# Patient Record
Sex: Male | Born: 1935 | Race: White | Hispanic: No | Marital: Married | Smoking: Former smoker
Health system: Southern US, Community
[De-identification: ages and names within clinical notes are randomized; demographics above are authoritative.]

## PROBLEM LIST (undated history)

## (undated) ENCOUNTER — Emergency Department (HOSPITAL_COMMUNITY): Admission: EM | Payer: Medicare Other | Source: Home / Self Care

## (undated) DIAGNOSIS — Z860100 Personal history of colon polyps, unspecified: Secondary | ICD-10-CM

## (undated) DIAGNOSIS — K219 Gastro-esophageal reflux disease without esophagitis: Secondary | ICD-10-CM

## (undated) DIAGNOSIS — F419 Anxiety disorder, unspecified: Secondary | ICD-10-CM

## (undated) DIAGNOSIS — I10 Essential (primary) hypertension: Secondary | ICD-10-CM

## (undated) DIAGNOSIS — J189 Pneumonia, unspecified organism: Secondary | ICD-10-CM

## (undated) DIAGNOSIS — F329 Major depressive disorder, single episode, unspecified: Secondary | ICD-10-CM

## (undated) DIAGNOSIS — Z973 Presence of spectacles and contact lenses: Secondary | ICD-10-CM

## (undated) DIAGNOSIS — N135 Crossing vessel and stricture of ureter without hydronephrosis: Secondary | ICD-10-CM

## (undated) DIAGNOSIS — J42 Unspecified chronic bronchitis: Secondary | ICD-10-CM

## (undated) DIAGNOSIS — M4800 Spinal stenosis, site unspecified: Secondary | ICD-10-CM

## (undated) DIAGNOSIS — H9190 Unspecified hearing loss, unspecified ear: Secondary | ICD-10-CM

## (undated) DIAGNOSIS — G629 Polyneuropathy, unspecified: Secondary | ICD-10-CM

## (undated) DIAGNOSIS — R0600 Dyspnea, unspecified: Secondary | ICD-10-CM

## (undated) DIAGNOSIS — Z9289 Personal history of other medical treatment: Secondary | ICD-10-CM

## (undated) DIAGNOSIS — R9431 Abnormal electrocardiogram [ECG] [EKG]: Secondary | ICD-10-CM

## (undated) DIAGNOSIS — K251 Acute gastric ulcer with perforation: Secondary | ICD-10-CM

## (undated) DIAGNOSIS — I5189 Other ill-defined heart diseases: Secondary | ICD-10-CM

## (undated) DIAGNOSIS — C61 Malignant neoplasm of prostate: Secondary | ICD-10-CM

## (undated) DIAGNOSIS — I351 Nonrheumatic aortic (valve) insufficiency: Secondary | ICD-10-CM

## (undated) DIAGNOSIS — I639 Cerebral infarction, unspecified: Secondary | ICD-10-CM

## (undated) DIAGNOSIS — N390 Urinary tract infection, site not specified: Secondary | ICD-10-CM

## (undated) DIAGNOSIS — F32A Depression, unspecified: Secondary | ICD-10-CM

## (undated) DIAGNOSIS — K922 Gastrointestinal hemorrhage, unspecified: Secondary | ICD-10-CM

## (undated) DIAGNOSIS — N400 Enlarged prostate without lower urinary tract symptoms: Secondary | ICD-10-CM

## (undated) DIAGNOSIS — M1990 Unspecified osteoarthritis, unspecified site: Secondary | ICD-10-CM

## (undated) DIAGNOSIS — I519 Heart disease, unspecified: Secondary | ICD-10-CM

## (undated) DIAGNOSIS — I071 Rheumatic tricuspid insufficiency: Secondary | ICD-10-CM

## (undated) DIAGNOSIS — C801 Malignant (primary) neoplasm, unspecified: Secondary | ICD-10-CM

## (undated) DIAGNOSIS — C7951 Secondary malignant neoplasm of bone: Secondary | ICD-10-CM

## (undated) DIAGNOSIS — N1330 Unspecified hydronephrosis: Secondary | ICD-10-CM

## (undated) DIAGNOSIS — I517 Cardiomegaly: Secondary | ICD-10-CM

## (undated) DIAGNOSIS — R600 Localized edema: Secondary | ICD-10-CM

## (undated) DIAGNOSIS — R32 Unspecified urinary incontinence: Secondary | ICD-10-CM

## (undated) HISTORY — PX: GASTRIC ROUX-EN-Y: SHX5262

## (undated) HISTORY — PX: TONSILLECTOMY: SUR1361

## (undated) HISTORY — PX: HERNIA REPAIR: SHX51

## (undated) HISTORY — PX: APPENDECTOMY: SHX54

## (undated) HISTORY — PX: ABDOMINAL SURGERY: SHX537

## (undated) HISTORY — PX: VAGOTOMY: SUR1431

## (undated) HISTORY — PX: TRANSURETHRAL RESECTION OF PROSTATE: SHX73

---

## 1972-08-14 DIAGNOSIS — K251 Acute gastric ulcer with perforation: Secondary | ICD-10-CM

## 1972-08-14 HISTORY — DX: Acute gastric ulcer with perforation: K25.1

## 1998-05-13 ENCOUNTER — Other Ambulatory Visit: Admission: RE | Admit: 1998-05-13 | Discharge: 1998-05-13 | Payer: Self-pay | Admitting: Urology

## 1998-05-20 ENCOUNTER — Ambulatory Visit (HOSPITAL_COMMUNITY): Admission: RE | Admit: 1998-05-20 | Discharge: 1998-05-20 | Payer: Self-pay | Admitting: Gastroenterology

## 1999-02-22 ENCOUNTER — Encounter (HOSPITAL_BASED_OUTPATIENT_CLINIC_OR_DEPARTMENT_OTHER): Payer: Self-pay | Admitting: General Surgery

## 1999-02-24 ENCOUNTER — Ambulatory Visit (HOSPITAL_COMMUNITY): Admission: RE | Admit: 1999-02-24 | Discharge: 1999-02-24 | Payer: Self-pay | Admitting: General Surgery

## 1999-11-28 ENCOUNTER — Ambulatory Visit (HOSPITAL_COMMUNITY): Admission: RE | Admit: 1999-11-28 | Discharge: 1999-11-28 | Payer: Self-pay | Admitting: Internal Medicine

## 2000-09-03 ENCOUNTER — Encounter: Admission: RE | Admit: 2000-09-03 | Discharge: 2000-09-03 | Payer: Self-pay | Admitting: Gastroenterology

## 2000-09-03 ENCOUNTER — Encounter: Payer: Self-pay | Admitting: Gastroenterology

## 2002-06-07 ENCOUNTER — Ambulatory Visit (HOSPITAL_COMMUNITY): Admission: RE | Admit: 2002-06-07 | Discharge: 2002-06-07 | Payer: Self-pay | Admitting: Gastroenterology

## 2002-06-25 ENCOUNTER — Inpatient Hospital Stay (HOSPITAL_COMMUNITY): Admission: EM | Admit: 2002-06-25 | Discharge: 2002-06-26 | Payer: Self-pay | Admitting: Emergency Medicine

## 2002-06-26 ENCOUNTER — Encounter: Payer: Self-pay | Admitting: Gastroenterology

## 2010-01-14 ENCOUNTER — Encounter (INDEPENDENT_AMBULATORY_CARE_PROVIDER_SITE_OTHER): Payer: Self-pay | Admitting: Urology

## 2010-01-14 ENCOUNTER — Observation Stay (HOSPITAL_COMMUNITY): Admission: RE | Admit: 2010-01-14 | Discharge: 2010-01-16 | Payer: Self-pay | Admitting: Urology

## 2010-10-31 LAB — CBC
HCT: 43.6 % (ref 39.0–52.0)
Hemoglobin: 14.2 g/dL (ref 13.0–17.0)
Hemoglobin: 14.4 g/dL (ref 13.0–17.0)
Hemoglobin: 16 g/dL (ref 13.0–17.0)
MCHC: 33 g/dL (ref 30.0–36.0)
MCV: 87.4 fL (ref 78.0–100.0)
MCV: 86.4 fL (ref 78.0–100.0)
Platelets: 166 10*3/uL (ref 150–400)
RBC: 4.8 MIL/uL (ref 4.22–5.81)
RBC: 4.98 MIL/uL (ref 4.22–5.81)
RBC: 5.55 MIL/uL (ref 4.22–5.81)
RDW: 16 % — ABNORMAL HIGH (ref 11.5–15.5)
RDW: 16.1 % — ABNORMAL HIGH (ref 11.5–15.5)
WBC: 8.1 10*3/uL (ref 4.0–10.5)
WBC: 8.7 10*3/uL (ref 4.0–10.5)
WBC: 8.1 10*3/uL (ref 4.0–10.5)

## 2010-10-31 LAB — COMPREHENSIVE METABOLIC PANEL
ALT: 14 U/L (ref 0–53)
AST: 27 U/L (ref 0–37)
CO2: 31 mEq/L (ref 19–32)
Chloride: 101 mEq/L (ref 96–112)
Creatinine, Ser: 1.35 mg/dL (ref 0.4–1.5)
GFR calc Af Amer: >60 mL/min (ref >60)
GFR calc non Af Amer: 51 mL/min — ABNORMAL LOW (ref >60)
Sodium: 142 mEq/L (ref 135–145)
Total Bilirubin: 0.5 mg/dL (ref 0.3–1.2)

## 2010-10-31 LAB — URINALYSIS, ROUTINE W REFLEX MICROSCOPIC
Hgb urine dipstick: NEGATIVE
Specific Gravity, Urine: 1.007 (ref 1.005–1.030)
Urobilinogen, UA: 0.2 mg/dL (ref 0.0–1.0)
pH: 7 (ref 5.0–8.0)

## 2010-10-31 LAB — BASIC METABOLIC PANEL
BUN: 12 mg/dL (ref 6–23)
CO2: 29 mEq/L (ref 19–32)
Calcium: 8.4 mg/dL (ref 8.4–10.5)
Chloride: 106 mEq/L (ref 96–112)
Creatinine, Ser: 1.17 mg/dL (ref 0.4–1.5)
GFR calc Af Amer: >60 mL/min (ref >60)
GFR calc non Af Amer: >60 mL/min (ref >60)
Glucose, Bld: 110 mg/dL — ABNORMAL HIGH (ref 70–99)
Potassium: 4.2 mEq/L (ref 3.5–5.1)
Sodium: 139 mEq/L (ref 135–145)

## 2010-12-30 NOTE — Op Note (Signed)
   NAME:  Steven Cain, SKOW                         ACCOUNT NO.:  [id number]   MEDICAL RECORD NO.:  [id number]                   PATIENT TYPE:  AMB   LOCATION:  ENDO                                 FACILITY:  [facility]   PHYSICIAN:  Anselmo Rod, M.D.               DATE OF BIRTH:  08/21/1930   DATE OF PROCEDURE:  06/07/2002  DATE OF DISCHARGE:                                 OPERATIVE REPORT   PROCEDURE:  Esophagogastroduodenoscopy.   ENDOSCOPIST:  Anselmo Rod, M.D.   INSTRUMENT USED:  Olympus video panendoscope.   INDICATION FOR PROCEDURE:  A 77 year old white male with a history of  hematemesis.  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  Rule out Mallory-Weiss tear, ulcer disease, etc.   PREPROCEDURE PREPARATION:  Informed consent was procured from the patient.  The patient was fasted for eight hours prior to the procedure.  A CBC and  BMET, hepatic functional panel, and coags were checked prior to the  procedure as well.  Hemoglobin was stable at 11.5 g/dl.  PT, PTT, and INR  were normal.   DESCRIPTION OF PROCEDURE:  The patient was placed in the left lateral  decubitus position and sedated with 75 mcg of fentanyl and 4 mg of Versed  intravenously.  Once the patient was adequately sedate and maintained on low-  flow oxygen and continuous cardiac monitoring, the Olympus video  panendoscope was advanced through the mouthpiece, over the tongue, into the  esophagus under direct vision.  The entire esophagus appeared normal with no  evidence of ring, stricture, masses, esophagitis, or Barrett's mucosa.  The  scope was then advanced into the stomach.  There was a Mallory-Weiss tear  seen at the GE junction.  It was not bleeding at the time of examination.  The patient had hyperemic-appearing gastric mucosa consistent with Billroth  II surgery.  Both the limbs of small bowel appeared normal.  No ulcers,  erosions, masses, or polyps were seen.   IMPRESSION:  1. Normal-appearing esophagus.  2. Mallory-Weiss tear seen at gastroesophageal junction with no evidence of     fresh bleeding.  3. Hyperemic gastric mucosa consistent with Billroth II surgery.  4. Normal-appearing limbs of small bowel.   RECOMMENDATIONS:  1. Prevacid 30 mg one p.o. q.d.  2. Avoid all nonsteroidals, including aspirin, for the next four to five     weeks.  3. Outpatient follow-up in the next two weeks or earlier if need be.                                               Anselmo Rod, M.D.    JNM/MEDQ  D:  06/07/2002  T:  06/09/2002  Job:  [id number]

## 2010-12-30 NOTE — Op Note (Signed)
NAME:  Steven Cain, Steven Cain                         ACCOUNT NO.:  [id number]   MEDICAL RECORD NO.:  [id number]                   PATIENT TYPE:  INP   LOCATION:  5730                                 FACILITY:  [facility]   PHYSICIAN:  Anselmo Rod, M.D.               DATE OF BIRTH:  14-Mar-1931   DATE OF PROCEDURE:  06/26/2002  DATE OF DISCHARGE:                                 OPERATIVE REPORT   PROCEDURE:  Screening colonoscopy.   ENDOSCOPIST:  Anselmo Rod, M.D.   INSTRUMENT USED:  Olympus video colonoscope (pediatric adjustable  colonoscope changed to an adult colonoscope).   INDICATION FOR PROCEDURE:  Severe anemia with a drop in hemoglobin from 11.5  to 7 g/dl and history of rectal bleeding that has been ongoing for four  days.  Rule out colonic polyps, masses, etc.   PREPROCEDURE PREPARATION:  Informed consent was procured from the patient.  The patient had fasted for eight hours prior to the procedure and prepped  with a gallon of NuLytely the night prior to the procedure.   PREPROCEDURE PHYSICAL:  VITAL SIGNS:  The patient had stable vital signs.  NECK:  Supple.  CHEST:  Clear to auscultation.  S1, S2 regular.  ABDOMEN:  Soft with normal bowel sounds.   DESCRIPTION OF PROCEDURE:  The patient was placed in the left lateral  decubitus position and sedated with 20 mg of Demerol and 6 mg of Versed  intravenously.  Once the patient was adequately sedate and maintained on low-  flow oxygen and continuous cardiac monitoring, the Olympus video colonoscope  was advanced from the rectum to the hepatic flexure with difficulty.  The  pediatric adjustable colonoscope could not be advanced up to the cecum, and  therefore this was withdrawn and switched to an adult colonoscope, which was  used to complete the colonoscopy.  The procedure was complete up to the  cecum.  The appendiceal orifice and the ileocecal valve were clearly  visualized and photographed.  No masses, polyps, erosions,  or ulcerations  were seen, no AVMs were identified.  There was some residual stool in the  colon.  Multiple washes were done.  The prep was otherwise adequate.  There  were a very few early areas in the left colon indicating formation of  diverticula.  This was in the form of dimpling of the colonic mucosa.  Small, nonbleeding internal hemorrhoids were seen on retroflexion.  The  patient tolerated the procedure well without complication.   IMPRESSION:  Normal colonoscopy up to the cecum except for a few early left-  sided diverticula.  No masses or polyps seen.   RECOMMENDATIONS:  1. An SBFT is planned for the patient.  2. Serial CBCs will be continued and iron supplementation is advised.   Further recommendations to be made in follow-up.  The patient is advised to  stay off of all nonsteroidals including aspirin for  fear of re-bleed in the  near future.                                                 Anselmo Rod, M.D.    JNM/MEDQ  D:  06/26/2002  T:  06/26/2002  Job:  [id number]

## 2010-12-30 NOTE — Op Note (Signed)
   NAME:  Steven Cain, Steven Cain                         ACCOUNT NO.:  [id number]   MEDICAL RECORD NO.:  [id number]                   PATIENT TYPE:  INP   LOCATION:  5730                                 FACILITY:  [facility]   PHYSICIAN:  Anselmo Rod, M.D.               DATE OF BIRTH:  03-28-1931   DATE OF PROCEDURE:  06/25/2002  DATE OF DISCHARGE:  06/26/2002                                 OPERATIVE REPORT   PROCEDURE:  Flexible sigmoidoscopy to 70 cm.   ENDOSCOPIST:  Charna Elizabeth, M.D.   INSTRUMENT USED:  Pediatric adjustable Olympus colonoscope.   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  Follow up flex sigmoidoscopy is being done to rule out polyps, masses, etc.   PREPROCEDURE PREPARATION:  Informed consent was obtained from the patient.  The patient was fasted for eight hours prior to the procedure and prepped  with two Fleets enemas the morning of the procedure.   PREPROCEDURE PHYSICAL:  Patient with stable vital signs.  Neck supple.  Chest clear to auscultation.  S1 and S2 regular.  Abdomen soft with normal  bowel sounds.  Well healed surgical scars from previous surgery.   DESCRIPTION OF PROCEDURE:  The patient was placed in the left lateral  decubitus position, sedated with 5 mg Versed intravenously.  Once the  patient was adequately sedated, maintained on low flow oxygen, continuous  cardiac monitoring, the Olympus video colonoscope was advanced from the  rectum to 70 cm without difficulty.  The patient had an excellent prep.  A  healthy anastomosis is noted at 10 cm.  No masses, polyps, erosions, or  diverticula were noted.   IMPRESSION:  Healthy anastomosis at 10 cm, no evidence of polyps, masses,  erosions, or diverticula seen.   RECOMMENDATIONS:  1. Repeat colonoscopy recommended in two years unless the patient develops     any abnormal symptoms in the interim.  2.  High fiber diet with liberal fluid intake has been recommended.  3. Outpatient follow up on a p.r.n. basis.                                               Anselmo Rod, M.D.    JNM/MEDQ  D:  06/27/2002  T:  06/27/2002  Job:  [id number]   cc:   Adolph Pollack, M.D.  Fax: [phone]

## 2010-12-30 NOTE — Op Note (Signed)
   NAME:  Steven Cain, Steven Cain                         ACCOUNT NO.:  [id number]   MEDICAL RECORD NO.:  [id number]                   PATIENT TYPE:  INP   LOCATION:  5730                                 FACILITY:  [facility]   PHYSICIAN:  Anselmo Rod, M.D.               DATE OF BIRTH:  08-03-1931   DATE OF PROCEDURE:  06/25/2002  DATE OF DISCHARGE:                                 OPERATIVE REPORT   PROCEDURE PERFORMED:  Esophagogastroduodenoscopy.   ENDOSCOPIST:  Charna Elizabeth, M.D.   INSTRUMENT USED:  Olympus video panendoscope.   INDICATIONS FOR PROCEDURE:  Rectal bleeding with anemia and a drop in  hemoglobin from 11.5 to 70 gm/dl over two weeks.  Rule out arteriovenous  malformations, masses, ulcers, etc.   PREPROCEDURE PREPARATION:  Informed consent was procured from the patient.  The patient was fasted for eight hours prior to the procedure and received  two units packed red blood cells prior to the procedure.   PREPROCEDURE PHYSICAL:  The patient had stable vital signs.  Neck supple,  chest clear to auscultation.  S1, S2 regular.  Abdomen soft with normal  bowel sounds.   DESCRIPTION OF PROCEDURE:  The patient was placed in the left lateral  decubitus position and sedated with 80 mg of Demerol and 9 mg of Versed  intravenously.  Once the patient was adequately sedated and maintained on  low-flow oxygen and continuous cardiac monitoring, the Olympus video  panendoscope was advanced through the mouth piece over the tongue into the  esophagus under direct vision.  The entire esophagus appeared normal with no  evidence of ring, stricture, masses, esophagitis or Barrett's mucosa.  The  scope was then advanced to the stomach.  There was evidence of Billroth II  surgery with hyperemic gastric mucosa.  Small  hiatal hernia was seen on  retroflexion.  There was no evidence of a Mallory-Weiss tear that was seen  about two weeks ago.  No ulcers, masses or polyps were noted.  Small bowel  limbs appeared normal.   IMPRESSION:  Status post  Billroth II surgery.  Otherwise normal  esophagogastroduodenoscopy.   RECOMMENDATIONS:  1. Proceed with a colonoscopy tomorrow.  2. Continue serial CBCs.  3. Transfuse as needed to keep the hematocrit above 25%.                                                 Anselmo Rod, M.D.    JNM/MEDQ  D:  06/26/2002  T:  06/26/2002  Job:  [id number]

## 2012-01-03 ENCOUNTER — Ambulatory Visit (INDEPENDENT_AMBULATORY_CARE_PROVIDER_SITE_OTHER): Payer: 59 | Admitting: Family Medicine

## 2012-01-03 VITALS — BP 159/79 | HR 93 | Temp 97.4°F | Resp 16 | Ht 65.75 in | Wt 179.2 lb

## 2012-01-03 DIAGNOSIS — I10 Essential (primary) hypertension: Secondary | ICD-10-CM

## 2012-01-03 DIAGNOSIS — G4733 Obstructive sleep apnea (adult) (pediatric): Secondary | ICD-10-CM

## 2012-01-03 DIAGNOSIS — G4730 Sleep apnea, unspecified: Secondary | ICD-10-CM

## 2012-01-03 LAB — BASIC METABOLIC PANEL
CO2: 31 mEq/L (ref 19–32)
Calcium: 9.7 mg/dL (ref 8.4–10.5)
Creat: 1.28 mg/dL (ref 0.50–1.35)
Glucose, Bld: 104 mg/dL — ABNORMAL HIGH (ref 70–99)

## 2012-01-03 MED ORDER — HYDROCHLOROTHIAZIDE 12.5 MG PO CAPS: 12.5000 mg | ORAL_CAPSULE | Freq: Every day | ORAL | Status: DC

## 2012-01-03 NOTE — Patient Instructions (Signed)
Start hctz - 1 per day. Watch for dizziness - especially when first standing.  Follow up in a few weeks to discuss your meds and sleep study. Keep a record of your blood pressures outside of the office and bring them to the next office visit. Your should receive a call or letter about your lab results within the next week to 10 days.   Return to the clinic or go to the nearest emergency room if any of your symptoms worsen or new symptoms occur.

## 2012-01-03 NOTE — Progress Notes (Signed)
  Subjective:    Patient ID: Steven Cain, male    DOB: 1930/12/28, 78 y.o.   MRN: [id number]  HPI Steven Cain is a 78 y.o. male Elevated blood pressure - went to oral surgeon yesterday - BP 170/110  No hx htn meds.   Does choke with sleeping at times.  - dtr and wife thinks he has OSA. Also has   No primary care provider.    Retired MD, internal medicine.  hewing tobacco.  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  Review of Systems  Constitutional: Negative for fatigue and unexpected weight change.  Eyes: Negative for visual disturbance.  Respiratory: Negative for cough, chest tightness and shortness of breath.   Cardiovascular: Negative for chest pain, palpitations and leg swelling.  Gastrointestinal: Negative for abdominal pain and blood in stool.  Neurological: Negative for dizziness, light-headedness and headaches.       Hx longstanding balance difficulties, muscle weakness over time.  Less activity in past few years.        Objective:   Physical Exam  Constitutional: He is oriented to person, place, and time. He appears well-developed and well-nourished.  HENT:  Head: Normocephalic and atraumatic.  Eyes: EOM are normal. Pupils are equal, round, and reactive to light.  Neck: No JVD present. Carotid bruit is not present.  Cardiovascular: Normal rate, regular rhythm and normal heart sounds.   No murmur heard. Pulmonary/Chest: Effort normal and breath sounds normal. He has no rales.  Musculoskeletal: He exhibits no edema.  Neurological: He is alert and oriented to person, place, and time.  Skin: Skin is warm and dry.  Psychiatric: He has a normal mood and affect.          Assessment & Plan:  Steven Cain is a 78 y.o. male  HTN - new dx, with likely OSA by sx's. Check BMP, start hctz 12.5mg  qd.  SED, RTC precautions. schedule sleep study, and recheck next few weeks to establish care, and discuss other concerns, including generalized fatigue, proximal weakness and balance  issues longstanding.  Hx of spinal stenosis that may be a contributor.

## 2012-01-08 ENCOUNTER — Telehealth: Payer: Self-pay

## 2012-01-08 NOTE — Telephone Encounter (Signed)
Steven Cain STATES SHE TOOK HER DAD'S PRESSURE THIS MORNING BEFORE HIS MEDICINE AND IT WAS 160/110 AFTER HIS MEDICINE IT WAS 150/100. DR Neva Seat HAD PUT HIM ON A LOWER DOSAGE AND SHE WAS WONDERING IF HE NEEDED TO HAVE A HIGHER DOSAGE OF HIS BP MEDS PLEASE CALL [phone]    Steven Cain STUDIED UNDER CHELLE FOR HER INTERN

## 2012-01-09 NOTE — Telephone Encounter (Signed)
Spoke with patients daughter, she states that patients blood pressure has been running high, and they felt that the dose of HCTZ 12.5mg  was too low.  They have since doubled the dose (25mg  daily) and blood pressures doing much better-- running around 132/82.  Abigail wanted to make sure that this was ok with MD--patient feeling much better and not symptomatic--will recheck here in 2 weeks as planned, and advised to keep a log of bp readings to bring into OV.  Ok with plan?

## 2012-01-09 NOTE — Telephone Encounter (Signed)
Yes, good plan.  Well done!

## 2012-01-10 NOTE — Telephone Encounter (Signed)
To MD, FYI

## 2012-01-15 ENCOUNTER — Ambulatory Visit (INDEPENDENT_AMBULATORY_CARE_PROVIDER_SITE_OTHER): Payer: 59 | Admitting: Family Medicine

## 2012-01-15 VITALS — BP 145/78 | HR 89 | Temp 97.9°F | Resp 18 | Ht 66.5 in | Wt 177.4 lb

## 2012-01-15 DIAGNOSIS — I10 Essential (primary) hypertension: Secondary | ICD-10-CM

## 2012-01-15 DIAGNOSIS — F419 Anxiety disorder, unspecified: Secondary | ICD-10-CM

## 2012-01-15 MED ORDER — HYDROCHLOROTHIAZIDE 25 MG PO TABS: 25.0000 mg | ORAL_TABLET | Freq: Every day | ORAL | Status: AC

## 2012-01-15 MED ORDER — PAROXETINE HCL 40 MG PO TABS: 40.0000 mg | ORAL_TABLET | ORAL | Status: DC

## 2012-01-15 NOTE — Progress Notes (Signed)
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  No chest pain, shortness of breath. Also has anxiety, treated adequately with paxil  O: NAD HEENT: unremarkable Chest: clear Heart:  Reg, no murmur Abd:  Soft, no HSM Ext:  No edema. Results for orders placed in visit on 01/03/12  BASIC METABOLIC PANEL      Component Value Range   Sodium 139  135 - 145 (mEq/L)   Potassium 5.3  3.5 - 5.3 (mEq/L)   Chloride 101  96 - 112 (mEq/L)   CO2 31  19 - 32 (mEq/L)   Glucose, Bld 104 (*) 70 - 99 (mg/dL)   BUN 10  6 - 23 (mg/dL)   Creat 9.60  4.54 - 0.98 (mg/dL)   Calcium 9.7  8.4 - 11.9 (mg/dL)   Assessment:  Reasonable bp control on 25 mg  P:   1. Hypertension  hydrochlorothiazide (HYDRODIURIL) 25 MG tablet  2. Anxiety  PARoxetine (PAXIL) 40 MG tablet

## 2012-01-15 NOTE — Patient Instructions (Signed)

## 2012-01-25 ENCOUNTER — Encounter (HOSPITAL_BASED_OUTPATIENT_CLINIC_OR_DEPARTMENT_OTHER): Payer: Self-pay

## 2013-04-24 ENCOUNTER — Emergency Department (HOSPITAL_COMMUNITY)
Admission: EM | Admit: 2013-04-24 | Discharge: 2013-04-24 | Disposition: A | Discharge status: Home / Self Care | Payer: No Typology Code available for payment source | Attending: Emergency Medicine | Admitting: Emergency Medicine

## 2013-04-24 ENCOUNTER — Encounter (HOSPITAL_COMMUNITY): Payer: Self-pay | Admitting: Emergency Medicine

## 2013-04-24 DIAGNOSIS — I10 Essential (primary) hypertension: Secondary | ICD-10-CM | POA: Insufficient documentation

## 2013-04-24 DIAGNOSIS — N390 Urinary tract infection, site not specified: Secondary | ICD-10-CM | POA: Diagnosis present

## 2013-04-24 DIAGNOSIS — Z7982 Long term (current) use of aspirin: Secondary | ICD-10-CM | POA: Insufficient documentation

## 2013-04-24 DIAGNOSIS — Z87891 Personal history of nicotine dependence: Secondary | ICD-10-CM | POA: Insufficient documentation

## 2013-04-24 DIAGNOSIS — Z79899 Other long term (current) drug therapy: Secondary | ICD-10-CM | POA: Insufficient documentation

## 2013-04-24 DIAGNOSIS — R6883 Chills (without fever): Secondary | ICD-10-CM | POA: Diagnosis present

## 2013-04-24 HISTORY — DX: Essential (primary) hypertension: I10

## 2013-04-24 LAB — URINALYSIS, ROUTINE W REFLEX MICROSCOPIC
Bilirubin Urine: NEGATIVE
Ketones, ur: NEGATIVE mg/dL
Nitrite: NEGATIVE
Urobilinogen, UA: 1 mg/dL (ref 0.0–1.0)

## 2013-04-24 LAB — URINE MICROSCOPIC-ADD ON

## 2013-04-24 MED ORDER — CEFPODOXIME PROXETIL 200 MG PO TABS: 200.0000 mg | ORAL_TABLET | Freq: Two times a day (BID) | ORAL | Status: DC

## 2013-04-24 MED ORDER — SODIUM CHLORIDE 0.9 % IV BOLUS (SEPSIS): 1000.0000 mL | INTRAVENOUS | Status: DC

## 2013-04-24 MED ORDER — DEXTROSE 5 % IV SOLN
1.0000 g | Freq: Once | INTRAVENOUS | Status: AC
  Administered 2013-04-24: 1 g via INTRAVENOUS
  Filled 2013-04-24: qty 10

## 2013-04-24 NOTE — ED Notes (Signed)
Pt c/o chills x 3 days and dysuria

## 2013-04-24 NOTE — ED Provider Notes (Signed)
CSN: [id number]     Arrival date & time 04/24/13  0947 History   First MD Initiated Contact with Patient 04/24/13 [phone]     Chief Complaint  Patient presents with  . Dysuria  . Chills   (Consider location/radiation/quality/duration/timing/severity/associated sxs/prior Treatment) Patient is a 79 y.o. male presenting with dysuria. The history is provided by the patient.  Dysuria This is a new problem. The current episode started 2 days ago. Episode frequency: intermittently. The problem has not changed since onset.Pertinent negatives include no chest pain, no abdominal pain, no headaches and no shortness of breath. Nothing aggravates the symptoms. Nothing relieves the symptoms. He has tried nothing for the symptoms. The treatment provided no relief.    Past Medical History  Diagnosis Date  . Hypertension    History reviewed. No pertinent past surgical history. History reviewed. No pertinent family history. History  Substance Use Topics  . Smoking status: Former Games developer  . Smokeless tobacco: Not on file  . Alcohol Use: Not on file    Review of Systems  Constitutional: Positive for chills. Negative for fever.  HENT: Negative for rhinorrhea, drooling and neck pain.   Eyes: Negative for pain.  Respiratory: Negative for cough and shortness of breath.   Cardiovascular: Negative for chest pain and leg swelling.  Gastrointestinal: Negative for nausea, vomiting, abdominal pain and diarrhea.  Genitourinary: Positive for dysuria. Negative for hematuria.  Musculoskeletal: Negative for gait problem.  Skin: Negative for color change.  Neurological: Positive for weakness (generalized). Negative for numbness and headaches.  Hematological: Negative for adenopathy.  Psychiatric/Behavioral: Negative for behavioral problems.  All other systems reviewed and are negative.    Allergies  Review of patient's allergies indicates no known allergies.  Home Medications   Current Outpatient Rx  Name   Route  Sig  Dispense  Refill  . Ascorbic Acid (VITAMIN C) 100 MG tablet   Oral   Take 100 mg by mouth daily.         Marland Kitchen aspirin 325 MG tablet   Oral   Take 325 mg by mouth daily.         Marland Kitchen co-enzyme Q-10 30 MG capsule   Oral   Take 30 mg by mouth 3 (three) times daily.         . fish oil-omega-3 fatty acids 1000 MG capsule   Oral   Take 2 g by mouth daily.         Marland Kitchen PARoxetine (PAXIL) 40 MG tablet   Oral   Take 1 tablet (40 mg total) by mouth every morning.   30 tablet   11   . saw palmetto 160 MG capsule   Oral   Take 160 mg by mouth 2 (two) times daily.         . vitamin E 100 UNIT capsule   Oral   Take 100 Units by mouth daily.          BP 128/68  Pulse 87  Temp(Src) 98.5 F (36.9 C) (Oral)  Resp 18  SpO2 97% Physical Exam  Nursing note and vitals reviewed. Constitutional: He is oriented to person, place, and time. He appears well-developed and well-nourished.  HENT:  Head: Normocephalic and atraumatic.  Right Ear: External ear normal.  Left Ear: External ear normal.  Nose: Nose normal.  Mouth/Throat: Oropharynx is clear and moist. No oropharyngeal exudate.  Eyes: Conjunctivae and EOM are normal. Pupils are equal, round, and reactive to light.  Neck: Normal range of motion. Neck  supple.  Cardiovascular: Normal rate, regular rhythm, normal heart sounds and intact distal pulses.  Exam reveals no gallop and no friction rub.   No murmur heard. Pulmonary/Chest: Effort normal and breath sounds normal. No respiratory distress. He has no wheezes.  Abdominal: Soft. Bowel sounds are normal. He exhibits no distension. There is no tenderness. There is no rebound and no guarding.  Musculoskeletal: Normal range of motion. He exhibits no edema and no tenderness.  Neurological: He is alert and oriented to person, place, and time.  Skin: Skin is warm and dry.  Psychiatric: He has a normal mood and affect. His behavior is normal.    ED Course  Procedures  (including critical care time) Labs Review Labs Reviewed  URINALYSIS, ROUTINE W REFLEX MICROSCOPIC - Abnormal; Notable for the following:    APPearance CLOUDY (*)    Hgb urine dipstick MODERATE (*)    Leukocytes, UA LARGE (*)    All other components within normal limits  URINE CULTURE  URINE MICROSCOPIC-ADD ON   Imaging Review No results found.  MDM   1. UTI (lower urinary tract infection)   2. Chills    10:17 AM 79 y.o. male who presents with intermittent dysuria, chills, and generalized weakness for approximately 2-3 days. The patient denies any fevers, vomiting, diarrhea. He is afebrile and vital signs are unremarkable here. He appears well on exam. Will get urinalysis.  Found to have UTI. Will give rocephin and IVF bolus.   1:03 PM: Pt continues to appear well, up walking around and ready to leave.  I have discussed the diagnosis/risks/treatment options with the patient and believe the pt to be eligible for discharge home to follow-up with pcp as needed. We also discussed returning to the ED immediately if new or worsening sx occur. We discussed the sx which are most concerning (e.g., worsening chills, fever, body aches, vomiting, pain) that necessitate immediate return. Any new prescriptions provided to the patient are listed below.  Discharge Medication List as of 04/24/2013  1:04 PM    START taking these medications   Details  cefpodoxime (VANTIN) 200 MG tablet Take 1 tablet (200 mg total) by mouth 2 (two) times daily., Starting 04/24/2013, Until Discontinued, Print         Junius Argyle, MD 04/24/13 2043

## 2013-04-26 LAB — URINE CULTURE

## 2013-04-27 ENCOUNTER — Telehealth (HOSPITAL_COMMUNITY): Payer: Self-pay | Admitting: Emergency Medicine

## 2013-04-27 NOTE — ED Notes (Signed)
Post ED Visit - Positive Culture Follow-up  Culture report reviewed by antimicrobial stewardship pharmacist: []  Wes Dulaney, Pharm.D., BCPS [x]  Celedonio Miyamoto, Pharm.D., BCPS []  Georgina Pillion, [address].D., BCPS []  [place], [place].D., BCPS, AAHIVP []  Estella Husk, Pharm.D., BCPS, AAHIVP  Positive urine culture Treated with Cefpofoxime, organism sensitive to the same and no further patient follow-up is required at this time.  Kylie A Holland 04/27/2013, 9:48 AM

## 2013-06-18 ENCOUNTER — Ambulatory Visit: Payer: 59 | Admitting: Podiatrist

## 2013-09-18 ENCOUNTER — Ambulatory Visit (INDEPENDENT_AMBULATORY_CARE_PROVIDER_SITE_OTHER): Payer: PRIVATE HEALTH INSURANCE | Admitting: Podiatrist

## 2013-09-18 ENCOUNTER — Encounter: Payer: Self-pay | Admitting: Podiatrist

## 2013-09-18 VITALS — BP 138/72 | HR 92 | Resp 12

## 2013-09-18 DIAGNOSIS — M79609 Pain in unspecified limb: Secondary | ICD-10-CM

## 2013-09-18 DIAGNOSIS — B351 Tinea unguium: Secondary | ICD-10-CM

## 2013-09-18 NOTE — Progress Notes (Signed)
HPI:  Patient presents today for follow up of foot and nail care. Denies any new complaints today.  Objective:  Patients chart is reviewed.  Vascular status reveals pedal pulses noted at 2 out of 4 .  Neurological sensation is intact bilateral feet.   Patients nails are thickened, discolored, distrophic, friable and brittle with yellow-brown discoloration. Patient subjectively relates they are painful with shoes and with ambulation of bilateral feet.  Assessment:  Symptomatic onychomycosis  Plan:  Discussed treatment options and alternatives.  The symptomatic toenails were debrided through manual an mechanical means without complication.  Return appointment recommended at routine intervals of 3 months    Trudie Buckler, DPM

## 2014-05-06 ENCOUNTER — Inpatient Hospital Stay (HOSPITAL_COMMUNITY): Payer: BC Managed Care – PPO | Admitting: Anesthesiology

## 2014-05-06 ENCOUNTER — Encounter (HOSPITAL_COMMUNITY): Payer: BC Managed Care – PPO | Admitting: Anesthesiology

## 2014-05-06 ENCOUNTER — Inpatient Hospital Stay (HOSPITAL_COMMUNITY)
Admission: EM | Admit: 2014-05-06 | Discharge: 2014-05-08 | DRG: 690 | Disposition: A | Discharge status: Home / Self Care | Payer: BC Managed Care – PPO | Attending: Internal Medicine | Admitting: Internal Medicine

## 2014-05-06 ENCOUNTER — Encounter (HOSPITAL_COMMUNITY): Payer: Self-pay | Admitting: Emergency Medicine

## 2014-05-06 ENCOUNTER — Emergency Department (HOSPITAL_COMMUNITY): Payer: BC Managed Care – PPO

## 2014-05-06 ENCOUNTER — Encounter (HOSPITAL_COMMUNITY)
Admission: EM | Disposition: A | Discharge status: Home / Self Care | Payer: Self-pay | Source: Home / Self Care | Attending: Internal Medicine

## 2014-05-06 DIAGNOSIS — R509 Fever, unspecified: Secondary | ICD-10-CM

## 2014-05-06 DIAGNOSIS — N2889 Other specified disorders of kidney and ureter: Secondary | ICD-10-CM | POA: Diagnosis present

## 2014-05-06 DIAGNOSIS — K5900 Constipation, unspecified: Secondary | ICD-10-CM | POA: Diagnosis present

## 2014-05-06 DIAGNOSIS — N183 Chronic kidney disease, stage 3 unspecified: Secondary | ICD-10-CM

## 2014-05-06 DIAGNOSIS — F17200 Nicotine dependence, unspecified, uncomplicated: Secondary | ICD-10-CM | POA: Diagnosis present

## 2014-05-06 DIAGNOSIS — R6883 Chills (without fever): Secondary | ICD-10-CM | POA: Diagnosis present

## 2014-05-06 DIAGNOSIS — Z79899 Other long term (current) drug therapy: Secondary | ICD-10-CM

## 2014-05-06 DIAGNOSIS — F3289 Other specified depressive episodes: Secondary | ICD-10-CM | POA: Diagnosis present

## 2014-05-06 DIAGNOSIS — D72829 Elevated white blood cell count, unspecified: Secondary | ICD-10-CM | POA: Diagnosis present

## 2014-05-06 DIAGNOSIS — N401 Enlarged prostate with lower urinary tract symptoms: Secondary | ICD-10-CM | POA: Diagnosis present

## 2014-05-06 DIAGNOSIS — N1330 Unspecified hydronephrosis: Secondary | ICD-10-CM | POA: Diagnosis not present

## 2014-05-06 DIAGNOSIS — N189 Chronic kidney disease, unspecified: Secondary | ICD-10-CM | POA: Diagnosis present

## 2014-05-06 DIAGNOSIS — F329 Major depressive disorder, single episode, unspecified: Secondary | ICD-10-CM | POA: Diagnosis present

## 2014-05-06 DIAGNOSIS — E291 Testicular hypofunction: Secondary | ICD-10-CM | POA: Diagnosis present

## 2014-05-06 DIAGNOSIS — N3000 Acute cystitis without hematuria: Secondary | ICD-10-CM

## 2014-05-06 DIAGNOSIS — N390 Urinary tract infection, site not specified: Secondary | ICD-10-CM

## 2014-05-06 DIAGNOSIS — Z7982 Long term (current) use of aspirin: Secondary | ICD-10-CM

## 2014-05-06 DIAGNOSIS — R109 Unspecified abdominal pain: Secondary | ICD-10-CM | POA: Diagnosis present

## 2014-05-06 DIAGNOSIS — N289 Disorder of kidney and ureter, unspecified: Secondary | ICD-10-CM | POA: Diagnosis present

## 2014-05-06 DIAGNOSIS — J42 Unspecified chronic bronchitis: Secondary | ICD-10-CM | POA: Diagnosis present

## 2014-05-06 DIAGNOSIS — N10 Acute tubulo-interstitial nephritis: Secondary | ICD-10-CM | POA: Diagnosis present

## 2014-05-06 DIAGNOSIS — N138 Other obstructive and reflux uropathy: Secondary | ICD-10-CM | POA: Diagnosis present

## 2014-05-06 DIAGNOSIS — I10 Essential (primary) hypertension: Secondary | ICD-10-CM | POA: Diagnosis not present

## 2014-05-06 HISTORY — PX: CYSTOSCOPY W/ URETERAL STENT PLACEMENT: SHX1429

## 2014-05-06 HISTORY — DX: Depression, unspecified: F32.A

## 2014-05-06 HISTORY — DX: Major depressive disorder, single episode, unspecified: F32.9

## 2014-05-06 LAB — CBC WITH DIFFERENTIAL/PLATELET
BASOS ABS: 0 10*3/uL (ref 0.0–0.1)
Basophils Relative: 0 % (ref 0–1)
Eosinophils Absolute: 0.1 10*3/uL (ref 0.0–0.7)
Eosinophils Relative: 1 % (ref 0–5)
HEMATOCRIT: 40.6 % (ref 39.0–52.0)
Hemoglobin: 13.7 g/dL (ref 13.0–17.0)
LYMPHS ABS: 1.1 10*3/uL (ref 0.7–4.0)
LYMPHS PCT: 6 % — AB (ref 12–46)
MCH: 29.4 pg (ref 26.0–34.0)
MCHC: 33.7 g/dL (ref 30.0–36.0)
MCV: 87.1 fL (ref 78.0–100.0)
MONO ABS: 1.9 10*3/uL — AB (ref 0.1–1.0)
Monocytes Relative: 11 % (ref 3–12)
NEUTROS ABS: 14.9 10*3/uL — AB (ref 1.7–7.7)
Neutrophils Relative %: 82 % — ABNORMAL HIGH (ref 43–77)
PLATELETS: 159 10*3/uL (ref 150–400)
RBC: 4.66 MIL/uL (ref 4.22–5.81)
RDW: 15 % (ref 11.5–15.5)
WBC: 18.1 10*3/uL — AB (ref 4.0–10.5)

## 2014-05-06 LAB — URINALYSIS, ROUTINE W REFLEX MICROSCOPIC
Bilirubin Urine: NEGATIVE
Glucose, UA: NEGATIVE mg/dL
KETONES UR: NEGATIVE mg/dL
NITRITE: NEGATIVE
PH: 5.5 (ref 5.0–8.0)
Protein, ur: 30 mg/dL — AB
SPECIFIC GRAVITY, URINE: 1.01 (ref 1.005–1.030)
Urobilinogen, UA: 0.2 mg/dL (ref 0.0–1.0)

## 2014-05-06 LAB — COMPREHENSIVE METABOLIC PANEL
ALT: 13 U/L (ref 0–53)
AST: 28 U/L (ref 0–37)
Albumin: 2.9 g/dL — ABNORMAL LOW (ref 3.5–5.2)
Alkaline Phosphatase: 77 U/L (ref 39–117)
Anion gap: 13 (ref 5–15)
BILIRUBIN TOTAL: 0.7 mg/dL (ref 0.3–1.2)
BUN: 25 mg/dL — ABNORMAL HIGH (ref 6–23)
CHLORIDE: 92 meq/L — AB (ref 96–112)
CO2: 23 meq/L (ref 19–32)
Calcium: 8.6 mg/dL (ref 8.4–10.5)
Creatinine, Ser: 1.96 mg/dL — ABNORMAL HIGH (ref 0.50–1.35)
GFR, EST AFRICAN AMERICAN: 35 mL/min — AB (ref >90)
GFR, EST NON AFRICAN AMERICAN: 30 mL/min — AB (ref >90)
GLUCOSE: 127 mg/dL — AB (ref 70–99)
Potassium: 4.8 mEq/L (ref 3.7–5.3)
SODIUM: 128 meq/L — AB (ref 137–147)
Total Protein: 6.9 g/dL (ref 6.0–8.3)

## 2014-05-06 LAB — I-STAT CG4 LACTIC ACID, ED: LACTIC ACID, VENOUS: 1.78 mmol/L (ref 0.5–2.2)

## 2014-05-06 LAB — URINE MICROSCOPIC-ADD ON

## 2014-05-06 SURGERY — CYSTOSCOPY, WITH RETROGRADE PYELOGRAM AND URETERAL STENT INSERTION
Anesthesia: General | Site: Ureter | Laterality: Left

## 2014-05-06 MED ORDER — LACTATED RINGERS IV SOLN: INTRAVENOUS | Status: DC

## 2014-05-06 MED ORDER — IOHEXOL 300 MG/ML  SOLN
INTRAMUSCULAR | Status: DC | PRN
  Administered 2014-05-06: 10 mL via ORAL

## 2014-05-06 MED ORDER — ACETAMINOPHEN 500 MG PO TABS: 1000.0000 mg | ORAL_TABLET | Freq: Once | ORAL | Status: DC

## 2014-05-06 MED ORDER — METOCLOPRAMIDE HCL 5 MG/ML IJ SOLN
INTRAMUSCULAR | Status: DC | PRN
  Administered 2014-05-06: 10 mg via INTRAVENOUS

## 2014-05-06 MED ORDER — FENTANYL CITRATE 0.05 MG/ML IJ SOLN
INTRAMUSCULAR | Status: DC | PRN
  Administered 2014-05-06: 50 ug via INTRAVENOUS

## 2014-05-06 MED ORDER — 0.9 % SODIUM CHLORIDE (POUR BTL) OPTIME
TOPICAL | Status: DC | PRN
  Administered 2014-05-06: 1000 mL

## 2014-05-06 MED ORDER — FENTANYL CITRATE 0.05 MG/ML IJ SOLN
25.0000 ug | Freq: Once | INTRAMUSCULAR | Status: AC
  Administered 2014-05-06: 25 ug via INTRAVENOUS
  Filled 2014-05-06: qty 2

## 2014-05-06 MED ORDER — ACETAMINOPHEN 500 MG PO TABS
500.0000 mg | ORAL_TABLET | Freq: Once | ORAL | Status: AC
  Administered 2014-05-06: 500 mg via ORAL
  Filled 2014-05-06: qty 1

## 2014-05-06 MED ORDER — SODIUM CHLORIDE 0.9 % IR SOLN
Status: DC | PRN
  Administered 2014-05-06: 3000 mL

## 2014-05-06 MED ORDER — SODIUM CHLORIDE 0.9 % IV SOLN
INTRAVENOUS | Status: DC
  Administered 2014-05-06 – 2014-05-07 (×2): via INTRAVENOUS

## 2014-05-06 MED ORDER — DEXTROSE 5 % IV SOLN
1.0000 g | Freq: Once | INTRAVENOUS | Status: AC
  Administered 2014-05-06: 1 g via INTRAVENOUS
  Filled 2014-05-06: qty 10

## 2014-05-06 MED ORDER — METOPROLOL TARTRATE 1 MG/ML IV SOLN
INTRAVENOUS | Status: AC
  Filled 2014-05-06: qty 5

## 2014-05-06 MED ORDER — FENTANYL CITRATE 0.05 MG/ML IJ SOLN
INTRAMUSCULAR | Status: AC
  Filled 2014-05-06: qty 2

## 2014-05-06 MED ORDER — PROPOFOL 10 MG/ML IV BOLUS
INTRAVENOUS | Status: DC | PRN
  Administered 2014-05-06: 150 mg via INTRAVENOUS

## 2014-05-06 MED ORDER — SODIUM CHLORIDE 0.9 % IV BOLUS (SEPSIS)
700.0000 mL | Freq: Once | INTRAVENOUS | Status: AC
  Administered 2014-05-06: 700 mL via INTRAVENOUS

## 2014-05-06 MED ORDER — LACTATED RINGERS IV SOLN: INTRAVENOUS | Status: DC | PRN

## 2014-05-06 MED ORDER — FENTANYL CITRATE 0.05 MG/ML IJ SOLN: 25.0000 ug | INTRAMUSCULAR | Status: DC | PRN

## 2014-05-06 MED ORDER — IBUPROFEN 200 MG PO TABS
600.0000 mg | ORAL_TABLET | Freq: Once | ORAL | Status: AC
  Administered 2014-05-06: 600 mg via ORAL
  Filled 2014-05-06: qty 3

## 2014-05-06 MED ORDER — METOCLOPRAMIDE HCL 5 MG/ML IJ SOLN
INTRAMUSCULAR | Status: AC
  Filled 2014-05-06: qty 2

## 2014-05-06 MED ORDER — PROMETHAZINE HCL 25 MG/ML IJ SOLN: 6.2500 mg | INTRAMUSCULAR | Status: DC | PRN

## 2014-05-06 MED ORDER — ONDANSETRON HCL 4 MG/2ML IJ SOLN
INTRAMUSCULAR | Status: AC
  Filled 2014-05-06: qty 2

## 2014-05-06 MED ORDER — ONDANSETRON HCL 4 MG/2ML IJ SOLN
INTRAMUSCULAR | Status: DC | PRN
  Administered 2014-05-06: 4 mg via INTRAVENOUS

## 2014-05-06 MED ORDER — SODIUM CHLORIDE 0.9 % IV SOLN
INTRAVENOUS | Status: DC | PRN
  Administered 2014-05-06: 22:00:00 via INTRAVENOUS

## 2014-05-06 MED ORDER — PROPOFOL 10 MG/ML IV BOLUS
INTRAVENOUS | Status: AC
  Filled 2014-05-06: qty 20

## 2014-05-06 MED ORDER — FENTANYL CITRATE 0.05 MG/ML IJ SOLN: 25.0000 ug | Freq: Once | INTRAMUSCULAR | Status: DC

## 2014-05-06 SURGICAL SUPPLY — 9 items
CATH TIEMANN FOLEY 18FR 5CC (CATHETERS) ×2 IMPLANT
CLOTH BEACON ORANGE TIMEOUT ST (SAFETY) ×3 IMPLANT
DRAPE CAMERA CLOSED 9X96 (DRAPES) ×3 IMPLANT
GLOVE BIOGEL M STRL SZ7.5 (GLOVE) ×3 IMPLANT
GOWN STRL REUS W/ TWL LRG LVL3 (GOWN DISPOSABLE) IMPLANT
GOWN STRL REUS W/TWL LRG LVL3 (GOWN DISPOSABLE) ×6
PACK CYSTO (CUSTOM PROCEDURE TRAY) ×3 IMPLANT
STENT CONTOUR 6FRX26X.038 (STENTS) ×2 IMPLANT
SYR CONTROL 10ML LL (SYRINGE) ×3 IMPLANT

## 2014-05-06 NOTE — ED Notes (Signed)
Pt reports having teeth pulled on Monday.  Pt states that by Monday night, he remembers having some discomfort when his stream of urine ends.  Pt states that he was put on cipro for a UTI.  Pt states that he has been having fevers/chills at home.  No NVD.  Last bowel movement yesterday.

## 2014-05-06 NOTE — Anesthesia Preprocedure Evaluation (Addendum)
Anesthesia Evaluation  Patient identified by MRN, date of birth, ID band Patient awake    Reviewed: Allergy & Precautions, H&P , NPO status , Patient's Chart, lab work & pertinent test results  Airway Mallampati: II TM Distance: >3 FB Neck ROM: Full    Dental  (+) Poor Dentition Missing many teeth. Broken teeth at the gum line.:   Pulmonary neg pulmonary ROS,  breath sounds clear to auscultation  Pulmonary exam normal       Cardiovascular hypertension, Rhythm:Regular Rate:Normal     Neuro/Psych PSYCHIATRIC DISORDERS Depression negative neurological ROS     GI/Hepatic negative GI ROS, Neg liver ROS,   Endo/Other  negative endocrine ROS  Renal/GU negative Renal ROS  negative genitourinary   Musculoskeletal negative musculoskeletal ROS (+)   Abdominal   Peds negative pediatric ROS (+)  Hematology negative hematology ROS (+)   Anesthesia Other Findings   Reproductive/Obstetrics negative OB ROS                          Anesthesia Physical Anesthesia Plan  ASA: II and emergent  Anesthesia Plan: General   Post-op Pain Management:    Induction: Intravenous  Airway Management Planned: LMA  Additional Equipment:   Intra-op Plan:   Post-operative Plan: Extubation in OR  Informed Consent: I have reviewed the patients History and Physical, chart, labs and discussed the procedure including the risks, benefits and alternatives for the proposed anesthesia with the patient or authorized representative who has indicated his/her understanding and acceptance.   Dental advisory given  Plan Discussed with: CRNA  Anesthesia Plan Comments:         Anesthesia Quick Evaluation

## 2014-05-06 NOTE — Transfer of Care (Signed)
Immediate Anesthesia Transfer of Care Note  Patient: Steven Cain  Procedure(s) Performed: Procedure(s) (LRB): CYSTOSCOPY WITH RETROGRADE PYELOGRAM/URETERAL STENT PLACEMENT (Left)  Patient Location: PACU  Anesthesia Type: General  Level of Consciousness: sedated, patient cooperative and responds to stimulation  Airway & Oxygen Therapy: Patient Spontanous Breathing and Patient connected to face mask oxgen  Post-op Assessment: Report given to PACU RN and Post -op Vital signs reviewed and stable  Post vital signs: Reviewed and stable  Complications: No apparent anesthesia complications

## 2014-05-06 NOTE — ED Provider Notes (Signed)
CSN: [id number]     Arrival date & time 05/06/14  1711 History   First MD Initiated Contact with Patient 05/06/14 1721     Chief Complaint  Patient presents with  . Dysuria  . Post-op Problem  . Fever     (Consider location/radiation/quality/duration/timing/severity/associated sxs/prior Treatment) HPI Patient reports history of frequent UTIs, at least 2 prior episodes earlier this year. Last week he had 3 teeth removed. He reports he was doing well until Monday, 2 days ago when he started not feeling well. Yesterday he started having fevers in the afternoon with chills. His temp got up to 100.8 orally. He states he only feels short of breath while he is having the chills. He has taken 2 doses of Cipro, one last night and one this morning. He continues to have chills and not feel well. He denies cough, chest pain, vomiting but he does have nausea. He denies diarrhea. He also describes some left-sided pain mainly in his left upper quadrant and it radiates into his left flank. He states he's never had a history of kidney stones or this pain before. Patient had a TURP done several years ago and since then has had urinary incontinence.  PCP Dr Virgina Jock [facility] Dr Rosana Hoes  Past Medical History  Diagnosis Date  . Hypertension   . Depression    Past Surgical History  Procedure Laterality Date  . Abdominal surgery    . Transurethral resection of prostate     History reviewed. No pertinent family history. History  Substance Use Topics  . Smoking status: Never Smoker   . Smokeless tobacco: Current User    Types: Chew  . Alcohol Use: No  lives at home Lives with spouse Retired physician  Review of Systems  All other systems reviewed and are negative.     Allergies  Review of patient's allergies indicates no known allergies.  Home Medications   Prior to Admission medications   Medication Sig Start Date End Date Taking? Authorizing Provider  Ascorbic Acid (VITAMIN C) 100 MG tablet  Take 100 mg by mouth daily.   Yes Historical Provider, MD  aspirin 325 MG tablet Take 325 mg by mouth daily.   Yes Historical Provider, MD  B Complex Vitamins (VITAMIN B COMPLEX IJ) Inject 1 mL as directed every 30 (thirty) days.   Yes Historical Provider, MD  ciprofloxacin (CIPRO) 500 MG tablet Take 500 mg by mouth 2 (two) times daily. Start date: 05/05/14- 05/11/14   Yes Historical Provider, MD  co-enzyme Q-10 30 MG capsule Take 30 mg by mouth 3 (three) times daily.   Yes Historical Provider, MD  Cyanocobalamin (VITAMIN B-12 IJ) Inject 1 mL as directed every 30 (thirty) days.   Yes Historical Provider, MD  fish oil-omega-3 fatty acids 1000 MG capsule Take 2 g by mouth daily.   Yes Historical Provider, MD  ibuprofen (ADVIL,MOTRIN) 200 MG tablet Take 400 mg by mouth every 6 (six) hours as needed for pain.   Yes Historical Provider, MD  PARoxetine (PAXIL) 20 MG tablet Take 40 mg by mouth 3 (three) times daily.   Yes Historical Provider, MD  saw palmetto 160 MG capsule Take 160 mg by mouth 2 (two) times daily.   Yes Historical Provider, MD  testosterone cypionate (DEPOTESTOTERONE CYPIONATE) 200 MG/ML injection Inject 100 mg into the muscle every 14 (fourteen) days.   Yes Historical Provider, MD  vitamin E 100 UNIT capsule Take 100 Units by mouth daily.   Yes Historical Provider, MD  BP 152/59  Pulse 74  Temp(Src) 101.8 F (38.8 C) (Rectal)  Resp 19  SpO2 95%  Vital signs normal except for fever  Physical Exam  Nursing note and vitals reviewed. Constitutional: He is oriented to person, place, and time. He appears well-developed and well-nourished.  Non-toxic appearance. He does not appear ill. No distress.  HENT:  Head: Normocephalic and atraumatic.  Right Ear: External ear normal.  Left Ear: External ear normal.  Nose: Nose normal. No mucosal edema or rhinorrhea.  Mouth/Throat: Oropharynx is clear and moist and mucous membranes are normal. No dental abscesses or uvula swelling.  Eyes:  Conjunctivae and EOM are normal. Pupils are equal, round, and reactive to light.  Neck: Normal range of motion and full passive range of motion without pain. Neck supple.  Cardiovascular: Normal rate, regular rhythm and normal heart sounds.  Exam reveals no gallop and no friction rub.   No murmur heard. Pulmonary/Chest: Effort normal and breath sounds normal. No respiratory distress. He has no wheezes. He has no rhonchi. He has no rales. He exhibits no tenderness and no crepitus.  Abdominal: Soft. Normal appearance and bowel sounds are normal. He exhibits no distension. There is tenderness. There is no rebound and no guarding.    Mild LUQ pain  Musculoskeletal: Normal range of motion. He exhibits no edema and no tenderness.  Moves all extremities well.   Neurological: He is alert and oriented to person, place, and time. He has normal strength. No cranial nerve deficit.  Skin: Skin is warm, dry and intact. No rash noted. No erythema. No pallor.  Psychiatric: He has a normal mood and affect. His speech is normal and behavior is normal. His mood appears not anxious.    ED Course  Procedures (including critical care time)  Medications  0.9 %  sodium chloride infusion ( Intravenous Stopped 05/06/14 1935)  fentaNYL (SUBLIMAZE) injection 25 mcg ( Intravenous MAR Hold 05/06/14 2229)  acetaminophen (TYLENOL) tablet 500 mg (500 mg Oral Given 05/06/14 1815)  ibuprofen (ADVIL,MOTRIN) tablet 600 mg (600 mg Oral Given 05/06/14 1815)  cefTRIAXone (ROCEPHIN) 1 g in dextrose 5 % 50 mL IVPB (0 g Intravenous Stopped 05/06/14 2022)  fentaNYL (SUBLIMAZE) injection 25 mcg (25 mcg Intravenous Given 05/06/14 1903)  sodium chloride 0.9 % bolus 700 mL (0 mLs Intravenous Stopped 05/06/14 2051)   Patient was given IV fluids. He was given fentanyl for his pain. After his cultures were obtained he was given Rocephin for his urinary tract infection that was not responsive to Cipro.  19:36 Dr Joylene Draft, will come see patient  and admit  Labs Review Results for orders placed during the hospital encounter of 05/06/14  CBC WITH DIFFERENTIAL      Result Value Ref Range   WBC 18.1 (*) 4.0 - 10.5 K/uL   RBC 4.66  4.22 - 5.81 MIL/uL   Hemoglobin 13.7  13.0 - 17.0 g/dL   HCT 40.6  39.0 - 52.0 %   MCV 87.1  78.0 - 100.0 fL   MCH 29.4  26.0 - 34.0 pg   MCHC 33.7  30.0 - 36.0 g/dL   RDW 15.0  11.5 - 15.5 %   Platelets 159  150 - 400 K/uL   Neutrophils Relative % 82 (*) 43 - 77 %   Neutro Abs 14.9 (*) 1.7 - 7.7 K/uL   Lymphocytes Relative 6 (*) 12 - 46 %   Lymphs Abs 1.1  0.7 - 4.0 K/uL   Monocytes Relative 11  3 -  12 %   Monocytes Absolute 1.9 (*) 0.1 - 1.0 K/uL   Eosinophils Relative 1  0 - 5 %   Eosinophils Absolute 0.1  0.0 - 0.7 K/uL   Basophils Relative 0  0 - 1 %   Basophils Absolute 0.0  0.0 - 0.1 K/uL  COMPREHENSIVE METABOLIC PANEL      Result Value Ref Range   Sodium 128 (*) 137 - 147 mEq/L   Potassium 4.8  3.7 - 5.3 mEq/L   Chloride 92 (*) 96 - 112 mEq/L   CO2 23  19 - 32 mEq/L   Glucose, Bld 127 (*) 70 - 99 mg/dL   BUN 25 (*) 6 - 23 mg/dL   Creatinine, Ser 1.96 (*) 0.50 - 1.35 mg/dL   Calcium 8.6  8.4 - 10.5 mg/dL   Total Protein 6.9  6.0 - 8.3 g/dL   Albumin 2.9 (*) 3.5 - 5.2 g/dL   AST 28  0 - 37 U/L   ALT 13  0 - 53 U/L   Alkaline Phosphatase 77  39 - 117 U/L   Total Bilirubin 0.7  0.3 - 1.2 mg/dL   GFR calc non Af Amer 30 (*) >90 mL/min   GFR calc Af Amer 35 (*) >90 mL/min   Anion gap 13  5 - 15  URINALYSIS, ROUTINE W REFLEX MICROSCOPIC      Result Value Ref Range   Color, Urine AMBER (*) YELLOW   APPearance TURBID (*) CLEAR   Specific Gravity, Urine 1.010  1.005 - 1.030   pH 5.5  5.0 - 8.0   Glucose, UA NEGATIVE  NEGATIVE mg/dL   Hgb urine dipstick MODERATE (*) NEGATIVE   Bilirubin Urine NEGATIVE  NEGATIVE   Ketones, ur NEGATIVE  NEGATIVE mg/dL   Protein, ur 30 (*) NEGATIVE mg/dL   Urobilinogen, UA 0.2  0.0 - 1.0 mg/dL   Nitrite NEGATIVE  NEGATIVE   Leukocytes, UA LARGE (*)  NEGATIVE  URINE MICROSCOPIC-ADD ON      Result Value Ref Range   Squamous Epithelial / LPF RARE  RARE   WBC, UA 21-50  <3 WBC/hpf   RBC / HPF 11-20  <3 RBC/hpf   Bacteria, UA RARE  RARE   Casts HYALINE CASTS (*) NEGATIVE  I-STAT CG4 LACTIC ACID, ED      Result Value Ref Range   Lactic Acid, Venous 1.78  0.5 - 2.2 mmol/L   Laboratory interpretation all normal except for leukocytosis, UTI, renal insufficiency, low sodium and chloride consistent with dehydration     Imaging Review Ct Abdomen Pelvis Wo Contrast  05/06/2014   CLINICAL DATA:  Left flank pain, hematuria, frequent urinary tract infections  EXAM: CT ABDOMEN AND PELVIS WITHOUT CONTRAST  TECHNIQUE: Multidetector CT imaging of the abdomen and pelvis was performed following the standard protocol without IV contrast.  COMPARISON:  None.  FINDINGS: There is mild subpleural interstitial change bilaterally throughout the lower lung zones. This is nonspecific but can be seen with usual interstitial pneumonitis.  Liver and gallbladder are normal. Spleen is normal. There is a small hiatal hernia. Pancreas is normal.  Adrenal glands are normal. Both kidneys are somewhat atrophic. There is mild right perinephric inflammatory change which is likely chronic. There is mild to moderate perinephric inflammatory change on the left. The left collecting system and renal pelvis are dilated. Renal pelvis is dilated to 3 cm. Proximal ureter is dilated to 15 mm. Left ureter is distended throughout its course down 2 transition point about  5 cm proximal to the bladder. There is no stones seen at this level, but the does appear to be soft tissue attenuation with the and the ureter with decompression of the ureter beyond this.  Bladder is normal. No bladder calculi. 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  No ascites or adenopathy. Calcification of the aorta. Nonobstructive bowel gas pattern.  Bone windows demonstrate significant degenerative disc disease  throughout most of the lumbar spine. There are numerous subcortical circumscribed lytic lesions throughout the lumbar spine. There are also similar small corticated lytic lesions on both sides of the hip joints. There is degenerative change of both hip joints. Lytic lesions associated with metastasis are considered, but given the association with degenerative disc disease and degenerative joint disease exclusively, these are likely degenerative related.  IMPRESSION: There is left hydronephrosis with transition in caliber of the left ureter about 5 cm proximal to the ureterovesical junction. There appears to be soft tissue attenuation within the ureter at this point. The findings are concerning for the possibility of transitional neoplasm of the ureter. Other possibilities would include blood clot or other debris.  Mild interstitial lung disease   Electronically Signed   By: Skipper Cliche M.D.   On: 05/06/2014 18:53     EKG Interpretation None      MDM   patient presents with fever, chills, and UTI symptoms that have not responded to oral Cipro for 24 hours. He also has new onset left upper quadrant/left flank pain with a CT scan suspicious for a possible ureteral tumor or non-opaque stone. Patient is being admitted for further treatment.    Final diagnoses:  Acute cystitis without hematuria  Fever, unspecified fever cause  Hydronephrosis, left    Plan admission    Janice Norrie, MD 05/06/14 2309

## 2014-05-06 NOTE — ED Notes (Signed)
Bed: XV40 Expected date:  Expected time:  Means of arrival:  Comments: Steven Cain

## 2014-05-06 NOTE — H&P (Signed)
Steven Cain is an 80 y.o. male.   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  He started cipro yesterday since he has a few uti's this year.   He then developed significant fever and worse left flank pain.   In the ER he is found to have a uti and ct shows some obstruction of urine flow on the left.   He will require admission.   He is weak. He has recurrent chills. He is not eating. Rigors.     Past Medical History  Diagnosis Date  . Hypertension   . Depression   Ulcer dz 1964 Perforated peptic ulcer 1974 Former alcoholism  Quit 1978,  Relapse and then finally quit 1994. b12 def Hypogonadism on testosterone bph Chronic bronchitis  Past Surgical History  Procedure Laterality Date  . Abdominal surgery    . 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 No pertinent family history. Social History:  reports that he has never smoked. His smokeless tobacco use includes Chew. 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   Married. 3 daughters,   3 sons 73 GCquit smoking 1980  Allergies: bee stings   Home meds:  Depot-test  200 IM once a  Month Vit c and E Magnesium 250 mg daily Asa 325 mg daily Vit d 1000 u daily Co enzyme q100 mg daily mvi daily b12 1000 mcg Im once a month Fish oil Flax seed paxil 40 mg  Tid   Results for orders placed during the hospital encounter of 05/06/14 (from the past 48 hour(s))  URINALYSIS, ROUTINE W REFLEX MICROSCOPIC     Status: Abnormal   Collection Time    05/06/14  6:15 PM      Result Value Ref Range   Color, Urine AMBER (*) YELLOW   Comment: BIOCHEMICALS MAY BE AFFECTED BY COLOR   APPearance TURBID (*) CLEAR   Specific Gravity, Urine 1.010  1.005 - 1.030   pH 5.5  5.0 - 8.0   Glucose, UA NEGATIVE  NEGATIVE mg/dL   Hgb  urine dipstick MODERATE (*) NEGATIVE   Bilirubin Urine NEGATIVE  NEGATIVE   Ketones, ur NEGATIVE  NEGATIVE mg/dL   Protein, ur 30 (*) NEGATIVE mg/dL   Urobilinogen, UA 0.2  0.0 - 1.0 mg/dL   Nitrite NEGATIVE  NEGATIVE   Leukocytes, UA LARGE (*) NEGATIVE  URINE MICROSCOPIC-ADD ON     Status: Abnormal   Collection Time    05/06/14  6:15 PM      Result Value Ref Range   Squamous Epithelial / LPF RARE  RARE   WBC, UA 21-50  <3 WBC/hpf   RBC / HPF 11-20  <3 RBC/hpf   Bacteria, UA RARE  RARE   Casts HYALINE CASTS (*) NEGATIVE  CBC WITH DIFFERENTIAL     Status: Abnormal   Collection Time    05/06/14  6:19 PM      Result Value Ref Range   WBC 18.1 (*) 4.0 - 10.5 K/uL   RBC 4.66  4.22 - 5.81 MIL/uL   Hemoglobin 13.7  13.0 - 17.0 g/dL   HCT 40.6  39.0 - 52.0 %   MCV 87.1  78.0 - 100.0 fL   MCH 29.4  26.0 - 34.0 pg   MCHC 33.7  30.0 -  36.0 g/dL   RDW 15.0  11.5 - 15.5 %   Platelets 159  150 - 400 K/uL   Neutrophils Relative % 82 (*) 43 - 77 %   Neutro Abs 14.9 (*) 1.7 - 7.7 K/uL   Lymphocytes Relative 6 (*) 12 - 46 %   Lymphs Abs 1.1  0.7 - 4.0 K/uL   Monocytes Relative 11  3 - 12 %   Monocytes Absolute 1.9 (*) 0.1 - 1.0 K/uL   Eosinophils Relative 1  0 - 5 %   Eosinophils Absolute 0.1  0.0 - 0.7 K/uL   Basophils Relative 0  0 - 1 %   Basophils Absolute 0.0  0.0 - 0.1 K/uL  COMPREHENSIVE METABOLIC PANEL     Status: Abnormal   Collection Time    05/06/14  6:19 PM      Result Value Ref Range   Sodium 128 (*) 137 - 147 mEq/L   Potassium 4.8  3.7 - 5.3 mEq/L   Chloride 92 (*) 96 - 112 mEq/L   CO2 23  19 - 32 mEq/L   Glucose, Bld 127 (*) 70 - 99 mg/dL   BUN 25 (*) 6 - 23 mg/dL   Creatinine, Ser 1.96 (*) 0.50 - 1.35 mg/dL   Calcium 8.6  8.4 - 10.5 mg/dL   Total Protein 6.9  6.0 - 8.3 g/dL   Albumin 2.9 (*) 3.5 - 5.2 g/dL   AST 28  0 - 37 U/L   ALT 13  0 - 53 U/L   Alkaline Phosphatase 77  39 - 117 U/L   Total Bilirubin 0.7  0.3 - 1.2 mg/dL   GFR calc non Af Amer 30 (*) >90  mL/min   GFR calc Af Amer 35 (*) >90 mL/min   Comment: (NOTE)     The eGFR has been calculated using the CKD EPI equation.     This calculation has not been validated in all clinical situations.     eGFR's persistently <90 mL/min signify possible Chronic Kidney     Disease.   Anion gap 13  5 - 15  I-STAT CG4 LACTIC ACID, ED     Status: None   Collection Time    05/06/14  6:34 PM      Result Value Ref Range   Lactic Acid, Venous 1.78  0.5 - 2.2 mmol/L   Ct Abdomen Pelvis Wo Contrast  05/06/2014   CLINICAL DATA:  Left flank pain, hematuria, frequent urinary tract infections  EXAM: CT ABDOMEN AND PELVIS WITHOUT CONTRAST  TECHNIQUE: Multidetector CT imaging of the abdomen and pelvis was performed following the standard protocol without IV contrast.  COMPARISON:  None.  FINDINGS: There is mild subpleural interstitial change bilaterally throughout the lower lung zones. This is nonspecific but can be seen with usual interstitial pneumonitis.  Liver and gallbladder are normal. Spleen is normal. There is a small hiatal hernia. Pancreas is normal.  Adrenal glands are normal. Both kidneys are somewhat atrophic. There is mild right perinephric inflammatory change which is likely chronic. There is mild to moderate perinephric inflammatory change on the left. The left collecting system and renal pelvis are dilated. Renal pelvis is dilated to 3 cm. Proximal ureter is dilated to 15 mm. Left ureter is distended throughout its course down 2 transition point about 5 cm proximal to the bladder. There is no stones seen at this level, but the does appear to be soft tissue attenuation with the and the ureter with decompression of the  ureter beyond this.  Bladder is normal. No bladder calculi. 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  No ascites or adenopathy. Calcification of the aorta. Nonobstructive bowel gas pattern.  Bone windows demonstrate significant degenerative disc disease throughout most of the lumbar spine.  There are numerous subcortical circumscribed lytic lesions throughout the lumbar spine. There are also similar small corticated lytic lesions on both sides of the hip joints. There is degenerative change of both hip joints. Lytic lesions associated with metastasis are considered, but given the association with degenerative disc disease and degenerative joint disease exclusively, these are likely degenerative related.  IMPRESSION: There is left hydronephrosis with transition in caliber of the left ureter about 5 cm proximal to the ureterovesical junction. There appears to be soft tissue attenuation within the ureter at this point. The findings are concerning for the possibility of transitional neoplasm of the ureter. Other possibilities would include blood clot or other debris.  Mild interstitial lung disease   Electronically Signed   By: Skipper Cliche M.D.   On: 05/06/2014 18:53    PPJ:KDTOI independently,     No cp, no sob.  He has an irregular rhythm to his breathing, long term.  Blood pressure 137/52, pulse 66, temperature 98.1 F (36.7 C), temperature source Oral, resp. rate 14, SpO2 94.00%.  semi supine in bed, nad,  skin is warm.  alert and oriented .  nad.  lungs are cta bilat. no w.r.r   heart is rrr no m/r/g.  abd soft. some tenderness in left side of abdomen.  no rebound or gaurding.  normoactive bs.  no edema.  moe times 4, no c/c/e.  Assessment/Plan 80 yo male with complicate uti with apparent upper ureteral obstruction concerning for tumor.  This could explain his recurrent uti issues in the last year.  Admit.  IVF.  Give IV Rocephin.  Give probiotic. Dvt prophylaxis .  Ask for urology consult as well. He is a full code status.  Jerlyn Ly, MD 05/06/2014, 8:17 PM

## 2014-05-06 NOTE — ED Notes (Signed)
Admitting physician at bedside

## 2014-05-06 NOTE — ED Notes (Signed)
Attempted to call report, unable to locate nurse at this time. She will return call for report

## 2014-05-06 NOTE — Consult Note (Signed)
S: Left moderate hydro with likely distal ureteral mass by ER CT as well as malaise, pyuria, leukocytosis and perinephric stranding worrisome for possible obstructed pyelonephritis.  O: NAD, wife at bedside SNTND Incontinent in diapers as per baseline  WBC 18.1k CT with left moderate hydro, slight bilateral perinephric stranding UA with sig pyuria  A/P: 1 - Pyelonephritis - UCX from ER and from OR pending, agree with empiric rocephin and will proceed with emergent left ureteral stent for renal decompression. Risks of non-cure, need for nephrostomy, bleeding, infection, need for stent changes discussed.   2 - Obstructing Left Ureteral Mass - plan for urgent ureteral stent today to allow decompression and clearance of suspected pyelo. Will need left ureteroscopy with biopsy in elective setting in several weeks which we will arrange for.  3 - Incontinence - likely incompetent sphinter by history with near total incontincne s/p prior TURP, foley for now while in house for accurate UOP monitoring.

## 2014-05-06 NOTE — Consult Note (Signed)
Urology Consult   Physician requesting consult: Dr. Crist Infante, Internal Medicine  Reason for consult: Hydronephrosis  History of Present Illness: Steven Cain is a 80 y.o. male with urologic history of BPH with bladder outlet obstruction, small capacity bladder, and detrusor overactivity with urge incontinence who underwent a TURP in 2011 by Dr. Rosana Hoes. He was incontinent prior to the TURP and has remained incontinent and reliant on pads. He gets about 2 UTIs per year. No other significant urologic history.  He reports 24 hours of renal colic pain in his left flank and lower quadrant with fevers, chills, and rigors.    Past Medical History  Diagnosis Date  . Hypertension   . Depression     Past Surgical History  Procedure Laterality Date  . Abdominal surgery    . Transurethral resection of prostate      Medications:  Home meds:    Medication List    ASK your doctor about these medications       aspirin 325 MG tablet  Take 325 mg by mouth daily.     ciprofloxacin 500 MG tablet  Commonly known as:  CIPRO  Take 500 mg by mouth 2 (two) times daily. Start date: 05/05/14- 05/11/14     co-enzyme Q-10 30 MG capsule  Take 30 mg by mouth 3 (three) times daily.     fish oil-omega-3 fatty acids 1000 MG capsule  Take 2 g by mouth daily.     ibuprofen 200 MG tablet  Commonly known as:  ADVIL,MOTRIN  Take 400 mg by mouth every 6 (six) hours as needed for pain.     PARoxetine 20 MG tablet  Commonly known as:  PAXIL  Take 40 mg by mouth 3 (three) times daily.     saw palmetto 160 MG capsule  Take 160 mg by mouth 2 (two) times daily.     testosterone cypionate 200 MG/ML injection  Commonly known as:  DEPOTESTOTERONE CYPIONATE  Inject 100 mg into the muscle every 14 (fourteen) days.     VITAMIN B COMPLEX IJ  Inject 1 mL as directed every 30 (thirty) days.     VITAMIN B-12 IJ  Inject 1 mL as directed every 30 (thirty) days.     vitamin C 100 MG tablet  Take 100 mg by  mouth daily.     vitamin E 100 UNIT capsule  Take 100 Units by mouth daily.        Scheduled Meds: . fentaNYL  25 mcg Intravenous Once   Continuous Infusions: . sodium chloride Stopped (05/06/14 1935)   PRN Meds:.  Allergies: No Known Allergies  History reviewed. No pertinent family history.  Social History:  reports that he has never smoked. His smokeless tobacco use includes Chew. He reports that he does not drink alcohol or use illicit drugs.  ROS: A complete review of systems was performed.  All systems are negative except for pertinent findings as noted.  Physical Exam:  Vital signs in last 24 hours: Temp:  [98.1 F (36.7 C)-101.8 F (38.8 C)] 98.1 F (36.7 C) (09/23 1916) Pulse Rate:  [65-74] 65 (09/23 2100) Resp:  [10-23] 23 (09/23 2100) BP: (137-152)/(52-61) 145/61 mmHg (09/23 2100) SpO2:  [94 %-96 %] 96 % (09/23 2100) Constitutional:  Alert and oriented, No acute distress Cardiovascular: Regular rate and rhythm, No JVD Respiratory: Normal respiratory effort, Lungs clear bilaterally GI: Abdomen is soft, nontender, nondistended, no abdominal masses Genitourinary: Left CVAT. 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 Lymphatic: No lymphadenopathy Neurologic: Grossly intact, no focal deficits Psychiatric: Normal mood and affect  Laboratory Data:   Recent Labs  05/06/14 1819  WBC 18.1*  HGB 13.7  HCT 40.6  PLT 159     Recent Labs  05/06/14 1819  NA 128*  K 4.8  CL 92*  GLUCOSE 127*  BUN 25*  CALCIUM 8.6  CREATININE 1.96*  Baseline 1.2 in 2013.  Urinalysis    Component Value Date/Time   COLORURINE AMBER* 05/06/2014 1815   APPEARANCEUR TURBID* 05/06/2014 1815   LABSPEC 1.010 05/06/2014 1815   PHURINE 5.5 05/06/2014 1815   GLUCOSEU NEGATIVE 05/06/2014 1815   HGBUR MODERATE* 05/06/2014 1815   BILIRUBINUR  NEGATIVE 05/06/2014 1815   KETONESUR NEGATIVE 05/06/2014 1815   PROTEINUR 30* 05/06/2014 1815   UROBILINOGEN 0.2 05/06/2014 1815   NITRITE NEGATIVE 05/06/2014 1815   LEUKOCYTESUR LARGE* 05/06/2014 1815   Microanalysis: 21-50 WBC, 11-20 RBC, Rare bacteria   Radiologic Imaging: Ct Abdomen Pelvis Wo Contrast  05/06/2014   CLINICAL DATA:  Left flank pain, hematuria, frequent urinary tract infections  EXAM: CT ABDOMEN AND PELVIS WITHOUT CONTRAST  TECHNIQUE: Multidetector CT imaging of the abdomen and pelvis was performed following the standard protocol without IV contrast.  COMPARISON:  None.  FINDINGS: There is mild subpleural interstitial change bilaterally throughout the lower lung zones. This is nonspecific but can be seen with usual interstitial pneumonitis.  Liver and gallbladder are normal. Spleen is normal. There is a small hiatal hernia. Pancreas is normal.  Adrenal glands are normal. Both kidneys are somewhat atrophic. There is mild right perinephric inflammatory change which is likely chronic. There is mild to moderate perinephric inflammatory change on the left. The left collecting system and renal pelvis are dilated. Renal pelvis is dilated to 3 cm. Proximal ureter is dilated to 15 mm. Left ureter is distended throughout its course down 2 transition point about 5 cm proximal to the bladder. There is no stones seen at this level, but the does appear to be soft tissue attenuation with the and the ureter with decompression of the ureter beyond this.  Bladder is normal. No bladder calculi. 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  No ascites or adenopathy. Calcification of the aorta. Nonobstructive bowel gas pattern.  Bone windows demonstrate significant degenerative disc disease throughout most of the lumbar spine. There are numerous subcortical circumscribed lytic lesions throughout the lumbar spine. There are also similar small corticated lytic lesions on both sides of the hip joints. There is  degenerative change of both hip joints. Lytic lesions associated with metastasis are considered, but given the association with degenerative disc disease and degenerative joint disease exclusively, these are likely degenerative related.  IMPRESSION: There is left hydronephrosis with transition in caliber of the left ureter about 5 cm proximal to the ureterovesical junction. There appears to be soft tissue attenuation within the ureter at this point. The findings are concerning for the possibility of transitional neoplasm of the ureter. Other possibilities would include blood clot or other debris.  Mild interstitial lung disease   Electronically Signed   By: Skipper Cliche M.D.   On: 05/06/2014 18:53    I independently reviewed the above imaging studies: Non-con CT with mild hydroureteronephrosis down to the distal ureter where there is a soft tissue density at a transition point distal to which the ureter caliber is normal. No stones.   Impression/Recommendation  71M with evidence of ureteral obstruction due to a distal ureteral stricture vs soft tissue mass with fevers and significant leukocytosis.   Discussed rationale for emergent urinary drainage with a ureteral stent. Risks and benefits of ureteral stent placement were discussed including bleeding, infection, damage to the ureter, risk of stricture, damage to the kidney and remote change of loss of kidney. Also discussed the small risk of being unsuccessful with stent placement and subsequent need for nephrotomy tube placement. The patient understands that she will need to spend the night in the hospital and has the potential for worsening of fevers and hemodynamic instability requiring ICU or stepdown status. Will be on IV antibiotics until urine culture returns. Explained that we will perform diagnostic ureteroscopy with possible biopsy at a later date once he has recovered from this acute episode.  Discussed with Dr. Tresa Moore who agrees.

## 2014-05-06 NOTE — Brief Op Note (Signed)
05/06/2014  10:58 PM  PATIENT:  Steven Cain  80 y.o. male  PRE-OPERATIVE DIAGNOSIS:  Left ureteral stone  POST-OPERATIVE DIAGNOSIS:  same as pre-op  PROCEDURE:  Procedure(s): CYSTOSCOPY WITH RETROGRADE PYELOGRAM/URETERAL STENT PLACEMENT (Left)  SURGEON:  Surgeon(s) and Role:    * [name], MD - Primary  PHYSICIAN ASSISTANT:   ASSISTANTS: Amaryllis Dyke, MD   ANESTHESIA:   general  EBL:     BLOOD ADMINISTERED:none  DRAINS: foley to straight drain   LOCAL MEDICATIONS USED:  NONE  SPECIMEN:  No Specimen  DISPOSITION OF SPECIMEN:  PATHOLOGY  COUNTS:  YES  TOURNIQUET:  * No tourniquets in log *  DICTATION: .Other Dictation: Dictation Number [id number]  PLAN OF CARE: Admit to inpatient   PATIENT DISPOSITION:  PACU - hemodynamically stable.   Delay start of Pharmacological VTE agent (>24hrs) due to surgical blood loss or risk of bleeding: not applicable

## 2014-05-07 LAB — COMPREHENSIVE METABOLIC PANEL
ALBUMIN: 2.9 g/dL — AB (ref 3.5–5.2)
ALK PHOS: 69 U/L (ref 39–117)
ALT: 13 U/L (ref 0–53)
AST: 27 U/L (ref 0–37)
Anion gap: 12 (ref 5–15)
BUN: 26 mg/dL — ABNORMAL HIGH (ref 6–23)
CHLORIDE: 95 meq/L — AB (ref 96–112)
CO2: 24 meq/L (ref 19–32)
Calcium: 8.5 mg/dL (ref 8.4–10.5)
Creatinine, Ser: 1.95 mg/dL — ABNORMAL HIGH (ref 0.50–1.35)
GFR calc Af Amer: 35 mL/min — ABNORMAL LOW (ref >90)
GFR, EST NON AFRICAN AMERICAN: 31 mL/min — AB (ref >90)
Glucose, Bld: 168 mg/dL — ABNORMAL HIGH (ref 70–99)
POTASSIUM: 4.9 meq/L (ref 3.7–5.3)
Sodium: 131 mEq/L — ABNORMAL LOW (ref 137–147)
Total Bilirubin: 0.6 mg/dL (ref 0.3–1.2)
Total Protein: 6.8 g/dL (ref 6.0–8.3)

## 2014-05-07 LAB — URINE CULTURE
Colony Count: NO GROWTH
Culture: NO GROWTH
Special Requests: NORMAL

## 2014-05-07 LAB — CBC WITH DIFFERENTIAL/PLATELET
Basophils Absolute: 0 10*3/uL (ref 0.0–0.1)
Basophils Relative: 0 % (ref 0–1)
EOS ABS: 0.3 10*3/uL (ref 0.0–0.7)
Eosinophils Relative: 2 % (ref 0–5)
HEMATOCRIT: 40.1 % (ref 39.0–52.0)
HEMOGLOBIN: 13.5 g/dL (ref 13.0–17.0)
LYMPHS ABS: 1.5 10*3/uL (ref 0.7–4.0)
Lymphocytes Relative: 9 % — ABNORMAL LOW (ref 12–46)
MCH: 29.1 pg (ref 26.0–34.0)
MCHC: 33.7 g/dL (ref 30.0–36.0)
MCV: 86.4 fL (ref 78.0–100.0)
MONO ABS: 1 10*3/uL (ref 0.1–1.0)
MONOS PCT: 6 % (ref 3–12)
NEUTROS PCT: 83 % — AB (ref 43–77)
Neutro Abs: 13.5 10*3/uL — ABNORMAL HIGH (ref 1.7–7.7)
Platelets: 179 10*3/uL (ref 150–400)
RBC: 4.64 MIL/uL (ref 4.22–5.81)
RDW: 15.2 % (ref 11.5–15.5)
WBC: 16.3 10*3/uL — ABNORMAL HIGH (ref 4.0–10.5)

## 2014-05-07 LAB — PHOSPHORUS: Phosphorus: 2.7 mg/dL (ref 2.3–4.6)

## 2014-05-07 LAB — MAGNESIUM: Magnesium: 2.2 mg/dL (ref 1.5–2.5)

## 2014-05-07 MED ORDER — ENOXAPARIN SODIUM 40 MG/0.4ML ~~LOC~~ SOLN
40.0000 mg | SUBCUTANEOUS | Status: DC
  Administered 2014-05-07 – 2014-05-08 (×2): 40 mg via SUBCUTANEOUS
  Filled 2014-05-07 (×2): qty 0.4

## 2014-05-07 MED ORDER — ONDANSETRON HCL 4 MG PO TABS: 4.0000 mg | ORAL_TABLET | Freq: Four times a day (QID) | ORAL | Status: DC | PRN

## 2014-05-07 MED ORDER — DEXTROSE 5 % IV SOLN
1.0000 g | INTRAVENOUS | Status: DC
  Administered 2014-05-07: 1 g via INTRAVENOUS
  Filled 2014-05-07 (×2): qty 10

## 2014-05-07 MED ORDER — ACETAMINOPHEN 325 MG PO TABS
650.0000 mg | ORAL_TABLET | Freq: Four times a day (QID) | ORAL | Status: DC | PRN
  Administered 2014-05-07 (×2): 650 mg via ORAL
  Filled 2014-05-07 (×2): qty 2

## 2014-05-07 MED ORDER — PAROXETINE HCL 20 MG PO TABS
40.0000 mg | ORAL_TABLET | Freq: Three times a day (TID) | ORAL | Status: DC
  Administered 2014-05-07 – 2014-05-08 (×4): 40 mg via ORAL
  Filled 2014-05-07 (×5): qty 2

## 2014-05-07 MED ORDER — POLYETHYLENE GLYCOL 3350 17 GM/SCOOP PO POWD
1.0000 | Freq: Two times a day (BID) | ORAL | Status: DC
  Filled 2014-05-07: qty 255

## 2014-05-07 MED ORDER — SACCHAROMYCES BOULARDII 250 MG PO CAPS
250.0000 mg | ORAL_CAPSULE | Freq: Two times a day (BID) | ORAL | Status: DC
  Administered 2014-05-07 – 2014-05-08 (×4): 250 mg via ORAL
  Filled 2014-05-07 (×4): qty 1

## 2014-05-07 MED ORDER — DIPHENHYDRAMINE HCL 25 MG PO CAPS
25.0000 mg | ORAL_CAPSULE | Freq: Four times a day (QID) | ORAL | Status: DC | PRN
  Administered 2014-05-07: 25 mg via ORAL
  Filled 2014-05-07: qty 1

## 2014-05-07 MED ORDER — MORPHINE SULFATE 2 MG/ML IJ SOLN: 2.0000 mg | INTRAMUSCULAR | Status: DC | PRN

## 2014-05-07 MED ORDER — SENNA 8.6 MG PO TABS
1.0000 | ORAL_TABLET | Freq: Every day | ORAL | Status: DC
  Filled 2014-05-07: qty 1

## 2014-05-07 MED ORDER — ONDANSETRON HCL 4 MG/2ML IJ SOLN: 4.0000 mg | Freq: Four times a day (QID) | INTRAMUSCULAR | Status: DC | PRN

## 2014-05-07 MED ORDER — ASPIRIN 325 MG PO TABS
325.0000 mg | ORAL_TABLET | Freq: Every day | ORAL | Status: DC
  Administered 2014-05-07 – 2014-05-08 (×2): 325 mg via ORAL
  Filled 2014-05-07 (×2): qty 1

## 2014-05-07 MED ORDER — OXYBUTYNIN CHLORIDE 5 MG PO TABS
10.0000 mg | ORAL_TABLET | Freq: Once | ORAL | Status: AC
  Administered 2014-05-07: 10 mg via ORAL
  Filled 2014-05-07: qty 2

## 2014-05-07 MED ORDER — OXYBUTYNIN CHLORIDE ER 10 MG PO TB24
10.0000 mg | ORAL_TABLET | Freq: Every day | ORAL | Status: DC
  Administered 2014-05-07: 10 mg via ORAL
  Filled 2014-05-07 (×2): qty 1

## 2014-05-07 MED ORDER — POLYETHYLENE GLYCOL 3350 17 G PO PACK
17.0000 g | PACK | ORAL | Status: DC
  Filled 2014-05-07: qty 1

## 2014-05-07 MED ORDER — POLYETHYLENE GLYCOL 3350 17 G PO PACK
17.0000 g | PACK | Freq: Two times a day (BID) | ORAL | Status: DC
  Administered 2014-05-08: 17 g via ORAL
  Filled 2014-05-07 (×3): qty 1

## 2014-05-07 MED ORDER — ACETAMINOPHEN 650 MG RE SUPP: 650.0000 mg | Freq: Four times a day (QID) | RECTAL | Status: DC | PRN

## 2014-05-07 MED ORDER — SODIUM CHLORIDE 0.9 % IV SOLN
INTRAVENOUS | Status: DC
  Administered 2014-05-07: 01:00:00 via INTRAVENOUS

## 2014-05-07 NOTE — Progress Notes (Signed)
1 Day Post-Op  Subjective:  1 - Pyelonephritis - Leukocytosis, perinephric stranding by CT, and pyuria on UA 9/23 from ER worrisome for pyelonephritis with obstruction. Now on Rocephin and s/p left ureteral stent. [place] 9/23 pending   2 - Obstructing Left Ureteral Mass - worrisome soft tissue mass appearing intraluminal left ureter by CT and operative retrograde pyelogram 9/23. Now s/p stenting. No bladder lesiosn or additional lesions by imaging. He has h/o extensive smokeless tobacco exposure.   3 - Incontinence - Long h/o near total incontinence s/p TURP previously by Dr. Rosana Hoes. [place] for now for accurate UOP monitoring.   Today Steven Cain just c/o some bladder spasms that come and go. Less malaise today. No acute events overnight.   Objective: Vital signs in last 24 hours: Temp:  [97.2 F (36.2 C)-101.8 F (38.8 C)] 97.8 F (36.6 C) (09/24 0540) Pulse Rate:  [58-74] 64 (09/24 0540) Resp:  [9-23] 14 (09/24 0540) BP: (107-152)/(50-61) 124/58 mmHg (09/24 0540) SpO2:  [94 %-99 %] 95 % (09/24 0540) Weight:  [83.416 kg (183 lb 14.4 oz)-83.507 kg (184 lb 1.6 oz)] 83.416 kg (183 lb 14.4 oz) (09/24 0540) Last BM Date: 05/07/14  Intake/Output from previous day: 09/23 0701 - 09/24 0700 In: 1127.5 [P.O.:440; I.V.:687.5] Out: 1250 [Urine:1250] Intake/Output this shift:    General appearance: alert and cooperative Nose: Nares normal. Septum midline. Mucosa normal. No drainage or sinus tenderness. Throat: lips, mucosa, and tongue normal; teeth and gums normal and poor dentition Back: symmetric, no curvature. ROM normal. No CVA tenderness. Resp: non-labored on room air.  Cardio: NL rate GI: soft, non-tender; bowel sounds normal; no masses,  no organomegaly Male genitalia: normal, foley c/d/i with clear yellow urine.  Extremities: extremities normal, atraumatic, no cyanosis or edema Pulses: 2+ and symmetric Skin: Skin color, texture, turgor normal. No rashes or lesions Lymph nodes: Cervical,  supraclavicular, and axillary nodes normal. Neurologic: Grossly normal  Lab Results:   Recent Labs  05/06/14 1819 05/07/14 0141  WBC 18.1* 16.3*  HGB 13.7 13.5  HCT 40.6 40.1  PLT 159 179   BMET  Recent Labs  05/06/14 1819 05/07/14 0141  NA 128* 131*  K 4.8 4.9  CL 92* 95*  CO2 23 24  GLUCOSE 127* 168*  BUN 25* 26*  CREATININE 1.96* 1.95*  CALCIUM 8.6 8.5   PT/INR No results found for this basename: LABPROT, INR,  in the last 72 hours ABG No results found for this basename: PHART, PCO2, PO2, HCO3,  in the last 72 hours  Studies/Results: Ct Abdomen Pelvis Wo Contrast  05/06/2014   CLINICAL DATA:  Left flank pain, hematuria, frequent urinary tract infections  EXAM: CT ABDOMEN AND PELVIS WITHOUT CONTRAST  TECHNIQUE: Multidetector CT imaging of the abdomen and pelvis was performed following the standard protocol without IV contrast.  COMPARISON:  None.  FINDINGS: There is mild subpleural interstitial change bilaterally throughout the lower lung zones. This is nonspecific but can be seen with usual interstitial pneumonitis.  Liver and gallbladder are normal. Spleen is normal. There is a small hiatal hernia. Pancreas is normal.  Adrenal glands are normal. Both kidneys are somewhat atrophic. There is mild right perinephric inflammatory change which is likely chronic. There is mild to moderate perinephric inflammatory change on the left. The left collecting system and renal pelvis are dilated. Renal pelvis is dilated to 3 cm. Proximal ureter is dilated to 15 mm. Left ureter is distended throughout its course down 2 transition point about 5 cm proximal to the  bladder. There is no stones seen at this level, but the does appear to be soft tissue attenuation with the and the ureter with decompression of the ureter beyond this.  Bladder is normal. No bladder calculi. 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  No ascites or adenopathy. Calcification of the aorta. Nonobstructive bowel gas  pattern.  Bone windows demonstrate significant degenerative disc disease throughout most of the lumbar spine. There are numerous subcortical circumscribed lytic lesions throughout the lumbar spine. There are also similar small corticated lytic lesions on both sides of the hip joints. There is degenerative change of both hip joints. Lytic lesions associated with metastasis are considered, but given the association with degenerative disc disease and degenerative joint disease exclusively, these are likely degenerative related.  IMPRESSION: There is left hydronephrosis with transition in caliber of the left ureter about 5 cm proximal to the ureterovesical junction. There appears to be soft tissue attenuation within the ureter at this point. The findings are concerning for the possibility of transitional neoplasm of the ureter. Other possibilities would include blood clot or other debris.  Mild interstitial lung disease   Electronically Signed   By: Skipper Cliche M.D.   On: 05/06/2014 18:53    Anti-infectives: Anti-infectives   Start     Dose/Rate Route Frequency Ordered Stop   05/07/14 1800  cefTRIAXone (ROCEPHIN) 1 g in dextrose 5 % 50 mL IVPB     1 g 100 mL/hr over 30 Minutes Intravenous Every 24 hours 05/07/14 0039     05/06/14 1845  cefTRIAXone (ROCEPHIN) 1 g in dextrose 5 % 50 mL IVPB     1 g 100 mL/hr over 30 Minutes Intravenous  Once 05/06/14 1838 05/06/14 2022      Assessment/Plan:  1 - Pyelonephritis - agree with current ABX. UCX pending. Remains afebrile.   2 - Obstructing Left Ureteral Mass - will need dedicated ureteroscopic biopsy in elective setting after acute UTI treated.   3 - Incontinence - Foley until not needed for accurate UOP, then resume prior regimen with pads.   Will follow.   [facility], Oanh Devivo 05/07/2014

## 2014-05-07 NOTE — Progress Notes (Signed)
Went to room to hang dose of antibiotics and noticed that pt had a red splotchy pattern of discoloration on his arms and legs. Pt denied itching or pain and no raised areas or bumps noticed. Pt did c/o chills at this time and his oral temp was 100.10F. Antibiotic hung and pt given tylenol 650mg  for the fever. MD notified of possible rash and orders received to given a dose of benadryl 25mg  po. Temp rechecked an hour after tylenol given and it was 98.6 po. Chills and skin discoloration had resolved at that timePt has also complained of bladder spasms prior to and after foley catheter removal. MD gave order to bladder scan pt and to give a one time dose of ditropan 10mg . Pt is incontinent and is wearing his depends from home. Pt has already had one depends changed and it felt pretty heavy. Bladder scan performed and found about 60ml of urine in the bladder. Pt is not retaining at this time. Will continue to monitor.   Othella Boyer [facility] 05/07/2014

## 2014-05-07 NOTE — Care Management Note (Addendum)
    Page 1 of 1   05/08/2014     2:26:46 PM CARE MANAGEMENT NOTE 05/08/2014  Patient:  Steven Cain, Steven Cain   Account Number:  [id number]  Date Initiated:  05/07/2014  Documentation initiated by:  Dessa Phi  Subjective/Objective Assessment:   80 Y/O M ADMITTED W/UTI.     Action/Plan:   FROM HOME.   Anticipated DC Date:  05/08/2014   Anticipated DC Plan:  [place]  CM consult      Choice offered to / List presented to:             Status of service:  Completed, signed off Medicare Important Message given?   (If response is "NO", the following Medicare IM given date fields will be blank) Date Medicare IM given:   Medicare IM given by:   Date Additional Medicare IM given:   Additional Medicare IM given by:    Discharge Disposition:  HOME/SELF CARE  Per UR Regulation:  Reviewed for med. necessity/level of care/duration of stay  If discussed at [place] of Stay Meetings, dates discussed:    Comments:  05/08/14 Jadia Capers RN,BSN NCM [phone] SPOKE TO PATIENT ABOUT D/C PLANS.HAS PCP,FAMILY SUPPORT. EXPLAINED HIS INSURANCE, & BILLING.PATIENT VOICED UNDERSTANDING.NO FURTHER D/C NEEDS.  05/07/14 Lash Matulich RN,BSN NCM [phone] S/P L URETERAL STONE/STENT.NO ANTICIPATED D/C NEEDS.

## 2014-05-07 NOTE — Progress Notes (Signed)
Subjective: Feeling better after placement of left ureteral stent, still having bladder spasms and mild constipation  Objective: Vital signs in last 24 hours: Temp:  [97.2 F (36.2 C)-101.8 F (38.8 C)] 97.8 F (36.6 C) (09/24 0540) Pulse Rate:  [58-74] 64 (09/24 0540) Resp:  [9-23] 14 (09/24 0540) BP: (107-152)/(50-61) 124/58 mmHg (09/24 0540) SpO2:  [94 %-99 %] 95 % (09/24 0540) Weight:  [83.416 kg (183 lb 14.4 oz)-83.507 kg (184 lb 1.6 oz)] 83.416 kg (183 lb 14.4 oz) (09/24 0540) Weight change:    Intake/Output from previous day: 09/23 0701 - 09/24 0700 In: 1127.5 [P.O.:440; I.V.:687.5] Out: 1250 [Urine:1250]   General appearance: alert, cooperative and no distress Resp: clear to auscultation bilaterally Cardio: regular rate and rhythm GI: soft, non-tender; bowel sounds normal; no masses,  no organomegaly Extremities: extremities normal, atraumatic, no cyanosis or edema  Lab Results:  Recent Labs  05/06/14 1819 05/07/14 0141  WBC 18.1* 16.3*  HGB 13.7 13.5  HCT 40.6 40.1  PLT 159 179   BMET  Recent Labs  05/06/14 1819 05/07/14 0141  NA 128* 131*  K 4.8 4.9  CL 92* 95*  CO2 23 24  GLUCOSE 127* 168*  BUN 25* 26*  CREATININE 1.96* 1.95*  CALCIUM 8.6 8.5   CMET CMP     Component Value Date/Time   NA 131* 05/07/2014 0141   K 4.9 05/07/2014 0141   CL 95* 05/07/2014 0141   CO2 24 05/07/2014 0141   GLUCOSE 168* 05/07/2014 0141   BUN 26* 05/07/2014 0141   CREATININE 1.95* 05/07/2014 0141   CREATININE 1.28 01/03/2012 1041   CALCIUM 8.5 05/07/2014 0141   PROT 6.8 05/07/2014 0141   ALBUMIN 2.9* 05/07/2014 0141   AST 27 05/07/2014 0141   ALT 13 05/07/2014 0141   ALKPHOS 69 05/07/2014 0141   BILITOT 0.6 05/07/2014 0141   GFRNONAA 31* 05/07/2014 0141   GFRAA 35* 05/07/2014 0141    CBG (last 3)  No results found for this basename: GLUCAP,  in the last 72 hours  INR RESULTS:   Lab Results  Component Value Date   INR 0.95 01/11/2010     Studies/Results: Ct  Abdomen Pelvis Wo Contrast  05/06/2014   CLINICAL DATA:  Left flank pain, hematuria, frequent urinary tract infections  EXAM: CT ABDOMEN AND PELVIS WITHOUT CONTRAST  TECHNIQUE: Multidetector CT imaging of the abdomen and pelvis was performed following the standard protocol without IV contrast.  COMPARISON:  None.  FINDINGS: There is mild subpleural interstitial change bilaterally throughout the lower lung zones. This is nonspecific but can be seen with usual interstitial pneumonitis.  Liver and gallbladder are normal. Spleen is normal. There is a small hiatal hernia. Pancreas is normal.  Adrenal glands are normal. Both kidneys are somewhat atrophic. There is mild right perinephric inflammatory change which is likely chronic. There is mild to moderate perinephric inflammatory change on the left. The left collecting system and renal pelvis are dilated. Renal pelvis is dilated to 3 cm. Proximal ureter is dilated to 15 mm. Left ureter is distended throughout its course down 2 transition point about 5 cm proximal to the bladder. There is no stones seen at this level, but the does appear to be soft tissue attenuation with the and the ureter with decompression of the ureter beyond this.  Bladder is normal. No bladder calculi. 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  No ascites or adenopathy. Calcification of the aorta. Nonobstructive bowel gas pattern.  Bone windows demonstrate significant degenerative  disc disease throughout most of the lumbar spine. There are numerous subcortical circumscribed lytic lesions throughout the lumbar spine. There are also similar small corticated lytic lesions on both sides of the hip joints. There is degenerative change of both hip joints. Lytic lesions associated with metastasis are considered, but given the association with degenerative disc disease and degenerative joint disease exclusively, these are likely degenerative related.  IMPRESSION: There is left hydronephrosis with  transition in caliber of the left ureter about 5 cm proximal to the ureterovesical junction. There appears to be soft tissue attenuation within the ureter at this point. The findings are concerning for the possibility of transitional neoplasm of the ureter. Other possibilities would include blood clot or other debris.  Mild interstitial lung disease   Electronically Signed   By: Skipper Cliche M.D.   On: 05/06/2014 18:53    Medications: I have reviewed the patient's current medications.  Assessment/Plan: #1 Pyelonephritis: improved and stable on rocephin, awaiting culture results #2 Constipation: mild and will adjust meds #3 Renal Insufficiency: moderate and will optimize IVF   LOS: 1 day   Steven Cain 05/07/2014, 8:12 AM

## 2014-05-07 NOTE — Op Note (Signed)
NAMEBRUIN, BOLGER NO.:  [id number]  MEDICAL RECORD NO.:  [id number]  LOCATION:  7902                         FACILITY:  [facility]  PHYSICIAN:  [name], MD     DATE OF BIRTH:  05-May-1931  DATE OF PROCEDURE:  05/06/2014 DATE OF DISCHARGE:                              OPERATIVE REPORT   DIAGNOSES:  Left hydronephrosis, pyuria, leukocytosis, and left ureteral mass.  PROCEDURE: 1. Cystoscopy with left retrograde pyelogram interpretation. 2. Insertion of left ureteral stent 6 x 26, no tether.    SURGEON:  [name], MD  COMPLICATIONS:  None.  SPECIMEN:  None.  FINDINGS: 1. Left moderate hydroureteronephrosis to  filling defect in     the distal ureter close to the area of presumed iliac crossing,     worrisome for intraluminal mass. 2. Successful placement of left ureteral stent, proximal curl in upper     pole and distal curl in the urinary bladder.  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  He was found to have significant bacteriuria, pyuria, and new left hydronephrosis with bilateral perinephric stranding and what appears to be a solid left distal ureteral mass.  The overall picture was worrisome for possible obstructive pyelonephritis and options were discussed including emergent stenting for left renal decompression and to allow for passive dilation, suspect further diagnosis could be performed in elective fashion.  He wished to proceed.  Informed consent was obtained and placed in medical record.  PROCEDURE IN DETAIL:  [name] was verified and procedure being cystoscopy with left stent placement was confirmed. Procedure was carried out.  Time-out was performed.  Intravenous antibiotics administered.  General LMA anesthesia was introduced.   The patient was placed into a low lithotomy position.  Sterile field was created by prepping and draping the patient's penis, perineum, and proximal thighs using iodine x3.  Next, cystourethroscopy was performed using a 22-French rigid cystoscope with a 12-degree offset lens. Inspection of anterior and posterior urethra revealed a wide open membranous and prostatic urethra with prior TURP defect.  There was some mild erythema in the urinary bladder consistent with likely cystitis. Ureteral orifices were in the normal anatomic position.  The left ureteral orifice cannulated with a 6-French end-hole catheter and left retrograde pyelogram was obtained.  Left pyelogram demonstrated a single left ureter, single system left kidney.  There was a filling defect in distal ureter.  It appeared to be approximately 3 cm to 4 cm in length close to the iliac crossing.  This is worrisome for intraluminal mass as suspected by his CAT scan.  A Sensor wire was then advanced at the level of upper pole, and a new 6 x 26 contour-type stent was carefully navigated.  Good proximal and distal curl were noted.  Efflux of urine was seen around into the distal end of the stent.  The cystoscope was exchanged for an 18-French Foley catheter per urethra to straight drain with 10cc sterile water in balloon.  The procedure was then terminated.  The  patient tolerated the procedure well.  There were no immediate periprocedural complications.  The patient was taken to postanesthesia care unit in stable condition.  He is being admitted to Internal Medicine service and we will Continue to follow as consultants inhouse.          ______________________________ [name], MD     TM/MEDQ  D:  05/06/2014  T:  05/07/2014  Job:  [id number]

## 2014-05-07 NOTE — Progress Notes (Signed)
Assumed care of pt at this time. Pt is stable with no complaints. Will continue to monitor.

## 2014-05-07 NOTE — Anesthesia Postprocedure Evaluation (Signed)
  Anesthesia Post-op Note  Patient: Steven Cain  Procedure(s) Performed: Procedure(s) (LRB): CYSTOSCOPY WITH RETROGRADE PYELOGRAM/URETERAL STENT PLACEMENT (Left)  Patient Location: PACU  Anesthesia Type: General  Level of Consciousness: awake and alert   Airway and Oxygen Therapy: Patient Spontanous Breathing  Post-op Pain: mild  Post-op Assessment: Post-op Vital signs reviewed, Patient's Cardiovascular Status Stable, Respiratory Function Stable, Patent Airway and No signs of Nausea or vomiting  Last Vitals:  Filed Vitals:   05/07/14 0540  BP: 124/58  Pulse: 64  Temp: 36.6 C  Resp: 14    Post-op Vital Signs: stable   Complications: No apparent anesthesia complications

## 2014-05-08 ENCOUNTER — Encounter (HOSPITAL_COMMUNITY): Payer: Self-pay | Admitting: Urology

## 2014-05-08 DIAGNOSIS — N2889 Other specified disorders of kidney and ureter: Secondary | ICD-10-CM | POA: Diagnosis present

## 2014-05-08 DIAGNOSIS — N1330 Unspecified hydronephrosis: Secondary | ICD-10-CM | POA: Diagnosis present

## 2014-05-08 DIAGNOSIS — N189 Chronic kidney disease, unspecified: Secondary | ICD-10-CM

## 2014-05-08 DIAGNOSIS — N10 Acute tubulo-interstitial nephritis: Secondary | ICD-10-CM | POA: Diagnosis present

## 2014-05-08 LAB — CBC WITH DIFFERENTIAL/PLATELET
Basophils Absolute: 0 10*3/uL (ref 0.0–0.1)
Basophils Relative: 0 % (ref 0–1)
EOS ABS: 0.2 10*3/uL (ref 0.0–0.7)
Eosinophils Relative: 1 % (ref 0–5)
HEMATOCRIT: 37.7 % — AB (ref 39.0–52.0)
Hemoglobin: 12.5 g/dL — ABNORMAL LOW (ref 13.0–17.0)
LYMPHS ABS: 1.3 10*3/uL (ref 0.7–4.0)
LYMPHS PCT: 11 % — AB (ref 12–46)
MCH: 29.2 pg (ref 26.0–34.0)
MCHC: 33.2 g/dL (ref 30.0–36.0)
MCV: 88.1 fL (ref 78.0–100.0)
MONO ABS: 1.1 10*3/uL — AB (ref 0.1–1.0)
Monocytes Relative: 9 % (ref 3–12)
Neutro Abs: 9.7 10*3/uL — ABNORMAL HIGH (ref 1.7–7.7)
Neutrophils Relative %: 79 % — ABNORMAL HIGH (ref 43–77)
Platelets: 173 10*3/uL (ref 150–400)
RBC: 4.28 MIL/uL (ref 4.22–5.81)
RDW: 15.5 % (ref 11.5–15.5)
WBC: 12.3 10*3/uL — AB (ref 4.0–10.5)

## 2014-05-08 LAB — COMPREHENSIVE METABOLIC PANEL
ALT: 10 U/L (ref 0–53)
AST: 23 U/L (ref 0–37)
Albumin: 2.5 g/dL — ABNORMAL LOW (ref 3.5–5.2)
Alkaline Phosphatase: 70 U/L (ref 39–117)
Anion gap: 11 (ref 5–15)
BILIRUBIN TOTAL: 0.4 mg/dL (ref 0.3–1.2)
BUN: 22 mg/dL (ref 6–23)
CHLORIDE: 101 meq/L (ref 96–112)
CO2: 23 meq/L (ref 19–32)
CREATININE: 1.86 mg/dL — AB (ref 0.50–1.35)
Calcium: 8.6 mg/dL (ref 8.4–10.5)
GFR calc Af Amer: 37 mL/min — ABNORMAL LOW (ref >90)
GFR, EST NON AFRICAN AMERICAN: 32 mL/min — AB (ref >90)
Glucose, Bld: 100 mg/dL — ABNORMAL HIGH (ref 70–99)
Potassium: 4.4 mEq/L (ref 3.7–5.3)
SODIUM: 135 meq/L — AB (ref 137–147)
Total Protein: 6.4 g/dL (ref 6.0–8.3)

## 2014-05-08 MED ORDER — AMOXICILLIN-POT CLAVULANATE 875-125 MG PO TABS: 1.0000 | ORAL_TABLET | Freq: Two times a day (BID) | ORAL | Status: AC

## 2014-05-08 NOTE — Progress Notes (Signed)
2 Days Post-Op  Subjective:  1 - Pyelonephritis - Leukocytosis, perinephric stranding by CT, and pyuria on UA 9/23 from ER worrisome for pyelonephritis with obstruction. Now on Rocephin and s/p left ureteral stent. UCX 9/23 negative.  2 - Obstructing Left Ureteral Mass - worrisome soft tissue mass appearing intraluminal left ureter by CT and operative retrograde pyelogram 9/23. Now s/p stenting. No bladder lesiosn or additional lesions by imaging. He has h/o extensive smokeless tobacco exposure.   3 - Incontinence - Long h/o near total incontinence s/p TURP previously by Dr. Rosana Hoes. Foley removed.   No issues overnight. Voiding frequently into diaper. No subjective fevers, chills, nausea or vomiting, tolerating regular food. Cultures back and negative.    Objective: Vital signs in last 24 hours: Temp:  [98.1 F (36.7 C)-100.3 F (37.9 C)] 98.6 F (37 C) (09/25 0449) Pulse Rate:  [63-70] 70 (09/25 0449) Resp:  [20] 20 (09/25 0449) BP: (118-143)/(49-67) 143/67 mmHg (09/25 0449) SpO2:  [90 %-96 %] 96 % (09/25 0449) Weight:  [84 kg (185 lb 3 oz)] 84 kg (185 lb 3 oz) (09/25 0449) Last BM Date: 05/07/14  Intake/Output from previous day: 09/24 0701 - 09/25 0700 In: 1130 [P.O.:360; I.V.:770] Out: -  Intake/Output this shift: Total I/O In: 1130 [P.O.:360; I.V.:770] Out: -    Exam: AAOx3, in NAD Normal work of breathing Abdomen soft, NT/ND No CVA tenderness No foley in. Patient in diapers Extremities WWP, no edema  Lab Results:   CBC Latest Ref Rng 05/08/2014 05/07/2014 05/06/2014  WBC 4.0 - 10.5 K/uL 12.3(H) 16.3(H) 18.1(H)  Hemoglobin 13.0 - 17.0 g/dL 12.5(L) 13.5 13.7  Hematocrit 39.0 - 52.0 % 37.7(L) 40.1 40.6  Platelets 150 - 400 K/uL 173 179 159      BMET  Recent Labs  05/07/14 0141 05/08/14 0429  NA 131* 135*  K 4.9 4.4  CL 95* 101  CO2 24 23  GLUCOSE 168* 100*  BUN 26* 22  CREATININE 1.95* 1.86*  CALCIUM 8.5 8.6    UCx: 9/23 - negative BCx: 9/23 - no  growth to date  Studies/Results: Ct Abdomen Pelvis Wo Contrast  05/06/2014   CLINICAL DATA:  Left flank pain, hematuria, frequent urinary tract infections  EXAM: CT ABDOMEN AND PELVIS WITHOUT CONTRAST  TECHNIQUE: Multidetector CT imaging of the abdomen and pelvis was performed following the standard protocol without IV contrast.  COMPARISON:  None.  FINDINGS: There is mild subpleural interstitial change bilaterally throughout the lower lung zones. This is nonspecific but can be seen with usual interstitial pneumonitis.  Liver and gallbladder are normal. Spleen is normal. There is a small hiatal hernia. Pancreas is normal.  Adrenal glands are normal. Both kidneys are somewhat atrophic. There is mild right perinephric inflammatory change which is likely chronic. There is mild to moderate perinephric inflammatory change on the left. The left collecting system and renal pelvis are dilated. Renal pelvis is dilated to 3 cm. Proximal ureter is dilated to 15 mm. Left ureter is distended throughout its course down 2 transition point about 5 cm proximal to the bladder. There is no stones seen at this level, but the does appear to be soft tissue attenuation with the and the ureter with decompression of the ureter beyond this.  Bladder is normal. No bladder calculi. 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  No ascites or adenopathy. Calcification of the aorta. Nonobstructive bowel gas pattern.  Bone windows demonstrate significant degenerative disc disease throughout most of the lumbar spine. There are numerous subcortical  circumscribed lytic lesions throughout the lumbar spine. There are also similar small corticated lytic lesions on both sides of the hip joints. There is degenerative change of both hip joints. Lytic lesions associated with metastasis are considered, but given the association with degenerative disc disease and degenerative joint disease exclusively, these are likely degenerative related.  IMPRESSION:  There is left hydronephrosis with transition in caliber of the left ureter about 5 cm proximal to the ureterovesical junction. There appears to be soft tissue attenuation within the ureter at this point. The findings are concerning for the possibility of transitional neoplasm of the ureter. Other possibilities would include blood clot or other debris.  Mild interstitial lung disease   Electronically Signed   By: Skipper Cliche M.D.   On: 05/06/2014 18:53    Anti-infectives: Anti-infectives   Start     Dose/Rate Route Frequency Ordered Stop   05/07/14 1800  cefTRIAXone (ROCEPHIN) 1 g in dextrose 5 % 50 mL IVPB     1 g 100 mL/hr over 30 Minutes Intravenous Every 24 hours 05/07/14 0039     05/06/14 1845  cefTRIAXone (ROCEPHIN) 1 g in dextrose 5 % 50 mL IVPB     1 g 100 mL/hr over 30 Minutes Intravenous  Once 05/06/14 1838 05/06/14 2022      Assessment/Plan:  1 - Pyelonephritis - agree with current ABX. UCX negative. Remains afebrile. Can d/c on Augmentin x 14 days  2 - Obstructing Left Ureteral Mass - will need dedicated ureteroscopic biopsy in elective setting after acute UTI treated.   3 - Incontinence - Foley removed yesterday. Baseline incontinence managed with pads.   Dispo: ok to d/c from urology standpoint. Will follow up in clinic prior to subsequent diagnostic ureteroscopy   Steven Cain, Kainalu Heggs C 05/08/2014

## 2014-05-08 NOTE — Discharge Summary (Addendum)
Physician Discharge Summary  Patient ID: Steven Cain MRN: [id number] DOB/AGE: [birth year] 80 y.o.  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 He started cipro yesterday since he has a few uti's this year. He then developed significant fever and worse left flank pain. In the ER he is found to have a uti and ct shows some obstruction of urine flow on the left. He will require admission. He is weak. He has recurrent chills. He is not eating. Rigors.   Workup included a CT abdomen and pelvis that showed signs of left hydronephrosis and acute pyelonephritis. He was started on IV antibiotics and had a cystoscopy with retrograde left pyelogram and placement of a left ureteral stent. Urine cultures returned normal. He did well and was changed to augmentin at discharge. He was eating and drinking well with no significant abdominal pain or diarrhea. There were no complications.  Consults: urology  Significant Diagnostic Studies:  Ct Abdomen Pelvis Wo Contrast  05/06/2014   CLINICAL DATA:  Left flank pain, hematuria, frequent urinary tract infections  EXAM: CT ABDOMEN AND PELVIS WITHOUT CONTRAST  TECHNIQUE: Multidetector CT imaging of the abdomen and pelvis was performed following the standard protocol without IV contrast.  COMPARISON:  None.  FINDINGS: There is mild subpleural interstitial change bilaterally throughout the lower lung zones. This is nonspecific but can be seen with usual interstitial pneumonitis.  Liver and gallbladder are normal. Spleen is normal. There is a small hiatal hernia. Pancreas is normal.  Adrenal glands are normal. Both kidneys are somewhat  atrophic. There is mild right perinephric inflammatory change which is likely chronic. There is mild to moderate perinephric inflammatory change on the left. The left collecting system and renal pelvis are dilated. Renal pelvis is dilated to 3 cm. Proximal ureter is dilated to 15 mm. Left ureter is distended throughout its course down 2 transition point about 5 cm proximal to the bladder. There is no stones seen at this level, but the does appear to be soft tissue attenuation with the and the ureter with decompression of the ureter beyond this.  Bladder is normal. No bladder calculi. 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  No ascites or adenopathy. Calcification of the aorta. Nonobstructive bowel gas pattern.  Bone windows demonstrate significant degenerative disc disease throughout most of the lumbar spine. There are numerous subcortical circumscribed lytic lesions throughout the lumbar spine. There are also similar small corticated lytic lesions on both sides of the hip joints. There is degenerative change of both hip joints. Lytic lesions associated with metastasis are considered, but given the association with degenerative disc disease and degenerative joint disease exclusively, these are likely degenerative related.  IMPRESSION: There is left hydronephrosis with transition in caliber of the left ureter about 5 cm proximal to the ureterovesical junction. There appears to be soft tissue attenuation within the ureter at this point. The findings are concerning for the possibility of transitional neoplasm of the ureter. Other possibilities would include blood clot or other debris.  Mild interstitial lung disease   Electronically Signed   By: Skipper Cliche M.D.   On: 05/06/2014 18:53    Labs:  Lab Results  Component Value Date   WBC 12.3* 05/08/2014   HGB 12.5* 05/08/2014   HCT 37.7* 05/08/2014   MCV 88.1 05/08/2014   PLT 173 05/08/2014      Recent Labs Lab 05/08/14 0429  NA 135*  K 4.4  CL 101   CO2 23  BUN 22  CREATININE 1.86*  CALCIUM 8.6  PROT 6.4  BILITOT 0.4  ALKPHOS 70  ALT 10  AST 23  GLUCOSE 100*       Lab Results  Component Value Date   INR 0.95 01/11/2010     Recent Results (from the past 240 hour(s))  CULTURE, BLOOD (ROUTINE X 2)     Status: None   Collection Time    05/06/14  6:06 PM      Result Value Ref Range Status   Specimen Description BLOOD RIGHT HAND   Final   Special Requests BOTTLES DRAWN AEROBIC AND ANAEROBIC 5ML   Final   Culture  Setup Time     Final   Value: 05/06/2014 23:53     Performed at [facility]   Culture     Final   Value:        BLOOD CULTURE RECEIVED NO GROWTH TO DATE CULTURE WILL BE HELD FOR 5 DAYS BEFORE ISSUING A FINAL NEGATIVE REPORT     Performed at [facility]   Report Status PENDING   Incomplete  URINE CULTURE     Status: None   Collection Time    05/06/14  6:15 PM      Result Value Ref Range Status   Specimen Description URINE, RANDOM   Final   Special Requests Normal   Final   Culture  Setup Time     Final   Value: 05/07/2014 01:18     Performed at [facility]     Final   Value: NO GROWTH     Performed at [facility]   Culture     Final   Value: NO GROWTH     Performed at [facility]   Report Status 05/07/2014 FINAL   Final  CULTURE, BLOOD (ROUTINE X 2)     Status: None   Collection Time    05/06/14  6:19 PM      Result Value Ref Range Status   Specimen Description BLOOD LEFT FOREARM   Final   Special Requests BOTTLES DRAWN AEROBIC AND ANAEROBIC 5ML   Final   Culture  Setup Time     Final   Value: 05/06/2014 23:54     Performed at [facility]   Culture     Final   Value:        BLOOD CULTURE RECEIVED NO GROWTH TO DATE CULTURE WILL BE HELD FOR 5 DAYS BEFORE ISSUING A FINAL NEGATIVE REPORT     Performed at [facility]   Report Status PENDING   Incomplete      Discharge Exam: Blood pressure 143/67, pulse 70, temperature 98.6  F (37 C), temperature source Oral, resp. rate 20, height 5\' 6"  (1.676 m), weight 84 kg (185 lb 3 oz), SpO2 96.00%.  Physical Exam: In general he is a well nourished well developed white man in no apparent distress. HEENT exam was normal, chest was clear, heart had a regular rate and rhythm, abdomen had normal bowel sounds and no tenderness, extremities had bilateral trace leg edema, neurological exam was nonfocal  Disposition:  He will  be discharged to home and will have followup visits with his PCP and his urologist.      Discharge Instructions   Call MD for:    Complete by:  As directed   Fever, chills, severe diarrhea, pain, or other concerning symptoms     Diet - low sodium heart healthy    Complete by:  As directed      Discharge instructions    Complete by:  As directed   Resume usual diet, schedule followup visits with Drs. Virgina Jock and Dr. Tresa Moore     Increase activity slowly    Complete by:  As directed             Medication List    STOP taking these medications       ciprofloxacin 500 MG tablet  Commonly known as:  CIPRO     ibuprofen 200 MG tablet  Commonly known as:  ADVIL,MOTRIN      TAKE these medications       amoxicillin-clavulanate 875-125 MG per tablet  Commonly known as:  AUGMENTIN  Take 1 tablet by mouth 2 (two) times daily.     aspirin 325 MG tablet  Take 325 mg by mouth daily.     co-enzyme Q-10 30 MG capsule  Take 30 mg by mouth 3 (three) times daily.     fish oil-omega-3 fatty acids 1000 MG capsule  Take 2 g by mouth daily.     PARoxetine 20 MG tablet  Commonly known as:  PAXIL  Take 40 mg by mouth 3 (three) times daily.     saw palmetto 160 MG capsule  Take 160 mg by mouth 2 (two) times daily.     testosterone cypionate 200 MG/ML injection  Commonly known as:  DEPOTESTOTERONE CYPIONATE  Inject 100 mg into the muscle every 14 (fourteen) days.     VITAMIN B COMPLEX IJ  Inject 1 mL as directed every 30 (thirty) days.     VITAMIN B-12  IJ  Inject 1 mL as directed every 30 (thirty) days.     vitamin C 100 MG tablet  Take 100 mg by mouth daily.     vitamin E 100 UNIT capsule  Take 100 Units by mouth daily.       Follow-up Information   Call [name], MD. (For pre-op follow up appointment with Dr. Tresa Moore)    Specialty:  Urology   Contact information:   [place]  [address] [phone]       Follow up with Precious Reel, MD. Schedule an appointment as soon as possible for a visit in 1 week.   Specialty:  Internal Medicine   Contact information:   [place] [address] [phone]       Signed: Donnajean Lopes 05/08/2014, 8:07 AM

## 2014-05-08 NOTE — Discharge Instructions (Signed)
1. You may see some blood in the urine and may have some burning with urination for 48-72 hours. You also may notice that you have to urinate more frequently or urgently after your procedure which is normal.  2. You should call should you develop an inability urinate, fever > 101, persistent nausea and vomiting that prevents you from eating or drinking to stay hydrated.  3. You have a stent. Uou will likely urinate more frequently and urgently until the stent is removed and you may experience some discomfort/pain in the lower abdomen and flank especially when urinating. You may take pain medication prescribed to you if needed for pain. You may also intermittently have blood in the urine until the stent is removed.

## 2014-05-12 LAB — CULTURE, BLOOD (ROUTINE X 2)
CULTURE: NO GROWTH
Culture: NO GROWTH

## 2014-05-26 ENCOUNTER — Other Ambulatory Visit: Payer: Self-pay | Admitting: Urology

## 2014-05-28 ENCOUNTER — Encounter (HOSPITAL_COMMUNITY): Payer: Self-pay | Admitting: *Deleted

## 2014-05-29 NOTE — Progress Notes (Signed)
SPOKE WITH PT BY PHONE, HE WANTED [place] TO CALL HIS WIFE AND SEE IF SHE CAN BRING HIM FOR EKG AND CHEST XRAY Monday 06-01-14, MESSAGE LEFT ON PTS WIFE CELLPHONE NUMBER, CAN SHE BRING HIM FOR EKG AND CHESY XRAY Monday 06-01-14 SINCE HE HAS 115 PM APPT WITH DR [facility] THAT DAY.

## 2014-05-29 NOTE — Progress Notes (Signed)
SPOKE WITH Steven Cain PT WIFE BY PHONE, SHE WILL BRING HIM 1100 AM Monday FOR EKG AND CHEST XRAY Monday 06-01-14

## 2014-06-01 ENCOUNTER — Encounter (HOSPITAL_COMMUNITY)
Admission: RE | Admit: 2014-06-01 | Discharge: 2014-06-01 | Disposition: A | Discharge status: Home / Self Care | Payer: BC Managed Care – PPO | Source: Ambulatory Visit | Attending: Urology | Admitting: Urology

## 2014-06-01 ENCOUNTER — Encounter (HOSPITAL_COMMUNITY): Payer: Self-pay | Admitting: Pharmacy Technician

## 2014-06-01 ENCOUNTER — Other Ambulatory Visit: Payer: Self-pay

## 2014-06-01 ENCOUNTER — Ambulatory Visit (HOSPITAL_COMMUNITY)
Admission: RE | Admit: 2014-06-01 | Discharge: 2014-06-01 | Disposition: A | Discharge status: Home / Self Care | Payer: BC Managed Care – PPO | Source: Ambulatory Visit | Attending: Anesthesiology | Admitting: Anesthesiology

## 2014-06-01 DIAGNOSIS — Z01818 Encounter for other preprocedural examination: Secondary | ICD-10-CM | POA: Diagnosis not present

## 2014-06-01 DIAGNOSIS — I10 Essential (primary) hypertension: Secondary | ICD-10-CM | POA: Insufficient documentation

## 2014-06-02 MED ORDER — GENTAMICIN SULFATE 40 MG/ML IJ SOLN
360.0000 mg | INTRAVENOUS | Status: AC
  Administered 2014-06-03: 360 mg via INTRAVENOUS
  Filled 2014-06-02: qty 9

## 2014-06-03 ENCOUNTER — Ambulatory Visit (HOSPITAL_COMMUNITY)
Admission: RE | Admit: 2014-06-03 | Discharge: 2014-06-03 | Disposition: A | Discharge status: Home / Self Care | Payer: BC Managed Care – PPO | Source: Ambulatory Visit | Attending: Urology | Admitting: Urology

## 2014-06-03 ENCOUNTER — Encounter (HOSPITAL_COMMUNITY)
Admission: RE | Disposition: A | Discharge status: Home / Self Care | Payer: Self-pay | Source: Ambulatory Visit | Attending: Urology

## 2014-06-03 ENCOUNTER — Encounter (HOSPITAL_COMMUNITY): Payer: Self-pay | Admitting: *Deleted

## 2014-06-03 ENCOUNTER — Encounter (HOSPITAL_COMMUNITY): Payer: BC Managed Care – PPO | Admitting: Anesthesiology

## 2014-06-03 ENCOUNTER — Ambulatory Visit (HOSPITAL_COMMUNITY): Payer: BC Managed Care – PPO | Admitting: Anesthesiology

## 2014-06-03 DIAGNOSIS — N1330 Unspecified hydronephrosis: Secondary | ICD-10-CM

## 2014-06-03 DIAGNOSIS — F329 Major depressive disorder, single episode, unspecified: Secondary | ICD-10-CM | POA: Insufficient documentation

## 2014-06-03 DIAGNOSIS — R32 Unspecified urinary incontinence: Secondary | ICD-10-CM | POA: Insufficient documentation

## 2014-06-03 DIAGNOSIS — N2889 Other specified disorders of kidney and ureter: Secondary | ICD-10-CM | POA: Insufficient documentation

## 2014-06-03 DIAGNOSIS — N135 Crossing vessel and stricture of ureter without hydronephrosis: Secondary | ICD-10-CM | POA: Diagnosis present

## 2014-06-03 DIAGNOSIS — N289 Disorder of kidney and ureter, unspecified: Secondary | ICD-10-CM | POA: Diagnosis not present

## 2014-06-03 DIAGNOSIS — I10 Essential (primary) hypertension: Secondary | ICD-10-CM | POA: Diagnosis not present

## 2014-06-03 DIAGNOSIS — Z466 Encounter for fitting and adjustment of urinary device: Secondary | ICD-10-CM | POA: Diagnosis not present

## 2014-06-03 HISTORY — PX: CYSTOSCOPY/RETROGRADE/URETEROSCOPY: SHX5316

## 2014-06-03 LAB — BASIC METABOLIC PANEL
Anion gap: 11 (ref 5–15)
BUN: 23 mg/dL (ref 6–23)
CALCIUM: 9 mg/dL (ref 8.4–10.5)
CO2: 27 mEq/L (ref 19–32)
Chloride: 100 mEq/L (ref 96–112)
Creatinine, Ser: 1.43 mg/dL — ABNORMAL HIGH (ref 0.50–1.35)
GFR calc Af Amer: 51 mL/min — ABNORMAL LOW (ref >90)
GFR calc non Af Amer: 44 mL/min — ABNORMAL LOW (ref >90)
Glucose, Bld: 116 mg/dL — ABNORMAL HIGH (ref 70–99)
Potassium: 4.1 mEq/L (ref 3.7–5.3)
Sodium: 138 mEq/L (ref 137–147)

## 2014-06-03 LAB — CBC
HEMATOCRIT: 41.2 % (ref 39.0–52.0)
Hemoglobin: 13.2 g/dL (ref 13.0–17.0)
MCH: 28.2 pg (ref 26.0–34.0)
MCHC: 32 g/dL (ref 30.0–36.0)
MCV: 88 fL (ref 78.0–100.0)
PLATELETS: 168 10*3/uL (ref 150–400)
RBC: 4.68 MIL/uL (ref 4.22–5.81)
RDW: 14.7 % (ref 11.5–15.5)
WBC: 6.4 10*3/uL (ref 4.0–10.5)

## 2014-06-03 SURGERY — CYSTOSCOPY/RETROGRADE/URETEROSCOPY
Anesthesia: General | Laterality: Left

## 2014-06-03 MED ORDER — LACTATED RINGERS IV SOLN
INTRAVENOUS | Status: DC
  Administered 2014-06-03: 11:00:00 via INTRAVENOUS

## 2014-06-03 MED ORDER — SODIUM CHLORIDE 0.9 % IR SOLN
Status: DC | PRN
  Administered 2014-06-03: 4000 mL

## 2014-06-03 MED ORDER — LIDOCAINE HCL (CARDIAC) 10 MG/ML IV SOLN
INTRAVENOUS | Status: DC | PRN
  Administered 2014-06-03: 60 mg via INTRAVENOUS

## 2014-06-03 MED ORDER — FENTANYL CITRATE 0.05 MG/ML IJ SOLN
INTRAMUSCULAR | Status: DC | PRN
  Administered 2014-06-03: 50 ug via INTRAVENOUS
  Administered 2014-06-03: 100 ug via INTRAVENOUS

## 2014-06-03 MED ORDER — PROMETHAZINE HCL 25 MG/ML IJ SOLN
6.2500 mg | INTRAMUSCULAR | Status: DC | PRN
Start: 2014-06-03 — End: 2014-06-03

## 2014-06-03 MED ORDER — PROPOFOL 10 MG/ML IV BOLUS
INTRAVENOUS | Status: AC
  Filled 2014-06-03: qty 20

## 2014-06-03 MED ORDER — LIDOCAINE HCL (CARDIAC) 20 MG/ML IV SOLN
INTRAVENOUS | Status: AC
  Filled 2014-06-03: qty 5

## 2014-06-03 MED ORDER — ONDANSETRON HCL 4 MG/2ML IJ SOLN
INTRAMUSCULAR | Status: AC
  Filled 2014-06-03: qty 2

## 2014-06-03 MED ORDER — IBUPROFEN 200 MG PO TABS
200.0000 mg | ORAL_TABLET | Freq: Once | ORAL | Status: AC
  Administered 2014-06-03: 200 mg via ORAL
  Filled 2014-06-03: qty 1

## 2014-06-03 MED ORDER — MEPERIDINE HCL 50 MG/ML IJ SOLN: 6.2500 mg | INTRAMUSCULAR | Status: DC | PRN

## 2014-06-03 MED ORDER — GLYCOPYRROLATE 0.2 MG/ML IJ SOLN
INTRAMUSCULAR | Status: DC | PRN
  Administered 2014-06-03: 0.2 mg via INTRAVENOUS

## 2014-06-03 MED ORDER — OXYCODONE-ACETAMINOPHEN 5-325 MG PO TABS: 1.0000 | ORAL_TABLET | Freq: Four times a day (QID) | ORAL | Status: DC | PRN

## 2014-06-03 MED ORDER — FENTANYL CITRATE 0.05 MG/ML IJ SOLN
INTRAMUSCULAR | Status: AC
  Filled 2014-06-03: qty 5

## 2014-06-03 MED ORDER — LACTATED RINGERS IV SOLN
INTRAVENOUS | Status: DC | PRN
  Administered 2014-06-03: 08:00:00 via INTRAVENOUS

## 2014-06-03 MED ORDER — FENTANYL CITRATE 0.05 MG/ML IJ SOLN: 25.0000 ug | INTRAMUSCULAR | Status: DC | PRN

## 2014-06-03 MED ORDER — PROPOFOL 10 MG/ML IV BOLUS
INTRAVENOUS | Status: DC | PRN
  Administered 2014-06-03: 150 mg via INTRAVENOUS

## 2014-06-03 MED ORDER — SENNOSIDES-DOCUSATE SODIUM 8.6-50 MG PO TABS: 1.0000 | ORAL_TABLET | Freq: Two times a day (BID) | ORAL | Status: DC

## 2014-06-03 MED ORDER — ONDANSETRON HCL 4 MG/2ML IJ SOLN
INTRAMUSCULAR | Status: DC | PRN
  Administered 2014-06-03: 4 mg via INTRAVENOUS

## 2014-06-03 MED ORDER — IOHEXOL 300 MG/ML  SOLN
INTRAMUSCULAR | Status: DC | PRN
  Administered 2014-06-03: 10 mL

## 2014-06-03 SURGICAL SUPPLY — 19 items
BAG URO CATCHER STRL LF (DRAPE) ×3 IMPLANT
BASKET LASER NITINOL 1.9FR (BASKET) IMPLANT
BRUSH URET BIOPSY 3F (UROLOGICAL SUPPLIES) ×3 IMPLANT
BSKT STON RTRVL 120 1.9FR (BASKET)
CATH INTERMIT  6FR 70CM (CATHETERS) ×2 IMPLANT
DRAPE CAMERA CLOSED 9X96 (DRAPES) ×3 IMPLANT
FORCEPS BIOP 2.4F 115CM BACKLD (INSTRUMENTS) ×2 IMPLANT
GLOVE BIOGEL M STRL SZ7.5 (GLOVE) ×3 IMPLANT
GOWN STRL REUS W/TWL LRG LVL3 (GOWN DISPOSABLE) ×3 IMPLANT
GUIDEWIRE ANG ZIPWIRE 038X150 (WIRE) ×3 IMPLANT
GUIDEWIRE STR DUAL SENSOR (WIRE) ×3 IMPLANT
MANIFOLD NEPTUNE II (INSTRUMENTS) ×3 IMPLANT
PACK CYSTO (CUSTOM PROCEDURE TRAY) ×3 IMPLANT
PAD TELFA 2X3 NADH STRL (GAUZE/BANDAGES/DRESSINGS) ×2 IMPLANT
SHEATH ACCESS URETERAL 24CM (SHEATH) ×2 IMPLANT
STENT CONTOUR 6FRX26X.038 (STENTS) ×2 IMPLANT
TUBE FEEDING 8FR 16IN STR KANG (MISCELLANEOUS) ×3 IMPLANT
TUBING CONNECTING 10 (TUBING) ×2 IMPLANT
TUBING CONNECTING 10' (TUBING) ×1

## 2014-06-03 NOTE — Brief Op Note (Signed)
06/03/2014  9:37 AM  PATIENT:  Olegario Shearer  80 y.o. male  PRE-OPERATIVE DIAGNOSIS:  LEFT URETERAL MASS  POST-OPERATIVE DIAGNOSIS:  LEFT URETERAL MASS  PROCEDURE:  Procedure(s): CYSTOSCOPY/RETROGRADE/URETEROSCOPY WITH BIOPSY,STENT EXCHANGE (Left)  SURGEON:  Surgeon(s) and Role:    * [name], MD - Primary  PHYSICIAN ASSISTANT:   ASSISTANTS: none   ANESTHESIA:   general  EBL:     BLOOD ADMINISTERED:none  DRAINS: none   LOCAL MEDICATIONS USED:  NONE  SPECIMEN:  Source of Specimen:  Lt ureteral mass brush biopsy, cup biopsy, and washing  DISPOSITION OF SPECIMEN:  PATHOLOGY  COUNTS:  YES  TOURNIQUET:  * No tourniquets in log *  DICTATION: .Other Dictation: Dictation Number [id number]  PLAN OF CARE: Discharge to home after PACU  PATIENT DISPOSITION:  PACU - hemodynamically stable.   Delay start of Pharmacological VTE agent (>24hrs) due to surgical blood loss or risk of bleeding: yes

## 2014-06-03 NOTE — Anesthesia Preprocedure Evaluation (Signed)
Anesthesia Evaluation  Patient identified by MRN, date of birth, ID band Patient awake    Reviewed: Allergy & Precautions, H&P , NPO status , Patient's Chart, lab work & pertinent test results  Airway Mallampati: II TM Distance: >3 FB Neck ROM: Full    Dental  (+) Poor Dentition Missing many teeth. Broken teeth at the gum line.:   Pulmonary neg pulmonary ROS,  breath sounds clear to auscultation  Pulmonary exam normal       Cardiovascular hypertension, Rhythm:Regular Rate:Normal     Neuro/Psych PSYCHIATRIC DISORDERS Depression negative neurological ROS     GI/Hepatic negative GI ROS, Neg liver ROS,   Endo/Other  negative endocrine ROS  Renal/GU negative Renal ROS  negative genitourinary   Musculoskeletal negative musculoskeletal ROS (+)   Abdominal   Peds negative pediatric ROS (+)  Hematology negative hematology ROS (+)   Anesthesia Other Findings   Reproductive/Obstetrics negative OB ROS                           Anesthesia Physical  Anesthesia Plan  ASA: II and emergent  Anesthesia Plan: General   Post-op Pain Management:    Induction: Intravenous  Airway Management Planned: LMA  Additional Equipment:   Intra-op Plan:   Post-operative Plan: Extubation in OR  Informed Consent: I have reviewed the patients History and Physical, chart, labs and discussed the procedure including the risks, benefits and alternatives for the proposed anesthesia with the patient or authorized representative who has indicated his/her understanding and acceptance.   Dental advisory given  Plan Discussed with: CRNA  Anesthesia Plan Comments:         Anesthesia Quick Evaluation

## 2014-06-03 NOTE — Progress Notes (Signed)
Dr. Manny talked with patient. 

## 2014-06-03 NOTE — H&P (Signed)
Steven Cain is an 80 y.o. male.    Chief Complaint: Pre-Op Left Ureteroscopy with Biopsy and Possible Laser Ablation  HPI:      1 - Obstructing Left Ureteral Mass - worrisome soft tissue mass appearing intraluminal left ureter by CT and operative retrograde pyelogram 9/23. Now s/p stenting. No bladder lesion or additional lesions by imaging. He has h/o extensive smokeless tobacco exposure.   2 - Incontinence - Long h/o near total incontinence s/p TURP previously by Dr. Rosana Hoes. Manges with adult diapers x years.  PMH sig for Billroth II procedure R+Y, bilateral inguinal hernia repair with mesh.  His PCP is Dr. Virgina Jock.  Today [name] is seen to proceed with left ureteroscopy for diagnosis and therapy of his left ureteral mass.  Past Medical History  Diagnosis Date  . Depression   . Hypertension     Past Surgical History  Procedure Laterality Date  . Abdominal surgery    . Transurethral resection of prostate    . Cystoscopy w/ ureteral stent placement Left 05/06/2014    Procedure: CYSTOSCOPY WITH RETROGRADE PYELOGRAM/URETERAL STENT PLACEMENT;  Surgeon: [name], MD;  Location: [facility];  Service: Urology;  Laterality: Left;    History reviewed. No pertinent family history. Social History:  reports that he has never smoked. His smokeless tobacco use includes Chew. He reports that he does not drink alcohol or use illicit drugs.  Allergies: No Known Allergies  No prescriptions prior to admission    No results found for this or any previous visit (from the past 48 hour(s)). Dg Chest 2 View  06/01/2014   CLINICAL DATA:  Preop chest radiograph.  Hypertension.  EXAM: CHEST  2 VIEW  COMPARISON:  01/11/2010 and CT stone study 05/06/2014  FINDINGS: Heart size is normal stable. Thoracic aorta contour is mildly ectatic/tortuous and stable. Slight prominence of the central pulmonary arteries is stable. There is flattening of the hemidiaphragms, similar appearance to prior chest  radiograph of 2011. Interstitial prominence in the periphery of both lung bases correlates with interstitial prominence of the seen at the lung bases on recent CT abdomen/pelvis. No superimposed airspace disease, pleural effusion, or pneumothorax.  There are degenerative changes of both shoulders, left greater than right. Multiple rounded calcific densities project over the left shoulder, likely calcified loose bodies, unchanged from prior chest radiograph of 2011.  A probable left ureteral stent is visualized in the upper abdomen, with the proximal coil in the expected location of the left kidney. Surgical clips near the gastroesophageal junction noted.  IMPRESSION: 1. Flattening of the hemidiaphragms is similar to prior chest radiographs may indicate COPD. 2. Interstitial prominence at the lung bases, for which chronic interstitial lung disease cannot be excluded. 3. No acute superimposed cardiopulmonary disease identified. 4. Left ureteral stent partially visualized.   Electronically Signed   By: Curlene Dolphin M.D.   On: 06/01/2014 13:24    Review of Systems  Constitutional: Negative.  Negative for fever and chills.  HENT: Negative.   Eyes: Negative.   Respiratory: Negative.   Cardiovascular: Negative.   Gastrointestinal: Negative.   Genitourinary: Negative.   Musculoskeletal: Negative.   Skin: Negative.   Neurological: Negative.   Endo/Heme/Allergies: Negative.   Psychiatric/Behavioral: Negative.     There were no vitals taken for this visit. Physical Exam  Constitutional: He is oriented to person, place, and time. He appears well-developed.  HENT:  Head: Normocephalic.  Eyes: Pupils are equal, round, and reactive to light.  Neck: Normal range of  motion. Neck supple.  Cardiovascular: Normal rate.   Respiratory: Effort normal.  GI: Soft. Bowel sounds are normal.  Genitourinary:  No CVAT  Musculoskeletal: Normal range of motion.  Neurological: He is alert and oriented to person,  place, and time.  Skin: Skin is warm and dry.  Psychiatric: He has a normal mood and affect. His behavior is normal. Judgment and thought content normal.     Assessment/Plan  1 - Obstructing Left Ureteral Mass - worrisome for clinically localized malignancy. Proceed with ureteroscopy as scheduled with goal of tissue diagnosis and possibly endoscopic ablation if appears amenable.  We rediscussed that endoscopic management of upper tract transitional cell carcinoma is typically best reserved for cases of high grade tumor and substantial comorbidity and low-grade tumors as it is clearly oncologically inferior to nephroureterectomy. We rediscussed goals of therapy including resection / ablation of all visible lesions with concomitant topical chemotherapy (mitomycin) if appears no evidence of gross urothelial violation in effort to maximally treat and prevent recurrence. We rediscussed risks including bleeding, infection, damage to kidney / ureter  bladder, rarely loss of kidney. We rediscussed anesthetic risks and rare but serious surgical complications including DVT, PE, MI, and mortality. We specificallyre addressed that in 5-10% of cases a staged approach is required with stenting followed by re-attempt ureteroscopy if anatomy unfavorable. The patient voiced understanding and wises to proceed.   2 - Incontinence - continue current regimen.   Steven Cain 06/03/2014, 6:13 AM

## 2014-06-03 NOTE — Discharge Instructions (Signed)
1 - You may have urinary urgency (bladder spasms) and bloody urine on / off with stent in place. This is normal. ° °2 - Call MD or go to ER for fever >102, severe pain / nausea / vomiting not relieved by medications, or acute change in medical status ° °

## 2014-06-03 NOTE — Anesthesia Postprocedure Evaluation (Signed)
  Anesthesia Post-op Note  Patient: Steven Cain  Procedure(s) Performed: Procedure(s) (LRB): CYSTOSCOPY/RETROGRADE/URETEROSCOPY WITH BIOPSY,STENT EXCHANGE (Left)  Patient Location: PACU  Anesthesia Type: General  Level of Consciousness: awake and alert   Airway and Oxygen Therapy: Patient Spontanous Breathing  Post-op Pain: mild  Post-op Assessment: Post-op Vital signs reviewed, Patient's Cardiovascular Status Stable, Respiratory Function Stable, Patent Airway and No signs of Nausea or vomiting  Last Vitals:  Filed Vitals:   06/03/14 1124  BP: 144/77  Pulse: 69  Temp: 36.2 C  Resp: 16    Post-op Vital Signs: stable   Complications: No apparent anesthesia complications

## 2014-06-03 NOTE — Op Note (Signed)
NAME:  Steven Cain, Steven Cain NO.:  [id number]  MEDICAL RECORD NO.:  [id number]  [name]                         FACILITY:  [facility]  PHYSICIAN:  [name], MD     DATE OF BIRTH:  22-Feb-1931  DATE OF PROCEDURE: 06/03/2014  DATE OF DISCHARGE:                              OPERATIVE REPORT   DIAGNOSIS:  Left distal ureteral obstruction, worrisome for possible cancer.  PROCEDURE: 1. Cystoscopy with left retrograde pyelogram and interpretation. 2. Left ureteroscopy with biopsy of ureteral mass. 3. Exchange of left ureteral stent, 6 x 26, no tether.  ESTIMATED BLOOD LOSS:  Nil.  COMPLICATIONS:  None.  SPECIMENS: 1. Left ureteral mass brush biopsy for cytology. 2. Left renal washings for cytology. 3. Left ureteral mass biopsy for permanent pathology.  FINDINGS:  Approximately 4 cm long area of narrowing in the left distal ureter with multifocal nodularity circumferentially worrisome for possible tumor.  No focal papillary masses.  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  He underwent stenting at that time for temporizing measure.  He now presents for left ureteroscopy with diagnostic intent.  Informed consent was obtained and placed in medical record.  PROCEDURE IN DETAIL:  [name], was verified. Procedure being left ureteroscopy with biopsy was confirmed.  Procedure was carried out.  Time-out was performed.  Intravenous antibiotics were administered.  General LMA anesthesia was introduced.  The patient was placed into a low lithotomy position.  Sterile field was created by prepping and draping the patient's penis, perineum, proximal thighs using iodine x3.  Next, cystourethroscopy was performed  using 22-French rigid cystoscope with 12-degree offset lens.  Inspection of his distal ureter revealed hypospadias as previously noted.  The prostatic urethra was wide open consistent with prior transurethral resection.  Inspection of the urinary bladder revealed no diverticula or papular lesions. Distal end of the stent was seen in situ, grasped, brought to the level of the urethral meatus.  A 0.038 Glidewire was advanced at the level of the upper pole, that was exchanged for an open end catheter and left retrograde pyelogram was obtained.  Left retrograde pyelogram demonstrated a single left ureter, single system left kidney.  There is narrowing in the distal ureter consistent with known stricture versus tumor.  There was no hydroureteronephrosis above this at this time.  The zip wire was once again advanced to the level of the upper pole and set aside as a safety wire.  Next, semi- rigid ureteroscopy was performed in the entire length of left ureter alongside a separate Sensor working wire.  Again, there was multifocal circumferential nodularity of the distal ureteral segment approximately 4 cm in length.  There were no obvious focal papillary or intraluminal lesions that appeared to be minimal to laser ablation.  There were no obvious lesions in the renal pelvis.  The semi-rigid ureteroscope was exchanged for a 24-cm short 12/14 ureteral access sheath at the level of the distal ureter and flexible  digital ureteroscopy was performed in the distal ureter.  The area of question was then brush biopsied vigorously with the brush biopsy apparatus, separated and set aside for permanent cytology in saline fluid.  The BIGopsy apparatus was backloaded into the flexible ureteroscope under direct vision and representative areas of the nodular ureteral wall for the biopsy and set aside for permanent pathology.  Finally, a washing was performed on the left renal pelvis allowing fluid to enter to  the ureteroscope and exit via the access sheath and set aside and labeled left renal washing.  Final ureteroscopic examination revealed no obvious perforation of the ureter.  Clearly given the relative narrowing, I felt that repeat stenting was warranted as such, using cystoscopic guidance, after removing the access sheath under continuous ureteroscopic guidance.  A new 6 x 26 Contour type stent was placed. Good proximal and distal deployment were noted.  Bladder was emptied per cystoscope.  Procedure was then terminated.  The patient tolerated the procedure well.  There were no immediate periprocedural complications. The patient was taken to the postanesthesia care unit in stable condition.          ______________________________ [name], MD     TM/MEDQ  D:  06/03/2014  T:  06/03/2014  Job:  [id number]

## 2014-06-03 NOTE — Transfer of Care (Signed)
Immediate Anesthesia Transfer of Care Note  Patient: Steven Cain  Procedure(s) Performed: Procedure(s): CYSTOSCOPY/RETROGRADE/URETEROSCOPY WITH BIOPSY,STENT EXCHANGE (Left)  Patient Location: PACU  Anesthesia Type:General  Level of Consciousness: awake, alert , oriented and patient cooperative  Airway & Oxygen Therapy: Patient Spontanous Breathing and Patient connected to face mask oxygen  Post-op Assessment: Report given to PACU RN, Post -op Vital signs reviewed and stable and Patient moving all extremities X 4  Post vital signs: stable  Complications: No apparent anesthesia complications

## 2014-06-04 ENCOUNTER — Encounter (HOSPITAL_COMMUNITY): Payer: Self-pay | Admitting: Urology

## 2014-08-14 DIAGNOSIS — Z9289 Personal history of other medical treatment: Secondary | ICD-10-CM

## 2014-08-14 HISTORY — DX: Personal history of other medical treatment: Z92.89

## 2014-09-08 ENCOUNTER — Other Ambulatory Visit: Payer: Self-pay | Admitting: Urology

## 2014-09-19 ENCOUNTER — Other Ambulatory Visit: Payer: Self-pay | Admitting: Internal Medicine

## 2014-09-19 DIAGNOSIS — M7989 Other specified soft tissue disorders: Secondary | ICD-10-CM

## 2014-09-21 ENCOUNTER — Ambulatory Visit (HOSPITAL_COMMUNITY)
Admission: RE | Admit: 2014-09-21 | Discharge: 2014-09-21 | Disposition: A | Discharge status: Home / Self Care | Payer: Medicare Other | Source: Ambulatory Visit | Attending: Surgery | Admitting: Surgery

## 2014-09-21 DIAGNOSIS — G629 Polyneuropathy, unspecified: Secondary | ICD-10-CM | POA: Diagnosis not present

## 2014-09-21 DIAGNOSIS — M7989 Other specified soft tissue disorders: Secondary | ICD-10-CM | POA: Diagnosis not present

## 2014-09-21 DIAGNOSIS — Z6828 Body mass index (BMI) 28.0-28.9, adult: Secondary | ICD-10-CM | POA: Diagnosis not present

## 2014-09-21 DIAGNOSIS — I10 Essential (primary) hypertension: Secondary | ICD-10-CM | POA: Diagnosis not present

## 2014-09-27 ENCOUNTER — Inpatient Hospital Stay (HOSPITAL_COMMUNITY)
Admission: EM | Admit: 2014-09-27 | Discharge: 2014-09-29 | DRG: 378 | Disposition: A | Discharge status: Home / Self Care | Payer: Medicare Other | Attending: Internal Medicine | Admitting: Internal Medicine

## 2014-09-27 ENCOUNTER — Encounter (HOSPITAL_COMMUNITY): Payer: Self-pay | Admitting: Emergency Medicine

## 2014-09-27 DIAGNOSIS — N183 Chronic kidney disease, stage 3 (moderate): Secondary | ICD-10-CM | POA: Diagnosis not present

## 2014-09-27 DIAGNOSIS — I129 Hypertensive chronic kidney disease with stage 1 through stage 4 chronic kidney disease, or unspecified chronic kidney disease: Secondary | ICD-10-CM | POA: Diagnosis present

## 2014-09-27 DIAGNOSIS — F419 Anxiety disorder, unspecified: Secondary | ICD-10-CM | POA: Diagnosis present

## 2014-09-27 DIAGNOSIS — Z79899 Other long term (current) drug therapy: Secondary | ICD-10-CM

## 2014-09-27 DIAGNOSIS — Z903 Acquired absence of stomach [part of]: Secondary | ICD-10-CM | POA: Diagnosis present

## 2014-09-27 DIAGNOSIS — Z800 Family history of malignant neoplasm of digestive organs: Secondary | ICD-10-CM

## 2014-09-27 DIAGNOSIS — Z8249 Family history of ischemic heart disease and other diseases of the circulatory system: Secondary | ICD-10-CM | POA: Diagnosis not present

## 2014-09-27 DIAGNOSIS — Z7982 Long term (current) use of aspirin: Secondary | ICD-10-CM | POA: Diagnosis not present

## 2014-09-27 DIAGNOSIS — Z8711 Personal history of peptic ulcer disease: Secondary | ICD-10-CM | POA: Diagnosis not present

## 2014-09-27 DIAGNOSIS — K2970 Gastritis, unspecified, without bleeding: Secondary | ICD-10-CM | POA: Diagnosis not present

## 2014-09-27 DIAGNOSIS — K2901 Acute gastritis with bleeding: Secondary | ICD-10-CM

## 2014-09-27 DIAGNOSIS — T45515A Adverse effect of anticoagulants, initial encounter: Secondary | ICD-10-CM | POA: Diagnosis present

## 2014-09-27 DIAGNOSIS — Z7901 Long term (current) use of anticoagulants: Secondary | ICD-10-CM | POA: Diagnosis not present

## 2014-09-27 DIAGNOSIS — F17220 Nicotine dependence, chewing tobacco, uncomplicated: Secondary | ICD-10-CM | POA: Diagnosis present

## 2014-09-27 DIAGNOSIS — D500 Iron deficiency anemia secondary to blood loss (chronic): Secondary | ICD-10-CM | POA: Diagnosis not present

## 2014-09-27 DIAGNOSIS — I951 Orthostatic hypotension: Secondary | ICD-10-CM | POA: Diagnosis present

## 2014-09-27 DIAGNOSIS — Z791 Long term (current) use of non-steroidal anti-inflammatories (NSAID): Secondary | ICD-10-CM

## 2014-09-27 DIAGNOSIS — Z833 Family history of diabetes mellitus: Secondary | ICD-10-CM | POA: Diagnosis not present

## 2014-09-27 DIAGNOSIS — Z66 Do not resuscitate: Secondary | ICD-10-CM | POA: Diagnosis present

## 2014-09-27 DIAGNOSIS — R791 Abnormal coagulation profile: Secondary | ICD-10-CM | POA: Diagnosis present

## 2014-09-27 DIAGNOSIS — F329 Major depressive disorder, single episode, unspecified: Secondary | ICD-10-CM | POA: Diagnosis present

## 2014-09-27 DIAGNOSIS — K922 Gastrointestinal hemorrhage, unspecified: Secondary | ICD-10-CM | POA: Diagnosis not present

## 2014-09-27 DIAGNOSIS — K635 Polyp of colon: Secondary | ICD-10-CM | POA: Diagnosis present

## 2014-09-27 DIAGNOSIS — D123 Benign neoplasm of transverse colon: Secondary | ICD-10-CM | POA: Diagnosis not present

## 2014-09-27 DIAGNOSIS — K2961 Other gastritis with bleeding: Secondary | ICD-10-CM

## 2014-09-27 DIAGNOSIS — Z8672 Personal history of thrombophlebitis: Secondary | ICD-10-CM | POA: Diagnosis not present

## 2014-09-27 DIAGNOSIS — K921 Melena: Principal | ICD-10-CM | POA: Diagnosis present

## 2014-09-27 DIAGNOSIS — R42 Dizziness and giddiness: Secondary | ICD-10-CM | POA: Diagnosis not present

## 2014-09-27 DIAGNOSIS — Z79891 Long term (current) use of opiate analgesic: Secondary | ICD-10-CM

## 2014-09-27 DIAGNOSIS — D62 Acute posthemorrhagic anemia: Secondary | ICD-10-CM

## 2014-09-27 DIAGNOSIS — N1832 Chronic kidney disease, stage 3b: Secondary | ICD-10-CM

## 2014-09-27 HISTORY — DX: Acute gastric ulcer with perforation: K25.1

## 2014-09-27 HISTORY — DX: Gastrointestinal hemorrhage, unspecified: K92.2

## 2014-09-27 LAB — CBC
HCT: 25.4 % — ABNORMAL LOW (ref 39.0–52.0)
HEMATOCRIT: 26.2 % — AB (ref 39.0–52.0)
HEMOGLOBIN: 8.2 g/dL — AB (ref 13.0–17.0)
Hemoglobin: 8.4 g/dL — ABNORMAL LOW (ref 13.0–17.0)
MCH: 29.2 pg (ref 26.0–34.0)
MCH: 29.3 pg (ref 26.0–34.0)
MCHC: 32.1 g/dL (ref 30.0–36.0)
MCHC: 32.3 g/dL (ref 30.0–36.0)
MCV: 91 fL (ref 78.0–100.0)
MCV: 90.7 fL (ref 78.0–100.0)
Platelets: 228 10*3/uL (ref 150–400)
Platelets: 216 10*3/uL (ref 150–400)
RBC: 2.88 MIL/uL — ABNORMAL LOW (ref 4.22–5.81)
RBC: 2.8 MIL/uL — ABNORMAL LOW (ref 4.22–5.81)
RDW: 14.2 % (ref 11.5–15.5)
RDW: 14.1 % (ref 11.5–15.5)
WBC: 9.4 10*3/uL (ref 4.0–10.5)
WBC: 9.2 10*3/uL (ref 4.0–10.5)

## 2014-09-27 LAB — COMPREHENSIVE METABOLIC PANEL
ALT: 15 U/L (ref 0–53)
AST: 26 U/L (ref 0–37)
Albumin: 3.4 g/dL — ABNORMAL LOW (ref 3.5–5.2)
Alkaline Phosphatase: 61 U/L (ref 39–117)
Anion gap: 7 (ref 5–15)
BILIRUBIN TOTAL: 0.5 mg/dL (ref 0.3–1.2)
BUN: 35 mg/dL — ABNORMAL HIGH (ref 6–23)
CALCIUM: 9.2 mg/dL (ref 8.4–10.5)
CHLORIDE: 105 mmol/L (ref 96–112)
CO2: 28 mmol/L (ref 19–32)
Creatinine, Ser: 1.31 mg/dL (ref 0.50–1.35)
GFR calc non Af Amer: 49 mL/min — ABNORMAL LOW (ref >90)
GFR, EST AFRICAN AMERICAN: 57 mL/min — AB (ref >90)
GLUCOSE: 117 mg/dL — AB (ref 70–99)
Potassium: 4.1 mmol/L (ref 3.5–5.1)
Sodium: 140 mmol/L (ref 135–145)
Total Protein: 6.7 g/dL (ref 6.0–8.3)

## 2014-09-27 LAB — SAMPLE TO BLOOD BANK

## 2014-09-27 LAB — CBC WITH DIFFERENTIAL/PLATELET
BASOS ABS: 0.1 10*3/uL (ref 0.0–0.1)
Basophils Relative: 1 % (ref 0–1)
Eosinophils Absolute: 0.3 10*3/uL (ref 0.0–0.7)
Eosinophils Relative: 3 % (ref 0–5)
HCT: 27.5 % — ABNORMAL LOW (ref 39.0–52.0)
Hemoglobin: 8.9 g/dL — ABNORMAL LOW (ref 13.0–17.0)
LYMPHS PCT: 23 % (ref 12–46)
Lymphs Abs: 2.4 10*3/uL (ref 0.7–4.0)
MCH: 29.4 pg (ref 26.0–34.0)
MCHC: 32.4 g/dL (ref 30.0–36.0)
MCV: 90.8 fL (ref 78.0–100.0)
MONOS PCT: 7 % (ref 3–12)
Monocytes Absolute: 0.8 10*3/uL (ref 0.1–1.0)
NEUTROS PCT: 66 % (ref 43–77)
Neutro Abs: 6.9 10*3/uL (ref 1.7–7.7)
Platelets: 217 10*3/uL (ref 150–400)
RBC: 3.03 MIL/uL — ABNORMAL LOW (ref 4.22–5.81)
RDW: 14.1 % (ref 11.5–15.5)
WBC: 10.4 10*3/uL (ref 4.0–10.5)

## 2014-09-27 LAB — MRSA PCR SCREENING: MRSA by PCR: NEGATIVE

## 2014-09-27 LAB — APTT: APTT: 27 s (ref 24–37)

## 2014-09-27 LAB — ABO/RH: ABO/RH(D): A POS

## 2014-09-27 LAB — PROTIME-INR
INR: 1.11 (ref 0.00–1.49)
PROTHROMBIN TIME: 14.4 s (ref 11.6–15.2)

## 2014-09-27 LAB — PREPARE RBC (CROSSMATCH)

## 2014-09-27 LAB — POC OCCULT BLOOD, ED: Fecal Occult Bld: POSITIVE — AB

## 2014-09-27 MED ORDER — PANTOPRAZOLE SODIUM 40 MG IV SOLR
80.0000 mg | Freq: Once | INTRAVENOUS | Status: AC
  Administered 2014-09-27: 80 mg via INTRAVENOUS
  Filled 2014-09-27: qty 80

## 2014-09-27 MED ORDER — PAROXETINE HCL 20 MG PO TABS
20.0000 mg | ORAL_TABLET | Freq: Every day | ORAL | Status: DC
  Administered 2014-09-27 – 2014-09-28 (×4): 20 mg via ORAL
  Filled 2014-09-27 (×15): qty 1

## 2014-09-27 MED ORDER — ALUM & MAG HYDROXIDE-SIMETH 200-200-20 MG/5ML PO SUSP: 30.0000 mL | Freq: Four times a day (QID) | ORAL | Status: DC | PRN

## 2014-09-27 MED ORDER — SODIUM CHLORIDE 0.9 % IV SOLN
INTRAVENOUS | Status: DC
  Administered 2014-09-27 – 2014-09-29 (×4): via INTRAVENOUS

## 2014-09-27 MED ORDER — SODIUM CHLORIDE 0.9 % IV SOLN
Freq: Once | INTRAVENOUS | Status: AC
  Administered 2014-09-27: 17:00:00 via INTRAVENOUS

## 2014-09-27 MED ORDER — SODIUM CHLORIDE 0.9 % IV SOLN
8.0000 mg/h | INTRAVENOUS | Status: DC
  Administered 2014-09-28 – 2014-09-29 (×3): 8 mg/h via INTRAVENOUS
  Filled 2014-09-27 (×9): qty 80

## 2014-09-27 MED ORDER — SODIUM CHLORIDE 0.9 % IV SOLN: Freq: Once | INTRAVENOUS | Status: DC

## 2014-09-27 MED ORDER — ONDANSETRON HCL 4 MG PO TABS: 4.0000 mg | ORAL_TABLET | Freq: Four times a day (QID) | ORAL | Status: DC | PRN

## 2014-09-27 MED ORDER — SODIUM CHLORIDE 0.9 % IV BOLUS (SEPSIS): 1000.0000 mL | INTRAVENOUS | Status: DC | PRN

## 2014-09-27 MED ORDER — ONDANSETRON HCL 4 MG/2ML IJ SOLN: 4.0000 mg | Freq: Four times a day (QID) | INTRAMUSCULAR | Status: DC | PRN

## 2014-09-27 NOTE — H&P (Addendum)
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  He was started on Xarelto last weekend (Sunday) for right hand/ arm swelling with a suspicion he had a DVT. A venous duplex was negative on Monday and therefore Xarelto has been discontinued. He continued to take Ibuprofen and Aspirin for pain  and began having black stool on Wednesday (5 days ago) which have continued. His swelling in the right arm is gone but he is now lightheaded and feels weak- h/o GI bleed in 2004- no source was found at that time.    General: The patient denies anorexia, fever, weight loss Cardiac: Denies chest pain, syncope, palpitations, pedal edema  Respiratory: Denies cough, shortness of breath, wheezing GI: Denies severe indigestion/heartburn, abdominal pain, nausea, vomiting, diarrhea and constipation GU: Denies hematuria, incontinence, dysuria  Musculoskeletal: Denies arthritis  Skin: Denies suspicious skin lesions Neurologic: Denies focal weakness - states he has severe paraesthesias which are moderately controlled by NSAIDS Psychiatry: mild depression and anxiety. Hematologic: no easy bruising or bleeding   Past Medical History  Diagnosis Date  . Depression   . Hypertension   . Perforated, severe stomach ulcer   . GI bleed     Past Surgical History  Procedure Laterality Date  . Abdominal surgery    . Transurethral resection of prostate    . Cystoscopy w/ ureteral stent placement Left 05/06/2014    Procedure: CYSTOSCOPY WITH RETROGRADE PYELOGRAM/URETERAL STENT PLACEMENT;  Surgeon: [name], MD;  Location: [facility];  Service: Urology;  Laterality: Left;  . Cystoscopy/retrograde/ureteroscopy Left 06/03/2014    Procedure:  CYSTOSCOPY/RETROGRADE/URETEROSCOPY WITH BIOPSY,STENT EXCHANGE;  Surgeon: [name], MD;  Location: [facility];  Service: Urology;  Laterality: Left;  . Gastric roux-en-y      Social History: does not smoke or drink alcohol- chews tobacco Lives at home with wife and son.     No Known Allergies  Family history. Mother had colon cancer in late 73s- father had CAD in late 39s- grandparents had DM   Prior to Admission medications   Medication Sig Start Date End Date Taking? Authorizing Provider  Ascorbic Acid (VITAMIN C) 100 MG tablet Take 100 mg by mouth every morning.    Yes Historical Provider, MD  aspirin 325 MG tablet Take 325 mg by mouth every morning.    Yes Historical Provider, MD  co-enzyme Q-10 30 MG capsule Take 30 mg by mouth every morning.    Yes Historical Provider, MD  Cyanocobalamin (VITAMIN B-12 IJ) Inject 1 mL as directed every 30 (thirty) days.   Yes Historical Provider, MD  fish oil-omega-3 fatty acids 1000 MG capsule Take 2 g by mouth every morning.    Yes Historical Provider, MD  ibuprofen (ADVIL,MOTRIN) 200 MG tablet Take 200 mg by mouth once.   Yes Historical Provider, MD  meclizine (ANTIVERT) 25 MG tablet Take 25 mg by mouth 2 (two) times daily as needed for dizziness.   Yes Historical Provider, MD  Multiple Vitamin (MULTIVITAMIN WITH MINERALS) TABS tablet Take 1 tablet by mouth every morning.   Yes Historical Provider, MD  naproxen sodium (ANAPROX) 220 MG tablet Take 220 mg by mouth 2 (two) times daily.   Yes Historical Provider, MD  PARoxetine (PAXIL) 20  MG tablet Take 20 mg by mouth 6 (six) times daily.    Yes Historical Provider, MD  rivaroxaban (XARELTO) 10 MG TABS tablet Take 10 mg by mouth.   Yes Historical Provider, MD  testosterone cypionate (DEPOTESTOTERONE CYPIONATE) 200 MG/ML injection Inject 100 mg into the muscle every 30 (thirty) days. 3/10 of cc.   Yes Historical Provider, MD  vitamin E 100 UNIT capsule Take 100 Units by mouth every morning.    Yes  Historical Provider, MD  oxyCODONE-acetaminophen (ROXICET) 5-325 MG per tablet Take 1 tablet by mouth every 6 (six) hours as needed for moderate pain or severe pain. Post-operatively Patient not taking: Reported on 09/27/2014 06/03/14   [name], MD  senna-docusate (SENOKOT-S) 8.6-50 MG per tablet Take 1 tablet by mouth 2 (two) times daily. While taking pain meds to prevent constipation Patient not taking: Reported on 09/27/2014 06/03/14   [name], MD     Physical Exam: Filed Vitals:   09/27/14 1305  BP: 152/63  Pulse: 87  TempSrc: Oral  Resp: 20  SpO2: 100%     General: AAO x 3, no distress HEENT: Normocephalic and Atraumatic, Mucous membranes pink- pale conjunctiva, dry oral mucosa                PERRLA; EOM intact; No scleral icterus,                 Nares: Patent, Oropharynx: Clear, Fair Dentition                 Neck: FROM, no cervical lymphadenopathy, thyromegaly, carotid bruit or JVD;  Breasts: deferred CHEST WALL: No tenderness  CHEST: Normal respiration, clear to auscultation bilaterally  HEART: Regular rate and rhythm; no murmurs rubs or gallops  BACK: No kyphosis or scoliosis; no CVA tenderness  GI: Positive Bowel Sounds, soft, non-tender; no masses, no organomegaly Rectal Exam: deferred MSK: No cyanosis, clubbing, or edema Genitalia: not examined  SKIN:  no rash or ulceration  CNS: Alert and Oriented x 4, Nonfocal exam, CN 2-12 intact  Labs on Admission:  Basic Metabolic Panel:  Recent Labs Lab 09/27/14 1316  NA 140  K 4.1  CL 105  CO2 28  GLUCOSE 117*  BUN 35*  CREATININE 1.31  CALCIUM 9.2   Liver Function Tests:  Recent Labs Lab 09/27/14 1316  AST 26  ALT 15  ALKPHOS 61  BILITOT 0.5  PROT 6.7  ALBUMIN 3.4*   No results for input(s): LIPASE, AMYLASE in the last 168 hours. No results for input(s): AMMONIA in the last 168 hours. CBC:  Recent Labs Lab 09/27/14 1316  WBC 10.4  NEUTROABS 6.9  HGB 8.9*  HCT 27.5*  MCV 90.8   PLT 217   Cardiac Enzymes: No results for input(s): CKTOTAL, CKMB, CKMBINDEX, TROPONINI in the last 168 hours.  BNP (last 3 results) No results for input(s): BNP in the last 8760 hours.  ProBNP (last 3 results) No results for input(s): PROBNP in the last 8760 hours.  CBG: No results for input(s): GLUCAP in the last 168 hours.  Radiological Exams on Admission: No results found.  EKG: Independently reviewed. Sinus rhythm - no acute abnormalities   Assessment/Plan Principal Problem:  Melena x 5 days - start Protonix bolus and infusion- watch in SDU- likely related to recent NSAID use - GI consulted - NPO    Anemia associated with acute blood loss -hold 1 U of PRBC for transfusion- when Hb drops below 8 , he can be transfused -  last Hb in 10/15 was 13 - check Hb every 6hrs  Orthostatic hypotension - give 1L of NS and then 125 cc/hr    CKD (chronic kidney disease) stage 3, GFR 30-59 ml/min -follow  Depression -take an odd dose of Paxil 20 mg 6 times a day- I have ordered this  H/o Paresthesias -outpt f/u    Consulted:   Code Status: DNR  Family Communication: wife and daughter  DVT Prophylaxis:SCDs  Time spent: 46 min  [place], MD [facility]  If 7PM-7AM, please contact night-coverage [email or web address] 09/27/2014, 3:18 PM

## 2014-09-27 NOTE — ED Notes (Signed)
Pt's daughter states that pt has been being treated for DVT to rt arm for which he has been complaining of paraesthesias and swelling.  Pt has been taking NSAIDS and blood thinners.  Hx of GI bleeding in the past.  2 day hx of dark, tarry stools and weakness.

## 2014-09-27 NOTE — ED Provider Notes (Signed)
CSN: [id number]     Arrival date & time 09/27/14  1304 History   First MD Initiated Contact with Patient 09/27/14 1306     Chief Complaint  Patient presents with  . GI Bleeding     (Consider location/radiation/quality/duration/timing/severity/associated sxs/prior Treatment) The history is provided by the patient.   patient with likely GI bleeding. Has had black stools for the last few days. He's been feeling more lightheaded. He does have a history of GI bleeds in the past. He has had a partial gastrectomy due to it. He had been on anticoagulation briefly for swelling in his hand. Also his chronic NSAID use. Complaining of some lightheadedness. Complaining of a paresthesia down his right arm and somewhat in his right leg. Has been there for a while. Recently negative Doppler for swelling in his right hand. Some occasional mild abdominal pain.  Past Medical History  Diagnosis Date  . Depression   . Hypertension   . Perforated, severe stomach ulcer   . GI bleed    Past Surgical History  Procedure Laterality Date  . Abdominal surgery    . Transurethral resection of prostate    . Cystoscopy w/ ureteral stent placement Left 05/06/2014    Procedure: CYSTOSCOPY WITH RETROGRADE PYELOGRAM/URETERAL STENT PLACEMENT;  Surgeon: [name], MD;  Location: [facility];  Service: Urology;  Laterality: Left;  . Cystoscopy/retrograde/ureteroscopy Left 06/03/2014    Procedure: CYSTOSCOPY/RETROGRADE/URETEROSCOPY WITH BIOPSY,STENT EXCHANGE;  Surgeon: [name], MD;  Location: [facility];  Service: Urology;  Laterality: Left;  . Gastric roux-en-y     History reviewed. No pertinent family history. History  Substance Use Topics  . Smoking status: Never Smoker   . Smokeless tobacco: Current User    Types: Chew  . Alcohol Use: No    Review of Systems  Constitutional: Positive for fatigue. Negative for activity change and appetite change.  Eyes: Negative for pain.  Respiratory: Negative for chest  tightness and shortness of breath.   Cardiovascular: Negative for chest pain and leg swelling.  Gastrointestinal: Positive for abdominal pain and blood in stool. Negative for nausea, vomiting and diarrhea.  Genitourinary: Negative for flank pain.  Musculoskeletal: Negative for back pain and neck stiffness.  Skin: Negative for rash.  Neurological: Positive for light-headedness and numbness. Negative for weakness and headaches.  Psychiatric/Behavioral: Negative for behavioral problems.      Allergies  Review of patient's allergies indicates no known allergies.  Home Medications   Prior to Admission medications   Medication Sig Start Date End Date Taking? Authorizing Provider  Ascorbic Acid (VITAMIN C) 100 MG tablet Take 100 mg by mouth every morning.    Yes Historical Provider, MD  aspirin 325 MG tablet Take 325 mg by mouth every morning.    Yes Historical Provider, MD  co-enzyme Q-10 30 MG capsule Take 30 mg by mouth every morning.    Yes Historical Provider, MD  Cyanocobalamin (VITAMIN B-12 IJ) Inject 1 mL as directed every 30 (thirty) days.   Yes Historical Provider, MD  fish oil-omega-3 fatty acids 1000 MG capsule Take 2 g by mouth every morning.    Yes Historical Provider, MD  ibuprofen (ADVIL,MOTRIN) 200 MG tablet Take 200 mg by mouth once.   Yes Historical Provider, MD  meclizine (ANTIVERT) 25 MG tablet Take 25 mg by mouth 2 (two) times daily as needed for dizziness.   Yes Historical Provider, MD  Multiple Vitamin (MULTIVITAMIN WITH MINERALS) TABS tablet Take 1 tablet by mouth every morning.   Yes Historical  Provider, MD  naproxen sodium (ANAPROX) 220 MG tablet Take 220 mg by mouth 2 (two) times daily.   Yes Historical Provider, MD  PARoxetine (PAXIL) 20 MG tablet Take 20 mg by mouth 6 (six) times daily.    Yes Historical Provider, MD  rivaroxaban (XARELTO) 10 MG TABS tablet Take 10 mg by mouth.   Yes Historical Provider, MD  testosterone cypionate (DEPOTESTOTERONE CYPIONATE) 200  MG/ML injection Inject 100 mg into the muscle every 30 (thirty) days. 3/10 of cc.   Yes Historical Provider, MD  vitamin E 100 UNIT capsule Take 100 Units by mouth every morning.    Yes Historical Provider, MD  oxyCODONE-acetaminophen (ROXICET) 5-325 MG per tablet Take 1 tablet by mouth every 6 (six) hours as needed for moderate pain or severe pain. Post-operatively Patient not taking: Reported on 09/27/2014 06/03/14   [name], MD  senna-docusate (SENOKOT-S) 8.6-50 MG per tablet Take 1 tablet by mouth 2 (two) times daily. While taking pain meds to prevent constipation Patient not taking: Reported on 09/27/2014 06/03/14   [name], MD   BP 136/63 mmHg  Pulse 68  Resp 16  SpO2 100% Physical Exam  Constitutional: He is oriented to person, place, and time. He appears well-developed and well-nourished.  HENT:  Head: Normocephalic and atraumatic.  Neck: Normal range of motion.  Cardiovascular: Normal rate, regular rhythm and normal heart sounds.   No murmur heard. Pulmonary/Chest: Effort normal and breath sounds normal.  Abdominal: Soft. Bowel sounds are normal. He exhibits no distension and no mass. There is no tenderness. There is no rebound and no guarding.  Genitourinary: Guaiac positive stool.  Small amount of black stool on finger from rectal exam.  Musculoskeletal: Normal range of motion. He exhibits no edema.  Neurological: He is alert and oriented to person, place, and time. No cranial nerve deficit.  Skin: Skin is warm and dry. There is pallor.  Hands are pale.  Psychiatric: He has a normal mood and affect.  Nursing note and vitals reviewed.   ED Course  Procedures (including critical care time) Labs Review Labs Reviewed  CBC WITH DIFFERENTIAL/PLATELET - Abnormal; Notable for the following:    RBC 3.03 (*)    Hemoglobin 8.9 (*)    HCT 27.5 (*)    All other components within normal limits  COMPREHENSIVE METABOLIC PANEL - Abnormal; Notable for the following:     Glucose, Bld 117 (*)    BUN 35 (*)    Albumin 3.4 (*)    GFR calc non Af Amer 49 (*)    GFR calc Af Amer 57 (*)    All other components within normal limits  POC OCCULT BLOOD, ED - Abnormal; Notable for the following:    Fecal Occult Bld POSITIVE (*)    All other components within normal limits  PROTIME-INR  APTT  CBC  CBC  CBC  SAMPLE TO BLOOD BANK  TYPE AND SCREEN  PREPARE RBC (CROSSMATCH)  PREPARE RBC (CROSSMATCH)  ABO/RH    Imaging Review No results found.   EKG Interpretation None      MDM   Final diagnoses:  Gastrointestinal hemorrhage associated with other gastritis  Acute posthemorrhagic anemia   Patient with GI bleed. Anemic but overall well-appearing. Has previous GI bleeds. Hemoglobin is 8.9. Will transfuse 2 units and admit to internal medicine. Discussed with Dr. Haynes Kerns, who requested admission to the hospitalist. I also discussed with Dr. Olevia Perches will see the patient in consult.     Steven Cain.  Steven Chapel, MD 09/27/14 [phone]

## 2014-09-27 NOTE — Consult Note (Signed)
   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 He was started on Xarelto 10 mg qd x3 days ago for right arm  Phlebitis. He has been taking ASA 325 mg daily, Aleve and Ibuprofen  For pain control. He has a hx of PUD, V&P 1964, and ROUX-EN-Y gastrojejunostomy in 1978. Last EGD/colon by Dr Collene Mares around 2003. He does not take any acid suppressing agents.on regular basis. He has a hx of alcohol abuse in the past but not in recent years.   Past Medical History  Diagnosis Date  . Depression   . Hypertension   . Perforated, severe stomach ulcer   . GI bleed    Past Surgical History  Procedure Laterality Date  . Abdominal surgery    . Transurethral resection of prostate    . Cystoscopy w/ ureteral stent placement Left 05/06/2014    Procedure: CYSTOSCOPY WITH RETROGRADE PYELOGRAM/URETERAL STENT PLACEMENT;  Surgeon: [name], MD;  Location: [facility];  Service: Urology;  Laterality: Left;  . Cystoscopy/retrograde/ureteroscopy Left 06/03/2014    Procedure: CYSTOSCOPY/RETROGRADE/URETEROSCOPY WITH BIOPSY,STENT EXCHANGE;  Surgeon: [name], MD;  Location: [facility];  Service: Urology;  Laterality: Left;  . Gastric roux-en-y      reports that he has never smoked. His smokeless tobacco use includes Chew. He reports that he does not drink alcohol or use illicit drugs. family history is not on file. No Known Allergies      Review of Systems:denies abdominal pain, dizziness, weight loss  The remainder of the 10 point ROS is negative except as outlined in H&P   Physical Exam: General appearance  Thin,, in no distress. Eyes- non icteric. HEENT nontraumatic, normocephalic. Mouth no lesions, tongue papillated, no cheilosis. Neck supple without adenopathy, thyroid not enlarged, no carotid bruits, no JVD. Lungs Clear to auscultation bilaterally. Cor normal S1, normal S2,  rapid regular rhythm, no murmur,  quiet precordium. Abdomen: soft, relaxed, non - tender, normal Bs's Rectal:not done Extremities no pedal edema. Skin no lesions. Neurological alert and oriented x 3. Psychological normal mood and affect.  Assessment and Plan:  81 yo WM with an acute UGIB demonstrated by melenic stools and anemia. He has coagulopathy secondary to Xarelto. He is hemodynamically stable Hx of PUD, V&P, Roux-en-Y g-j, r/o marginal ulcer , gastritis, r/o jejunal ulcer, due for screening colonoscopy   Recent excess intake of ASA,Ibuprofen and Aleve for right arm thrombophlebitis Plan to proceed with EGD in am - Dr Collene Mares C,lear liquids tonight Follow H/H,  stopXarelto PPI IV  09/27/2014 Delfin Edis

## 2014-09-27 NOTE — ED Notes (Signed)
Bed: RESA Expected date:  Expected time:  Means of arrival:  Comments: GI Bleed

## 2014-09-28 ENCOUNTER — Encounter (HOSPITAL_COMMUNITY): Payer: Self-pay | Admitting: *Deleted

## 2014-09-28 ENCOUNTER — Encounter (HOSPITAL_COMMUNITY)
Admission: EM | Disposition: A | Discharge status: Home / Self Care | Payer: BLUE CROSS/BLUE SHIELD | Source: Home / Self Care | Attending: Internal Medicine

## 2014-09-28 DIAGNOSIS — K921 Melena: Principal | ICD-10-CM

## 2014-09-28 DIAGNOSIS — R42 Dizziness and giddiness: Secondary | ICD-10-CM

## 2014-09-28 HISTORY — PX: ESOPHAGOGASTRODUODENOSCOPY: SHX5428

## 2014-09-28 LAB — BASIC METABOLIC PANEL
Anion gap: 6 (ref 5–15)
BUN: 28 mg/dL — ABNORMAL HIGH (ref 6–23)
CO2: 27 mmol/L (ref 19–32)
CREATININE: 1.3 mg/dL (ref 0.50–1.35)
Calcium: 8.3 mg/dL — ABNORMAL LOW (ref 8.4–10.5)
Chloride: 103 mmol/L (ref 96–112)
GFR calc Af Amer: 58 mL/min — ABNORMAL LOW (ref >90)
GFR, EST NON AFRICAN AMERICAN: 50 mL/min — AB (ref >90)
Glucose, Bld: 110 mg/dL — ABNORMAL HIGH (ref 70–99)
Potassium: 4.1 mmol/L (ref 3.5–5.1)
Sodium: 136 mmol/L (ref 135–145)

## 2014-09-28 LAB — CBC
HCT: 23.9 % — ABNORMAL LOW (ref 39.0–52.0)
HCT: 23.3 % — ABNORMAL LOW (ref 39.0–52.0)
HCT: 25.2 % — ABNORMAL LOW (ref 39.0–52.0)
HEMATOCRIT: 25.9 % — AB (ref 39.0–52.0)
HEMOGLOBIN: 8.4 g/dL — AB (ref 13.0–17.0)
Hemoglobin: 7.7 g/dL — ABNORMAL LOW (ref 13.0–17.0)
Hemoglobin: 7.2 g/dL — ABNORMAL LOW (ref 13.0–17.0)
Hemoglobin: 8.4 g/dL — ABNORMAL LOW (ref 13.0–17.0)
MCH: 29.3 pg (ref 26.0–34.0)
MCH: 28.3 pg (ref 26.0–34.0)
MCH: 29.4 pg (ref 26.0–34.0)
MCH: 30.1 pg (ref 26.0–34.0)
MCHC: 32.2 g/dL (ref 30.0–36.0)
MCHC: 30.9 g/dL (ref 30.0–36.0)
MCHC: 32.4 g/dL (ref 30.0–36.0)
MCHC: 33.3 g/dL (ref 30.0–36.0)
MCV: 90.9 fL (ref 78.0–100.0)
MCV: 91.7 fL (ref 78.0–100.0)
MCV: 90.6 fL (ref 78.0–100.0)
MCV: 90.3 fL (ref 78.0–100.0)
PLATELETS: 184 10*3/uL (ref 150–400)
PLATELETS: 189 10*3/uL (ref 150–400)
PLATELETS: 178 10*3/uL (ref 150–400)
Platelets: 175 10*3/uL (ref 150–400)
RBC: 2.63 MIL/uL — ABNORMAL LOW (ref 4.22–5.81)
RBC: 2.54 MIL/uL — ABNORMAL LOW (ref 4.22–5.81)
RBC: 2.86 MIL/uL — AB (ref 4.22–5.81)
RBC: 2.79 MIL/uL — ABNORMAL LOW (ref 4.22–5.81)
RDW: 14.3 % (ref 11.5–15.5)
RDW: 14.4 % (ref 11.5–15.5)
RDW: 14.2 % (ref 11.5–15.5)
RDW: 14.2 % (ref 11.5–15.5)
WBC: 6.7 10*3/uL (ref 4.0–10.5)
WBC: 6.1 10*3/uL (ref 4.0–10.5)
WBC: 6.2 10*3/uL (ref 4.0–10.5)
WBC: 7.1 10*3/uL (ref 4.0–10.5)

## 2014-09-28 SURGERY — EGD (ESOPHAGOGASTRODUODENOSCOPY)
Anesthesia: Moderate Sedation

## 2014-09-28 MED ORDER — PEG 3350-KCL-NA BICARB-NACL 420 G PO SOLR
4000.0000 mL | Freq: Once | ORAL | Status: DC
  Filled 2014-09-28: qty 4000

## 2014-09-28 MED ORDER — MIDAZOLAM HCL 10 MG/2ML IJ SOLN
INTRAMUSCULAR | Status: AC
  Filled 2014-09-28: qty 2

## 2014-09-28 MED ORDER — PEG 3350-KCL-NA BICARB-NACL 420 G PO SOLR
4000.0000 mL | Freq: Once | ORAL | Status: AC
  Administered 2014-09-28: 4000 mL via ORAL

## 2014-09-28 MED ORDER — SODIUM CHLORIDE 0.9 % IV SOLN: INTRAVENOUS | Status: DC

## 2014-09-28 MED ORDER — MIDAZOLAM HCL 10 MG/2ML IJ SOLN
INTRAMUSCULAR | Status: DC | PRN
  Administered 2014-09-28: 2 mg via INTRAVENOUS
  Administered 2014-09-28: 1 mg via INTRAVENOUS

## 2014-09-28 MED ORDER — FENTANYL CITRATE 0.05 MG/ML IJ SOLN
INTRAMUSCULAR | Status: AC
  Filled 2014-09-28: qty 2

## 2014-09-28 MED ORDER — SODIUM CHLORIDE 0.9 % IV SOLN
Freq: Once | INTRAVENOUS | Status: AC
  Administered 2014-09-28: 10:00:00 via INTRAVENOUS

## 2014-09-28 MED ORDER — DIPHENHYDRAMINE HCL 50 MG/ML IJ SOLN
INTRAMUSCULAR | Status: AC
  Filled 2014-09-28: qty 1

## 2014-09-28 MED ORDER — FENTANYL CITRATE 0.05 MG/ML IJ SOLN
INTRAMUSCULAR | Status: DC | PRN
  Administered 2014-09-28: 25 ug via INTRAVENOUS
  Administered 2014-09-28: 12.5 ug via INTRAVENOUS

## 2014-09-28 NOTE — Interval H&P Note (Signed)
History and Physical Interval Note:  09/28/2014 12:58 PM  Steven Cain  has presented today for surgery, with the diagnosis of melena  The various methods of treatment have been discussed with the patient and family. After consideration of risks, benefits and other options for treatment, the patient has consented to  Procedure(s): ESOPHAGOGASTRODUODENOSCOPY (EGD) (N/A) as a surgical intervention .  The patient's history has been reviewed, patient examined, no change in status, stable for surgery.  I have reviewed the patient's chart and labs.  Questions were answered to the patient's satisfaction.     [name] D

## 2014-09-28 NOTE — Progress Notes (Signed)
CARE MANAGEMENT NOTE 09/28/2014  Patient:  Steven Cain, Steven Cain   Account Number:  [id number]  Date Initiated:  09/28/2014  Documentation initiated by:  Vearl Aitken  Subjective/Objective Assessment:   pt with hx of gastectomy and now gi bleed, hypotensive and abd pain,     Action/Plan:   home when stable   Anticipated DC Date:  10/01/2014   Anticipated DC Plan:  HOME/SELF CARE  In-house referral  NA         Choice offered to / List presented to:             Status of service:  In process, will continue to follow Medicare Important Message given?   (If response is "NO", the following Medicare IM given date fields will be blank) Date Medicare IM given:   Medicare IM given by:   Date Additional Medicare IM given:   Additional Medicare IM given by:    Discharge Disposition:    Per UR Regulation:  Reviewed for med. necessity/level of care/duration of stay  If discussed at [place] of Stay Meetings, dates discussed:    Comments:  Feb. 15 2016/Saba Neuman L. Rosana Hoes, RN, BSN, CCM/Case Management [place] [phone] No discharge needs present of time of review.

## 2014-09-28 NOTE — Op Note (Signed)
[facility] [place], [address]   ENDOSCOPY PROCEDURE REPORT  PATIENT: Steven Cain, Steven Cain  MR#: [id number] BIRTHDATE: 24-Sep-1930 , 83  yrs. old GENDER: male ENDOSCOPIST:Taletha Twiford Benson [place], MD REFERRED BY: PROCEDURE DATE:  Oct 07, 2014 PROCEDURE:   EGD, diagnostic ASA CLASS:    Class III INDICATIONS: Melena and anemia MEDICATION: Fentanyl 50 mcg IV and Versed 3 mg IV TOPICAL ANESTHETIC:   none  DESCRIPTION OF PROCEDURE:   After the risks and benefits of the procedure were explained, informed consent was obtained.  The Pentax Gastroscope [id number]  endoscope was introduced through the mouth  and advanced to the second portion of the duodenum .  The instrument was slowly withdrawn as the mucosa was fully examined.      FINDINGS: The esophagus was normal and the Z-line was sharp.  In the gastric lumen the Billroth II was intact.  The efferent limb was traversed and the afferent limb was a blind pouch.  No evidence of any inflammation, ulcerations, erosions, polyps, masses, or vascular abnormalities.  Retroflexed views revealed no abnormalities.    The scope was then withdrawn from the patient and the procedure completed.  COMPLICATIONS: None  ENDOSCOPIC IMPRESSION: 1) Intact Billroth II.  No overt evidence of bleeding from the upper GI tract. RECOMMENDATIONS: 1) Repeat the colonoscopy tomorrow.   _______________________________ eSignedCarol Ada, MD Oct 07, 2014 1:28 PM     cc:  CPT CODES: ICD CODES:  The ICD and CPT codes recommended by this software are interpretations from the data that the clinical staff has captured with the software.  The verification of the translation of this report to the ICD and CPT codes and modifiers is the sole responsibility of the health care institution and practicing physician where this report was generated.  [place]. will not be held responsible for the validity of the ICD and CPT  codes included on this report.  AMA assumes no liability for data contained or not contained herein. CPT is a Designer, television/film set of the [employer].

## 2014-09-28 NOTE — H&P (View-Only) (Signed)
   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 He was started on Xarelto 10 mg qd x3 days ago for right arm  Phlebitis. He has been taking ASA 325 mg daily, Aleve and Ibuprofen  For pain control. He has a hx of PUD, V&P 1964, and ROUX-EN-Y gastrojejunostomy in 1978. Last EGD/colon by Dr Collene Mares around 2003. He does not take any acid suppressing agents.on regular basis. He has a hx of alcohol abuse in the past but not in recent years.   Past Medical History  Diagnosis Date  . Depression   . Hypertension   . Perforated, severe stomach ulcer   . GI bleed    Past Surgical History  Procedure Laterality Date  . Abdominal surgery    . Transurethral resection of prostate    . Cystoscopy w/ ureteral stent placement Left 05/06/2014    Procedure: CYSTOSCOPY WITH RETROGRADE PYELOGRAM/URETERAL STENT PLACEMENT;  Surgeon: [name], MD;  Location: [facility];  Service: Urology;  Laterality: Left;  . Cystoscopy/retrograde/ureteroscopy Left 06/03/2014    Procedure: CYSTOSCOPY/RETROGRADE/URETEROSCOPY WITH BIOPSY,STENT EXCHANGE;  Surgeon: [name], MD;  Location: [facility];  Service: Urology;  Laterality: Left;  . Gastric roux-en-y      reports that he has never smoked. His smokeless tobacco use includes Chew. He reports that he does not drink alcohol or use illicit drugs. family history is not on file. No Known Allergies      Review of Systems:denies abdominal pain, dizziness, weight loss  The remainder of the 10 point ROS is negative except as outlined in H&P   Physical Exam: General appearance  Thin,, in no distress. Eyes- non icteric. HEENT nontraumatic, normocephalic. Mouth no lesions, tongue papillated, no cheilosis. Neck supple without adenopathy, thyroid not enlarged, no carotid bruits, no JVD. Lungs Clear to auscultation bilaterally. Cor normal S1, normal S2,  rapid regular rhythm, no murmur,  quiet precordium. Abdomen: soft, relaxed, non - tender, normal Bs's Rectal:not done Extremities no pedal edema. Skin no lesions. Neurological alert and oriented x 3. Psychological normal mood and affect.  Assessment and Plan:  81 yo WM with an acute UGIB demonstrated by melenic stools and anemia. He has coagulopathy secondary to Xarelto. He is hemodynamically stable Hx of PUD, V&P, Roux-en-Y g-j, r/o marginal ulcer , gastritis, r/o jejunal ulcer, due for screening colonoscopy   Recent excess intake of ASA,Ibuprofen and Aleve for right arm thrombophlebitis Plan to proceed with EGD in am - Dr Collene Mares C,lear liquids tonight Follow H/H,  stopXarelto PPI IV  09/27/2014 Delfin Edis

## 2014-09-28 NOTE — Progress Notes (Signed)
[facility] Progress Note   ALPHONZA TRAMELL NTI:[id number] DOB: [birth year] DOA: 09/27/2014 PCP: Precious Reel, MD  Brief narrative: Steven Cain is a 81 y.o. male presenting with Melena x 5 days, orthostatic symptoms and anemia.    Subjective: No further melena since being admitted to the hospital   Assessment/Plan: Principal Problem:   GI bleed -no abdominal pain- bleed seems to have abated- h/o gastrectomy for perforated gastric ulcer in 1974 - for EGD today - cont PPI  Active Problems:   CKD (chronic kidney disease) stage 3, GFR 30-59 ml/min - stable    Anemia associated with acute blood loss - will need to transfuse 1 U PRBC- Hb 7.2     Orthostatic dizziness - symptoms resolving with hydration  Depression/ anxiety - cont Paxil    Code Status: full code Family Communication: daughter Tristin Vandeusen Disposition Plan: home when stable-  DVT prophylaxis: SCDs  Consultants: GI  Procedures: EGD  Antibiotics: Anti-infectives    None     Objective: Filed Weights   09/28/14 0512  Weight: 80.5 kg (177 lb 7.5 oz)    Intake/Output Summary (Last 24 hours) at 09/28/14 0952 Last data filed at 09/28/14 0900  Gross per 24 hour  Intake 1764.58 ml  Output   1350 ml  Net 414.58 ml     Vitals Filed Vitals:   09/27/14 2010 09/28/14 0000 09/28/14 0512 09/28/14 0800  BP: 128/61   129/57  Pulse: 82   68  Temp: 98.4 F (36.9 C) 97.9 F (36.6 C) 98 F (36.7 C) 98.4 F (36.9 C)  TempSrc: Oral  Oral Oral  Resp: 18   12  Height:   5\' 6"  (1.676 m)   Weight:   80.5 kg (177 lb 7.5 oz)   SpO2: 99%   92%    Exam: General: AAO x3, No acute respiratory distress Lungs: Clear to auscultation bilaterally without wheezes or crackles Cardiovascular: Regular rate and rhythm without murmur gallop or rub normal S1 and S2 Abdomen: Nontender, nondistended, soft, bowel sounds positive, no rebound, no ascites, no appreciable mass Extremities: No significant cyanosis,  clubbing, or edema bilateral lower extremities  Data Reviewed: Basic Metabolic Panel:  Recent Labs Lab 09/27/14 1316 09/28/14 0340  NA 140 136  K 4.1 4.1  CL 105 103  CO2 28 27  GLUCOSE 117* 110*  BUN 35* 28*  CREATININE 1.31 1.30  CALCIUM 9.2 8.3*   Liver Function Tests:  Recent Labs Lab 09/27/14 1316  AST 26  ALT 15  ALKPHOS 61  BILITOT 0.5  PROT 6.7  ALBUMIN 3.4*   No results for input(s): LIPASE, AMYLASE in the last 168 hours. No results for input(s): AMMONIA in the last 168 hours. CBC:  Recent Labs Lab 09/27/14 1316 09/27/14 1704 09/27/14 2100 09/28/14 0340 09/28/14 0905  WBC 10.4 9.4 9.2 6.7 6.1  NEUTROABS 6.9  --   --   --   --   HGB 8.9* 8.4* 8.2* 7.7* 7.2*  HCT 27.5* 26.2* 25.4* 23.9* 23.3*  MCV 90.8 91.0 90.7 90.9 91.7  PLT 217 228 216 184 189   Cardiac Enzymes: No results for input(s): CKTOTAL, CKMB, CKMBINDEX, TROPONINI in the last 168 hours. BNP (last 3 results) No results for input(s): BNP in the last 8760 hours.  ProBNP (last 3 results) No results for input(s): PROBNP in the last 8760 hours.  CBG: No results for input(s): GLUCAP in the last 168 hours.  Recent Results (from the past 240 hour(s))  MRSA  PCR Screening     Status: None   Collection Time: 09/27/14  5:04 PM  Result Value Ref Range Status   MRSA by PCR NEGATIVE NEGATIVE Final    Comment:        The GeneXpert MRSA Assay (FDA approved for NASAL specimens only), is one component of a comprehensive MRSA colonization surveillance program. It is not intended to diagnose MRSA infection nor to guide or monitor treatment for MRSA infections.      Studies:  Recent x-ray studies have been reviewed in detail by the Attending Physician  Scheduled Meds:  Scheduled Meds: . sodium chloride   Intravenous Once  . PARoxetine  20 mg Oral 6 X Daily   Continuous Infusions: . sodium chloride 125 mL/hr at 09/28/14 0243  . pantoprozole (PROTONIX) infusion 8 mg/hr (09/28/14 0900)     Time spent on care of this patient:35 min   Guy, MD 09/28/2014, 9:52 AM  LOS: 1 day   [facility] Office  [phone] Pager - Text Page per [email or web address]  If 7PM-7AM, please contact night-coverage [email or web address]

## 2014-09-29 ENCOUNTER — Encounter (HOSPITAL_COMMUNITY): Payer: Self-pay | Admitting: Gastroenterology

## 2014-09-29 ENCOUNTER — Encounter (HOSPITAL_COMMUNITY)
Admission: EM | Disposition: A | Discharge status: Home / Self Care | Payer: Self-pay | Source: Home / Self Care | Attending: Internal Medicine

## 2014-09-29 HISTORY — PX: COLONOSCOPY: SHX5424

## 2014-09-29 SURGERY — COLONOSCOPY
Anesthesia: Moderate Sedation

## 2014-09-29 MED ORDER — FENTANYL CITRATE 0.05 MG/ML IJ SOLN
INTRAMUSCULAR | Status: AC
  Filled 2014-09-29: qty 2

## 2014-09-29 MED ORDER — FERROUS SULFATE 325 (65 FE) MG PO TABS: 325.0000 mg | ORAL_TABLET | Freq: Two times a day (BID) | ORAL | Status: DC

## 2014-09-29 MED ORDER — DIPHENHYDRAMINE HCL 50 MG/ML IJ SOLN
INTRAMUSCULAR | Status: AC
  Filled 2014-09-29: qty 1

## 2014-09-29 MED ORDER — MIDAZOLAM HCL 5 MG/5ML IJ SOLN
INTRAMUSCULAR | Status: DC | PRN
  Administered 2014-09-29: 0.5 mg via INTRAVENOUS
  Administered 2014-09-29: 1 mg via INTRAVENOUS
  Administered 2014-09-29: 0.5 mg via INTRAVENOUS
  Administered 2014-09-29: 1 mg via INTRAVENOUS
  Administered 2014-09-29: 2 mg via INTRAVENOUS
  Administered 2014-09-29 (×2): 0.5 mg via INTRAVENOUS

## 2014-09-29 MED ORDER — FENTANYL CITRATE 0.05 MG/ML IJ SOLN
INTRAMUSCULAR | Status: DC | PRN
  Administered 2014-09-29 (×2): 12.5 ug via INTRAVENOUS
  Administered 2014-09-29: 25 ug via INTRAVENOUS
  Administered 2014-09-29 (×2): 12.5 ug via INTRAVENOUS

## 2014-09-29 MED ORDER — MIDAZOLAM HCL 10 MG/2ML IJ SOLN
INTRAMUSCULAR | Status: AC
  Filled 2014-09-29: qty 2

## 2014-09-29 NOTE — Interval H&P Note (Signed)
History and Physical Interval Note:  09/29/2014 2:23 PM  Steven Cain  has presented today for surgery, with the diagnosis of Melena and anemia  The various methods of treatment have been discussed with the patient and family. After consideration of risks, benefits and other options for treatment, the patient has consented to  Procedure(s): COLONOSCOPY (N/A) as a surgical intervention .  The patient's history has been reviewed, patient examined, no change in status, stable for surgery.  I have reviewed the patient's chart and labs.  Questions were answered to the patient's satisfaction.     [name] D

## 2014-09-29 NOTE — H&P (View-Only) (Signed)
[facility] Progress Note   IBN STIEF DJM:[id number] DOB: 10/06/1930 DOA: 09/27/2014 PCP: Precious Reel, MD  Brief narrative: Steven Cain is a 81 y.o. male presenting with Melena x 5 days, orthostatic symptoms and anemia.    Subjective: No further melena since being admitted to the hospital   Assessment/Plan: Principal Problem:   GI bleed -no abdominal pain- bleed seems to have abated- h/o gastrectomy for perforated gastric ulcer in 1974 - for EGD today - cont PPI  Active Problems:   CKD (chronic kidney disease) stage 3, GFR 30-59 ml/min - stable    Anemia associated with acute blood loss - will need to transfuse 1 U PRBC- Hb 7.2     Orthostatic dizziness - symptoms resolving with hydration  Depression/ anxiety - cont Paxil    Code Status: full code Family Communication: daughter Steven Cain Disposition Plan: home when stable-  DVT prophylaxis: SCDs  Consultants: GI  Procedures: EGD  Antibiotics: Anti-infectives    None     Objective: Filed Weights   09/28/14 0512  Weight: 80.5 kg (177 lb 7.5 oz)    Intake/Output Summary (Last 24 hours) at 09/28/14 0952 Last data filed at 09/28/14 0900  Gross per 24 hour  Intake 1764.58 ml  Output   1350 ml  Net 414.58 ml     Vitals Filed Vitals:   09/27/14 2010 09/28/14 0000 09/28/14 0512 09/28/14 0800  BP: 128/61   129/57  Pulse: 82   68  Temp: 98.4 F (36.9 C) 97.9 F (36.6 C) 98 F (36.7 C) 98.4 F (36.9 C)  TempSrc: Oral  Oral Oral  Resp: 18   12  Height:   5\' 6"  (1.676 m)   Weight:   80.5 kg (177 lb 7.5 oz)   SpO2: 99%   92%    Exam: General: AAO x3, No acute respiratory distress Lungs: Clear to auscultation bilaterally without wheezes or crackles Cardiovascular: Regular rate and rhythm without murmur gallop or rub normal S1 and S2 Abdomen: Nontender, nondistended, soft, bowel sounds positive, no rebound, no ascites, no appreciable mass Extremities: No significant cyanosis,  clubbing, or edema bilateral lower extremities  Data Reviewed: Basic Metabolic Panel:  Recent Labs Lab 09/27/14 1316 09/28/14 0340  NA 140 136  K 4.1 4.1  CL 105 103  CO2 28 27  GLUCOSE 117* 110*  BUN 35* 28*  CREATININE 1.31 1.30  CALCIUM 9.2 8.3*   Liver Function Tests:  Recent Labs Lab 09/27/14 1316  AST 26  ALT 15  ALKPHOS 61  BILITOT 0.5  PROT 6.7  ALBUMIN 3.4*   No results for input(s): LIPASE, AMYLASE in the last 168 hours. No results for input(s): AMMONIA in the last 168 hours. CBC:  Recent Labs Lab 09/27/14 1316 09/27/14 1704 09/27/14 2100 09/28/14 0340 09/28/14 0905  WBC 10.4 9.4 9.2 6.7 6.1  NEUTROABS 6.9  --   --   --   --   HGB 8.9* 8.4* 8.2* 7.7* 7.2*  HCT 27.5* 26.2* 25.4* 23.9* 23.3*  MCV 90.8 91.0 90.7 90.9 91.7  PLT 217 228 216 184 189   Cardiac Enzymes: No results for input(s): CKTOTAL, CKMB, CKMBINDEX, TROPONINI in the last 168 hours. BNP (last 3 results) No results for input(s): BNP in the last 8760 hours.  ProBNP (last 3 results) No results for input(s): PROBNP in the last 8760 hours.  CBG: No results for input(s): GLUCAP in the last 168 hours.  Recent Results (from the past 240 hour(s))  MRSA  PCR Screening     Status: None   Collection Time: 09/27/14  5:04 PM  Result Value Ref Range Status   MRSA by PCR NEGATIVE NEGATIVE Final    Comment:        The GeneXpert MRSA Assay (FDA approved for NASAL specimens only), is one component of a comprehensive MRSA colonization surveillance program. It is not intended to diagnose MRSA infection nor to guide or monitor treatment for MRSA infections.      Studies:  Recent x-ray studies have been reviewed in detail by the Attending Physician  Scheduled Meds:  Scheduled Meds: . sodium chloride   Intravenous Once  . PARoxetine  20 mg Oral 6 X Daily   Continuous Infusions: . sodium chloride 125 mL/hr at 09/28/14 0243  . pantoprozole (PROTONIX) infusion 8 mg/hr (09/28/14 0900)     Time spent on care of this patient:35 min   [place], MD 09/28/2014, 9:52 AM  LOS: 1 day   [facility] Office  [phone] Pager - Text Page per [email or web address]  If 7PM-7AM, please contact night-coverage [email or web address]

## 2014-09-29 NOTE — Care Management Note (Signed)
    Page 1 of 1   09/29/2014     4:17:54 PM CARE MANAGEMENT NOTE 09/29/2014  Patient:  Steven Cain, Steven Cain   Account Number:  [id number]  Date Initiated:  09/28/2014  Documentation initiated by:  DAVIS,RHONDA  Subjective/Objective Assessment:   pt with hx of gastectomy and now gi bleed, hypotensive and abd pain,     Action/Plan:   home when stable   Anticipated DC Date:  09/29/2014   Anticipated DC Plan:  HOME/SELF CARE  In-house referral  NA         Choice offered to / List presented to:             Status of service:  Completed, signed off Medicare Important Message given?   (If response is "NO", the following Medicare IM given date fields will be blank) Date Medicare IM given:   Medicare IM given by:   Date Additional Medicare IM given:   Additional Medicare IM given by:    Discharge Disposition:  HOME/SELF CARE  Per UR Regulation:  Reviewed for med. necessity/level of care/duration of stay  If discussed at [place] of Stay Meetings, dates discussed:    Comments:  09/29/14 Dessa Phi RN BSN NCM [phone] d/c home no needs or orders.  Feb. 15 2016/Rhonda L. Rosana Hoes, RN, BSN, CCM/Case Management [place] [phone] No discharge needs present of time of review.

## 2014-09-29 NOTE — Progress Notes (Signed)
Pt d/c as per MD order. PIV removed. Discharge instructions, follow up appts, reasons to return to ED/MD and after visit care reviewed with pt and daughter. Pt offered no questions and signed discharge paperwork with RN. Pt dressed in own clothes and escorted off of unit via wheelchair to daughter's car in main lobby.

## 2014-09-29 NOTE — Discharge Instructions (Signed)
Avoid NSAIDs 

## 2014-09-29 NOTE — Op Note (Signed)
[facility] [place], [id number]   COLONOSCOPY PROCEDURE REPORT  PATIENT: Steven Cain, Steven Cain  MR#: [id number] BIRTHDATE: 07-18-1931 , 83  yrs. old GENDER: male ENDOSCOPIST: Carol Ada, MD REFERRED BY: PROCEDURE DATE:  10-08-2014 PROCEDURE:   Colonoscopy with snare polypectomy ASA CLASS:   Class III INDICATIONS: Melena and anemia MEDICATIONS: Versed 6 mg IV and Fentanyl 75 mcg IV  DESCRIPTION OF PROCEDURE:   After the risks and benefits and of the procedure were explained, informed consent was obtained.  revealed no abnormalities of the rectum.    The EC-[id number] ([id number]) endoscope was introduced through the anus and advanced to the cecum, which was identified by both the appendix and ileocecal valve .  The quality of the prep was good. .  The instrument was then slowly withdrawn as the colon was fully examined.   FINDINGS: In the proximal transverse colon a semi-pedunculated polyp was identified and removed with a hot snare.  Grossly, this polyp gives the appearance of an inflammatory polyp and it may be the bleeding source when Xarelto was being used.  No evidence of any inflammation, ulcerations, erosions, masses, or vascular abnormalities.     Retroflexed views revealed no abnormalities. The scope was then withdrawn from the patient and the procedure completed.  WITHDRAWAL TIME: 23 minutes 0 seconds  COMPLICATIONS: There were no immediate complications. ENDOSCOPIC IMPRESSION: 1) Proximal transverse colon polyp.  Possible bleeding source when on Xarelto.  RECOMMENDATIONS: 1) Follow up biopsy results. 2) Follow up with Dr. Collene Mares in 2 weeks. 3) Signing off.  REPEAT EXAM:  cc:  _______________________________ eSignedCarol Ada, MD 10-08-14 3:22 PM   CPT CODES: ICD CODES:  The ICD and CPT codes recommended by this software are interpretations from the data that the clinical staff has captured with the software.  The  verification of the translation of this report to the ICD and CPT codes and modifiers is the sole responsibility of the health care institution and practicing physician where this report was generated.  [employer]. will not be held responsible for the validity of the ICD and CPT codes included on this report.  AMA assumes no liability for data contained or not contained herein. CPT is a Designer, television/film set of the [employer].

## 2014-09-29 NOTE — Discharge Summary (Addendum)
Physician Discharge Summary  Steven Cain IRS:[id number] DOB: May 28, 1931 DOA: 09/27/2014  PCP: Precious Reel, MD  Admit date: 09/27/2014 Discharge date: 09/29/2014  Time spent: 50 minutes  Recommendations for Outpatient Follow-up:  1. 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 He was started on Xarelto last weekend (Sunday) for right hand/ arm swelling with a suspicion he had a DVT. A venous duplex was negative on Monday and therefore Xarelto has been discontinued. He continued to take Ibuprofen and Aspirin for painand began having black stool on Wednesday (5 days ago) which have continued. His swelling in the right arm is gone but he is now lightheaded and feels weak along with the melena that he is having. He has a h/o GI bleed in 2004- no source was found at that time.   Hospital Course:  Principal Problem:  GI bleed with Melena x 5 days-- h/o gastrectomy and Billroth II for perforated gastric ulcer in 1974 - no abdominal pain - bleeding has abated and stools are now normal - EGD and Colonoscopy are negative for a source of bleed - he is asked to avoid NSAIDs   Active Problems:  CKD (chronic kidney disease) stage 3, GFR 30-59 ml/min - stable   Anemia associated with acute blood loss - it is noted that Hb was 13.2 in 10/15 and 8.9 when admitted on 2/14 -  transfused 1 U PRBC for a Hb 7.2  - Hb now steady at > 8 - will prescribe PO Iron on d/c - will need repeat Hb in 1 month to ensure it is improving   Orthostatic dizziness - due to GI bleed - symptoms  resolving with hydration  Depression/ anxiety - cont Paxil    Procedures:  EGD/ Colonoscopy  Consultations:  Dr Benson Norway - GI  Discharge Exam: Filed Weights   09/28/14 0512  Weight: 80.5 kg (177 lb 7.5 oz)   Filed Vitals:   09/29/14 1545  BP:   Pulse: 57  Temp:   Resp: 12    General: AAO x 3, no distress Cardiovascular: RRR, no murmurs  Respiratory: clear to auscultation bilaterally GI: soft, non-tender, non-distended, bowel sound positive  Discharge Instructions You were cared for by a hospitalist during your hospital stay. If you have any questions about your discharge medications or the care you received while you were in the hospital after you are discharged, you can call the unit and asked to speak with the hospitalist on call if the hospitalist that took care of you is not available. Once you are discharged, your primary care physician will handle any further medical issues. Please note that NO REFILLS for any discharge medications will be authorized once you are discharged, as it is imperative that you return to your primary care physician (or establish a relationship with a primary care physician if you do not have one) for your aftercare needs so that they can reassess your need for medications and monitor your lab values.      Discharge Instructions    Diet - low sodium heart healthy  Complete by:  As directed      Increase activity slowly    Complete by:  As directed             Medication List    STOP taking these medications        aspirin 325 MG tablet     ibuprofen 200 MG tablet  Commonly known as:  ADVIL,MOTRIN     naproxen sodium 220 MG tablet  Commonly known as:  ANAPROX     rivaroxaban 10 MG Tabs tablet  Commonly known as:  XARELTO      TAKE these medications        co-enzyme Q-10 30 MG capsule  Take 30 mg by mouth every morning.     ferrous sulfate 325 (65 FE) MG tablet  Take 1 tablet (325 mg total) by mouth 2 (two) times daily  with a meal.     fish oil-omega-3 fatty acids 1000 MG capsule  Take 2 g by mouth every morning.     meclizine 25 MG tablet  Commonly known as:  ANTIVERT  Take 25 mg by mouth 2 (two) times daily as needed for dizziness.     multivitamin with minerals Tabs tablet  Take 1 tablet by mouth every morning.     oxyCODONE-acetaminophen 5-325 MG per tablet  Commonly known as:  ROXICET  Take 1 tablet by mouth every 6 (six) hours as needed for moderate pain or severe pain. Post-operatively     PARoxetine 20 MG tablet  Commonly known as:  PAXIL  Take 20 mg by mouth 6 (six) times daily.     senna-docusate 8.6-50 MG per tablet  Commonly known as:  Senokot-S  Take 1 tablet by mouth 2 (two) times daily. While taking pain meds to prevent constipation     testosterone cypionate 200 MG/ML injection  Commonly known as:  DEPOTESTOTERONE CYPIONATE  Inject 100 mg into the muscle every 30 (thirty) days. 3/10 of cc.     VITAMIN B-12 IJ  Inject 1 mL as directed every 30 (thirty) days.     vitamin C 100 MG tablet  Take 100 mg by mouth every morning.     vitamin E 100 UNIT capsule  Take 100 Units by mouth every morning.       No Known Allergies Follow-up Information    Follow up with Beryle Beams, MD.   Specialty:  Gastroenterology   Why:  If symptoms worsen   Contact information:   [place], [address] [place] [address] [phone]        The results of significant diagnostics from this hospitalization (including imaging, microbiology, ancillary and laboratory) are listed below for reference.    Significant Diagnostic Studies: No results found.  Microbiology: Recent Results (from the past 240 hour(s))  MRSA PCR Screening     Status: None   Collection Time: 09/27/14  5:04 PM  Result Value Ref Range Status   MRSA by PCR NEGATIVE NEGATIVE Final    Comment:        The GeneXpert MRSA Assay (FDA approved for NASAL specimens only), is one component of a comprehensive  MRSA colonization surveillance program. It is not intended to diagnose MRSA infection nor to guide or monitor treatment for MRSA infections.      Labs: Basic Metabolic Panel:  Recent Labs Lab 09/27/14 1316 09/28/14 0340  NA 140 136  K 4.1 4.1  CL 105 103  CO2 28 27  GLUCOSE 117* 110*  BUN 35* 28*  CREATININE 1.31 1.30  CALCIUM 9.2 8.3*   Liver Function Tests:  Recent Labs Lab 09/27/14 1316  AST 26  ALT 15  ALKPHOS 61  BILITOT 0.5  PROT 6.7  ALBUMIN 3.4*   No results for input(s): LIPASE, AMYLASE in the last 168 hours. No results for input(s): AMMONIA in the last 168 hours. CBC:  Recent Labs Lab 09/27/14 1316  09/27/14 2100 09/28/14 0340 09/28/14 0905 09/28/14 1523 09/28/14 2123  WBC 10.4  < > 9.2 6.7 6.1 6.2 7.1  NEUTROABS 6.9  --   --   --   --   --   --   HGB 8.9*  < > 8.2* 7.7* 7.2* 8.4* 8.4*  HCT 27.5*  < > 25.4* 23.9* 23.3* 25.9* 25.2*  MCV 90.8  < > 90.7 90.9 91.7 90.6 90.3  PLT 217  < > 216 184 189 178 175  < > = values in this interval not displayed. Cardiac Enzymes: No results for input(s): CKTOTAL, CKMB, CKMBINDEX, TROPONINI in the last 168 hours. BNP: BNP (last 3 results) No results for input(s): BNP in the last 8760 hours.  ProBNP (last 3 results) No results for input(s): PROBNP in the last 8760 hours.  CBG: No results for input(s): GLUCAP in the last 168 hours.     SignedDebbe Odea, MD [facility] 09/29/2014, 3:55 PM

## 2014-09-30 ENCOUNTER — Encounter (HOSPITAL_COMMUNITY): Payer: Self-pay | Admitting: Gastroenterology

## 2014-09-30 LAB — TYPE AND SCREEN
ABO/RH(D): A POS
Antibody Screen: NEGATIVE
UNIT DIVISION: 0
Unit division: 0
Unit division: 0

## 2014-10-06 DIAGNOSIS — N189 Chronic kidney disease, unspecified: Secondary | ICD-10-CM | POA: Diagnosis not present

## 2014-10-06 DIAGNOSIS — I10 Essential (primary) hypertension: Secondary | ICD-10-CM | POA: Diagnosis not present

## 2014-10-06 DIAGNOSIS — D500 Iron deficiency anemia secondary to blood loss (chronic): Secondary | ICD-10-CM | POA: Diagnosis not present

## 2014-10-13 ENCOUNTER — Encounter (HOSPITAL_COMMUNITY): Payer: Self-pay | Admitting: *Deleted

## 2014-10-13 DIAGNOSIS — N289 Disorder of kidney and ureter, unspecified: Secondary | ICD-10-CM | POA: Diagnosis not present

## 2014-10-14 ENCOUNTER — Other Ambulatory Visit: Payer: Self-pay | Admitting: Urology

## 2014-10-14 NOTE — Progress Notes (Signed)
Called requested orders for Same day surgery 10-21-14 Thanks

## 2014-10-20 ENCOUNTER — Encounter (HOSPITAL_COMMUNITY): Payer: Self-pay | Admitting: Anesthesiology

## 2014-10-20 NOTE — Anesthesia Preprocedure Evaluation (Addendum)
Anesthesia Evaluation  Patient identified by MRN, date of birth, ID band Patient awake  General Assessment Comment:Patient denies family history of anesthesia complications  Reviewed: Allergy & Precautions, NPO status , Patient's Chart, lab work & pertinent test results  Airway Mallampati: II  TM Distance: >3 FB Neck ROM: Full    Dental  (+) Poor Dentition,    Pulmonary neg pulmonary ROS,  breath sounds clear to auscultation  Pulmonary exam normal       Cardiovascular hypertension, negative cardio ROS  Rhythm:Regular Rate:Normal     Neuro/Psych PSYCHIATRIC DISORDERS Depression negative neurological ROS     GI/Hepatic Neg liver ROS, PUD,   Endo/Other  negative endocrine ROS  Renal/GU Renal disease  negative genitourinary   Musculoskeletal negative musculoskeletal ROS (+)   Abdominal   Peds negative pediatric ROS (+)  Hematology  (+) anemia ,   Anesthesia Other Findings   Reproductive/Obstetrics negative OB ROS                           Anesthesia Physical Anesthesia Plan  ASA: III  Anesthesia Plan: General   Post-op Pain Management:    Induction: Intravenous  Airway Management Planned: LMA  Additional Equipment:   Intra-op Plan:   Post-operative Plan: Extubation in OR  Informed Consent: I have reviewed the patients History and Physical, chart, labs and discussed the procedure including the risks, benefits and alternatives for the proposed anesthesia with the patient or authorized representative who has indicated his/her understanding and acceptance.   Dental advisory given  Plan Discussed with: CRNA  Anesthesia Plan Comments:         Anesthesia Quick Evaluation

## 2014-10-21 ENCOUNTER — Ambulatory Visit (HOSPITAL_COMMUNITY)
Admission: RE | Admit: 2014-10-21 | Discharge: 2014-10-21 | Disposition: A | Discharge status: Home / Self Care | Payer: Medicare Other | Source: Ambulatory Visit | Attending: Urology | Admitting: Urology

## 2014-10-21 ENCOUNTER — Ambulatory Visit (HOSPITAL_COMMUNITY): Payer: Medicare Other | Admitting: Anesthesiology

## 2014-10-21 ENCOUNTER — Encounter (HOSPITAL_COMMUNITY): Payer: Self-pay | Admitting: *Deleted

## 2014-10-21 ENCOUNTER — Encounter (HOSPITAL_COMMUNITY)
Admission: RE | Disposition: A | Discharge status: Home / Self Care | Payer: Self-pay | Source: Ambulatory Visit | Attending: Urology

## 2014-10-21 DIAGNOSIS — Z9079 Acquired absence of other genital organ(s): Secondary | ICD-10-CM | POA: Diagnosis not present

## 2014-10-21 DIAGNOSIS — N2889 Other specified disorders of kidney and ureter: Secondary | ICD-10-CM | POA: Diagnosis not present

## 2014-10-21 DIAGNOSIS — D649 Anemia, unspecified: Secondary | ICD-10-CM | POA: Insufficient documentation

## 2014-10-21 DIAGNOSIS — N3090 Cystitis, unspecified without hematuria: Secondary | ICD-10-CM | POA: Diagnosis not present

## 2014-10-21 DIAGNOSIS — F17220 Nicotine dependence, chewing tobacco, uncomplicated: Secondary | ICD-10-CM | POA: Diagnosis not present

## 2014-10-21 DIAGNOSIS — I129 Hypertensive chronic kidney disease with stage 1 through stage 4 chronic kidney disease, or unspecified chronic kidney disease: Secondary | ICD-10-CM | POA: Insufficient documentation

## 2014-10-21 DIAGNOSIS — Z87440 Personal history of urinary (tract) infections: Secondary | ICD-10-CM | POA: Diagnosis not present

## 2014-10-21 DIAGNOSIS — N131 Hydronephrosis with ureteral stricture, not elsewhere classified: Secondary | ICD-10-CM | POA: Insufficient documentation

## 2014-10-21 DIAGNOSIS — F329 Major depressive disorder, single episode, unspecified: Secondary | ICD-10-CM | POA: Insufficient documentation

## 2014-10-21 DIAGNOSIS — Z8711 Personal history of peptic ulcer disease: Secondary | ICD-10-CM | POA: Diagnosis not present

## 2014-10-21 DIAGNOSIS — R32 Unspecified urinary incontinence: Secondary | ICD-10-CM | POA: Insufficient documentation

## 2014-10-21 DIAGNOSIS — N359 Urethral stricture, unspecified: Secondary | ICD-10-CM | POA: Diagnosis not present

## 2014-10-21 DIAGNOSIS — N183 Chronic kidney disease, stage 3 (moderate): Secondary | ICD-10-CM | POA: Insufficient documentation

## 2014-10-21 HISTORY — PX: CYSTOSCOPY WITH RETROGRADE PYELOGRAM, URETEROSCOPY AND STENT PLACEMENT: SHX5789

## 2014-10-21 LAB — CBC
HCT: 30.5 % — ABNORMAL LOW (ref 39.0–52.0)
Hemoglobin: 9.9 g/dL — ABNORMAL LOW (ref 13.0–17.0)
MCH: 28.2 pg (ref 26.0–34.0)
MCHC: 32.5 g/dL (ref 30.0–36.0)
MCV: 86.9 fL (ref 78.0–100.0)
Platelets: 293 10*3/uL (ref 150–400)
RBC: 3.51 MIL/uL — ABNORMAL LOW (ref 4.22–5.81)
RDW: 14.2 % (ref 11.5–15.5)
WBC: 6.6 10*3/uL (ref 4.0–10.5)

## 2014-10-21 SURGERY — CYSTOURETEROSCOPY, WITH RETROGRADE PYELOGRAM AND STENT INSERTION
Anesthesia: General | Laterality: Left

## 2014-10-21 MED ORDER — ACETAMINOPHEN 10 MG/ML IV SOLN
1000.0000 mg | Freq: Once | INTRAVENOUS | Status: DC
  Filled 2014-10-21: qty 100

## 2014-10-21 MED ORDER — ONDANSETRON HCL 4 MG/2ML IJ SOLN
INTRAMUSCULAR | Status: AC
  Filled 2014-10-21: qty 2

## 2014-10-21 MED ORDER — PROPOFOL 10 MG/ML IV BOLUS
INTRAVENOUS | Status: DC | PRN
  Administered 2014-10-21: 160 mg via INTRAVENOUS

## 2014-10-21 MED ORDER — SENNOSIDES-DOCUSATE SODIUM 8.6-50 MG PO TABS: 1.0000 | ORAL_TABLET | Freq: Two times a day (BID) | ORAL | Status: DC

## 2014-10-21 MED ORDER — GENTAMICIN IN SALINE 1.6-0.9 MG/ML-% IV SOLN
80.0000 mg | INTRAVENOUS | Status: AC
  Administered 2014-10-21: 80 mg via INTRAVENOUS
  Filled 2014-10-21: qty 50

## 2014-10-21 MED ORDER — PROPOFOL 10 MG/ML IV BOLUS
INTRAVENOUS | Status: AC
  Filled 2014-10-21: qty 20

## 2014-10-21 MED ORDER — LACTATED RINGERS IV SOLN
INTRAVENOUS | Status: DC | PRN
  Administered 2014-10-21 (×2): via INTRAVENOUS

## 2014-10-21 MED ORDER — FENTANYL CITRATE 0.05 MG/ML IJ SOLN
INTRAMUSCULAR | Status: DC | PRN
  Administered 2014-10-21: 50 ug via INTRAVENOUS

## 2014-10-21 MED ORDER — FENTANYL CITRATE 0.05 MG/ML IJ SOLN
INTRAMUSCULAR | Status: AC
Start: 2014-10-21 — End: 2014-10-21
  Filled 2014-10-21: qty 5

## 2014-10-21 MED ORDER — OXYCODONE-ACETAMINOPHEN 5-325 MG PO TABS: 1.0000 | ORAL_TABLET | Freq: Four times a day (QID) | ORAL | Status: DC | PRN

## 2014-10-21 MED ORDER — SODIUM CHLORIDE 0.9 % IR SOLN
Status: DC | PRN
  Administered 2014-10-21: 6000 mL

## 2014-10-21 MED ORDER — IOHEXOL 300 MG/ML  SOLN
INTRAMUSCULAR | Status: DC | PRN
  Administered 2014-10-21: 15 mL

## 2014-10-21 MED ORDER — LIDOCAINE HCL (CARDIAC) 10 MG/ML IV SOLN
INTRAVENOUS | Status: DC | PRN
  Administered 2014-10-21: 100 mg via INTRAVENOUS

## 2014-10-21 MED ORDER — LIDOCAINE HCL (CARDIAC) 20 MG/ML IV SOLN
INTRAVENOUS | Status: AC
  Filled 2014-10-21: qty 5

## 2014-10-21 MED ORDER — ACETAMINOPHEN 10 MG/ML IV SOLN
INTRAVENOUS | Status: DC | PRN
  Administered 2014-10-21: 1000 mg via INTRAVENOUS

## 2014-10-21 MED ORDER — ONDANSETRON HCL 4 MG/2ML IJ SOLN
INTRAMUSCULAR | Status: DC | PRN
  Administered 2014-10-21: 4 mg via INTRAVENOUS

## 2014-10-21 SURGICAL SUPPLY — 28 items
BAG URINE DRAINAGE (UROLOGICAL SUPPLIES) ×3 IMPLANT
BASKET LASER NITINOL 1.9FR (BASKET) IMPLANT
BASKET STNLS GEMINI 4WIRE 3FR (BASKET) IMPLANT
BASKET ZERO TIP NITINOL 2.4FR (BASKET) IMPLANT
BSKT STON RTRVL 120 1.9FR (BASKET)
BSKT STON RTRVL GEM 120X11 3FR (BASKET)
BSKT STON RTRVL ZERO TP 2.4FR (BASKET)
CATH INTERMIT  6FR 70CM (CATHETERS) ×3 IMPLANT
CLOTH BEACON ORANGE TIMEOUT ST (SAFETY) ×3 IMPLANT
ELECT REM PT RETURN 9FT ADLT (ELECTROSURGICAL)
ELECTRODE REM PT RTRN 9FT ADLT (ELECTROSURGICAL) IMPLANT
FIBER LASER FLEXIVA 1000 (UROLOGICAL SUPPLIES) IMPLANT
FIBER LASER FLEXIVA 200 (UROLOGICAL SUPPLIES) IMPLANT
FIBER LASER FLEXIVA 365 (UROLOGICAL SUPPLIES) IMPLANT
FIBER LASER FLEXIVA 550 (UROLOGICAL SUPPLIES) IMPLANT
FIBER LASER TRAC TIP (UROLOGICAL SUPPLIES) IMPLANT
FORCEPS BIOP 2.4F 115CM BACKLD (INSTRUMENTS) ×2 IMPLANT
GLOVE BIOGEL M STRL SZ7.5 (GLOVE) ×3 IMPLANT
GOWN STRL REUS W/TWL LRG LVL3 (GOWN DISPOSABLE) ×6 IMPLANT
GUIDEWIRE ANG ZIPWIRE 038X150 (WIRE) ×3 IMPLANT
GUIDEWIRE STR DUAL SENSOR (WIRE) ×3 IMPLANT
IV NS IRRIG 3000ML ARTHROMATIC (IV SOLUTION) ×3 IMPLANT
PACK CYSTO (CUSTOM PROCEDURE TRAY) ×3 IMPLANT
SHIELD EYE BINOCULAR (MISCELLANEOUS) IMPLANT
STENT CONTOUR 6FRX26X.038 (STENTS) ×2 IMPLANT
SYRINGE 10CC LL (SYRINGE) IMPLANT
SYRINGE IRR TOOMEY STRL 70CC (SYRINGE) IMPLANT
TUBE FEEDING 8FR 16IN STR KANG (MISCELLANEOUS) ×3 IMPLANT

## 2014-10-21 NOTE — Brief Op Note (Signed)
10/21/2014  9:25 AM  PATIENT:  Steven Cain  81 y.o. male  PRE-OPERATIVE DIAGNOSIS:  LEFT URETERAL STRICTURE  POST-OPERATIVE DIAGNOSIS:  LEFT URETERAL STRICTURE  PROCEDURE:  Procedure(s): CYSTOSCOPY WITH RETROGRADE PYELOGRAM, URETEROSCOPY,BIOPSY AND STENT CHANGE (Left)  SURGEON:  Surgeon(s) and Role:    * [name], MD - Primary  PHYSICIAN ASSISTANT:   ASSISTANTS: none   ANESTHESIA:   none, GENERAL LMA  EBL:  Total I/O In: 1000 [I.V.:1000] Out: -   BLOOD ADMINISTERED:none  DRAINS: none   LOCAL MEDICATIONS USED:  NONE  SPECIMEN:  Source of Specimen:  left ureteral stricture  DISPOSITION OF SPECIMEN:  fragments for permanant pathology  COUNTS:  YES  TOURNIQUET:  * No tourniquets in log *  DICTATION: .Other Dictation: Dictation Number [id number]  PLAN OF CARE: Discharge to home after PACU  PATIENT DISPOSITION:  PACU - hemodynamically stable.   Delay start of Pharmacological VTE agent (>24hrs) due to surgical blood loss or risk of bleeding: yes

## 2014-10-21 NOTE — Op Note (Signed)
NAME:  Steven Cain, Steven Cain NO.:  [id number]  MEDICAL RECORD NO.:  [id number]  [name]                         FACILITY:  [facility]  PHYSICIAN:  [name], MD     DATE OF BIRTH:  1930/10/08  DATE OF PROCEDURE:  10/21/2014 DATE OF DISCHARGE:                              OPERATIVE REPORT   DIAGNOSIS:  High-grade left ureteral stricture of the mid ureter.  PROCEDURE: 1. Cystoscopy with left retrograde pyelogram with interpretation. 2. Exchange of left ureteral stent, 6 x 26 Contour, no tether. 3. Left ureteroscopy with ureteroscopic biopsy.  FINDINGS: 1. Mild hydronephrosis down to narrowed area in the mid ureter,     roughly the area of the iliac crossing and slightly above this. 2. Small amount of erythematous and papillary tissue at the area of     narrowing concerning for possible neoplasia versus reactive changes     from stenting, this area appeared no worse than previous     ureteroscopic assessment months ago.  SPECIMEN:  Left ureteral stricture fragments for permanent Pathology.  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  He was found on evaluation of this to have a narrowing of the mid ureter consistent with significant stricture.  It was unclear whether this is neoplastic or not.  He underwent evaluation several months ago including diagnostic ureteroscopy with ureteroscopic biopsy and stenting and that did not definitively favor high-grade neoplastic process, as such he has been managed with interval stenting, which he has tolerated quite well.  He is now due for stent change.  It was felt that repeat ureteroscopic evaluation was also clearly warranted to help verify progression of possible neoplastic process and biopsy of this area, and he wished to proceed.  Informed consent was obtained and placed in medical record.  PROCEDURE IN DETAIL:  [name] verified. Procedure being left ureteroscopy with stent exchange and biopsy was confirmed.  Procedure was carried out.  Time-out was performed. Intravenous antibiotics were administered.  General LMA anesthesia was introduced.  The patient was placed into a low lithotomy position. Sterile field was created by prepping and draping the patient's penis, perineum, and proximal thighs using iodine x3.  Next, cystourethroscopy was performed using a 22-French rigid cystoscope with 12-degree offset lens.  Inspection of anterior and posterior urethra revealed significant distal hypospadias, this was stable.  Inspection of the posterior urethra revealed large defect in the area of the prostatic urethra consistent with known transurethral resection of the prostate. Inspection of the urinary bladder revealed no diverticula, calcifications, or papular lesions.  The distal left ureteral stent was seen in situ and in proper position.  This was grasped and brought out in its entirety and the left ureter was cannulated with a 6-French end- hole catheter and left retrograde pyelogram was obtained.  Left retrograde pyelogram demonstrated a single left ureter, single system left kidney.  There was moderate hydroureteronephrosis to the level of the mid ureter consistent with known stricture.  A 0.038 zip wire was advanced at the level of the upper pole and set aside as a safety wire.  Next, semi-rigid  ureteroscopy was performed of the distal left ureter along alongside a separate Sensor working wire and an 8- [place] feeding tube in urinary bladder for pressure release.  This revealed somewhat stable-appearing area of narrowing at and just above the area of the iliac crossing.  There were some mild papillary changes of this area consistent with possible reaction from the stent versus neoplasia, thus the goal was also to rebiopsy this area, the BIGopsy ureteral biopsy apparatus was back loaded over the  semi-rigid ureteroscope and under ureteroscopic vision this area of narrowing was biopsied x3 with a small tissue fragment set aside for permanent pathology.  Repeat ureteroscopic examination revealed excellent hemostasis.  There was no evidence of perforation.  As such, a new 6 x 26 Contour type stent was placed through remaining safety wire using cystoscopic and fluoroscopic guidance.  Good proximal and distal deployment were noted.  Efflux of urine was seen around and to the distal end of the stent.  Procedure was then terminated.  The patient tolerated the procedure well.  There were no immediate periprocedural complications.  The patient was taken to the postanesthesia care unit in stable condition.          ______________________________ [name], MD     TM/MEDQ  D:  10/21/2014  T:  10/21/2014  Job:  [id number]

## 2014-10-21 NOTE — Anesthesia Postprocedure Evaluation (Signed)
  Anesthesia Post-op Note  Patient: Steven Cain  Procedure(s) Performed: Procedure(s) (LRB): CYSTOSCOPY WITH RETROGRADE PYELOGRAM, URETEROSCOPY,BIOPSY AND STENT CHANGE (Left)  Patient Location: PACU  Anesthesia Type: General  Level of Consciousness: awake and alert   Airway and Oxygen Therapy: Patient Spontanous Breathing  Post-op Pain: mild  Post-op Assessment: Post-op Vital signs reviewed, Patient's Cardiovascular Status Stable, Respiratory Function Stable, Patent Airway and No signs of Nausea or vomiting  Last Vitals:  Filed Vitals:   10/21/14 1136  BP: 156/69  Pulse: 74  Temp: 36.7 C  Resp: 16    Post-op Vital Signs: stable   Complications: No apparent anesthesia complications

## 2014-10-21 NOTE — Discharge Instructions (Signed)
1 - You may have urinary urgency (bladder spasms) and bloody urine on / off with stent in place. This is normal.  2 - Call MD or go to ER for fever >102, severe pain / nausea / vomiting not relieved by medications, or acute change in medical status      General Anesthesia, Care After Refer to this sheet in the next few weeks. These instructions provide you with information on caring for yourself after your procedure. Your health care provider may also give you more specific instructions. Your treatment has been planned according to current medical practices, but problems sometimes occur. Call your health care provider if you have any problems or questions after your procedure. WHAT TO EXPECT AFTER THE PROCEDURE After the procedure, it is typical to experience:  Sleepiness.  Nausea and vomiting. HOME CARE INSTRUCTIONS  For the first 24 hours after general anesthesia:  Have a responsible person with you.  Do not drive a car. If you are alone, do not take public transportation.  Do not drink alcohol.  Do not take medicine that has not been prescribed by your health care provider.  Do not sign important papers or make important decisions.  You may resume a normal diet and activities as directed by your health care provider.  Change bandages (dressings) as directed.  If you have questions or problems that seem related to general anesthesia, call the hospital and ask for the anesthetist or anesthesiologist on call. SEEK MEDICAL CARE IF:  You have nausea and vomiting that continue the day after anesthesia.  You develop a rash. SEEK IMMEDIATE MEDICAL CARE IF:   You have difficulty breathing.  You have chest pain.  You have any allergic problems. Document Released: 11/06/2000 Document Revised: 08/05/2013 Document Reviewed: 02/13/2013 [facility] Patient Information 2015 [place], [place]. This information is not intended to replace advice given to you by your health care provider.  Make sure you discuss any questions you have with your health care provider.

## 2014-10-21 NOTE — Transfer of Care (Signed)
Immediate Anesthesia Transfer of Care Note  Patient: Steven Cain  Procedure(s) Performed: Procedure(s): CYSTOSCOPY WITH RETROGRADE PYELOGRAM, URETEROSCOPY,BIOPSY AND STENT CHANGE (Left)  Patient Location: PACU  Anesthesia Type:General  Level of Consciousness: awake, alert , oriented and patient cooperative  Airway & Oxygen Therapy: Patient Spontanous Breathing and Patient connected to face mask oxygen  Post-op Assessment: Report given to RN, Post -op Vital signs reviewed and stable and Patient moving all extremities X 4  Post vital signs: stable  Last Vitals:  Filed Vitals:   10/21/14 0936  BP: 161/64  Pulse: 62  Temp:   Resp: 16    Complications: No apparent anesthesia complications

## 2014-10-21 NOTE — H&P (Signed)
Steven Cain is an 81 y.o. male.    Chief Complaint:  PRe-OP Left Ureteroscopy with Biopsy and Stent Change  HPI:   1 - Obstructing Left Ureteral Stricture - worrisome soft tissue thickening of distal left ureter by CT and operative retrograde pyelogram 05/06/14. Now s/p stenting. No bladder lesion or additional lesions by imaging. He has h/o extensive smokeless tobacco exposure.   Recent Course: 05/2014 - Left Ureteroscopy / biopsy / brushing / washing / stent change (6x26)- path just mild atypia only, no frank carcinoma,   2 - Incontinence - Long h/o near total incontinence s/p TURP previously by Dr. Rosana Hoes. Manges with adult diapers x years.  PMH sig for Billroth II procedure R+Y, bilateral inguinal hernia repair with mesh, alcoholism (now very involved and compliant with AA)  His PCP is Dr. Virgina Jock.  Today [name] is seen to proceed with left ureteral stent change and repeat left ureteroscopy / biopsy for his left ureteral stricture. He has been on proph Bactrim pre-op to reduce bacterial load from his chronic urinary colonization. NO fevers.    Past Medical History  Diagnosis Date  . Depression   . Hypertension   . Perforated, severe stomach ulcer   . GI bleed   . Family history of adverse reaction to anesthesia     Past Surgical History  Procedure Laterality Date  . Abdominal surgery    . Transurethral resection of prostate    . Cystoscopy w/ ureteral stent placement Left 05/06/2014    Procedure: CYSTOSCOPY WITH RETROGRADE PYELOGRAM/URETERAL STENT PLACEMENT;  Surgeon: [name], MD;  Location: [facility];  Service: Urology;  Laterality: Left;  . Cystoscopy/retrograde/ureteroscopy Left 06/03/2014    Procedure: CYSTOSCOPY/RETROGRADE/URETEROSCOPY WITH BIOPSY,STENT EXCHANGE;  Surgeon: [name], MD;  Location: [facility];  Service: Urology;  Laterality: Left;  . Gastric roux-en-y    . Esophagogastroduodenoscopy N/A 09/28/2014    Procedure: ESOPHAGOGASTRODUODENOSCOPY (EGD);   Surgeon: Beryle Beams, MD;  Location: [facility];  Service: Endoscopy;  Laterality: N/A;  . Colonoscopy N/A 09/29/2014    Procedure: COLONOSCOPY;  Surgeon: Beryle Beams, MD;  Location: [facility];  Service: Endoscopy;  Laterality: N/A;    History reviewed. No pertinent family history. Social History:  reports that he has never smoked. His smokeless tobacco use includes Chew. He reports that he does not drink alcohol or use illicit drugs.  Allergies: No Known Allergies  Medications Prior to Admission  Medication Sig Dispense Refill  . Ascorbic Acid (VITAMIN C) 100 MG tablet Take 100 mg by mouth every morning.     Marland Kitchen co-enzyme Q-10 30 MG capsule Take 30 mg by mouth every morning.     . Cyanocobalamin (VITAMIN B-12 IJ) Inject 1 mL as directed every 30 (thirty) days.    . ferrous sulfate 325 (65 FE) MG tablet Take 1 tablet (325 mg total) by mouth 2 (two) times daily with a meal. 90 tablet 0  . fish oil-omega-3 fatty acids 1000 MG capsule Take 2 g by mouth every morning.     . meclizine (ANTIVERT) 25 MG tablet Take 25 mg by mouth 2 (two) times daily as needed for dizziness.    . Multiple Vitamin (MULTIVITAMIN WITH MINERALS) TABS tablet Take 1 tablet by mouth every morning.    Marland Kitchen oxyCODONE-acetaminophen (ROXICET) 5-325 MG per tablet Take 1 tablet by mouth every 6 (six) hours as needed for moderate pain or severe pain. Post-operatively (Patient not taking: Reported on 09/27/2014) 30 tablet 0  . PARoxetine (PAXIL) 20  MG tablet Take 20 mg by mouth 6 (six) times daily.     Marland Kitchen senna-docusate (SENOKOT-S) 8.6-50 MG per tablet Take 1 tablet by mouth 2 (two) times daily. While taking pain meds to prevent constipation (Patient not taking: Reported on 09/27/2014) 30 tablet 0  . testosterone cypionate (DEPOTESTOTERONE CYPIONATE) 200 MG/ML injection Inject 100 mg into the muscle every 30 (thirty) days. 3/10 of cc.    . vitamin E 100 UNIT capsule Take 100 Units by mouth every morning.       No results found  for this or any previous visit (from the past 48 hour(s)). No results found.  Review of Systems  Constitutional: Negative.  Negative for fever and chills.  HENT: Negative.   Eyes: Negative.   Respiratory: Negative.   Cardiovascular: Negative.   Gastrointestinal: Negative.   Genitourinary: Negative for dysuria.  Musculoskeletal: Negative.   Skin: Negative.   Neurological: Negative.   Endo/Heme/Allergies: Negative.   Psychiatric/Behavioral: Negative.     There were no vitals taken for this visit. Physical Exam  Constitutional: He appears well-developed.  HENT:  Head: Normocephalic.  Eyes: Pupils are equal, round, and reactive to light.  Neck: Normal range of motion.  Cardiovascular: Normal rate.   Respiratory: Effort normal.  GI: Soft.  Genitourinary:  No CVAT. IN adult diaper with total incontinence as per baseline  Musculoskeletal: Normal range of motion.  Neurological: He is alert.  Skin: Skin is warm.  Psychiatric: He has a normal mood and affect. His behavior is normal. Judgment and thought content normal.     Assessment/Plan       1 - Obstructing Left Ureteral Stricture - most recent path results favor benign stricture, but we still remain somewhat concerned about possibility of malignacy. I feel most reasonable plan in short term would be repeat ureteroscopy / biopsy / stent change as planned today to verify no interval change with plan for eventual goal of twice yearly stent changes to managed sricture as he is very poor operative candidate for definitive management with h/o extensive piror surgery and significant comorbidity. He has very good understanding of the competing issues and wants to proceed as such.  Risks, benefits, alternative reiterated again today and pt voiced understanding.   2 - Incontinence - continue current regimen.   Deangelo Berns 10/21/2014, 6:05 AM

## 2014-10-22 ENCOUNTER — Encounter (HOSPITAL_COMMUNITY): Payer: Self-pay | Admitting: Urology

## 2014-10-28 DIAGNOSIS — D500 Iron deficiency anemia secondary to blood loss (chronic): Secondary | ICD-10-CM | POA: Diagnosis not present

## 2014-10-28 DIAGNOSIS — Z6828 Body mass index (BMI) 28.0-28.9, adult: Secondary | ICD-10-CM | POA: Diagnosis not present

## 2014-10-28 DIAGNOSIS — I10 Essential (primary) hypertension: Secondary | ICD-10-CM | POA: Diagnosis not present

## 2014-10-28 DIAGNOSIS — M7989 Other specified soft tissue disorders: Secondary | ICD-10-CM | POA: Diagnosis not present

## 2014-10-28 DIAGNOSIS — N189 Chronic kidney disease, unspecified: Secondary | ICD-10-CM | POA: Diagnosis not present

## 2014-12-15 DIAGNOSIS — N289 Disorder of kidney and ureter, unspecified: Secondary | ICD-10-CM | POA: Diagnosis not present

## 2014-12-15 DIAGNOSIS — N135 Crossing vessel and stricture of ureter without hydronephrosis: Secondary | ICD-10-CM | POA: Diagnosis not present

## 2014-12-15 DIAGNOSIS — N3941 Urge incontinence: Secondary | ICD-10-CM | POA: Diagnosis not present

## 2015-01-19 ENCOUNTER — Other Ambulatory Visit: Payer: Self-pay | Admitting: Urology

## 2015-02-08 ENCOUNTER — Other Ambulatory Visit: Payer: Self-pay

## 2015-03-15 DIAGNOSIS — D2312 Other benign neoplasm of skin of left eyelid, including canthus: Secondary | ICD-10-CM | POA: Diagnosis not present

## 2015-03-16 DIAGNOSIS — D500 Iron deficiency anemia secondary to blood loss (chronic): Secondary | ICD-10-CM | POA: Diagnosis not present

## 2015-03-16 DIAGNOSIS — I878 Other specified disorders of veins: Secondary | ICD-10-CM | POA: Diagnosis not present

## 2015-03-16 DIAGNOSIS — R600 Localized edema: Secondary | ICD-10-CM | POA: Diagnosis not present

## 2015-03-16 DIAGNOSIS — E538 Deficiency of other specified B group vitamins: Secondary | ICD-10-CM | POA: Diagnosis not present

## 2015-03-16 DIAGNOSIS — E291 Testicular hypofunction: Secondary | ICD-10-CM | POA: Diagnosis not present

## 2015-03-16 DIAGNOSIS — I10 Essential (primary) hypertension: Secondary | ICD-10-CM | POA: Diagnosis not present

## 2015-03-16 DIAGNOSIS — N289 Disorder of kidney and ureter, unspecified: Secondary | ICD-10-CM | POA: Diagnosis not present

## 2015-03-16 DIAGNOSIS — Z6830 Body mass index (BMI) 30.0-30.9, adult: Secondary | ICD-10-CM | POA: Diagnosis not present

## 2015-03-16 DIAGNOSIS — N189 Chronic kidney disease, unspecified: Secondary | ICD-10-CM | POA: Diagnosis not present

## 2015-03-25 NOTE — Patient Instructions (Signed)
Steven Cain  03/25/2015   Your procedure is scheduled on:   03/31/15   Report to [facility]  Entrance take [facility]  elevators to 3rd floor to  [place] at      1030 AM.  Call this number if you have problems the morning of surgery [phone]   Remember: ONLY 1 PERSON MAY GO WITH YOU TO SHORT STAY TO GET  READY MORNING OF [place].  Do not eat food or drink liquids :After Midnight.     Take these medicines the morning of surgery with A SIP OF WATER: none                                You may not have any metal on your body including hair pins and              piercings  Do not wear jewelry, , lotions, powders or perfumes, deodorant                        Men may shave face and neck.   Do not bring valuables to the hospital. [place].  Contacts, dentures or bridgework may not be worn into surgery.       Patients discharged the day of surgery will not be allowed to drive home.  Name and phone number of your driver:  Special Instructions: coughing and deep breathing exercises, leg exercises               Please read over the following fact sheets you were given: _____________________________________________________________________             [facility] - Preparing for Surgery Before surgery, you can play an important role.  Because skin is not sterile, your skin needs to be as free of germs as possible.  You can reduce the number of germs on your skin by washing with CHG (chlorahexidine gluconate) soap before surgery.  CHG is an antiseptic cleaner which kills germs and bonds with the skin to continue killing germs even after washing. Please DO NOT use if you have an allergy to CHG or antibacterial soaps.  If your skin becomes reddened/irritated stop using the CHG and inform your nurse when you arrive at Short Stay. Do not shave (including legs and underarms) for at least 48 hours prior to  the first CHG shower.  You may shave your face/neck. Please follow these instructions carefully:  1.  Shower with CHG Soap the night before surgery and the  morning of Surgery.  2.  If you choose to wash your hair, wash your hair first as usual with your  normal  shampoo.  3.  After you shampoo, rinse your hair and body thoroughly to remove the  shampoo.                           4.  Use CHG as you would any other liquid soap.  You can apply chg directly  to the skin and wash                       Gently with a scrungie or clean  washcloth.  5.  Apply the CHG Soap to your body ONLY FROM THE NECK DOWN.   Do not use on face/ open                           Wound or open sores. Avoid contact with eyes, ears mouth and genitals (private parts).                       Wash face,  Genitals (private parts) with your normal soap.             6.  Wash thoroughly, paying special attention to the area where your surgery  will be performed.  7.  Thoroughly rinse your body with warm water from the neck down.  8.  DO NOT shower/wash with your normal soap after using and rinsing off  the CHG Soap.                9.  Pat yourself dry with a clean towel.            10.  Wear clean pajamas.            11.  Place clean sheets on your bed the night of your first shower and do not  sleep with pets. Day of Surgery : Do not apply any lotions/deodorants the morning of surgery.  Please wear clean clothes to the hospital/surgery center.  FAILURE TO FOLLOW THESE INSTRUCTIONS MAY RESULT IN THE CANCELLATION OF YOUR SURGERY PATIENT SIGNATURE_________________________________  NURSE SIGNATURE__________________________________  ________________________________________________________________________

## 2015-03-29 ENCOUNTER — Encounter (HOSPITAL_COMMUNITY): Payer: Self-pay

## 2015-03-29 ENCOUNTER — Encounter (HOSPITAL_COMMUNITY)
Admission: RE | Admit: 2015-03-29 | Discharge: 2015-03-29 | Disposition: A | Discharge status: Home / Self Care | Payer: Medicare Other | Source: Ambulatory Visit | Attending: Urology | Admitting: Urology

## 2015-03-29 DIAGNOSIS — N135 Crossing vessel and stricture of ureter without hydronephrosis: Secondary | ICD-10-CM | POA: Diagnosis present

## 2015-03-29 DIAGNOSIS — F17220 Nicotine dependence, chewing tobacco, uncomplicated: Secondary | ICD-10-CM | POA: Diagnosis not present

## 2015-03-29 DIAGNOSIS — Z6830 Body mass index (BMI) 30.0-30.9, adult: Secondary | ICD-10-CM | POA: Diagnosis not present

## 2015-03-29 DIAGNOSIS — Q549 Hypospadias, unspecified: Secondary | ICD-10-CM | POA: Diagnosis not present

## 2015-03-29 DIAGNOSIS — E669 Obesity, unspecified: Secondary | ICD-10-CM | POA: Diagnosis not present

## 2015-03-29 DIAGNOSIS — I10 Essential (primary) hypertension: Secondary | ICD-10-CM | POA: Diagnosis not present

## 2015-03-29 DIAGNOSIS — N131 Hydronephrosis with ureteral stricture, not elsewhere classified: Secondary | ICD-10-CM | POA: Diagnosis not present

## 2015-03-29 DIAGNOSIS — F329 Major depressive disorder, single episode, unspecified: Secondary | ICD-10-CM | POA: Diagnosis not present

## 2015-03-29 DIAGNOSIS — R32 Unspecified urinary incontinence: Secondary | ICD-10-CM | POA: Diagnosis not present

## 2015-03-29 DIAGNOSIS — Z8711 Personal history of peptic ulcer disease: Secondary | ICD-10-CM | POA: Diagnosis not present

## 2015-03-29 LAB — BASIC METABOLIC PANEL
ANION GAP: 7 (ref 5–15)
BUN: 18 mg/dL (ref 6–20)
CALCIUM: 9.2 mg/dL (ref 8.9–10.3)
CHLORIDE: 107 mmol/L (ref 101–111)
CO2: 26 mmol/L (ref 22–32)
CREATININE: 1.33 mg/dL — AB (ref 0.61–1.24)
GFR calc non Af Amer: 48 mL/min — ABNORMAL LOW (ref >60)
GFR, EST AFRICAN AMERICAN: 56 mL/min — AB (ref >60)
Glucose, Bld: 106 mg/dL — ABNORMAL HIGH (ref 65–99)
Potassium: 4 mmol/L (ref 3.5–5.1)
SODIUM: 140 mmol/L (ref 135–145)

## 2015-03-29 LAB — CBC
HCT: 40.9 % (ref 39.0–52.0)
HEMOGLOBIN: 13 g/dL (ref 13.0–17.0)
MCH: 27.8 pg (ref 26.0–34.0)
MCHC: 31.8 g/dL (ref 30.0–36.0)
MCV: 87.4 fL (ref 78.0–100.0)
Platelets: 200 10*3/uL (ref 150–400)
RBC: 4.68 MIL/uL (ref 4.22–5.81)
RDW: 15.6 % — AB (ref 11.5–15.5)
WBC: 7.7 10*3/uL (ref 4.0–10.5)

## 2015-03-29 NOTE — Progress Notes (Addendum)
EKG and CXR  05/2014 in [facility]

## 2015-03-30 MED ORDER — GENTAMICIN SULFATE 40 MG/ML IJ SOLN
5.0000 mg/kg | INTRAVENOUS | Status: AC
  Administered 2015-03-31: 360 mg via INTRAVENOUS
  Filled 2015-03-30: qty 9

## 2015-03-31 ENCOUNTER — Encounter (HOSPITAL_COMMUNITY): Payer: Self-pay | Admitting: *Deleted

## 2015-03-31 ENCOUNTER — Ambulatory Visit (HOSPITAL_COMMUNITY)
Admission: RE | Admit: 2015-03-31 | Discharge: 2015-03-31 | Disposition: A | Discharge status: Home / Self Care | Payer: Medicare Other | Source: Ambulatory Visit | Attending: Urology | Admitting: Urology

## 2015-03-31 ENCOUNTER — Ambulatory Visit (HOSPITAL_COMMUNITY): Payer: Medicare Other | Admitting: Certified Registered"

## 2015-03-31 ENCOUNTER — Encounter (HOSPITAL_COMMUNITY)
Admission: RE | Disposition: A | Discharge status: Home / Self Care | Payer: Self-pay | Source: Ambulatory Visit | Attending: Urology

## 2015-03-31 DIAGNOSIS — F329 Major depressive disorder, single episode, unspecified: Secondary | ICD-10-CM | POA: Insufficient documentation

## 2015-03-31 DIAGNOSIS — F17220 Nicotine dependence, chewing tobacco, uncomplicated: Secondary | ICD-10-CM | POA: Insufficient documentation

## 2015-03-31 DIAGNOSIS — N135 Crossing vessel and stricture of ureter without hydronephrosis: Secondary | ICD-10-CM | POA: Diagnosis not present

## 2015-03-31 DIAGNOSIS — Z6830 Body mass index (BMI) 30.0-30.9, adult: Secondary | ICD-10-CM | POA: Insufficient documentation

## 2015-03-31 DIAGNOSIS — I10 Essential (primary) hypertension: Secondary | ICD-10-CM | POA: Insufficient documentation

## 2015-03-31 DIAGNOSIS — Z8711 Personal history of peptic ulcer disease: Secondary | ICD-10-CM | POA: Insufficient documentation

## 2015-03-31 DIAGNOSIS — E669 Obesity, unspecified: Secondary | ICD-10-CM | POA: Insufficient documentation

## 2015-03-31 DIAGNOSIS — N131 Hydronephrosis with ureteral stricture, not elsewhere classified: Secondary | ICD-10-CM | POA: Insufficient documentation

## 2015-03-31 DIAGNOSIS — N359 Urethral stricture, unspecified: Secondary | ICD-10-CM | POA: Diagnosis not present

## 2015-03-31 DIAGNOSIS — R32 Unspecified urinary incontinence: Secondary | ICD-10-CM | POA: Insufficient documentation

## 2015-03-31 DIAGNOSIS — Q549 Hypospadias, unspecified: Secondary | ICD-10-CM | POA: Insufficient documentation

## 2015-03-31 HISTORY — PX: CYSTOSCOPY W/ URETERAL STENT PLACEMENT: SHX1429

## 2015-03-31 SURGERY — CYSTOSCOPY, WITH RETROGRADE PYELOGRAM AND URETERAL STENT INSERTION
Anesthesia: General | Laterality: Left

## 2015-03-31 MED ORDER — IOHEXOL 300 MG/ML  SOLN
INTRAMUSCULAR | Status: DC | PRN
  Administered 2015-03-31: 5 mL via URETHRAL

## 2015-03-31 MED ORDER — ONDANSETRON HCL 4 MG/2ML IJ SOLN
INTRAMUSCULAR | Status: AC
  Filled 2015-03-31: qty 2

## 2015-03-31 MED ORDER — FENTANYL CITRATE (PF) 100 MCG/2ML IJ SOLN: 25.0000 ug | INTRAMUSCULAR | Status: DC | PRN

## 2015-03-31 MED ORDER — FENTANYL CITRATE (PF) 100 MCG/2ML IJ SOLN
INTRAMUSCULAR | Status: DC | PRN
Start: 2015-03-31 — End: 2015-03-31
  Administered 2015-03-31 (×2): 50 ug via INTRAVENOUS

## 2015-03-31 MED ORDER — LACTATED RINGERS IV SOLN
INTRAVENOUS | Status: DC | PRN
  Administered 2015-03-31: 12:00:00 via INTRAVENOUS

## 2015-03-31 MED ORDER — SODIUM CHLORIDE 0.9 % IR SOLN
Status: DC | PRN
  Administered 2015-03-31: 3000 mL

## 2015-03-31 MED ORDER — PROPOFOL 10 MG/ML IV BOLUS
INTRAVENOUS | Status: AC
  Filled 2015-03-31: qty 20

## 2015-03-31 MED ORDER — ONDANSETRON HCL 4 MG/2ML IJ SOLN
INTRAMUSCULAR | Status: DC | PRN
  Administered 2015-03-31: 4 mg via INTRAVENOUS

## 2015-03-31 MED ORDER — LACTATED RINGERS IV SOLN: INTRAVENOUS | Status: DC

## 2015-03-31 MED ORDER — PROMETHAZINE HCL 25 MG/ML IJ SOLN: 6.2500 mg | INTRAMUSCULAR | Status: DC | PRN

## 2015-03-31 MED ORDER — PROPOFOL 10 MG/ML IV BOLUS
INTRAVENOUS | Status: DC | PRN
  Administered 2015-03-31: 100 mg via INTRAVENOUS

## 2015-03-31 MED ORDER — FENTANYL CITRATE (PF) 100 MCG/2ML IJ SOLN
INTRAMUSCULAR | Status: AC
  Filled 2015-03-31: qty 4

## 2015-03-31 MED ORDER — LIDOCAINE HCL (CARDIAC) 20 MG/ML IV SOLN
INTRAVENOUS | Status: DC | PRN
  Administered 2015-03-31: 20 mg via INTRAVENOUS

## 2015-03-31 SURGICAL SUPPLY — 25 items
BASKET LASER NITINOL 1.9FR (BASKET) IMPLANT
BASKET STNLS GEMINI 4WIRE 3FR (BASKET) IMPLANT
BASKET ZERO TIP NITINOL 2.4FR (BASKET) IMPLANT
BSKT STON RTRVL 120 1.9FR (BASKET)
BSKT STON RTRVL GEM 120X11 3FR (BASKET)
BSKT STON RTRVL ZERO TP 2.4FR (BASKET)
CATH INTERMIT  6FR 70CM (CATHETERS) ×3 IMPLANT
CLOTH BEACON ORANGE TIMEOUT ST (SAFETY) ×1 IMPLANT
ELECT REM PT RETURN 9FT ADLT (ELECTROSURGICAL)
ELECTRODE REM PT RTRN 9FT ADLT (ELECTROSURGICAL) IMPLANT
FIBER LASER FLEXIVA 1000 (UROLOGICAL SUPPLIES) IMPLANT
FIBER LASER FLEXIVA 200 (UROLOGICAL SUPPLIES) IMPLANT
FIBER LASER FLEXIVA 365 (UROLOGICAL SUPPLIES) IMPLANT
FIBER LASER FLEXIVA 550 (UROLOGICAL SUPPLIES) IMPLANT
FIBER LASER TRAC TIP (UROLOGICAL SUPPLIES) IMPLANT
GLOVE BIOGEL M STRL SZ7.5 (GLOVE) ×7 IMPLANT
GOWN STRL REUS W/TWL XL LVL3 (GOWN DISPOSABLE) ×6 IMPLANT
GUIDEWIRE ANG ZIPWIRE 038X150 (WIRE) ×3 IMPLANT
GUIDEWIRE STR DUAL SENSOR (WIRE) IMPLANT
IV NS IRRIG 3000ML ARTHROMATIC (IV SOLUTION) IMPLANT
PACK CYSTO (CUSTOM PROCEDURE TRAY) ×3 IMPLANT
STENT POLARIS 7FRX26X.038 (STENTS) ×2 IMPLANT
SYRINGE 10CC LL (SYRINGE) IMPLANT
SYRINGE IRR TOOMEY STRL 70CC (SYRINGE) IMPLANT
TUBE FEEDING 8FR 16IN STR KANG (MISCELLANEOUS) ×3 IMPLANT

## 2015-03-31 NOTE — Discharge Instructions (Signed)
1 - You may have urinary urgency (bladder spasms) and bloody urine on / off with stent in place. This is normal.  2 - Call MD or go to ER for fever >102, severe pain / nausea / vomiting not relieved by medications, or acute change in medical status General Anesthesia, Care After Refer to this sheet in the next few weeks. These instructions provide you with information on caring for yourself after your procedure. Your health care provider may also give you more specific instructions. Your treatment has been planned according to current medical practices, but problems sometimes occur. Call your health care provider if you have any problems or questions after your procedure. WHAT TO EXPECT AFTER THE PROCEDURE After the procedure, it is typical to experience:  Sleepiness.  Nausea and vomiting. HOME CARE INSTRUCTIONS  For the first 24 hours after general anesthesia:  Have a responsible person with you.  Do not drive a car. If you are alone, do not take public transportation.  Do not drink alcohol.  Do not take medicine that has not been prescribed by your health care provider.  Do not sign important papers or make important decisions.  You may resume a normal diet and activities as directed by your health care provider.  Change bandages (dressings) as directed.  If you have questions or problems that seem related to general anesthesia, call the hospital and ask for the anesthetist or anesthesiologist on call. SEEK MEDICAL CARE IF:  You have nausea and vomiting that continue the day after anesthesia.  You develop a rash. SEEK IMMEDIATE MEDICAL CARE IF:   You have difficulty breathing.  You have chest pain.  You have any allergic problems. Document Released: 11/06/2000 Document Revised: 08/05/2013 Document Reviewed: 02/13/2013 [facility] Patient Information 2015 [place], [place]. This information is not intended to replace advice given to you by your health care provider. Make sure  you discuss any questions you have with your health care provider.

## 2015-03-31 NOTE — Transfer of Care (Signed)
Immediate Anesthesia Transfer of Care Note  Patient: Steven Cain  Procedure(s) Performed: Procedure(s): CYSTOSCOPY WITH LEFT RETROGRADE PYELOGRAM/URETERAL STENT EXCHANGE (Left)  Patient Location: PACU  Anesthesia Type:General  Level of Consciousness:  sedated, patient cooperative and responds to stimulation  Airway & Oxygen Therapy:Patient Spontanous Breathing and Patient connected to face mask oxgen  Post-op Assessment:  Report given to PACU RN and Post -op Vital signs reviewed and stable  Post vital signs:  Reviewed and stable  Last Vitals:  Filed Vitals:   03/31/15 1044  BP: 142/74  Pulse: 72  Temp: 36.7 C  Resp: 18    Complications: No apparent anesthesia complications

## 2015-03-31 NOTE — Brief Op Note (Signed)
03/31/2015  12:53 PM  PATIENT:  Steven Cain  81 y.o. male  PRE-OPERATIVE DIAGNOSIS:  LEFT URETERAL STRICTURE  POST-OPERATIVE DIAGNOSIS:  left ureteral stricture  PROCEDURE:  Procedure(s): CYSTOSCOPY WITH LEFT RETROGRADE PYELOGRAM/URETERAL STENT EXCHANGE (Left)  SURGEON:  Surgeon(s) and Role:    * [name], MD - Primary  PHYSICIAN ASSISTANT:   ASSISTANTS: none   ANESTHESIA:   general  EBL:     BLOOD ADMINISTERED:none  DRAINS: none   LOCAL MEDICATIONS USED:  NONE  SPECIMEN:  No Specimen  DISPOSITION OF SPECIMEN:  N/A  COUNTS:  YES  TOURNIQUET:  * No tourniquets in log *  DICTATION: .Other Dictation: Dictation Number [id number]  PLAN OF CARE: Discharge to home after PACU  PATIENT DISPOSITION:  PACU - hemodynamically stable.   Delay start of Pharmacological VTE agent (>24hrs) due to surgical blood loss or risk of bleeding: yes

## 2015-03-31 NOTE — H&P (Signed)
Steven Cain is an 81 y.o. male.    Chief Complaint: Pre-OP Left Ureteral Stent Change  HPI:   1 - Obstructing Left Ureteral Stricture - worrisome soft tissue thickening of distal left ureter by CT and operative retrograde pyelogram 05/06/14. Now s/p stenting. No bladder lesion or additional lesions by imaging. He has h/o extensive smokeless tobacco exposure.   Recent Course: 05/2014 - Left Ureteroscopy / biopsy / brushing / washing / stent change (6x26)- path just mild atypia only, no frank carcinoma,  10/2014 - Repeat Left Ureteroscopy / Biopsy / stent exchange - path benign, no progression of stricture area, minimal stent encrustation  2 - Incontinence - Long h/o near total incontinence s/p TURP previously by Dr. Rosana Hoes. Manges with adult diapers x years.  PMH sig for Billroth II procedure R+Y, bilateral inguinal hernia repair with mesh, alcoholism (now very involved and compliant with AA)  His PCP is Dr. Virgina Jock.  Today [name] is seen to proceed with left ureteral stent change. No interval febrile UTI. He has been on Bactrim proph prior to today to help reduce bacterial load of his chronic GU colonization.   Past Medical History  Diagnosis Date  . Depression   . Hypertension   . Perforated, severe stomach ulcer   . GI bleed     Past Surgical History  Procedure Laterality Date  . Abdominal surgery    . Transurethral resection of prostate    . Cystoscopy w/ ureteral stent placement Left 05/06/2014    Procedure: CYSTOSCOPY WITH RETROGRADE PYELOGRAM/URETERAL STENT PLACEMENT;  Surgeon: [name], MD;  Location: [facility];  Service: Urology;  Laterality: Left;  . Cystoscopy/retrograde/ureteroscopy Left 06/03/2014    Procedure: CYSTOSCOPY/RETROGRADE/URETEROSCOPY WITH BIOPSY,STENT EXCHANGE;  Surgeon: [name], MD;  Location: [facility];  Service: Urology;  Laterality: Left;  . Gastric roux-en-y    . Esophagogastroduodenoscopy N/A 09/28/2014    Procedure: ESOPHAGOGASTRODUODENOSCOPY  (EGD);  Surgeon: Beryle Beams, MD;  Location: [facility];  Service: Endoscopy;  Laterality: N/A;  . Colonoscopy N/A 09/29/2014    Procedure: COLONOSCOPY;  Surgeon: Beryle Beams, MD;  Location: [facility];  Service: Endoscopy;  Laterality: N/A;  . Cystoscopy with retrograde pyelogram, ureteroscopy and stent placement Left 10/21/2014    Procedure: CYSTOSCOPY WITH RETROGRADE PYELOGRAM, URETEROSCOPY,BIOPSY AND STENT CHANGE;  Surgeon: [name], MD;  Location: [facility];  Service: Urology;  Laterality: Left;  . Hernia repair      bilateral hernia repairs     No family history on file. Social History:  reports that he has never smoked. His smokeless tobacco use includes Chew. He reports that he does not drink alcohol or use illicit drugs.  Allergies: No Known Allergies  No prescriptions prior to admission    Results for orders placed or performed during the hospital encounter of 03/29/15 (from the past 48 hour(s))  CBC     Status: Abnormal   Collection Time: 03/29/15 10:00 AM  Result Value Ref Range   WBC 7.7 4.0 - 10.5 K/uL   RBC 4.68 4.22 - 5.81 MIL/uL   Hemoglobin 13.0 13.0 - 17.0 g/dL   HCT 40.9 39.0 - 52.0 %   MCV 87.4 78.0 - 100.0 fL   MCH 27.8 26.0 - 34.0 pg   MCHC 31.8 30.0 - 36.0 g/dL   RDW 15.6 (H) 11.5 - 15.5 %   Platelets 200 150 - 400 K/uL  Basic metabolic panel     Status: Abnormal   Collection Time: 03/29/15 10:00 AM  Result  Value Ref Range   Sodium 140 135 - 145 mmol/L   Potassium 4.0 3.5 - 5.1 mmol/L   Chloride 107 101 - 111 mmol/L   CO2 26 22 - 32 mmol/L   Glucose, Bld 106 (H) 65 - 99 mg/dL   BUN 18 6 - 20 mg/dL   Creatinine, Ser 1.33 (H) 0.61 - 1.24 mg/dL   Calcium 9.2 8.9 - 10.3 mg/dL   GFR calc non Af Amer 48 (L) >60 mL/min   GFR calc Af Amer 56 (L) >60 mL/min    Comment: (NOTE) The eGFR has been calculated using the CKD EPI equation. This calculation has not been validated in all clinical situations. eGFR's persistently <60 mL/min signify possible  Chronic Kidney Disease.    Anion gap 7 5 - 15   No results found.  Review of Systems  Constitutional: Negative.  Negative for fever and chills.  HENT: Negative.   Eyes: Negative.   Respiratory: Negative.   Cardiovascular: Negative.   Gastrointestinal: Negative.  Negative for nausea and vomiting.  Genitourinary: Positive for urgency and frequency.  Musculoskeletal: Negative.   Skin: Negative.   Neurological: Negative.   Endo/Heme/Allergies: Negative.   Psychiatric/Behavioral: Positive for memory loss and substance abuse.    There were no vitals taken for this visit. Physical Exam  Constitutional: He appears well-developed.  At baseline  HENT:  Head: Normocephalic.  Eyes: Pupils are equal, round, and reactive to light.  Neck: Normal range of motion.  Cardiovascular: Normal rate.   Respiratory: Effort normal.  GI: Soft.  Genitourinary:  No CVAT  Musculoskeletal: Normal range of motion.  Neurological: He is alert.  Skin: Skin is warm.  Psychiatric: He has a normal mood and affect. His behavior is normal. Judgment and thought content normal.     Assessment/Plan  1 - Obstructing Left Ureteral Stricture - likely benign stricture. As fairly long segment and obstructing, rec Q40mosent changes for now. Will try to refrain from further ureteroscopy / biopsy unless clinical scenario changes. At his age, comorbidity, and extensive h/o intra-abdominal surgeries, I do not favor any sort of complex reconstruction.   He is due for stent change and will proceed today as planned, possibly upsize to 7x26 pending anatomy. Risks, benefits, alternatives explained.   2 - Incontinence - continue current regimen.    Ilda Laskin 03/31/2015, 6:38 AM

## 2015-03-31 NOTE — Anesthesia Postprocedure Evaluation (Signed)
  Anesthesia Post-op Note  Patient: Steven Cain  Procedure(s) Performed: Procedure(s) (LRB): CYSTOSCOPY WITH LEFT RETROGRADE PYELOGRAM/URETERAL STENT EXCHANGE (Left)  Patient Location: PACU  Anesthesia Type: General  Level of Consciousness: awake and alert   Airway and Oxygen Therapy: Patient Spontanous Breathing  Post-op Pain: mild  Post-op Assessment: Post-op Vital signs reviewed, Patient's Cardiovascular Status Stable, Respiratory Function Stable, Patent Airway and No signs of Nausea or vomiting  Last Vitals:  Filed Vitals:   03/31/15 1433  BP: 177/66  Pulse: 60  Temp: 36.4 C  Resp: 16    Post-op Vital Signs: stable   Complications: No apparent anesthesia complications

## 2015-03-31 NOTE — Anesthesia Procedure Notes (Signed)
Procedure Name: LMA Insertion Date/Time: 03/31/2015 12:36 PM Performed by: Lajuana Carry E Pre-anesthesia Checklist: Patient identified, Emergency Drugs available, Suction available and Patient being monitored Patient Re-evaluated:Patient Re-evaluated prior to inductionOxygen Delivery Method: Circle system utilized Preoxygenation: Pre-oxygenation with 100% oxygen Intubation Type: IV induction Ventilation: Mask ventilation without difficulty LMA: LMA inserted LMA Size: 5.0 Placement Confirmation: positive ETCO2 and breath sounds checked- equal and bilateral Dental Injury: Teeth and Oropharynx as per pre-operative assessment

## 2015-03-31 NOTE — Anesthesia Preprocedure Evaluation (Addendum)
Anesthesia Evaluation  Patient identified by MRN, date of birth, ID band Patient awake    Reviewed: Allergy & Precautions, NPO status , Patient's Chart, lab work & pertinent test results  Airway Mallampati: II  TM Distance: >3 FB Neck ROM: Full    Dental no notable dental hx.    Pulmonary neg pulmonary ROS,  breath sounds clear to auscultation  Pulmonary exam normal       Cardiovascular hypertension, Pt. on medications Normal cardiovascular examRhythm:Regular Rate:Normal     Neuro/Psych PSYCHIATRIC DISORDERS Depression negative neurological ROS     GI/Hepatic Neg liver ROS, PUD,   Endo/Other  negative endocrine ROS  Renal/GU Renal InsufficiencyRenal diseaseCKD stage 3  negative genitourinary   Musculoskeletal negative musculoskeletal ROS (+)   Abdominal (+) + obese,   Peds negative pediatric ROS (+)  Hematology  (+) anemia ,   Anesthesia Other Findings   Reproductive/Obstetrics negative OB ROS                            Anesthesia Physical Anesthesia Plan  ASA: II  Anesthesia Plan: General   Post-op Pain Management:    Induction: Intravenous  Airway Management Planned: LMA  Additional Equipment:   Intra-op Plan:   Post-operative Plan: Extubation in OR  Informed Consent: I have reviewed the patients History and Physical, chart, labs and discussed the procedure including the risks, benefits and alternatives for the proposed anesthesia with the patient or authorized representative who has indicated his/her understanding and acceptance.   Dental advisory given  Plan Discussed with: CRNA  Anesthesia Plan Comments:         Anesthesia Quick Evaluation

## 2015-04-01 ENCOUNTER — Encounter (HOSPITAL_COMMUNITY): Payer: Self-pay | Admitting: Urology

## 2015-04-01 NOTE — Op Note (Signed)
NAME:  Steven Cain, Steven Cain NO.:  [id number]  MEDICAL RECORD NO.:  [id number]  [name]                         FACILITY:  [facility]  PHYSICIAN:  [name], MD     DATE OF BIRTH:  1930/11/18  DATE OF PROCEDURE:  03/31/2015                              OPERATIVE REPORT  DIAGNOSES:  Chronic left ureteral stricture, multiple medical comorbidities, and prior abdominal surgery.  PROCEDURE: 1. Cystoscopy with left retrograde pyelogram and interpretation. 2. Exchange of left ureteral stent, 7 x 26, no tether.  ESTIMATED BLOOD LOSS:  Nil.  COMPLICATIONS:  None.  SPECIMEN:  Left ureteral stent for discard.  FINDINGS: 1. Hypospadiac urethra. 2. Very mild left hydronephrosis with ureteral nephrosis and relative     narrowing in the mid ureter consistent with a chronic stricture. 3. Successful placement of left ureteral stent, proximal in renal     pelvis and distal in urinary bladder.  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  He now presents for planned interval stent change.  No recent fevers.  He has been on antimicrobial prophylaxis preoperatively to reduce his known genitourinary bacterial colonization.  Informed consent was obtained and placed in the medical record.  PROCEDURE IN DETAIL:  [name] verified. Procedure being cysto and left stent change confirmed.  Procedure was carried out.  Time-out was performed.  Intravenous antibiotics administered.  General LMA anesthesia was introduced.  The patient was placed into a low lithotomy position.  Sterile field was created by prepping and draping the patient's penis, perineum, and proximal thighs using iodine x3.   Next, cystourethroscopy was performed using a 23- French rigid cystoscope with 12-degree offset lens.  The pendulous urethra was hypospadiac with urethral meatus at approximately the distal quarter of the shaft.  Otherwise, the urethra was unremarkable.  There was prior TURP defect.  Distal end of the ureteral stent was seen, exiting the left ureteral orifice, which was grasped with cold grasper device at the level of the urethral meatus, through which a 0.038 ZIPwire was advanced at the level of the upper pole.  The stent was exchanged for a 6-French open-ended catheter and left retrograde pyelogram was obtained.  Left retrograde pyelogram demonstrated a single left ureter with single system left kidney.  There was very mild hydronephrosis with relative narrowing of the middle third of the ureter consistent with known chronic stricture.  The ZIPwire was once again advanced and the open- ended catheter was exchanged for a new 7 x 26 Contour type stent.  Good proximal and distal deployment were noted.  Efflux of urine was seen around and through the distal end of the stent.  Bladder was emptied per cystoscope.  Procedure was then terminated.  The patient tolerated the procedure well.  There were no immediate periprocedural complications.  The patient was taken to postanesthesia care unit in stable condition.          ______________________________  [name], MD     TM/MEDQ  D:  03/31/2015  T:  04/01/2015  Job:  [id number]

## 2015-04-12 DIAGNOSIS — D485 Neoplasm of uncertain behavior of skin: Secondary | ICD-10-CM | POA: Diagnosis not present

## 2015-04-12 DIAGNOSIS — H11432 Conjunctival hyperemia, left eye: Secondary | ICD-10-CM | POA: Diagnosis not present

## 2015-04-12 DIAGNOSIS — H11433 Conjunctival hyperemia, bilateral: Secondary | ICD-10-CM | POA: Diagnosis not present

## 2015-04-15 DIAGNOSIS — B078 Other viral warts: Secondary | ICD-10-CM | POA: Diagnosis not present

## 2015-04-15 DIAGNOSIS — D485 Neoplasm of uncertain behavior of skin: Secondary | ICD-10-CM | POA: Diagnosis not present

## 2015-06-21 DIAGNOSIS — N135 Crossing vessel and stricture of ureter without hydronephrosis: Secondary | ICD-10-CM | POA: Diagnosis not present

## 2015-06-21 DIAGNOSIS — N3941 Urge incontinence: Secondary | ICD-10-CM | POA: Diagnosis not present

## 2015-08-24 ENCOUNTER — Other Ambulatory Visit: Payer: Self-pay | Admitting: Urology

## 2015-09-06 NOTE — Patient Instructions (Addendum)
YOUR PROCEDURE IS SCHEDULED ON :  09/15/15  REPORT TO [facility] MAIN ENTRANCE FOLLOW SIGNS TO EAST ELEVATOR - GO TO 3rd FLOOR CHECK IN AT 3 EAST NURSES STATION (SHORT STAY) AT:  8:00 AM  CALL THIS NUMBER IF YOU HAVE PROBLEMS THE MORNING OF SURGERY [phone]  REMEMBER:ONLY 1 PER PERSON MAY GO TO SHORT STAY WITH YOU TO GET READY THE MORNING OF YOUR SURGERY  DO NOT EAT FOOD OR DRINK LIQUIDS AFTER MIDNIGHT  TAKE THESE MEDICINES THE MORNING OF SURGERY: PAXIL  YOU MAY NOT HAVE ANY METAL ON YOUR BODY INCLUDING HAIR PINS AND PIERCING'S. DO NOT WEAR JEWELRY, MAKEUP, LOTIONS, POWDERS OR PERFUMES. DO NOT WEAR NAIL POLISH. DO NOT SHAVE 48 HRS PRIOR TO SURGERY. MEN MAY SHAVE FACE AND NECK.  DO NOT [place]. [facility] IS NOT RESPONSIBLE FOR VALUABLES.  CONTACTS, DENTURES OR PARTIALS MAY NOT BE WORN TO SURGERY. LEAVE SUITCASE IN CAR. CAN BE BROUGHT TO ROOM AFTER SURGERY.  PATIENTS DISCHARGED THE DAY OF SURGERY WILL NOT BE ALLOWED TO DRIVE HOME.  PLEASE READ OVER THE FOLLOWING INSTRUCTION SHEETS _________________________________________________________________________________                                           - PREPARING FOR SURGERY  Before surgery, you can play an important role.  Because skin is not sterile, your skin needs to be as free of germs as possible.  You can reduce the number of germs on your skin by washing with CHG (chlorahexidine gluconate) soap before surgery.  CHG is an antiseptic cleaner which kills germs and bonds with the skin to continue killing germs even after washing. Please DO NOT use if you have an allergy to CHG or antibacterial soaps.  If your skin becomes reddened/irritated stop using the CHG and inform your nurse when you arrive at Short Stay. Do not shave (including legs and underarms) for at least 48 hours prior to the first CHG shower.  You may shave your face. Please follow these instructions  carefully:   1.  Shower with CHG Soap the night before surgery and the  morning of Surgery.   2.  If you choose to wash your hair, wash your hair first as usual with your  normal  Shampoo.   3.  After you shampoo, rinse your hair and body thoroughly to remove the  shampoo.                                         4.  Use CHG as you would any other liquid soap.  You can apply chg directly  to the skin and wash . Gently wash with scrungie or clean wascloth    5.  Apply the CHG Soap to your body ONLY FROM THE NECK DOWN.   Do not use on open                           Wound or open sores. Avoid contact with eyes, ears mouth and genitals (private parts).                        Genitals (private parts) with your normal soap.  6.  Wash thoroughly, paying special attention to the area where your surgery  will be performed.   7.  Thoroughly rinse your body with warm water from the neck down.   8.  DO NOT shower/wash with your normal soap after using and rinsing off  the CHG Soap .                9.  Pat yourself dry with a clean towel.             10.  Wear clean night clothes to bed after shower             11.  Place clean sheets on your bed the night of your first shower and do not  sleep with pets.  Day of Surgery : Do not apply any lotions/deodorants the morning of surgery.  Please wear clean clothes to the hospital/surgery center.  FAILURE TO FOLLOW THESE INSTRUCTIONS MAY RESULT IN THE CANCELLATION OF YOUR SURGERY    PATIENT SIGNATURE_________________________________  ______________________________________________________________________

## 2015-09-07 ENCOUNTER — Encounter (HOSPITAL_COMMUNITY)
Admission: RE | Admit: 2015-09-07 | Discharge: 2015-09-07 | Disposition: A | Discharge status: Home / Self Care | Payer: Medicare Other | Source: Ambulatory Visit | Attending: Urology | Admitting: Urology

## 2015-09-07 ENCOUNTER — Encounter (HOSPITAL_COMMUNITY): Payer: Self-pay

## 2015-09-07 DIAGNOSIS — Z01812 Encounter for preprocedural laboratory examination: Secondary | ICD-10-CM | POA: Diagnosis not present

## 2015-09-07 DIAGNOSIS — Z01810 Encounter for preprocedural cardiovascular examination: Secondary | ICD-10-CM | POA: Diagnosis not present

## 2015-09-07 HISTORY — DX: Unspecified osteoarthritis, unspecified site: M19.90

## 2015-09-07 HISTORY — DX: Personal history of other medical treatment: Z92.89

## 2015-09-07 HISTORY — DX: Spinal stenosis, site unspecified: M48.00

## 2015-09-07 HISTORY — DX: Crossing vessel and stricture of ureter without hydronephrosis: N13.5

## 2015-09-07 LAB — BASIC METABOLIC PANEL
ANION GAP: 12 (ref 5–15)
BUN: 18 mg/dL (ref 6–20)
CO2: 25 mmol/L (ref 22–32)
Calcium: 9.4 mg/dL (ref 8.9–10.3)
Chloride: 103 mmol/L (ref 101–111)
Creatinine, Ser: 1.45 mg/dL — ABNORMAL HIGH (ref 0.61–1.24)
GFR calc Af Amer: 50 mL/min — ABNORMAL LOW (ref >60)
GFR calc non Af Amer: 43 mL/min — ABNORMAL LOW (ref >60)
GLUCOSE: 115 mg/dL — AB (ref 65–99)
POTASSIUM: 4.4 mmol/L (ref 3.5–5.1)
Sodium: 140 mmol/L (ref 135–145)

## 2015-09-07 LAB — CBC
HEMATOCRIT: 42 % (ref 39.0–52.0)
Hemoglobin: 13.4 g/dL (ref 13.0–17.0)
MCH: 29.1 pg (ref 26.0–34.0)
MCHC: 31.9 g/dL (ref 30.0–36.0)
MCV: 91.1 fL (ref 78.0–100.0)
Platelets: 229 10*3/uL (ref 150–400)
RBC: 4.61 MIL/uL (ref 4.22–5.81)
RDW: 14.3 % (ref 11.5–15.5)
WBC: 8.3 10*3/uL (ref 4.0–10.5)

## 2015-09-14 MED ORDER — GENTAMICIN SULFATE 40 MG/ML IJ SOLN
360.0000 mg | INTRAVENOUS | Status: AC
  Administered 2015-09-15: 360 mg via INTRAVENOUS
  Filled 2015-09-14: qty 9

## 2015-09-15 ENCOUNTER — Encounter (HOSPITAL_COMMUNITY): Payer: Self-pay | Admitting: Anesthesiology

## 2015-09-15 ENCOUNTER — Ambulatory Visit (HOSPITAL_COMMUNITY)
Admission: RE | Admit: 2015-09-15 | Discharge: 2015-09-15 | Disposition: A | Discharge status: Home / Self Care | Payer: Medicare Other | Source: Ambulatory Visit | Attending: Urology | Admitting: Urology

## 2015-09-15 ENCOUNTER — Ambulatory Visit (HOSPITAL_COMMUNITY): Payer: Medicare Other | Admitting: Anesthesiology

## 2015-09-15 ENCOUNTER — Encounter (HOSPITAL_COMMUNITY)
Admission: RE | Disposition: A | Discharge status: Home / Self Care | Payer: Self-pay | Source: Ambulatory Visit | Attending: Urology

## 2015-09-15 DIAGNOSIS — T85698A Other mechanical complication of other specified internal prosthetic devices, implants and grafts, initial encounter: Secondary | ICD-10-CM | POA: Diagnosis not present

## 2015-09-15 DIAGNOSIS — M1990 Unspecified osteoarthritis, unspecified site: Secondary | ICD-10-CM | POA: Diagnosis not present

## 2015-09-15 DIAGNOSIS — N131 Hydronephrosis with ureteral stricture, not elsewhere classified: Secondary | ICD-10-CM | POA: Insufficient documentation

## 2015-09-15 DIAGNOSIS — R32 Unspecified urinary incontinence: Secondary | ICD-10-CM | POA: Diagnosis not present

## 2015-09-15 DIAGNOSIS — M4800 Spinal stenosis, site unspecified: Secondary | ICD-10-CM | POA: Diagnosis not present

## 2015-09-15 DIAGNOSIS — I10 Essential (primary) hypertension: Secondary | ICD-10-CM | POA: Insufficient documentation

## 2015-09-15 DIAGNOSIS — N135 Crossing vessel and stricture of ureter without hydronephrosis: Secondary | ICD-10-CM | POA: Diagnosis not present

## 2015-09-15 DIAGNOSIS — Z6831 Body mass index (BMI) 31.0-31.9, adult: Secondary | ICD-10-CM | POA: Insufficient documentation

## 2015-09-15 DIAGNOSIS — E669 Obesity, unspecified: Secondary | ICD-10-CM | POA: Insufficient documentation

## 2015-09-15 DIAGNOSIS — F17220 Nicotine dependence, chewing tobacco, uncomplicated: Secondary | ICD-10-CM | POA: Diagnosis not present

## 2015-09-15 HISTORY — PX: CYSTOSCOPY W/ URETERAL STENT PLACEMENT: SHX1429

## 2015-09-15 HISTORY — DX: Unspecified hearing loss, unspecified ear: H91.90

## 2015-09-15 SURGERY — CYSTOSCOPY, WITH RETROGRADE PYELOGRAM AND URETERAL STENT INSERTION
Anesthesia: General | Laterality: Left

## 2015-09-15 MED ORDER — IOHEXOL 300 MG/ML  SOLN
INTRAMUSCULAR | Status: DC | PRN
  Administered 2015-09-15: 16 mL via URETHRAL

## 2015-09-15 MED ORDER — FENTANYL CITRATE (PF) 100 MCG/2ML IJ SOLN: 25.0000 ug | INTRAMUSCULAR | Status: DC | PRN

## 2015-09-15 MED ORDER — LIDOCAINE HCL (CARDIAC) 20 MG/ML IV SOLN
INTRAVENOUS | Status: DC | PRN
Start: 2015-09-15 — End: 2015-09-15
  Administered 2015-09-15: 100 mg via INTRAVENOUS

## 2015-09-15 MED ORDER — PROPOFOL 10 MG/ML IV BOLUS
INTRAVENOUS | Status: DC | PRN
  Administered 2015-09-15: 125 mg via INTRAVENOUS

## 2015-09-15 MED ORDER — ONDANSETRON HCL 4 MG/2ML IJ SOLN
INTRAMUSCULAR | Status: DC | PRN
Start: 2015-09-15 — End: 2015-09-15
  Administered 2015-09-15: 4 mg via INTRAVENOUS

## 2015-09-15 MED ORDER — ONDANSETRON HCL 4 MG/2ML IJ SOLN: 4.0000 mg | Freq: Once | INTRAMUSCULAR | Status: DC | PRN

## 2015-09-15 MED ORDER — SODIUM CHLORIDE 0.9 % IR SOLN
Status: DC | PRN
  Administered 2015-09-15: 3000 mL via INTRAVESICAL

## 2015-09-15 MED ORDER — PROPOFOL 10 MG/ML IV BOLUS
INTRAVENOUS | Status: AC
Start: 2015-09-15 — End: 2015-09-15
  Filled 2015-09-15: qty 20

## 2015-09-15 MED ORDER — ONDANSETRON HCL 4 MG/2ML IJ SOLN
INTRAMUSCULAR | Status: AC
  Filled 2015-09-15: qty 2

## 2015-09-15 MED ORDER — LIDOCAINE HCL (CARDIAC) 20 MG/ML IV SOLN
INTRAVENOUS | Status: AC
  Filled 2015-09-15: qty 5

## 2015-09-15 MED ORDER — FENTANYL CITRATE (PF) 100 MCG/2ML IJ SOLN
INTRAMUSCULAR | Status: AC
  Filled 2015-09-15: qty 2

## 2015-09-15 MED ORDER — LACTATED RINGERS IV SOLN
INTRAVENOUS | Status: DC
  Administered 2015-09-15: 1000 mL via INTRAVENOUS

## 2015-09-15 MED ORDER — FENTANYL CITRATE (PF) 100 MCG/2ML IJ SOLN
INTRAMUSCULAR | Status: DC | PRN
  Administered 2015-09-15 (×2): 25 ug via INTRAVENOUS
  Administered 2015-09-15: 50 ug via INTRAVENOUS

## 2015-09-15 SURGICAL SUPPLY — 25 items
BASKET LASER NITINOL 1.9FR (BASKET) IMPLANT
BASKET STNLS GEMINI 4WIRE 3FR (BASKET) IMPLANT
BASKET ZERO TIP NITINOL 2.4FR (BASKET) IMPLANT
BSKT STON RTRVL 120 1.9FR (BASKET)
BSKT STON RTRVL GEM 120X11 3FR (BASKET)
BSKT STON RTRVL ZERO TP 2.4FR (BASKET)
CATH INTERMIT  6FR 70CM (CATHETERS) ×3 IMPLANT
CLOTH BEACON ORANGE TIMEOUT ST (SAFETY) ×3 IMPLANT
ELECT REM PT RETURN 9FT ADLT (ELECTROSURGICAL)
ELECTRODE REM PT RTRN 9FT ADLT (ELECTROSURGICAL) IMPLANT
FIBER LASER FLEXIVA 1000 (UROLOGICAL SUPPLIES) IMPLANT
FIBER LASER FLEXIVA 200 (UROLOGICAL SUPPLIES) IMPLANT
FIBER LASER FLEXIVA 365 (UROLOGICAL SUPPLIES) IMPLANT
FIBER LASER FLEXIVA 550 (UROLOGICAL SUPPLIES) IMPLANT
FIBER LASER TRAC TIP (UROLOGICAL SUPPLIES) IMPLANT
GLOVE BIOGEL M STRL SZ7.5 (GLOVE) ×9 IMPLANT
GOWN STRL REUS W/TWL XL LVL3 (GOWN DISPOSABLE) ×6 IMPLANT
GUIDEWIRE ANG ZIPWIRE 038X150 (WIRE) ×3 IMPLANT
GUIDEWIRE STR DUAL SENSOR (WIRE) IMPLANT
IV NS IRRIG 3000ML ARTHROMATIC (IV SOLUTION) IMPLANT
PACK CYSTO (CUSTOM PROCEDURE TRAY) ×3 IMPLANT
STENT CONTOUR 7FR X 28 (STENTS) ×2 IMPLANT
SYRINGE 10CC LL (SYRINGE) IMPLANT
SYRINGE IRR TOOMEY STRL 70CC (SYRINGE) IMPLANT
TUBE FEEDING 8FR 16IN STR KANG (MISCELLANEOUS) ×3 IMPLANT

## 2015-09-15 NOTE — Brief Op Note (Signed)
09/15/2015  10:30 AM  PATIENT:  Steven Cain  82 y.o. male  PRE-OPERATIVE DIAGNOSIS:  CHRONIC LEFT URETERAL STRICTURE  POST-OPERATIVE DIAGNOSIS:  chronic left ureteral stricture  PROCEDURE:  Procedure(s): CYSTOSCOPY WITH LEFT RETROGRADE PYELOGRAM/URETERAL STENT EXCHANGE  (Left)  SURGEON:  Surgeon(s) and Role:    * [name], MD - Primary  PHYSICIAN ASSISTANT:   ASSISTANTS: none   ANESTHESIA:   general  EBL:     BLOOD ADMINISTERED:none  DRAINS: none   LOCAL MEDICATIONS USED:  NONE  SPECIMEN:  No Specimen  DISPOSITION OF SPECIMEN:  N/A  COUNTS:  YES  TOURNIQUET:  * No tourniquets in log *  DICTATION: .Other Dictation: Dictation Number [id number]  PLAN OF CARE: Discharge to home after PACU  PATIENT DISPOSITION:  PACU - hemodynamically stable.   Delay start of Pharmacological VTE agent (>24hrs) due to surgical blood loss or risk of bleeding: not applicable

## 2015-09-15 NOTE — Anesthesia Postprocedure Evaluation (Signed)
Anesthesia Post Note  Patient: LOCHLANN RAW  Procedure(s) Performed: Procedure(s) (LRB): CYSTOSCOPY WITH LEFT RETROGRADE PYELOGRAM/URETERAL STENT EXCHANGE  (Left)  Patient location during evaluation: PACU Anesthesia Type: General Level of consciousness: awake and alert Pain management: pain level controlled Vital Signs Assessment: post-procedure vital signs reviewed and stable Respiratory status: spontaneous breathing, nonlabored ventilation, respiratory function stable and patient connected to nasal cannula oxygen Cardiovascular status: blood pressure returned to baseline and stable Postop Assessment: no signs of nausea or vomiting Anesthetic complications: no    Last Vitals:  Filed Vitals:   09/15/15 1140 09/15/15 1233  BP: 160/75 154/70  Pulse: 68 68  Temp: 36.6 C   Resp: 12 12    Last Pain:  Filed Vitals:   09/15/15 1234  PainSc: 0-No pain                 Escarlet Saathoff JENNETTE

## 2015-09-15 NOTE — H&P (Signed)
Steven Cain is an 82 y.o. male.    Chief Complaint: PRe-OP LEFT Ureteral Stent Change  HPI:   1 - Obstructing Left Ureteral Stricture - worrisome soft tissue thickening of distal left ureter by CT and operative retrograde pyelogram 05/06/14. Now s/p stenting. No bladder lesion or additional lesions by imaging. He has h/o extensive smokeless tobacco exposure.   Recent Course: 05/2014 - Left Ureteroscopy / biopsy / brushing / washing / stent change (6x26)- path just mild atypia only, no frank carcinoma,  10/2014 - Repeat Left Ureteroscopy / Biopsy / stent exchange - path benign, no progression of stricture area, minimal stent encrustation;  03/2015 Left stent change (7x26), no intraluminal filling defects  2 - Incontinence - Long h/o near total incontinence s/p TURP previously by Dr. Rosana Hoes. Manages with adult diapers x years.  PMH sig for Billroth II procedure R+Y, bilateral inguinal hernia repair with mesh, alcoholism (now very involved and compliant with AA)  His PCP is Dr. Virgina Jock.  Today [name] is seen to proceed with elective left ureteral stent change. No interval fevers.   Past Medical History  Diagnosis Date  . Depression   . Hypertension   . Perforated, severe stomach ulcer ([place])   . GI bleed   . Arthritis   . Spinal stenosis   . Ureteral stricture     CHRONIC  . History of transfusion     Past Surgical History  Procedure Laterality Date  . Abdominal surgery    . Transurethral resection of prostate    . Cystoscopy w/ ureteral stent placement Left 05/06/2014    Procedure: CYSTOSCOPY WITH RETROGRADE PYELOGRAM/URETERAL STENT PLACEMENT;  Surgeon: [name], MD;  Location: [facility];  Service: Urology;  Laterality: Left;  . Cystoscopy/retrograde/ureteroscopy Left 06/03/2014    Procedure: CYSTOSCOPY/RETROGRADE/URETEROSCOPY WITH BIOPSY,STENT EXCHANGE;  Surgeon: [name], MD;  Location: [facility];  Service: Urology;  Laterality: Left;  . Gastric roux-en-y    .  Esophagogastroduodenoscopy N/A 09/28/2014    Procedure: ESOPHAGOGASTRODUODENOSCOPY (EGD);  Surgeon: Beryle Beams, MD;  Location: [facility];  Service: Endoscopy;  Laterality: N/A;  . Colonoscopy N/A 09/29/2014    Procedure: COLONOSCOPY;  Surgeon: Beryle Beams, MD;  Location: [facility];  Service: Endoscopy;  Laterality: N/A;  . Cystoscopy with retrograde pyelogram, ureteroscopy and stent placement Left 10/21/2014    Procedure: CYSTOSCOPY WITH RETROGRADE PYELOGRAM, URETEROSCOPY,BIOPSY AND STENT CHANGE;  Surgeon: [name], MD;  Location: [facility];  Service: Urology;  Laterality: Left;  . Hernia repair      bilateral hernia repairs   . Cystoscopy w/ ureteral stent placement Left 03/31/2015    Procedure: CYSTOSCOPY WITH LEFT RETROGRADE PYELOGRAM/URETERAL STENT EXCHANGE;  Surgeon: [name], MD;  Location: [facility];  Service: Urology;  Laterality: Left;    No family history on file. Social History:  reports that he has never smoked. His smokeless tobacco use includes Chew. He reports that he does not drink alcohol or use illicit drugs.  Allergies: No Known Allergies  No prescriptions prior to admission    No results found for this or any previous visit (from the past 48 hour(s)). No results found.  Review of Systems  Constitutional: Negative.  Negative for fever and chills.  HENT: Negative.   Eyes: Negative.   Respiratory: Negative.   Cardiovascular: Negative.   Gastrointestinal: Negative.  Negative for nausea.  Genitourinary: Positive for flank pain.  Musculoskeletal: Negative.   Skin: Negative.   Neurological: Negative.   Endo/Heme/Allergies: Negative.   Psychiatric/Behavioral: Negative.  There were no vitals taken for this visit. Physical Exam  Constitutional: He appears well-developed.  HENT:  Head: Normocephalic.  Eyes: Pupils are equal, round, and reactive to light.  Neck: Normal range of motion.  Cardiovascular: Normal rate.   Respiratory: Effort normal.   GI: Soft.  Genitourinary:  No CVAT  Musculoskeletal: Normal range of motion.  Neurological: He is alert.  At baseline  Skin: Skin is warm.  Psychiatric: He has a normal mood and affect. His behavior is normal.     Assessment/Plan      1 - Obstructing Left Ureteral Stricture - likely benign stricture. As fairly long segment and obstructing, rec Q27mo sent changes for now. Will try to refrain from further ureteroscopy / biopsy unless clinical scenario changes. At his age, comorbidity, and extensive h/o intra-abdominal surgeries, I do not favor any sort of complex reconstruction.   Proceed today as planned with stent change. Risks, benefits, alternatives discussed previouslyi on multiple ocasions and reiterated again today.   2 - Incontinence - continue current regimen.   Ehsan Corvin 09/15/2015, 6:29 AM

## 2015-09-15 NOTE — Anesthesia Procedure Notes (Signed)
Procedure Name: LMA Insertion Date/Time: 09/15/2015 10:12 AM Performed by: Riki Sheer Pre-anesthesia Checklist: Patient identified, Emergency Drugs available, Suction available, Patient being monitored and Timeout performed Patient Re-evaluated:Patient Re-evaluated prior to inductionOxygen Delivery Method: Circle system utilized Preoxygenation: Pre-oxygenation with 100% oxygen Intubation Type: IV induction Ventilation: Mask ventilation without difficulty LMA: LMA with gastric port inserted LMA Size: 4.0 Number of attempts: 1 Tube secured with: Tape Dental Injury: Teeth and Oropharynx as per pre-operative assessment

## 2015-09-15 NOTE — Discharge Instructions (Signed)
1 - You may have urinary urgency (bladder spasms) and bloody urine on / off with stent in place. This is normal. ° °2 - Call MD or go to ER for fever >102, severe pain / nausea / vomiting not relieved by medications, or acute change in medical status °General Anesthesia, Adult, Care After °Refer to this sheet in the next few weeks. These instructions provide you with information on caring for yourself after your procedure. Your health care provider may also give you more specific instructions. Your treatment has been planned according to current medical practices, but problems sometimes occur. Call your health care provider if you have any problems or questions after your procedure. °WHAT TO EXPECT AFTER THE PROCEDURE °After the procedure, it is typical to experience: °· Sleepiness. °· Nausea and vomiting. °HOME CARE INSTRUCTIONS °· For the first 24 hours after general anesthesia: °¨ Have a responsible person with you. °¨ Do not drive a car. If you are alone, do not take public transportation. °¨ Do not drink alcohol. °¨ Do not take medicine that has not been prescribed by your health care provider. °¨ Do not sign important papers or make important decisions. °¨ You may resume a normal diet and activities as directed by your health care provider. °· Change bandages (dressings) as directed. °· If you have questions or problems that seem related to general anesthesia, call the hospital and ask for the anesthetist or anesthesiologist on call. °SEEK MEDICAL CARE IF: °· You have nausea and vomiting that continue the day after anesthesia. °· You develop a rash. °SEEK IMMEDIATE MEDICAL CARE IF:  °· You have difficulty breathing. °· You have chest pain. °· You have any allergic problems. °  °This information is not intended to replace advice given to you by your health care provider. Make sure you discuss any questions you have with your health care provider. °  °Document Released: 11/06/2000 Document Revised: 08/21/2014  Document Reviewed: 11/29/2011 °Elsevier Interactive Patient Education ©2016 [employer] ° °

## 2015-09-15 NOTE — Transfer of Care (Signed)
Immediate Anesthesia Transfer of Care Note  Patient: Steven Cain  Procedure(s) Performed: Procedure(s): CYSTOSCOPY WITH LEFT RETROGRADE PYELOGRAM/URETERAL STENT EXCHANGE  (Left)  Patient Location: PACU  Anesthesia Type:General  Level of Consciousness: awake, alert  and oriented  Airway & Oxygen Therapy: Patient Spontanous Breathing and Patient connected to face mask oxygen  Post-op Assessment: Report given to RN and Post -op Vital signs reviewed and stable  Post vital signs: Reviewed and stable  Last Vitals:  Filed Vitals:   09/15/15 0851  BP: 155/87  Pulse: 78  Temp: 36.4 C  Resp: 16    Complications: No apparent anesthesia complications

## 2015-09-15 NOTE — Anesthesia Preprocedure Evaluation (Addendum)
Anesthesia Evaluation  Patient identified by MRN, date of birth, ID band Patient awake    Reviewed: Allergy & Precautions, NPO status , Patient's Chart, lab work & pertinent test results  History of Anesthesia Complications Negative for: history of anesthetic complications  Airway Mallampati: II  TM Distance: >3 FB Neck ROM: Full    Dental no notable dental hx. (+) Dental Advisory Given, Poor Dentition, Missing, Chipped   Pulmonary neg pulmonary ROS,    Pulmonary exam normal breath sounds clear to auscultation       Cardiovascular hypertension, Pt. on medications Normal cardiovascular exam Rhythm:Regular Rate:Normal     Neuro/Psych PSYCHIATRIC DISORDERS Anxiety Depression negative neurological ROS     GI/Hepatic Neg liver ROS, PUD,   Endo/Other  obesity  Renal/GU negative Renal ROS  negative genitourinary   Musculoskeletal  (+) Arthritis , Osteoarthritis,    Abdominal   Peds negative pediatric ROS (+)  Hematology negative hematology ROS (+)   Anesthesia Other Findings   Reproductive/Obstetrics negative OB ROS                           Anesthesia Physical Anesthesia Plan  ASA: II  Anesthesia Plan: General   Post-op Pain Management:    Induction: Intravenous  Airway Management Planned: LMA  Additional Equipment:   Intra-op Plan:   Post-operative Plan: Extubation in OR  Informed Consent: I have reviewed the patients History and Physical, chart, labs and discussed the procedure including the risks, benefits and alternatives for the proposed anesthesia with the patient or authorized representative who has indicated his/her understanding and acceptance.   Dental advisory given  Plan Discussed with: CRNA  Anesthesia Plan Comments:         Anesthesia Quick Evaluation

## 2015-09-16 ENCOUNTER — Encounter (HOSPITAL_COMMUNITY): Payer: Self-pay | Admitting: Urology

## 2015-09-16 NOTE — Op Note (Signed)
NAME:  Steven Cain, Steven Cain NO.:  [id number]  MEDICAL RECORD NO.:  [name]  [name]                         [name]  PHYSICIAN:  [name], MD     DATE OF BIRTH:  06/01/1931  DATE OF PROCEDURE:  09/15/2015                               OPERATIVE REPORT   PREOPERATIVE DIAGNOSIS:  Chronic left ureteral stricture, significant medical comorbidity.  PROCEDURE: 1. Cystoscopy with left retrograde pyelogram and interpretation. 2. Exchange of left ureteral stent, 7 x 26, Contour, no tether.  ESTIMATED BLOOD LOSS:  Nil.  COMPLICATIONS:  None.  SPECIMENS:  None.  FINDINGS: 1. Very mildly encrusted left ureteral stent.  This was removed in its     entirety. 2. Mild hydro-ureteral nephrosis to the area of narrowing in distal     ureter consistent with known chronic stricture. 3. Successful replacement of left ureteral stent, proximal in renal     pelvis and distal in urinary bladder.  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  We have elected given his comorbidity to manage his left stricture with chronic stents, change approximately q.6 monthly.  He has tolerated this for several years.  It has been approximately 6 months since the most recent stent change.  He now presents for elective exchange of the stent.  He has had no recent fevers.  Informed consent was obtained and placed in medical record.  PROCEDURE IN DETAIL:  [name] was verified. Procedure being left ureteral stent change was confirmed.  Procedure was carried out.  Time-out was performed.  Intravenous antibiotics were administered.  General LMA anesthesia was introduced.  The patient was placed into a low lithotomy position.  Sterile  field was created by prepping the patient's penis, perineum, proximal thighs using iodine x3. The patient does have distal hypospadias with his true meatus being just sub-glanular.  Cystourethroscopy was performed using a 23-French rigid cystoscope with 30-degree offset lens.  Inspection of anterior and posterior urethra unremarkable.  Inspection of urinary bladder revealed distal end of the left ureteral stent in situ with minimal encrustation. The right ureteral orifice was unremarkable.  The bladder was otherwise unremarkable.  Distal end of the left stent was grasped.  It was removed in its entirety, set aside for discard.  Next, a left ureteral orifice was cannulated with 6-French end-hole catheter and left retrograde pyelogram was obtained.  Left retrograde pyelogram demonstrated a single left ureter with single system left kidney.  There was an area of narrowing in the distal ureter close to the iliac crossing consistent with known stricture.  There was mild-to-moderate hydroureteronephrosis above this.  A 0.038 ZIPwire was advanced to the level of the upper pole and over which, a new 7 x 26 Contour-type stent was carefully placed using cystoscopic and fluoroscopic guidance.  Good proximal and distal deployment were noted. Efflux of urine was seen around and through the distal end of the stent, bladder was emptied per cystoscope.  Procedure  was then terminated.  The patient tolerated the procedure well with no immediate periprocedural complications.  The patient was taken to the Postanesthesia Care Unit in a stable condition.          ______________________________ [name], MD     TM/MEDQ  D:  09/15/2015  T:  09/15/2015  Job:  [id number]

## 2015-09-20 DIAGNOSIS — M4800 Spinal stenosis, site unspecified: Secondary | ICD-10-CM | POA: Diagnosis not present

## 2015-09-20 DIAGNOSIS — Z6832 Body mass index (BMI) 32.0-32.9, adult: Secondary | ICD-10-CM | POA: Diagnosis not present

## 2015-09-20 DIAGNOSIS — Z1389 Encounter for screening for other disorder: Secondary | ICD-10-CM | POA: Diagnosis not present

## 2015-09-20 DIAGNOSIS — R972 Elevated prostate specific antigen [PSA]: Secondary | ICD-10-CM | POA: Diagnosis not present

## 2015-09-20 DIAGNOSIS — Z23 Encounter for immunization: Secondary | ICD-10-CM | POA: Diagnosis not present

## 2015-09-20 DIAGNOSIS — G629 Polyneuropathy, unspecified: Secondary | ICD-10-CM | POA: Diagnosis not present

## 2015-09-20 DIAGNOSIS — D500 Iron deficiency anemia secondary to blood loss (chronic): Secondary | ICD-10-CM | POA: Diagnosis not present

## 2015-09-20 DIAGNOSIS — N183 Chronic kidney disease, stage 3 (moderate): Secondary | ICD-10-CM | POA: Diagnosis not present

## 2015-09-20 DIAGNOSIS — Z0000 Encounter for general adult medical examination without abnormal findings: Secondary | ICD-10-CM | POA: Diagnosis not present

## 2015-09-20 DIAGNOSIS — R600 Localized edema: Secondary | ICD-10-CM | POA: Diagnosis not present

## 2015-09-20 DIAGNOSIS — I878 Other specified disorders of veins: Secondary | ICD-10-CM | POA: Diagnosis not present

## 2015-09-20 DIAGNOSIS — N1330 Unspecified hydronephrosis: Secondary | ICD-10-CM | POA: Diagnosis not present

## 2015-10-09 ENCOUNTER — Emergency Department (HOSPITAL_COMMUNITY)
Admission: EM | Admit: 2015-10-09 | Discharge: 2015-10-09 | Disposition: A | Discharge status: Home / Self Care | Payer: Medicare Other | Attending: Emergency Medicine | Admitting: Emergency Medicine

## 2015-10-09 ENCOUNTER — Encounter (HOSPITAL_COMMUNITY): Payer: Self-pay | Admitting: Emergency Medicine

## 2015-10-09 ENCOUNTER — Emergency Department (HOSPITAL_COMMUNITY): Payer: Medicare Other

## 2015-10-09 DIAGNOSIS — N390 Urinary tract infection, site not specified: Secondary | ICD-10-CM | POA: Diagnosis not present

## 2015-10-09 DIAGNOSIS — F329 Major depressive disorder, single episode, unspecified: Secondary | ICD-10-CM | POA: Insufficient documentation

## 2015-10-09 DIAGNOSIS — I129 Hypertensive chronic kidney disease with stage 1 through stage 4 chronic kidney disease, or unspecified chronic kidney disease: Secondary | ICD-10-CM | POA: Insufficient documentation

## 2015-10-09 DIAGNOSIS — Z79899 Other long term (current) drug therapy: Secondary | ICD-10-CM | POA: Insufficient documentation

## 2015-10-09 DIAGNOSIS — N183 Chronic kidney disease, stage 3 unspecified: Secondary | ICD-10-CM

## 2015-10-09 DIAGNOSIS — Z8719 Personal history of other diseases of the digestive system: Secondary | ICD-10-CM | POA: Insufficient documentation

## 2015-10-09 DIAGNOSIS — R0602 Shortness of breath: Secondary | ICD-10-CM | POA: Insufficient documentation

## 2015-10-09 DIAGNOSIS — Z7982 Long term (current) use of aspirin: Secondary | ICD-10-CM | POA: Diagnosis not present

## 2015-10-09 DIAGNOSIS — R05 Cough: Secondary | ICD-10-CM | POA: Diagnosis not present

## 2015-10-09 DIAGNOSIS — R531 Weakness: Secondary | ICD-10-CM | POA: Diagnosis not present

## 2015-10-09 DIAGNOSIS — M1990 Unspecified osteoarthritis, unspecified site: Secondary | ICD-10-CM | POA: Insufficient documentation

## 2015-10-09 DIAGNOSIS — H9190 Unspecified hearing loss, unspecified ear: Secondary | ICD-10-CM | POA: Insufficient documentation

## 2015-10-09 LAB — CBC WITH DIFFERENTIAL/PLATELET
Basophils Absolute: 0 10*3/uL (ref 0.0–0.1)
Basophils Relative: 0 %
EOS PCT: 4 %
Eosinophils Absolute: 0.3 10*3/uL (ref 0.0–0.7)
HEMATOCRIT: 38.7 % — AB (ref 39.0–52.0)
Hemoglobin: 12.8 g/dL — ABNORMAL LOW (ref 13.0–17.0)
LYMPHS PCT: 20 %
Lymphs Abs: 1.7 10*3/uL (ref 0.7–4.0)
MCH: 29.2 pg (ref 26.0–34.0)
MCHC: 33.1 g/dL (ref 30.0–36.0)
MCV: 88.2 fL (ref 78.0–100.0)
MONOS PCT: 11 %
Monocytes Absolute: 0.9 10*3/uL (ref 0.1–1.0)
NEUTROS PCT: 65 %
Neutro Abs: 5.7 10*3/uL (ref 1.7–7.7)
PLATELETS: 167 10*3/uL (ref 150–400)
RBC: 4.39 MIL/uL (ref 4.22–5.81)
RDW: 13.8 % (ref 11.5–15.5)
WBC: 8.6 10*3/uL (ref 4.0–10.5)

## 2015-10-09 LAB — COMPREHENSIVE METABOLIC PANEL
ALT: 11 U/L — AB (ref 17–63)
AST: 23 U/L (ref 15–41)
Albumin: 3.5 g/dL (ref 3.5–5.0)
Alkaline Phosphatase: 80 U/L (ref 38–126)
Anion gap: 8 (ref 5–15)
BILIRUBIN TOTAL: 0.5 mg/dL (ref 0.3–1.2)
BUN: 24 mg/dL — AB (ref 6–20)
CHLORIDE: 103 mmol/L (ref 101–111)
CO2: 24 mmol/L (ref 22–32)
CREATININE: 1.58 mg/dL — AB (ref 0.61–1.24)
Calcium: 9 mg/dL (ref 8.9–10.3)
GFR calc Af Amer: 45 mL/min — ABNORMAL LOW (ref >60)
GFR, EST NON AFRICAN AMERICAN: 39 mL/min — AB (ref >60)
Glucose, Bld: 115 mg/dL — ABNORMAL HIGH (ref 65–99)
Potassium: 4.3 mmol/L (ref 3.5–5.1)
Sodium: 135 mmol/L (ref 135–145)
Total Protein: 7.1 g/dL (ref 6.5–8.1)

## 2015-10-09 LAB — URINALYSIS, ROUTINE W REFLEX MICROSCOPIC
Bilirubin Urine: NEGATIVE
GLUCOSE, UA: NEGATIVE mg/dL
HGB URINE DIPSTICK: NEGATIVE
Ketones, ur: NEGATIVE mg/dL
Nitrite: NEGATIVE
PROTEIN: NEGATIVE mg/dL
SPECIFIC GRAVITY, URINE: 1.01 (ref 1.005–1.030)
pH: 6 (ref 5.0–8.0)

## 2015-10-09 LAB — I-STAT CG4 LACTIC ACID, ED: Lactic Acid, Venous: 0.7 mmol/L (ref 0.5–2.0)

## 2015-10-09 LAB — INFLUENZA PANEL BY PCR (TYPE A & B)
H1N1 flu by pcr: NOT DETECTED
INFLAPCR: NEGATIVE
Influenza B By PCR: NEGATIVE

## 2015-10-09 LAB — URINE MICROSCOPIC-ADD ON
BACTERIA UA: NONE SEEN
RBC / HPF: NONE SEEN RBC/hpf (ref 0–5)
Squamous Epithelial / LPF: NONE SEEN

## 2015-10-09 MED ORDER — CEPHALEXIN 500 MG PO CAPS: 500.0000 mg | ORAL_CAPSULE | Freq: Three times a day (TID) | ORAL | Status: DC

## 2015-10-09 MED ORDER — DEXTROSE 5 % IV SOLN
1.0000 g | INTRAVENOUS | Status: DC
  Administered 2015-10-09: 1 g via INTRAVENOUS
  Filled 2015-10-09: qty 10

## 2015-10-09 MED ORDER — SODIUM CHLORIDE 0.9 % IV SOLN
INTRAVENOUS | Status: DC
  Administered 2015-10-09: 10 mL/h via INTRAVENOUS

## 2015-10-09 MED ORDER — SODIUM CHLORIDE 0.9 % IV BOLUS (SEPSIS)
1000.0000 mL | Freq: Once | INTRAVENOUS | Status: AC
  Administered 2015-10-09: 1000 mL via INTRAVENOUS

## 2015-10-09 NOTE — ED Provider Notes (Signed)
CSN: [id number]     Arrival date & time 10/09/15  1527 History   First MD Initiated Contact with Patient 10/09/15 1533     Chief Complaint  Patient presents with  . Shortness of Breath  . Cough  . Weakness     (Consider location/radiation/quality/duration/timing/severity/associated sxs/prior Treatment) HPI Comments: Patient here complaining of 23 history of cough and congestion with runny nose and sore throat. Temperature at home up to 101.3. Was given Motrin for this. Has had a cough has been productive of thick white mucus. No headache or photophobia. No rashes appreciated. No recent travel history. Has had some watery stools but no emesis. Nose and some myalgias as well as weakness. Denies any urinary symptoms. Has had a flu sent as well as a shot for pneumonia. Denies any sick exposures currently. Nothing makes his symptoms better.  Patient is a 82 y.o. male presenting with shortness of breath, cough, and weakness. The history is provided by the patient.  Shortness of Breath Associated symptoms: cough   Cough Associated symptoms: shortness of breath   Weakness Associated symptoms include shortness of breath.    Past Medical History  Diagnosis Date  . Depression   . Hypertension   . Perforated, severe stomach ulcer ([place])   . GI bleed   . Arthritis   . Spinal stenosis   . Ureteral stricture     CHRONIC  . History of transfusion   . Hard of hearing    Past Surgical History  Procedure Laterality Date  . Abdominal surgery    . Transurethral resection of prostate    . Cystoscopy w/ ureteral stent placement Left 05/06/2014    Procedure: CYSTOSCOPY WITH RETROGRADE PYELOGRAM/URETERAL STENT PLACEMENT;  Surgeon: [name], MD;  Location: [facility];  Service: Urology;  Laterality: Left;  . Cystoscopy/retrograde/ureteroscopy Left 06/03/2014    Procedure: CYSTOSCOPY/RETROGRADE/URETEROSCOPY WITH BIOPSY,STENT EXCHANGE;  Surgeon: [name], MD;  Location: [facility];  Service:  Urology;  Laterality: Left;  . Gastric roux-en-y    . Esophagogastroduodenoscopy N/A 09/28/2014    Procedure: ESOPHAGOGASTRODUODENOSCOPY (EGD);  Surgeon: Beryle Beams, MD;  Location: [facility];  Service: Endoscopy;  Laterality: N/A;  . Colonoscopy N/A 09/29/2014    Procedure: COLONOSCOPY;  Surgeon: Beryle Beams, MD;  Location: [facility];  Service: Endoscopy;  Laterality: N/A;  . Cystoscopy with retrograde pyelogram, ureteroscopy and stent placement Left 10/21/2014    Procedure: CYSTOSCOPY WITH RETROGRADE PYELOGRAM, URETEROSCOPY,BIOPSY AND STENT CHANGE;  Surgeon: [name], MD;  Location: [facility];  Service: Urology;  Laterality: Left;  . Hernia repair      bilateral hernia repairs   . Cystoscopy w/ ureteral stent placement Left 03/31/2015    Procedure: CYSTOSCOPY WITH LEFT RETROGRADE PYELOGRAM/URETERAL STENT EXCHANGE;  Surgeon: [name], MD;  Location: [facility];  Service: Urology;  Laterality: Left;  . Cystoscopy w/ ureteral stent placement Left 09/15/2015    Procedure: CYSTOSCOPY WITH LEFT RETROGRADE PYELOGRAM/URETERAL STENT EXCHANGE ;  Surgeon: [name], MD;  Location: [facility];  Service: Urology;  Laterality: Left;   History reviewed. No pertinent family history. Social History  Substance Use Topics  . Smoking status: Never Smoker   . Smokeless tobacco: Current User    Types: Chew  . Alcohol Use: No    Review of Systems  Respiratory: Positive for cough and shortness of breath.   Neurological: Positive for weakness.  All other systems reviewed and are negative.     Allergies  Review of patient's allergies indicates no known allergies.  Home Medications   Prior to Admission medications   Medication Sig Start Date End Date Taking? Authorizing Provider  Ascorbic Acid (VITAMIN C) 1000 MG tablet Take 1,000 mg by mouth daily.    Historical Provider, MD  aspirin 325 MG EC tablet Take 325 mg by mouth daily.    Historical Provider, MD  co-enzyme Q-10 30 MG capsule Take  30 mg by mouth every morning.     Historical Provider, MD  Cyanocobalamin (VITAMIN B-12 IJ) Inject 1 mL as directed every 30 (thirty) days.    Historical Provider, MD  fish oil-omega-3 fatty acids 1000 MG capsule Take 2 g by mouth every morning.     Historical Provider, MD  meclizine (ANTIVERT) 25 MG tablet Take 25 mg by mouth 2 (two) times daily as needed for dizziness.    Historical Provider, MD  Multiple Vitamin (MULTIVITAMIN WITH MINERALS) TABS tablet Take 1 tablet by mouth every morning.    Historical Provider, MD  PARoxetine (PAXIL) 20 MG tablet Take 20-40 mg by mouth 3 (three) times daily. If sleepy only take 1 tablet    Historical Provider, MD  psyllium (HYDROCIL/METAMUCIL) 95 % PACK Take 1 packet by mouth daily.    Historical Provider, MD  testosterone cypionate (DEPOTESTOTERONE CYPIONATE) 200 MG/ML injection Inject 60 mg into the muscle every 30 (thirty) days. 3/10 of cc.    Historical Provider, MD  vitamin E 100 UNIT capsule Take 100 Units by mouth every morning.     Historical Provider, MD   BP 118/82 mmHg  Pulse 77  Temp(Src) 98.7 F (37.1 C) (Oral)  Resp 18  SpO2 96% Physical Exam  Constitutional: He is oriented to person, place, and time. He appears well-developed and well-nourished.  Non-toxic appearance. No distress.  HENT:  Head: Normocephalic and atraumatic.  Eyes: Conjunctivae, EOM and lids are normal. Pupils are equal, round, and reactive to light.  Neck: Normal range of motion. Neck supple. No tracheal deviation present. No thyroid mass present.  Cardiovascular: Normal rate, regular rhythm and normal heart sounds.  Exam reveals no gallop.   No murmur heard. Pulmonary/Chest: Effort normal. No stridor. No respiratory distress. He has decreased breath sounds. He has no wheezes. He has no rhonchi. He has no rales.  Abdominal: Soft. Normal appearance and bowel sounds are normal. He exhibits no distension. There is no tenderness. There is no rebound and no CVA tenderness.   Musculoskeletal: Normal range of motion. He exhibits no edema or tenderness.  Neurological: He is alert and oriented to person, place, and time. He has normal strength. No cranial nerve deficit or sensory deficit. GCS eye subscore is 4. GCS verbal subscore is 5. GCS motor subscore is 6.  Skin: Skin is warm and dry. No abrasion and no rash noted.  Psychiatric: He has a normal mood and affect. His speech is normal and behavior is normal.  Nursing note and vitals reviewed.   ED Course  Procedures (including critical care time) Labs Review Labs Reviewed  URINE CULTURE  URINALYSIS, ROUTINE W REFLEX MICROSCOPIC (NOT AT [facility])  CBC WITH DIFFERENTIAL/PLATELET  COMPREHENSIVE METABOLIC PANEL  INFLUENZA PANEL BY PCR (TYPE A & B, H1N1)  I-STAT CG4 LACTIC ACID, ED    Imaging Review No results found. I have personally reviewed and evaluated these images and lab results as part of my medical decision-making.   EKG Interpretation None      MDM   Final diagnoses:  SOB (shortness of breath)    Patient given IV fluids  and feels better. Patient's urinalysis with possible infection he was given Rocephin will be sent home on Keflex. His influenza test is pending at this time. His creatinine is elevated to 1.58 and he was instructed to follow-up with this.    Lacretia Leigh, MD 10/09/15 [phone]

## 2015-10-09 NOTE — ED Notes (Signed)
Patient transported to X-ray 

## 2015-10-09 NOTE — ED Notes (Signed)
Family states "he's been sick with cold sx's x 2 days."

## 2015-10-09 NOTE — ED Notes (Signed)
IV attempt x 2 RN's without success.  Requested Gennette Pac RN attempt [place].

## 2015-10-09 NOTE — Discharge Instructions (Signed)

## 2015-10-09 NOTE — ED Notes (Signed)
Pt cannot use restroom at this time, aware urine specimen is needed.  

## 2015-10-09 NOTE — ED Notes (Addendum)
Family member states pt was coughing all night. Recent temp of 101.3, given Aleve prior to arrival. Pt states productive thick white mucus with cough. C/o weakness, SOB, nausea, and generalized body aches. Diminished, possible rhonchi auscultated in right lung lobes.

## 2015-10-11 LAB — URINE CULTURE

## 2016-01-18 DIAGNOSIS — R627 Adult failure to thrive: Secondary | ICD-10-CM | POA: Diagnosis not present

## 2016-01-18 DIAGNOSIS — D692 Other nonthrombocytopenic purpura: Secondary | ICD-10-CM | POA: Diagnosis not present

## 2016-01-18 DIAGNOSIS — I878 Other specified disorders of veins: Secondary | ICD-10-CM | POA: Diagnosis not present

## 2016-01-18 DIAGNOSIS — Z6832 Body mass index (BMI) 32.0-32.9, adult: Secondary | ICD-10-CM | POA: Diagnosis not present

## 2016-01-18 DIAGNOSIS — M5136 Other intervertebral disc degeneration, lumbar region: Secondary | ICD-10-CM | POA: Diagnosis not present

## 2016-01-18 DIAGNOSIS — E291 Testicular hypofunction: Secondary | ICD-10-CM | POA: Diagnosis not present

## 2016-01-18 DIAGNOSIS — E538 Deficiency of other specified B group vitamins: Secondary | ICD-10-CM | POA: Diagnosis not present

## 2016-01-18 DIAGNOSIS — M4316 Spondylolisthesis, lumbar region: Secondary | ICD-10-CM | POA: Diagnosis not present

## 2016-01-18 DIAGNOSIS — F325 Major depressive disorder, single episode, in full remission: Secondary | ICD-10-CM | POA: Diagnosis not present

## 2016-01-18 DIAGNOSIS — I129 Hypertensive chronic kidney disease with stage 1 through stage 4 chronic kidney disease, or unspecified chronic kidney disease: Secondary | ICD-10-CM | POA: Diagnosis not present

## 2016-01-18 DIAGNOSIS — N289 Disorder of kidney and ureter, unspecified: Secondary | ICD-10-CM | POA: Diagnosis not present

## 2016-01-18 DIAGNOSIS — R2689 Other abnormalities of gait and mobility: Secondary | ICD-10-CM | POA: Diagnosis not present

## 2016-01-18 DIAGNOSIS — M47816 Spondylosis without myelopathy or radiculopathy, lumbar region: Secondary | ICD-10-CM | POA: Diagnosis not present

## 2016-01-18 DIAGNOSIS — M1612 Unilateral primary osteoarthritis, left hip: Secondary | ICD-10-CM | POA: Diagnosis not present

## 2016-01-18 DIAGNOSIS — M859 Disorder of bone density and structure, unspecified: Secondary | ICD-10-CM | POA: Diagnosis not present

## 2016-01-18 DIAGNOSIS — N183 Chronic kidney disease, stage 3 (moderate): Secondary | ICD-10-CM | POA: Diagnosis not present

## 2016-03-14 DIAGNOSIS — N135 Crossing vessel and stricture of ureter without hydronephrosis: Secondary | ICD-10-CM | POA: Diagnosis not present

## 2016-03-14 DIAGNOSIS — N3941 Urge incontinence: Secondary | ICD-10-CM | POA: Diagnosis not present

## 2016-03-28 ENCOUNTER — Other Ambulatory Visit: Payer: Self-pay | Admitting: Urology

## 2016-04-12 DIAGNOSIS — H6123 Impacted cerumen, bilateral: Secondary | ICD-10-CM | POA: Diagnosis not present

## 2016-04-20 ENCOUNTER — Encounter (HOSPITAL_COMMUNITY)
Admission: RE | Admit: 2016-04-20 | Discharge: 2016-04-20 | Disposition: A | Discharge status: Home / Self Care | Payer: Medicare Other | Source: Ambulatory Visit | Attending: Urology | Admitting: Urology

## 2016-04-20 ENCOUNTER — Encounter (HOSPITAL_COMMUNITY): Payer: Self-pay

## 2016-04-20 DIAGNOSIS — Z01812 Encounter for preprocedural laboratory examination: Secondary | ICD-10-CM | POA: Insufficient documentation

## 2016-04-20 DIAGNOSIS — Z960 Presence of urogenital implants: Secondary | ICD-10-CM | POA: Insufficient documentation

## 2016-04-20 DIAGNOSIS — N135 Crossing vessel and stricture of ureter without hydronephrosis: Secondary | ICD-10-CM | POA: Diagnosis not present

## 2016-04-20 HISTORY — DX: Personal history of other medical treatment: Z92.89

## 2016-04-20 HISTORY — DX: Localized edema: R60.0

## 2016-04-20 HISTORY — DX: Benign prostatic hyperplasia without lower urinary tract symptoms: N40.0

## 2016-04-20 LAB — CBC
HCT: 42.5 % (ref 39.0–52.0)
HEMOGLOBIN: 13.9 g/dL (ref 13.0–17.0)
MCH: 29.4 pg (ref 26.0–34.0)
MCHC: 32.7 g/dL (ref 30.0–36.0)
MCV: 89.9 fL (ref 78.0–100.0)
PLATELETS: 214 10*3/uL (ref 150–400)
RBC: 4.73 MIL/uL (ref 4.22–5.81)
RDW: 14.4 % (ref 11.5–15.5)
WBC: 8.8 10*3/uL (ref 4.0–10.5)

## 2016-04-20 LAB — BASIC METABOLIC PANEL
ANION GAP: 8 (ref 5–15)
BUN: 20 mg/dL (ref 6–20)
CHLORIDE: 102 mmol/L (ref 101–111)
CO2: 28 mmol/L (ref 22–32)
Calcium: 9.4 mg/dL (ref 8.9–10.3)
Creatinine, Ser: 1.54 mg/dL — ABNORMAL HIGH (ref 0.61–1.24)
GFR calc non Af Amer: 40 mL/min — ABNORMAL LOW (ref >60)
GFR, EST AFRICAN AMERICAN: 46 mL/min — AB (ref >60)
Glucose, Bld: 103 mg/dL — ABNORMAL HIGH (ref 65–99)
POTASSIUM: 4.8 mmol/L (ref 3.5–5.1)
SODIUM: 138 mmol/L (ref 135–145)

## 2016-04-20 NOTE — Pre-Procedure Instructions (Signed)
EKG 1'17 / CXR 2'17 Epic.

## 2016-04-20 NOTE — Patient Instructions (Signed)
Steven Cain  04/20/2016   Your procedure is scheduled on: 04-28-16  Report to [facility] Main  Entrance take [facility]  elevators to 3rd floor to  [place] at     0930 AM.  Call this number if you have problems the morning of surgery [phone]   Remember: ONLY 1 PERSON MAY GO WITH YOU TO SHORT STAY TO GET  READY MORNING OF [name].  Do not eat food or drink liquids :After Midnight.     Take these medicines the morning of surgery with A SIP OF WATER:  Paroxetine. Meclizine. DO NOT TAKE ANY DIABETIC MEDICATIONS DAY OF YOUR SURGERY                               You may not have any metal on your body including hair pins and              piercings  Do not wear jewelry, make-up, lotions, powders or perfumes, deodorant             Do not wear nail polish.  Do not shave  48 hours prior to surgery.              Men may shave face and neck.   Do not bring valuables to the hospital. [name].  Contacts, dentures or bridgework may not be worn into surgery.  Leave suitcase in the car. After surgery it may be brought to your room.     Patients discharged the day of surgery will not be allowed to drive home.  Name and phone number of your driver:[name] Faison, spouse [phone]   Special Instructions: N/A              Please read over the following fact sheets you were given: _____________________________________________________________________             [facility] - Preparing for Surgery Before surgery, you can play an important role.  Because skin is not sterile, your skin needs to be as free of germs as possible.  You can reduce the number of germs on your skin by washing with CHG (chlorahexidine gluconate) soap before surgery.  CHG is an antiseptic cleaner which kills germs and bonds with the skin to continue killing germs even after washing. Please DO NOT use if you have an allergy to CHG or  antibacterial soaps.  If your skin becomes reddened/irritated stop using the CHG and inform your nurse when you arrive at Short Stay. Do not shave (including legs and underarms) for at least 48 hours prior to the first CHG shower.  You may shave your face/neck. Please follow these instructions carefully:  1.  Shower with CHG Soap the night before surgery and the  morning of Surgery.  2.  If you choose to wash your hair, wash your hair first as usual with your  normal  shampoo.  3.  After you shampoo, rinse your hair and body thoroughly to remove the  shampoo.                           4.  Use CHG as you would any other liquid soap.  You can apply  chg directly  to the skin and wash                       Gently with a scrungie or clean washcloth.  5.  Apply the CHG Soap to your body ONLY FROM THE NECK DOWN.   Do not use on face/ open                           Wound or open sores. Avoid contact with eyes, ears mouth and genitals (private parts).                       Wash face,  Genitals (private parts) with your normal soap.             6.  Wash thoroughly, paying special attention to the area where your surgery  will be performed.  7.  Thoroughly rinse your body with warm water from the neck down.  8.  DO NOT shower/wash with your normal soap after using and rinsing off  the CHG Soap.                9.  Pat yourself dry with a clean towel.            10.  Wear clean pajamas.            11.  Place clean sheets on your bed the night of your first shower and do not  sleep with pets. Day of Surgery : Do not apply any lotions/deodorants the morning of surgery.  Please wear clean clothes to the hospital/surgery center.  FAILURE TO FOLLOW THESE INSTRUCTIONS MAY RESULT IN THE CANCELLATION OF YOUR SURGERY PATIENT SIGNATURE_________________________________  NURSE SIGNATURE__________________________________  ________________________________________________________________________

## 2016-04-24 DIAGNOSIS — Z23 Encounter for immunization: Secondary | ICD-10-CM | POA: Diagnosis not present

## 2016-04-28 ENCOUNTER — Ambulatory Visit (HOSPITAL_COMMUNITY): Payer: Medicare Other

## 2016-04-28 ENCOUNTER — Ambulatory Visit (HOSPITAL_COMMUNITY): Payer: Medicare Other | Admitting: Anesthesiology

## 2016-04-28 ENCOUNTER — Encounter (HOSPITAL_COMMUNITY): Payer: Self-pay | Admitting: *Deleted

## 2016-04-28 ENCOUNTER — Ambulatory Visit (HOSPITAL_COMMUNITY)
Admission: RE | Admit: 2016-04-28 | Discharge: 2016-04-28 | Disposition: A | Discharge status: Home / Self Care | Payer: Medicare Other | Source: Ambulatory Visit | Attending: Urology | Admitting: Urology

## 2016-04-28 ENCOUNTER — Encounter (HOSPITAL_COMMUNITY)
Admission: RE | Disposition: A | Discharge status: Home / Self Care | Payer: Self-pay | Source: Ambulatory Visit | Attending: Urology

## 2016-04-28 DIAGNOSIS — Z466 Encounter for fitting and adjustment of urinary device: Secondary | ICD-10-CM | POA: Diagnosis not present

## 2016-04-28 DIAGNOSIS — Z6832 Body mass index (BMI) 32.0-32.9, adult: Secondary | ICD-10-CM | POA: Diagnosis not present

## 2016-04-28 DIAGNOSIS — E669 Obesity, unspecified: Secondary | ICD-10-CM | POA: Diagnosis not present

## 2016-04-28 DIAGNOSIS — F17220 Nicotine dependence, chewing tobacco, uncomplicated: Secondary | ICD-10-CM | POA: Insufficient documentation

## 2016-04-28 DIAGNOSIS — Z7982 Long term (current) use of aspirin: Secondary | ICD-10-CM | POA: Diagnosis not present

## 2016-04-28 DIAGNOSIS — F329 Major depressive disorder, single episode, unspecified: Secondary | ICD-10-CM | POA: Insufficient documentation

## 2016-04-28 DIAGNOSIS — N131 Hydronephrosis with ureteral stricture, not elsewhere classified: Secondary | ICD-10-CM | POA: Diagnosis not present

## 2016-04-28 DIAGNOSIS — N135 Crossing vessel and stricture of ureter without hydronephrosis: Secondary | ICD-10-CM | POA: Diagnosis not present

## 2016-04-28 DIAGNOSIS — Z87440 Personal history of urinary (tract) infections: Secondary | ICD-10-CM | POA: Insufficient documentation

## 2016-04-28 DIAGNOSIS — Z419 Encounter for procedure for purposes other than remedying health state, unspecified: Secondary | ICD-10-CM

## 2016-04-28 DIAGNOSIS — R32 Unspecified urinary incontinence: Secondary | ICD-10-CM | POA: Diagnosis not present

## 2016-04-28 DIAGNOSIS — Z9079 Acquired absence of other genital organ(s): Secondary | ICD-10-CM | POA: Insufficient documentation

## 2016-04-28 DIAGNOSIS — F419 Anxiety disorder, unspecified: Secondary | ICD-10-CM | POA: Insufficient documentation

## 2016-04-28 DIAGNOSIS — I10 Essential (primary) hypertension: Secondary | ICD-10-CM | POA: Diagnosis not present

## 2016-04-28 DIAGNOSIS — N3941 Urge incontinence: Secondary | ICD-10-CM | POA: Diagnosis not present

## 2016-04-28 HISTORY — PX: CYSTOSCOPY W/ URETERAL STENT PLACEMENT: SHX1429

## 2016-04-28 SURGERY — CYSTOSCOPY, WITH RETROGRADE PYELOGRAM AND URETERAL STENT INSERTION
Anesthesia: Monitor Anesthesia Care | Laterality: Left

## 2016-04-28 MED ORDER — PROPOFOL 10 MG/ML IV BOLUS
INTRAVENOUS | Status: DC | PRN
  Administered 2016-04-28 (×3): 30 mg via INTRAVENOUS

## 2016-04-28 MED ORDER — FENTANYL CITRATE (PF) 100 MCG/2ML IJ SOLN
INTRAMUSCULAR | Status: DC | PRN
  Administered 2016-04-28: 25 ug via INTRAVENOUS

## 2016-04-28 MED ORDER — FENTANYL CITRATE (PF) 100 MCG/2ML IJ SOLN: 25.0000 ug | INTRAMUSCULAR | Status: DC | PRN

## 2016-04-28 MED ORDER — TRAMADOL HCL 50 MG PO TABS: 50.0000 mg | ORAL_TABLET | Freq: Four times a day (QID) | ORAL | 0 refills | Status: DC | PRN

## 2016-04-28 MED ORDER — ONDANSETRON HCL 4 MG/2ML IJ SOLN
INTRAMUSCULAR | Status: AC
  Filled 2016-04-28: qty 2

## 2016-04-28 MED ORDER — LIDOCAINE 2% (20 MG/ML) 5 ML SYRINGE
INTRAMUSCULAR | Status: AC
  Filled 2016-04-28: qty 5

## 2016-04-28 MED ORDER — SODIUM CHLORIDE 0.9 % IR SOLN
Status: DC | PRN
  Administered 2016-04-28: 3000 mL via INTRAVESICAL

## 2016-04-28 MED ORDER — METOCLOPRAMIDE HCL 5 MG/ML IJ SOLN: 10.0000 mg | Freq: Once | INTRAMUSCULAR | Status: DC | PRN

## 2016-04-28 MED ORDER — MEPERIDINE HCL 50 MG/ML IJ SOLN: 6.2500 mg | INTRAMUSCULAR | Status: DC | PRN

## 2016-04-28 MED ORDER — FENTANYL CITRATE (PF) 100 MCG/2ML IJ SOLN
INTRAMUSCULAR | Status: AC
  Filled 2016-04-28: qty 2

## 2016-04-28 MED ORDER — PROPOFOL 10 MG/ML IV BOLUS
INTRAVENOUS | Status: AC
  Filled 2016-04-28: qty 20

## 2016-04-28 MED ORDER — LIDOCAINE 2% (20 MG/ML) 5 ML SYRINGE
INTRAMUSCULAR | Status: DC | PRN
  Administered 2016-04-28: 60 mg via INTRAVENOUS

## 2016-04-28 MED ORDER — CEFAZOLIN SODIUM-DEXTROSE 2-4 GM/100ML-% IV SOLN
2.0000 g | INTRAVENOUS | Status: AC
  Administered 2016-04-28: 2 g via INTRAVENOUS

## 2016-04-28 MED ORDER — PROPOFOL 10 MG/ML IV BOLUS
INTRAVENOUS | Status: AC
Start: 2016-04-28 — End: 2016-04-28
  Filled 2016-04-28: qty 20

## 2016-04-28 MED ORDER — LACTATED RINGERS IV SOLN
INTRAVENOUS | Status: DC | PRN
  Administered 2016-04-28: 11:00:00 via INTRAVENOUS

## 2016-04-28 MED ORDER — CEFAZOLIN SODIUM-DEXTROSE 2-4 GM/100ML-% IV SOLN
INTRAVENOUS | Status: AC
  Filled 2016-04-28: qty 100

## 2016-04-28 MED ORDER — IOHEXOL 300 MG/ML  SOLN
INTRAMUSCULAR | Status: DC | PRN
  Administered 2016-04-28: 3 mL

## 2016-04-28 MED ORDER — PROPOFOL 500 MG/50ML IV EMUL
INTRAVENOUS | Status: DC | PRN
Start: 2016-04-28 — End: 2016-04-28
  Administered 2016-04-28: 125 ug/kg/min via INTRAVENOUS

## 2016-04-28 SURGICAL SUPPLY — 14 items
BAG URO CATCHER STRL LF (MISCELLANEOUS) ×3 IMPLANT
BASKET ZERO TIP NITINOL 2.4FR (BASKET) IMPLANT
BSKT STON RTRVL ZERO TP 2.4FR (BASKET)
CATH INTERMIT  6FR 70CM (CATHETERS) IMPLANT
CLOTH BEACON ORANGE TIMEOUT ST (SAFETY) ×3 IMPLANT
GLOVE BIOGEL M STRL SZ7.5 (GLOVE) ×3 IMPLANT
GOWN STRL REUS W/TWL LRG LVL3 (GOWN DISPOSABLE) ×6 IMPLANT
GUIDEWIRE ANG ZIPWIRE 038X150 (WIRE) IMPLANT
GUIDEWIRE STR DUAL SENSOR (WIRE) ×3 IMPLANT
MANIFOLD NEPTUNE II (INSTRUMENTS) ×3 IMPLANT
PACK CYSTO (CUSTOM PROCEDURE TRAY) ×3 IMPLANT
STENT CONTOUR 7FRX26X.038 (STENTS) ×2 IMPLANT
TUBING CONNECTING 10 (TUBING) ×2 IMPLANT
TUBING CONNECTING 10' (TUBING) ×1

## 2016-04-28 NOTE — Anesthesia Procedure Notes (Signed)
Procedure Name: MAC Date/Time: 04/28/2016 10:52 AM Performed by: Dione Booze Pre-anesthesia Checklist: Patient identified, Emergency Drugs available, Suction available and Patient being monitored Patient Re-evaluated:Patient Re-evaluated prior to inductionOxygen Delivery Method: Simple face mask Placement Confirmation: positive ETCO2

## 2016-04-28 NOTE — Brief Op Note (Signed)
04/28/2016  11:09 AM  PATIENT:  Steven Cain  82 y.o. male  PRE-OPERATIVE DIAGNOSIS:  LEFT URETRAL STRICTURE  POST-OPERATIVE DIAGNOSIS:  LEFT URETRAL STRICTURE  PROCEDURE:  Procedure(s): CYSTOSCOPY WITH RETROGRADE PYELOGRAM/URETERAL STENT EXCHANGE (Left)  SURGEON:  Surgeon(s) and Role:    * [name], MD - Primary  PHYSICIAN ASSISTANT:   ASSISTANTS: none   ANESTHESIA:   none  EBL:  Total I/O In: 300 [I.V.:300] Out: 0   BLOOD ADMINISTERED:none  DRAINS: none   LOCAL MEDICATIONS USED:  NONE  SPECIMEN:  Source of Specimen:  left ureteral stent  DISPOSITION OF SPECIMEN:  discard  COUNTS:  YES  TOURNIQUET:  * No tourniquets in log *  DICTATION: .Other Dictation: Dictation Number [id number]  PLAN OF CARE: Discharge to home after PACU  PATIENT DISPOSITION:  PACU - hemodynamically stable.   Delay start of Pharmacological VTE agent (>24hrs) due to surgical blood loss or risk of bleeding: not applicable

## 2016-04-28 NOTE — Anesthesia Preprocedure Evaluation (Signed)
Anesthesia Evaluation  Patient identified by MRN, date of birth, ID band Patient awake    Reviewed: Allergy & Precautions, NPO status , Patient's Chart, lab work & pertinent test results  History of Anesthesia Complications Negative for: history of anesthetic complications  Airway Mallampati: II  TM Distance: >3 FB Neck ROM: Full    Dental no notable dental hx. (+) Dental Advisory Given, Poor Dentition, Missing, Chipped   Pulmonary neg pulmonary ROS,    Pulmonary exam normal breath sounds clear to auscultation       Cardiovascular hypertension, Pt. on medications Normal cardiovascular exam Rhythm:Regular Rate:Normal     Neuro/Psych PSYCHIATRIC DISORDERS Anxiety Depression negative neurological ROS     GI/Hepatic Neg liver ROS, PUD,   Endo/Other  obesity  Renal/GU negative Renal ROS  negative genitourinary   Musculoskeletal  (+) Arthritis , Osteoarthritis,    Abdominal   Peds negative pediatric ROS (+)  Hematology negative hematology ROS (+)   Anesthesia Other Findings   Reproductive/Obstetrics negative OB ROS                             Anesthesia Physical  Anesthesia Plan  ASA: II  Anesthesia Plan: MAC   Post-op Pain Management:    Induction: Intravenous  Airway Management Planned:   Additional Equipment:   Intra-op Plan:   Post-operative Plan: Extubation in OR  Informed Consent: I have reviewed the patients History and Physical, chart, labs and discussed the procedure including the risks, benefits and alternatives for the proposed anesthesia with the patient or authorized representative who has indicated his/her understanding and acceptance.   Dental advisory given  Plan Discussed with: CRNA  Anesthesia Plan Comments:         Anesthesia Quick Evaluation

## 2016-04-28 NOTE — Transfer of Care (Signed)
Immediate Anesthesia Transfer of Care Note  Patient: Olegario Shearer  Procedure(s) Performed: Procedure(s): CYSTOSCOPY WITH RETROGRADE PYELOGRAM/URETERAL STENT EXCHANGE (Left)  Patient Location: PACU  Anesthesia Type:MAC  Level of Consciousness: sedated and patient cooperative  Airway & Oxygen Therapy: Patient Spontanous Breathing and Patient connected to face mask oxygen  Post-op Assessment: Report given to RN and Post -op Vital signs reviewed and stable  Post vital signs: Reviewed and stable  Last Vitals:  Vitals:   04/28/16 1009  BP: (!) 158/74  Pulse: 66  Resp: 18  Temp: 36.8 C    Last Pain:  Vitals:   04/28/16 1009  TempSrc: Oral      Patients Stated Pain Goal: 3 ([id number])  Complications: No apparent anesthesia complications

## 2016-04-28 NOTE — Anesthesia Postprocedure Evaluation (Signed)
Anesthesia Post Note  Patient: Steven Cain  Procedure(s) Performed: Procedure(s) (LRB): CYSTOSCOPY WITH RETROGRADE PYELOGRAM/URETERAL STENT EXCHANGE (Left)  Patient location during evaluation: PACU Anesthesia Type: MAC Level of consciousness: awake and alert Pain management: pain level controlled Vital Signs Assessment: post-procedure vital signs reviewed and stable Respiratory status: spontaneous breathing, nonlabored ventilation, respiratory function stable and patient connected to nasal cannula oxygen Cardiovascular status: stable and blood pressure returned to baseline Anesthetic complications: no    Last Vitals:  Vitals:   04/28/16 1207 04/28/16 1240  BP: (!) 170/77 (!) 176/77  Pulse: (!) 56 68  Resp: 14 16  Temp: 36.4 C     Last Pain:  Vitals:   04/28/16 1145  TempSrc:   PainSc: 0-No pain                 Montez Hageman

## 2016-04-28 NOTE — H&P (Signed)
Steven Cain is an 82 y.o. male.    Chief Complaint: Pre-op Left Ureteral Stent change  HPI:   1 - Obstructing Left Ureteral Stricture - worrisome soft tissue thickening of distal left ureter by CT and operative retrograde pyelogram 05/06/14. Now s/p stenting. No bladder lesion or additional lesions by imaging. He has h/o extensive smokeless tobacco exposure.    Recent Course:   05/2014 - Left Ureteroscopy / biopsy / brushing / washing / stent change (6x26)- path just mild atypia only, no frank carcinoma,   10/2014 - Repeat Left Ureteroscopy / Biopsy / stent exchange - path benign, no progression of stricture; 03/2015 Left stent change (7x26), no intraluminal filling defects  09/2015 - Stent change 7x26;    2 - Incontinence - Long h/o near total incontinence s/p TURP previously by Dr. Rosana Hoes. Manages with adult diapers x years.    PMH sig for Billroth II procedure R+Y, bilateral inguinal hernia repair with mesh, alcoholism (now very involved and compliant with AA) His PCP is Dr. Virgina Jock.    Today [name] is seen to proceed with left ureteral stent change. No interval fevers.    Past Medical History:  Diagnosis Date  . Arthritis    DJD  . Benign enlargement of prostate    frequent UTI, trim of prostate done , with some issues of incontinence now.  . Bronchitis    chronic- usually has phelgm often  . Depression   . Fluid retention in legs    occasional use of diuretic as needed  . GI bleed   . Hard of hearing    hear more on left side- no hearing aid  . History of transfusion   . Hypertension   . Perforated, severe stomach ulcer ([place])   . Spinal stenosis   . Transfusion history    none recent  . Ureteral stricture    CHRONIC    Past Surgical History:  Procedure Laterality Date  . ABDOMINAL SURGERY     bilroths tube '74  . COLONOSCOPY N/A 09/29/2014   Procedure: COLONOSCOPY;  Surgeon: Beryle Beams, MD;  Location: [facility];  Service: Endoscopy;  Laterality: N/A;   . CYSTOSCOPY W/ URETERAL STENT PLACEMENT Left 05/06/2014   Procedure: CYSTOSCOPY WITH RETROGRADE PYELOGRAM/URETERAL STENT PLACEMENT;  Surgeon: [name], MD;  Location: [facility];  Service: Urology;  Laterality: Left;  . CYSTOSCOPY W/ URETERAL STENT PLACEMENT Left 03/31/2015   Procedure: CYSTOSCOPY WITH LEFT RETROGRADE PYELOGRAM/URETERAL STENT EXCHANGE;  Surgeon: [name], MD;  Location: [facility];  Service: Urology;  Laterality: Left;  . CYSTOSCOPY W/ URETERAL STENT PLACEMENT Left 09/15/2015   Procedure: CYSTOSCOPY WITH LEFT RETROGRADE PYELOGRAM/URETERAL STENT EXCHANGE ;  Surgeon: [name], MD;  Location: [facility];  Service: Urology;  Laterality: Left;  . CYSTOSCOPY WITH RETROGRADE PYELOGRAM, URETEROSCOPY AND STENT PLACEMENT Left 10/21/2014   Procedure: CYSTOSCOPY WITH RETROGRADE PYELOGRAM, URETEROSCOPY,BIOPSY AND STENT CHANGE;  Surgeon: [name], MD;  Location: [facility];  Service: Urology;  Laterality: Left;  . CYSTOSCOPY/RETROGRADE/URETEROSCOPY Left 06/03/2014   Procedure: CYSTOSCOPY/RETROGRADE/URETEROSCOPY WITH BIOPSY,STENT EXCHANGE;  Surgeon: [name], MD;  Location: [facility];  Service: Urology;  Laterality: Left;  . ESOPHAGOGASTRODUODENOSCOPY N/A 09/28/2014   Procedure: ESOPHAGOGASTRODUODENOSCOPY (EGD);  Surgeon: Beryle Beams, MD;  Location: [facility];  Service: Endoscopy;  Laterality: N/A;  . GASTRIC ROUX-EN-Y     '74  . HERNIA REPAIR     bilateral hernia repairs   . TONSILLECTOMY    . TRANSURETHRAL RESECTION OF PROSTATE  No family history on file. Social History:  reports that he has never smoked. His smokeless tobacco use includes Chew. He reports that he does not drink alcohol or use drugs.  Allergies: No Known Allergies  No prescriptions prior to admission.    No results found for this or any previous visit (from the past 48 hour(s)). No results found.  Review of Systems  Constitutional: Negative.  Negative for chills and fever.  HENT: Negative.    Eyes: Negative.   Respiratory: Negative.   Cardiovascular: Negative.   Gastrointestinal: Negative.   Genitourinary: Negative.  Negative for flank pain.  Musculoskeletal: Negative.   Skin: Negative.   Neurological: Negative.   Endo/Heme/Allergies: Negative.   Psychiatric/Behavioral: Negative.     There were no vitals taken for this visit. Physical Exam  Constitutional: He appears well-developed.  HENT:  Head: Normocephalic.  Eyes: Pupils are equal, round, and reactive to light.  Neck: Normal range of motion.  Cardiovascular: Normal rate.   Respiratory: Effort normal.  GI: Soft.  Genitourinary: Penis normal.  Musculoskeletal: Normal range of motion.  Neurological: He is alert.  Skin: Skin is warm.  Psychiatric: He has a normal mood and affect.     Assessment/Plan  Proceed as planned with left ureteral stent change. Risks, benefits, alternatives discussed previously and reiterated today. He has done many times before and understands well peri-op course.   [name], MD 04/28/2016, 6:10 AM

## 2016-04-28 NOTE — Discharge Instructions (Signed)
1 - You may have urinary urgency (bladder spasms) and bloody urine on / off with stent in place. This is normal. ° °2 - Call MD or go to ER for fever >102, severe pain / nausea / vomiting not relieved by medications, or acute change in medical status ° °

## 2016-04-29 NOTE — Op Note (Signed)
Steven Cain, Steven Cain NO.:  [id number]  MEDICAL RECORD NO.:  [id number]  [name]                         FACILITY:  [facility]  PHYSICIAN:  [name], MD     DATE OF BIRTH:  1931-04-11  DATE OF PROCEDURE:04/28/2016                               OPERATIVE REPORT   PREOPERATIVE DIAGNOSIS:  Chronic left ureteral stricture.  PROCEDURE: 1. Cystoscopy with left retrograde pyelogram and interpretation. 2. Exchange of left ureteral stent, 7 x 26 Contour, no tether.  ESTIMATED BLOOD LOSS:  Nil.  NOTIFICATION:  None.  SPECIMEN:  In situ left ureteral stent for discard, inspected and intact.  FINDINGS: 1. Mild hydronephrosis without ureteronephrosis consistent with     persistence of chronic stricture. 2. Successful replacement of left ureteral stent, proximal in renal     pelvis, distal in urinary bladder.  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  This was nonmalignant.  It has been biopsied times several.  He has elected chronic management with stent changes q.6 months, which he has done very well with for several years. He is approximately 6 months post his most recent stent change and presents for this again today.  He has had no recent fevers.  Informed consent was obtained and placed in the medical record.  DESCRIPTION OF PROCEDURE:  The patient being, Steven Cain, verified. Procedure being left ureteral stent change was confirmed.  Procedure was carried out.  Time-out was performed.  Monitored anesthesia care and conscious sedation were administered.  The patient was placed into a low lithotomy position.  Sterile field was created by prepping and draping the patient's penis, perineum, proximal thighs using iodine x3. Cystourethroscopy was performed using a 21-French rigid cystoscope with offset lens.  Inspection of the anterior and posterior urethra were unremarkable.  There was  a prior transurethral resection of the prostate defect with wide open urinary channel.  The distal end of the left ureteral stent was seen in situ.  It was minimally encrusted.  It was grasped, brought to level of the urethral meatus through which a 0.038 Zip wire was advanced to level of the upper pole.  The stent was exchanged for an open-ended catheter and left retrograde pyelogram was obtained.  Left retrograde pyelogram demonstrated single left ureter with single- system left kidney.  There was mild hydronephrosis without ureteronephrosis consistent with a persistence of known chronic stricture.  A new 7 x 26 Contour-type stent was then placed using cystoscopic and fluoroscopic guidance.  Good proximal and distal deployment were noted.  Efflux of urine was seen around into the distal end of the stent.  Bladder was emptied per cystoscope.  Procedure then terminated.  The patient tolerated procedure well.  There were no immediate periprocedural complications.  The patient was taken to the postanesthesia care in stable condition.          ______________________________ [name], MD     TM/MEDQ  D:  04/28/2016  T:  04/29/2016  Job:  [id number]

## 2016-05-08 ENCOUNTER — Other Ambulatory Visit: Payer: Self-pay | Admitting: Orthopaedic Surgery

## 2016-05-08 ENCOUNTER — Other Ambulatory Visit: Payer: Self-pay | Admitting: Physician Assistant

## 2016-05-08 NOTE — Progress Notes (Signed)
Scheduling pre op visit- please add SURGICAL ORDERS IN EPIC  thanks

## 2016-05-09 ENCOUNTER — Other Ambulatory Visit: Payer: Self-pay | Admitting: Physician Assistant

## 2016-05-09 NOTE — Patient Instructions (Signed)
Steven Cain  05/09/2016   Your procedure is scheduled on: 05/19/2016    Report to [facility] Main  Entrance take [place]  elevators to 3rd floor to  [place] at    0930 AM.  Call this number if you have problems the morning of surgery [phone]   Remember: ONLY 1 PERSON MAY GO WITH YOU TO SHORT STAY TO GET  READY MORNING OF [place].  Do not eat food or drink liquids :After Midnight.     Take these medicines the morning of surgery with A SIP OF WATER: paxil  DO NOT TAKE ANY DIABETIC MEDICATIONS DAY OF YOUR SURGERY                               You may not have any metal on your body including hair pins and              piercings  Do not wear jewelry, , lotions, powders or perfumes, deodorant                      Men may shave face and neck.   Do not bring valuables to the hospital. [place].  Contacts, dentures or bridgework may not be worn into surgery.  Leave suitcase in the car. After surgery it may be brought to your room.        Special Instructions: N/A              Please read over the following fact sheets you were given: _____________________________________________________________________             [facility] - Preparing for Surgery Before surgery, you can play an important role.  Because skin is not sterile, your skin needs to be as free of germs as possible.  You can reduce the number of germs on your skin by washing with CHG (chlorahexidine gluconate) soap before surgery.  CHG is an antiseptic cleaner which kills germs and bonds with the skin to continue killing germs even after washing. Please DO NOT use if you have an allergy to CHG or antibacterial soaps.  If your skin becomes reddened/irritated stop using the CHG and inform your nurse when you arrive at Short Stay. Do not shave (including legs and underarms) for at least 48 hours prior to the first CHG shower.  You may  shave your face/neck. Please follow these instructions carefully:  1.  Shower with CHG Soap the night before surgery and the  morning of Surgery.  2.  If you choose to wash your hair, wash your hair first as usual with your  normal  shampoo.  3.  After you shampoo, rinse your hair and body thoroughly to remove the  shampoo.                           4.  Use CHG as you would any other liquid soap.  You can apply chg directly  to the skin and wash                       Gently with a scrungie or clean washcloth.  5.  Apply the  CHG Soap to your body ONLY FROM THE NECK DOWN.   Do not use on face/ open                           Wound or open sores. Avoid contact with eyes, ears mouth and genitals (private parts).                       Wash face,  Genitals (private parts) with your normal soap.             6.  Wash thoroughly, paying special attention to the area where your surgery  will be performed.  7.  Thoroughly rinse your body with warm water from the neck down.  8.  DO NOT shower/wash with your normal soap after using and rinsing off  the CHG Soap.                9.  Pat yourself dry with a clean towel.            10.  Wear clean pajamas.            11.  Place clean sheets on your bed the night of your first shower and do not  sleep with pets. Day of Surgery : Do not apply any lotions/deodorants the morning of surgery.  Please wear clean clothes to the hospital/surgery center.  FAILURE TO FOLLOW THESE INSTRUCTIONS MAY RESULT IN THE CANCELLATION OF YOUR SURGERY PATIENT SIGNATURE_________________________________  NURSE SIGNATURE__________________________________  ________________________________________________________________________   Steven Cain  An incentive spirometer is a tool that can help keep your lungs clear and active. This tool measures how well you are filling your lungs with each breath. Taking long deep breaths may help reverse or decrease the chance of developing  breathing (pulmonary) problems (especially infection) following:  A long period of time when you are unable to move or be active. BEFORE THE PROCEDURE   If the spirometer includes an indicator to show your best effort, your nurse or respiratory therapist will set it to a desired goal.  If possible, sit up straight or lean slightly forward. Try not to slouch.  Hold the incentive spirometer in an upright position. INSTRUCTIONS FOR USE  1. Sit on the edge of your bed if possible, or sit up as far as you can in bed or on a chair. 2. Hold the incentive spirometer in an upright position. 3. Breathe out normally. 4. Place the mouthpiece in your mouth and seal your lips tightly around it. 5. Breathe in slowly and as deeply as possible, raising the piston or the ball toward the top of the column. 6. Hold your breath for 3-5 seconds or for as long as possible. Allow the piston or ball to fall to the bottom of the column. 7. Remove the mouthpiece from your mouth and breathe out normally. 8. Rest for a few seconds and repeat Steps 1 through 7 at least 10 times every 1-2 hours when you are awake. Take your time and take a few normal breaths between deep breaths. 9. The spirometer may include an indicator to show your best effort. Use the indicator as a goal to work toward during each repetition. 10. After each set of 10 deep breaths, practice coughing to be sure your lungs are clear. If you have an incision (the cut made at the time of surgery), support your incision when coughing by placing a pillow or rolled up  towels firmly against it. Once you are able to get out of bed, walk around indoors and cough well. You may stop using the incentive spirometer when instructed by your caregiver.  RISKS AND COMPLICATIONS  Take your time so you do not get dizzy or light-headed.  If you are in pain, you may need to take or ask for pain medication before doing incentive spirometry. It is harder to take a deep  breath if you are having pain. AFTER USE  Rest and breathe slowly and easily.  It can be helpful to keep track of a log of your progress. Your caregiver can provide you with a simple table to help with this. If you are using the spirometer at home, follow these instructions: [place] IF:   You are having difficultly using the spirometer.  You have trouble using the spirometer as often as instructed.  Your pain medication is not giving enough relief while using the spirometer.  You develop fever of 100.5 F (38.1 C) or higher. SEEK IMMEDIATE MEDICAL CARE IF:   You cough up bloody sputum that had not been present before.  You develop fever of 102 F (38.9 C) or greater.  You develop worsening pain at or near the incision site. MAKE SURE YOU:   Understand these instructions.  Will watch your condition.  Will get help right away if you are not doing well or get worse. Document Released: 12/11/2006 Document Revised: 10/23/2011 Document Reviewed: 02/11/2007 [facility] Patient Information 2014 [place], [place].   ________________________________________________________________________

## 2016-05-10 ENCOUNTER — Encounter (HOSPITAL_COMMUNITY)
Admission: RE | Admit: 2016-05-10 | Discharge: 2016-05-10 | Disposition: A | Discharge status: Home / Self Care | Payer: Medicare Other | Source: Ambulatory Visit | Attending: Orthopaedic Surgery | Admitting: Orthopaedic Surgery

## 2016-05-10 ENCOUNTER — Encounter (HOSPITAL_COMMUNITY): Payer: Self-pay

## 2016-05-10 DIAGNOSIS — M1612 Unilateral primary osteoarthritis, left hip: Secondary | ICD-10-CM | POA: Diagnosis not present

## 2016-05-10 DIAGNOSIS — Z01812 Encounter for preprocedural laboratory examination: Secondary | ICD-10-CM | POA: Diagnosis not present

## 2016-05-10 LAB — BASIC METABOLIC PANEL
ANION GAP: 10 (ref 5–15)
BUN: 24 mg/dL — ABNORMAL HIGH (ref 6–20)
CALCIUM: 9.3 mg/dL (ref 8.9–10.3)
CO2: 26 mmol/L (ref 22–32)
Chloride: 97 mmol/L — ABNORMAL LOW (ref 101–111)
Creatinine, Ser: 1.69 mg/dL — ABNORMAL HIGH (ref 0.61–1.24)
GFR, EST AFRICAN AMERICAN: 41 mL/min — AB (ref >60)
GFR, EST NON AFRICAN AMERICAN: 36 mL/min — AB (ref >60)
Glucose, Bld: 117 mg/dL — ABNORMAL HIGH (ref 65–99)
Potassium: 4.8 mmol/L (ref 3.5–5.1)
SODIUM: 133 mmol/L — AB (ref 135–145)

## 2016-05-10 LAB — CBC
HCT: 41.8 % (ref 39.0–52.0)
HEMOGLOBIN: 14.4 g/dL (ref 13.0–17.0)
MCH: 29.9 pg (ref 26.0–34.0)
MCHC: 34.4 g/dL (ref 30.0–36.0)
MCV: 86.7 fL (ref 78.0–100.0)
PLATELETS: 218 10*3/uL (ref 150–400)
RBC: 4.82 MIL/uL (ref 4.22–5.81)
RDW: 13.9 % (ref 11.5–15.5)
WBC: 10.1 10*3/uL (ref 4.0–10.5)

## 2016-05-10 LAB — SURGICAL PCR SCREEN
MRSA, PCR: NEGATIVE
Staphylococcus aureus: NEGATIVE

## 2016-05-10 NOTE — Progress Notes (Signed)
BMP done 05/10/16 faxed via EPIC to Dr Zollie Beckers.

## 2016-05-10 NOTE — Progress Notes (Signed)
EKG-09/07/15- EPIC  CXR- 10/09/2015- EPIC

## 2016-05-15 NOTE — Progress Notes (Signed)
pts wife aware of surgical time for scheduled surgery on 05/19/2016; aware to arrive at [facility] short stay at 10:15 am; no food or drink after midnight.

## 2016-05-19 ENCOUNTER — Inpatient Hospital Stay (HOSPITAL_COMMUNITY)
Admission: RE | Admit: 2016-05-19 | Discharge: 2016-05-22 | DRG: 470 | Disposition: A | Discharge status: Home Health | Payer: Medicare Other | Source: Ambulatory Visit | Attending: Orthopaedic Surgery | Admitting: Orthopaedic Surgery

## 2016-05-19 ENCOUNTER — Inpatient Hospital Stay (HOSPITAL_COMMUNITY): Payer: Medicare Other | Admitting: Certified Registered"

## 2016-05-19 ENCOUNTER — Encounter (HOSPITAL_COMMUNITY)
Admission: RE | Disposition: A | Discharge status: Home Health | Payer: Self-pay | Source: Ambulatory Visit | Attending: Orthopaedic Surgery

## 2016-05-19 ENCOUNTER — Encounter (HOSPITAL_COMMUNITY): Payer: Self-pay | Admitting: *Deleted

## 2016-05-19 ENCOUNTER — Inpatient Hospital Stay (HOSPITAL_COMMUNITY): Payer: Medicare Other

## 2016-05-19 DIAGNOSIS — H9190 Unspecified hearing loss, unspecified ear: Secondary | ICD-10-CM | POA: Diagnosis present

## 2016-05-19 DIAGNOSIS — Z96642 Presence of left artificial hip joint: Secondary | ICD-10-CM

## 2016-05-19 DIAGNOSIS — J42 Unspecified chronic bronchitis: Secondary | ICD-10-CM | POA: Diagnosis present

## 2016-05-19 DIAGNOSIS — Z471 Aftercare following joint replacement surgery: Secondary | ICD-10-CM | POA: Diagnosis not present

## 2016-05-19 DIAGNOSIS — N183 Chronic kidney disease, stage 3 (moderate): Secondary | ICD-10-CM | POA: Diagnosis present

## 2016-05-19 DIAGNOSIS — Z87440 Personal history of urinary (tract) infections: Secondary | ICD-10-CM | POA: Diagnosis not present

## 2016-05-19 DIAGNOSIS — Z8711 Personal history of peptic ulcer disease: Secondary | ICD-10-CM | POA: Diagnosis not present

## 2016-05-19 DIAGNOSIS — Z87891 Personal history of nicotine dependence: Secondary | ICD-10-CM | POA: Diagnosis not present

## 2016-05-19 DIAGNOSIS — I129 Hypertensive chronic kidney disease with stage 1 through stage 4 chronic kidney disease, or unspecified chronic kidney disease: Secondary | ICD-10-CM | POA: Diagnosis present

## 2016-05-19 DIAGNOSIS — N400 Enlarged prostate without lower urinary tract symptoms: Secondary | ICD-10-CM | POA: Diagnosis present

## 2016-05-19 DIAGNOSIS — M1612 Unilateral primary osteoarthritis, left hip: Secondary | ICD-10-CM | POA: Diagnosis not present

## 2016-05-19 DIAGNOSIS — Z419 Encounter for procedure for purposes other than remedying health state, unspecified: Secondary | ICD-10-CM

## 2016-05-19 HISTORY — PX: TOTAL HIP ARTHROPLASTY: SHX124

## 2016-05-19 LAB — TYPE AND SCREEN
ABO/RH(D): A POS
ANTIBODY SCREEN: NEGATIVE

## 2016-05-19 SURGERY — ARTHROPLASTY, HIP, TOTAL, ANTERIOR APPROACH
Anesthesia: Monitor Anesthesia Care | Site: Hip | Laterality: Left

## 2016-05-19 MED ORDER — CEFAZOLIN SODIUM-DEXTROSE 2-4 GM/100ML-% IV SOLN
2.0000 g | INTRAVENOUS | Status: AC
  Administered 2016-05-19: 2 g via INTRAVENOUS

## 2016-05-19 MED ORDER — ALUM & MAG HYDROXIDE-SIMETH 200-200-20 MG/5ML PO SUSP: 30.0000 mL | ORAL | Status: DC | PRN

## 2016-05-19 MED ORDER — POLYVINYL ALCOHOL 1.4 % OP SOLN
1.0000 [drp] | OPHTHALMIC | Status: DC | PRN
  Filled 2016-05-19: qty 15

## 2016-05-19 MED ORDER — SODIUM CHLORIDE 0.9 % IV SOLN
INTRAVENOUS | Status: DC
  Administered 2016-05-19: 18:00:00 via INTRAVENOUS

## 2016-05-19 MED ORDER — LIDOCAINE 2% (20 MG/ML) 5 ML SYRINGE
INTRAMUSCULAR | Status: AC
  Filled 2016-05-19: qty 5

## 2016-05-19 MED ORDER — PROPOFOL 10 MG/ML IV BOLUS
INTRAVENOUS | Status: AC
  Filled 2016-05-19: qty 20

## 2016-05-19 MED ORDER — ONDANSETRON HCL 4 MG PO TABS: 4.0000 mg | ORAL_TABLET | Freq: Four times a day (QID) | ORAL | Status: DC | PRN

## 2016-05-19 MED ORDER — PHENOL 1.4 % MT LIQD: 1.0000 | OROMUCOSAL | Status: DC | PRN

## 2016-05-19 MED ORDER — 0.9 % SODIUM CHLORIDE (POUR BTL) OPTIME
TOPICAL | Status: DC | PRN
  Administered 2016-05-19: 1000 mL

## 2016-05-19 MED ORDER — TRANEXAMIC ACID 1000 MG/10ML IV SOLN
1000.0000 mg | INTRAVENOUS | Status: DC
  Filled 2016-05-19: qty 10

## 2016-05-19 MED ORDER — LACTATED RINGERS IV SOLN
INTRAVENOUS | Status: DC
  Administered 2016-05-19: 14:00:00 via INTRAVENOUS
  Administered 2016-05-19: 1000 mL via INTRAVENOUS

## 2016-05-19 MED ORDER — DM-GUAIFENESIN ER 30-600 MG PO TB12
1.0000 | ORAL_TABLET | Freq: Two times a day (BID) | ORAL | Status: DC
  Administered 2016-05-19 – 2016-05-21 (×5): 1 via ORAL
  Filled 2016-05-19 (×6): qty 1

## 2016-05-19 MED ORDER — DEXAMETHASONE SODIUM PHOSPHATE 10 MG/ML IJ SOLN
INTRAMUSCULAR | Status: DC | PRN
  Administered 2016-05-19: 10 mg via INTRAVENOUS

## 2016-05-19 MED ORDER — SUGAMMADEX SODIUM 200 MG/2ML IV SOLN
INTRAVENOUS | Status: AC
  Filled 2016-05-19: qty 2

## 2016-05-19 MED ORDER — METHOCARBAMOL 500 MG PO TABS: 500.0000 mg | ORAL_TABLET | Freq: Four times a day (QID) | ORAL | Status: DC | PRN

## 2016-05-19 MED ORDER — TRANEXAMIC ACID 1000 MG/10ML IV SOLN
INTRAVENOUS | Status: DC | PRN
  Administered 2016-05-19: 1000 mg via INTRAVENOUS

## 2016-05-19 MED ORDER — HYDROMORPHONE HCL 1 MG/ML IJ SOLN: 1.0000 mg | INTRAMUSCULAR | Status: DC | PRN

## 2016-05-19 MED ORDER — HYDROMORPHONE HCL 1 MG/ML IJ SOLN: 0.2500 mg | INTRAMUSCULAR | Status: DC | PRN

## 2016-05-19 MED ORDER — ONDANSETRON HCL 4 MG/2ML IJ SOLN
INTRAMUSCULAR | Status: AC
  Filled 2016-05-19: qty 2

## 2016-05-19 MED ORDER — BUPIVACAINE IN DEXTROSE 0.75-8.25 % IT SOLN
INTRATHECAL | Status: DC | PRN
  Administered 2016-05-19: 2 mL via INTRATHECAL

## 2016-05-19 MED ORDER — OXYCODONE HCL 5 MG PO TABS
5.0000 mg | ORAL_TABLET | ORAL | Status: DC | PRN
Start: 2016-05-19 — End: 2016-05-22

## 2016-05-19 MED ORDER — PAROXETINE HCL 20 MG PO TABS
20.0000 mg | ORAL_TABLET | Freq: Every day | ORAL | Status: DC
  Administered 2016-05-19 – 2016-05-22 (×14): 20 mg via ORAL
  Filled 2016-05-19 (×24): qty 1

## 2016-05-19 MED ORDER — COENZYME Q10 30 MG PO CAPS: 30.0000 mg | ORAL_CAPSULE | ORAL | Status: DC

## 2016-05-19 MED ORDER — ACETAMINOPHEN 650 MG RE SUPP: 650.0000 mg | Freq: Four times a day (QID) | RECTAL | Status: DC | PRN

## 2016-05-19 MED ORDER — PROMETHAZINE HCL 25 MG/ML IJ SOLN: 6.2500 mg | INTRAMUSCULAR | Status: DC | PRN

## 2016-05-19 MED ORDER — DEXAMETHASONE SODIUM PHOSPHATE 10 MG/ML IJ SOLN
INTRAMUSCULAR | Status: AC
  Filled 2016-05-19: qty 1

## 2016-05-19 MED ORDER — HYDROMORPHONE HCL 1 MG/ML IJ SOLN
0.2500 mg | INTRAMUSCULAR | Status: DC | PRN
  Administered 2016-05-19: 0.5 mg via INTRAVENOUS

## 2016-05-19 MED ORDER — FENTANYL CITRATE (PF) 100 MCG/2ML IJ SOLN
INTRAMUSCULAR | Status: AC
  Filled 2016-05-19: qty 2

## 2016-05-19 MED ORDER — VITAMIN E 45 MG (100 UNIT) PO CAPS
100.0000 [IU] | ORAL_CAPSULE | Freq: Every day | ORAL | Status: DC
  Administered 2016-05-20 – 2016-05-22 (×3): 100 [IU] via ORAL
  Filled 2016-05-19 (×3): qty 1

## 2016-05-19 MED ORDER — STERILE WATER FOR IRRIGATION IR SOLN
Status: DC | PRN
  Administered 2016-05-19: 2000 mL

## 2016-05-19 MED ORDER — VITAMIN C 500 MG PO TABS
1000.0000 mg | ORAL_TABLET | Freq: Every day | ORAL | Status: DC
Start: 2016-05-19 — End: 2016-05-22
  Administered 2016-05-20 – 2016-05-22 (×3): 1000 mg via ORAL
  Filled 2016-05-19 (×3): qty 2

## 2016-05-19 MED ORDER — ACETAMINOPHEN 325 MG PO TABS
650.0000 mg | ORAL_TABLET | Freq: Four times a day (QID) | ORAL | Status: DC | PRN
  Administered 2016-05-19: 650 mg via ORAL
  Filled 2016-05-19: qty 2

## 2016-05-19 MED ORDER — MENTHOL 3 MG MT LOZG: 1.0000 | LOZENGE | OROMUCOSAL | Status: DC | PRN

## 2016-05-19 MED ORDER — TAMSULOSIN HCL 0.4 MG PO CAPS
0.4000 mg | ORAL_CAPSULE | Freq: Every day | ORAL | Status: DC
  Administered 2016-05-19 – 2016-05-21 (×3): 0.4 mg via ORAL
  Filled 2016-05-19 (×3): qty 1

## 2016-05-19 MED ORDER — DIPHENHYDRAMINE HCL 12.5 MG/5ML PO ELIX
12.5000 mg | ORAL_SOLUTION | ORAL | Status: DC | PRN
  Administered 2016-05-19: 25 mg via ORAL
  Filled 2016-05-19: qty 10

## 2016-05-19 MED ORDER — CHLORHEXIDINE GLUCONATE 4 % EX LIQD: 60.0000 mL | Freq: Once | CUTANEOUS | Status: DC

## 2016-05-19 MED ORDER — HYDROCHLOROTHIAZIDE 25 MG PO TABS
12.5000 mg | ORAL_TABLET | ORAL | Status: DC
  Administered 2016-05-20 – 2016-05-22 (×2): 12.5 mg via ORAL
  Filled 2016-05-19 (×2): qty 1

## 2016-05-19 MED ORDER — MECLIZINE HCL 25 MG PO TABS
25.0000 mg | ORAL_TABLET | Freq: Two times a day (BID) | ORAL | Status: DC | PRN
  Filled 2016-05-19: qty 1

## 2016-05-19 MED ORDER — DOCUSATE SODIUM 100 MG PO CAPS
100.0000 mg | ORAL_CAPSULE | Freq: Two times a day (BID) | ORAL | Status: DC
  Administered 2016-05-19 – 2016-05-22 (×5): 100 mg via ORAL
  Filled 2016-05-19 (×6): qty 1

## 2016-05-19 MED ORDER — ADULT MULTIVITAMIN W/MINERALS CH
1.0000 | ORAL_TABLET | Freq: Every day | ORAL | Status: DC
  Administered 2016-05-20 – 2016-05-22 (×3): 1 via ORAL
  Filled 2016-05-19 (×3): qty 1

## 2016-05-19 MED ORDER — SODIUM CHLORIDE 0.9 % IR SOLN
Status: DC | PRN
  Administered 2016-05-19: 2000 mL

## 2016-05-19 MED ORDER — HYDROMORPHONE HCL 1 MG/ML IJ SOLN
INTRAMUSCULAR | Status: AC
  Filled 2016-05-19: qty 1

## 2016-05-19 MED ORDER — METOCLOPRAMIDE HCL 5 MG PO TABS: 5.0000 mg | ORAL_TABLET | Freq: Three times a day (TID) | ORAL | Status: DC | PRN

## 2016-05-19 MED ORDER — METOCLOPRAMIDE HCL 5 MG/ML IJ SOLN: 5.0000 mg | Freq: Three times a day (TID) | INTRAMUSCULAR | Status: DC | PRN

## 2016-05-19 MED ORDER — PROPOFOL 500 MG/50ML IV EMUL
INTRAVENOUS | Status: DC | PRN
  Administered 2016-05-19: 75 ug/kg/min via INTRAVENOUS

## 2016-05-19 MED ORDER — CEFAZOLIN SODIUM-DEXTROSE 2-4 GM/100ML-% IV SOLN
INTRAVENOUS | Status: AC
  Filled 2016-05-19: qty 100

## 2016-05-19 MED ORDER — ASPIRIN EC 325 MG PO TBEC
325.0000 mg | DELAYED_RELEASE_TABLET | Freq: Two times a day (BID) | ORAL | Status: DC
  Administered 2016-05-20 – 2016-05-22 (×5): 325 mg via ORAL
  Filled 2016-05-19 (×5): qty 1

## 2016-05-19 MED ORDER — ONDANSETRON HCL 4 MG/2ML IJ SOLN
INTRAMUSCULAR | Status: DC | PRN
Start: 2016-05-19 — End: 2016-05-19
  Administered 2016-05-19: 4 mg via INTRAVENOUS

## 2016-05-19 MED ORDER — ONDANSETRON HCL 4 MG/2ML IJ SOLN: 4.0000 mg | Freq: Four times a day (QID) | INTRAMUSCULAR | Status: DC | PRN

## 2016-05-19 MED ORDER — CEFAZOLIN IN D5W 1 GM/50ML IV SOLN
1.0000 g | Freq: Four times a day (QID) | INTRAVENOUS | Status: AC
  Administered 2016-05-19 (×2): 1 g via INTRAVENOUS
  Filled 2016-05-19 (×2): qty 50

## 2016-05-19 MED ORDER — FENTANYL CITRATE (PF) 100 MCG/2ML IJ SOLN
INTRAMUSCULAR | Status: DC | PRN
  Administered 2016-05-19 (×2): 50 ug via INTRAVENOUS

## 2016-05-19 MED ORDER — METHOCARBAMOL 1000 MG/10ML IJ SOLN
500.0000 mg | Freq: Four times a day (QID) | INTRAVENOUS | Status: DC | PRN
  Administered 2016-05-19: 500 mg via INTRAVENOUS
  Filled 2016-05-19: qty 5
  Filled 2016-05-19: qty 550

## 2016-05-19 SURGICAL SUPPLY — 44 items
BAG SPEC THK2 15X12 ZIP CLS (MISCELLANEOUS) ×1
BAG ZIPLOCK 12X15 (MISCELLANEOUS) ×2 IMPLANT
BLADE SAW SGTL 18X1.27X75 (BLADE) ×2 IMPLANT
BLADE SAW SGTL 18X1.27X75MM (BLADE) ×1
CAPT HIP TOTAL 2 ×2 IMPLANT
CATH FOLEY 2WAY  3CC  8FR (CATHETERS)
CATH FOLEY 2WAY 3CC 8FR (CATHETERS) IMPLANT
CELLS DAT CNTRL 66122 CELL SVR (MISCELLANEOUS) ×1 IMPLANT
CLOTH BEACON ORANGE TIMEOUT ST (SAFETY) ×3 IMPLANT
DRAPE STERI IOBAN 125X83 (DRAPES) ×3 IMPLANT
DRAPE U-SHAPE 47X51 STRL (DRAPES) ×6 IMPLANT
DRESSING AQUACEL AG SP 3.5X10 (GAUZE/BANDAGES/DRESSINGS) ×1 IMPLANT
DRSG AQUACEL AG SP 3.5X10 (GAUZE/BANDAGES/DRESSINGS) ×3
DURAPREP 26ML APPLICATOR (WOUND CARE) ×3 IMPLANT
ELECT REM PT RETURN 9FT ADLT (ELECTROSURGICAL) ×3
ELECTRODE REM PT RTRN 9FT ADLT (ELECTROSURGICAL) ×1 IMPLANT
GAUZE XEROFORM 1X8 LF (GAUZE/BANDAGES/DRESSINGS) ×2 IMPLANT
GLOVE BIO SURGEON STRL SZ7.5 (GLOVE) ×3 IMPLANT
GLOVE BIOGEL PI IND STRL 6.5 (GLOVE) IMPLANT
GLOVE BIOGEL PI IND STRL 7.5 (GLOVE) IMPLANT
GLOVE BIOGEL PI IND STRL 8 (GLOVE) ×2 IMPLANT
GLOVE BIOGEL PI INDICATOR 6.5 (GLOVE) ×2
GLOVE BIOGEL PI INDICATOR 7.5 (GLOVE) ×6
GLOVE BIOGEL PI INDICATOR 8 (GLOVE) ×4
GLOVE ECLIPSE 8.0 STRL XLNG CF (GLOVE) ×3 IMPLANT
GLOVE ORTHOPEDIC STR SZ6.5 (GLOVE) ×2 IMPLANT
GLOVE SURG SS PI 7.5 STRL IVOR (GLOVE) ×2 IMPLANT
GOWN SPEC L3 XXLG W/TWL (GOWN DISPOSABLE) ×2 IMPLANT
GOWN STRL REUS W/TWL XL LVL3 (GOWN DISPOSABLE) ×8 IMPLANT
HANDPIECE INTERPULSE COAX TIP (DISPOSABLE) ×3
HOLDER FOLEY CATH W/STRAP (MISCELLANEOUS) ×3 IMPLANT
PACK ANTERIOR HIP CUSTOM (KITS) ×3 IMPLANT
RETRACTOR WND ALEXIS 18 MED (MISCELLANEOUS) ×1 IMPLANT
RTRCTR WOUND ALEXIS 18CM MED (MISCELLANEOUS) ×3
SET HNDPC FAN SPRY TIP SCT (DISPOSABLE) ×1 IMPLANT
STAPLER VISISTAT 35W (STAPLE) ×3 IMPLANT
SUT ETHIBOND NAB CT1 #1 30IN (SUTURE) ×3 IMPLANT
SUT MNCRL AB 4-0 PS2 18 (SUTURE) IMPLANT
SUT VIC AB 0 CT1 36 (SUTURE) ×3 IMPLANT
SUT VIC AB 1 CT1 36 (SUTURE) ×3 IMPLANT
SUT VIC AB 2-0 CT1 27 (SUTURE) ×6
SUT VIC AB 2-0 CT1 TAPERPNT 27 (SUTURE) ×2 IMPLANT
TRAY FOLEY W/METER SILVER 16FR (SET/KITS/TRAYS/PACK) ×1 IMPLANT
YANKAUER SUCT BULB TIP NO VENT (SUCTIONS) ×3 IMPLANT

## 2016-05-19 NOTE — H&P (Signed)
TOTAL HIP ADMISSION H&P  Patient is admitted for left total hip arthroplasty.  Subjective:  Chief Complaint: left hip pain  HPI: Steven Cain, 82 y.o. male, has a history of pain and functional disability in the left hip(s) due to arthritis and patient has failed non-surgical conservative treatments for greater than 12 weeks to include NSAID's and/or analgesics, flexibility and strengthening excercises, supervised PT with diminished ADL's post treatment, use of assistive devices and activity modification.  Onset of symptoms was gradual starting >10 years ago with gradually worsening course since that time.The patient noted no past surgery on the left hip(s).  Patient currently rates pain in the left hip at 10 out of 10 with activity. Patient has night pain, worsening of pain with activity and weight bearing, trendelenberg gait, pain that interfers with activities of daily living and pain with passive range of motion. Patient has evidence of subchondral cysts, subchondral sclerosis, periarticular osteophytes, joint subluxation and joint space narrowing by imaging studies. This condition presents safety issues increasing the risk of falls.  There is no current active infection.  Patient Active Problem List   Diagnosis Date Noted  . Osteoarthritis of left hip 05/19/2016  . CKD (chronic kidney disease) stage 3, GFR 30-59 ml/min 09/27/2014  . Anemia associated with acute blood loss 09/27/2014  . Orthostatic dizziness 09/27/2014  . GI bleed 09/27/2014  . Acute pyelonephritis 05/08/2014    Class: Acute  . Hydronephrosis of left kidney 05/08/2014    Class: Acute  . Chronic kidney disease 05/08/2014    Class: Chronic  . Ureteral mass 05/08/2014    Class: Acute  . UTI (lower urinary tract infection) 04/24/2013  . Chills 04/24/2013   Past Medical History:  Diagnosis Date  . Arthritis    DJD  . Benign enlargement of prostate    frequent UTI, trim of prostate done , with some issues of  incontinence now.  . Bronchitis    chronic- usually has phelgm often  . Depression   . Fluid retention in legs    occasional use of diuretic as needed  . GI bleed   . Hard of hearing    hear more on left side- no hearing aid  . History of transfusion   . Hypertension   . Perforated, severe stomach ulcer ([place])   . Spinal stenosis   . Transfusion history    none recent  . Ureteral stricture    CHRONIC    Past Surgical History:  Procedure Laterality Date  . ABDOMINAL SURGERY     bilroths tube '74  . COLONOSCOPY N/A 09/29/2014   Procedure: COLONOSCOPY;  Surgeon: Beryle Beams, MD;  Location: [facility];  Service: Endoscopy;  Laterality: N/A;  . CYSTOSCOPY W/ URETERAL STENT PLACEMENT Left 05/06/2014   Procedure: CYSTOSCOPY WITH RETROGRADE PYELOGRAM/URETERAL STENT PLACEMENT;  Surgeon: [name], MD;  Location: [facility];  Service: Urology;  Laterality: Left;  . CYSTOSCOPY W/ URETERAL STENT PLACEMENT Left 03/31/2015   Procedure: CYSTOSCOPY WITH LEFT RETROGRADE PYELOGRAM/URETERAL STENT EXCHANGE;  Surgeon: [name], MD;  Location: [facility];  Service: Urology;  Laterality: Left;  . CYSTOSCOPY W/ URETERAL STENT PLACEMENT Left 09/15/2015   Procedure: CYSTOSCOPY WITH LEFT RETROGRADE PYELOGRAM/URETERAL STENT EXCHANGE ;  Surgeon: [name], MD;  Location: [facility];  Service: Urology;  Laterality: Left;  . CYSTOSCOPY W/ URETERAL STENT PLACEMENT Left 04/28/2016   Procedure: CYSTOSCOPY WITH RETROGRADE PYELOGRAM/URETERAL STENT EXCHANGE;  Surgeon: [name], MD;  Location: [facility];  Service: Urology;  Laterality: Left;  .  CYSTOSCOPY WITH RETROGRADE PYELOGRAM, URETEROSCOPY AND STENT PLACEMENT Left 10/21/2014   Procedure: CYSTOSCOPY WITH RETROGRADE PYELOGRAM, URETEROSCOPY,BIOPSY AND STENT CHANGE;  Surgeon: [name], MD;  Location: [facility];  Service: Urology;  Laterality: Left;  . CYSTOSCOPY/RETROGRADE/URETEROSCOPY Left 06/03/2014   Procedure: CYSTOSCOPY/RETROGRADE/URETEROSCOPY WITH  BIOPSY,STENT EXCHANGE;  Surgeon: [name], MD;  Location: [facility];  Service: Urology;  Laterality: Left;  . ESOPHAGOGASTRODUODENOSCOPY N/A 09/28/2014   Procedure: ESOPHAGOGASTRODUODENOSCOPY (EGD);  Surgeon: Beryle Beams, MD;  Location: [facility];  Service: Endoscopy;  Laterality: N/A;  . GASTRIC ROUX-EN-Y     '74  . HERNIA REPAIR     bilateral hernia repairs   . TONSILLECTOMY    . TRANSURETHRAL RESECTION OF PROSTATE      No prescriptions prior to admission.   No Known Allergies  Social History  Substance Use Topics  . Smoking status: Never Smoker  . Smokeless tobacco: Former Systems developer    Types: Chew  . Alcohol use No    No family history on file.   Review of Systems  Musculoskeletal: Positive for joint pain.  All other systems reviewed and are negative.   Objective:  Physical Exam  Constitutional: He is oriented to person, place, and time. He appears well-developed and well-nourished.  HENT:  Head: Normocephalic and atraumatic.  Eyes: EOM are normal. Pupils are equal, round, and reactive to light.  Neck: Normal range of motion. Neck supple.  Cardiovascular: Normal rate and regular rhythm.   Respiratory: Effort normal and breath sounds normal.  GI: Soft. Bowel sounds are normal.  Musculoskeletal:       Left hip: He exhibits decreased range of motion, decreased strength, tenderness and bony tenderness.  Neurological: He is alert and oriented to person, place, and time.  Skin: Skin is warm and dry.  Psychiatric: He has a normal mood and affect.    Vital signs in last 24 hours:    Labs:   Estimated body mass index is 32.44 kg/m as calculated from the following:   Height as of 05/10/16: 5\' 6"  (1.676 m).   Weight as of 05/10/16: 91.2 kg (201 lb).   Imaging Review Plain radiographs demonstrate severe degenerative joint disease of the left hip(s). The bone quality appears to be good for age and reported activity level.  Assessment/Plan:  End stage arthritis,  left hip(s)  The patient history, physical examination, clinical judgement of the provider and imaging studies are consistent with end stage degenerative joint disease of the left hip(s) and total hip arthroplasty is deemed medically necessary. The treatment options including medical management, injection therapy, arthroscopy and arthroplasty were discussed at length. The risks and benefits of total hip arthroplasty were presented and reviewed. The risks due to aseptic loosening, infection, stiffness, dislocation/subluxation,  thromboembolic complications and other imponderables were discussed.  The patient acknowledged the explanation, agreed to proceed with the plan and consent was signed. Patient is being admitted for inpatient treatment for surgery, pain control, PT, OT, prophylactic antibiotics, VTE prophylaxis, progressive ambulation and ADL's and discharge planning.The patient is planning to be discharged home with home health services vs skilled nursing pending his progress with therapy.

## 2016-05-19 NOTE — Progress Notes (Signed)
PHARMACIST - PHYSICIAN ORDER COMMUNICATION  CONCERNING: P&T Medication Policy on Herbal Medications  DESCRIPTION:  This patient's order for:  Coenzyme Q10  has been noted.  This product(s) is classified as an "herbal" or natural product. Due to a lack of definitive safety studies or FDA approval, nonstandard manufacturing practices, plus the potential risk of unknown drug-drug interactions while on inpatient medications, the Pharmacy and Therapeutics Committee does not permit the use of "herbal" or natural products of this type within [facility].   ACTION TAKEN: The pharmacy department is unable to verify this order at this time and your patient has been informed of this safety policy. Please reevaluate patient's clinical condition at discharge and address if the herbal or natural product(s) should be resumed at that time.  Romeo Rabon, PharmD, pager [phone]. 05/19/2016,3:38 PM.

## 2016-05-19 NOTE — Transfer of Care (Signed)
Immediate Anesthesia Transfer of Care Note  Patient: Steven Cain  Procedure(s) Performed: Procedure(s): LEFT TOTAL HIP ARTHROPLASTY ANTERIOR APPROACH (Left)  Patient Location: PACU  Anesthesia Type:MAC and Spinal  Level of Consciousness: awake, alert  and patient cooperative  Airway & Oxygen Therapy: Patient Spontanous Breathing and Patient connected to face mask oxygen  Post-op Assessment: Report given to RN and Post -op Vital signs reviewed and stable  Post vital signs: Reviewed and stable  Last Vitals:  Vitals:   05/19/16 1032  BP: (!) 157/75  Pulse: 71  Resp: 18  Temp: 36.5 C    Last Pain:  Vitals:   05/19/16 1032  TempSrc: Oral      Patients Stated Pain Goal: 4 ([id number])  Complications: No apparent anesthesia complications

## 2016-05-19 NOTE — Brief Op Note (Signed)
05/19/2016  1:48 PM  PATIENT:  Steven Cain  82 y.o. male  PRE-OPERATIVE DIAGNOSIS:  Severe osteoarthritis left hip  POST-OPERATIVE DIAGNOSIS:  Severe osteoarthritis left hip  PROCEDURE:  Procedure(s): LEFT TOTAL HIP ARTHROPLASTY ANTERIOR APPROACH (Left)  SURGEON:  Surgeon(s) and Role:    * Mcarthur Rossetti, MD - Primary  PHYSICIAN ASSISTANT: Benita Stabile, PA-C  ANESTHESIA:   spinal  EBL:  Total I/O In: 1000 [I.V.:1000] Out: 200 [Blood:200]  COUNTS:  YES  DICTATION: .Other Dictation: Dictation Number [id number]  PLAN OF CARE: Admit to inpatient   PATIENT DISPOSITION:  PACU - hemodynamically stable.   Delay start of Pharmacological VTE agent (>24hrs) due to surgical blood loss or risk of bleeding: no

## 2016-05-19 NOTE — Anesthesia Postprocedure Evaluation (Signed)
Anesthesia Post Note  Patient: Steven Cain  Procedure(s) Performed: Procedure(s) ([facility]): LEFT TOTAL HIP ARTHROPLASTY ANTERIOR APPROACH (Left)  Patient location during evaluation: PACU Anesthesia Type: Spinal and MAC Level of consciousness: awake and alert Pain management: pain level controlled Vital Signs Assessment: post-procedure vital signs reviewed and stable Respiratory status: spontaneous breathing and respiratory function stable Cardiovascular status: blood pressure returned to baseline and stable Postop Assessment: spinal receding Anesthetic complications: no    Last Vitals:  Vitals:   05/19/16 1445 05/19/16 1515  BP: (!) 173/81 (!) 164/76  Pulse: 69 72  Resp: (!) 21 16  Temp:  36.5 C    Last Pain:  Vitals:   05/19/16 1430  TempSrc:   PainSc: 5                  Tiajuana Amass

## 2016-05-19 NOTE — Anesthesia Preprocedure Evaluation (Addendum)
Anesthesia Evaluation  Patient identified by MRN, date of birth, ID band Patient awake    Reviewed: Allergy & Precautions, NPO status , Patient's Chart, lab work & pertinent test results  Airway Mallampati: II  TM Distance: >3 FB Neck ROM: Full    Dental  (+) Missing   Pulmonary neg pulmonary ROS,    breath sounds clear to auscultation       Cardiovascular hypertension, Pt. on medications  Rhythm:Regular Rate:Normal     Neuro/Psych Depression negative neurological ROS     GI/Hepatic Neg liver ROS, PUD,   Endo/Other  negative endocrine ROS  Renal/GU CRFRenal disease     Musculoskeletal  (+) Arthritis ,   Abdominal   Peds  Hematology negative hematology ROS (+)   Anesthesia Other Findings   Reproductive/Obstetrics                            Lab Results  Component Value Date   WBC 10.1 05/10/2016   HGB 14.4 05/10/2016   HCT 41.8 05/10/2016   MCV 86.7 05/10/2016   PLT 218 05/10/2016   Lab Results  Component Value Date   CREATININE 1.69 (H) 05/10/2016   BUN 24 (H) 05/10/2016   NA 133 (L) 05/10/2016   K 4.8 05/10/2016   CL 97 (L) 05/10/2016   CO2 26 05/10/2016    Anesthesia Physical Anesthesia Plan  ASA: II  Anesthesia Plan: MAC and Spinal   Post-op Pain Management:    Induction: Intravenous  Airway Management Planned: Simple Face Mask and Natural Airway  Additional Equipment:   Intra-op Plan:   Post-operative Plan:   Informed Consent: I have reviewed the patients History and Physical, chart, labs and discussed the procedure including the risks, benefits and alternatives for the proposed anesthesia with the patient or authorized representative who has indicated his/her understanding and acceptance.     Plan Discussed with: CRNA  Anesthesia Plan Comments:        Anesthesia Quick Evaluation

## 2016-05-19 NOTE — Anesthesia Procedure Notes (Signed)
Spinal  Staffing Anesthesiologist: Suzette Battiest Performed: anesthesiologist  Preanesthetic Checklist Completed: patient identified, site marked, surgical consent, pre-op evaluation, timeout performed, IV checked, risks and benefits discussed and monitors and equipment checked Spinal Block Patient position: sitting Prep: Betadine and site prepped and draped Patient monitoring: heart rate, continuous pulse ox and blood pressure Approach: midline Location: L4-5 Injection technique: single-shot Needle Needle type: Sprotte  Needle gauge: 24 G Needle length: 10 cm

## 2016-05-20 LAB — CBC
HCT: 38.2 % — ABNORMAL LOW (ref 39.0–52.0)
HEMOGLOBIN: 12.6 g/dL — AB (ref 13.0–17.0)
MCH: 29.4 pg (ref 26.0–34.0)
MCHC: 33 g/dL (ref 30.0–36.0)
MCV: 89 fL (ref 78.0–100.0)
PLATELETS: 151 10*3/uL (ref 150–400)
RBC: 4.29 MIL/uL (ref 4.22–5.81)
RDW: 13.7 % (ref 11.5–15.5)
WBC: 14.4 10*3/uL — ABNORMAL HIGH (ref 4.0–10.5)

## 2016-05-20 LAB — BASIC METABOLIC PANEL
Anion gap: 11 (ref 5–15)
BUN: 22 mg/dL — AB (ref 6–20)
CALCIUM: 9.4 mg/dL (ref 8.9–10.3)
CO2: 26 mmol/L (ref 22–32)
CREATININE: 1.58 mg/dL — AB (ref 0.61–1.24)
Chloride: 98 mmol/L — ABNORMAL LOW (ref 101–111)
GFR, EST AFRICAN AMERICAN: 45 mL/min — AB (ref >60)
GFR, EST NON AFRICAN AMERICAN: 39 mL/min — AB (ref >60)
Glucose, Bld: 131 mg/dL — ABNORMAL HIGH (ref 65–99)
Potassium: 4.6 mmol/L (ref 3.5–5.1)
SODIUM: 135 mmol/L (ref 135–145)

## 2016-05-20 NOTE — Evaluation (Signed)
Occupational Therapy Evaluation Patient Details Name: Steven Cain MRN: [id number] DOB: Oct 08, 1930 Today's Date: 05/20/2016    History of Present Illness s/p L DA THA; h/o spinal stenosis   Clinical Impression   This 82 year old man was admitted for the above sx. He will benefit from continued OT in acute setting with supervision level goal for toileting and to further educate on tub DME.  Pt's wife assisted with LB adls prior to sx due to his pain    Follow Up Recommendations  No OT follow up;Supervision/Assistance - 24 hour    Equipment Recommendations  3 in 1 bedside comode    Recommendations for Other Services       Precautions / Restrictions Precautions Precautions: Fall Restrictions Weight Bearing Restrictions: No      Mobility Bed Mobility               General bed mobility comments: oob  Transfers Overall transfer level: Needs assistance Equipment used: Rolling walker (2 wheeled) Transfers: Sit to/from Stand Sit to Stand: Min assist         General transfer comment: cues for UE placement; assist to rise and steady    Balance                                            ADL Overall ADL's : Needs assistance/impaired                         Toilet Transfer: Min guard;Ambulation;RW (to chair)             General ADL Comments: ambulated to bathroom sink; pt could not tolerate standing for grooming due to back pain (which was worse than hip) and fatique. He would benefit from 3:1 at home.  Educated to work within his comfort.  He may need to sponge bathe initially or get tub bench     Vision     Perception     Praxis      Pertinent Vitals/Pain Pain Assessment: Faces Faces Pain Scale: Hurts even more Pain Location: back Pain Intervention(s): Limited activity within patient's tolerance;Monitored during session;Repositioned (ice for hip; declines requesting pain meds)     Hand Dominance      Extremity/Trunk Assessment Upper Extremity Assessment Upper Extremity Assessment: LUE deficits/detail LUE Deficits / Details: able to lift to 80 FF; states he needs a shoulder replacement           Communication Communication Communication: No difficulties   Cognition Arousal/Alertness: Awake/alert Behavior During Therapy: WFL for tasks assessed/performed Overall Cognitive Status: Within Functional Limits for tasks assessed                     General Comments       Exercises       Shoulder Instructions      Home Living Family/patient expects to be discharged to:: Private residence Living Arrangements: Spouse/significant other                 Bathroom Shower/Tub: Tub/shower unit Shower/tub characteristics: Curtain Biochemist, clinical: Standard     Home Equipment: Environmental consultant - 4 wheels;Grab bars - tub/shower          Prior Functioning/Environment Level of Independence: Needs assistance        Comments: wife assisted with LB adls due to back pain  OT Problem List: Decreased activity tolerance;Pain;Decreased knowledge of use of DME or AE   OT Treatment/Interventions: Self-care/ADL training;DME and/or AE instruction;Patient/family education    OT Goals(Current goals can be found in the care plan section) Acute Rehab OT Goals Patient Stated Goal: be able to exercise again OT Goal Formulation: With patient Time For Goal Achievement: 05/27/16 Potential to Achieve Goals: Good ADL Goals Pt Will Transfer to Toilet: with supervision;ambulating;bedside commode Additional ADL Goal #1: pt will verbalize understanding of tub bench vs sponge bathing (and if interested in this, perform transfer with min guard)  OT Frequency: Min 2X/week   Barriers to D/C:            Co-evaluation              End of Session    Activity Tolerance: Patient limited by lethargy;Patient limited by fatigue Patient left: in chair;with call bell/phone within  reach;with chair alarm set   Time: [phone] OT Time Calculation (min): 22 min Charges:  OT General Charges $OT Visit: 1 Procedure OT Evaluation $OT Eval Low Complexity: 1 Procedure G-Codes:    Bryce Cheever 05-31-2016, 9:26 AM Lesle Chris, OTR/L [phone] 05-31-16

## 2016-05-20 NOTE — Op Note (Signed)
[name], [name]:  [id number]  MEDICAL RECORD NO.:  [id number]  LOCATION:  44                         FACILITY:  [facility]  PHYSICIAN:  Lind Guest. Ninfa Linden, M.D.DATE OF BIRTH:  [birth year]  DATE OF PROCEDURE:  05/19/2016 DATE OF DISCHARGE:                              OPERATIVE REPORT   PREOPERATIVE DIAGNOSIS:  Severe osteoarthritis and degenerative joint disease, left hip.  POSTOPERATIVE DIAGNOSIS:  Severe osteoarthritis and degenerative joint disease, left hip.  PROCEDURE:  Left total hip arthroplasty through direct anterior approach.  IMPLANTS:  DePuy Sector Gription acetabular component size 54, size 36+ 0 polyethylene liner, size 12 Corail femoral component with varus offset (KLA), size 36+ 5 metal hip ball.  SURGEON:  Lind Guest. Ninfa Linden, M.D.  ASSISTANT:  Erskine Emery, PA-C.  ANESTHESIA:  Spinal.  ANTIBIOTICS:  2 g of IV Ancef.  BLOOD LOSS:  200-250 mL.  COMPLICATIONS:  None.  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  His left hip was involving the most. At this point, he has tried and failed all forms of conservative treatment and does wish to proceed with a total hip arthroplasty through direct anterior approach.  He understands the risk of acute blood loss anemia, nerve and vessel injury, fracture, infection, dislocation, DVT. He understands our goals are decreased pain, improved mobility and overall improved quality of life.  Right now, he says quality of life is miserable, his mobility is quite limited and his activities of daily living have been detrimentally affected by his left painful osteoarthritic hip.  PROCEDURE DESCRIPTION:  After informed consent was obtained, appropriate left hip was marked.  He was brought to the operating room, where spinal anesthesia was obtained while he  was on the stretcher.  He was then laid in a supine position and I tried to place a Foley catheter, but he has had recent stents and hypospadias, so we went without a catheter.  We placed traction boots on both of his feet.  Next, placed supine on the Hana fracture table with the perineal post in place and both legs in inline skeletal traction devices, but no traction applied.  His left operative hip was prepped and draped with DuraPrep and sterile drapes. A time-out was called and he was identified as correct patient and correct left hip.  We then made an incision inferior and posterior to the anterior superior iliac spine and carried this obliquely down the leg.  We dissected down the tensor fascia lata muscle and tensor fascia was then divided longitudinally to proceed with a direct anterior approach through the hip.  We identified and cauterized the circumflex vessels and then identified the hip capsule, opened the hip capsule in an L-type format finding significant joint effusion and periarticular osteophytes.  We placed our Cobra retractors within the hip capsule and made our femoral neck cut with an oscillating saw just proximal to lesser trochanter and completed this with an osteotome.  We placed a corkscrew guide in the femoral head and removed the femoral head in its entirety and found it to be  devoid of cartilage.  I placed a bent Hohmann over the medial acetabular rim and then cleaned the debris from the acetabulum and remnants of the acetabular labrum.  We then began reaming under direct visualization and direct fluoroscopy from a size 43 reamer up to a size 54, the last reamer was definitely placed under direct fluoroscopy as well, so we obtained our depth of reaming, our inclination and anteversion.  Once we were pleased with this, we placed the real DePuy Sector Gription acetabular component size 54 and a 36+ 0 neutral polyethylene liner for that size acetabular  component. Attention was then turned to the femur.  With the leg externally rotated to 120 degrees, extended and adducted, we were able to release the lateral joint capsule, placed a Mueller retractor medially and a Hohmann retractor behind the greater trochanter.  We used a box cutting osteotome to enter the femoral canal and a rongeur to lateralize.  We then began broaching from a size 8 broach up to a size 12.  With the size 12 in place, we trialed a varus offset femoral neck based off his anatomy and a 36+ 1.5 hip ball.  We reduced this in the acetabulum and I felt like we needed just a little bit more leg length, so we dislocated that and trialed a +5 and we were pleased with stability, range of motion, leg length and offset at that point.  We then dislocated the hip and removed the trial components.  We were able to place the real Corail femoral component size 12 with varus offset and the real 36+ 5 metal hip ball and reduced this in the acetabulum.  Again, we were pleased with the leg length, offset and stability.  We then irrigated the soft tissue with normal saline solution using pulsatile lavage.  We closed the joint capsule with interrupted #1 Ethibond suture followed by running #1 Vicryl in the tensor fascia, 0 Vicryl in the deep tissue, 2-0 Vicryl in the subcutaneous tissue and interrupted staples on the skin.  Xeroform and Aquacel dressing were applied.  He was taken off the fracture table and taken off the Hana table and taken to the recovery room in stable condition.  All final counts were correct.  There were no complications noted.  Of note, Erskine Emery, PA-C, assisted in the entire case.  His assistance was crucial for facilitating all aspects of this case.     Lind Guest. Ninfa Linden, M.D.     CYB/MEDQ  D:  05/19/2016  T:  05/20/2016  Job:  [id number]

## 2016-05-20 NOTE — Progress Notes (Signed)
Pt up to bathroom accompined by nursing tech. Complained of episode of feeling lightheaded. States "felt like I was going to pass out". Pt slightly pale. Escorted safely back to bed. Vital signs stable. Pt reports feeling better after lying down. Denies pain.

## 2016-05-20 NOTE — Evaluation (Signed)
Physical Therapy Evaluation Patient Details Name: Steven Cain MRN: [id number] DOB: [birth year] Today's Date: 05/20/2016   History of Present Illness  Pt is an 82 year old male s/p L DA THA; h/o spinal stenosis  Clinical Impression  Pt is s/p L THA resulting in the deficits listed below (see PT Problem List).  Pt will benefit from skilled PT to increase their independence and safety with mobility to allow discharge to the venue listed below.  Pt reports no hip pain however chronic back pain from spinal stenosis.  Pt very pleasant and hopes to become more physically active upon recovering from hip surgery.     Follow Up Recommendations Home health PT;Supervision/Assistance - 24 hour    Equipment Recommendations  Rolling walker with 5" wheels    Recommendations for Other Services       Precautions / Restrictions Precautions Precautions: Fall Restrictions Weight Bearing Restrictions: No Other Position/Activity Restrictions: WBAT      Mobility  Bed Mobility               General bed mobility comments: pt up in recliner on arrival  Transfers Overall transfer level: Needs assistance Equipment used: Rolling walker (2 wheeled) Transfers: Sit to/from Stand Sit to Stand: Min assist         General transfer comment: cues for UE and LE positioning; assist to rise and steady  Ambulation/Gait Ambulation/Gait assistance: Min assist Ambulation Distance (Feet): 15 Feet (x2) Assistive device: Rolling walker (2 wheeled) Gait Pattern/deviations: Step-to pattern;Antalgic;Decreased stance time - left     General Gait Details: verbal cues for sequence, step length, RW poistioning, posture, reports he fatigues quickly at baseline (due to lack of activity in the past 6 months), required seated rest break due to fatigue  Stairs            Wheelchair Mobility    Modified Rankin (Stroke Patients Only)       Balance                                              Pertinent Vitals/Pain Pain Assessment: 0-10 Pain Score: 5  Faces Pain Scale: Hurts even more Pain Location: back Pain Descriptors / Indicators: Aching Pain Intervention(s): Monitored during session;Limited activity within patient's tolerance;Repositioned;Ice applied    Home Living Family/patient expects to be discharged to:: Private residence Living Arrangements: Spouse/significant other   Type of Home: House Home Access: Level entry     Home Layout: One level Home Equipment: Environmental consultant - 4 wheels;Grab bars - tub/shower      Prior Function Level of Independence: Needs assistance   Gait / Transfers Assistance Needed: has rollator     Comments: wife assisted with LB adls due to back pain     Hand Dominance        Extremity/Trunk Assessment   Upper Extremity Assessment: LUE deficits/detail       LUE Deficits / Details: able to lift to 80 FF; states he needs a shoulder replacement   Lower Extremity Assessment: LLE deficits/detail   LLE Deficits / Details: L hip grossly 2+/5     Communication   Communication: No difficulties  Cognition Arousal/Alertness: Awake/alert Behavior During Therapy: WFL for tasks assessed/performed Overall Cognitive Status: Within Functional Limits for tasks assessed  General Comments      Exercises Total Joint Exercises Ankle Circles/Pumps: AROM;Both;10 reps Quad Sets: AROM;Left;10 reps Hip ABduction/ADduction: AAROM;Left;10 reps Long Arc Quad: AROM;Seated;Left;10 reps Marching in Standing: AROM;Left;Seated;5 reps   Assessment/Plan    PT Assessment Patient needs continued PT services  PT Problem List Decreased strength;Decreased knowledge of use of DME;Decreased activity tolerance;Decreased mobility;Pain          PT Treatment Interventions Functional mobility training;Stair training;DME instruction;Gait training;Therapeutic exercise;Therapeutic activities;Patient/family education    PT  Goals (Current goals can be found in the Care Plan section)  Acute Rehab PT Goals Patient Stated Goal: be able to exercise again PT Goal Formulation: With patient Time For Goal Achievement: 05/24/16 Potential to Achieve Goals: Good    Frequency 7X/week   Barriers to discharge        Co-evaluation               End of Session Equipment Utilized During Treatment: Gait belt Activity Tolerance: Patient limited by fatigue Patient left: in chair;with call bell/phone within reach;with chair alarm set Nurse Communication: Mobility status         Time: [id number] PT Time Calculation (min) (ACUTE ONLY): 17 min   Charges:   PT Evaluation $PT Eval Low Complexity: 1 Procedure     PT G Codes:        Modean Mccullum,KATHrine E 05/20/2016, 10:09 AM Carmelia Bake, PT, DPT 05/20/2016 Pager: [phone]

## 2016-05-20 NOTE — Progress Notes (Signed)
Physical Therapy Treatment Note    05/20/16 1500  PT Visit Information  Last PT Received On 05/20/16  Assistance Needed +1  History of Present Illness Pt is an 82 year old male s/p L DA THA; h/o spinal stenosis  Subjective Data  Subjective Pt assisted with ambulating in hallway and requires seated rest break.  SPO2 85% room air however poor signal during reading and pt's hands cold (NT reports pt's SpO2 has been normal today).    Precautions  Precautions Fall  Restrictions  Other Position/Activity Restrictions WBAT  Pain Assessment  Pain Assessment 0-10  Pain Score 5  Pain Location back  Pain Descriptors / Indicators Aching;Radiating (radiating down posterior legs)  Pain Intervention(s) Limited activity within patient's tolerance;Monitored during session;Repositioned  Cognition  Arousal/Alertness Awake/alert  Behavior During Therapy WFL for tasks assessed/performed  Overall Cognitive Status Within Functional Limits for tasks assessed  Bed Mobility  Overal bed mobility Needs Assistance  Bed Mobility Sit to Supine  Sit to supine Min assist  General bed mobility comments verbal cues for technique, slight assist for LEs onto bed  Transfers  Overall transfer level Needs assistance  Equipment used Rolling walker (2 wheeled)  Transfers Sit to/from Stand  Sit to Stand Min assist  General transfer comment cues for UE and LE positioning; assist to rise and steady  Ambulation/Gait  Ambulation/Gait assistance Min assist  Ambulation Distance (Feet) 20 Feet (x2)  Assistive device Rolling walker (2 wheeled)  Gait Pattern/deviations Step-to pattern;Antalgic;Decreased stance time - left  General Gait Details verbal cues for sequence, step length, RW poistioning, posture, required seated rest break due to fatigue  PT - End of Session  Equipment Utilized During Treatment Gait belt  Activity Tolerance Patient limited by fatigue  Patient left in bed;with call bell/phone within reach;with bed  alarm set;with family/visitor present  PT - Assessment/Plan  PT Plan Current plan remains appropriate  PT Frequency (ACUTE ONLY) 7X/week  Follow Up Recommendations Home health PT;Supervision/Assistance - 24 hour  PT equipment Rolling walker with 5" wheels  PT Goal Progression  Progress towards PT goals Progressing toward goals  PT Time Calculation  PT Start Time (ACUTE ONLY) 1357  PT Stop Time (ACUTE ONLY) 1414  PT Time Calculation (min) (ACUTE ONLY) 17 min  PT General Charges  $$ ACUTE PT VISIT 1 Procedure  PT Treatments  $Gait Training 8-22 mins   Carmelia Bake, PT, DPT 05/20/2016 Pager: [phone]

## 2016-05-21 MED ORDER — ASPIRIN 325 MG PO TBEC: 325.0000 mg | DELAYED_RELEASE_TABLET | Freq: Two times a day (BID) | ORAL | 0 refills | Status: DC

## 2016-05-21 MED ORDER — OXYCODONE-ACETAMINOPHEN 5-325 MG PO TABS: 1.0000 | ORAL_TABLET | ORAL | 0 refills | Status: DC | PRN

## 2016-05-21 NOTE — Progress Notes (Addendum)
Physical Therapy Treatment Patient Details Name: Steven Cain MRN: [id number] DOB: 1931-03-10 Today's Date: 05/21/2016    History of Present Illness Pt is an 82 year old male s/p L DA THA; h/o spinal stenosis    PT Comments    The patient is  Improving. Spouse present for stair and exercise instruction Patient needs a RW.  Follow Up Recommendations  Home health PT;Supervision/Assistance - 24 hour     Equipment Recommendations  Rolling walker with 5" wheels    Recommendations for Other Services       Precautions / Restrictions Precautions Precautions: Fall    Mobility  Bed Mobility Overal bed mobility: Needs Assistance Bed Mobility: Supine to Sit     Supine to sit: Min assist Sit to supine: Min assist   General bed mobility comments: wife present to assist the patient  Transfers Overall transfer level: Needs assistance Equipment used: Rolling walker (2 wheeled) Transfers: Sit to/from Stand Sit to Stand: Min guard         General transfer comment: cues for hand placement,  Ambulation/Gait Ambulation/Gait assistance: Min assist Ambulation Distance (Feet): 40 Feet Assistive device: Rolling walker (2 wheeled) Gait Pattern/deviations: Step-to pattern;Step-through pattern Gait velocity: decr   General Gait Details: verbal cues for sequence, step length, RW poistioning, posture,    Stairs Stairs: Yes Stairs assistance: Min assist Stair Management: Backwards;With walker Number of Stairs: 2 General stair comments: spouse present to assist with the RW.  Wheelchair Mobility    Modified Rankin (Stroke Patients Only)       Balance                                    Cognition Arousal/Alertness: Awake/alert                          Exercises Total Joint Exercises Ankle Circles/Pumps: AROM;Both;10 reps Quad Sets: AROM;Left;10 reps Short Arc Quad: AROM;Left;10 reps Heel Slides: AAROM;Left;10 reps Hip ABduction/ADduction:  AAROM;Left;10 reps Long Arc Quad: AROM;Seated;Left;10 reps    General Comments        Pertinent Vitals/Pain Faces Pain Scale: Hurts a little bit Pain Location: L hip Pain Descriptors / Indicators: Discomfort Pain Intervention(s): Monitored during session;Ice applied    Home Living                      Prior Function            PT Goals (current goals can now be found in the care plan section) Progress towards PT goals: Progressing toward goals    Frequency    7X/week      PT Plan Current plan remains appropriate    Co-evaluation             End of Session Equipment Utilized During Treatment: Gait belt Activity Tolerance: Patient tolerated treatment well Patient left: in bed;with call bell/phone within reach;with bed alarm set;with family/visitor present     Time: 1415-1456 PT Time Calculation (min) (ACUTE ONLY): 41 min  Charges:  $Gait Training: 8-22 mins $Therapeutic Exercise: 8-22 mins $Self Care/Home Management: 8-22                    G Codes:      [name] 05/21/2016, 3:42 PM Tresa Endo PT [phone]

## 2016-05-21 NOTE — Discharge Instructions (Signed)

## 2016-05-21 NOTE — Progress Notes (Signed)
Physical Therapy Treatment Patient Details Name: Steven Cain MRN: [id number] DOB: Aug 11, 1931 Today's Date: 05/21/2016    History of Present Illness Pt is an 82 year old male s/p L DA THA; h/o spinal stenosis    PT Comments    The patient is progressing well. Plans Dc tomorrow.  Follow Up Recommendations  Home health PT;Supervision/Assistance - 24 hour     Equipment Recommendations  Rolling walker with 5" wheels    Recommendations for Other Services       Precautions / Restrictions Precautions Precautions: Fall Restrictions Weight Bearing Restrictions: No Other Position/Activity Restrictions: WBAT    Mobility  Bed Mobility   Bed Mobility: Supine to Sit     Supine to sit: Min assist Sit to supine: Min assist   General bed mobility comments: assist with the L eft leg(patient sleeps in a recliner at home)  Transfers Overall transfer level: Needs assistance Equipment used: Rolling walker (2 wheeled) Transfers: Sit to/from Stand Sit to Stand: Min assist         General transfer comment: light min A to rise and steady.  Cues for UE placement  Ambulation/Gait Ambulation/Gait assistance: Min assist Ambulation Distance (Feet): 50 Feet   Gait Pattern/deviations: Step-to pattern;Step-through pattern Gait velocity: decr   General Gait Details: verbal cues for sequence, step length, RW poistioning, posture,    Stairs            Wheelchair Mobility    Modified Rankin (Stroke Patients Only)       Balance                                    Cognition Arousal/Alertness: Awake/alert Behavior During Therapy: WFL for tasks assessed/performed Overall Cognitive Status: Within Functional Limits for tasks assessed                      Exercises Total Joint Exercises Ankle Circles/Pumps: AROM;Both;10 reps Quad Sets: AROM;Left;10 reps Short Arc Quad: AROM;Left;10 reps Heel Slides: AAROM;Left;10 reps Hip ABduction/ADduction:  AAROM;Left;10 reps    General Comments        Pertinent Vitals/Pain Faces Pain Scale: Hurts a little bit Pain Location: L hip Pain Descriptors / Indicators: Aching Pain Intervention(s): Monitored during session;Ice applied;Limited activity within patient's tolerance    Home Living                      Prior Function            PT Goals (current goals can now be found in the care plan section) Progress towards PT goals: Progressing toward goals    Frequency    7X/week      PT Plan Current plan remains appropriate    Co-evaluation             End of Session Equipment Utilized During Treatment: Gait belt Activity Tolerance: Patient limited by fatigue Patient left: with call bell/phone within reach;with bed alarm set;with family/visitor present;in chair     Time: [id number] PT Time Calculation (min) (ACUTE ONLY): 42 min  Charges:  $Gait Training: 8-22 mins $Therapeutic Exercise: 8-22 mins $Self Care/Home Management: 2023/04/20                    G Codes:      Steven Cain 05/21/2016, 1:13 PM

## 2016-05-21 NOTE — Progress Notes (Signed)
Occupational Therapy Treatment Patient Details Name: Steven Cain MRN: [id number] DOB: 21-May-1931 Today's Date: 05/21/2016    History of present illness Pt is an 82 year old male s/p L DA THA; h/o spinal stenosis   OT comments  Limited by fatique this session. Cues for safety.  Pt states he has a tub bench at home  Follow Up Recommendations  No OT follow up    Equipment Recommendations  3 in 1 bedside comode    Recommendations for Other Services      Precautions / Restrictions Precautions Precautions: Fall Restrictions Weight Bearing Restrictions: No Other Position/Activity Restrictions: WBAT       Mobility Bed Mobility           Sit to supine: Min assist   General bed mobility comments: min A for LLE back to bed  Transfers   Equipment used: Rolling walker (2 wheeled)   Sit to Stand: Min assist         General transfer comment: light min A to rise and steady.  Cues for UE placement    Balance                                   ADL                           Toilet Transfer: Min guard;RW;Stand-pivot (to bed)             General ADL Comments: pt very fatiqued and wanted to get back to bed.  Cues given for safety as he turned body without RW.  Pt states he has a tub bench at home.        Vision                     Perception     Praxis      Cognition   Behavior During Therapy: WFL for tasks assessed/performed Overall Cognitive Status: Within Functional Limits for tasks assessed                       Extremity/Trunk Assessment               Exercises     Shoulder Instructions       General Comments      Pertinent Vitals/ Pain       Faces Pain Scale: Hurts little more Pain Location: back Pain Descriptors / Indicators: Aching Pain Intervention(s): Limited activity within patient's tolerance;Monitored during session;Repositioned (removed ice; pt declined further)  Home Living                                          Prior Functioning/Environment              Frequency           Progress Toward Goals  OT Goals(current goals can now be found in the care plan section)  Progress towards OT goals: Progressing toward goals     Plan      Co-evaluation                 End of Session     Activity Tolerance Patient limited by fatigue   Patient Left in bed;with call bell/phone within reach;with bed alarm  set   Nurse Communication          Time: [phone] OT Time Calculation (min): 13 min  Charges: OT General Charges $OT Visit: 1 Procedure OT Treatments $Therapeutic Activity: 8-22 mins  Jaleen Grupp 05/21/2016, 11:49 AM  Lesle Chris, OTR/L [phone] 05/21/2016

## 2016-05-21 NOTE — Progress Notes (Signed)
Subjective: 2 Days Post-Op Procedure(s) (LRB): LEFT TOTAL HIP ARTHROPLASTY ANTERIOR APPROACH (Left) Patient reports pain as mild.    Objective: Vital signs in last 24 hours: Temp:  [97.3 F (36.3 C)-98.6 F (37 C)] 98.6 F (37 C) (10/08 0700) Pulse Rate:  [80-90] 90 (10/08 0700) Resp:  [14-16] 15 (10/08 0700) BP: (135-155)/(57-67) 140/60 (10/08 0700) SpO2:  [96 %-100 %] 97 % (10/08 0700)  Intake/Output from previous day: 10/07 0701 - 10/08 0700 In: 840 [P.O.:840] Out: -  Intake/Output this shift: Total I/O In: 240 [P.O.:240] Out: -    Recent Labs  05/20/16 0501  HGB 12.6*    Recent Labs  05/20/16 0501  WBC 14.4*  RBC 4.29  HCT 38.2*  PLT 151    Recent Labs  05/20/16 0501  NA 135  K 4.6  CL 98*  CO2 26  BUN 22*  CREATININE 1.58*  GLUCOSE 131*  CALCIUM 9.4   No results for input(s): LABPT, INR in the last 72 hours.  Sensation intact distally Intact pulses distally Dorsiflexion/Plantar flexion intact Incision: scant drainage  Assessment/Plan: 2 Days Post-Op Procedure(s) (LRB): LEFT TOTAL HIP ARTHROPLASTY ANTERIOR APPROACH (Left) Up with therapy Plan for discharge tomorrow Discharge home with home health  Steven Cain 05/21/2016, 9:30 AM

## 2016-05-22 NOTE — Progress Notes (Signed)
Patient ID: Steven Cain, male   DOB: April 14, 1931, 82 y.o.   MRN: [id number] Doing well.  Vitals stable.  Left hip stable.  Can be discharged to home today.

## 2016-05-22 NOTE — Discharge Summary (Signed)
Patient ID: Steven Cain MRN: [id number] DOB/AGE: September 10, 1930 82 y.o.  Admit date: 05/19/2016 Discharge date: 05/22/2016  Admission Diagnoses:  Principal Problem:   Osteoarthritis of left hip Active Problems:   Status post left hip replacement   Discharge Diagnoses:  Same  Past Medical History:  Diagnosis Date  . Arthritis    DJD  . Benign enlargement of prostate    frequent UTI, trim of prostate done , with some issues of incontinence now.  . Bronchitis    chronic- usually has phelgm often  . Depression   . Fluid retention in legs    occasional use of diuretic as needed  . GI bleed   . Hard of hearing    hear more on left side- no hearing aid  . History of transfusion   . Hypertension   . Perforated, severe stomach ulcer ([place])   . Spinal stenosis   . Transfusion history    none recent  . Ureteral stricture    CHRONIC    Surgeries: Procedure(s): LEFT TOTAL HIP ARTHROPLASTY ANTERIOR APPROACH on 05/19/2016   Consultants:   Discharged Condition: Improved  Hospital Course: MUBARAK PELON is an 82 y.o. male who was admitted 05/19/2016 for operative treatment ofOsteoarthritis of left hip. Patient has severe unremitting pain that affects sleep, daily activities, and work/hobbies. After pre-op clearance the patient was taken to the operating room on 05/19/2016 and underwent  Procedure(s): LEFT TOTAL HIP ARTHROPLASTY ANTERIOR APPROACH.    Patient was given perioperative antibiotics: Anti-infectives    Start     Dose/Rate Route Frequency Ordered Stop   05/19/16 1800  ceFAZolin (ANCEF) IVPB 1 g/50 mL premix     1 g 100 mL/hr over 30 Minutes Intravenous Every 6 hours 05/19/16 1532 05/20/16 0026   05/19/16 1014  ceFAZolin (ANCEF) IVPB 2g/100 mL premix     2 g 200 mL/hr over 30 Minutes Intravenous On call to O.R. 05/19/16 1014 05/19/16 1235       Patient was given sequential compression devices, early ambulation, and chemoprophylaxis to prevent DVT.  Patient benefited  maximally from hospital stay and there were no complications.    Recent vital signs: Patient Vitals for the past 24 hrs:  BP Temp Temp src Pulse Resp SpO2  05/22/16 0532 (!) 137/55 99.1 F (37.3 C) Oral 89 15 98 %  05/21/16 2021 120/80 99 F (37.2 C) Oral 86 16 98 %     Recent laboratory studies:  Recent Labs  05/20/16 0501  WBC 14.4*  HGB 12.6*  HCT 38.2*  PLT 151  NA 135  K 4.6  CL 98*  CO2 26  BUN 22*  CREATININE 1.58*  GLUCOSE 131*  CALCIUM 9.4     Discharge Medications:     Medication List    STOP taking these medications   cephALEXin 500 MG capsule Commonly known as:  KEFLEX     TAKE these medications   aspirin 325 MG EC tablet Take 1 tablet (325 mg total) by mouth 2 (two) times daily after a meal. What changed:  medication strength  how much to take  when to take this   co-enzyme Q-10 30 MG capsule Take 30 mg by mouth every morning.   fish oil-omega-3 fatty acids 1000 MG capsule Take 2 g by mouth every morning.   hydrochlorothiazide 12.5 MG tablet Commonly known as:  HYDRODIURIL Take 12.5 mg by mouth every other day.   meclizine 25 MG tablet Commonly known as:  ANTIVERT Take 25 mg  by mouth 2 (two) times daily as needed for dizziness.   meloxicam 15 MG tablet Commonly known as:  MOBIC Take 15 mg by mouth daily.   multivitamin with minerals Tabs tablet Take 1 tablet by mouth every morning.   oxyCODONE-acetaminophen 5-325 MG tablet Commonly known as:  ROXICET Take 1-2 tablets by mouth every 4 (four) hours as needed.   PARoxetine 20 MG tablet Commonly known as:  PAXIL Take 20 mg by mouth 6 (six) times daily. If sleepy only take 1 tablet   psyllium 95 % Pack Commonly known as:  HYDROCIL/METAMUCIL Take 1 packet by mouth daily.   testosterone cypionate 200 MG/ML injection Commonly known as:  DEPOTESTOSTERONE CYPIONATE Inject 60 mg into the muscle every 6 (six) weeks. 3/10 of cc.   traMADol 50 MG tablet Commonly known as:   ULTRAM Take 1 tablet (50 mg total) by mouth every 6 (six) hours as needed for moderate pain. Post-operatively   VITAMIN B-12 IJ Inject 1 mL as directed every 30 (thirty) days.   vitamin C 1000 MG tablet Take 1,000 mg by mouth every morning.   vitamin E 100 UNIT capsule Take 100 Units by mouth every morning.       Diagnostic Studies: Dg Abd 1 View  Result Date: 04/28/2016 CLINICAL DATA:  LEFT stent exchange EXAM: ABDOMEN - 1 VIEW; DG C-ARM 1-60 MIN-NO REPORT COMPARISON:  CT abdomen and pelvis 05/06/2014 FLUOROSCOPY TIME:  0 minutes 6 seconds Images obtained: 2 FINDINGS: Two submitted images demonstrate presence of a LEFT ureteral stent extending from the upper pole of the LEFT renal collecting system to the urinary bladder. Small off contrast material opacifies a minimally prominent LEFT renal pelvis. IMPRESSION: LEFT ureteral stent. Electronically Signed   By: Lavonia Dana M.D.   On: 04/28/2016 12:05   Dg C-arm 1-60 Min-no Report  Result Date: 05/19/2016 CLINICAL DATA: surgery C-ARM 1-60 MINUTES Fluoroscopy was utilized by the requesting physician.  No radiographic interpretation.   Dg C-arm 1-60 Min-no Report  Result Date: 04/28/2016 CLINICAL DATA: surgery C-ARM 1-60 MINUTES Fluoroscopy was utilized by the requesting physician.  No radiographic interpretation.   Dg Hip Port Unilat With Pelvis 1v Left  Result Date: 05/19/2016 CLINICAL DATA:  Portable postoperative views of left hip joint replacement obtained in PACU. EXAM: DG HIP (WITH OR WITHOUT PELVIS) 1V PORT LEFT COMPARISON:  Intraoperative fluoro spot images of today's date FINDINGS: The patient has undergone left total hip arthroplasty. Radiographic positioning of the prosthetic components is good. The native bone appears intact. The lower portion of a left ureteral stent is visible. There are surgical skin staples over the lateral aspect of the hip IMPRESSION: No postprocedure complication following left total hip joint  replacement. Electronically Signed   By: David  Martinique M.D.   On: 05/19/2016 14:56   Dg Hip Operative Unilat With Pelvis Left  Result Date: 05/19/2016 CLINICAL DATA:  Imaging following left hip replacement. EXAM: OPERATIVE LEFT HIP (WITH PELVIS IF PERFORMED) 3 VIEWS TECHNIQUE: Fluoroscopic spot image(s) were submitted for interpretation post-operatively. COMPARISON:  None. FINDINGS: Initial image shows advanced arthropathic changes of the left hip. Subsequent images show a total left hip arthroplasty with the femoral and acetabular components well seated and well aligned. No acute fracture or evidence of an operative complication. IMPRESSION: Well-positioned total left hip arthroplasty. Electronically Signed   By: Lajean Manes M.D.   On: 05/19/2016 14:24    Disposition: 01-Home or Self Care  Discharge Instructions    Call MD / Call  911    Complete by:  As directed    If you experience chest pain or shortness of breath, CALL 911 and be transported to the hospital emergency room.  If you develope a fever above 101 F, pus (white drainage) or increased drainage or redness at the wound, or calf pain, call your surgeon's office.   Constipation Prevention    Complete by:  As directed    Drink plenty of fluids.  Prune juice may be helpful.  You may use a stool softener, such as Colace (over the counter) 100 mg twice a day.  Use MiraLax (over the counter) for constipation as needed.   Diet - low sodium heart healthy    Complete by:  As directed    Discharge patient    Complete by:  As directed    Increase activity slowly as tolerated    Complete by:  As directed       Follow-up Information    Mcarthur Rossetti, MD Follow up in 2 week(s).   Specialty:  Orthopedic Surgery Contact information: [place] [address] [phone]        [facility] .   Why:  3n1 Contact information: [address] [place] [address] [phone]         [place] .   Why:  [facility] Contact information: [address] [place] [address] [phone]            Signed: Mcarthur Rossetti 05/22/2016, 5:36 PM

## 2016-05-22 NOTE — Care Management Note (Signed)
Case Management Note  Patient Details  Name: Steven Cain MRN: [id number] Date of Birth: 01/14/1931  Subjective/Objective:82 y/o m admitted w/OA L hip. S/p L THA. From home. Has rw. PT recc HHPT;OT-no f/u, 3n1. [facility] chosen for HHPT-rep Santiago Glad aware & accepted,aware of d/c today. [facility] dme rep Jermaine aware of 3n1 order, & d/c home today to deliver 3n1 to rm. No further CM needs.                    Action/Plan:d/c home w/HHC/DME.   Expected Discharge Date:                  Expected Discharge Plan:  [place]  In-House Referral:     Discharge planning Services  CM Consult  Post Acute Care Choice:  Durable Medical Equipment (rolling walker) Choice offered to:  Patient  DME Arranged:  3-N-1 DME Agency:  [place]:  PT [place] Housing:  [place]  Status of Service:  Completed, signed off  If discussed at [place] of Stay Meetings, dates discussed:    Additional Comments:  Dessa Phi, RN 05/22/2016, 12:10 PM

## 2016-05-22 NOTE — Progress Notes (Signed)
Physical Therapy Treatment Patient Details Name: Steven Cain MRN: [id number] DOB: 1930-11-11 Today's Date: 05/22/2016    History of Present Illness Pt is an 82 year old male s/p L DA THA; h/o spinal stenosis    PT Comments    POD # 3 second session to address stair training with spouse Assisted with 4 steps up backward with walker and spouse.  When pt got to the top, he began to c/o of weakness hovering walker.  Chair brought to pt from behind.  Vitals taken all WNL.  Fatigue due to excursion.  Returned to room in recliner.  Spouse stated she will have another person to assist pt into home.    Follow Up Recommendations  Home health PT;Supervision/Assistance - 24 hour     Equipment Recommendations  Rolling walker with 5" wheels    Recommendations for Other Services       Precautions / Restrictions Precautions Precautions: Fall Restrictions Weight Bearing Restrictions: No Other Position/Activity Restrictions: WBAT    Mobility  Bed Mobility Overal bed mobility: Needs Assistance Bed Mobility: Supine to Sit     Supine to sit: Min assist     General bed mobility comments: Pt OOB in recliner  Transfers                    Ambulation/Gait Ambulation/Gait assistance: Min guard;Supervision Ambulation Distance (Feet): 12 Feet Assistive device: Rolling walker (2 wheeled) Gait Pattern/deviations: Step-to pattern;Step-through pattern Gait velocity: decr   General Gait Details: treatment session focused on stairs   Stairs Stairs: Yes Stairs assistance: Min assist Stair Management: No rails;Backwards;With walker Number of Stairs: 4 General stair comments: spouse present to assist with the RW.     Performed 4 steps when pt began to c/o feeling weak and hot.    Wheelchair Mobility    Modified Rankin (Stroke Patients Only)       Balance                                    Cognition Arousal/Alertness: Awake/alert Behavior During Therapy:  WFL for tasks assessed/performed                        Exercises      General Comments        Pertinent Vitals/Pain Pain Assessment: 0-10 Pain Score: 8  Pain Location: L hip Pain Descriptors / Indicators: Discomfort;Grimacing;Sore Pain Intervention(s): Monitored during session;Repositioned;Ice applied    Home Living                      Prior Function            PT Goals (current goals can now be found in the care plan section) Progress towards PT goals: Progressing toward goals    Frequency    7X/week      PT Plan Current plan remains appropriate    Co-evaluation             End of Session Equipment Utilized During Treatment: Gait belt Activity Tolerance: Patient tolerated treatment well Patient left: in chair;with call bell/phone within reach;with chair alarm set     Time: 1050-1116 PT Time Calculation (min) (ACUTE ONLY): 26 min  Charges:  $Gait Training: 8-22 mins $Therapeutic Activity: 8-22 mins                    G  Codes:      Rica Koyanagi  PTA [facility]  Acute  Rehab Pager      [phone]

## 2016-05-22 NOTE — Progress Notes (Signed)
Physical Therapy Treatment Patient Details Name: Steven Cain MRN: [id number] DOB: May 21, 1931 Today's Date: 05/22/2016    History of Present Illness Pt is an 82 year old male s/p L DA THA; h/o spinal stenosis    PT Comments    POD # 3 am session demonstrated and instructed pt how to use a belt to assist L LE off bed.  Assisted with amb a greater distance and then performed some TE's followed by ICE.    Follow Up Recommendations  Home health PT;Supervision/Assistance - 24 hour     Equipment Recommendations  Rolling walker with 5" wheels    Recommendations for Other Services       Precautions / Restrictions Precautions Precautions: Fall Restrictions Weight Bearing Restrictions: No Other Position/Activity Restrictions: WBAT    Mobility  Bed Mobility Overal bed mobility: Needs Assistance Bed Mobility: Supine to Sit     Supine to sit: Min assist     General bed mobility comments: assist L LE and increased time  Transfers    MinGuard Assist with 25% VC's on proper hand placement and safety with turns.                  Ambulation/Gait Ambulation/Gait assistance: Min guard;Supervision Ambulation Distance (Feet): 52 Feet Assistive device: Rolling walker (2 wheeled) Gait Pattern/deviations: Step-to pattern;Step-through pattern Gait velocity: decr   General Gait Details: verbal cues for sequence, step length, RW poistioning, posture,    Stairs            Wheelchair Mobility    Modified Rankin (Stroke Patients Only)       Balance                                    Cognition Arousal/Alertness: Awake/alert Behavior During Therapy: WFL for tasks assessed/performed                        Exercises      General Comments        Pertinent Vitals/Pain Pain Assessment: 0-10 Pain Score: 8  Pain Location: L hip Pain Descriptors / Indicators: Discomfort;Grimacing;Sore Pain Intervention(s): Monitored during  session;Repositioned;Ice applied    Home Living                      Prior Function            PT Goals (current goals can now be found in the care plan section) Progress towards PT goals: Progressing toward goals    Frequency    7X/week      PT Plan Current plan remains appropriate    Co-evaluation             End of Session Equipment Utilized During Treatment: Gait belt Activity Tolerance: Patient tolerated treatment well Patient left: in chair;with call bell/phone within reach;with chair alarm set     Time: 0933-1000 PT Time Calculation (min) (ACUTE ONLY): 27 min  Charges:  $Gait Training: 8-22 mins $Therapeutic Exercise: 8-22 mins                    G Codes:      Rica Koyanagi  PTA [facility]  Acute  Rehab Pager      [phone]

## 2016-05-23 DIAGNOSIS — M48061 Spinal stenosis, lumbar region without neurogenic claudication: Secondary | ICD-10-CM | POA: Diagnosis not present

## 2016-05-23 DIAGNOSIS — M150 Primary generalized (osteo)arthritis: Secondary | ICD-10-CM | POA: Diagnosis not present

## 2016-05-23 DIAGNOSIS — Z471 Aftercare following joint replacement surgery: Secondary | ICD-10-CM | POA: Diagnosis not present

## 2016-05-23 DIAGNOSIS — Z87440 Personal history of urinary (tract) infections: Secondary | ICD-10-CM | POA: Diagnosis not present

## 2016-05-23 DIAGNOSIS — F329 Major depressive disorder, single episode, unspecified: Secondary | ICD-10-CM | POA: Diagnosis not present

## 2016-05-23 DIAGNOSIS — I129 Hypertensive chronic kidney disease with stage 1 through stage 4 chronic kidney disease, or unspecified chronic kidney disease: Secondary | ICD-10-CM | POA: Diagnosis not present

## 2016-05-23 DIAGNOSIS — Z96641 Presence of right artificial hip joint: Secondary | ICD-10-CM | POA: Diagnosis not present

## 2016-05-23 DIAGNOSIS — N183 Chronic kidney disease, stage 3 (moderate): Secondary | ICD-10-CM | POA: Diagnosis not present

## 2016-05-25 DIAGNOSIS — I129 Hypertensive chronic kidney disease with stage 1 through stage 4 chronic kidney disease, or unspecified chronic kidney disease: Secondary | ICD-10-CM | POA: Diagnosis not present

## 2016-05-25 DIAGNOSIS — M48061 Spinal stenosis, lumbar region without neurogenic claudication: Secondary | ICD-10-CM | POA: Diagnosis not present

## 2016-05-25 DIAGNOSIS — Z471 Aftercare following joint replacement surgery: Secondary | ICD-10-CM | POA: Diagnosis not present

## 2016-05-25 DIAGNOSIS — Z96641 Presence of right artificial hip joint: Secondary | ICD-10-CM | POA: Diagnosis not present

## 2016-05-25 DIAGNOSIS — M150 Primary generalized (osteo)arthritis: Secondary | ICD-10-CM | POA: Diagnosis not present

## 2016-05-25 DIAGNOSIS — N183 Chronic kidney disease, stage 3 (moderate): Secondary | ICD-10-CM | POA: Diagnosis not present

## 2016-05-29 DIAGNOSIS — M48061 Spinal stenosis, lumbar region without neurogenic claudication: Secondary | ICD-10-CM | POA: Diagnosis not present

## 2016-05-29 DIAGNOSIS — N183 Chronic kidney disease, stage 3 (moderate): Secondary | ICD-10-CM | POA: Diagnosis not present

## 2016-05-29 DIAGNOSIS — Z471 Aftercare following joint replacement surgery: Secondary | ICD-10-CM | POA: Diagnosis not present

## 2016-05-29 DIAGNOSIS — Z96641 Presence of right artificial hip joint: Secondary | ICD-10-CM | POA: Diagnosis not present

## 2016-05-29 DIAGNOSIS — M150 Primary generalized (osteo)arthritis: Secondary | ICD-10-CM | POA: Diagnosis not present

## 2016-05-29 DIAGNOSIS — I129 Hypertensive chronic kidney disease with stage 1 through stage 4 chronic kidney disease, or unspecified chronic kidney disease: Secondary | ICD-10-CM | POA: Diagnosis not present

## 2016-05-31 DIAGNOSIS — N183 Chronic kidney disease, stage 3 (moderate): Secondary | ICD-10-CM | POA: Diagnosis not present

## 2016-05-31 DIAGNOSIS — M150 Primary generalized (osteo)arthritis: Secondary | ICD-10-CM | POA: Diagnosis not present

## 2016-05-31 DIAGNOSIS — Z471 Aftercare following joint replacement surgery: Secondary | ICD-10-CM | POA: Diagnosis not present

## 2016-05-31 DIAGNOSIS — M48061 Spinal stenosis, lumbar region without neurogenic claudication: Secondary | ICD-10-CM | POA: Diagnosis not present

## 2016-05-31 DIAGNOSIS — Z96641 Presence of right artificial hip joint: Secondary | ICD-10-CM | POA: Diagnosis not present

## 2016-05-31 DIAGNOSIS — I129 Hypertensive chronic kidney disease with stage 1 through stage 4 chronic kidney disease, or unspecified chronic kidney disease: Secondary | ICD-10-CM | POA: Diagnosis not present

## 2016-06-01 DIAGNOSIS — R600 Localized edema: Secondary | ICD-10-CM | POA: Diagnosis not present

## 2016-06-01 DIAGNOSIS — N183 Chronic kidney disease, stage 3 (moderate): Secondary | ICD-10-CM | POA: Diagnosis not present

## 2016-06-01 DIAGNOSIS — Z6834 Body mass index (BMI) 34.0-34.9, adult: Secondary | ICD-10-CM | POA: Diagnosis not present

## 2016-06-01 DIAGNOSIS — I10 Essential (primary) hypertension: Secondary | ICD-10-CM | POA: Diagnosis not present

## 2016-06-05 ENCOUNTER — Ambulatory Visit (INDEPENDENT_AMBULATORY_CARE_PROVIDER_SITE_OTHER): Payer: Medicare Other | Admitting: Orthopaedic Surgery

## 2016-06-05 ENCOUNTER — Encounter (INDEPENDENT_AMBULATORY_CARE_PROVIDER_SITE_OTHER): Payer: Self-pay | Admitting: Orthopaedic Surgery

## 2016-06-05 DIAGNOSIS — Z96642 Presence of left artificial hip joint: Secondary | ICD-10-CM

## 2016-06-05 NOTE — Progress Notes (Signed)
Office Visit Note   Patient: Steven Cain           Date of Birth: 1930-09-16           MRN: [id number] Visit Date: 06/05/2016              Requested by: Shon Baton, MD [address] [place], [place] [address] PCP: Precious Reel, MD   Assessment & Plan: Visit Diagnoses:  1. Status post total replacement of left hip     Plan: Doing well.  Stop aspirin (go back to baby aspirin)          Follow-Up Instructions: Return in about 4 weeks (around 07/03/2016).   Orders:  No orders of the defined types were placed in this encounter.  Meds ordered this encounter  Medications  . furosemide (LASIX) 20 MG tablet  . KLOR-CON M20 20 MEQ tablet      Procedures: No procedures performed   Clinical Data: No additional findings.   Subjective: Chief Complaint  Patient presents with  . Left Hip - Routine Post Op    Ambulates with walker, doing well. Quite a bit of swelling in leg. Taking Lasix from [place]. Thinks ROM and strength are getting better.    HPI  Review of Systems   Objective: Vital Signs: There were no vitals taken for this visit.  Physical Exam  Musculoskeletal:       Left hip: He exhibits swelling.       Legs:   Ortho Exam  Specialty Comments:  No specialty comments available.  Imaging: No results found.   PMFS History: Patient Active Problem List   Diagnosis Date Noted  . Osteoarthritis of left hip 05/19/2016  . Status post left hip replacement 05/19/2016  . CKD (chronic kidney disease) stage 3, GFR 30-59 ml/min 09/27/2014  . Anemia associated with acute blood loss 09/27/2014  . Orthostatic dizziness 09/27/2014  . GI bleed 09/27/2014  . Acute pyelonephritis 05/08/2014    Class: Acute  . Hydronephrosis of left kidney 05/08/2014    Class: Acute  . Chronic kidney disease 05/08/2014    Class: Chronic  . Ureteral mass 05/08/2014    Class: Acute  . UTI (lower urinary tract infection) 04/24/2013  . Chills 04/24/2013   Past Medical History:    Diagnosis Date  . Arthritis    DJD  . Benign enlargement of prostate    frequent UTI, trim of prostate done , with some issues of incontinence now.  . Bronchitis    chronic- usually has phelgm often  . Depression   . Fluid retention in legs    occasional use of diuretic as needed  . GI bleed   . Hard of hearing    hear more on left side- no hearing aid  . History of transfusion   . Hypertension   . Perforated, severe stomach ulcer ([place])   . Spinal stenosis   . Transfusion history    none recent  . Ureteral stricture    CHRONIC    History reviewed. No pertinent family history.  Past Surgical History:  Procedure Laterality Date  . ABDOMINAL SURGERY     bilroths tube '74  . COLONOSCOPY N/A 09/29/2014   Procedure: COLONOSCOPY;  Surgeon: Beryle Beams, MD;  Location: [facility];  Service: Endoscopy;  Laterality: N/A;  . CYSTOSCOPY W/ URETERAL STENT PLACEMENT Left 05/06/2014   Procedure: CYSTOSCOPY WITH RETROGRADE PYELOGRAM/URETERAL STENT PLACEMENT;  Surgeon: [name], MD;  Location: [facility];  Service: Urology;  Laterality: Left;  . CYSTOSCOPY W/ URETERAL STENT PLACEMENT Left 03/31/2015   Procedure: CYSTOSCOPY WITH LEFT RETROGRADE PYELOGRAM/URETERAL STENT EXCHANGE;  Surgeon: [name], MD;  [facility]: [facility];  Service: Urology;  Laterality: Left;  . CYSTOSCOPY W/ URETERAL STENT PLACEMENT Left 09/15/2015   Procedure: CYSTOSCOPY WITH LEFT RETROGRADE PYELOGRAM/URETERAL STENT EXCHANGE ;  Surgeon: [name], MD;  [facility];  Service: Urology;  Laterality: Left;  . CYSTOSCOPY W/ URETERAL STENT PLACEMENT Left 04/28/2016   Procedure: CYSTOSCOPY WITH RETROGRADE PYELOGRAM/URETERAL STENT EXCHANGE;  Surgeon: [name], MD;  [facility];  Service: Urology;  Laterality: Left;  . CYSTOSCOPY WITH RETROGRADE PYELOGRAM, URETEROSCOPY AND STENT PLACEMENT Left 10/21/2014   Procedure: CYSTOSCOPY WITH RETROGRADE PYELOGRAM, URETEROSCOPY,BIOPSY AND STENT CHANGE;  Surgeon: [name], MD;  Location[facility];  Service: Urology;  Laterality: Left;  . CYSTOSCOPY/RETROGRADE/URETEROSCOPY Left 06/03/2014   Procedure: CYSTOSCOPY/RETROGRADE/URETEROSCOPY WITH BIOPSY,STENT EXCHANGE;  Surgeon: [name], MD;  [facility];  Service: Urology;  Laterality: Left;  . ESOPHAGOGASTRODUODENOSCOPY N/A 09/28/2014   Procedure: ESOPHAGOGASTRODUODENOSCOPY (EGD);  Surgeon: Beryle Beams, MD;  Location: [facility];  Service: Endoscopy;  Laterality: N/A;  . GASTRIC ROUX-EN-Y     '74  . HERNIA REPAIR     bilateral hernia repairs   . TONSILLECTOMY    . TOTAL HIP ARTHROPLASTY Left 05/19/2016   Procedure: LEFT TOTAL HIP ARTHROPLASTY ANTERIOR APPROACH;  Surgeon: Mcarthur Rossetti, MD;  Location: [facility];  Service: Orthopedics;  Laterality: Left;  . TRANSURETHRAL RESECTION OF PROSTATE     Social History   Occupational History  . Not on file.   Social History Main Topics  . Smoking status: Never Smoker  . Smokeless tobacco: Former Systems developer    Types: Chew  . Alcohol use No  . Drug use: No  . Sexual activity: Not on file

## 2016-06-06 DIAGNOSIS — Z96641 Presence of right artificial hip joint: Secondary | ICD-10-CM | POA: Diagnosis not present

## 2016-06-06 DIAGNOSIS — I129 Hypertensive chronic kidney disease with stage 1 through stage 4 chronic kidney disease, or unspecified chronic kidney disease: Secondary | ICD-10-CM | POA: Diagnosis not present

## 2016-06-06 DIAGNOSIS — M48061 Spinal stenosis, lumbar region without neurogenic claudication: Secondary | ICD-10-CM | POA: Diagnosis not present

## 2016-06-06 DIAGNOSIS — N183 Chronic kidney disease, stage 3 (moderate): Secondary | ICD-10-CM | POA: Diagnosis not present

## 2016-06-06 DIAGNOSIS — Z471 Aftercare following joint replacement surgery: Secondary | ICD-10-CM | POA: Diagnosis not present

## 2016-06-06 DIAGNOSIS — M150 Primary generalized (osteo)arthritis: Secondary | ICD-10-CM | POA: Diagnosis not present

## 2016-06-07 DIAGNOSIS — Z96641 Presence of right artificial hip joint: Secondary | ICD-10-CM | POA: Diagnosis not present

## 2016-06-07 DIAGNOSIS — M48061 Spinal stenosis, lumbar region without neurogenic claudication: Secondary | ICD-10-CM | POA: Diagnosis not present

## 2016-06-07 DIAGNOSIS — Z471 Aftercare following joint replacement surgery: Secondary | ICD-10-CM | POA: Diagnosis not present

## 2016-06-07 DIAGNOSIS — I129 Hypertensive chronic kidney disease with stage 1 through stage 4 chronic kidney disease, or unspecified chronic kidney disease: Secondary | ICD-10-CM | POA: Diagnosis not present

## 2016-06-07 DIAGNOSIS — N183 Chronic kidney disease, stage 3 (moderate): Secondary | ICD-10-CM | POA: Diagnosis not present

## 2016-06-07 DIAGNOSIS — M150 Primary generalized (osteo)arthritis: Secondary | ICD-10-CM | POA: Diagnosis not present

## 2016-06-12 DIAGNOSIS — Z471 Aftercare following joint replacement surgery: Secondary | ICD-10-CM | POA: Diagnosis not present

## 2016-06-12 DIAGNOSIS — N183 Chronic kidney disease, stage 3 (moderate): Secondary | ICD-10-CM | POA: Diagnosis not present

## 2016-06-12 DIAGNOSIS — I129 Hypertensive chronic kidney disease with stage 1 through stage 4 chronic kidney disease, or unspecified chronic kidney disease: Secondary | ICD-10-CM | POA: Diagnosis not present

## 2016-06-12 DIAGNOSIS — M150 Primary generalized (osteo)arthritis: Secondary | ICD-10-CM | POA: Diagnosis not present

## 2016-06-12 DIAGNOSIS — Z96641 Presence of right artificial hip joint: Secondary | ICD-10-CM | POA: Diagnosis not present

## 2016-06-12 DIAGNOSIS — M48061 Spinal stenosis, lumbar region without neurogenic claudication: Secondary | ICD-10-CM | POA: Diagnosis not present

## 2016-06-14 DIAGNOSIS — I129 Hypertensive chronic kidney disease with stage 1 through stage 4 chronic kidney disease, or unspecified chronic kidney disease: Secondary | ICD-10-CM | POA: Diagnosis not present

## 2016-06-14 DIAGNOSIS — Z471 Aftercare following joint replacement surgery: Secondary | ICD-10-CM | POA: Diagnosis not present

## 2016-06-14 DIAGNOSIS — M150 Primary generalized (osteo)arthritis: Secondary | ICD-10-CM | POA: Diagnosis not present

## 2016-06-14 DIAGNOSIS — Z96641 Presence of right artificial hip joint: Secondary | ICD-10-CM | POA: Diagnosis not present

## 2016-06-14 DIAGNOSIS — M48061 Spinal stenosis, lumbar region without neurogenic claudication: Secondary | ICD-10-CM | POA: Diagnosis not present

## 2016-06-14 DIAGNOSIS — N183 Chronic kidney disease, stage 3 (moderate): Secondary | ICD-10-CM | POA: Diagnosis not present

## 2016-06-19 DIAGNOSIS — M150 Primary generalized (osteo)arthritis: Secondary | ICD-10-CM | POA: Diagnosis not present

## 2016-06-19 DIAGNOSIS — Z471 Aftercare following joint replacement surgery: Secondary | ICD-10-CM | POA: Diagnosis not present

## 2016-06-19 DIAGNOSIS — N183 Chronic kidney disease, stage 3 (moderate): Secondary | ICD-10-CM | POA: Diagnosis not present

## 2016-06-19 DIAGNOSIS — I129 Hypertensive chronic kidney disease with stage 1 through stage 4 chronic kidney disease, or unspecified chronic kidney disease: Secondary | ICD-10-CM | POA: Diagnosis not present

## 2016-06-19 DIAGNOSIS — Z96641 Presence of right artificial hip joint: Secondary | ICD-10-CM | POA: Diagnosis not present

## 2016-06-19 DIAGNOSIS — M48061 Spinal stenosis, lumbar region without neurogenic claudication: Secondary | ICD-10-CM | POA: Diagnosis not present

## 2016-06-21 DIAGNOSIS — I129 Hypertensive chronic kidney disease with stage 1 through stage 4 chronic kidney disease, or unspecified chronic kidney disease: Secondary | ICD-10-CM | POA: Diagnosis not present

## 2016-06-21 DIAGNOSIS — Z96641 Presence of right artificial hip joint: Secondary | ICD-10-CM | POA: Diagnosis not present

## 2016-06-21 DIAGNOSIS — N183 Chronic kidney disease, stage 3 (moderate): Secondary | ICD-10-CM | POA: Diagnosis not present

## 2016-06-21 DIAGNOSIS — M48061 Spinal stenosis, lumbar region without neurogenic claudication: Secondary | ICD-10-CM | POA: Diagnosis not present

## 2016-06-21 DIAGNOSIS — M150 Primary generalized (osteo)arthritis: Secondary | ICD-10-CM | POA: Diagnosis not present

## 2016-06-21 DIAGNOSIS — Z471 Aftercare following joint replacement surgery: Secondary | ICD-10-CM | POA: Diagnosis not present

## 2016-06-27 DIAGNOSIS — Z471 Aftercare following joint replacement surgery: Secondary | ICD-10-CM | POA: Diagnosis not present

## 2016-06-27 DIAGNOSIS — M150 Primary generalized (osteo)arthritis: Secondary | ICD-10-CM | POA: Diagnosis not present

## 2016-06-27 DIAGNOSIS — I129 Hypertensive chronic kidney disease with stage 1 through stage 4 chronic kidney disease, or unspecified chronic kidney disease: Secondary | ICD-10-CM | POA: Diagnosis not present

## 2016-06-27 DIAGNOSIS — Z96641 Presence of right artificial hip joint: Secondary | ICD-10-CM | POA: Diagnosis not present

## 2016-06-27 DIAGNOSIS — N183 Chronic kidney disease, stage 3 (moderate): Secondary | ICD-10-CM | POA: Diagnosis not present

## 2016-06-27 DIAGNOSIS — M48061 Spinal stenosis, lumbar region without neurogenic claudication: Secondary | ICD-10-CM | POA: Diagnosis not present

## 2016-06-28 DIAGNOSIS — I10 Essential (primary) hypertension: Secondary | ICD-10-CM | POA: Diagnosis not present

## 2016-06-28 DIAGNOSIS — K921 Melena: Secondary | ICD-10-CM | POA: Diagnosis not present

## 2016-06-28 DIAGNOSIS — R600 Localized edema: Secondary | ICD-10-CM | POA: Diagnosis not present

## 2016-06-28 DIAGNOSIS — N183 Chronic kidney disease, stage 3 (moderate): Secondary | ICD-10-CM | POA: Diagnosis not present

## 2016-06-28 DIAGNOSIS — I878 Other specified disorders of veins: Secondary | ICD-10-CM | POA: Diagnosis not present

## 2016-06-29 DIAGNOSIS — M150 Primary generalized (osteo)arthritis: Secondary | ICD-10-CM | POA: Diagnosis not present

## 2016-06-29 DIAGNOSIS — Z471 Aftercare following joint replacement surgery: Secondary | ICD-10-CM | POA: Diagnosis not present

## 2016-06-29 DIAGNOSIS — M48061 Spinal stenosis, lumbar region without neurogenic claudication: Secondary | ICD-10-CM | POA: Diagnosis not present

## 2016-06-29 DIAGNOSIS — Z96641 Presence of right artificial hip joint: Secondary | ICD-10-CM | POA: Diagnosis not present

## 2016-06-29 DIAGNOSIS — I129 Hypertensive chronic kidney disease with stage 1 through stage 4 chronic kidney disease, or unspecified chronic kidney disease: Secondary | ICD-10-CM | POA: Diagnosis not present

## 2016-06-29 DIAGNOSIS — N183 Chronic kidney disease, stage 3 (moderate): Secondary | ICD-10-CM | POA: Diagnosis not present

## 2016-07-03 ENCOUNTER — Ambulatory Visit (INDEPENDENT_AMBULATORY_CARE_PROVIDER_SITE_OTHER): Payer: Medicare Other | Admitting: Orthopaedic Surgery

## 2016-07-03 DIAGNOSIS — Z96642 Presence of left artificial hip joint: Secondary | ICD-10-CM

## 2016-07-03 NOTE — Progress Notes (Signed)
Steven Cain is doing well postoperatively. He is 6 weeks status post a left total hip replacement through direct anterior approach. He has no complaints is ambulating with a cane. He is off all pain medications and feels like he is ready to drive.  On examination of his right hip he has fluid range of motion actively and passively of his hip with no issues at all. His incisions well-healed and there is no seroma.  At this point I'll need to see him back for his hip for 6 months. However he 7 some back issues. I like to see him back in about 2 months from now and I would like to AP and lateral of his lumbar spine.

## 2016-07-29 ENCOUNTER — Other Ambulatory Visit (INDEPENDENT_AMBULATORY_CARE_PROVIDER_SITE_OTHER): Payer: Self-pay | Admitting: Orthopaedic Surgery

## 2016-07-31 NOTE — Telephone Encounter (Signed)
Please advise 

## 2016-08-14 DIAGNOSIS — N1330 Unspecified hydronephrosis: Secondary | ICD-10-CM

## 2016-08-14 HISTORY — DX: Unspecified hydronephrosis: N13.30

## 2016-09-05 ENCOUNTER — Ambulatory Visit (INDEPENDENT_AMBULATORY_CARE_PROVIDER_SITE_OTHER): Payer: Medicare Other | Admitting: Orthopaedic Surgery

## 2016-09-20 DIAGNOSIS — I10 Essential (primary) hypertension: Secondary | ICD-10-CM | POA: Diagnosis not present

## 2016-09-20 DIAGNOSIS — Z125 Encounter for screening for malignant neoplasm of prostate: Secondary | ICD-10-CM | POA: Diagnosis not present

## 2016-09-20 DIAGNOSIS — E538 Deficiency of other specified B group vitamins: Secondary | ICD-10-CM | POA: Diagnosis not present

## 2016-09-20 DIAGNOSIS — M859 Disorder of bone density and structure, unspecified: Secondary | ICD-10-CM | POA: Diagnosis not present

## 2016-09-20 DIAGNOSIS — R7309 Other abnormal glucose: Secondary | ICD-10-CM | POA: Diagnosis not present

## 2016-09-22 DIAGNOSIS — I129 Hypertensive chronic kidney disease with stage 1 through stage 4 chronic kidney disease, or unspecified chronic kidney disease: Secondary | ICD-10-CM | POA: Diagnosis not present

## 2016-09-22 DIAGNOSIS — F325 Major depressive disorder, single episode, in full remission: Secondary | ICD-10-CM | POA: Diagnosis not present

## 2016-09-22 DIAGNOSIS — R7309 Other abnormal glucose: Secondary | ICD-10-CM | POA: Diagnosis not present

## 2016-09-22 DIAGNOSIS — N183 Chronic kidney disease, stage 3 (moderate): Secondary | ICD-10-CM | POA: Diagnosis not present

## 2016-09-22 DIAGNOSIS — R972 Elevated prostate specific antigen [PSA]: Secondary | ICD-10-CM | POA: Diagnosis not present

## 2016-09-22 DIAGNOSIS — R8299 Other abnormal findings in urine: Secondary | ICD-10-CM | POA: Diagnosis not present

## 2016-09-22 DIAGNOSIS — R600 Localized edema: Secondary | ICD-10-CM | POA: Diagnosis not present

## 2016-09-22 DIAGNOSIS — Z6834 Body mass index (BMI) 34.0-34.9, adult: Secondary | ICD-10-CM | POA: Diagnosis not present

## 2016-09-22 DIAGNOSIS — I878 Other specified disorders of veins: Secondary | ICD-10-CM | POA: Diagnosis not present

## 2016-09-22 DIAGNOSIS — D692 Other nonthrombocytopenic purpura: Secondary | ICD-10-CM | POA: Diagnosis not present

## 2016-09-22 DIAGNOSIS — Z0000 Encounter for general adult medical examination without abnormal findings: Secondary | ICD-10-CM | POA: Diagnosis not present

## 2016-09-22 DIAGNOSIS — Z1389 Encounter for screening for other disorder: Secondary | ICD-10-CM | POA: Diagnosis not present

## 2016-09-25 DIAGNOSIS — Z1212 Encounter for screening for malignant neoplasm of rectum: Secondary | ICD-10-CM | POA: Diagnosis not present

## 2016-10-03 DIAGNOSIS — N3000 Acute cystitis without hematuria: Secondary | ICD-10-CM | POA: Diagnosis not present

## 2016-10-03 DIAGNOSIS — N135 Crossing vessel and stricture of ureter without hydronephrosis: Secondary | ICD-10-CM | POA: Diagnosis not present

## 2016-10-12 ENCOUNTER — Other Ambulatory Visit: Payer: Self-pay | Admitting: Urology

## 2016-11-02 NOTE — Patient Instructions (Addendum)
Steven Cain  11/02/2016   Your procedure is scheduled on: 11/08/16  Report to [facility]  Entrance take [facility]  elevators to 3rd floor to  [name] at     3:00PM.  Call this number if you have problems the morning of surgery [phone]   Remember: ONLY 1 PERSON MAY GO WITH YOU TO SHORT STAY TO GET  READY MORNING OF YOUR SURGERY.  Do not eat food  :After Midnight.   YOU MAY HAVE CLEAR LIQUIDS UNTIL 1100 AM THEN NOTHING BY MOUTH    CLEAR LIQUID DIET   Foods Allowed                                                                     Foods Excluded  Coffee and tea, regular and decaf                             liquids that you cannot  Plain Jell-O in any flavor                                             see through such as: Fruit ices (not with fruit pulp)                                     milk, soups, orange juice  Iced Popsicles                                    All solid food Carbonated beverages, regular and diet                                    Cranberry, grape and apple juices Sports drinks like Gatorade Lightly seasoned clear broth or consume(fat free) Sugar, honey syrup  Sample Menu Breakfast                                Lunch                                     Supper Cranberry juice                    Beef broth                            Chicken broth Jell-O                                     Grape juice  Apple juice Coffee or tea                        Jell-O                                      Popsicle                                                Coffee or tea                        Coffee or tea  _____________________________________________________________________     Take these medicines the morning of surgery with A SIP OF WATER: Paxil                                You may not have any metal on your body including hair pins and              piercings  Do not wear jewelry, make-up,  lotions, powders or perfumes, deodorant             Do not wear nail polish.  Do not shave  48 hours prior to surgery.              Men may shave face and neck.   Do not bring valuables to the hospital. [place].  Contacts, dentures or bridgework may not be worn into surgery.  Leave suitcase in the car. After surgery it may be brought to your room.     Patients discharged the day of surgery will not be allowed to drive home.  Name and phone number of your driver:  Special Instructions: N/A              Please read over the following fact sheets you were given: _____________________________________________________________________             [facility] - Preparing for Surgery Before surgery, you can play an important role.  Because skin is not sterile, your skin needs to be as free of germs as possible.  You can reduce the number of germs on your skin by washing with CHG (chlorahexidine gluconate) soap before surgery.  CHG is an antiseptic cleaner which kills germs and bonds with the skin to continue killing germs even after washing. Please DO NOT use if you have an allergy to CHG or antibacterial soaps.  If your skin becomes reddened/irritated stop using the CHG and inform your nurse when you arrive at Short Stay. Do not shave (including legs and underarms) for at least 48 hours prior to the first CHG shower.  You may shave your face/neck. Please follow these instructions carefully:  1.  Shower with CHG Soap the night before surgery and the  morning of Surgery.  2.  If you choose to wash your hair, wash your hair first as usual with your  normal  shampoo.  3.  After you shampoo, rinse your hair and body thoroughly to remove the  shampoo.  4.  Use CHG as you would any other liquid soap.  You can apply chg directly  to the skin and wash                       Gently with a scrungie or clean washcloth.  5.  Apply the  CHG Soap to your body ONLY FROM THE NECK DOWN.   Do not use on face/ open                           Wound or open sores. Avoid contact with eyes, ears mouth and genitals (private parts).                       Wash face,  Genitals (private parts) with your normal soap.             6.  Wash thoroughly, paying special attention to the area where your surgery  will be performed.  7.  Thoroughly rinse your body with warm water from the neck down.  8.  DO NOT shower/wash with your normal soap after using and rinsing off  the CHG Soap.                9.  Pat yourself dry with a clean towel.            10.  Wear clean pajamas.            11.  Place clean sheets on your bed the night of your first shower and do not  sleep with pets. Day of Surgery : Do not apply any lotions/deodorants the morning of surgery.  Please wear clean clothes to the hospital/surgery center.  FAILURE TO FOLLOW THESE INSTRUCTIONS MAY RESULT IN THE CANCELLATION OF YOUR SURGERY PATIENT SIGNATURE_________________________________  NURSE SIGNATURE__________________________________  ________________________________________________________________________

## 2016-11-06 ENCOUNTER — Encounter (HOSPITAL_COMMUNITY): Payer: Self-pay

## 2016-11-06 ENCOUNTER — Encounter (HOSPITAL_COMMUNITY)
Admission: RE | Admit: 2016-11-06 | Discharge: 2016-11-06 | Disposition: A | Discharge status: Home / Self Care | Payer: Medicare Other | Source: Ambulatory Visit | Attending: Urology | Admitting: Urology

## 2016-11-06 ENCOUNTER — Encounter (INDEPENDENT_AMBULATORY_CARE_PROVIDER_SITE_OTHER): Payer: Self-pay

## 2016-11-06 DIAGNOSIS — R609 Edema, unspecified: Secondary | ICD-10-CM | POA: Diagnosis not present

## 2016-11-06 DIAGNOSIS — F419 Anxiety disorder, unspecified: Secondary | ICD-10-CM | POA: Diagnosis not present

## 2016-11-06 DIAGNOSIS — M4800 Spinal stenosis, site unspecified: Secondary | ICD-10-CM | POA: Diagnosis not present

## 2016-11-06 DIAGNOSIS — J42 Unspecified chronic bronchitis: Secondary | ICD-10-CM | POA: Diagnosis not present

## 2016-11-06 DIAGNOSIS — R0600 Dyspnea, unspecified: Secondary | ICD-10-CM | POA: Diagnosis not present

## 2016-11-06 DIAGNOSIS — R32 Unspecified urinary incontinence: Secondary | ICD-10-CM | POA: Diagnosis not present

## 2016-11-06 DIAGNOSIS — I10 Essential (primary) hypertension: Secondary | ICD-10-CM | POA: Diagnosis not present

## 2016-11-06 DIAGNOSIS — Z720 Tobacco use: Secondary | ICD-10-CM | POA: Diagnosis not present

## 2016-11-06 DIAGNOSIS — N131 Hydronephrosis with ureteral stricture, not elsewhere classified: Secondary | ICD-10-CM | POA: Diagnosis not present

## 2016-11-06 DIAGNOSIS — Z9889 Other specified postprocedural states: Secondary | ICD-10-CM | POA: Diagnosis not present

## 2016-11-06 DIAGNOSIS — F329 Major depressive disorder, single episode, unspecified: Secondary | ICD-10-CM | POA: Diagnosis not present

## 2016-11-06 DIAGNOSIS — M1990 Unspecified osteoarthritis, unspecified site: Secondary | ICD-10-CM | POA: Diagnosis not present

## 2016-11-06 DIAGNOSIS — Z87440 Personal history of urinary (tract) infections: Secondary | ICD-10-CM | POA: Diagnosis not present

## 2016-11-06 DIAGNOSIS — N401 Enlarged prostate with lower urinary tract symptoms: Secondary | ICD-10-CM | POA: Diagnosis not present

## 2016-11-06 DIAGNOSIS — Z8719 Personal history of other diseases of the digestive system: Secondary | ICD-10-CM | POA: Diagnosis not present

## 2016-11-06 DIAGNOSIS — Z96642 Presence of left artificial hip joint: Secondary | ICD-10-CM | POA: Diagnosis not present

## 2016-11-06 DIAGNOSIS — N39498 Other specified urinary incontinence: Secondary | ICD-10-CM | POA: Diagnosis not present

## 2016-11-06 DIAGNOSIS — H9191 Unspecified hearing loss, right ear: Secondary | ICD-10-CM | POA: Diagnosis not present

## 2016-11-06 HISTORY — DX: Urinary tract infection, site not specified: N39.0

## 2016-11-06 HISTORY — DX: Anxiety disorder, unspecified: F41.9

## 2016-11-06 HISTORY — DX: Dyspnea, unspecified: R06.00

## 2016-11-06 LAB — CBC
HEMATOCRIT: 37.6 % — AB (ref 39.0–52.0)
HEMOGLOBIN: 11.9 g/dL — AB (ref 13.0–17.0)
MCH: 27.3 pg (ref 26.0–34.0)
MCHC: 31.6 g/dL (ref 30.0–36.0)
MCV: 86.2 fL (ref 78.0–100.0)
Platelets: 234 10*3/uL (ref 150–400)
RBC: 4.36 MIL/uL (ref 4.22–5.81)
RDW: 16.1 % — ABNORMAL HIGH (ref 11.5–15.5)
WBC: 8.8 10*3/uL (ref 4.0–10.5)

## 2016-11-06 LAB — BASIC METABOLIC PANEL
ANION GAP: 8 (ref 5–15)
BUN: 29 mg/dL — AB (ref 6–20)
CHLORIDE: 105 mmol/L (ref 101–111)
CO2: 26 mmol/L (ref 22–32)
Calcium: 9.4 mg/dL (ref 8.9–10.3)
Creatinine, Ser: 2.38 mg/dL — ABNORMAL HIGH (ref 0.61–1.24)
GFR calc Af Amer: 27 mL/min — ABNORMAL LOW (ref >60)
GFR, EST NON AFRICAN AMERICAN: 24 mL/min — AB (ref >60)
Glucose, Bld: 120 mg/dL — ABNORMAL HIGH (ref 65–99)
POTASSIUM: 4.4 mmol/L (ref 3.5–5.1)
SODIUM: 139 mmol/L (ref 135–145)

## 2016-11-06 NOTE — Progress Notes (Signed)
Bmp routed to Dr. Tresa  via epic done 11/06/16

## 2016-11-08 ENCOUNTER — Ambulatory Visit (HOSPITAL_COMMUNITY)
Admission: RE | Admit: 2016-11-08 | Discharge: 2016-11-08 | Disposition: A | Discharge status: Home / Self Care | Payer: Medicare Other | Source: Ambulatory Visit | Attending: Urology | Admitting: Urology

## 2016-11-08 ENCOUNTER — Ambulatory Visit (HOSPITAL_COMMUNITY): Payer: Medicare Other | Admitting: Certified Registered Nurse Anesthetist

## 2016-11-08 ENCOUNTER — Ambulatory Visit (HOSPITAL_COMMUNITY): Payer: Medicare Other

## 2016-11-08 ENCOUNTER — Encounter (HOSPITAL_COMMUNITY): Payer: Self-pay | Admitting: *Deleted

## 2016-11-08 ENCOUNTER — Encounter (HOSPITAL_COMMUNITY)
Admission: RE | Disposition: A | Discharge status: Home / Self Care | Payer: Self-pay | Source: Ambulatory Visit | Attending: Urology

## 2016-11-08 DIAGNOSIS — N135 Crossing vessel and stricture of ureter without hydronephrosis: Secondary | ICD-10-CM | POA: Diagnosis not present

## 2016-11-08 DIAGNOSIS — Z8719 Personal history of other diseases of the digestive system: Secondary | ICD-10-CM | POA: Insufficient documentation

## 2016-11-08 DIAGNOSIS — R609 Edema, unspecified: Secondary | ICD-10-CM | POA: Insufficient documentation

## 2016-11-08 DIAGNOSIS — N39498 Other specified urinary incontinence: Secondary | ICD-10-CM | POA: Insufficient documentation

## 2016-11-08 DIAGNOSIS — H9191 Unspecified hearing loss, right ear: Secondary | ICD-10-CM | POA: Diagnosis not present

## 2016-11-08 DIAGNOSIS — N183 Chronic kidney disease, stage 3 (moderate): Secondary | ICD-10-CM | POA: Diagnosis not present

## 2016-11-08 DIAGNOSIS — Z96642 Presence of left artificial hip joint: Secondary | ICD-10-CM | POA: Insufficient documentation

## 2016-11-08 DIAGNOSIS — J42 Unspecified chronic bronchitis: Secondary | ICD-10-CM | POA: Insufficient documentation

## 2016-11-08 DIAGNOSIS — I10 Essential (primary) hypertension: Secondary | ICD-10-CM | POA: Diagnosis not present

## 2016-11-08 DIAGNOSIS — N401 Enlarged prostate with lower urinary tract symptoms: Secondary | ICD-10-CM | POA: Insufficient documentation

## 2016-11-08 DIAGNOSIS — R32 Unspecified urinary incontinence: Secondary | ICD-10-CM | POA: Insufficient documentation

## 2016-11-08 DIAGNOSIS — F329 Major depressive disorder, single episode, unspecified: Secondary | ICD-10-CM | POA: Diagnosis not present

## 2016-11-08 DIAGNOSIS — Z87440 Personal history of urinary (tract) infections: Secondary | ICD-10-CM | POA: Insufficient documentation

## 2016-11-08 DIAGNOSIS — R0600 Dyspnea, unspecified: Secondary | ICD-10-CM | POA: Diagnosis not present

## 2016-11-08 DIAGNOSIS — K922 Gastrointestinal hemorrhage, unspecified: Secondary | ICD-10-CM | POA: Diagnosis not present

## 2016-11-08 DIAGNOSIS — M1990 Unspecified osteoarthritis, unspecified site: Secondary | ICD-10-CM | POA: Diagnosis not present

## 2016-11-08 DIAGNOSIS — N1330 Unspecified hydronephrosis: Secondary | ICD-10-CM | POA: Diagnosis not present

## 2016-11-08 DIAGNOSIS — Z419 Encounter for procedure for purposes other than remedying health state, unspecified: Secondary | ICD-10-CM

## 2016-11-08 DIAGNOSIS — F419 Anxiety disorder, unspecified: Secondary | ICD-10-CM | POA: Insufficient documentation

## 2016-11-08 DIAGNOSIS — N390 Urinary tract infection, site not specified: Secondary | ICD-10-CM | POA: Diagnosis not present

## 2016-11-08 DIAGNOSIS — N131 Hydronephrosis with ureteral stricture, not elsewhere classified: Secondary | ICD-10-CM | POA: Insufficient documentation

## 2016-11-08 DIAGNOSIS — M4800 Spinal stenosis, site unspecified: Secondary | ICD-10-CM | POA: Insufficient documentation

## 2016-11-08 DIAGNOSIS — Z9889 Other specified postprocedural states: Secondary | ICD-10-CM | POA: Insufficient documentation

## 2016-11-08 DIAGNOSIS — Z720 Tobacco use: Secondary | ICD-10-CM | POA: Insufficient documentation

## 2016-11-08 HISTORY — PX: CYSTOSCOPY W/ URETERAL STENT PLACEMENT: SHX1429

## 2016-11-08 SURGERY — CYSTOSCOPY, WITH RETROGRADE PYELOGRAM AND URETERAL STENT INSERTION
Anesthesia: General | Site: Ureter | Laterality: Left

## 2016-11-08 MED ORDER — DEXAMETHASONE SODIUM PHOSPHATE 10 MG/ML IJ SOLN
INTRAMUSCULAR | Status: AC
  Filled 2016-11-08: qty 1

## 2016-11-08 MED ORDER — ONDANSETRON HCL 4 MG/2ML IJ SOLN
INTRAMUSCULAR | Status: AC
  Filled 2016-11-08: qty 2

## 2016-11-08 MED ORDER — FENTANYL CITRATE (PF) 100 MCG/2ML IJ SOLN
INTRAMUSCULAR | Status: DC | PRN
  Administered 2016-11-08: 50 ug via INTRAVENOUS

## 2016-11-08 MED ORDER — OXYCODONE HCL 5 MG/5ML PO SOLN
5.0000 mg | Freq: Once | ORAL | Status: DC | PRN
  Filled 2016-11-08: qty 5

## 2016-11-08 MED ORDER — PROPOFOL 10 MG/ML IV BOLUS
INTRAVENOUS | Status: AC
  Filled 2016-11-08: qty 20

## 2016-11-08 MED ORDER — LACTATED RINGERS IV SOLN
INTRAVENOUS | Status: DC | PRN
  Administered 2016-11-08: 16:00:00 via INTRAVENOUS

## 2016-11-08 MED ORDER — GENTAMICIN SULFATE 40 MG/ML IJ SOLN
110.0000 mg | INTRAVENOUS | Status: AC
  Administered 2016-11-08: 110 mg via INTRAVENOUS
  Filled 2016-11-08 (×2): qty 2.75

## 2016-11-08 MED ORDER — MIDAZOLAM HCL 2 MG/2ML IJ SOLN
INTRAMUSCULAR | Status: AC
  Filled 2016-11-08: qty 2

## 2016-11-08 MED ORDER — FENTANYL CITRATE (PF) 100 MCG/2ML IJ SOLN
INTRAMUSCULAR | Status: AC
  Filled 2016-11-08: qty 2

## 2016-11-08 MED ORDER — STERILE WATER FOR INJECTION IJ SOLN
INTRAMUSCULAR | Status: DC | PRN
  Administered 2016-11-08: 3000 mL via SURGICAL_CAVITY

## 2016-11-08 MED ORDER — HYDROMORPHONE HCL 1 MG/ML IJ SOLN: 0.2500 mg | INTRAMUSCULAR | Status: DC | PRN

## 2016-11-08 MED ORDER — PROMETHAZINE HCL 25 MG/ML IJ SOLN: 6.2500 mg | INTRAMUSCULAR | Status: DC | PRN

## 2016-11-08 MED ORDER — IOHEXOL 300 MG/ML  SOLN
INTRAMUSCULAR | Status: DC | PRN
  Administered 2016-11-08: 50 mL

## 2016-11-08 MED ORDER — LIDOCAINE HCL (CARDIAC) 20 MG/ML IV SOLN
INTRAVENOUS | Status: DC | PRN
  Administered 2016-11-08: 80 mg via INTRAVENOUS

## 2016-11-08 MED ORDER — MIDAZOLAM HCL 5 MG/5ML IJ SOLN
INTRAMUSCULAR | Status: DC | PRN
  Administered 2016-11-08 (×2): 0.5 mg via INTRAVENOUS

## 2016-11-08 MED ORDER — OXYCODONE HCL 5 MG PO TABS: 5.0000 mg | ORAL_TABLET | Freq: Once | ORAL | Status: DC | PRN

## 2016-11-08 MED ORDER — LACTATED RINGERS IV SOLN
INTRAVENOUS | Status: DC
  Administered 2016-11-08: 16:00:00 via INTRAVENOUS

## 2016-11-08 MED ORDER — PROPOFOL 10 MG/ML IV BOLUS
INTRAVENOUS | Status: DC | PRN
  Administered 2016-11-08: 175 mg via INTRAVENOUS

## 2016-11-08 MED ORDER — LIDOCAINE 2% (20 MG/ML) 5 ML SYRINGE
INTRAMUSCULAR | Status: AC
  Filled 2016-11-08: qty 5

## 2016-11-08 MED ORDER — HYDROMORPHONE HCL 1 MG/ML IJ SOLN
INTRAMUSCULAR | Status: DC
Start: 2016-11-08 — End: 2016-11-08
  Filled 2016-11-08: qty 1

## 2016-11-08 SURGICAL SUPPLY — 14 items
BAG URO CATCHER STRL LF (MISCELLANEOUS) ×3 IMPLANT
BASKET ZERO TIP NITINOL 2.4FR (BASKET) IMPLANT
BSKT STON RTRVL ZERO TP 2.4FR (BASKET)
CATH INTERMIT  6FR 70CM (CATHETERS) IMPLANT
CLOTH BEACON ORANGE TIMEOUT ST (SAFETY) ×3 IMPLANT
GLOVE BIOGEL M STRL SZ7.5 (GLOVE) ×3 IMPLANT
GOWN STRL REUS W/TWL LRG LVL3 (GOWN DISPOSABLE) ×6 IMPLANT
GUIDEWIRE ANG ZIPWIRE 038X150 (WIRE) IMPLANT
GUIDEWIRE STR DUAL SENSOR (WIRE) ×3 IMPLANT
MANIFOLD NEPTUNE II (INSTRUMENTS) ×3 IMPLANT
PACK CYSTO (CUSTOM PROCEDURE TRAY) ×3 IMPLANT
STENT CONTOUR 7FRX26X.038 (STENTS) ×2 IMPLANT
TUBING CONNECTING 10 (TUBING) ×2 IMPLANT
TUBING CONNECTING 10' (TUBING) ×1

## 2016-11-08 NOTE — Transfer of Care (Signed)
Immediate Anesthesia Transfer of Care Note  Patient: Steven Cain  Procedure(s) Performed: Procedure(s): CYSTOSCOPY WITH RETROGRADE PYELOGRAM/URETERAL STENT EXCHANGE (Left)  Patient Location: PACU  Anesthesia Type:General  Level of Consciousness: awake, oriented, patient cooperative, lethargic and responds to stimulation  Airway & Oxygen Therapy: Patient Spontanous Breathing and Patient connected to face mask oxygen  Post-op Assessment: Report given to RN, Post -op Vital signs reviewed and stable and Patient moving all extremities  Post vital signs: Reviewed and stable  Last Vitals:  Vitals:   11/08/16 1538  BP: (!) 141/70  Pulse: 70  Resp: 16  Temp: 36.7 C    Last Pain:  Vitals:   11/08/16 1538  TempSrc: Oral  PainSc: 3       Patients Stated Pain Goal: 6 (43/14/27 6701)  Complications: No apparent anesthesia complications

## 2016-11-08 NOTE — Anesthesia Postprocedure Evaluation (Signed)
Anesthesia Post Note  Patient: Steven Cain  Procedure(s) Performed: Procedure(s) (LRB): CYSTOSCOPY WITH RETROGRADE PYELOGRAM/URETERAL STENT EXCHANGE (Left)  Patient location during evaluation: PACU Anesthesia Type: General Level of consciousness: awake and alert Pain management: pain level controlled Vital Signs Assessment: post-procedure vital signs reviewed and stable Respiratory status: spontaneous breathing, nonlabored ventilation and respiratory function stable Cardiovascular status: blood pressure returned to baseline and stable Postop Assessment: no signs of nausea or vomiting Anesthetic complications: no       Last Vitals:  Vitals:   11/08/16 1815 11/08/16 1900  BP: (!) 180/85 (!) 156/78  Pulse: 71   Resp: 18   Temp:      Last Pain:  Vitals:   11/08/16 1815  TempSrc:   PainSc: 0-No pain                 Lynda Rainwater

## 2016-11-08 NOTE — Brief Op Note (Signed)
11/08/2016  5:37 PM  PATIENT:  Steven Cain  83 y.o. male  PRE-OPERATIVE DIAGNOSIS:  CHRONIC LEFT URETERAL STRICTURE  POST-OPERATIVE DIAGNOSIS:  chronic left ureteral stricture  PROCEDURE:  Procedure(s): CYSTOSCOPY WITH RETROGRADE PYELOGRAM/URETERAL STENT EXCHANGE (Left)  SURGEON:  Surgeon(s) and Role:    * [name], MD - Primary  PHYSICIAN ASSISTANT:   ASSISTANTS: none   ANESTHESIA:   general  EBL:  No intake/output data recorded.  BLOOD ADMINISTERED:none  DRAINS: none   LOCAL MEDICATIONS USED:  NONE  SPECIMEN:  No Specimen  DISPOSITION OF SPECIMEN:  N/A  COUNTS:  YES  TOURNIQUET:  * No tourniquets in log *  DICTATION: .Other Dictation: Dictation Number [id number]  PLAN OF CARE: Discharge to home after PACU  PATIENT DISPOSITION:  PACU - hemodynamically stable.   Delay start of Pharmacological VTE agent (>24hrs) due to surgical blood loss or risk of bleeding: yes

## 2016-11-08 NOTE — Anesthesia Preprocedure Evaluation (Signed)
Anesthesia Evaluation  Patient identified by MRN, date of birth, ID band Patient awake    Reviewed: Allergy & Precautions, NPO status , Patient's Chart, lab work & pertinent test results  Airway Mallampati: II  TM Distance: >3 FB Neck ROM: Full    Dental  (+) Missing   Pulmonary neg pulmonary ROS,    breath sounds clear to auscultation       Cardiovascular hypertension, Pt. on medications  Rhythm:Regular Rate:Normal     Neuro/Psych Depression negative neurological ROS     GI/Hepatic Neg liver ROS, PUD,   Endo/Other  negative endocrine ROS  Renal/GU CRFRenal disease     Musculoskeletal  (+) Arthritis ,   Abdominal   Peds  Hematology negative hematology ROS (+)   Anesthesia Other Findings   Reproductive/Obstetrics                             Lab Results  Component Value Date   WBC 8.8 11/06/2016   HGB 11.9 (L) 11/06/2016   HCT 37.6 (L) 11/06/2016   MCV 86.2 11/06/2016   PLT 234 11/06/2016   Lab Results  Component Value Date   CREATININE 2.38 (H) 11/06/2016   BUN 29 (H) 11/06/2016   NA 139 11/06/2016   K 4.4 11/06/2016   CL 105 11/06/2016   CO2 26 11/06/2016    Anesthesia Physical  Anesthesia Plan  ASA: II  Anesthesia Plan: General   Post-op Pain Management:    Induction: Intravenous  Airway Management Planned: LMA  Additional Equipment:   Intra-op Plan:   Post-operative Plan:   Informed Consent: I have reviewed the patients History and Physical, chart, labs and discussed the procedure including the risks, benefits and alternatives for the proposed anesthesia with the patient or authorized representative who has indicated his/her understanding and acceptance.     Plan Discussed with: CRNA  Anesthesia Plan Comments:         Anesthesia Quick Evaluation

## 2016-11-08 NOTE — Interval H&P Note (Signed)
History and Physical Interval Note:  11/08/2016 4:28 PM  Steven Cain  has presented today for surgery, with the diagnosis of CHRONIC LEFT URETERAL STRICTURE  The various methods of treatment have been discussed with the patient and family. After consideration of risks, benefits and other options for treatment, the patient has consented to  Procedure(s): CYSTOSCOPY WITH RETROGRADE PYELOGRAM/URETERAL STENT EXCHANGE (Left) as a surgical intervention .  The patient's history has been reviewed, patient examined, no change in status, stable for surgery.  I have reviewed the patient's chart and labs.  Questions were answered to the patient's satisfaction.     [name]

## 2016-11-08 NOTE — H&P (Signed)
Steven Cain is an 83 y.o. male.    Chief Complaint: Pre-op LEFT Ureteral Stent Change  HPI:   1 - Obstructing Left Ureteral Stricture - worrisome soft tissue thickening of distal left ureter by CT and operative retrograde pyelogram 05/06/14. Now s/p stenting. No bladder lesion or additional lesions by imaging. He has h/o extensive smokeless tobacco exposure.    Recent Course:   05/2014 - Left Ureteroscopy / biopsy / brushing / washing / stent change (6x26)- path just mild atypia only, no frank carcinoma,   10/2014 - Repeat Left Ureteroscopy / Biopsy / stent exchange - path benign, no progression of stricture; 03/2015 Left stent change (7x26), no intraluminal filling defects  09/2015 - Stent change 7x26; 04/2016 Stent change 7x26   2 - Incontinence - Long h/o near total incontinence s/p TURP previously by Dr. Rosana Hoes. Manages with adult diapers x years.    PMH sig for Billroth II procedure R+Y, bilateral inguinal hernia repair with mesh, alcoholism (now very involved and compliant with AA) His PCP is Dr. Virgina Jock.    Today [name] is seen to proceed with LEFT ureteral stent change. He did have one interval NP visit for suspect UTI treated with Vantin, now back to baseline.    Past Medical History:  Diagnosis Date  . Anxiety   . Arthritis    DJD  . Benign enlargement of prostate    frequent UTI, trim of prostate done , with some issues of incontinence now.  . Bronchitis    chronic- usually has phelgm often  . Depression   . Dyspnea    with excertion  . Fluid retention in legs    occasional use of diuretic as needed  . GI bleed   . Hard of hearing    hear more on left side- no hearing aid  . History of transfusion   . Hypertension   . Perforated, severe stomach ulcer ([place])   . Spinal stenosis   . Transfusion history    none recent  . Ureteral stricture    CHRONIC  . UTI (urinary tract infection)    frequent    Past Surgical History:  Procedure Laterality Date  .  ABDOMINAL SURGERY     bilroths tube '74  . COLONOSCOPY N/A 09/29/2014   Procedure: COLONOSCOPY;  Surgeon: Beryle Beams, MD;  Location: [facility];  Service: Endoscopy;  Laterality: N/A;  . CYSTOSCOPY W/ URETERAL STENT PLACEMENT Left 05/06/2014   Procedure: CYSTOSCOPY WITH RETROGRADE PYELOGRAM/URETERAL STENT PLACEMENT;  Surgeon: [name], MD;  Location: [facility];  Service: Urology;  Laterality: Left;  . CYSTOSCOPY W/ URETERAL STENT PLACEMENT Left 03/31/2015   Procedure: CYSTOSCOPY WITH LEFT RETROGRADE PYELOGRAM/URETERAL STENT EXCHANGE;  Surgeon: [name], MD;  Location: [facility];  Service: Urology;  Laterality: Left;  . CYSTOSCOPY W/ URETERAL STENT PLACEMENT Left 09/15/2015   Procedure: CYSTOSCOPY WITH LEFT RETROGRADE PYELOGRAM/URETERAL STENT EXCHANGE ;  Surgeon: [name], MD;  Location: [facility];  Service: Urology;  Laterality: Left;  . CYSTOSCOPY W/ URETERAL STENT PLACEMENT Left 04/28/2016   Procedure: CYSTOSCOPY WITH RETROGRADE PYELOGRAM/URETERAL STENT EXCHANGE;  Surgeon: [name], MD;  Location: [facility];  Service: Urology;  Laterality: Left;  . CYSTOSCOPY WITH RETROGRADE PYELOGRAM, URETEROSCOPY AND STENT PLACEMENT Left 10/21/2014   Procedure: CYSTOSCOPY WITH RETROGRADE PYELOGRAM, URETEROSCOPY,BIOPSY AND STENT CHANGE;  Surgeon: [name], MD;  Location: [facility];  Service: Urology;  Laterality: Left;  . CYSTOSCOPY/RETROGRADE/URETEROSCOPY Left 06/03/2014   Procedure: CYSTOSCOPY/RETROGRADE/URETEROSCOPY WITH BIOPSY,STENT EXCHANGE;  Surgeon: [name],  MD;  Location: [facility];  Service: Urology;  Laterality: Left;  . ESOPHAGOGASTRODUODENOSCOPY N/A 09/28/2014   Procedure: ESOPHAGOGASTRODUODENOSCOPY (EGD);  Surgeon: Beryle Beams, MD;  Location: [facility];  Service: Endoscopy;  Laterality: N/A;  . GASTRIC ROUX-EN-Y     '74  . HERNIA REPAIR     bilateral hernia repairs   . TONSILLECTOMY    . TOTAL HIP ARTHROPLASTY Left 05/19/2016   Procedure: LEFT TOTAL HIP ARTHROPLASTY  ANTERIOR APPROACH;  Surgeon: Mcarthur Rossetti, MD;  Location: [facility];  Service: Orthopedics;  Laterality: Left;  . TRANSURETHRAL RESECTION OF PROSTATE      No family history on file. Social History:  reports that he has never smoked. He has quit using smokeless tobacco. His smokeless tobacco use included Chew. He reports that he does not drink alcohol or use drugs.  Allergies: No Known Allergies  No prescriptions prior to admission.    Results for orders placed or performed during the hospital encounter of 11/06/16 (from the past 48 hour(s))  Basic metabolic panel     Status: Abnormal   Collection Time: 11/06/16  1:48 PM  Result Value Ref Range   Sodium 139 135 - 145 mmol/L   Potassium 4.4 3.5 - 5.1 mmol/L   Chloride 105 101 - 111 mmol/L   CO2 26 22 - 32 mmol/L   Glucose, Bld 120 (H) 65 - 99 mg/dL   BUN 29 (H) 6 - 20 mg/dL   Creatinine, Ser 2.38 (H) 0.61 - 1.24 mg/dL   Calcium 9.4 8.9 - 10.3 mg/dL   GFR calc non Af Amer 24 (L) >60 mL/min   GFR calc Af Amer 27 (L) >60 mL/min    Comment: (NOTE) The eGFR has been calculated using the CKD EPI equation. This calculation has not been validated in all clinical situations. eGFR's persistently <60 mL/min signify possible Chronic Kidney Disease.    Anion gap 8 5 - 15  CBC     Status: Abnormal   Collection Time: 11/06/16  1:48 PM  Result Value Ref Range   WBC 8.8 4.0 - 10.5 K/uL   RBC 4.36 4.22 - 5.81 MIL/uL   Hemoglobin 11.9 (L) 13.0 - 17.0 g/dL   HCT 37.6 (L) 39.0 - 52.0 %   MCV 86.2 78.0 - 100.0 fL   MCH 27.3 26.0 - 34.0 pg   MCHC 31.6 30.0 - 36.0 g/dL   RDW 16.1 (H) 11.5 - 15.5 %   Platelets 234 150 - 400 K/uL   No results found.  Review of Systems  Constitutional: Negative.  Negative for chills and fever.  HENT: Negative.   Eyes: Negative.   Respiratory: Negative.   Cardiovascular: Negative.   Gastrointestinal: Negative.   Genitourinary: Negative.   Musculoskeletal: Negative.   Skin: Negative.   Neurological:  Negative.   Endo/Heme/Allergies: Negative.   Psychiatric/Behavioral: Negative.     There were no vitals taken for this visit. Physical Exam  Constitutional: He appears well-developed.  HENT:  Head: Normocephalic.  Eyes: Pupils are equal, round, and reactive to light.  Neck: Normal range of motion.  Cardiovascular: Normal rate.   Respiratory: Effort normal.  GI: Soft.  Genitourinary: Penis normal.  Genitourinary Comments: IN pull ups as per baseline  Musculoskeletal: Normal range of motion.  Neurological: He is alert.  Skin: Skin is warm.  Psychiatric: He has a normal mood and affect.     Assessment/Plan  Proceed as planned with LEFT ureteral stent exchage. Risks, benefits, alternatives, expected peri-op course discussed  previously and reiterated today.   [name], MD 11/08/2016, 6:51 AM

## 2016-11-08 NOTE — Anesthesia Procedure Notes (Signed)
Procedure Name: LMA Insertion Date/Time: 11/08/2016 5:10 PM Performed by: Candida Peeling RAY Pre-anesthesia Checklist: Patient identified, Emergency Drugs available, Suction available, Patient being monitored and Timeout performed Patient Re-evaluated:Patient Re-evaluated prior to inductionOxygen Delivery Method: Circle system utilized Preoxygenation: Pre-oxygenation with 100% oxygen Intubation Type: IV induction LMA: LMA inserted LMA Size: 4.0 Number of attempts: 2 Placement Confirmation: positive ETCO2 and breath sounds checked- equal and bilateral Tube secured with: Tape Dental Injury: Teeth and Oropharynx as per pre-operative assessment  Comments: No. 4 with gastric port placed, hard to seat, leaking.  Removed, no. 4 regular lma.  briefly leaked then good seal, capnograph.

## 2016-11-08 NOTE — Discharge Instructions (Signed)
1 - You may have urinary urgency (bladder spasms) and bloody urine on / off with stent in place. This is normal. ° °2 - Call MD or go to ER for fever >102, severe pain / nausea / vomiting not relieved by medications, or acute change in medical status ° °

## 2016-11-09 ENCOUNTER — Encounter (HOSPITAL_COMMUNITY): Payer: Self-pay | Admitting: Urology

## 2016-11-09 NOTE — Op Note (Signed)
NAME:  Steven Cain, WIENEKE NO.:  MEDICAL RECORD NO.:  [id number]  LOCATION:                                 FACILITY:  PHYSICIAN:  [name], MD     DATE OF BIRTH:  [birth year]  DATE OF PROCEDURE: 11/08/2016                              OPERATIVE REPORT   PREOPERATIVE DIAGNOSIS:  Chronic left ureteral stricture.  PROCEDURE: 1. Cystoscopy, left retrograde pyelogram and interpretation. 2. Exchange of left ureteral stent, 5 x 26 Contour, no tether.  ESTIMATED BLOOD LOSS:  Nil.  COMPLICATION:  None.  SPECIMEN:  None.  FINDINGS: 1. Unremarkable urinary bladder. 2. Successful replacement of left ureteral stent, proximal in renal     pelvis and distal in urinary bladder. 3. Mild hydronephrosis.  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  He is due for his scheduled stent change.  He wished to proceed, as his functional status remains adequate.  Informed consent was obtained and placed in the medical record.  PROCEDURE IN DETAIL:  The patient being, [name], was verified. Procedure being left ureteral stent change was confirmed.  Procedure was carried out.  Time-out was performed.  Intravenous antibiotics were administered.  General LMA anesthesia was induced.  The patient was placed into a low lithotomy position.  Sterile field was created by prepping and draping the patient's penis, perineum, proximal thighs using iodine.  The patient has known hypospadias distal shaft. Cystourethroscopy was performed using a rigid cystoscope with offset lens.  Inspection of anterior and posterior urethra were unremarkable. There was a wide-open channel in the prostatic fossa consistent with prior transurethral resection.   The distal end of the left ureteral stent was seen in situ.  It was non encrusted, it was grasped on its entirety, set aside for discard.  It was inspected and intact.  The left ureteral orifice was then cannulated with a 6-French end-hole catheter and left retrograde pyelogram was obtained.  Left retrograde pyelogram demonstrated single left ureter with single- system left kidney.  There was mild to moderate hydronephrosis.  A 0.038 Zip wire was advanced up to the level of the upper pole, over which, a new 7 x 26 Contour-type stent was placed using cystoscopic and fluoroscopic guidance.  Good proximal distal deployment was noted.  Bladder was emptied per cystoscope.  Procedure was then terminated.  The patient tolerated the procedure well.  There were no immediate periprocedural complications.  The patient was taken to the postanesthesia care in stable condition.          ______________________________ [name], MD     TM/MEDQ  D:  11/08/2016  T:  11/08/2016  Job:  [id number]

## 2017-01-16 ENCOUNTER — Other Ambulatory Visit (INDEPENDENT_AMBULATORY_CARE_PROVIDER_SITE_OTHER): Payer: Self-pay | Admitting: Orthopaedic Surgery

## 2017-03-22 ENCOUNTER — Inpatient Hospital Stay (HOSPITAL_COMMUNITY)
Admission: EM | Admit: 2017-03-22 | Discharge: 2017-03-24 | DRG: 312 | Disposition: A | Discharge status: Home Health | Payer: Medicare Other | Attending: Internal Medicine | Admitting: Internal Medicine

## 2017-03-22 ENCOUNTER — Encounter (HOSPITAL_COMMUNITY): Payer: Self-pay | Admitting: Emergency Medicine

## 2017-03-22 DIAGNOSIS — N3001 Acute cystitis with hematuria: Secondary | ICD-10-CM | POA: Diagnosis not present

## 2017-03-22 DIAGNOSIS — I440 Atrioventricular block, first degree: Secondary | ICD-10-CM | POA: Diagnosis present

## 2017-03-22 DIAGNOSIS — Z87891 Personal history of nicotine dependence: Secondary | ICD-10-CM

## 2017-03-22 DIAGNOSIS — Z79899 Other long term (current) drug therapy: Secondary | ICD-10-CM

## 2017-03-22 DIAGNOSIS — I444 Left anterior fascicular block: Secondary | ICD-10-CM | POA: Diagnosis present

## 2017-03-22 DIAGNOSIS — T502X5A Adverse effect of carbonic-anhydrase inhibitors, benzothiadiazides and other diuretics, initial encounter: Secondary | ICD-10-CM | POA: Diagnosis present

## 2017-03-22 DIAGNOSIS — E871 Hypo-osmolality and hyponatremia: Secondary | ICD-10-CM | POA: Diagnosis present

## 2017-03-22 DIAGNOSIS — R404 Transient alteration of awareness: Secondary | ICD-10-CM | POA: Diagnosis not present

## 2017-03-22 DIAGNOSIS — D649 Anemia, unspecified: Secondary | ICD-10-CM | POA: Diagnosis present

## 2017-03-22 DIAGNOSIS — R42 Dizziness and giddiness: Secondary | ICD-10-CM | POA: Diagnosis not present

## 2017-03-22 DIAGNOSIS — Z96642 Presence of left artificial hip joint: Secondary | ICD-10-CM | POA: Diagnosis present

## 2017-03-22 DIAGNOSIS — F419 Anxiety disorder, unspecified: Secondary | ICD-10-CM | POA: Diagnosis present

## 2017-03-22 DIAGNOSIS — I10 Essential (primary) hypertension: Secondary | ICD-10-CM | POA: Diagnosis not present

## 2017-03-22 DIAGNOSIS — Z79890 Hormone replacement therapy (postmenopausal): Secondary | ICD-10-CM

## 2017-03-22 DIAGNOSIS — E861 Hypovolemia: Secondary | ICD-10-CM | POA: Diagnosis not present

## 2017-03-22 DIAGNOSIS — Z7982 Long term (current) use of aspirin: Secondary | ICD-10-CM

## 2017-03-22 DIAGNOSIS — R531 Weakness: Secondary | ICD-10-CM | POA: Diagnosis not present

## 2017-03-22 DIAGNOSIS — Z8711 Personal history of peptic ulcer disease: Secondary | ICD-10-CM

## 2017-03-22 DIAGNOSIS — I951 Orthostatic hypotension: Secondary | ICD-10-CM | POA: Diagnosis not present

## 2017-03-22 DIAGNOSIS — R296 Repeated falls: Secondary | ICD-10-CM | POA: Diagnosis present

## 2017-03-22 DIAGNOSIS — N390 Urinary tract infection, site not specified: Secondary | ICD-10-CM | POA: Diagnosis present

## 2017-03-22 DIAGNOSIS — I872 Venous insufficiency (chronic) (peripheral): Secondary | ICD-10-CM | POA: Diagnosis present

## 2017-03-22 DIAGNOSIS — N183 Chronic kidney disease, stage 3 (moderate): Secondary | ICD-10-CM | POA: Diagnosis present

## 2017-03-22 DIAGNOSIS — I129 Hypertensive chronic kidney disease with stage 1 through stage 4 chronic kidney disease, or unspecified chronic kidney disease: Secondary | ICD-10-CM | POA: Diagnosis present

## 2017-03-22 DIAGNOSIS — Z87440 Personal history of urinary (tract) infections: Secondary | ICD-10-CM

## 2017-03-22 DIAGNOSIS — H9190 Unspecified hearing loss, unspecified ear: Secondary | ICD-10-CM | POA: Diagnosis present

## 2017-03-22 DIAGNOSIS — N1832 Chronic kidney disease, stage 3b: Secondary | ICD-10-CM | POA: Diagnosis present

## 2017-03-22 LAB — URINALYSIS, ROUTINE W REFLEX MICROSCOPIC
Bilirubin Urine: NEGATIVE
GLUCOSE, UA: NEGATIVE mg/dL
KETONES UR: NEGATIVE mg/dL
Nitrite: POSITIVE — AB
Protein, ur: 30 mg/dL — AB
Specific Gravity, Urine: 1.01 (ref 1.005–1.030)
Squamous Epithelial / LPF: NONE SEEN
pH: 6 (ref 5.0–8.0)

## 2017-03-22 LAB — CBC
HCT: 37 % — ABNORMAL LOW (ref 39.0–52.0)
Hemoglobin: 12.8 g/dL — ABNORMAL LOW (ref 13.0–17.0)
MCH: 28.2 pg (ref 26.0–34.0)
MCHC: 34.6 g/dL (ref 30.0–36.0)
MCV: 81.5 fL (ref 78.0–100.0)
PLATELETS: 224 10*3/uL (ref 150–400)
RBC: 4.54 MIL/uL (ref 4.22–5.81)
RDW: 14.8 % (ref 11.5–15.5)
WBC: 14.1 10*3/uL — AB (ref 4.0–10.5)

## 2017-03-22 LAB — COMPREHENSIVE METABOLIC PANEL
ALK PHOS: 91 U/L (ref 38–126)
ALT: 13 U/L — ABNORMAL LOW (ref 17–63)
AST: 38 U/L (ref 15–41)
Albumin: 4 g/dL (ref 3.5–5.0)
Anion gap: 9 (ref 5–15)
BILIRUBIN TOTAL: 0.5 mg/dL (ref 0.3–1.2)
BUN: 20 mg/dL (ref 6–20)
CALCIUM: 8.9 mg/dL (ref 8.9–10.3)
CO2: 26 mmol/L (ref 22–32)
Chloride: 91 mmol/L — ABNORMAL LOW (ref 101–111)
Creatinine, Ser: 1.65 mg/dL — ABNORMAL HIGH (ref 0.61–1.24)
GFR calc Af Amer: 42 mL/min — ABNORMAL LOW (ref >60)
GFR, EST NON AFRICAN AMERICAN: 37 mL/min — AB (ref >60)
Glucose, Bld: 142 mg/dL — ABNORMAL HIGH (ref 65–99)
POTASSIUM: 3.8 mmol/L (ref 3.5–5.1)
Sodium: 126 mmol/L — ABNORMAL LOW (ref 135–145)
TOTAL PROTEIN: 7.7 g/dL (ref 6.5–8.1)

## 2017-03-22 LAB — CBG MONITORING, ED: Glucose-Capillary: 137 mg/dL — ABNORMAL HIGH (ref 65–99)

## 2017-03-22 MED ORDER — HEPARIN SODIUM (PORCINE) 5000 UNIT/ML IJ SOLN
5000.0000 [IU] | Freq: Three times a day (TID) | INTRAMUSCULAR | Status: DC
  Administered 2017-03-22 – 2017-03-24 (×5): 5000 [IU] via SUBCUTANEOUS
  Filled 2017-03-22 (×5): qty 1

## 2017-03-22 MED ORDER — SODIUM CHLORIDE 0.9 % IV SOLN: INTRAVENOUS | Status: DC

## 2017-03-22 MED ORDER — HYDROCODONE-ACETAMINOPHEN 5-325 MG PO TABS
1.0000 | ORAL_TABLET | ORAL | Status: DC | PRN
Start: 2017-03-22 — End: 2017-03-24

## 2017-03-22 MED ORDER — ENOXAPARIN SODIUM 40 MG/0.4ML ~~LOC~~ SOLN: 40.0000 mg | SUBCUTANEOUS | Status: DC

## 2017-03-22 MED ORDER — CEFTRIAXONE SODIUM 1 G IJ SOLR
1.0000 g | INTRAMUSCULAR | Status: DC
  Administered 2017-03-23: 1 g via INTRAVENOUS
  Filled 2017-03-22: qty 10

## 2017-03-22 MED ORDER — ASPIRIN EC 325 MG PO TBEC
325.0000 mg | DELAYED_RELEASE_TABLET | Freq: Every day | ORAL | Status: DC
  Administered 2017-03-23 – 2017-03-24 (×2): 325 mg via ORAL
  Filled 2017-03-22 (×2): qty 1

## 2017-03-22 MED ORDER — B COMPLEX-C PO TABS
1.0000 | ORAL_TABLET | Freq: Every day | ORAL | Status: DC
Start: 2017-03-23 — End: 2017-03-24
  Administered 2017-03-23 – 2017-03-24 (×2): 1 via ORAL
  Filled 2017-03-22 (×2): qty 1

## 2017-03-22 MED ORDER — PSYLLIUM 95 % PO PACK
1.0000 | PACK | Freq: Every day | ORAL | Status: DC
  Administered 2017-03-23 – 2017-03-24 (×2): 1 via ORAL
  Filled 2017-03-22 (×2): qty 1

## 2017-03-22 MED ORDER — SENNOSIDES-DOCUSATE SODIUM 8.6-50 MG PO TABS: 1.0000 | ORAL_TABLET | Freq: Every evening | ORAL | Status: DC | PRN

## 2017-03-22 MED ORDER — ACETAMINOPHEN 325 MG PO TABS: 650.0000 mg | ORAL_TABLET | Freq: Four times a day (QID) | ORAL | Status: DC | PRN

## 2017-03-22 MED ORDER — DEXTROSE 5 % IV SOLN
1.0000 g | Freq: Once | INTRAVENOUS | Status: AC
  Administered 2017-03-22: 1 g via INTRAVENOUS
  Filled 2017-03-22: qty 10

## 2017-03-22 MED ORDER — ONDANSETRON HCL 4 MG PO TABS: 4.0000 mg | ORAL_TABLET | Freq: Four times a day (QID) | ORAL | Status: DC | PRN

## 2017-03-22 MED ORDER — ACETAMINOPHEN 650 MG RE SUPP: 650.0000 mg | Freq: Four times a day (QID) | RECTAL | Status: DC | PRN

## 2017-03-22 MED ORDER — VITAMIN C 500 MG PO TABS
1000.0000 mg | ORAL_TABLET | Freq: Every day | ORAL | Status: DC
  Administered 2017-03-23 – 2017-03-24 (×2): 1000 mg via ORAL
  Filled 2017-03-22 (×2): qty 2

## 2017-03-22 MED ORDER — ONDANSETRON HCL 4 MG/2ML IJ SOLN: 4.0000 mg | Freq: Four times a day (QID) | INTRAMUSCULAR | Status: DC | PRN

## 2017-03-22 MED ORDER — SODIUM CHLORIDE 0.9% FLUSH
3.0000 mL | Freq: Two times a day (BID) | INTRAVENOUS | Status: DC
  Administered 2017-03-22 – 2017-03-24 (×4): 3 mL via INTRAVENOUS

## 2017-03-22 MED ORDER — ADULT MULTIVITAMIN W/MINERALS CH
1.0000 | ORAL_TABLET | Freq: Every day | ORAL | Status: DC
  Administered 2017-03-23 – 2017-03-24 (×2): 1 via ORAL
  Filled 2017-03-22 (×2): qty 1

## 2017-03-22 MED ORDER — PAROXETINE HCL 20 MG PO TABS
40.0000 mg | ORAL_TABLET | Freq: Two times a day (BID) | ORAL | Status: DC
  Administered 2017-03-22 – 2017-03-24 (×4): 40 mg via ORAL
  Filled 2017-03-22 (×4): qty 2

## 2017-03-22 MED ORDER — SODIUM CHLORIDE 0.9 % IV BOLUS (SEPSIS)
1000.0000 mL | Freq: Once | INTRAVENOUS | Status: AC
  Administered 2017-03-22: 1000 mL via INTRAVENOUS

## 2017-03-22 MED ORDER — BISACODYL 5 MG PO TBEC: 5.0000 mg | DELAYED_RELEASE_TABLET | Freq: Every day | ORAL | Status: DC | PRN

## 2017-03-22 MED ORDER — VITAMIN E 45 MG (100 UNIT) PO CAPS
100.0000 [IU] | ORAL_CAPSULE | Freq: Every day | ORAL | Status: DC
  Administered 2017-03-23 – 2017-03-24 (×2): 100 [IU] via ORAL
  Filled 2017-03-22 (×2): qty 1

## 2017-03-22 MED ORDER — OMEGA-3-ACID ETHYL ESTERS 1 G PO CAPS
1.0000 g | ORAL_CAPSULE | Freq: Every day | ORAL | Status: DC
  Administered 2017-03-23 – 2017-03-24 (×2): 1 g via ORAL
  Filled 2017-03-22 (×2): qty 1

## 2017-03-22 MED ORDER — MECLIZINE HCL 25 MG PO TABS: 25.0000 mg | ORAL_TABLET | Freq: Two times a day (BID) | ORAL | Status: DC | PRN

## 2017-03-22 MED ORDER — HYDRALAZINE HCL 20 MG/ML IJ SOLN
10.0000 mg | INTRAMUSCULAR | Status: DC | PRN
  Administered 2017-03-23: 10 mg via INTRAVENOUS
  Filled 2017-03-22 (×3): qty 0.5

## 2017-03-22 NOTE — ED Notes (Signed)
Attempt [place] IV unsuccessful to left upper arm and right upper arm. Labs drawn from right upper arm.

## 2017-03-22 NOTE — ED Triage Notes (Signed)
Per EMS pt complaint of increased weakness, dizziness, and multiple falls over past 2 days; hx of vertigo; denies pain.

## 2017-03-22 NOTE — H&P (Signed)
History and Physical    MONTAGUE CORELLA QVZ:[id number] DOB: 1930-11-13 DOA: 03/22/2017  PCP: Shon Baton, MD   Patient coming from: Home  Chief Complaint: Lightheadedness, gen weakness, malaise   HPI: Olegario Shearer, M.D. is a 83 y.o. male with medical history significant for anxiety, hypertension, chronic ureteral stricture with stent, now presenting to the emergency department for evaluation of lightheadedness, generalized weakness, and malaise. Patient reports that he was in his usual state of health until approximately 2 or 3 days ago when he noted the insidious development of lightheadedness, particularly upon standing. He reports a history of vertigo, but states that this is different. He reports feeling as though he may pass out, but has not. He has fallen onto his bottom secondary to the lightheadedness, and then had difficulty getting up due to lightheadedness. He reports feeling dehydrated. His daughter reports that he has been drinking about 12 club sodas daily. Denies chest pain or palpitations and denies headache, change in vision or hearing, or loss of coordination. There has also been a generalized weakness, nausea, and malaise associated with this. No fevers or chills and no significant cough or dyspnea. He was a Occupational hygienist for 40 years.  ED Course: Upon arrival to the ED, patient is found to be afebrile, saturating well on room air, and with vitals otherwise stable. Systolic blood pressure dropped to 50 mmHg from lying to sitting. EKG features a sinus rhythm with first degree AV nodal block and left anterior fascicular block, similar to prior. Chemistry panel reveals a sodium of 126 and creatinine 1.65 which appears consistent with his baseline. CBC is notable for mild normocytic anemia with hemoglobin of 12.8 and a leukocytosis to 14,100. Urinalysis is suggestive of infection. Patient was treated with 2 L normal saline and 1 g of Rocephin in the ED. He remained orthostatic, but  otherwise stable, and will be observed on telemetry unit for ongoing evaluation and management.  Review of Systems:  All other systems reviewed and apart from HPI, are negative.  Past Medical History:  Diagnosis Date  . Anxiety   . Arthritis    DJD  . Benign enlargement of prostate    frequent UTI, trim of prostate done , with some issues of incontinence now.  . Bronchitis    chronic- usually has phelgm often  . Depression   . Dyspnea    with excertion  . Fluid retention in legs    occasional use of diuretic as needed  . GI bleed   . Hard of hearing    hear more on left side- no hearing aid  . History of transfusion   . Hypertension   . Perforated, severe stomach ulcer ([place])   . Spinal stenosis   . Transfusion history    none recent  . Ureteral stricture    CHRONIC  . UTI (urinary tract infection)    frequent    Past Surgical History:  Procedure Laterality Date  . ABDOMINAL SURGERY     bilroths tube '74  . COLONOSCOPY N/A 09/29/2014   Procedure: COLONOSCOPY;  Surgeon: Beryle Beams, MD;  Location: [facility];  Service: Endoscopy;  Laterality: N/A;  . CYSTOSCOPY W/ URETERAL STENT PLACEMENT Left 05/06/2014   Procedure: CYSTOSCOPY WITH RETROGRADE PYELOGRAM/URETERAL STENT PLACEMENT;  Surgeon: [name], MD;  Location: [facility];  Service: Urology;  Laterality: Left;  . CYSTOSCOPY W/ URETERAL STENT PLACEMENT Left 03/31/2015   Procedure: CYSTOSCOPY WITH LEFT RETROGRADE PYELOGRAM/URETERAL STENT EXCHANGE;  Surgeon: [name], MD;  Location: [facility];  Service: Urology;  Laterality: Left;  . CYSTOSCOPY W/ URETERAL STENT PLACEMENT Left 09/15/2015   Procedure: CYSTOSCOPY WITH LEFT RETROGRADE PYELOGRAM/URETERAL STENT EXCHANGE ;  Surgeon: [name], MD;  Location: [facility];  Service: Urology;  Laterality: Left;  . CYSTOSCOPY W/ URETERAL STENT PLACEMENT Left 04/28/2016   Procedure: CYSTOSCOPY WITH RETROGRADE PYELOGRAM/URETERAL STENT EXCHANGE;  Surgeon: [name], MD;   Location: [facility];  Service: Urology;  Laterality: Left;  . CYSTOSCOPY W/ URETERAL STENT PLACEMENT Left 11/08/2016   Procedure: CYSTOSCOPY WITH RETROGRADE PYELOGRAM/URETERAL STENT EXCHANGE;  Surgeon: [name], MD;  Location: [facility];  Service: Urology;  Laterality: Left;  . CYSTOSCOPY WITH RETROGRADE PYELOGRAM, URETEROSCOPY AND STENT PLACEMENT Left 10/21/2014   Procedure: CYSTOSCOPY WITH RETROGRADE PYELOGRAM, URETEROSCOPY,BIOPSY AND STENT CHANGE;  Surgeon: [name], MD;  Location: [facility];  Service: Urology;  Laterality: Left;  . CYSTOSCOPY/RETROGRADE/URETEROSCOPY Left 06/03/2014   Procedure: CYSTOSCOPY/RETROGRADE/URETEROSCOPY WITH BIOPSY,STENT EXCHANGE;  Surgeon: [name], MD;  Location: [facility];  Service: Urology;  Laterality: Left;  . ESOPHAGOGASTRODUODENOSCOPY N/A 09/28/2014   Procedure: ESOPHAGOGASTRODUODENOSCOPY (EGD);  Surgeon: Beryle Beams, MD;  Location: [facility];  Service: Endoscopy;  Laterality: N/A;  . GASTRIC ROUX-EN-Y     '74  . HERNIA REPAIR     bilateral hernia repairs   . TONSILLECTOMY    . TOTAL HIP ARTHROPLASTY Left 05/19/2016   Procedure: LEFT TOTAL HIP ARTHROPLASTY ANTERIOR APPROACH;  Surgeon: Mcarthur Rossetti, MD;  Location: [facility];  Service: Orthopedics;  Laterality: Left;  . TRANSURETHRAL RESECTION OF PROSTATE       reports that he has never smoked. He has quit using smokeless tobacco. His smokeless tobacco use included Chew. He reports that he does not drink alcohol or use drugs.  No Known Allergies  History reviewed. No pertinent family history.   Prior to Admission medications   Medication Sig Start Date End Date Taking? Authorizing Provider  amLODipine (NORVASC) 5 MG tablet Take 5 mg by mouth at bedtime. 02/17/17  Yes [provider]  Ascorbic Acid (VITAMIN C) 1000 MG tablet Take 1,000 mg by mouth every morning.    Yes [provider]  aspirin EC 325 MG EC tablet Take 1 tablet (325 mg total) by mouth 2 (two) times daily  after a meal. Patient taking differently: Take 325 mg by mouth daily.  05/21/16  Yes Mcarthur Rossetti, MD  b complex vitamins tablet Take 1 tablet by mouth daily.   Yes [provider]  Cyanocobalamin (VITAMIN B-12 IJ) Inject 1 mL as directed every 30 (thirty) days.   Yes [provider]  fish oil-omega-3 fatty acids 1000 MG capsule Take 2 g by mouth every morning.    Yes [provider]  hydrochlorothiazide (HYDRODIURIL) 12.5 MG tablet Take 12.5 mg by mouth every other day.    Yes [provider]  meclizine (ANTIVERT) 25 MG tablet Take 25 mg by mouth 2 (two) times daily as needed for dizziness.   Yes [provider]  meloxicam (MOBIC) 15 MG tablet TAKE 1 TABLET BY MOUTH EVERY DAY WITH FOOD AS NEEDED FOR INFLAMMATION/PAIN 01/16/17  Yes Mcarthur Rossetti, MD  Multiple Vitamin (MULTIVITAMIN WITH MINERALS) TABS tablet Take 1 tablet by mouth every morning.   Yes [provider]  PARoxetine (PAXIL) 20 MG tablet Take 40 mg by mouth 3 (three) times daily. If sleepy only take 1 tablet   Yes [provider]  psyllium (HYDROCIL/METAMUCIL) 95 % PACK Take 1 packet by mouth  daily.    Yes [provider]  testosterone cypionate (DEPOTESTOTERONE CYPIONATE) 200 MG/ML injection Inject 60 mg into the muscle every 6 (six) weeks. 3/10 of cc.   Yes [provider]  vitamin E 100 UNIT capsule Take 100 Units by mouth every morning.    Yes [provider]    Physical Exam: Vitals:   03/22/17 1900 03/22/17 2117 03/22/17 2119 03/22/17 2122  BP: (!) 151/86 (!) 152/78 123/83 100/76  Pulse: 71 74 79 96  Resp: 13     Temp:      TempSrc:      SpO2: 96% 94% 96% 97%      Constitutional: NAD, calm, comfortable Eyes: PERTLA, lids and conjunctivae normal ENMT: Mucous membranes are moist. Posterior pharynx clear of any exudate or lesions.   Neck: normal, supple, no masses, no thyromegaly Respiratory: clear to auscultation  bilaterally, no wheezing, no crackles. Normal respiratory effort.   Cardiovascular: S1 & S2 heard, regular rate and rhythm. Bilateral LE edema. No significant JVD. Abdomen: No distension, no tenderness, no masses palpated. Bowel sounds normal.  Musculoskeletal: no clubbing / cyanosis. No joint deformity upper and lower extremities.  Skin: Erythema and ulcerations to lower legs. Otherwise warm, dry, well-perfused. Neurologic: CN 2-12 grossly intact. Sensation intact, DTR normal. Strength 5/5 in all 4 limbs.  Psychiatric: Alert and oriented x 3. Pleasant and cooperative.     Labs on Admission: I have personally reviewed following labs and imaging studies  CBC:  Recent Labs Lab 03/22/17 1808  WBC 14.1*  HGB 12.8*  HCT 37.0*  MCV 81.5  PLT 161   Basic Metabolic Panel:  Recent Labs Lab 03/22/17 1808  NA 126*  K 3.8  CL 91*  CO2 26  GLUCOSE 142*  BUN 20  CREATININE 1.65*  CALCIUM 8.9   GFR: CrCl cannot be calculated (Unknown ideal weight.). Liver Function Tests:  Recent Labs Lab 03/22/17 1808  AST 38  ALT 13*  ALKPHOS 91  BILITOT 0.5  PROT 7.7  ALBUMIN 4.0   No results for input(s): LIPASE, AMYLASE in the last 168 hours. No results for input(s): AMMONIA in the last 168 hours. Coagulation Profile: No results for input(s): INR, PROTIME in the last 168 hours. Cardiac Enzymes: No results for input(s): CKTOTAL, CKMB, CKMBINDEX, TROPONINI in the last 168 hours. BNP (last 3 results) No results for input(s): PROBNP in the last 8760 hours. HbA1C: No results for input(s): HGBA1C in the last 72 hours. CBG:  Recent Labs Lab 03/22/17 1743  GLUCAP 137*   Lipid Profile: No results for input(s): CHOL, HDL, LDLCALC, TRIG, CHOLHDL, LDLDIRECT in the last 72 hours. Thyroid Function Tests: No results for input(s): TSH, T4TOTAL, FREET4, T3FREE, THYROIDAB in the last 72 hours. Anemia Panel: No results for input(s): VITAMINB12, FOLATE, FERRITIN, TIBC, IRON, RETICCTPCT in  the last 72 hours. Urine analysis:    Component Value Date/Time   COLORURINE YELLOW 03/22/2017 1844   APPEARANCEUR HAZY (A) 03/22/2017 1844   LABSPEC 1.010 03/22/2017 1844   PHURINE 6.0 03/22/2017 1844   GLUCOSEU NEGATIVE 03/22/2017 1844   HGBUR MODERATE (A) 03/22/2017 1844   BILIRUBINUR NEGATIVE 03/22/2017 1844   KETONESUR NEGATIVE 03/22/2017 1844   PROTEINUR 30 (A) 03/22/2017 1844   UROBILINOGEN 0.2 05/06/2014 1815   NITRITE POSITIVE (A) 03/22/2017 1844   LEUKOCYTESUR LARGE (A) 03/22/2017 1844   Sepsis Labs: @LABRCNTIP (procalcitonin:4,lacticidven:4) )No results found for this or any previous visit (from the past 240 hour(s)).   Radiological Exams on Admission: No results  found.  EKG: Independently reviewed. Sinus rhythm, 1st degree AV block, LAFB, similar to prior.   Assessment/Plan  1. Near-syncope  - Pt presents with 2-3 days of worsening lightheadedness on standing, as well as general malaise  - Found to have 50 mmHg drop in SBP from laying to sitting and hyponatremia - He was treated with 2 liters NS in ED, but remains lightheaded when sitting up or standing  - Likely secondary to orhtostasis, possibly secondary to hypovolemia or autonomic dysfunction  - Plan to monitor on telemetry and obtain echocardiogram, check serum osm and urine studies as below and consider additional IVF    2. Hyponatremia  - Serum sodium is 126, down from 139 in March  - Was suspected to be secondary to hypovolemia and he was treated with 2 liter NS bolus in ED  - His family relates that he drinks ~150-200 ounces of club soda daily recently, suggesting polydipsia as possible etiology  - He is taking HCTZ and high-dose Paxil; will hold HCTZ and cut back on Paxil to BID from TID  - Check serum and urine osmolality, urine sodium  - Repeat chem panel overnight  3. UTI  - UA suggests UTI and pt reports recent urinary urgency and frequency  - Prior urine culture with pan-sensitive E coli  -  Started on Rocephin in ED   - Will send sample for culture and continue empiric Rocephin    4. CKD stage III  - SCr is 1.65 on admission, consistent with his apparent baseline  - He has been hydrated in ED with 2 liters NS and HCTZ is held  - Repeat chem panel pending   5. Hypertension - BP at goal  - HCTZ held in light of hyponatremia  - Monitor and treat with hydralazine IVP's prn    6. Anxiety  - Taking Paxil 40 mg TID - Possibly contributing to hyponatremia, will cut back to BID    DVT prophylaxis: sq heparin Code Status: Full  Family Communication: Family updated at bedside Disposition Plan: Observe on telemetry Consults called: None Admission status: Observation    Vianne Bulls, MD [facility] Pager [phone]  If 7PM-7AM, please contact night-coverage [email or web address] Password [facility]  03/22/2017, 9:44 PM

## 2017-03-22 NOTE — ED Notes (Signed)
Pt care assumed, family at bedside, pt is resting comfortably.  Denies any complaint at this time.

## 2017-03-22 NOTE — ED Provider Notes (Signed)
[facility] DEPT Provider Note   CSN: [id number] Arrival date & time: 03/22/17  1632     History   Chief Complaint Chief Complaint  Patient presents with  . Dizziness    HPI SEPHIROTH MCLUCKIE is a 83 y.o. male.  HPI  83 year old male with extensive past medical history listed below presents to the emergency department for several days of worsening fatigue and gait instability. Patient reports that he has a history of vertigo and is on meclizine however states that his symptoms are not typical of his normal vertiginous symptoms. He states that he feels "off-balance." Symptoms have been gradually worsening over the past several days. Patient believes that he is dehydrated, because he "urinates a lot" on diuretic. Patient states that due to his gait instability he has fallen 4 times in the past 2 days. Denies any head trauma or loss of consciousness during these events. Denies any physical injuries.   Family reports that they found the patient down at home one to 2 hours prior to arrival. Last time patient was seen was 5 hours prior to arrival. When they found the patient, he had difficulty standing.   Of note they report that the patient has had issues with hyponatremia in the past. 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 Patient denies any dysuria, change in frequency.  Patient is on diuretic due to peripheral edema from venous insufficiency. They state that the patient has not had any cardiac workup for heart failure.  He denies any recent fevers, URI symptoms, headache, chest pain, shortness of breath, abdominal pain, back pain, diarrhea or constipation.  Past Medical History:  Diagnosis Date  . Anxiety   . Arthritis    DJD  . Benign enlargement of prostate    frequent UTI, trim of prostate done , with some issues of incontinence now.  . Bronchitis    chronic- usually has  phelgm often  . Depression   . Dyspnea    with excertion  . Fluid retention in legs    occasional use of diuretic as needed  . GI bleed   . Hard of hearing    hear more on left side- no hearing aid  . History of transfusion   . Hypertension   . Perforated, severe stomach ulcer ([place])   . Spinal stenosis   . Transfusion history    none recent  . Ureteral stricture    CHRONIC  . UTI (urinary tract infection)    frequent    Patient Active Problem List   Diagnosis Date Noted  . Osteoarthritis of left hip 05/19/2016  . Status post left hip replacement 05/19/2016  . CKD (chronic kidney disease) stage 3, GFR 30-59 ml/min 09/27/2014  . Anemia associated with acute blood loss 09/27/2014  . Orthostatic dizziness 09/27/2014  . GI bleed 09/27/2014  . Acute pyelonephritis 05/08/2014    Class: Acute  . Hydronephrosis of left kidney 05/08/2014    Class: Acute  . Chronic kidney disease 05/08/2014    Class: Chronic  . Ureteral mass 05/08/2014    Class: Acute  . UTI (lower urinary tract infection) 04/24/2013  . Chills 04/24/2013    Past Surgical History:  Procedure Laterality Date  . ABDOMINAL SURGERY     bilroths tube '74  . COLONOSCOPY N/A 09/29/2014   Procedure: COLONOSCOPY;  Surgeon: Beryle Beams, MD;  Location: [facility];  Service: Endoscopy;  Laterality: N/A;  .  CYSTOSCOPY W/ URETERAL STENT PLACEMENT Left 05/06/2014   Procedure: CYSTOSCOPY WITH RETROGRADE PYELOGRAM/URETERAL STENT PLACEMENT;  Surgeon: [name], MD;  Location: [facility];  Service: Urology;  Laterality: Left;  . CYSTOSCOPY W/ URETERAL STENT PLACEMENT Left 03/31/2015   Procedure: CYSTOSCOPY WITH LEFT RETROGRADE PYELOGRAM/URETERAL STENT EXCHANGE;  Surgeon: [name], MD;  Location: [facility];  Service: Urology;  Laterality: Left;  . CYSTOSCOPY W/ URETERAL STENT PLACEMENT Left 09/15/2015   Procedure: CYSTOSCOPY WITH LEFT RETROGRADE PYELOGRAM/URETERAL STENT EXCHANGE ;  Surgeon: [name], MD;  Location: [facility]  [facility];  Service: Urology;  Laterality: Left;  . CYSTOSCOPY W/ URETERAL STENT PLACEMENT Left 04/28/2016   Procedure: CYSTOSCOPY WITH RETROGRADE PYELOGRAM/URETERAL STENT EXCHANGE;  Surgeon: [name], MD;  Location: [facility];  Service: Urology;  Laterality: Left;  . CYSTOSCOPY W/ URETERAL STENT PLACEMENT Left 11/08/2016   Procedure: CYSTOSCOPY WITH RETROGRADE PYELOGRAM/URETERAL STENT EXCHANGE;  Surgeon: [name], MD;  Location: [facility];  Service: Urology;  Laterality: Left;  . CYSTOSCOPY WITH RETROGRADE PYELOGRAM, URETEROSCOPY AND STENT PLACEMENT Left 10/21/2014   Procedure: CYSTOSCOPY WITH RETROGRADE PYELOGRAM, URETEROSCOPY,BIOPSY AND STENT CHANGE;  Surgeon: [name], MD;  Location: [facility];  Service: Urology;  Laterality: Left;  . CYSTOSCOPY/RETROGRADE/URETEROSCOPY Left 06/03/2014   Procedure: CYSTOSCOPY/RETROGRADE/URETEROSCOPY WITH BIOPSY,STENT EXCHANGE;  Surgeon: [name], MD;  Location: [facility];  Service: Urology;  Laterality: Left;  . ESOPHAGOGASTRODUODENOSCOPY N/A 09/28/2014   Procedure: ESOPHAGOGASTRODUODENOSCOPY (EGD);  Surgeon: Beryle Beams, MD;  Location: [facility];  Service: Endoscopy;  Laterality: N/A;  . GASTRIC ROUX-EN-Y     '74  . HERNIA REPAIR     bilateral hernia repairs   . TONSILLECTOMY    . TOTAL HIP ARTHROPLASTY Left 05/19/2016   Procedure: LEFT TOTAL HIP ARTHROPLASTY ANTERIOR APPROACH;  Surgeon: Mcarthur Rossetti, MD;  Location: [facility];  Service: Orthopedics;  Laterality: Left;  . TRANSURETHRAL RESECTION OF PROSTATE         Home Medications    Prior to Admission medications   Medication Sig Start Date End Date Taking? Authorizing Provider  amLODipine (NORVASC) 5 MG tablet Take 5 mg by mouth at bedtime. 02/17/17  Yes [provider]  Ascorbic Acid (VITAMIN C) 1000 MG tablet Take 1,000 mg by mouth every morning.    Yes [provider]  aspirin EC 325 MG EC tablet Take 1 tablet (325 mg total) by mouth 2 (two) times daily after a  meal. Patient taking differently: Take 325 mg by mouth daily.  05/21/16  Yes Mcarthur Rossetti, MD  b complex vitamins tablet Take 1 tablet by mouth daily.   Yes [provider]  Cyanocobalamin (VITAMIN B-12 IJ) Inject 1 mL as directed every 30 (thirty) days.   Yes [provider]  fish oil-omega-3 fatty acids 1000 MG capsule Take 2 g by mouth every morning.    Yes [provider]  hydrochlorothiazide (HYDRODIURIL) 12.5 MG tablet Take 12.5 mg by mouth every other day.    Yes [provider]  meclizine (ANTIVERT) 25 MG tablet Take 25 mg by mouth 2 (two) times daily as needed for dizziness.   Yes [provider]  meloxicam (MOBIC) 15 MG tablet TAKE 1 TABLET BY MOUTH EVERY DAY WITH FOOD AS NEEDED FOR INFLAMMATION/PAIN 01/16/17  Yes Mcarthur Rossetti, MD  Multiple Vitamin (MULTIVITAMIN WITH MINERALS) TABS tablet Take 1 tablet by mouth every morning.   Yes [provider]  PARoxetine (PAXIL) 20 MG tablet Take 40 mg by mouth 3 (three) times daily. If sleepy  only take 1 tablet   Yes [provider]  psyllium (HYDROCIL/METAMUCIL) 95 % PACK Take 1 packet by mouth daily.    Yes [provider]  testosterone cypionate (DEPOTESTOTERONE CYPIONATE) 200 MG/ML injection Inject 60 mg into the muscle every 6 (six) weeks. 3/10 of cc.   Yes [provider]  vitamin E 100 UNIT capsule Take 100 Units by mouth every morning.    Yes [provider]    Family History No family history on file.  Social History Social History  Substance Use Topics  . Smoking status: Never Smoker  . Smokeless tobacco: Former Systems developer    Types: Chew  . Alcohol use No     Allergies   Patient has no known allergies.   Review of Systems Review of Systems All other systems are reviewed and are negative for acute change except as noted in the HPI   Physical Exam Updated Vital Signs BP (!) 151/86   Pulse 71   Temp 97.9 F (36.6 C)  (Oral)   Resp 13   SpO2 96%   Physical Exam  Constitutional: He is oriented to person, place, and time. He appears well-developed and well-nourished. No distress.  HENT:  Head: Normocephalic and atraumatic.  Nose: Nose normal.  Eyes: Pupils are equal, round, and reactive to light. Conjunctivae and EOM are normal. Right eye exhibits no discharge. Left eye exhibits no discharge. No scleral icterus.  Neck: Normal range of motion. Neck supple.  Cardiovascular: Normal rate and regular rhythm.  Exam reveals no gallop and no friction rub.   No murmur heard. Pulmonary/Chest: Effort normal and breath sounds normal. No stridor. No respiratory distress. He has no rales.  Abdominal: Soft. He exhibits no distension. There is no tenderness.  Musculoskeletal: He exhibits no edema or tenderness.  1+ bilateral lower extremity pedal edema. Chronic skin changes/hyperemia to bilateral lower extremities from feet to mid calf (Baseline per patient and family).  Neurological: He is alert and oriented to person, place, and time.  Mental Status: Alert and oriented to person, place, and time. Attention and concentration normal. Speech clear. Recent memory is intact  Cranial Nerves  II Visual Fields: Intact to confrontation. Visual fields intact. III, IV, VI: Pupils equal and reactive to light and near. Full eye movement without nystagmus  V Facial Sensation: Normal. No weakness of masticatory muscles  VII: No facial weakness or asymmetry  VIII Auditory Acuity: Grossly normal  IX/X: The uvula is midline; the palate elevates symmetrically  XI: Normal sternocleidomastoid and trapezius strength  XII: The tongue is midline. No atrophy or fasciculations.   Motor System: Muscle Strength: 5/5 and symmetric in the upper and lower extremities. No pronation or drift.  Muscle Tone: Tone and muscle bulk are normal in the upper and lower extremities.   Reflexes: DTRs: 1+ and symmetrical in all four extremities. Plantar  responses are flexor bilaterally.  Coordination: Intact finger-to-nose, heel-to-shin. No tremor.  Sensation: Intact to light touch, and pinprick.   Gait: Routine gait at baseline.    Skin: Skin is warm and dry. No rash noted. He is not diaphoretic. No erythema.  Psychiatric: He has a normal mood and affect.  Vitals reviewed.    ED Treatments / Results  Labs (all labs ordered are listed, but only abnormal results are displayed) Labs Reviewed  CBC - Abnormal; Notable for the following:       Result Value   WBC 14.1 (*)    Hemoglobin 12.8 (*)  HCT 37.0 (*)    All other components within normal limits  URINALYSIS, ROUTINE W REFLEX MICROSCOPIC - Abnormal; Notable for the following:    APPearance HAZY (*)    Hgb urine dipstick MODERATE (*)    Protein, ur 30 (*)    Nitrite POSITIVE (*)    Leukocytes, UA LARGE (*)    Bacteria, UA FEW (*)    All other components within normal limits  COMPREHENSIVE METABOLIC PANEL - Abnormal; Notable for the following:    Sodium 126 (*)    Chloride 91 (*)    Glucose, Bld 142 (*)    Creatinine, Ser 1.65 (*)    ALT 13 (*)    GFR calc non Af Amer 37 (*)    GFR calc Af Amer 42 (*)    All other components within normal limits  CBG MONITORING, ED - Abnormal; Notable for the following:    Glucose-Capillary 137 (*)    All other components within normal limits    EKG  EKG Interpretation  Date/Time:  Thursday March 22 2017 17:30:11 EDT Ventricular Rate:  77 PR Interval:    QRS Duration: 117 QT Interval:  442 QTC Calculation: 501 R Axis:   -60 Text Interpretation:  Sinus rhythm Prolonged PR interval Left anterior fascicular block Left ventricular hypertrophy No significant change since last tracing Confirmed by Addison Lank [phone]) on 03/22/2017 6:00:48 PM       Radiology No results found.  Procedures Procedures (including critical care time)  Medications Ordered in ED Medications  sodium chloride 0.9 % bolus 1,000 mL (not  administered)  sodium chloride 0.9 % bolus 1,000 mL (0 mLs Intravenous Stopped 03/22/17 2117)  cefTRIAXone (ROCEPHIN) 1 g in dextrose 5 % 50 mL IVPB (0 g Intravenous Stopped 03/22/17 2117)     Initial Impression / Assessment and Plan / ED Course  I have reviewed the triage vital signs and the nursing notes.  Pertinent labs & imaging results that were available during my care of the patient were reviewed by me and considered in my medical decision making (see chart for details).     .  Workup notable for hypo-natremia; likely secondary to thiazide diuretic and third spacing from peripheral edema. May be contributing to patient's recent fatigue.  In addition patient was also noted to have urinary tract infection. Provided with IV Rocephin.   Workup positive for orthostasis. Patient provided with IV fluids and still orthostatic.   Will discuss with Hospitalist for admission.   Final Clinical Impressions(s) / ED Diagnoses   Final diagnoses:  Hyponatremia  Acute cystitis with hematuria  Orthostasis      Jaleeyah Munce, Grayce Sessions, MD 03/22/17 2125

## 2017-03-22 NOTE — ED Notes (Signed)
Call Kopperston, RN with report at 22:20, [phone]

## 2017-03-22 NOTE — ED Notes (Signed)
Ultrasound IV at bedside 

## 2017-03-23 ENCOUNTER — Observation Stay (HOSPITAL_BASED_OUTPATIENT_CLINIC_OR_DEPARTMENT_OTHER): Payer: Medicare Other

## 2017-03-23 DIAGNOSIS — Z7982 Long term (current) use of aspirin: Secondary | ICD-10-CM | POA: Diagnosis not present

## 2017-03-23 DIAGNOSIS — D649 Anemia, unspecified: Secondary | ICD-10-CM | POA: Diagnosis present

## 2017-03-23 DIAGNOSIS — Z79899 Other long term (current) drug therapy: Secondary | ICD-10-CM | POA: Diagnosis not present

## 2017-03-23 DIAGNOSIS — H9190 Unspecified hearing loss, unspecified ear: Secondary | ICD-10-CM | POA: Diagnosis present

## 2017-03-23 DIAGNOSIS — I951 Orthostatic hypotension: Secondary | ICD-10-CM | POA: Diagnosis not present

## 2017-03-23 DIAGNOSIS — F419 Anxiety disorder, unspecified: Secondary | ICD-10-CM | POA: Diagnosis present

## 2017-03-23 DIAGNOSIS — E871 Hypo-osmolality and hyponatremia: Secondary | ICD-10-CM | POA: Diagnosis not present

## 2017-03-23 DIAGNOSIS — R296 Repeated falls: Secondary | ICD-10-CM | POA: Diagnosis present

## 2017-03-23 DIAGNOSIS — E861 Hypovolemia: Secondary | ICD-10-CM | POA: Diagnosis present

## 2017-03-23 DIAGNOSIS — I517 Cardiomegaly: Secondary | ICD-10-CM

## 2017-03-23 DIAGNOSIS — I361 Nonrheumatic tricuspid (valve) insufficiency: Secondary | ICD-10-CM | POA: Diagnosis not present

## 2017-03-23 DIAGNOSIS — Z79890 Hormone replacement therapy (postmenopausal): Secondary | ICD-10-CM | POA: Diagnosis not present

## 2017-03-23 DIAGNOSIS — I872 Venous insufficiency (chronic) (peripheral): Secondary | ICD-10-CM | POA: Diagnosis present

## 2017-03-23 DIAGNOSIS — I10 Essential (primary) hypertension: Secondary | ICD-10-CM | POA: Diagnosis not present

## 2017-03-23 DIAGNOSIS — I071 Rheumatic tricuspid insufficiency: Secondary | ICD-10-CM

## 2017-03-23 DIAGNOSIS — Z87440 Personal history of urinary (tract) infections: Secondary | ICD-10-CM | POA: Diagnosis not present

## 2017-03-23 DIAGNOSIS — I351 Nonrheumatic aortic (valve) insufficiency: Secondary | ICD-10-CM

## 2017-03-23 DIAGNOSIS — Z87891 Personal history of nicotine dependence: Secondary | ICD-10-CM | POA: Diagnosis not present

## 2017-03-23 DIAGNOSIS — I444 Left anterior fascicular block: Secondary | ICD-10-CM | POA: Diagnosis present

## 2017-03-23 DIAGNOSIS — N3001 Acute cystitis with hematuria: Secondary | ICD-10-CM | POA: Diagnosis not present

## 2017-03-23 DIAGNOSIS — T502X5A Adverse effect of carbonic-anhydrase inhibitors, benzothiadiazides and other diuretics, initial encounter: Secondary | ICD-10-CM | POA: Diagnosis present

## 2017-03-23 DIAGNOSIS — Z96642 Presence of left artificial hip joint: Secondary | ICD-10-CM | POA: Diagnosis present

## 2017-03-23 DIAGNOSIS — I129 Hypertensive chronic kidney disease with stage 1 through stage 4 chronic kidney disease, or unspecified chronic kidney disease: Secondary | ICD-10-CM | POA: Diagnosis present

## 2017-03-23 DIAGNOSIS — I440 Atrioventricular block, first degree: Secondary | ICD-10-CM | POA: Diagnosis present

## 2017-03-23 DIAGNOSIS — N183 Chronic kidney disease, stage 3 (moderate): Secondary | ICD-10-CM | POA: Diagnosis not present

## 2017-03-23 DIAGNOSIS — Z8711 Personal history of peptic ulcer disease: Secondary | ICD-10-CM | POA: Diagnosis not present

## 2017-03-23 HISTORY — DX: Nonrheumatic aortic (valve) insufficiency: I35.1

## 2017-03-23 HISTORY — DX: Cardiomegaly: I51.7

## 2017-03-23 HISTORY — DX: Rheumatic tricuspid insufficiency: I07.1

## 2017-03-23 LAB — BASIC METABOLIC PANEL
ANION GAP: 9 (ref 5–15)
ANION GAP: 11 (ref 5–15)
Anion gap: 8 (ref 5–15)
BUN: 19 mg/dL (ref 6–20)
BUN: 18 mg/dL (ref 6–20)
BUN: 18 mg/dL (ref 6–20)
CALCIUM: 8.9 mg/dL (ref 8.9–10.3)
CHLORIDE: 98 mmol/L — AB (ref 101–111)
CO2: 26 mmol/L (ref 22–32)
CO2: 25 mmol/L (ref 22–32)
CO2: 26 mmol/L (ref 22–32)
CREATININE: 1.69 mg/dL — AB (ref 0.61–1.24)
CREATININE: 1.67 mg/dL — AB (ref 0.61–1.24)
Calcium: 8.9 mg/dL (ref 8.9–10.3)
Calcium: 8.8 mg/dL — ABNORMAL LOW (ref 8.9–10.3)
Chloride: 96 mmol/L — ABNORMAL LOW (ref 101–111)
Chloride: 100 mmol/L — ABNORMAL LOW (ref 101–111)
Creatinine, Ser: 1.63 mg/dL — ABNORMAL HIGH (ref 0.61–1.24)
GFR calc Af Amer: 43 mL/min — ABNORMAL LOW (ref >60)
GFR calc non Af Amer: 36 mL/min — ABNORMAL LOW (ref >60)
GFR calc non Af Amer: 37 mL/min — ABNORMAL LOW (ref >60)
GFR, EST AFRICAN AMERICAN: 41 mL/min — AB (ref >60)
GFR, EST AFRICAN AMERICAN: 42 mL/min — AB (ref >60)
GFR, EST NON AFRICAN AMERICAN: 35 mL/min — AB (ref >60)
Glucose, Bld: 108 mg/dL — ABNORMAL HIGH (ref 65–99)
Glucose, Bld: 139 mg/dL — ABNORMAL HIGH (ref 65–99)
Glucose, Bld: 116 mg/dL — ABNORMAL HIGH (ref 65–99)
Potassium: 3.9 mmol/L (ref 3.5–5.1)
Potassium: 3.8 mmol/L (ref 3.5–5.1)
Potassium: 4.2 mmol/L (ref 3.5–5.1)
SODIUM: 131 mmol/L — AB (ref 135–145)
SODIUM: 134 mmol/L — AB (ref 135–145)
Sodium: 134 mmol/L — ABNORMAL LOW (ref 135–145)

## 2017-03-23 LAB — CBC WITH DIFFERENTIAL/PLATELET
BASOS ABS: 0 10*3/uL (ref 0.0–0.1)
BASOS PCT: 0 %
EOS ABS: 0.3 10*3/uL (ref 0.0–0.7)
Eosinophils Relative: 3 %
HCT: 38.4 % — ABNORMAL LOW (ref 39.0–52.0)
HEMOGLOBIN: 13.1 g/dL (ref 13.0–17.0)
Lymphocytes Relative: 17 %
Lymphs Abs: 1.7 10*3/uL (ref 0.7–4.0)
MCH: 28.2 pg (ref 26.0–34.0)
MCHC: 34.1 g/dL (ref 30.0–36.0)
MCV: 82.8 fL (ref 78.0–100.0)
MONOS PCT: 8 %
Monocytes Absolute: 0.8 10*3/uL (ref 0.1–1.0)
NEUTROS PCT: 72 %
Neutro Abs: 6.9 10*3/uL (ref 1.7–7.7)
Platelets: 199 10*3/uL (ref 150–400)
RBC: 4.64 MIL/uL (ref 4.22–5.81)
RDW: 14.9 % (ref 11.5–15.5)
WBC: 9.7 10*3/uL (ref 4.0–10.5)

## 2017-03-23 LAB — SODIUM, URINE, RANDOM: Sodium, Ur: 37 mmol/L

## 2017-03-23 LAB — ECHOCARDIOGRAM COMPLETE
Height: 65 in
WEIGHTICAEL: 3308.66 [oz_av]

## 2017-03-23 LAB — OSMOLALITY, URINE: OSMOLALITY UR: 337 mosm/kg (ref 300–900)

## 2017-03-23 LAB — OSMOLALITY: Osmolality: 282 mOsm/kg (ref 275–295)

## 2017-03-23 LAB — CREATININE, URINE, RANDOM: Creatinine, Urine: 75.5 mg/dL

## 2017-03-23 MED ORDER — SODIUM CHLORIDE 0.9 % IV SOLN
INTRAVENOUS | Status: AC
  Administered 2017-03-23: 12:00:00 via INTRAVENOUS

## 2017-03-23 NOTE — Evaluation (Signed)
Physical Therapy Evaluation Patient Details Name: Steven Cain MRN: [id number] DOB: [birth year] Today's Date: 03/23/2017   History of Present Illness  83 yo male admitted with hyponatremia, orthostasis, fall. Hx of HTN, anxiety, vertigo, L THA-DA 05/2016  Clinical Impression  On eval, pt required Min assist for mobility. He walked ~60 feet with a rollator. He denies dizziness/lightheadedness but he c/o fatigue, dyspnea. See vitals section for BP readings. Pt presents with general weakness, decreased activity tolerance, and impaired gait and balance. Recommend HHPT and 24 hour supervision/assist until pt returns to baseline. Will follow during hospital stay.     Follow Up Recommendations Home health PT;Supervision/Assistance - 24 hour    Equipment Recommendations  None recommended by PT    Recommendations for Other Services       Precautions / Restrictions Precautions Precautions: Fall Precaution Comments: + orthostatitics Restrictions Weight Bearing Restrictions: No      Mobility  Bed Mobility Overal bed mobility: Needs Assistance Bed Mobility: Supine to Sit     Supine to sit: Min assist;HOB elevated     General bed mobility comments: Assist for trunk to upright. Increased time. Pt relied on bedrail  Transfers Overall transfer level: Needs assistance Equipment used: 4-wheeled walker Transfers: Sit to/from Stand Sit to Stand: Min assist         General transfer comment: Multiple attempt to get to standing unassisted. Required small amount of assist to rise, stabilize. VCs hand placement.   Ambulation/Gait Ambulation/Gait assistance: Min assist Ambulation Distance (Feet): 60 Feet Assistive device: 4-wheeled walker Gait Pattern/deviations: Step-through pattern;Decreased stride length     General Gait Details: Pt denied lightheadedness/dizziness. He fatigues easily/quickly. O2 sat >90% on RA, dyspnea 2/4.   Stairs            Wheelchair Mobility     Modified Rankin (Stroke Patients Only)       Balance Overall balance assessment: Needs assistance;History of Falls         Standing balance support: Bilateral upper extremity supported Standing balance-Leahy Scale: Poor Standing balance comment: currently requiring RW                             Pertinent Vitals/Pain Pain Assessment: No/denies pain    Home Living Family/patient expects to be discharged to:: Private residence Living Arrangements: Spouse/significant other   Type of Home: House Home Access: Stairs to enter Entrance Stairs-Rails: Right Entrance Stairs-Number of Steps: 3 Home Layout: One level Home Equipment: Walker - 4 wheels;Grab bars - tub/shower;Cane - single point      Prior Function Level of Independence: Needs assistance   Gait / Transfers Assistance Needed: ambulatory without a device at baseline     Comments: wife assisted with LB adls due to back pain     Hand Dominance        Extremity/Trunk Assessment   Upper Extremity Assessment Upper Extremity Assessment: Generalized weakness    Lower Extremity Assessment Lower Extremity Assessment: Generalized weakness    Cervical / Trunk Assessment Cervical / Trunk Assessment: Normal  Communication   Communication: No difficulties  Cognition Arousal/Alertness: Awake/alert Behavior During Therapy: WFL for tasks assessed/performed Overall Cognitive Status: Within Functional Limits for tasks assessed                                 General Comments: may have some short-term memory issues  General Comments      Exercises     Assessment/Plan    PT Assessment Patient needs continued PT services  PT Problem List Decreased strength;Decreased mobility;Decreased activity tolerance;Decreased balance;Decreased knowledge of use of DME       PT Treatment Interventions DME instruction;Gait training;Therapeutic activities;Therapeutic exercise;Patient/family  education;Balance training;Functional mobility training    PT Goals (Current goals can be found in the Care Plan section)  Acute Rehab PT Goals Patient Stated Goal: home PT Goal Formulation: With patient Time For Goal Achievement: 04/06/17 Potential to Achieve Goals: Good    Frequency Min 3X/week   Barriers to discharge        Co-evaluation               AM-PAC PT "6 Clicks" Daily Activity  Outcome Measure Difficulty turning over in bed (including adjusting bedclothes, sheets and blankets)?: Total Difficulty moving from lying on back to sitting on the side of the bed? : Total Difficulty sitting down on and standing up from a chair with arms (e.g., wheelchair, bedside commode, etc,.)?: Total Help needed moving to and from a bed to chair (including a wheelchair)?: A Little Help needed walking in hospital room?: A Little Help needed climbing 3-5 steps with a railing? : A Little 6 Click Score: 12    End of Session Equipment Utilized During Treatment: Gait belt Activity Tolerance: Patient tolerated treatment well Patient left: in bed;with call bell/phone within reach;with bed alarm set   PT Visit Diagnosis: Muscle weakness (generalized) (M62.81);Difficulty in walking, not elsewhere classified (R26.2)    Time: 5997-7414 PT Time Calculation (min) (ACUTE ONLY): 27 min   Charges:   PT Evaluation $PT Eval Low Complexity: 1 Low PT Treatments $Gait Training: 8-22 mins   PT G Codes:          Weston Anna, MPT Pager: [phone]

## 2017-03-23 NOTE — Progress Notes (Signed)
  Echocardiogram 2D Echocardiogram has been performed.  Steven Cain 03/23/2017, 11:01 AM

## 2017-03-23 NOTE — Progress Notes (Addendum)
PROGRESS NOTE    Steven Cain  UUV:[id number] DOB: 01/25/1931 DOA: 03/22/2017 PCP: Shon Baton, MD    Brief Narrative:  83 yo M with PMH of anxiety, HTN, chronic ureteral stricture with stent who presented to the ED for LH, weakness, and malaise after experiencing presyncope and recurrent falls with hyponatremia and UA suggestive of Steven Cain UTI.     Assessment & Plan:   Principal Problem:   Orthostasis Active Problems:   UTI (urinary tract infection)   CKD (chronic kidney disease) stage 3, GFR 30-59 ml/min   Hyponatremia   Essential hypertension   Presyncope/Orthostatic Hypotension: Improved after IVF, but impressive orthostasis noted yesterday evening with drop from 474 systolic to 259 systolic.  In the setting of hyponatremia and improvemet with volume, suspect this is 2/2 hypovolemia, though he does not note decreased intake, autonomic dysfunction also possible. Continue telemetry Echo pending today Repeat orthostatics Will order PT Addendum:  After discussing with his wife, she notes he may eat 1 meal Steven Cain day and thinks he's had decreased PO intake over the past few days.  This supports hypovolemia as cause for his orthostatic hypotension.  Hyponatremia: Improved with IVF, will continue 125 ml/hr for additional 10 hrs given improvement.   Has been drinking lots of club soda daily recently, which could contribute. Serum osms support this at 282.  Given orthostasis, likely volume down and will continue IVF administration.  Urine studies show Urine Na greater than 20, but he was on thiazide.  Will follow up urine urea and calculate FeUrea.   Holding HCTZ, paxil decreased to BID from TID.   UTI: - continue ceftriaxone (8/9-) - follow up urine cultures, will need outpatient urology f/u with his ureteral stent  CKD stage III - Cr at baseline - CTM  HTN - holding HCTZ - received hydralazine IV once, will discontinue this.  Consider treating if symptomatic.  Anxiety - Paxil 40  TID decreased to BID   DVT prophylaxis: heparin Code Status: full Family Communication: plan to call daughter Disposition Plan: likely 8/11 if improving   Consultants:   PT/OT  Procedures:   Echo pending  Antimicrobials:   Ceftriaxone (8/9- )    Subjective: No fevers, chills, night sweats.  Lightheadedness is improved, with IVF he notes but he hasn't gotten stood up yet.  In general, notes Steven Cain little nausea and feels Steven Cain bit off balance.  Denies problem with appetite.    Objective: Vitals:   03/22/17 2122 03/22/17 2252 03/23/17 0531 03/23/17 0743  BP: 100/76 (!) 157/69 (!) 172/77 (!) 141/66  Pulse: 96 70 79 84  Resp:  16 16   Temp:  97.7 F (36.5 C) 98.2 F (36.8 C)   TempSrc:  Oral Oral   SpO2: 97% 100% 96%   Weight:  93.1 kg (205 lb 4 oz) 93.8 kg (206 lb 12.7 oz)   Height:  5\' 5"  (1.651 m)      Intake/Output Summary (Last 24 hours) at 03/23/17 1151 Last data filed at 03/23/17 0900  Gross per 24 hour  Intake             1290 ml  Output              525 ml  Net              765 ml   Filed Weights   03/22/17 2252 03/23/17 0531  Weight: 93.1 kg (205 lb 4 oz) 93.8 kg (206 lb 12.7 oz)    Examination:  General exam: Appears calm and comfortable  Respiratory system: Clear to auscultation. Respiratory effort normal. Cardiovascular system: S1 & S2 heard, RRR. No JVD, murmurs, rubs, gallops or clicks. No pedal edema. Gastrointestinal system: Abdomen is nondistended, soft and nontender. No organomegaly or masses felt. Normal bowel sounds heard. Central nervous system: Alert and oriented. No focal neurological deficits. Extremities: 1+ LE edema bilaterally Skin: Erythema bilaterally to lower legs Psychiatry: Judgement and insight appear normal. Mood & affect appropriate.     Data Reviewed: I have personally reviewed following labs and imaging studies  CBC:  Recent Labs Lab 03/22/17 1808 03/23/17 0209  WBC 14.1* 9.7  NEUTROABS  --  6.9  HGB 12.8* 13.1  HCT  37.0* 38.4*  MCV 81.5 82.8  PLT 224 494   Basic Metabolic Panel:  Recent Labs Lab 03/22/17 1808 03/23/17 0209 03/23/17 0948  NA 126* 131* 134*  K 3.8 3.9 3.8  CL 91* 96* 98*  CO2 26 26 25   GLUCOSE 142* 108* 139*  BUN 20 19 18   CREATININE 1.65* 1.69* 1.67*  CALCIUM 8.9 8.9 8.9   GFR: Estimated Creatinine Clearance: 34.7 mL/min ([name]) (by C-G formula based on SCr of 1.67 mg/dL (H)). Liver Function Tests:  Recent Labs Lab 03/22/17 1808  AST 38  ALT 13*  ALKPHOS 91  BILITOT 0.5  PROT 7.7  ALBUMIN 4.0   No results for input(s): LIPASE, AMYLASE in the last 168 hours. No results for input(s): AMMONIA in the last 168 hours. Coagulation Profile: No results for input(s): INR, PROTIME in the last 168 hours. Cardiac Enzymes: No results for input(s): CKTOTAL, CKMB, CKMBINDEX, TROPONINI in the last 168 hours. BNP (last 3 results) No results for input(s): PROBNP in the last 8760 hours. HbA1C: No results for input(s): HGBA1C in the last 72 hours. CBG:  Recent Labs Lab 03/22/17 1743  GLUCAP 137*   Lipid Profile: No results for input(s): CHOL, HDL, LDLCALC, TRIG, CHOLHDL, LDLDIRECT in the last 72 hours. Thyroid Function Tests: No results for input(s): TSH, T4TOTAL, FREET4, T3FREE, THYROIDAB in the last 72 hours. Anemia Panel: No results for input(s): VITAMINB12, FOLATE, FERRITIN, TIBC, IRON, RETICCTPCT in the last 72 hours. Sepsis Labs: No results for input(s): PROCALCITON, LATICACIDVEN in the last 168 hours.  No results found for this or any previous visit (from the past 240 hour(s)).       Radiology Studies: No results found.  Scheduled Meds: . aspirin  325 mg Oral Daily  . B-complex with vitamin C  1 tablet Oral Daily  . heparin subcutaneous  5,000 Units Subcutaneous Q8H  . multivitamin with minerals  1 tablet Oral Daily  . omega-3 acid ethyl esters  1 g Oral Daily  . PARoxetine  40 mg Oral BID  . psyllium  1 packet Oral Daily  . sodium chloride flush  3 mL  Intravenous Q12H  . vitamin C  1,000 mg Oral Daily  . vitamin E  100 Units Oral Daily   Continuous Infusions: . sodium chloride    . cefTRIAXone (ROCEPHIN)  IV       LOS: 0 days    Time spent: 25 minutes   Steven Cain Steven Sartorius, MD [facility] Pager [phone]  If 7PM-7AM, please contact night-coverage [email or web address] Password [email or web address] 03/23/2017, 11:51 AM

## 2017-03-24 DIAGNOSIS — I951 Orthostatic hypotension: Principal | ICD-10-CM

## 2017-03-24 LAB — BASIC METABOLIC PANEL
Anion gap: 7 (ref 5–15)
BUN: 16 mg/dL (ref 6–20)
CALCIUM: 8.6 mg/dL — AB (ref 8.9–10.3)
CO2: 27 mmol/L (ref 22–32)
CREATININE: 1.6 mg/dL — AB (ref 0.61–1.24)
Chloride: 100 mmol/L — ABNORMAL LOW (ref 101–111)
GFR calc non Af Amer: 38 mL/min — ABNORMAL LOW (ref >60)
GFR, EST AFRICAN AMERICAN: 44 mL/min — AB (ref >60)
GLUCOSE: 99 mg/dL (ref 65–99)
Potassium: 4.1 mmol/L (ref 3.5–5.1)
Sodium: 134 mmol/L — ABNORMAL LOW (ref 135–145)

## 2017-03-24 LAB — UREA NITROGEN, URINE: Urea Nitrogen, Ur: 423 mg/dL

## 2017-03-24 MED ORDER — CEFPODOXIME PROXETIL 200 MG PO TABS
200.0000 mg | ORAL_TABLET | Freq: Two times a day (BID) | ORAL | Status: DC
  Administered 2017-03-24: 200 mg via ORAL
  Filled 2017-03-24: qty 1

## 2017-03-24 MED ORDER — AMLODIPINE BESYLATE 5 MG PO TABS
5.0000 mg | ORAL_TABLET | Freq: Every day | ORAL | Status: DC
  Administered 2017-03-24: 5 mg via ORAL
  Filled 2017-03-24: qty 1

## 2017-03-24 MED ORDER — CEFPODOXIME PROXETIL 200 MG PO TABS: 200.0000 mg | ORAL_TABLET | Freq: Two times a day (BID) | ORAL | 0 refills | Status: AC

## 2017-03-24 MED ORDER — CEFPODOXIME PROXETIL 200 MG PO TABS
200.0000 mg | ORAL_TABLET | Freq: Two times a day (BID) | ORAL | Status: DC
  Filled 2017-03-24: qty 1

## 2017-03-24 NOTE — Care Management Note (Signed)
Case Management Note  Patient Details  Name: Steven Cain MRN: [id number] Date of Birth: 1931/07/29  Subjective/Objective:  Orthostatsis, UTI, CKD, HTN                  Action/Plan: Discharge Planning: NCM spoke to pt and wife, [name] at bedside. Offered choice for HH/list provided. Pt states he had [facility] in the past. Has RW and bedside commode at home from previous hip surgery. Contacted [facility] with new referral for HHPT/OT.   PCP Shon Baton   Expected Discharge Date:  03/24/17               Expected Discharge Plan:  [place]  In-House Referral:  NA  Discharge planning Services  CM Consult  Post Acute Care Choice:  Home Health Choice offered to:  Spouse  DME Arranged:  N/A DME Agency:  NA  HH Arranged:  PT, OT HH Agency:  [facility]  Status of Service:  Completed, signed off  If discussed at [place] of Stay Meetings, dates discussed:    Additional Comments:  Erenest Rasher, RN 03/24/2017, 11:06 AM

## 2017-03-24 NOTE — Discharge Instructions (Signed)
Hyponatremia Hyponatremia is when the amount of salt (sodium) in your blood is too low. When salt levels are low, your cells absorb extra water and they swell. The swelling happens throughout the body, but it mostly affects the brain. Follow these instructions at home:  Take medicines only as told by your doctor. Many medicines can make this condition worse. Talk with your doctor about any medicines that you are currently taking.  Carefully follow a recommended diet as told by your doctor.  Carefully follow instructions from your doctor about fluid restrictions.  Keep all follow-up visits as told by your doctor. This is important.  Do not drink alcohol. Contact a doctor if:  You feel sicker to your stomach (nauseous).  You feel more confused.  You feel more tired (fatigued).  Your headache gets worse.  You feel weaker.  Your symptoms go away and then they come back.  You have trouble following the diet instructions. Get help right away if:  You start to twitch and shake (have a seizure).  You pass out (faint).  You keep having watery poop (diarrhea).  You keep throwing up (vomiting). This information is not intended to replace advice given to you by your health care provider. Make sure you discuss any questions you have with your health care provider. Document Released: 04/12/2011 Document Revised: 01/06/2016 Document Reviewed: 07/27/2014 Elsevier Interactive Patient Education  2018 [employer].   Orthostatic Hypotension Orthostatic hypotension is a sudden drop in blood pressure that happens when you quickly change positions, such as when you get up from a seated or lying position. Blood pressure is a measurement of how strongly, or weakly, your blood is pressing against the walls of your arteries. Arteries are blood vessels that carry blood from your heart throughout your body. When blood pressure is too low, you may not get enough blood to your brain or to the rest of  your organs. This can cause weakness, light-headedness, rapid heartbeat, and fainting. This can last for just a few seconds or for up to a few minutes. Orthostatic hypotension is usually not a serious problem. However, if it happens frequently or gets worse, it may be a sign of something more serious. What are the causes? This condition may be caused by:  Sudden changes in posture, such as standing up quickly after you have been sitting or lying down.  Blood loss.  Loss of body fluids (dehydration).  Heart problems.  Hormone (endocrine) problems.  Pregnancy.  Severe infection.  Lack of certain nutrients.  Severe allergic reactions (anaphylaxis).  Certain medicines, such as blood pressure medicine or medicines that make the body lose excess fluids (diuretics). Sometimes, this condition can be caused by not taking medicine as directed, such as taking too much of a certain medicine.  What increases the risk? Certain factors can make you more likely to develop orthostatic hypotension, including:  Age. Risk increases as you get older.  Conditions that affect the heart or the central nervous system.  Taking certain medicines, such as blood pressure medicine or diuretics.  Being pregnant.  What are the signs or symptoms? Symptoms of this condition may include:  Weakness.  Light-headedness.  Dizziness.  Blurred vision.  Fatigue.  Rapid heartbeat.  Fainting, in severe cases.  How is this diagnosed? This condition is diagnosed based on:  Your medical history.  Your symptoms.  Your blood pressure measurement. Your health care provider will check your blood pressure when you are: ? Lying down. ? Sitting. ? Standing.  A blood pressure reading is recorded as two numbers, such as "120 over 80" (or 120/80). The first ("top") number is called the systolic pressure. It is a measure of the pressure in your arteries as your heart beats. The second ("bottom") number is  called the diastolic pressure. It is a measure of the pressure in your arteries when your heart relaxes between beats. Blood pressure is measured in a unit called mm Hg. Healthy blood pressure for adults is 120/80. If your blood pressure is below 90/60, you may be diagnosed with hypotension. Other information or tests that may be used to diagnose orthostatic hypotension include:  Your other vital signs, such as your heart rate and temperature.  Blood tests.  Tilt table test. For this test, you will be safely secured to a table that moves you from a lying position to an upright position. Your heart rhythm and blood pressure will be monitored during the test.  How is this treated? Treatment for this condition may include:  Changing your diet. This may involve eating more salt (sodium) or drinking more water.  Taking medicines to raise your blood pressure.  Changing the dosage of certain medicines you are taking that might be lowering your blood pressure.  Wearing compression stockings. These stockings help to prevent blood clots and reduce swelling in your legs.  In some cases, you may need to go to the hospital for:  Fluid replacement. This means you will receive fluids through an IV tube.  Blood replacement. This means you will receive donated blood through an IV tube (transfusion).  Treating an infection or heart problems, if this applies.  Monitoring. You may need to be monitored while medicines that you are taking wear off.  Follow these instructions at home: Eating and drinking   Drink enough fluid to keep your urine clear or pale yellow.  Eat a healthy diet and follow instructions from your health care provider about eating or drinking restrictions. A healthy diet includes: ? Fresh fruits and vegetables. ? Whole grains. ? Lean meats. ? Low-fat dairy products.  Eat extra salt only as directed. Do not add extra salt to your diet unless your health care provider told you  to do that.  Eat frequent, small meals.  Avoid standing up suddenly after eating. Medicines  Take over-the-counter and prescription medicines only as told by your health care provider. ? Follow instructions from your health care provider about changing the dosage of your current medicines, if this applies. ? Do not stop or adjust any of your medicines on your own. General instructions  Wear compression stockings as told by your health care provider.  Get up slowly from lying down or sitting positions. This gives your blood pressure a chance to adjust.  Avoid hot showers and excessive heat as directed by your health care provider.  Return to your normal activities as told by your health care provider. Ask your health care provider what activities are safe for you.  Do not use any products that contain nicotine or tobacco, such as cigarettes and e-cigarettes. If you need help quitting, ask your health care provider.  Keep all follow-up visits as told by your health care provider. This is important. Contact a health care provider if:  You vomit.  You have diarrhea.  You have a fever for more than 2-3 days.  You feel more thirsty than usual.  You feel weak and tired. Get help right away if:  You have chest pain.  You have  a fast or irregular heartbeat.  You develop numbness in any part of your body.  You cannot move your arms or your legs.  You have trouble speaking.  You become sweaty or feel lightheaded.  You faint.  You feel short of breath.  You have trouble staying awake.  You feel confused. This information is not intended to replace advice given to you by your health care provider. Make sure you discuss any questions you have with your health care provider. Document Released: 07/21/2002 Document Revised: 04/18/2016 Document Reviewed: 01/21/2016 Elsevier Interactive Patient Education  2018 [employer].

## 2017-03-24 NOTE — Discharge Summary (Signed)
[facility]  Physician Discharge Summary   Patient ID: Steven Cain MRN: [id number] DOB/AGE: 1931/06/20 83 y.o.  Admit date: 03/22/2017 Discharge date: 03/24/2017  PCP: Shon Baton, MD  DISCHARGE DIAGNOSES:  Principal Problem:   Orthostasis Active Problems:   UTI (urinary tract infection)   CKD (chronic kidney disease) stage 3, GFR 30-59 ml/min   Hyponatremia   Essential hypertension   Syncope due to orthostatic hypotension   RECOMMENDATIONS FOR OUTPATIENT FOLLOW UP: 1. Patient instructed to follow-up with his primary care provider in the next few days for blood work and blood pressure check. 2. 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 Found to have hyponatremia and UTI. He was hospitalized for further management.  Consultations:  None  Procedures: Transthoracic echocardiogram Study Conclusions  - Left ventricle: The cavity size was normal. There was moderate   concentric hypertrophy. Systolic function was normal. The   estimated ejection fraction was in the range of 55% to 60%. Wall   motion was normal; there were no regional wall motion   abnormalities. Doppler parameters are consistent with abnormal   left ventricular relaxation (grade 1 diastolic dysfunction).   Doppler parameters are consistent with elevated ventricular   end-diastolic filling pressure. - Aortic valve: Trileaflet; mildly thickened, mildly calcified   leaflets. There was mild regurgitation. - Aortic root: The aortic root was normal in size. - Left atrium: The atrium was normal in size. -  Right ventricle: The cavity size was normal. Wall thickness was   normal. Systolic function was normal. - Tricuspid valve: There was mild regurgitation. - Pulmonic valve: There was no regurgitation. - Pulmonary arteries: Systolic pressure was within the normal   range. - Inferior vena cava: The vessel was normal in size. - Pericardium, extracardiac: There was no pericardial effusion.   HOSPITAL COURSE:   Orthostatic hypotension Patient was found to have orthostatic hypotension. Patient noted to be on diuretics. Thought to be due to hypovolemia. Apparently patient has had poor oral intake the last few days. Patient was hydrated. His hydrochlorothiazide was discontinued. He's feeling much better. Orthostatic vital signs have improved. He was asked to get up slowly from a sitting or lying position. This was also communicated to his daughter. Echocardiogram as above. No significant abnormalities noted. Patient was seen by physical therapy. Home health has been recommended, which will be ordered.  Hyponatremia. Possibly due to diuretics and poor oral intake the last few days. He was given IV fluids with improvement in sodium level. His hydrochlorothiazide will be discontinued. Patient instructed to follow-up with his primary care physician for labs next week.  Urinary tract infection. Patient was placed on ceftriaxone. Patient has a history of ureteral stricture with stent placement. His renal function is at baseline. Urine culture reports are pending. Patient has significantly improved and wishes to go home. Microbiology data was reviewed. Last positive culture is from 2014 when he grew Escherichia coli. Those sensitivities were reviewed. He will be changed over to oral Vantin. We will follow-up on results of the urine culture.  History of anxiety disorder. Patient is on high dose Paxil. He  has been on this dose for a long period of time. Unlikely this will be responsible for his hyponatremia. We  will not make any changes to the dose of his Paxil at this time. This can be pursued further by his outpatient providers.  Chronic kidney disease stage III. Creatinine is at baseline. Continue to monitor.  History of essential hypertension. Patient was on amlodipine and hydrochlorothiazide at home. HCTZ has been held due to hyponatremia. Continue amlodipine. Patient to follow-up with his PCP for blood pressure check and further management.  Overall improved. Wishes to go home. Discussed in detail with patient as well as his daughter who works as a Librarian, academic in the emergency department. Okay for discharge. Home health will be ordered.   PERTINENT LABS:  The results of significant diagnostics from this hospitalization (including imaging, microbiology, ancillary and laboratory) are listed below for reference.    Microbiology: Recent Results (from the past 240 hour(s))  Urine culture     Status: Abnormal (Preliminary result)   Collection Time: 03/22/17  6:44 PM  Result Value Ref Range Status   Specimen Description URINE, RANDOM  Final   Special Requests NONE  Final   Culture >=100,000 COLONIES/mL GRAM NEGATIVE RODS (A)  Final   Report Status PENDING  Incomplete     Labs: Basic Metabolic Panel:  Recent Labs Lab 03/22/17 1808 03/23/17 0209 03/23/17 0948 03/23/17 1916 03/24/17 0459  NA 126* 131* 134* 134* 134*  K 3.8 3.9 3.8 4.2 4.1  CL 91* 96* 98* 100* 100*  CO2 26 26 25 26 27   GLUCOSE 142* 108* 139* 116* 99  BUN 20 19 18 18 16   CREATININE 1.65* 1.69* 1.67* 1.63* 1.60*  CALCIUM 8.9 8.9 8.9 8.8* 8.6*   Liver Function Tests:  Recent Labs Lab 03/22/17 1808  AST 38  ALT 13*  ALKPHOS 91  BILITOT 0.5  PROT 7.7  ALBUMIN 4.0   CBC:  Recent Labs Lab 03/22/17 1808 03/23/17 0209  WBC 14.1* 9.7  NEUTROABS  --  6.9  HGB 12.8* 13.1  HCT 37.0* 38.4*  MCV 81.5 82.8  PLT 224 199   CBG:  Recent Labs Lab 03/22/17 1743  GLUCAP 137*     IMAGING  STUDIES No results found.  DISCHARGE EXAMINATION: Vitals:   03/23/17 0743 03/23/17 1347 03/23/17 2022 03/24/17 0436  BP: (!) 141/66 (!) 144/59 (!) 138/56 (!) 174/74  Pulse: 84 85 77 73  Resp:  18 18 18   Temp:  98.4 F (36.9 C) 97.7 F (36.5 C) 98 F (36.7 C)  TempSrc:  Oral Oral Oral  SpO2:  96% 98% 98%  Weight:    92 kg (202 lb 13.2 oz)  Height:       General appearance: alert, cooperative, appears stated age and no distress Resp: clear to auscultation bilaterally Cardio: regular rate and rhythm, S1, S2 normal, no murmur, click, rub or gallop GI: soft, non-tender; bowel sounds normal; no masses,  no organomegaly Extremities: extremities normal, atraumatic, no cyanosis or edema  DISPOSITION: Home with home health  Discharge Instructions    Call MD for:  difficulty breathing, headache or visual disturbances    Complete by:  As directed    Call MD for:  extreme fatigue    Complete by:  As directed    Call MD for:  persistant dizziness or light-headedness    Complete by:  As directed    Call MD for:  persistant nausea and vomiting    Complete by:  As  directed    Call MD for:  severe uncontrolled pain    Complete by:  As directed    Call MD for:  temperature >100.4    Complete by:  As directed    Diet - low sodium heart healthy    Complete by:  As directed    Discharge instructions    Complete by:  As directed    Please have your blood pressures checked next week along with blood work to check your sodium levels. Please get up slowly from a lying or sitting position and allow your body to get used to change in position before walking.  You were cared for by a hospitalist during your hospital stay. If you have any questions about your discharge medications or the care you received while you were in the hospital after you are discharged, you can call the unit and asked to speak with the hospitalist on call if the hospitalist that took care of you is not available. Once you are  discharged, your primary care physician will handle any further medical issues. Please note that NO REFILLS for any discharge medications will be authorized once you are discharged, as it is imperative that you return to your primary care physician (or establish a relationship with a primary care physician if you do not have one) for your aftercare needs so that they can reassess your need for medications and monitor your lab values. If you do not have a primary care physician, you can call [phone] for a physician referral.   Increase activity slowly    Complete by:  As directed       ALLERGIES: No Known Allergies   Discharge Medication List as of 03/24/2017 11:24 AM    START taking these medications   Details  cefpodoxime (VANTIN) 200 MG tablet Take 1 tablet (200 mg total) by mouth every 12 (twelve) hours. For 6 days, Starting Sat 03/24/2017, Until Fri 03/30/2017, Print      CONTINUE these medications which have NOT CHANGED   Details  amLODipine (NORVASC) 5 MG tablet Take 5 mg by mouth at bedtime., Starting Sat 02/17/2017, Historical Med    Ascorbic Acid (VITAMIN C) 1000 MG tablet Take 1,000 mg by mouth every morning. , Historical Med    aspirin EC 325 MG EC tablet Take 1 tablet (325 mg total) by mouth 2 (two) times daily after a meal., Starting Sun 05/21/2016, Print    b complex vitamins tablet Take 1 tablet by mouth daily., Historical Med    cyanocobalamin (,VITAMIN B-12,) 1000 MCG/ML injection Inject 1,000 mcg into the muscle every 30 (thirty) days., Historical Med    fish oil-omega-3 fatty acids 1000 MG capsule Take 2 g by mouth every morning. , Historical Med    meclizine (ANTIVERT) 25 MG tablet Take 25 mg by mouth 2 (two) times daily as needed for dizziness., Historical Med    Multiple Vitamin (MULTIVITAMIN WITH MINERALS) TABS tablet Take 1 tablet by mouth every morning., Historical Med    PARoxetine (PAXIL) 20 MG tablet Take 40 mg by mouth 3 (three) times daily. If sleepy only  take 1 tablet, Historical Med    psyllium (HYDROCIL/METAMUCIL) 95 % PACK Take 1 packet by mouth daily. , Historical Med    testosterone cypionate (DEPOTESTOTERONE CYPIONATE) 200 MG/ML injection Inject 60 mg into the muscle every 6 (six) weeks. 3/10 of cc., Historical Med    vitamin E 100 UNIT capsule Take 100 Units by mouth every morning. , Historical Med  STOP taking these medications     hydrochlorothiazide (HYDRODIURIL) 12.5 MG tablet      meloxicam (MOBIC) 15 MG tablet          Follow-up Information    Shon Baton, MD. Schedule an appointment as soon as possible for a visit in 4 day(s).   Specialty:  Internal Medicine Why:  for BP check and labs to check sodium levels Contact information: [address] [place] [address] [facility] up.   Why:  Home Health Physical Therapy and Occupational Therapy- agency will contact you to arrange initial visit Contact information: [address] [place] [address] [phone]           TOTAL DISCHARGE TIME: 35 minutes  [facility] Pager [phone]  03/24/2017, 1:39 PM

## 2017-03-24 NOTE — Care Management Important Message (Signed)
Important Message  Patient Details  Name: Steven Cain MRN: [id number] Date of Birth: Jul 11, 1931   Medicare Important Message Given:  Yes Signed copy   Erenest Rasher, RN 03/24/2017, 11:04 AM

## 2017-03-24 NOTE — Progress Notes (Signed)
Reviewed discharge information with patient and caregiver. Answered all questions. Patient and caregiver able to teach back medications and reasons to contact MD or 911. Patient verbalizes importance of PCP follow up appointment. 

## 2017-03-26 LAB — URINE CULTURE: Culture: >=100000 — AB

## 2017-03-27 DIAGNOSIS — F329 Major depressive disorder, single episode, unspecified: Secondary | ICD-10-CM | POA: Diagnosis not present

## 2017-03-27 DIAGNOSIS — N183 Chronic kidney disease, stage 3 (moderate): Secondary | ICD-10-CM | POA: Diagnosis not present

## 2017-03-27 DIAGNOSIS — N390 Urinary tract infection, site not specified: Secondary | ICD-10-CM | POA: Diagnosis not present

## 2017-03-27 DIAGNOSIS — Z7982 Long term (current) use of aspirin: Secondary | ICD-10-CM | POA: Diagnosis not present

## 2017-03-27 DIAGNOSIS — M1990 Unspecified osteoarthritis, unspecified site: Secondary | ICD-10-CM | POA: Diagnosis not present

## 2017-03-27 DIAGNOSIS — Z87440 Personal history of urinary (tract) infections: Secondary | ICD-10-CM | POA: Diagnosis not present

## 2017-03-27 DIAGNOSIS — I129 Hypertensive chronic kidney disease with stage 1 through stage 4 chronic kidney disease, or unspecified chronic kidney disease: Secondary | ICD-10-CM | POA: Diagnosis not present

## 2017-03-27 DIAGNOSIS — F419 Anxiety disorder, unspecified: Secondary | ICD-10-CM | POA: Diagnosis not present

## 2017-03-27 DIAGNOSIS — I951 Orthostatic hypotension: Secondary | ICD-10-CM | POA: Diagnosis not present

## 2017-03-27 DIAGNOSIS — M4800 Spinal stenosis, site unspecified: Secondary | ICD-10-CM | POA: Diagnosis not present

## 2017-03-29 DIAGNOSIS — E871 Hypo-osmolality and hyponatremia: Secondary | ICD-10-CM | POA: Diagnosis not present

## 2017-03-29 DIAGNOSIS — N183 Chronic kidney disease, stage 3 (moderate): Secondary | ICD-10-CM | POA: Diagnosis not present

## 2017-04-03 DIAGNOSIS — N390 Urinary tract infection, site not specified: Secondary | ICD-10-CM | POA: Diagnosis not present

## 2017-04-03 DIAGNOSIS — M4800 Spinal stenosis, site unspecified: Secondary | ICD-10-CM | POA: Diagnosis not present

## 2017-04-03 DIAGNOSIS — I951 Orthostatic hypotension: Secondary | ICD-10-CM | POA: Diagnosis not present

## 2017-04-03 DIAGNOSIS — N183 Chronic kidney disease, stage 3 (moderate): Secondary | ICD-10-CM | POA: Diagnosis not present

## 2017-04-03 DIAGNOSIS — I129 Hypertensive chronic kidney disease with stage 1 through stage 4 chronic kidney disease, or unspecified chronic kidney disease: Secondary | ICD-10-CM | POA: Diagnosis not present

## 2017-04-03 DIAGNOSIS — M1990 Unspecified osteoarthritis, unspecified site: Secondary | ICD-10-CM | POA: Diagnosis not present

## 2017-04-05 DIAGNOSIS — N390 Urinary tract infection, site not specified: Secondary | ICD-10-CM | POA: Diagnosis not present

## 2017-04-05 DIAGNOSIS — M4800 Spinal stenosis, site unspecified: Secondary | ICD-10-CM | POA: Diagnosis not present

## 2017-04-05 DIAGNOSIS — M1990 Unspecified osteoarthritis, unspecified site: Secondary | ICD-10-CM | POA: Diagnosis not present

## 2017-04-05 DIAGNOSIS — I129 Hypertensive chronic kidney disease with stage 1 through stage 4 chronic kidney disease, or unspecified chronic kidney disease: Secondary | ICD-10-CM | POA: Diagnosis not present

## 2017-04-05 DIAGNOSIS — I951 Orthostatic hypotension: Secondary | ICD-10-CM | POA: Diagnosis not present

## 2017-04-05 DIAGNOSIS — N183 Chronic kidney disease, stage 3 (moderate): Secondary | ICD-10-CM | POA: Diagnosis not present

## 2017-04-06 DIAGNOSIS — N183 Chronic kidney disease, stage 3 (moderate): Secondary | ICD-10-CM | POA: Diagnosis not present

## 2017-04-06 DIAGNOSIS — M1990 Unspecified osteoarthritis, unspecified site: Secondary | ICD-10-CM | POA: Diagnosis not present

## 2017-04-06 DIAGNOSIS — M4800 Spinal stenosis, site unspecified: Secondary | ICD-10-CM | POA: Diagnosis not present

## 2017-04-06 DIAGNOSIS — I951 Orthostatic hypotension: Secondary | ICD-10-CM | POA: Diagnosis not present

## 2017-04-06 DIAGNOSIS — I129 Hypertensive chronic kidney disease with stage 1 through stage 4 chronic kidney disease, or unspecified chronic kidney disease: Secondary | ICD-10-CM | POA: Diagnosis not present

## 2017-04-06 DIAGNOSIS — N390 Urinary tract infection, site not specified: Secondary | ICD-10-CM | POA: Diagnosis not present

## 2017-04-09 DIAGNOSIS — I129 Hypertensive chronic kidney disease with stage 1 through stage 4 chronic kidney disease, or unspecified chronic kidney disease: Secondary | ICD-10-CM | POA: Diagnosis not present

## 2017-04-09 DIAGNOSIS — N183 Chronic kidney disease, stage 3 (moderate): Secondary | ICD-10-CM | POA: Diagnosis not present

## 2017-04-09 DIAGNOSIS — I951 Orthostatic hypotension: Secondary | ICD-10-CM | POA: Diagnosis not present

## 2017-04-09 DIAGNOSIS — M1990 Unspecified osteoarthritis, unspecified site: Secondary | ICD-10-CM | POA: Diagnosis not present

## 2017-04-09 DIAGNOSIS — M4800 Spinal stenosis, site unspecified: Secondary | ICD-10-CM | POA: Diagnosis not present

## 2017-04-09 DIAGNOSIS — N390 Urinary tract infection, site not specified: Secondary | ICD-10-CM | POA: Diagnosis not present

## 2017-04-10 DIAGNOSIS — N390 Urinary tract infection, site not specified: Secondary | ICD-10-CM | POA: Diagnosis not present

## 2017-04-10 DIAGNOSIS — I951 Orthostatic hypotension: Secondary | ICD-10-CM | POA: Diagnosis not present

## 2017-04-10 DIAGNOSIS — N183 Chronic kidney disease, stage 3 (moderate): Secondary | ICD-10-CM | POA: Diagnosis not present

## 2017-04-10 DIAGNOSIS — I129 Hypertensive chronic kidney disease with stage 1 through stage 4 chronic kidney disease, or unspecified chronic kidney disease: Secondary | ICD-10-CM | POA: Diagnosis not present

## 2017-04-10 DIAGNOSIS — M1990 Unspecified osteoarthritis, unspecified site: Secondary | ICD-10-CM | POA: Diagnosis not present

## 2017-04-10 DIAGNOSIS — M4800 Spinal stenosis, site unspecified: Secondary | ICD-10-CM | POA: Diagnosis not present

## 2017-04-12 DIAGNOSIS — R7309 Other abnormal glucose: Secondary | ICD-10-CM | POA: Diagnosis not present

## 2017-04-12 DIAGNOSIS — E871 Hypo-osmolality and hyponatremia: Secondary | ICD-10-CM | POA: Diagnosis not present

## 2017-04-12 DIAGNOSIS — D692 Other nonthrombocytopenic purpura: Secondary | ICD-10-CM | POA: Diagnosis not present

## 2017-04-12 DIAGNOSIS — R600 Localized edema: Secondary | ICD-10-CM | POA: Diagnosis not present

## 2017-04-12 DIAGNOSIS — F325 Major depressive disorder, single episode, in full remission: Secondary | ICD-10-CM | POA: Diagnosis not present

## 2017-04-12 DIAGNOSIS — I129 Hypertensive chronic kidney disease with stage 1 through stage 4 chronic kidney disease, or unspecified chronic kidney disease: Secondary | ICD-10-CM | POA: Diagnosis not present

## 2017-04-12 DIAGNOSIS — I951 Orthostatic hypotension: Secondary | ICD-10-CM | POA: Diagnosis not present

## 2017-04-12 DIAGNOSIS — M4800 Spinal stenosis, site unspecified: Secondary | ICD-10-CM | POA: Diagnosis not present

## 2017-04-12 DIAGNOSIS — R627 Adult failure to thrive: Secondary | ICD-10-CM | POA: Diagnosis not present

## 2017-04-12 DIAGNOSIS — J42 Unspecified chronic bronchitis: Secondary | ICD-10-CM | POA: Diagnosis not present

## 2017-04-12 DIAGNOSIS — Z6833 Body mass index (BMI) 33.0-33.9, adult: Secondary | ICD-10-CM | POA: Diagnosis not present

## 2017-04-12 DIAGNOSIS — Z23 Encounter for immunization: Secondary | ICD-10-CM | POA: Diagnosis not present

## 2017-04-12 DIAGNOSIS — I878 Other specified disorders of veins: Secondary | ICD-10-CM | POA: Diagnosis not present

## 2017-04-12 DIAGNOSIS — N390 Urinary tract infection, site not specified: Secondary | ICD-10-CM | POA: Diagnosis not present

## 2017-04-12 DIAGNOSIS — M1990 Unspecified osteoarthritis, unspecified site: Secondary | ICD-10-CM | POA: Diagnosis not present

## 2017-04-12 DIAGNOSIS — N183 Chronic kidney disease, stage 3 (moderate): Secondary | ICD-10-CM | POA: Diagnosis not present

## 2017-04-13 DIAGNOSIS — M4800 Spinal stenosis, site unspecified: Secondary | ICD-10-CM | POA: Diagnosis not present

## 2017-04-13 DIAGNOSIS — I951 Orthostatic hypotension: Secondary | ICD-10-CM | POA: Diagnosis not present

## 2017-04-13 DIAGNOSIS — I129 Hypertensive chronic kidney disease with stage 1 through stage 4 chronic kidney disease, or unspecified chronic kidney disease: Secondary | ICD-10-CM | POA: Diagnosis not present

## 2017-04-13 DIAGNOSIS — N390 Urinary tract infection, site not specified: Secondary | ICD-10-CM | POA: Diagnosis not present

## 2017-04-13 DIAGNOSIS — N183 Chronic kidney disease, stage 3 (moderate): Secondary | ICD-10-CM | POA: Diagnosis not present

## 2017-04-13 DIAGNOSIS — M1990 Unspecified osteoarthritis, unspecified site: Secondary | ICD-10-CM | POA: Diagnosis not present

## 2017-04-16 DIAGNOSIS — N183 Chronic kidney disease, stage 3 (moderate): Secondary | ICD-10-CM | POA: Diagnosis not present

## 2017-04-16 DIAGNOSIS — M1990 Unspecified osteoarthritis, unspecified site: Secondary | ICD-10-CM | POA: Diagnosis not present

## 2017-04-16 DIAGNOSIS — N390 Urinary tract infection, site not specified: Secondary | ICD-10-CM | POA: Diagnosis not present

## 2017-04-16 DIAGNOSIS — M4800 Spinal stenosis, site unspecified: Secondary | ICD-10-CM | POA: Diagnosis not present

## 2017-04-16 DIAGNOSIS — I951 Orthostatic hypotension: Secondary | ICD-10-CM | POA: Diagnosis not present

## 2017-04-16 DIAGNOSIS — I129 Hypertensive chronic kidney disease with stage 1 through stage 4 chronic kidney disease, or unspecified chronic kidney disease: Secondary | ICD-10-CM | POA: Diagnosis not present

## 2017-04-18 DIAGNOSIS — N390 Urinary tract infection, site not specified: Secondary | ICD-10-CM | POA: Diagnosis not present

## 2017-04-18 DIAGNOSIS — I129 Hypertensive chronic kidney disease with stage 1 through stage 4 chronic kidney disease, or unspecified chronic kidney disease: Secondary | ICD-10-CM | POA: Diagnosis not present

## 2017-04-18 DIAGNOSIS — M4800 Spinal stenosis, site unspecified: Secondary | ICD-10-CM | POA: Diagnosis not present

## 2017-04-18 DIAGNOSIS — I951 Orthostatic hypotension: Secondary | ICD-10-CM | POA: Diagnosis not present

## 2017-04-18 DIAGNOSIS — N183 Chronic kidney disease, stage 3 (moderate): Secondary | ICD-10-CM | POA: Diagnosis not present

## 2017-04-18 DIAGNOSIS — M1990 Unspecified osteoarthritis, unspecified site: Secondary | ICD-10-CM | POA: Diagnosis not present

## 2017-04-19 DIAGNOSIS — I951 Orthostatic hypotension: Secondary | ICD-10-CM | POA: Diagnosis not present

## 2017-04-19 DIAGNOSIS — N183 Chronic kidney disease, stage 3 (moderate): Secondary | ICD-10-CM | POA: Diagnosis not present

## 2017-04-19 DIAGNOSIS — N390 Urinary tract infection, site not specified: Secondary | ICD-10-CM | POA: Diagnosis not present

## 2017-04-19 DIAGNOSIS — M1990 Unspecified osteoarthritis, unspecified site: Secondary | ICD-10-CM | POA: Diagnosis not present

## 2017-04-19 DIAGNOSIS — M4800 Spinal stenosis, site unspecified: Secondary | ICD-10-CM | POA: Diagnosis not present

## 2017-04-19 DIAGNOSIS — I129 Hypertensive chronic kidney disease with stage 1 through stage 4 chronic kidney disease, or unspecified chronic kidney disease: Secondary | ICD-10-CM | POA: Diagnosis not present

## 2017-04-22 DIAGNOSIS — N183 Chronic kidney disease, stage 3 (moderate): Secondary | ICD-10-CM | POA: Diagnosis not present

## 2017-04-22 DIAGNOSIS — N390 Urinary tract infection, site not specified: Secondary | ICD-10-CM | POA: Diagnosis not present

## 2017-04-22 DIAGNOSIS — E871 Hypo-osmolality and hyponatremia: Secondary | ICD-10-CM | POA: Diagnosis not present

## 2017-04-22 DIAGNOSIS — Z6833 Body mass index (BMI) 33.0-33.9, adult: Secondary | ICD-10-CM | POA: Diagnosis not present

## 2017-04-22 DIAGNOSIS — I878 Other specified disorders of veins: Secondary | ICD-10-CM | POA: Diagnosis not present

## 2017-04-22 DIAGNOSIS — R600 Localized edema: Secondary | ICD-10-CM | POA: Diagnosis not present

## 2017-04-22 DIAGNOSIS — I10 Essential (primary) hypertension: Secondary | ICD-10-CM | POA: Diagnosis not present

## 2017-04-23 DIAGNOSIS — M4800 Spinal stenosis, site unspecified: Secondary | ICD-10-CM | POA: Diagnosis not present

## 2017-04-23 DIAGNOSIS — N183 Chronic kidney disease, stage 3 (moderate): Secondary | ICD-10-CM | POA: Diagnosis not present

## 2017-04-23 DIAGNOSIS — I129 Hypertensive chronic kidney disease with stage 1 through stage 4 chronic kidney disease, or unspecified chronic kidney disease: Secondary | ICD-10-CM | POA: Diagnosis not present

## 2017-04-23 DIAGNOSIS — I951 Orthostatic hypotension: Secondary | ICD-10-CM | POA: Diagnosis not present

## 2017-04-23 DIAGNOSIS — M1990 Unspecified osteoarthritis, unspecified site: Secondary | ICD-10-CM | POA: Diagnosis not present

## 2017-04-23 DIAGNOSIS — N390 Urinary tract infection, site not specified: Secondary | ICD-10-CM | POA: Diagnosis not present

## 2017-04-25 DIAGNOSIS — N183 Chronic kidney disease, stage 3 (moderate): Secondary | ICD-10-CM | POA: Diagnosis not present

## 2017-04-25 DIAGNOSIS — N390 Urinary tract infection, site not specified: Secondary | ICD-10-CM | POA: Diagnosis not present

## 2017-04-25 DIAGNOSIS — M1990 Unspecified osteoarthritis, unspecified site: Secondary | ICD-10-CM | POA: Diagnosis not present

## 2017-04-25 DIAGNOSIS — M4800 Spinal stenosis, site unspecified: Secondary | ICD-10-CM | POA: Diagnosis not present

## 2017-04-25 DIAGNOSIS — I951 Orthostatic hypotension: Secondary | ICD-10-CM | POA: Diagnosis not present

## 2017-04-25 DIAGNOSIS — I129 Hypertensive chronic kidney disease with stage 1 through stage 4 chronic kidney disease, or unspecified chronic kidney disease: Secondary | ICD-10-CM | POA: Diagnosis not present

## 2017-04-30 DIAGNOSIS — N183 Chronic kidney disease, stage 3 (moderate): Secondary | ICD-10-CM | POA: Diagnosis not present

## 2017-04-30 DIAGNOSIS — I129 Hypertensive chronic kidney disease with stage 1 through stage 4 chronic kidney disease, or unspecified chronic kidney disease: Secondary | ICD-10-CM | POA: Diagnosis not present

## 2017-04-30 DIAGNOSIS — M4800 Spinal stenosis, site unspecified: Secondary | ICD-10-CM | POA: Diagnosis not present

## 2017-04-30 DIAGNOSIS — I951 Orthostatic hypotension: Secondary | ICD-10-CM | POA: Diagnosis not present

## 2017-04-30 DIAGNOSIS — M1990 Unspecified osteoarthritis, unspecified site: Secondary | ICD-10-CM | POA: Diagnosis not present

## 2017-04-30 DIAGNOSIS — N390 Urinary tract infection, site not specified: Secondary | ICD-10-CM | POA: Diagnosis not present

## 2017-05-02 DIAGNOSIS — I129 Hypertensive chronic kidney disease with stage 1 through stage 4 chronic kidney disease, or unspecified chronic kidney disease: Secondary | ICD-10-CM | POA: Diagnosis not present

## 2017-05-02 DIAGNOSIS — N390 Urinary tract infection, site not specified: Secondary | ICD-10-CM | POA: Diagnosis not present

## 2017-05-02 DIAGNOSIS — I951 Orthostatic hypotension: Secondary | ICD-10-CM | POA: Diagnosis not present

## 2017-05-02 DIAGNOSIS — M4800 Spinal stenosis, site unspecified: Secondary | ICD-10-CM | POA: Diagnosis not present

## 2017-05-02 DIAGNOSIS — M1990 Unspecified osteoarthritis, unspecified site: Secondary | ICD-10-CM | POA: Diagnosis not present

## 2017-05-02 DIAGNOSIS — N183 Chronic kidney disease, stage 3 (moderate): Secondary | ICD-10-CM | POA: Diagnosis not present

## 2017-05-04 DIAGNOSIS — N183 Chronic kidney disease, stage 3 (moderate): Secondary | ICD-10-CM | POA: Diagnosis not present

## 2017-05-04 DIAGNOSIS — I129 Hypertensive chronic kidney disease with stage 1 through stage 4 chronic kidney disease, or unspecified chronic kidney disease: Secondary | ICD-10-CM | POA: Diagnosis not present

## 2017-05-04 DIAGNOSIS — I951 Orthostatic hypotension: Secondary | ICD-10-CM | POA: Diagnosis not present

## 2017-05-04 DIAGNOSIS — N390 Urinary tract infection, site not specified: Secondary | ICD-10-CM | POA: Diagnosis not present

## 2017-05-04 DIAGNOSIS — M1990 Unspecified osteoarthritis, unspecified site: Secondary | ICD-10-CM | POA: Diagnosis not present

## 2017-05-04 DIAGNOSIS — M4800 Spinal stenosis, site unspecified: Secondary | ICD-10-CM | POA: Diagnosis not present

## 2017-05-07 DIAGNOSIS — M1990 Unspecified osteoarthritis, unspecified site: Secondary | ICD-10-CM | POA: Diagnosis not present

## 2017-05-07 DIAGNOSIS — N183 Chronic kidney disease, stage 3 (moderate): Secondary | ICD-10-CM | POA: Diagnosis not present

## 2017-05-07 DIAGNOSIS — I951 Orthostatic hypotension: Secondary | ICD-10-CM | POA: Diagnosis not present

## 2017-05-07 DIAGNOSIS — I129 Hypertensive chronic kidney disease with stage 1 through stage 4 chronic kidney disease, or unspecified chronic kidney disease: Secondary | ICD-10-CM | POA: Diagnosis not present

## 2017-05-07 DIAGNOSIS — N390 Urinary tract infection, site not specified: Secondary | ICD-10-CM | POA: Diagnosis not present

## 2017-05-07 DIAGNOSIS — M4800 Spinal stenosis, site unspecified: Secondary | ICD-10-CM | POA: Diagnosis not present

## 2017-05-11 DIAGNOSIS — I951 Orthostatic hypotension: Secondary | ICD-10-CM | POA: Diagnosis not present

## 2017-05-11 DIAGNOSIS — N183 Chronic kidney disease, stage 3 (moderate): Secondary | ICD-10-CM | POA: Diagnosis not present

## 2017-05-11 DIAGNOSIS — N390 Urinary tract infection, site not specified: Secondary | ICD-10-CM | POA: Diagnosis not present

## 2017-05-11 DIAGNOSIS — M1990 Unspecified osteoarthritis, unspecified site: Secondary | ICD-10-CM | POA: Diagnosis not present

## 2017-05-11 DIAGNOSIS — I129 Hypertensive chronic kidney disease with stage 1 through stage 4 chronic kidney disease, or unspecified chronic kidney disease: Secondary | ICD-10-CM | POA: Diagnosis not present

## 2017-05-11 DIAGNOSIS — M4800 Spinal stenosis, site unspecified: Secondary | ICD-10-CM | POA: Diagnosis not present

## 2017-05-14 DIAGNOSIS — I951 Orthostatic hypotension: Secondary | ICD-10-CM | POA: Diagnosis not present

## 2017-05-14 DIAGNOSIS — I129 Hypertensive chronic kidney disease with stage 1 through stage 4 chronic kidney disease, or unspecified chronic kidney disease: Secondary | ICD-10-CM | POA: Diagnosis not present

## 2017-05-14 DIAGNOSIS — M1990 Unspecified osteoarthritis, unspecified site: Secondary | ICD-10-CM | POA: Diagnosis not present

## 2017-05-14 DIAGNOSIS — M4800 Spinal stenosis, site unspecified: Secondary | ICD-10-CM | POA: Diagnosis not present

## 2017-05-14 DIAGNOSIS — N390 Urinary tract infection, site not specified: Secondary | ICD-10-CM | POA: Diagnosis not present

## 2017-05-14 DIAGNOSIS — N183 Chronic kidney disease, stage 3 (moderate): Secondary | ICD-10-CM | POA: Diagnosis not present

## 2017-05-18 DIAGNOSIS — M4800 Spinal stenosis, site unspecified: Secondary | ICD-10-CM | POA: Diagnosis not present

## 2017-05-18 DIAGNOSIS — N390 Urinary tract infection, site not specified: Secondary | ICD-10-CM | POA: Diagnosis not present

## 2017-05-18 DIAGNOSIS — N183 Chronic kidney disease, stage 3 (moderate): Secondary | ICD-10-CM | POA: Diagnosis not present

## 2017-05-18 DIAGNOSIS — I129 Hypertensive chronic kidney disease with stage 1 through stage 4 chronic kidney disease, or unspecified chronic kidney disease: Secondary | ICD-10-CM | POA: Diagnosis not present

## 2017-05-18 DIAGNOSIS — M1990 Unspecified osteoarthritis, unspecified site: Secondary | ICD-10-CM | POA: Diagnosis not present

## 2017-05-18 DIAGNOSIS — I951 Orthostatic hypotension: Secondary | ICD-10-CM | POA: Diagnosis not present

## 2017-05-21 DIAGNOSIS — M4800 Spinal stenosis, site unspecified: Secondary | ICD-10-CM | POA: Diagnosis not present

## 2017-05-21 DIAGNOSIS — I129 Hypertensive chronic kidney disease with stage 1 through stage 4 chronic kidney disease, or unspecified chronic kidney disease: Secondary | ICD-10-CM | POA: Diagnosis not present

## 2017-05-21 DIAGNOSIS — N183 Chronic kidney disease, stage 3 (moderate): Secondary | ICD-10-CM | POA: Diagnosis not present

## 2017-05-21 DIAGNOSIS — N390 Urinary tract infection, site not specified: Secondary | ICD-10-CM | POA: Diagnosis not present

## 2017-05-21 DIAGNOSIS — I951 Orthostatic hypotension: Secondary | ICD-10-CM | POA: Diagnosis not present

## 2017-05-21 DIAGNOSIS — M1990 Unspecified osteoarthritis, unspecified site: Secondary | ICD-10-CM | POA: Diagnosis not present

## 2017-05-22 DIAGNOSIS — Z6833 Body mass index (BMI) 33.0-33.9, adult: Secondary | ICD-10-CM | POA: Diagnosis not present

## 2017-05-22 DIAGNOSIS — G629 Polyneuropathy, unspecified: Secondary | ICD-10-CM | POA: Diagnosis not present

## 2017-05-22 DIAGNOSIS — R2689 Other abnormalities of gait and mobility: Secondary | ICD-10-CM | POA: Diagnosis not present

## 2017-05-22 DIAGNOSIS — I10 Essential (primary) hypertension: Secondary | ICD-10-CM | POA: Diagnosis not present

## 2017-05-22 DIAGNOSIS — R0609 Other forms of dyspnea: Secondary | ICD-10-CM | POA: Diagnosis not present

## 2017-05-22 DIAGNOSIS — M1990 Unspecified osteoarthritis, unspecified site: Secondary | ICD-10-CM | POA: Diagnosis not present

## 2017-05-26 DIAGNOSIS — Z7982 Long term (current) use of aspirin: Secondary | ICD-10-CM | POA: Diagnosis not present

## 2017-05-26 DIAGNOSIS — F329 Major depressive disorder, single episode, unspecified: Secondary | ICD-10-CM | POA: Diagnosis not present

## 2017-05-26 DIAGNOSIS — N183 Chronic kidney disease, stage 3 (moderate): Secondary | ICD-10-CM | POA: Diagnosis not present

## 2017-05-26 DIAGNOSIS — M1990 Unspecified osteoarthritis, unspecified site: Secondary | ICD-10-CM | POA: Diagnosis not present

## 2017-05-26 DIAGNOSIS — Z87440 Personal history of urinary (tract) infections: Secondary | ICD-10-CM | POA: Diagnosis not present

## 2017-05-26 DIAGNOSIS — I951 Orthostatic hypotension: Secondary | ICD-10-CM | POA: Diagnosis not present

## 2017-05-26 DIAGNOSIS — I129 Hypertensive chronic kidney disease with stage 1 through stage 4 chronic kidney disease, or unspecified chronic kidney disease: Secondary | ICD-10-CM | POA: Diagnosis not present

## 2017-05-26 DIAGNOSIS — F418 Other specified anxiety disorders: Secondary | ICD-10-CM | POA: Diagnosis not present

## 2017-05-26 DIAGNOSIS — F419 Anxiety disorder, unspecified: Secondary | ICD-10-CM | POA: Diagnosis not present

## 2017-06-08 DIAGNOSIS — I129 Hypertensive chronic kidney disease with stage 1 through stage 4 chronic kidney disease, or unspecified chronic kidney disease: Secondary | ICD-10-CM | POA: Diagnosis not present

## 2017-06-08 DIAGNOSIS — N183 Chronic kidney disease, stage 3 (moderate): Secondary | ICD-10-CM | POA: Diagnosis not present

## 2017-06-08 DIAGNOSIS — F419 Anxiety disorder, unspecified: Secondary | ICD-10-CM | POA: Diagnosis not present

## 2017-06-08 DIAGNOSIS — M1990 Unspecified osteoarthritis, unspecified site: Secondary | ICD-10-CM | POA: Diagnosis not present

## 2017-06-08 DIAGNOSIS — I951 Orthostatic hypotension: Secondary | ICD-10-CM | POA: Diagnosis not present

## 2017-06-08 DIAGNOSIS — F329 Major depressive disorder, single episode, unspecified: Secondary | ICD-10-CM | POA: Diagnosis not present

## 2017-06-14 DIAGNOSIS — N183 Chronic kidney disease, stage 3 (moderate): Secondary | ICD-10-CM | POA: Diagnosis not present

## 2017-06-14 DIAGNOSIS — I129 Hypertensive chronic kidney disease with stage 1 through stage 4 chronic kidney disease, or unspecified chronic kidney disease: Secondary | ICD-10-CM | POA: Diagnosis not present

## 2017-06-14 DIAGNOSIS — F419 Anxiety disorder, unspecified: Secondary | ICD-10-CM | POA: Diagnosis not present

## 2017-06-14 DIAGNOSIS — M1990 Unspecified osteoarthritis, unspecified site: Secondary | ICD-10-CM | POA: Diagnosis not present

## 2017-06-14 DIAGNOSIS — F329 Major depressive disorder, single episode, unspecified: Secondary | ICD-10-CM | POA: Diagnosis not present

## 2017-06-14 DIAGNOSIS — I951 Orthostatic hypotension: Secondary | ICD-10-CM | POA: Diagnosis not present

## 2017-06-18 DIAGNOSIS — I951 Orthostatic hypotension: Secondary | ICD-10-CM | POA: Diagnosis not present

## 2017-06-18 DIAGNOSIS — M1990 Unspecified osteoarthritis, unspecified site: Secondary | ICD-10-CM | POA: Diagnosis not present

## 2017-06-18 DIAGNOSIS — F329 Major depressive disorder, single episode, unspecified: Secondary | ICD-10-CM | POA: Diagnosis not present

## 2017-06-18 DIAGNOSIS — N183 Chronic kidney disease, stage 3 (moderate): Secondary | ICD-10-CM | POA: Diagnosis not present

## 2017-06-18 DIAGNOSIS — I129 Hypertensive chronic kidney disease with stage 1 through stage 4 chronic kidney disease, or unspecified chronic kidney disease: Secondary | ICD-10-CM | POA: Diagnosis not present

## 2017-06-18 DIAGNOSIS — F419 Anxiety disorder, unspecified: Secondary | ICD-10-CM | POA: Diagnosis not present

## 2017-06-25 DIAGNOSIS — F329 Major depressive disorder, single episode, unspecified: Secondary | ICD-10-CM | POA: Diagnosis not present

## 2017-06-25 DIAGNOSIS — N183 Chronic kidney disease, stage 3 (moderate): Secondary | ICD-10-CM | POA: Diagnosis not present

## 2017-06-25 DIAGNOSIS — M1990 Unspecified osteoarthritis, unspecified site: Secondary | ICD-10-CM | POA: Diagnosis not present

## 2017-06-25 DIAGNOSIS — I129 Hypertensive chronic kidney disease with stage 1 through stage 4 chronic kidney disease, or unspecified chronic kidney disease: Secondary | ICD-10-CM | POA: Diagnosis not present

## 2017-06-25 DIAGNOSIS — F419 Anxiety disorder, unspecified: Secondary | ICD-10-CM | POA: Diagnosis not present

## 2017-06-25 DIAGNOSIS — I951 Orthostatic hypotension: Secondary | ICD-10-CM | POA: Diagnosis not present

## 2017-07-02 DIAGNOSIS — I129 Hypertensive chronic kidney disease with stage 1 through stage 4 chronic kidney disease, or unspecified chronic kidney disease: Secondary | ICD-10-CM | POA: Diagnosis not present

## 2017-07-02 DIAGNOSIS — F419 Anxiety disorder, unspecified: Secondary | ICD-10-CM | POA: Diagnosis not present

## 2017-07-02 DIAGNOSIS — I951 Orthostatic hypotension: Secondary | ICD-10-CM | POA: Diagnosis not present

## 2017-07-02 DIAGNOSIS — M1990 Unspecified osteoarthritis, unspecified site: Secondary | ICD-10-CM | POA: Diagnosis not present

## 2017-07-02 DIAGNOSIS — F329 Major depressive disorder, single episode, unspecified: Secondary | ICD-10-CM | POA: Diagnosis not present

## 2017-07-02 DIAGNOSIS — N183 Chronic kidney disease, stage 3 (moderate): Secondary | ICD-10-CM | POA: Diagnosis not present

## 2017-07-03 ENCOUNTER — Ambulatory Visit (INDEPENDENT_AMBULATORY_CARE_PROVIDER_SITE_OTHER): Payer: Medicare Other | Admitting: Family Medicine

## 2017-07-03 ENCOUNTER — Other Ambulatory Visit: Payer: Self-pay

## 2017-07-03 ENCOUNTER — Encounter: Payer: Self-pay | Admitting: Family Medicine

## 2017-07-03 VITALS — BP 140/88 | HR 95 | Resp 18 | Ht 65.0 in | Wt 203.0 lb

## 2017-07-03 DIAGNOSIS — H6123 Impacted cerumen, bilateral: Secondary | ICD-10-CM

## 2017-07-03 DIAGNOSIS — H9193 Unspecified hearing loss, bilateral: Secondary | ICD-10-CM

## 2017-07-03 NOTE — Progress Notes (Signed)
Subjective:  By signing my name below, I, Steven Cain, attest that this documentation has been prepared under the direction and in the presence of Steven Agreste, MD Electronically Signed: Ladene Cain, ED Scribe 07/03/2017 at 3:52 PM.   Patient ID: Steven Cain, male    DOB: 04-15-1931, 83 y.o.   MRN: [id number]  Chief Complaint  Patient presents with  . Cerumen Impaction    bilateral. Patient states that he is having difficulty hearing   HPI Steven Cain is a 83 y.o. male who presents to Primary Care at [facility] complaining of decreased hearing for a while. Pt went to have hearing aids fitting but his ears were too impacted to do tympanic testing. Denies ear pain.  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  Patient Active Problem List   Diagnosis Date Noted  . Syncope due to orthostatic hypotension 03/23/2017  . Hyponatremia 03/22/2017  . Essential hypertension 03/22/2017  . Osteoarthritis of left hip 05/19/2016  . Status post left hip replacement 05/19/2016  . CKD (chronic kidney disease) stage 3, GFR 30-59 ml/min (HCC) 09/27/2014  . Anemia associated with acute blood loss 09/27/2014  . Orthostasis 09/27/2014  . GI bleed 09/27/2014  . Acute pyelonephritis 05/08/2014    Class: Acute  . Hydronephrosis of left kidney 05/08/2014    Class: Acute  . Ureteral mass 05/08/2014    Class: Acute  . UTI (urinary tract infection) 04/24/2013  . Chills 04/24/2013   Past Medical History:  Diagnosis Date  . Anxiety   . Arthritis    DJD  . Benign enlargement of prostate    frequent UTI, trim of prostate done , with some issues of incontinence now.  . Bronchitis    chronic- usually has phelgm often  . Depression   . Dyspnea    with excertion  . Fluid retention in legs    occasional use of diuretic as needed  . GI bleed   . Hard of hearing    hear more on left side- no hearing aid  . History of transfusion   . Hypertension   . Perforated, severe  stomach ulcer ([place])   . Spinal stenosis   . Transfusion history    none recent  . Ureteral stricture    CHRONIC  . UTI (urinary tract infection)    frequent   Past Surgical History:  Procedure Laterality Date  . ABDOMINAL SURGERY     bilroths tube '74  . COLONOSCOPY N/A 09/29/2014   Procedure: COLONOSCOPY;  Surgeon: Beryle Beams, MD;  Location: [facility];  Service: Endoscopy;  Laterality: N/A;  . CYSTOSCOPY W/ URETERAL STENT PLACEMENT Left 05/06/2014   Procedure: CYSTOSCOPY WITH RETROGRADE PYELOGRAM/URETERAL STENT PLACEMENT;  Surgeon: [name], MD;  Location: [facility];  Service: Urology;  Laterality: Left;  . CYSTOSCOPY W/ URETERAL STENT PLACEMENT Left 03/31/2015   Procedure: CYSTOSCOPY WITH LEFT RETROGRADE PYELOGRAM/URETERAL STENT EXCHANGE;  Surgeon: [name], MD;  Location: [facility];  Service: Urology;  Laterality: Left;  . CYSTOSCOPY W/ URETERAL STENT PLACEMENT Left 09/15/2015   Procedure: CYSTOSCOPY WITH LEFT RETROGRADE PYELOGRAM/URETERAL STENT EXCHANGE ;  Surgeon: [name], MD;  Location: [facility];  Service: Urology;  Laterality: Left;  . CYSTOSCOPY W/ URETERAL STENT PLACEMENT Left 04/28/2016   Procedure: CYSTOSCOPY WITH RETROGRADE PYELOGRAM/URETERAL STENT EXCHANGE;  Surgeon: [name], MD;  Location: [facility];  Service: Urology;  Laterality: Left;  . CYSTOSCOPY W/ URETERAL STENT PLACEMENT Left 11/08/2016   Procedure: CYSTOSCOPY WITH  RETROGRADE PYELOGRAM/URETERAL STENT EXCHANGE;  Surgeon: [name], MD;  Location: [facility];  Service: Urology;  Laterality: Left;  . CYSTOSCOPY WITH RETROGRADE PYELOGRAM, URETEROSCOPY AND STENT PLACEMENT Left 10/21/2014   Procedure: CYSTOSCOPY WITH RETROGRADE PYELOGRAM, URETEROSCOPY,BIOPSY AND STENT CHANGE;  Surgeon: [name], MD;  Location: [facility];  Service: Urology;  Laterality: Left;  . CYSTOSCOPY/RETROGRADE/URETEROSCOPY Left 06/03/2014   Procedure: CYSTOSCOPY/RETROGRADE/URETEROSCOPY WITH BIOPSY,STENT EXCHANGE;  Surgeon: [name], MD;  Location: [facility];  Service: Urology;  Laterality: Left;  . ESOPHAGOGASTRODUODENOSCOPY N/A 09/28/2014   Procedure: ESOPHAGOGASTRODUODENOSCOPY (EGD);  Surgeon: Beryle Beams, MD;  Location: [facility];  Service: Endoscopy;  Laterality: N/A;  . GASTRIC ROUX-EN-Y     '74  . HERNIA REPAIR     bilateral hernia repairs   . TONSILLECTOMY    . TOTAL HIP ARTHROPLASTY Left 05/19/2016   Procedure: LEFT TOTAL HIP ARTHROPLASTY ANTERIOR APPROACH;  Surgeon: Mcarthur Rossetti, MD;  Location: [facility];  Service: Orthopedics;  Laterality: Left;  . TRANSURETHRAL RESECTION OF PROSTATE     No Known Allergies Prior to Admission medications   Medication Sig Start Date End Date Taking? Authorizing Provider  amLODipine (NORVASC) 5 MG tablet Take 5 mg by mouth at bedtime. 02/17/17   [provider]  Ascorbic Acid (VITAMIN C) 1000 MG tablet Take 1,000 mg by mouth every morning.     [provider]  aspirin EC 325 MG EC tablet Take 1 tablet (325 mg total) by mouth 2 (two) times daily after a meal. Patient taking differently: Take 325 mg by mouth daily.  05/21/16   Mcarthur Rossetti, MD  b complex vitamins tablet Take 1 tablet by mouth daily.    [provider]  cyanocobalamin (,VITAMIN B-12,) 1000 MCG/ML injection Inject 1,000 mcg into the muscle every 30 (thirty) days.    [provider]  fish oil-omega-3 fatty acids 1000 MG capsule Take 2 g by mouth every morning.     [provider]  meclizine (ANTIVERT) 25 MG tablet Take 25 mg by mouth 2 (two) times daily as needed for dizziness.    [provider]  Multiple Vitamin (MULTIVITAMIN WITH MINERALS) TABS tablet Take 1 tablet by mouth every morning.    [provider]  PARoxetine (PAXIL) 20 MG tablet Take 40 mg by mouth 3 (three) times daily. If sleepy only take 1 tablet    [provider]  psyllium (HYDROCIL/METAMUCIL) 95 % PACK Take 1 packet by mouth daily.     [provider]  testosterone cypionate (DEPOTESTOTERONE CYPIONATE) 200 MG/ML injection Inject 60 mg into the muscle every 6 (six) weeks. 3/10 of cc.    [provider]  vitamin E 100 UNIT capsule Take 100 Units by mouth every morning.     [provider]   Social History   Socioeconomic History  . Marital status: Married    Spouse name: Not on file  . Number of children: Not on file  . Years of education: Not on file  . Highest education level: Not on file  Social Needs  . Financial resource strain: Not on file  . Food insecurity - worry: Not on file  . Food insecurity - inability: Not on file  . Transportation needs - medical: Not on file  . Transportation needs - non-medical: Not on file  Occupational History  . Not on file  Tobacco Use  . Smoking status: Never Smoker  . Smokeless tobacco: Former Systems developer    Types: Loss adjuster, chartered  Substance and Sexual Activity  . Alcohol use: No  . Drug use: No  . Sexual activity: Not Currently  Other Topics Concern  . Not on file  Social History Narrative  . Not on file   Review of Systems  HENT: Positive for hearing loss. Negative for ear pain.       Objective:   Physical Exam  Constitutional: He is oriented to person, place, and time. He appears well-developed and well-nourished. No distress.  HENT:  Head: Normocephalic and atraumatic.  Thick yellow cerumen bilateral canals. External pinna nontender. Unable to hear finger rub out of either ear. L TM: thick cerumen that appears to be 80-90% occluding the canal After cerumen lavage bilateral, canals clear. TMs pearly grey. Improved hearing bilateral. No complications.  Eyes: Conjunctivae and EOM are normal.  Neck: Neck supple. No tracheal deviation present.  Cardiovascular: Normal rate.  Pulmonary/Chest: Effort normal. No respiratory distress.  Musculoskeletal: Normal range of motion.  Neurological: He is alert and oriented to person, place, and time.  Skin: Skin is warm and  dry.  Psychiatric: He has a normal mood and affect. His behavior is normal.  Nursing note and vitals reviewed.  Vitals:   07/03/17 1535  BP: 140/88  Pulse: 95  Resp: 18  SpO2: 94%  Weight: 203 lb (92.1 kg)  Height: 5\' 5"  (1.651 m)      Assessment & Plan:   RUDI BUNYARD is a 83 y.o. male Bilateral impacted cerumen - Plan: Ear wax removal  Hearing difficulty of both ears  Bilateral cerumen excess with impaction on right greater than left. Baseline hearing difficulty worsened with cerumen impaction.   -Lavage performed bilaterally with improvement of symptoms, hearing also improved, no complications. RTC precautions given.   No orders of the defined types were placed in this encounter.  Patient Instructions    Ears look better. If any pain, or discharge - return for recheck. Thanks for coming in today.    Earwax Buildup, Adult The ears produce a substance called earwax that helps keep bacteria out of the ear and protects the skin in the ear canal. Occasionally, earwax can build up in the ear and cause discomfort or hearing loss. What increases the risk? This condition is more likely to develop in people who:  Are male.  Are elderly.  Naturally produce more earwax.  Clean their ears often with cotton swabs.  Use earplugs often.  Use in-ear headphones often.  Wear hearing aids.  Have narrow ear canals.  Have earwax that is overly thick or sticky.  Have eczema.  Are dehydrated.  Have excess hair in the ear canal.  What are the signs or symptoms? Symptoms of this condition include:  Reduced or muffled hearing.  A feeling of fullness in the ear or feeling that the ear is plugged.  Fluid coming from the ear.  Ear pain.  Ear itch.  Ringing in the ear.  Coughing.  An obvious piece of earwax that can be seen inside the ear canal.  How is this diagnosed? This condition may be diagnosed based on:  Your symptoms.  Your medical history.  An ear  exam. During the exam, your health care provider will look into your ear with an instrument called an otoscope.  You may have tests, including a hearing test. How is this treated? This condition may be treated by:  Using ear drops to soften the earwax.  Having the earwax removed by a health care provider. The health care  provider may: ? Flush the ear with water. ? Use an instrument that has a loop on the end (curette). ? Use a suction device.  Surgery to remove the wax buildup. This may be done in severe cases.  Follow these instructions at home:  Take over-the-counter and prescription medicines only as told by your health care provider.  Do not put any objects, including cotton swabs, into your ear. You can clean the opening of your ear canal with a washcloth or facial tissue.  Follow instructions from your health care provider about cleaning your ears. Do not over-clean your ears.  Drink enough fluid to keep your urine clear or pale yellow. This will help to thin the earwax.  Keep all follow-up visits as told by your health care provider. If earwax builds up in your ears often or if you use hearing aids, consider seeing your health care provider for routine, preventive ear cleanings. Ask your health care provider how often you should schedule your cleanings.  If you have hearing aids, clean them according to instructions from the manufacturer and your health care provider. Contact a health care provider if:  You have ear pain.  You develop a fever.  You have blood, pus, or other fluid coming from your ear.  You have hearing loss.  You have ringing in your ears that does not go away.  Your symptoms do not improve with treatment.  You feel like the room is spinning (vertigo). Summary  Earwax can build up in the ear and cause discomfort or hearing loss.  The most common symptoms of this condition include reduced or muffled hearing and a feeling of fullness in the ear or  feeling that the ear is plugged.  This condition may be diagnosed based on your symptoms, your medical history, and an ear exam.  This condition may be treated by using ear drops to soften the earwax or by having the earwax removed by a health care provider.  Do not put any objects, including cotton swabs, into your ear. You can clean the opening of your ear canal with a washcloth or facial tissue. This information is not intended to replace advice given to you by your health care provider. Make sure you discuss any questions you have with your health care provider. Document Released: 09/07/2004 Document Revised: 10/11/2016 Document Reviewed: 10/11/2016 Elsevier Interactive Patient Education  2018 [employer].    IF you received an x-ray today, you will receive an invoice from [facility]. Please contact [facility] at [phone] with questions or concerns regarding your invoice.   IF you received labwork today, you will receive an invoice from [facility]. Please contact [facility] at [phone] with questions or concerns regarding your invoice.   Our billing staff will not be able to assist you with questions regarding bills from these companies.  You will be contacted with the lab results as soon as they are available. The fastest way to get your results is to activate your My Chart account. Instructions are located on the last page of this paperwork. If you have not heard from [place] regarding the results in 2 weeks, please contact this office.      I personally performed the services described in this documentation, which was scribed in my presence. The recorded information has been reviewed and considered for accuracy and completeness, addended by me as needed, and agree with information above.  Signed,   Merri Ray, MD Primary [facility] at [place].  07/05/17 5:09 PM

## 2017-07-03 NOTE — Patient Instructions (Addendum)
Ears look better. If any pain, or discharge - return for recheck. Thanks for coming in today.    Earwax Buildup, Adult The ears produce a substance called earwax that helps keep bacteria out of the ear and protects the skin in the ear canal. Occasionally, earwax can build up in the ear and cause discomfort or hearing loss. What increases the risk? This condition is more likely to develop in people who:  Are male.  Are elderly.  Naturally produce more earwax.  Clean their ears often with cotton swabs.  Use earplugs often.  Use in-ear headphones often.  Wear hearing aids.  Have narrow ear canals.  Have earwax that is overly thick or sticky.  Have eczema.  Are dehydrated.  Have excess hair in the ear canal.  What are the signs or symptoms? Symptoms of this condition include:  Reduced or muffled hearing.  A feeling of fullness in the ear or feeling that the ear is plugged.  Fluid coming from the ear.  Ear pain.  Ear itch.  Ringing in the ear.  Coughing.  An obvious piece of earwax that can be seen inside the ear canal.  How is this diagnosed? This condition may be diagnosed based on:  Your symptoms.  Your medical history.  An ear exam. During the exam, your health care provider will look into your ear with an instrument called an otoscope.  You may have tests, including a hearing test. How is this treated? This condition may be treated by:  Using ear drops to soften the earwax.  Having the earwax removed by a health care provider. The health care provider may: ? Flush the ear with water. ? Use an instrument that has a loop on the end (curette). ? Use a suction device.  Surgery to remove the wax buildup. This may be done in severe cases.  Follow these instructions at home:  Take over-the-counter and prescription medicines only as told by your health care provider.  Do not put any objects, including cotton swabs, into your ear. You can clean  the opening of your ear canal with a washcloth or facial tissue.  Follow instructions from your health care provider about cleaning your ears. Do not over-clean your ears.  Drink enough fluid to keep your urine clear or pale yellow. This will help to thin the earwax.  Keep all follow-up visits as told by your health care provider. If earwax builds up in your ears often or if you use hearing aids, consider seeing your health care provider for routine, preventive ear cleanings. Ask your health care provider how often you should schedule your cleanings.  If you have hearing aids, clean them according to instructions from the manufacturer and your health care provider. Contact a health care provider if:  You have ear pain.  You develop a fever.  You have blood, pus, or other fluid coming from your ear.  You have hearing loss.  You have ringing in your ears that does not go away.  Your symptoms do not improve with treatment.  You feel like the room is spinning (vertigo). Summary  Earwax can build up in the ear and cause discomfort or hearing loss.  The most common symptoms of this condition include reduced or muffled hearing and a feeling of fullness in the ear or feeling that the ear is plugged.  This condition may be diagnosed based on your symptoms, your medical history, and an ear exam.  This condition may be treated by  using ear drops to soften the earwax or by having the earwax removed by a health care provider.  Do not put any objects, including cotton swabs, into your ear. You can clean the opening of your ear canal with a washcloth or facial tissue. This information is not intended to replace advice given to you by your health care provider. Make sure you discuss any questions you have with your health care provider. Document Released: 09/07/2004 Document Revised: 10/11/2016 Document Reviewed: 10/11/2016 Elsevier Interactive Patient Education  2018 [employer].    IF  you received an x-ray today, you will receive an invoice from [facility]. Please contact [facility] at [phone] with questions or concerns regarding your invoice.   IF you received labwork today, you will receive an invoice from [place]. Please contact [facility] at [phone] with questions or concerns regarding your invoice.   Our billing staff will not be able to assist you with questions regarding bills from these companies.  You will be contacted with the lab results as soon as they are available. The fastest way to get your results is to activate your My Chart account. Instructions are located on the last page of this paperwork. If you have not heard from [place] regarding the results in 2 weeks, please contact this office.

## 2017-07-05 ENCOUNTER — Encounter: Payer: Self-pay | Admitting: Family Medicine

## 2017-07-31 DIAGNOSIS — E538 Deficiency of other specified B group vitamins: Secondary | ICD-10-CM | POA: Diagnosis not present

## 2017-08-21 DIAGNOSIS — N3941 Urge incontinence: Secondary | ICD-10-CM | POA: Diagnosis not present

## 2017-08-21 DIAGNOSIS — N135 Crossing vessel and stricture of ureter without hydronephrosis: Secondary | ICD-10-CM | POA: Diagnosis not present

## 2017-08-24 ENCOUNTER — Other Ambulatory Visit: Payer: Self-pay | Admitting: Urology

## 2017-09-10 NOTE — Progress Notes (Signed)
03-22-17 (Epic) EKG, ECHO

## 2017-09-10 NOTE — Patient Instructions (Addendum)
Steven Cain  09/10/2017   Your procedure is scheduled on: 09-14-17   Report to [facility] Main  Entrance Report to Admitting at 8:00 AM   Call this number if you have problems the morning of surgery [phone]   Remember: Do not eat food or drink liquids :After Midnight.     Take these medicines the morning of surgery with A SIP OF WATER: Paroxetine (Paxel), and Amlodipine (Norvasc)                                 You may not have any metal on your body including hair pins and              piercings  Do not wear jewelry, lotions, powders or deodorant             Men may shave face and neck.   Do not bring valuables to the hospital. [place].  Contacts, dentures or bridgework may not be worn into surgery.      Patients discharged the day of surgery will not be allowed to drive home.  Name and phone number of your driver: Steven Cain [phone]                Please read over the following fact sheets you were given: _____________________________________________________________________             [facility] - Preparing for Surgery Before surgery, you can play an important role.  Because skin is not sterile, your skin needs to be as free of germs as possible.  You can reduce the number of germs on your skin by washing with CHG (chlorahexidine gluconate) soap before surgery.  CHG is an antiseptic cleaner which kills germs and bonds with the skin to continue killing germs even after washing. Please DO NOT use if you have an allergy to CHG or antibacterial soaps.  If your skin becomes reddened/irritated stop using the CHG and inform your nurse when you arrive at Short Stay. Do not shave (including legs and underarms) for at least 48 hours prior to the first CHG shower.  You may shave your face/neck. Please follow these instructions carefully:  1.  Shower with CHG Soap the night before surgery and the   morning of Surgery.  2.  If you choose to wash your hair, wash your hair first as usual with your  normal  shampoo.  3.  After you shampoo, rinse your hair and body thoroughly to remove the  shampoo.                           4.  Use CHG as you would any other liquid soap.  You can apply chg directly  to the skin and wash                       Gently with a scrungie or clean washcloth.  5.  Apply the CHG Soap to your body ONLY FROM THE NECK DOWN.   Do not use on face/ open  Wound or open sores. Avoid contact with eyes, ears mouth and genitals (private parts).                       Wash face,  Genitals (private parts) with your normal soap.             6.  Wash thoroughly, paying special attention to the area where your surgery  will be performed.  7.  Thoroughly rinse your body with warm water from the neck down.  8.  DO NOT shower/wash with your normal soap after using and rinsing off  the CHG Soap.                9.  Pat yourself dry with a clean towel.            10.  Wear clean pajamas.            11.  Place clean sheets on your bed the night of your first shower and do not  sleep with pets. Day of Surgery : Do not apply any lotions/deodorants the morning of surgery.  Please wear clean clothes to the hospital/surgery center.  FAILURE TO FOLLOW THESE INSTRUCTIONS MAY RESULT IN THE CANCELLATION OF YOUR SURGERY PATIENT SIGNATURE_________________________________  NURSE SIGNATURE__________________________________  ________________________________________________________________________

## 2017-09-11 ENCOUNTER — Other Ambulatory Visit: Payer: Self-pay

## 2017-09-11 ENCOUNTER — Encounter (HOSPITAL_COMMUNITY): Payer: Self-pay

## 2017-09-11 ENCOUNTER — Encounter (HOSPITAL_COMMUNITY)
Admission: RE | Admit: 2017-09-11 | Discharge: 2017-09-11 | Disposition: A | Discharge status: Home / Self Care | Payer: Medicare Other | Source: Ambulatory Visit | Attending: Urology | Admitting: Urology

## 2017-09-11 DIAGNOSIS — Z79899 Other long term (current) drug therapy: Secondary | ICD-10-CM | POA: Diagnosis not present

## 2017-09-11 DIAGNOSIS — Z9079 Acquired absence of other genital organ(s): Secondary | ICD-10-CM | POA: Diagnosis not present

## 2017-09-11 DIAGNOSIS — I10 Essential (primary) hypertension: Secondary | ICD-10-CM | POA: Diagnosis not present

## 2017-09-11 DIAGNOSIS — F1020 Alcohol dependence, uncomplicated: Secondary | ICD-10-CM | POA: Diagnosis not present

## 2017-09-11 DIAGNOSIS — Z8719 Personal history of other diseases of the digestive system: Secondary | ICD-10-CM | POA: Diagnosis not present

## 2017-09-11 DIAGNOSIS — F17220 Nicotine dependence, chewing tobacco, uncomplicated: Secondary | ICD-10-CM | POA: Diagnosis not present

## 2017-09-11 DIAGNOSIS — F419 Anxiety disorder, unspecified: Secondary | ICD-10-CM | POA: Diagnosis not present

## 2017-09-11 DIAGNOSIS — Z87440 Personal history of urinary (tract) infections: Secondary | ICD-10-CM | POA: Diagnosis not present

## 2017-09-11 DIAGNOSIS — N131 Hydronephrosis with ureteral stricture, not elsewhere classified: Secondary | ICD-10-CM | POA: Diagnosis present

## 2017-09-11 DIAGNOSIS — N400 Enlarged prostate without lower urinary tract symptoms: Secondary | ICD-10-CM | POA: Diagnosis not present

## 2017-09-11 DIAGNOSIS — F329 Major depressive disorder, single episode, unspecified: Secondary | ICD-10-CM | POA: Diagnosis not present

## 2017-09-11 LAB — CBC
HCT: 37.7 % — ABNORMAL LOW (ref 39.0–52.0)
HEMOGLOBIN: 12.3 g/dL — AB (ref 13.0–17.0)
MCH: 28 pg (ref 26.0–34.0)
MCHC: 32.6 g/dL (ref 30.0–36.0)
MCV: 85.7 fL (ref 78.0–100.0)
PLATELETS: 288 10*3/uL (ref 150–400)
RBC: 4.4 MIL/uL (ref 4.22–5.81)
RDW: 15.3 % (ref 11.5–15.5)
WBC: 11.2 10*3/uL — AB (ref 4.0–10.5)

## 2017-09-11 LAB — BASIC METABOLIC PANEL
ANION GAP: 7 (ref 5–15)
BUN: 19 mg/dL (ref 6–20)
CHLORIDE: 98 mmol/L — AB (ref 101–111)
CO2: 26 mmol/L (ref 22–32)
Calcium: 8.9 mg/dL (ref 8.9–10.3)
Creatinine, Ser: 1.69 mg/dL — ABNORMAL HIGH (ref 0.61–1.24)
GFR, EST AFRICAN AMERICAN: 41 mL/min — AB (ref >60)
GFR, EST NON AFRICAN AMERICAN: 35 mL/min — AB (ref >60)
Glucose, Bld: 106 mg/dL — ABNORMAL HIGH (ref 65–99)
POTASSIUM: 4.5 mmol/L (ref 3.5–5.1)
SODIUM: 131 mmol/L — AB (ref 135–145)

## 2017-09-11 NOTE — Progress Notes (Signed)
09-11-17 BMP result routed to Dr. Tresa Moore for review.

## 2017-09-14 ENCOUNTER — Encounter (HOSPITAL_COMMUNITY)
Admission: RE | Disposition: A | Discharge status: Home / Self Care | Payer: Self-pay | Source: Ambulatory Visit | Attending: Urology

## 2017-09-14 ENCOUNTER — Encounter (HOSPITAL_COMMUNITY): Payer: Self-pay | Admitting: *Deleted

## 2017-09-14 ENCOUNTER — Ambulatory Visit (HOSPITAL_COMMUNITY): Payer: Medicare Other | Admitting: Anesthesiology

## 2017-09-14 ENCOUNTER — Ambulatory Visit (HOSPITAL_COMMUNITY)
Admission: RE | Admit: 2017-09-14 | Discharge: 2017-09-14 | Disposition: A | Discharge status: Home / Self Care | Payer: Medicare Other | Source: Ambulatory Visit | Attending: Urology | Admitting: Urology

## 2017-09-14 ENCOUNTER — Ambulatory Visit (HOSPITAL_COMMUNITY): Payer: Medicare Other

## 2017-09-14 DIAGNOSIS — F1020 Alcohol dependence, uncomplicated: Secondary | ICD-10-CM | POA: Insufficient documentation

## 2017-09-14 DIAGNOSIS — N400 Enlarged prostate without lower urinary tract symptoms: Secondary | ICD-10-CM | POA: Diagnosis not present

## 2017-09-14 DIAGNOSIS — Z87440 Personal history of urinary (tract) infections: Secondary | ICD-10-CM | POA: Insufficient documentation

## 2017-09-14 DIAGNOSIS — I10 Essential (primary) hypertension: Secondary | ICD-10-CM | POA: Diagnosis not present

## 2017-09-14 DIAGNOSIS — N131 Hydronephrosis with ureteral stricture, not elsewhere classified: Secondary | ICD-10-CM | POA: Insufficient documentation

## 2017-09-14 DIAGNOSIS — F17220 Nicotine dependence, chewing tobacco, uncomplicated: Secondary | ICD-10-CM | POA: Insufficient documentation

## 2017-09-14 DIAGNOSIS — F419 Anxiety disorder, unspecified: Secondary | ICD-10-CM | POA: Insufficient documentation

## 2017-09-14 DIAGNOSIS — F329 Major depressive disorder, single episode, unspecified: Secondary | ICD-10-CM | POA: Insufficient documentation

## 2017-09-14 DIAGNOSIS — N183 Chronic kidney disease, stage 3 (moderate): Secondary | ICD-10-CM | POA: Diagnosis not present

## 2017-09-14 DIAGNOSIS — K922 Gastrointestinal hemorrhage, unspecified: Secondary | ICD-10-CM | POA: Diagnosis not present

## 2017-09-14 DIAGNOSIS — N136 Pyonephrosis: Secondary | ICD-10-CM | POA: Diagnosis not present

## 2017-09-14 DIAGNOSIS — Z8719 Personal history of other diseases of the digestive system: Secondary | ICD-10-CM | POA: Insufficient documentation

## 2017-09-14 DIAGNOSIS — I129 Hypertensive chronic kidney disease with stage 1 through stage 4 chronic kidney disease, or unspecified chronic kidney disease: Secondary | ICD-10-CM | POA: Diagnosis not present

## 2017-09-14 DIAGNOSIS — Z9079 Acquired absence of other genital organ(s): Secondary | ICD-10-CM | POA: Insufficient documentation

## 2017-09-14 DIAGNOSIS — N135 Crossing vessel and stricture of ureter without hydronephrosis: Secondary | ICD-10-CM | POA: Diagnosis not present

## 2017-09-14 DIAGNOSIS — Z79899 Other long term (current) drug therapy: Secondary | ICD-10-CM | POA: Insufficient documentation

## 2017-09-14 HISTORY — PX: CYSTOSCOPY W/ URETERAL STENT PLACEMENT: SHX1429

## 2017-09-14 SURGERY — CYSTOSCOPY, WITH RETROGRADE PYELOGRAM AND URETERAL STENT INSERTION
Anesthesia: General | Laterality: Left

## 2017-09-14 MED ORDER — ONDANSETRON HCL 4 MG/2ML IJ SOLN
INTRAMUSCULAR | Status: AC
  Filled 2017-09-14: qty 2

## 2017-09-14 MED ORDER — PROMETHAZINE HCL 25 MG/ML IJ SOLN: 6.2500 mg | INTRAMUSCULAR | Status: DC | PRN

## 2017-09-14 MED ORDER — DEXAMETHASONE SODIUM PHOSPHATE 10 MG/ML IJ SOLN
INTRAMUSCULAR | Status: DC | PRN
  Administered 2017-09-14: 10 mg via INTRAVENOUS

## 2017-09-14 MED ORDER — DEXAMETHASONE SODIUM PHOSPHATE 10 MG/ML IJ SOLN
INTRAMUSCULAR | Status: AC
  Filled 2017-09-14: qty 1

## 2017-09-14 MED ORDER — 0.9 % SODIUM CHLORIDE (POUR BTL) OPTIME
TOPICAL | Status: DC | PRN
  Administered 2017-09-14: 1000 mL

## 2017-09-14 MED ORDER — FENTANYL CITRATE (PF) 100 MCG/2ML IJ SOLN
INTRAMUSCULAR | Status: DC | PRN
  Administered 2017-09-14: 50 ug via INTRAVENOUS

## 2017-09-14 MED ORDER — ONDANSETRON HCL 4 MG/2ML IJ SOLN
INTRAMUSCULAR | Status: DC | PRN
  Administered 2017-09-14: 4 mg via INTRAVENOUS

## 2017-09-14 MED ORDER — LACTATED RINGERS IV SOLN
INTRAVENOUS | Status: DC
  Administered 2017-09-14: 09:00:00 via INTRAVENOUS

## 2017-09-14 MED ORDER — LIDOCAINE 2% (20 MG/ML) 5 ML SYRINGE
INTRAMUSCULAR | Status: AC
  Filled 2017-09-14: qty 5

## 2017-09-14 MED ORDER — IOHEXOL 300 MG/ML  SOLN
INTRAMUSCULAR | Status: DC | PRN
  Administered 2017-09-14: 7 mL

## 2017-09-14 MED ORDER — FENTANYL CITRATE (PF) 100 MCG/2ML IJ SOLN: 25.0000 ug | INTRAMUSCULAR | Status: DC | PRN

## 2017-09-14 MED ORDER — DEXTROSE 5 % IV SOLN
2.0000 g | INTRAVENOUS | Status: AC
  Administered 2017-09-14: 2 g via INTRAVENOUS
  Filled 2017-09-14: qty 2

## 2017-09-14 MED ORDER — PROPOFOL 10 MG/ML IV BOLUS
INTRAVENOUS | Status: AC
  Filled 2017-09-14: qty 20

## 2017-09-14 MED ORDER — PROPOFOL 10 MG/ML IV BOLUS
INTRAVENOUS | Status: DC | PRN
  Administered 2017-09-14: 140 mg via INTRAVENOUS

## 2017-09-14 MED ORDER — LIDOCAINE 2% (20 MG/ML) 5 ML SYRINGE
INTRAMUSCULAR | Status: DC | PRN
  Administered 2017-09-14: 100 mg via INTRAVENOUS

## 2017-09-14 MED ORDER — STERILE WATER FOR IRRIGATION IR SOLN
Status: DC | PRN
  Administered 2017-09-14: 3000 mL

## 2017-09-14 MED ORDER — FENTANYL CITRATE (PF) 100 MCG/2ML IJ SOLN
INTRAMUSCULAR | Status: AC
  Filled 2017-09-14: qty 2

## 2017-09-14 SURGICAL SUPPLY — 16 items
BAG URO CATCHER STRL LF (MISCELLANEOUS) ×3 IMPLANT
BASKET ZERO TIP NITINOL 2.4FR (BASKET) IMPLANT
BSKT STON RTRVL ZERO TP 2.4FR (BASKET)
CATH INTERMIT  6FR 70CM (CATHETERS) IMPLANT
CLOTH BEACON ORANGE TIMEOUT ST (SAFETY) ×3 IMPLANT
COVER FOOTSWITCH UNIV (MISCELLANEOUS) IMPLANT
GLOVE BIOGEL M STRL SZ7.5 (GLOVE) ×3 IMPLANT
GOWN STRL REUS W/TWL LRG LVL3 (GOWN DISPOSABLE) ×6 IMPLANT
GUIDEWIRE ANG ZIPWIRE 038X150 (WIRE) ×3 IMPLANT
GUIDEWIRE STR DUAL SENSOR (WIRE) ×1 IMPLANT
MANIFOLD NEPTUNE II (INSTRUMENTS) ×3 IMPLANT
PACK CYSTO (CUSTOM PROCEDURE TRAY) ×3 IMPLANT
STENT CONTOUR 7X26 (STENTS) ×2 IMPLANT
TUBING CONNECTING 10 (TUBING) ×2 IMPLANT
TUBING CONNECTING 10' (TUBING) ×1
TUBING UROLOGY SET (TUBING) ×2 IMPLANT

## 2017-09-14 NOTE — Discharge Instructions (Signed)
°  Post Anesthesia Home Care Instructions  Activity: Get plenty of rest for the remainder of the day. A responsible individual must stay with you for 24 hours following the procedure.  For the next 24 hours, DO NOT: -Drive a car -Paediatric nurse -Drink alcoholic beverages -Take any medication unless instructed by your physician -Make any legal decisions or sign important papers.  Meals: Start with liquid foods such as gelatin or soup. Progress to regular foods as tolerated. Avoid greasy, spicy, heavy foods. If nausea and/or vomiting occur, drink only clear liquids until the nausea and/or vomiting subsides. Call your physician if vomiting continues.  Special Instructions/Symptoms: Your throat may feel dry or sore from the anesthesia or the breathing tube placed in your throat during surgery. If this causes discomfort, gargle with warm salt water. The discomfort should disappear within 24 hours.    1 - You may have urinary urgency (bladder spasms) and bloody urine on / off with stent in place. This is normal.  2 - Call MD or go to ER for fever >102, severe pain / nausea / vomiting not relieved by medications, or acute change in medical status

## 2017-09-14 NOTE — Anesthesia Preprocedure Evaluation (Signed)
Anesthesia Evaluation  Patient identified by MRN, date of birth, ID band Patient awake    Reviewed: Allergy & Precautions, NPO status , Patient's Chart, lab work & pertinent test results  Airway Mallampati: II  TM Distance: >3 FB Neck ROM: Full    Dental no notable dental hx.    Pulmonary neg pulmonary ROS,    Pulmonary exam normal breath sounds clear to auscultation       Cardiovascular hypertension, Normal cardiovascular exam Rhythm:Regular Rate:Normal     Neuro/Psych negative neurological ROS  negative psych ROS   GI/Hepatic Neg liver ROS, PUD,   Endo/Other  negative endocrine ROS  Renal/GU negative Renal ROS  negative genitourinary   Musculoskeletal negative musculoskeletal ROS (+)   Abdominal   Peds negative pediatric ROS (+)  Hematology negative hematology ROS (+)   Anesthesia Other Findings   Reproductive/Obstetrics negative OB ROS                             Anesthesia Physical Anesthesia Plan  ASA: II  Anesthesia Plan: General   Post-op Pain Management:    Induction: Intravenous  PONV Risk Score and Plan:   Airway Management Planned: LMA  Additional Equipment:   Intra-op Plan:   Post-operative Plan: Extubation in OR  Informed Consent: I have reviewed the patients History and Physical, chart, labs and discussed the procedure including the risks, benefits and alternatives for the proposed anesthesia with the patient or authorized representative who has indicated his/her understanding and acceptance.   Dental advisory given  Plan Discussed with: CRNA and Surgeon  Anesthesia Plan Comments:         Anesthesia Quick Evaluation

## 2017-09-14 NOTE — Brief Op Note (Signed)
09/14/2017  10:28 AM  PATIENT:  Steven Cain  84 y.o. male  PRE-OPERATIVE DIAGNOSIS:  CHRONIC LEFT URETERAL STRICTURE  POST-OPERATIVE DIAGNOSIS:  CHRONIC LEFT URETERAL STRICTURE  PROCEDURE:  Procedure(s): CYSTOSCOPY WITH RETROGRADE PYELOGRAM/URETERAL STENT PLACEMENT (Left)  SURGEON:  Surgeon(s) and Role:    * [name], MD - Primary  PHYSICIAN ASSISTANT:   ASSISTANTS: none   ANESTHESIA:   general  EBL:  0 mL   BLOOD ADMINISTERED:none  DRAINS: none   LOCAL MEDICATIONS USED:  NONE  SPECIMEN:  Source of Specimen:  stent  DISPOSITION OF SPECIMEN:  discard  COUNTS:  YES  TOURNIQUET:  * No tourniquets in log *  DICTATION: .Other Dictation: Dictation Number [id number]  PLAN OF CARE: Discharge to home after PACU  PATIENT DISPOSITION:  PACU - hemodynamically stable.   Delay start of Pharmacological VTE agent (>24hrs) due to surgical blood loss or risk of bleeding: not applicable

## 2017-09-14 NOTE — H&P (Signed)
HENCE Steven Cain is an 84 y.o. male.    Chief Complaint: Pre-op LEFT Ureteral Stent Exchange  HPI:   1 - Obstructing Left Ureteral Stricture - worrisome soft tissue thickening of distal left ureter by CT and operative retrograde pyelogram 05/06/14. Now s/p stenting. No bladder lesion or additional lesions by imaging. He has h/o extensive smokeless tobacco exposure.    Recent Course:   05/2014 - Left Ureteroscopy / biopsy / brushing / washing / stent change (6x26)- path just mild atypia only, no frank carcinoma,   10/2014 - Repeat Left Ureteroscopy / Biopsy / stent exchange - path benign, no progression of stricture; 03/2015 Left stent change (7x26), no intraluminal filling defects  09/2015 - Stent change 7x26;  10/2016 - Stent change 7/26;    2 - Incontinence - Long h/o near total incontinence s/p TURP previously by Dr. Rosana Hoes. Manages with adult diapers x years.    PMH sig for Billroth II procedure R+Y, bilateral inguinal hernia repair with mesh, alcoholism (now very involved and compliant with AA) His PCP is Dr. Virgina Jock. 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    Today [name] is seen to proceed with elective left ureteral stent change. No interval fevers. He has been on cipro pre-op for asymptomatic bacteruria noted on pre-op CX.     Past Medical History:  Diagnosis Date  . Anxiety   . Arthritis    DJD  . Benign enlargement of prostate    frequent UTI, trim of prostate done , with some issues of incontinence now.  . Bronchitis    chronic- usually has phelgm often  . Depression   . Dyspnea    with excertion  . Fluid retention in legs    occasional use of diuretic as needed  . GI bleed   . Hard of hearing    hear more on left side- no hearing aid  . History of transfusion   . Hypertension   . Perforated, severe stomach ulcer ([place])   . Spinal stenosis   . Transfusion history    none recent  . Ureteral stricture    CHRONIC  . UTI (urinary tract  infection)    frequent    Past Surgical History:  Procedure Laterality Date  . ABDOMINAL SURGERY     bilroths tube '74  . COLONOSCOPY N/A 09/29/2014   Procedure: COLONOSCOPY;  Surgeon: Beryle Beams, MD;  Location: [facility];  Service: Endoscopy;  Laterality: N/A;  . CYSTOSCOPY W/ URETERAL STENT PLACEMENT Left 05/06/2014   Procedure: CYSTOSCOPY WITH RETROGRADE PYELOGRAM/URETERAL STENT PLACEMENT;  Surgeon: [name], MD;  Location: [facility];  Service: Urology;  Laterality: Left;  . CYSTOSCOPY W/ URETERAL STENT PLACEMENT Left 03/31/2015   Procedure: CYSTOSCOPY WITH LEFT RETROGRADE PYELOGRAM/URETERAL STENT EXCHANGE;  Surgeon: [name], MD;  Location: [facility];  Service: Urology;  Laterality: Left;  . CYSTOSCOPY W/ URETERAL STENT PLACEMENT Left 09/15/2015   Procedure: CYSTOSCOPY WITH LEFT RETROGRADE PYELOGRAM/URETERAL STENT EXCHANGE ;  Surgeon: [name], MD;  Location: [facility];  Service: Urology;  Laterality: Left;  . CYSTOSCOPY W/ URETERAL STENT PLACEMENT Left 04/28/2016   Procedure: CYSTOSCOPY WITH RETROGRADE PYELOGRAM/URETERAL STENT EXCHANGE;  Surgeon: [name], MD;  Location: [facility];  Service: Urology;  Laterality: Left;  . CYSTOSCOPY W/ URETERAL STENT PLACEMENT Left 11/08/2016   Procedure: CYSTOSCOPY WITH RETROGRADE PYELOGRAM/URETERAL STENT EXCHANGE;  Surgeon: [name], MD;  Location: [facility];  Service: Urology;  Laterality: Left;  . CYSTOSCOPY WITH RETROGRADE PYELOGRAM,  URETEROSCOPY AND STENT PLACEMENT Left 10/21/2014   Procedure: CYSTOSCOPY WITH RETROGRADE PYELOGRAM, URETEROSCOPY,BIOPSY AND STENT CHANGE;  Surgeon: [name], MD;  Location: [facility];  Service: Urology;  Laterality: Left;  . CYSTOSCOPY/RETROGRADE/URETEROSCOPY Left 06/03/2014   Procedure: CYSTOSCOPY/RETROGRADE/URETEROSCOPY WITH BIOPSY,STENT EXCHANGE;  Surgeon: [name], MD;  Location: [facility];  Service: Urology;  Laterality: Left;  . ESOPHAGOGASTRODUODENOSCOPY N/A 09/28/2014   Procedure:  ESOPHAGOGASTRODUODENOSCOPY (EGD);  Surgeon: Beryle Beams, MD;  Location: [facility];  Service: Endoscopy;  Laterality: N/A;  . GASTRIC ROUX-EN-Y     '74  . HERNIA REPAIR     bilateral hernia repairs   . TONSILLECTOMY    . TOTAL HIP ARTHROPLASTY Left 05/19/2016   Procedure: LEFT TOTAL HIP ARTHROPLASTY ANTERIOR APPROACH;  Surgeon: Mcarthur Rossetti, MD;  Location: [facility];  Service: Orthopedics;  Laterality: Left;  . TRANSURETHRAL RESECTION OF PROSTATE      No family history on file. Social History:  reports that  has never smoked. He has quit using smokeless tobacco. His smokeless tobacco use included chew. He reports that he does not drink alcohol or use drugs.  Allergies: No Known Allergies  No medications prior to admission.    No results found for this or any previous visit (from the past 48 hour(s)). No results found.  Review of Systems  Constitutional: Negative.  Negative for chills and fever.  HENT: Negative.   Eyes: Negative.   Respiratory: Negative.   Cardiovascular: Negative.   Gastrointestinal: Negative.   Genitourinary: Negative.   Musculoskeletal: Negative.   Skin: Negative.   Neurological: Negative.   Endo/Heme/Allergies: Negative.   Psychiatric/Behavioral: Negative.     There were no vitals taken for this visit. Physical Exam  Constitutional: He appears well-developed.  Mild demantia, but AOx3. AT baseline.   HENT:  Head: Normocephalic.  Eyes: Pupils are equal, round, and reactive to light.  Cardiovascular: Normal rate.  Respiratory: Effort normal.  GI: Soft.  Genitourinary:  Genitourinary Comments: No CVAT  Musculoskeletal: Normal range of motion.  Neurological: He is alert.  Skin: Skin is warm.  Psychiatric: He has a normal mood and affect.     Assessment/Plan  Proceed as planned with LEFT Ureteral stent change. Risks, benefits, alternatives, expected peri-op course discussed previously and reiterated today.   [name],  MD 09/14/2017, 6:40 AM

## 2017-09-14 NOTE — Transfer of Care (Signed)
Immediate Anesthesia Transfer of Care Note  Patient: Steven Cain  Procedure(s) Performed: CYSTOSCOPY WITH RETROGRADE PYELOGRAM/URETERAL STENT PLACEMENT (Left )  Patient Location: PACU  Anesthesia Type:General  Level of Consciousness: sedated  Airway & Oxygen Therapy: Patient Spontanous Breathing and Patient connected to face mask oxygen  Post-op Assessment: Report given to RN and Post -op Vital signs reviewed and stable  Post vital signs: Reviewed and stable  Last Vitals:  Vitals:   09/14/17 0810  BP: (!) 169/87  Pulse: 85  Resp: 18  Temp: 36.5 C  SpO2: 98%    Last Pain:  Vitals:   09/14/17 0810  TempSrc: Oral      Patients Stated Pain Goal: 3 (89/21/19 4174)  Complications: No apparent anesthesia complications

## 2017-09-14 NOTE — Anesthesia Postprocedure Evaluation (Signed)
Anesthesia Post Note  Patient: Olegario Shearer  Procedure(s) Performed: CYSTOSCOPY WITH RETROGRADE PYELOGRAM/URETERAL STENT PLACEMENT (Left )     Patient location during evaluation: PACU Anesthesia Type: General Level of consciousness: awake and alert Pain management: pain level controlled Vital Signs Assessment: post-procedure vital signs reviewed and stable Respiratory status: spontaneous breathing, nonlabored ventilation, respiratory function stable and patient connected to nasal cannula oxygen Cardiovascular status: blood pressure returned to baseline and stable Postop Assessment: no apparent nausea or vomiting Anesthetic complications: no    Last Vitals:  Vitals:   09/14/17 1045 09/14/17 1100  BP: (!) 173/80 (!) 158/77  Pulse: 71 71  Resp: 20 18  Temp:  (!) 36.4 C  SpO2: 100% 97%    Last Pain:  Vitals:   09/14/17 0810  TempSrc: Oral                 Danae Oland S

## 2017-09-14 NOTE — Anesthesia Procedure Notes (Signed)
Procedure Name: LMA Insertion Date/Time: 09/14/2017 10:14 AM Performed by: Talbot Grumbling, CRNA Pre-anesthesia Checklist: Patient identified, Emergency Drugs available, Suction available and Patient being monitored Patient Re-evaluated:Patient Re-evaluated prior to induction Oxygen Delivery Method: Circle system utilized Preoxygenation: Pre-oxygenation with 100% oxygen Induction Type: IV induction Ventilation: Mask ventilation without difficulty LMA: LMA inserted LMA Size: 5.0 Placement Confirmation: positive ETCO2 and breath sounds checked- equal and bilateral Tube secured with: Tape Dental Injury: Teeth and Oropharynx as per pre-operative assessment

## 2017-09-17 NOTE — Op Note (Signed)
NAME:  [name], [name] NO.:  MEDICAL RECORD NO.:  [id number]  LOCATION:                                 FACILITY:  PHYSICIAN:  [name], MD          DATE OF BIRTH:  DATE OF PROCEDURE: 09/14/2017                               OPERATIVE REPORT   DIAGNOSIS:  Chronic left ureteral stricture.  PROCEDURES: 1. Cystoscopy with left retrograde pyelogram and interpretation. 2. Exchange of left ureteral stent, 7 x 26 Contour, no tether.  ESTIMATED BLOOD LOSS:  Nil.  COMPLICATION:  None.  SPECIMEN:  In situ stent, set aside for discard.  FINDINGS: 1. Significant left hydronephrosis consistent with known stricture. 2. Successful replacement of the left ureteral stent, proximal in the     renal pelvis and distal in the urinary bladder.  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  He has very poor functional baseline and is not the operative candidate for definitive repair.  He has elected to be managed with chronic stenting change approximately every 6-9 months and done well with this for number of years.  He now presents for elective stent change today.  Informed consent was obtained and placed in the medical record.  PROCEDURE IN DETAIL:  [name], was verified. Procedure being left ureteral stent change was confirmed.  Procedure was carried out.  Time-out was performed.  Intravenous antibiotics were administered.  General LMA anesthesia introduced.  The patient was placed into a low lithotomy position.  Sterile field was created by prepping and draping the patient's penis, perineum and proximal thighs using iodine.  He has stable glanular hypospadias.  Cystourethroscopy was performed using a 22-French cystoscope with offset lens.  Inspection of the urinary bladder revealed distal end of left ureteral stent in situ.  This was grasped and brought to the level of the urethral  meatus, through which, a 0.038 Zip wire was advanced to the level of the kidney. Stent was exchanged for an open-ended catheter and left retrograde pyelogram was obtained.  Left retrograde pyelogram demonstrated a single left ureter with single- system left kidney.  There was moderate hydroureteronephrosis to the level of distal ureter and known stricture.  Next, a new 7 x 26 Contour- type stent was placed using cystoscopic and fluoroscopic guidance over the Zip wire.  Good proximal and distal deployment were noted.  Bladder was emptied per cystoscope.  Procedure was then terminated.  The patient tolerated the procedure well.  There were no immediate periprocedural complications.  The patient was taken to the postanesthesia care unit in stable condition.          ______________________________ [name], MD     TM/MEDQ  D:  09/14/2017  T:  09/14/2017  Job:  [id number]

## 2017-09-18 DIAGNOSIS — R82998 Other abnormal findings in urine: Secondary | ICD-10-CM | POA: Diagnosis not present

## 2017-09-18 DIAGNOSIS — I10 Essential (primary) hypertension: Secondary | ICD-10-CM | POA: Diagnosis not present

## 2017-09-18 DIAGNOSIS — Z125 Encounter for screening for malignant neoplasm of prostate: Secondary | ICD-10-CM | POA: Diagnosis not present

## 2017-09-18 DIAGNOSIS — E538 Deficiency of other specified B group vitamins: Secondary | ICD-10-CM | POA: Diagnosis not present

## 2017-09-18 DIAGNOSIS — R7309 Other abnormal glucose: Secondary | ICD-10-CM | POA: Diagnosis not present

## 2017-09-25 DIAGNOSIS — N401 Enlarged prostate with lower urinary tract symptoms: Secondary | ICD-10-CM | POA: Diagnosis not present

## 2017-09-25 DIAGNOSIS — D692 Other nonthrombocytopenic purpura: Secondary | ICD-10-CM | POA: Diagnosis not present

## 2017-09-25 DIAGNOSIS — F325 Major depressive disorder, single episode, in full remission: Secondary | ICD-10-CM | POA: Diagnosis not present

## 2017-09-25 DIAGNOSIS — I129 Hypertensive chronic kidney disease with stage 1 through stage 4 chronic kidney disease, or unspecified chronic kidney disease: Secondary | ICD-10-CM | POA: Diagnosis not present

## 2017-09-25 DIAGNOSIS — N2889 Other specified disorders of kidney and ureter: Secondary | ICD-10-CM | POA: Diagnosis not present

## 2017-09-25 DIAGNOSIS — Z0000 Encounter for general adult medical examination without abnormal findings: Secondary | ICD-10-CM | POA: Diagnosis not present

## 2017-09-25 DIAGNOSIS — E871 Hypo-osmolality and hyponatremia: Secondary | ICD-10-CM | POA: Diagnosis not present

## 2017-09-25 DIAGNOSIS — Z6834 Body mass index (BMI) 34.0-34.9, adult: Secondary | ICD-10-CM | POA: Diagnosis not present

## 2017-09-25 DIAGNOSIS — I878 Other specified disorders of veins: Secondary | ICD-10-CM | POA: Diagnosis not present

## 2017-09-25 DIAGNOSIS — E538 Deficiency of other specified B group vitamins: Secondary | ICD-10-CM | POA: Diagnosis not present

## 2017-09-25 DIAGNOSIS — Z1389 Encounter for screening for other disorder: Secondary | ICD-10-CM | POA: Diagnosis not present

## 2017-09-25 DIAGNOSIS — N183 Chronic kidney disease, stage 3 (moderate): Secondary | ICD-10-CM | POA: Diagnosis not present

## 2017-09-27 DIAGNOSIS — Z1212 Encounter for screening for malignant neoplasm of rectum: Secondary | ICD-10-CM | POA: Diagnosis not present

## 2017-10-12 DIAGNOSIS — H25811 Combined forms of age-related cataract, right eye: Secondary | ICD-10-CM | POA: Diagnosis not present

## 2017-10-12 DIAGNOSIS — D23121 Other benign neoplasm of skin of left upper eyelid, including canthus: Secondary | ICD-10-CM | POA: Diagnosis not present

## 2017-10-12 DIAGNOSIS — H35412 Lattice degeneration of retina, left eye: Secondary | ICD-10-CM | POA: Diagnosis not present

## 2017-10-12 DIAGNOSIS — H35372 Puckering of macula, left eye: Secondary | ICD-10-CM | POA: Diagnosis not present

## 2017-10-12 DIAGNOSIS — H2512 Age-related nuclear cataract, left eye: Secondary | ICD-10-CM | POA: Diagnosis not present

## 2017-11-19 DIAGNOSIS — D485 Neoplasm of uncertain behavior of skin: Secondary | ICD-10-CM | POA: Diagnosis not present

## 2017-11-19 DIAGNOSIS — B078 Other viral warts: Secondary | ICD-10-CM | POA: Diagnosis not present

## 2017-11-19 DIAGNOSIS — L820 Inflamed seborrheic keratosis: Secondary | ICD-10-CM | POA: Diagnosis not present

## 2018-01-23 ENCOUNTER — Other Ambulatory Visit: Payer: Self-pay | Admitting: Ophthalmology

## 2018-01-23 DIAGNOSIS — D485 Neoplasm of uncertain behavior of skin: Secondary | ICD-10-CM | POA: Diagnosis not present

## 2018-01-23 DIAGNOSIS — D4989 Neoplasm of unspecified behavior of other specified sites: Secondary | ICD-10-CM | POA: Diagnosis not present

## 2018-01-23 DIAGNOSIS — B078 Other viral warts: Secondary | ICD-10-CM | POA: Diagnosis not present

## 2018-01-23 DIAGNOSIS — L820 Inflamed seborrheic keratosis: Secondary | ICD-10-CM | POA: Diagnosis not present

## 2018-01-23 DIAGNOSIS — L570 Actinic keratosis: Secondary | ICD-10-CM | POA: Diagnosis not present

## 2018-03-16 IMAGING — RF DG ABDOMEN 1V
1 series · 2 of 2 positions shown · non-contrast
Comparison: CT abdomen and pelvis 05/06/2014

FLUOROSCOPY TIME:  0 minutes 6 seconds

Images obtained: 2

CLINICAL DATA: LEFT stent exchange

EXAM:
ABDOMEN - 1 VIEW; DG C-ARM 1-60 MIN-NO REPORT

[Series 1: run · 2 of 2 slices shown]
[im 1/2]
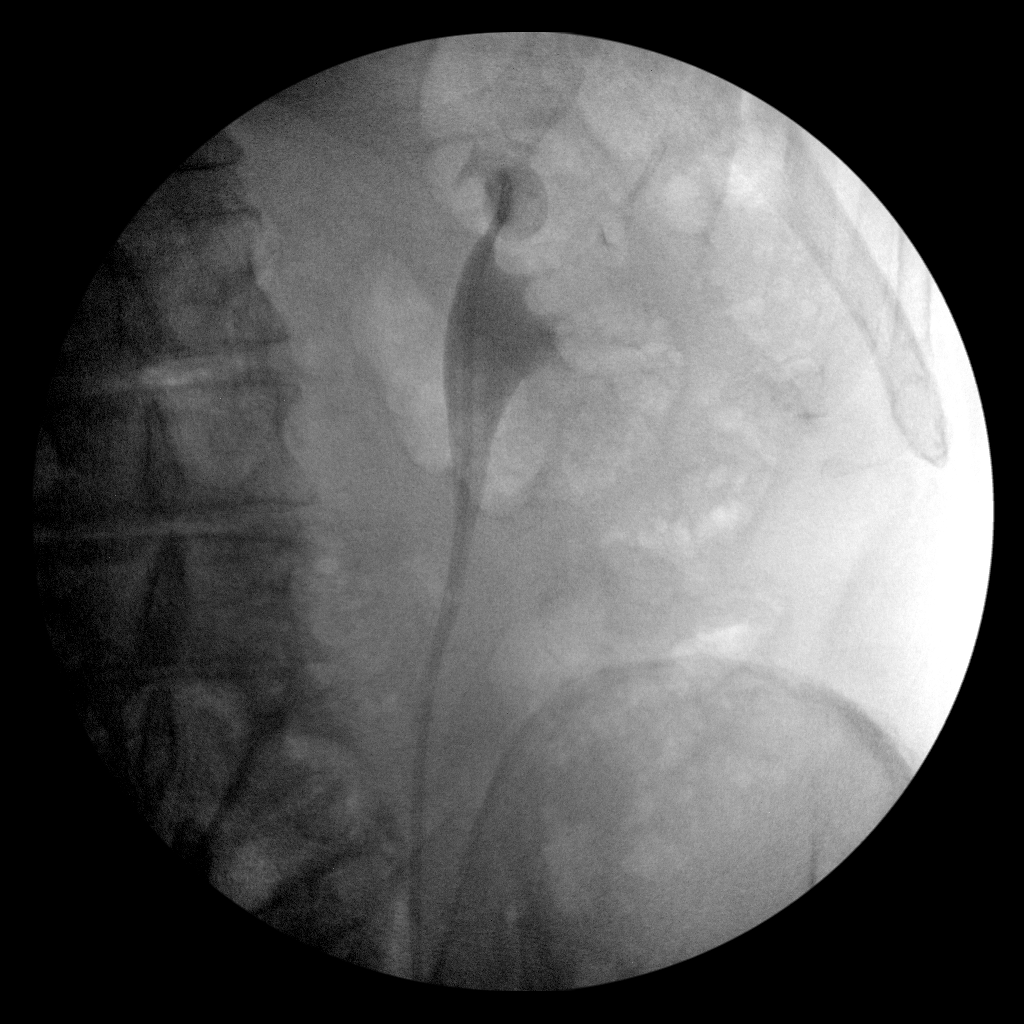
[im 2/2]
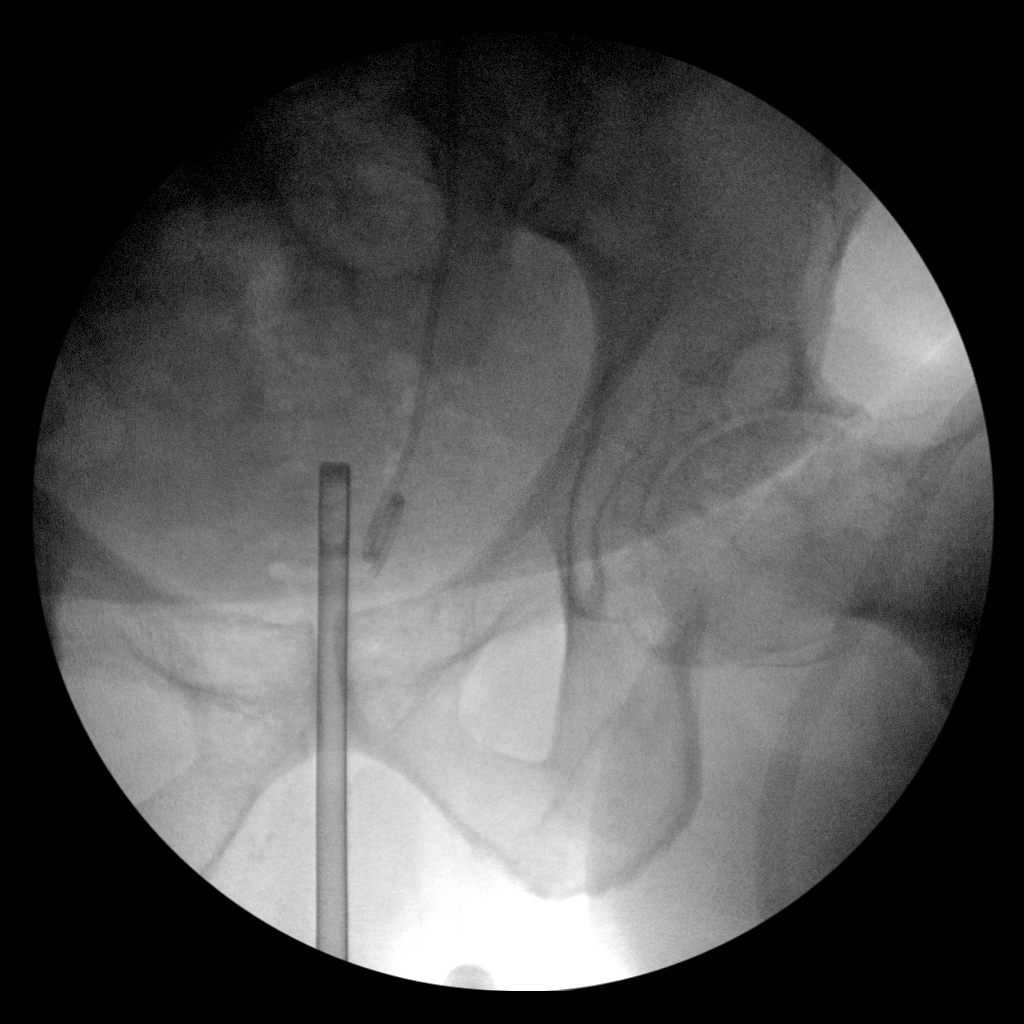

[2 of 2 positions shown; findings below may reference images not displayed]

FINDINGS: Two submitted images demonstrate presence of a LEFT ureteral stent
extending from the upper pole of the LEFT renal collecting system to
the urinary bladder.

Small off contrast material opacifies a minimally prominent LEFT
renal pelvis.
IMPRESSION: LEFT ureteral stent.

## 2018-03-26 DIAGNOSIS — N3941 Urge incontinence: Secondary | ICD-10-CM | POA: Diagnosis not present

## 2018-03-26 DIAGNOSIS — N135 Crossing vessel and stricture of ureter without hydronephrosis: Secondary | ICD-10-CM | POA: Diagnosis not present

## 2018-03-28 ENCOUNTER — Other Ambulatory Visit: Payer: Self-pay | Admitting: Urology

## 2018-04-03 ENCOUNTER — Encounter (HOSPITAL_BASED_OUTPATIENT_CLINIC_OR_DEPARTMENT_OTHER): Payer: Self-pay

## 2018-04-05 ENCOUNTER — Other Ambulatory Visit: Payer: Self-pay

## 2018-04-05 ENCOUNTER — Encounter (HOSPITAL_BASED_OUTPATIENT_CLINIC_OR_DEPARTMENT_OTHER): Payer: Self-pay

## 2018-04-05 NOTE — Progress Notes (Signed)
Spoke with:  [name] (wife) NPO:  No food after midnight/Clear liquids until 9:00AM DOS Arrival time:  1300 Labs: Istat 4, EKG AM medications: Amlodipine, Paroxetine Pre op orders:  Yes Ride home:  Lou Miner (wife) [phone]

## 2018-04-10 ENCOUNTER — Other Ambulatory Visit: Payer: Self-pay

## 2018-04-10 ENCOUNTER — Encounter (HOSPITAL_BASED_OUTPATIENT_CLINIC_OR_DEPARTMENT_OTHER): Payer: Self-pay | Admitting: Certified Registered Nurse Anesthetist

## 2018-04-10 ENCOUNTER — Ambulatory Visit (HOSPITAL_BASED_OUTPATIENT_CLINIC_OR_DEPARTMENT_OTHER): Payer: Medicare Other | Admitting: Certified Registered Nurse Anesthetist

## 2018-04-10 ENCOUNTER — Ambulatory Visit (HOSPITAL_BASED_OUTPATIENT_CLINIC_OR_DEPARTMENT_OTHER)
Admission: RE | Admit: 2018-04-10 | Discharge: 2018-04-10 | Disposition: A | Discharge status: Home / Self Care | Payer: Medicare Other | Source: Ambulatory Visit | Attending: Urology | Admitting: Urology

## 2018-04-10 ENCOUNTER — Encounter (HOSPITAL_BASED_OUTPATIENT_CLINIC_OR_DEPARTMENT_OTHER)
Admission: RE | Disposition: A | Discharge status: Home / Self Care | Payer: Self-pay | Source: Ambulatory Visit | Attending: Urology

## 2018-04-10 DIAGNOSIS — N131 Hydronephrosis with ureteral stricture, not elsewhere classified: Secondary | ICD-10-CM | POA: Insufficient documentation

## 2018-04-10 DIAGNOSIS — I10 Essential (primary) hypertension: Secondary | ICD-10-CM | POA: Insufficient documentation

## 2018-04-10 DIAGNOSIS — Z87891 Personal history of nicotine dependence: Secondary | ICD-10-CM | POA: Insufficient documentation

## 2018-04-10 DIAGNOSIS — Z96642 Presence of left artificial hip joint: Secondary | ICD-10-CM | POA: Insufficient documentation

## 2018-04-10 DIAGNOSIS — N135 Crossing vessel and stricture of ureter without hydronephrosis: Secondary | ICD-10-CM | POA: Diagnosis not present

## 2018-04-10 HISTORY — DX: Heart disease, unspecified: I51.9

## 2018-04-10 HISTORY — PX: CYSTOSCOPY W/ URETERAL STENT PLACEMENT: SHX1429

## 2018-04-10 HISTORY — DX: Unspecified hydronephrosis: N13.30

## 2018-04-10 HISTORY — DX: Unspecified chronic bronchitis: J42

## 2018-04-10 HISTORY — DX: Other ill-defined heart diseases: I51.89

## 2018-04-10 HISTORY — DX: Polyneuropathy, unspecified: G62.9

## 2018-04-10 HISTORY — DX: Personal history of colon polyps, unspecified: Z86.0100

## 2018-04-10 HISTORY — DX: Personal history of colonic polyps: Z86.010

## 2018-04-10 HISTORY — DX: Rheumatic tricuspid insufficiency: I07.1

## 2018-04-10 HISTORY — DX: Unspecified urinary incontinence: R32

## 2018-04-10 HISTORY — DX: Nonrheumatic aortic (valve) insufficiency: I35.1

## 2018-04-10 HISTORY — DX: Presence of spectacles and contact lenses: Z97.3

## 2018-04-10 HISTORY — DX: Cardiomegaly: I51.7

## 2018-04-10 LAB — POCT I-STAT 4, (NA,K, GLUC, HGB,HCT)
GLUCOSE: 108 mg/dL — AB (ref 70–99)
HCT: 38 % — ABNORMAL LOW (ref 39.0–52.0)
HEMOGLOBIN: 12.9 g/dL — AB (ref 13.0–17.0)
Potassium: 4.2 mmol/L (ref 3.5–5.1)
SODIUM: 135 mmol/L (ref 135–145)

## 2018-04-10 SURGERY — CYSTOSCOPY, WITH RETROGRADE PYELOGRAM AND URETERAL STENT INSERTION
Anesthesia: General | Site: Bladder | Laterality: Left

## 2018-04-10 MED ORDER — DEXAMETHASONE SODIUM PHOSPHATE 10 MG/ML IJ SOLN
INTRAMUSCULAR | Status: AC
  Filled 2018-04-10: qty 1

## 2018-04-10 MED ORDER — FENTANYL CITRATE (PF) 100 MCG/2ML IJ SOLN
25.0000 ug | INTRAMUSCULAR | Status: DC | PRN
  Filled 2018-04-10: qty 1

## 2018-04-10 MED ORDER — MEPERIDINE HCL 25 MG/ML IJ SOLN
6.2500 mg | INTRAMUSCULAR | Status: DC | PRN
  Filled 2018-04-10: qty 1

## 2018-04-10 MED ORDER — FENTANYL CITRATE (PF) 100 MCG/2ML IJ SOLN
INTRAMUSCULAR | Status: DC | PRN
  Administered 2018-04-10: 25 ug via INTRAVENOUS

## 2018-04-10 MED ORDER — LIDOCAINE 2% (20 MG/ML) 5 ML SYRINGE
INTRAMUSCULAR | Status: AC
  Filled 2018-04-10: qty 5

## 2018-04-10 MED ORDER — ONDANSETRON HCL 4 MG/2ML IJ SOLN
INTRAMUSCULAR | Status: DC | PRN
  Administered 2018-04-10: 4 mg via INTRAVENOUS

## 2018-04-10 MED ORDER — DEXAMETHASONE SODIUM PHOSPHATE 10 MG/ML IJ SOLN
INTRAMUSCULAR | Status: DC | PRN
  Administered 2018-04-10: 5 mg via INTRAVENOUS

## 2018-04-10 MED ORDER — IOHEXOL 300 MG/ML  SOLN
INTRAMUSCULAR | Status: DC | PRN
  Administered 2018-04-10: 9 mL via URETHRAL

## 2018-04-10 MED ORDER — CEFTRIAXONE SODIUM 1 G IJ SOLR
INTRAMUSCULAR | Status: AC
  Filled 2018-04-10: qty 10

## 2018-04-10 MED ORDER — PROPOFOL 10 MG/ML IV BOLUS
INTRAVENOUS | Status: AC
  Filled 2018-04-10: qty 20

## 2018-04-10 MED ORDER — PROPOFOL 10 MG/ML IV BOLUS
INTRAVENOUS | Status: DC | PRN
  Administered 2018-04-10: 100 mg via INTRAVENOUS

## 2018-04-10 MED ORDER — ONDANSETRON HCL 4 MG/2ML IJ SOLN
INTRAMUSCULAR | Status: AC
  Filled 2018-04-10: qty 2

## 2018-04-10 MED ORDER — ACETAMINOPHEN 325 MG PO TABS
325.0000 mg | ORAL_TABLET | ORAL | Status: DC | PRN
  Filled 2018-04-10: qty 2

## 2018-04-10 MED ORDER — ACETAMINOPHEN 160 MG/5ML PO SOLN
325.0000 mg | ORAL | Status: DC | PRN
  Filled 2018-04-10: qty 20.3

## 2018-04-10 MED ORDER — SODIUM CHLORIDE 0.9 % IV SOLN
INTRAVENOUS | Status: AC
  Filled 2018-04-10: qty 100

## 2018-04-10 MED ORDER — FENTANYL CITRATE (PF) 100 MCG/2ML IJ SOLN
INTRAMUSCULAR | Status: AC
  Filled 2018-04-10: qty 2

## 2018-04-10 MED ORDER — LIDOCAINE 2% (20 MG/ML) 5 ML SYRINGE
INTRAMUSCULAR | Status: DC | PRN
  Administered 2018-04-10: 60 mg via INTRAVENOUS

## 2018-04-10 MED ORDER — SODIUM CHLORIDE 0.9 % IV SOLN
1.0000 g | INTRAVENOUS | Status: AC
  Administered 2018-04-10: 1 g via INTRAVENOUS
  Filled 2018-04-10: qty 10

## 2018-04-10 MED ORDER — SODIUM CHLORIDE 0.9 % IV SOLN
INTRAVENOUS | Status: DC
  Administered 2018-04-10: 12:00:00 via INTRAVENOUS
  Filled 2018-04-10: qty 1000

## 2018-04-10 SURGICAL SUPPLY — 26 items
BAG DRAIN URO-CYSTO SKYTR STRL (DRAIN) ×3 IMPLANT
BAG DRN UROCATH (DRAIN) ×1
BASKET LASER NITINOL 1.9FR (BASKET) IMPLANT
BSKT STON RTRVL 120 1.9FR (BASKET)
CATH INTERMIT  6FR 70CM (CATHETERS) ×3 IMPLANT
CLOTH BEACON ORANGE TIMEOUT ST (SAFETY) ×3 IMPLANT
FIBER LASER FLEXIVA 365 (UROLOGICAL SUPPLIES) IMPLANT
FIBER LASER TRAC TIP (UROLOGICAL SUPPLIES) IMPLANT
GLOVE BIO SURGEON STRL SZ7.5 (GLOVE) ×3 IMPLANT
GOWN STRL REUS W/TWL LRG LVL3 (GOWN DISPOSABLE) ×3 IMPLANT
GUIDEWIRE ANG ZIPWIRE 038X150 (WIRE) ×3 IMPLANT
GUIDEWIRE STR DUAL SENSOR (WIRE) ×3 IMPLANT
INFUSOR MANOMETER BAG 3000ML (MISCELLANEOUS) ×1 IMPLANT
IV NS 1000ML (IV SOLUTION) ×3
IV NS 1000ML BAXH (IV SOLUTION) ×1 IMPLANT
IV NS IRRIG 3000ML ARTHROMATIC (IV SOLUTION) ×3 IMPLANT
KIT TURNOVER CYSTO (KITS) ×3 IMPLANT
MANIFOLD NEPTUNE II (INSTRUMENTS) ×3 IMPLANT
NS IRRIG 500ML POUR BTL (IV SOLUTION) ×6 IMPLANT
PACK CYSTO (CUSTOM PROCEDURE TRAY) ×3 IMPLANT
STENT CONTOUR 7FRX26 (STENTS) ×2 IMPLANT
SYR 10ML LL (SYRINGE) ×3 IMPLANT
TUBE CONNECTING 12'X1/4 (SUCTIONS)
TUBE CONNECTING 12X1/4 (SUCTIONS) IMPLANT
TUBE FEEDING 8FR 16IN STR KANG (MISCELLANEOUS) IMPLANT
TUBING UROLOGY SET (TUBING) IMPLANT

## 2018-04-10 NOTE — Op Note (Signed)
Steven Cain, Steven Cain MEDICAL RECORD [id number] ACCOUNT [id number] DATE OF BIRTH:1931-04-22 FACILITY: [facility] LOCATION: [facility] PHYSICIAN:Nessie Nong Tresa Moore, MD  OPERATIVE REPORT  DATE OF PROCEDURE:  04/10/2018  PREOPERATIVE DIAGNOSIS:  Chronic left ureteral stricture.  PROCEDURE: 1.  Cystoscopy, left retrograde pyelogram, interpretation. 2.  Exchange of left ureteral stent 7 x 26 Contour, no tether.  ESTIMATED BLOOD LOSS:  Nil.  COMPLICATIONS:  None.  SPECIMENS:  Left ureteral stent for discard.  It was inspected and intact.  FINDINGS: 1.  Chronic left distal ureteral stricture. 2.  Successful replacement of left ureteral stent proximal renal pelvis sling or bladder. 3.  Prior transurethral resection of the prostate defect with wide open prostatic fossa.  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  He has done quite well with this as it has  avoided severe infectious complications which he had prior to chronic stenting.  It has been approximately 6 months since his last stent exchange and he presents for this today.  Informed consent was signed and then placed in medical record.  CONDITION OF PROCEDURE:  The patient identified.  Procedure being left ureteral stent exchange was confirmed.  Procedure timeout was performed.  Intravenous access administered, general LMA anesthesia induced.  The patient was placed into a low lithotomy  position, sterile field was created by prepping and draping the patient's penis, perineum and proximal thighs using iodine.  Next, cystourethroscopy was performed with a 22-French rigid cystoscope offset lens.  The patient does have uncorrected  hypospadias and exquisite care was taken to avoid trauma to his true meatus which resides on his distal ventral shaft.  Inspection of prostatic urethra  revealed a prior TURP defect.  Inspection of the urinary bladder revealed distal end of the stent in  situ with mild encrustation.  It was grasped and brought out in its entirety set aside for discard.  It was inspected and intact.  The left ureteral orifice was then cannulated with 6-French open end catheter and left retrograde pyelogram was obtained.  Left retrograde pyelogram demonstrated a single left ureter and single system left kidney.  There was long segment narrowing in the mid to distal ureter consistent with known chronic stricture.  There was minimal hydronephrosis.  A 0.038 sensor wire was  advanced to lower pole of which a new 7 x 26 Contour type stent was carefully placed using cystoscopic and fluoroscopic guidance.  Good proximal and distal points were noted.  Bladder was empty per cystoscope.  Procedure terminated.  The patient  tolerated the procedure well.  No immediate complications.  The patient was taken to postanesthesia care in stable condition.  TN/NUANCE  D:04/10/2018 T:04/10/2018 JOB:[id number]

## 2018-04-10 NOTE — Anesthesia Postprocedure Evaluation (Signed)
Anesthesia Post Note  Patient: Steven Cain  Procedure(s) Performed: CYSTOSCOPY WITH LEFT RETROGRADE URETERAL STENT EXCHANGE (Left Bladder)     Patient location during evaluation: PACU Anesthesia Type: General Level of consciousness: sedated and patient cooperative Pain management: pain level controlled Vital Signs Assessment: post-procedure vital signs reviewed and stable Respiratory status: spontaneous breathing Cardiovascular status: stable Anesthetic complications: no    Last Vitals:  Vitals:   04/10/18 1350 04/10/18 1420  BP:  (!) 172/72  Pulse: 82 76  Resp: (!) 22 14  Temp:  36.8 C  SpO2: 99% 97%    Last Pain:  Vitals:   04/10/18 1420  TempSrc:   PainSc: 0-No pain                 Nolon Nations

## 2018-04-10 NOTE — Discharge Instructions (Signed)
1 - You may have urinary urgency (bladder spasms) and bloody urine on / off with stent in place. This is normal.  2 - Call MD or go to ER for fever >102, severe pain / nausea / vomiting not relieved by medications, or acute change in medical status  Post Anesthesia Home Care Instructions  Activity: Get plenty of rest for the remainder of the day. A responsible individual must stay with you for 24 hours following the procedure.  For the next 24 hours, DO NOT: -Drive a car -Operate machinery -Drink alcoholic beverages -Take any medication unless instructed by your physician -Make any legal decisions or sign important papers.  Meals: Start with liquid foods such as gelatin or soup. Progress to regular foods as tolerated. Avoid greasy, spicy, heavy foods. If nausea and/or vomiting occur, drink only clear liquids until the nausea and/or vomiting subsides. Call your physician if vomiting continues.  Special Instructions/Symptoms: Your throat may feel dry or sore from the anesthesia or the breathing tube placed in your throat during surgery. If this causes discomfort, gargle with warm salt water. The discomfort should disappear within 24 hours.  If you had a scopolamine patch placed behind your ear for the management of post- operative nausea and/or vomiting:  1. The medication in the patch is effective for 72 hours, after which it should be removed.  Wrap patch in a tissue and discard in the trash. Wash hands thoroughly with soap and water. 2. You may remove the patch earlier than 72 hours if you experience unpleasant side effects which may include dry mouth, dizziness or visual disturbances. 3. Avoid touching the patch. Wash your hands with soap and water after contact with the patch.     

## 2018-04-10 NOTE — Brief Op Note (Signed)
04/10/2018  1:17 PM  PATIENT:  Olegario Shearer  84 y.o. male  PRE-OPERATIVE DIAGNOSIS:  left ureteral stricture  POST-OPERATIVE DIAGNOSIS:  left ureteral stricture  PROCEDURE:  Procedure(s): CYSTOSCOPY WITH LEFT RETROGRADE URETERAL STENT EXCHANGE (Left)  SURGEON:  Surgeon(s) and Role:    * [name], MD - Primary  PHYSICIAN ASSISTANT:   ASSISTANTS: none   ANESTHESIA:   general  EBL:  1 mL   BLOOD ADMINISTERED:none  DRAINS: none   LOCAL MEDICATIONS USED:  NONE  SPECIMEN:  No Specimen  DISPOSITION OF SPECIMEN:  N/A  COUNTS:  YES  TOURNIQUET:  * No tourniquets in log *  DICTATION: .Other Dictation: Dictation Number [id number]  PLAN OF CARE: Discharge to home after PACU  PATIENT DISPOSITION:  PACU - hemodynamically stable.   Delay start of Pharmacological VTE agent (>24hrs) due to surgical blood loss or risk of bleeding: yes

## 2018-04-10 NOTE — Anesthesia Preprocedure Evaluation (Signed)
Anesthesia Evaluation  Patient identified by MRN, date of birth, ID band Patient awake    Reviewed: Allergy & Precautions, NPO status , Patient's Chart, lab work & pertinent test results  Airway Mallampati: II  TM Distance: >3 FB Neck ROM: Full    Dental  (+) Dental Advisory Given   Pulmonary neg pulmonary ROS,    Pulmonary exam normal breath sounds clear to auscultation       Cardiovascular hypertension, Normal cardiovascular exam Rhythm:Regular Rate:Normal     Neuro/Psych negative neurological ROS  negative psych ROS   GI/Hepatic Neg liver ROS, PUD,   Endo/Other  negative endocrine ROS  Renal/GU negative Renal ROS  negative genitourinary   Musculoskeletal negative musculoskeletal ROS (+)   Abdominal   Peds negative pediatric ROS (+)  Hematology negative hematology ROS (+)   Anesthesia Other Findings   Reproductive/Obstetrics negative OB ROS                            Anesthesia Physical  Anesthesia Plan  ASA: III  Anesthesia Plan: General   Post-op Pain Management:    Induction: Intravenous  PONV Risk Score and Plan: 3 and Ondansetron, Dexamethasone and Treatment may vary due to age or medical condition  Airway Management Planned: LMA  Additional Equipment:   Intra-op Plan:   Post-operative Plan: Extubation in OR  Informed Consent: I have reviewed the patients History and Physical, chart, labs and discussed the procedure including the risks, benefits and alternatives for the proposed anesthesia with the patient or authorized representative who has indicated his/her understanding and acceptance.   Dental advisory given  Plan Discussed with: CRNA  Anesthesia Plan Comments:        Anesthesia Quick Evaluation

## 2018-04-10 NOTE — Transfer of Care (Signed)
Immediate Anesthesia Transfer of Care Note  Patient: Olegario Shearer  Procedure(s) Performed: CYSTOSCOPY WITH LEFT RETROGRADE URETERAL STENT EXCHANGE (Left Bladder)  Patient Location: PACU  Anesthesia Type:General  Level of Consciousness: awake, alert  and oriented  Airway & Oxygen Therapy: Patient Spontanous Breathing and Patient connected to nasal cannula oxygen  Post-op Assessment: Report given to RN and Post -op Vital signs reviewed and stable  Post vital signs: Reviewed and stable  Last Vitals:  Vitals Value Taken Time  BP 180/76 04/10/2018  1:27 PM  Temp 36.8 C 04/10/2018  1:27 PM  Pulse 78 04/10/2018  1:29 PM  Resp 16 04/10/2018  1:29 PM  SpO2 99 % 04/10/2018  1:29 PM  Vitals shown include unvalidated device data.  Last Pain:  Vitals:   04/10/18 1224  TempSrc:   PainSc: 0-No pain      Patients Stated Pain Goal: 4 (27/25/36 6440)  Complications: No apparent anesthesia complications

## 2018-04-10 NOTE — Anesthesia Procedure Notes (Signed)
Procedure Name: LMA Insertion Date/Time: 04/10/2018 1:06 PM Performed by: Genelle Bal, CRNA Pre-anesthesia Checklist: Patient identified, Emergency Drugs available, Suction available and Patient being monitored Patient Re-evaluated:Patient Re-evaluated prior to induction Oxygen Delivery Method: Circle system utilized Preoxygenation: Pre-oxygenation with 100% oxygen Induction Type: IV induction Ventilation: Mask ventilation without difficulty LMA: LMA inserted LMA Size: 5.0 Number of attempts: 1 Placement Confirmation: positive ETCO2 Tube secured with: Tape Dental Injury: Teeth and Oropharynx as per pre-operative assessment

## 2018-04-10 NOTE — H&P (Signed)
Steven Cain is an 84 y.o. male.    Chief Complaint: Pre-OP LEFT Ureteral Stent Change  HPI:   1 - Obstructing Left Ureteral Stricture - worrisome soft tissue thickening of distal left ureter by CT and operative retrograde pyelogram 05/06/14. Now s/p stenting. No bladder lesion or additional lesions by imaging. He has h/o extensive smokeless tobacco exposure. He is NOT candidate for definitive repair.    Recent Course:   05/2014 - Left Ureteroscopy / biopsy / brushing / washing / stent change (6x26)- path just mild atypia only, no frank carcinoma,   10/2014 - Repeat Left Ureteroscopy / Biopsy / stent exchange - path benign, no progression of stricture; 03/2015 Left stent change (7x26), no intraluminal filling defects  09/2015 - Stent change 7x26;  10/2016 - Stent change 7/26;  09/2017 - Stent change 7x26 contour    2 - Incontinence - Long h/o near total incontinence s/p TURP previously by Dr. Rosana Hoes. Manages with adult diapers x years.    PMH sig for Billroth II procedure R+Y, bilateral inguinal hernia repair with mesh, alcoholism (now very involved and compliant with AA) His PCP is Dr. Virgina Jock. 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    Today [name] is seen to proceed with LEFT ureteral stent exchange. NO interval fevers.    Past Medical History:  Diagnosis Date  . Anxiety   . AR (aortic regurgitation) 03/23/2017   Mild, Noted on ECHO  . Arthritis    DJD  . Benign enlargement of prostate    frequent UTI, trim of prostate done , with some issues of incontinence now.  . Chronic bronchitis ([place])    chronic- usually has phelgm often  . Depression   . Dyspnea    with excertion  . Fluid retention in legs    occasional use of diuretic as needed  . GI bleed   . Grade I diastolic dysfunction   . Hard of hearing    hear more on left side- no hearing aid  . History of colon polyps   . History of transfusion   . Hydronephrosis 2018   Left  . Hypertension   .  Moderate concentric left ventricular hypertrophy 03/23/2017   Noted on ECHO  . Perforated, severe stomach ulcer ([place])   . Peripheral neuropathy   . Spinal stenosis   . TR (tricuspid regurgitation) 03/23/2017   Mild, Noted on ECHO  . Transfusion history    none recent  . Ureteral stricture    CHRONIC  . Urinary incontinence   . UTI (urinary tract infection)    frequent  . Wears glasses     Past Surgical History:  Procedure Laterality Date  . ABDOMINAL SURGERY     bilroths tube '74  . COLONOSCOPY N/A 09/29/2014   Procedure: COLONOSCOPY;  Surgeon: Beryle Beams, MD;  Location: [facility];  Service: Endoscopy;  Laterality: N/A;  . CYSTOSCOPY W/ URETERAL STENT PLACEMENT Left 05/06/2014   Procedure: CYSTOSCOPY WITH RETROGRADE PYELOGRAM/URETERAL STENT PLACEMENT;  Surgeon: [name], MD;  Location: [facility];  Service: Urology;  Laterality: Left;  . CYSTOSCOPY W/ URETERAL STENT PLACEMENT Left 03/31/2015   Procedure: CYSTOSCOPY WITH LEFT RETROGRADE PYELOGRAM/URETERAL STENT EXCHANGE;  Surgeon: [name], MD;  Location: [facility];  Service: Urology;  Laterality: Left;  . CYSTOSCOPY W/ URETERAL STENT PLACEMENT Left 09/15/2015   Procedure: CYSTOSCOPY WITH LEFT RETROGRADE PYELOGRAM/URETERAL STENT EXCHANGE ;  Surgeon: [name], MD;  Location: [facility];  Service: Urology;  Laterality: Left;  . CYSTOSCOPY W/ URETERAL STENT PLACEMENT Left 04/28/2016   Procedure: CYSTOSCOPY WITH RETROGRADE PYELOGRAM/URETERAL STENT EXCHANGE;  Surgeon: [name], MD;  [facility];  Service: Urology;  Laterality: Left;  . CYSTOSCOPY W/ URETERAL STENT PLACEMENT Left 11/08/2016   Procedure: CYSTOSCOPY WITH RETROGRADE PYELOGRAM/URETERAL STENT EXCHANGE;  Surgeon: [name], MD;  [facility];  Service: Urology;  Laterality: Left;  . CYSTOSCOPY W/ URETERAL STENT PLACEMENT Left 09/14/2017   Procedure: CYSTOSCOPY WITH RETROGRADE PYELOGRAM/URETERAL STENT PLACEMENT;  Surgeon: [name], MD;  Location:  [facility];  Service: Urology;  Laterality: Left;  . CYSTOSCOPY WITH RETROGRADE PYELOGRAM, URETEROSCOPY AND STENT PLACEMENT Left 10/21/2014   Procedure: CYSTOSCOPY WITH RETROGRADE PYELOGRAM, URETEROSCOPY,BIOPSY AND STENT CHANGE;  Surgeon: [name], MD;  [facility];  Service: Urology;  Laterality: Left;  . CYSTOSCOPY/RETROGRADE/URETEROSCOPY Left 06/03/2014   Procedure: CYSTOSCOPY/RETROGRADE/URETEROSCOPY WITH BIOPSY,STENT EXCHANGE;  Surgeon: [name], MD;  [facility];  Service: Urology;  Laterality: Left;  . ESOPHAGOGASTRODUODENOSCOPY N/A 09/28/2014   Procedure: ESOPHAGOGASTRODUODENOSCOPY (EGD);  Surgeon: Beryle Beams, MD;  Location: [facility];  Service: Endoscopy;  Laterality: N/A;  . GASTRIC ROUX-EN-Y     '74  . HERNIA REPAIR     bilateral hernia repairs   . TONSILLECTOMY    . TOTAL HIP ARTHROPLASTY Left 05/19/2016   Procedure: LEFT TOTAL HIP ARTHROPLASTY ANTERIOR APPROACH;  Surgeon: Mcarthur Rossetti, MD;  Location: [facility];  Service: Orthopedics;  Laterality: Left;  . TRANSURETHRAL RESECTION OF PROSTATE    . VAGOTOMY      History reviewed. No pertinent family history. Social History:  reports that he has never smoked. He has quit using smokeless tobacco.  His smokeless tobacco use included chew. He reports that he drank alcohol. He reports that he does not use drugs.  Allergies: No Known Allergies  No medications prior to admission.    No results found for this or any previous visit (from the past 48 hour(s)). No results found.  Review of Systems  Constitutional: Negative.  Negative for chills and fever.  HENT: Negative.   Eyes: Negative.   Respiratory: Negative.   Cardiovascular: Negative.   Gastrointestinal: Negative.   Genitourinary: Positive for frequency and urgency. Negative for flank pain.  Musculoskeletal: Negative.   Skin: Negative.   Neurological: Negative.   Endo/Heme/Allergies: Negative.   Psychiatric/Behavioral: Negative.     Height  5\' 5"  (1.651 m), weight 90.7 kg. Physical Exam  Constitutional: He appears well-developed.  Stigmata of mild dementia, stable at baseline. AOx3.   HENT:  Head: Normocephalic.  Eyes: Pupils are equal, round, and reactive to light.  Neck: Normal range of motion.  Cardiovascular: Normal rate.  Respiratory: Effort normal.  GI: Soft.  Musculoskeletal: Normal range of motion.  Neurological: He is alert.  Skin: Skin is warm.  Psychiatric: He has a normal mood and affect.     Assessment/Plan  Proceed as planned with LEFT ureteral stent exchange. Risks, benefits, alternative, expected peri-op course discussed previously and reiterated today. He has been through this many times and has great understanding.   [name], MD 04/10/2018, 11:07 AM

## 2018-04-11 ENCOUNTER — Encounter (HOSPITAL_BASED_OUTPATIENT_CLINIC_OR_DEPARTMENT_OTHER): Payer: Self-pay | Admitting: Urology

## 2018-04-16 DIAGNOSIS — H25811 Combined forms of age-related cataract, right eye: Secondary | ICD-10-CM | POA: Diagnosis not present

## 2018-04-16 DIAGNOSIS — D23121 Other benign neoplasm of skin of left upper eyelid, including canthus: Secondary | ICD-10-CM | POA: Diagnosis not present

## 2018-04-16 DIAGNOSIS — H35372 Puckering of macula, left eye: Secondary | ICD-10-CM | POA: Diagnosis not present

## 2018-04-16 DIAGNOSIS — H2512 Age-related nuclear cataract, left eye: Secondary | ICD-10-CM | POA: Diagnosis not present

## 2018-04-18 DIAGNOSIS — F325 Major depressive disorder, single episode, in full remission: Secondary | ICD-10-CM | POA: Diagnosis not present

## 2018-04-18 DIAGNOSIS — Z23 Encounter for immunization: Secondary | ICD-10-CM | POA: Diagnosis not present

## 2018-04-18 DIAGNOSIS — I129 Hypertensive chronic kidney disease with stage 1 through stage 4 chronic kidney disease, or unspecified chronic kidney disease: Secondary | ICD-10-CM | POA: Diagnosis not present

## 2018-04-18 DIAGNOSIS — E538 Deficiency of other specified B group vitamins: Secondary | ICD-10-CM | POA: Diagnosis not present

## 2018-04-18 DIAGNOSIS — N2889 Other specified disorders of kidney and ureter: Secondary | ICD-10-CM | POA: Diagnosis not present

## 2018-04-18 DIAGNOSIS — N401 Enlarged prostate with lower urinary tract symptoms: Secondary | ICD-10-CM | POA: Diagnosis not present

## 2018-04-18 DIAGNOSIS — Z1389 Encounter for screening for other disorder: Secondary | ICD-10-CM | POA: Diagnosis not present

## 2018-04-18 DIAGNOSIS — R2689 Other abnormalities of gait and mobility: Secondary | ICD-10-CM | POA: Diagnosis not present

## 2018-04-18 DIAGNOSIS — N1339 Other hydronephrosis: Secondary | ICD-10-CM | POA: Diagnosis not present

## 2018-04-18 DIAGNOSIS — D692 Other nonthrombocytopenic purpura: Secondary | ICD-10-CM | POA: Diagnosis not present

## 2018-04-18 DIAGNOSIS — I878 Other specified disorders of veins: Secondary | ICD-10-CM | POA: Diagnosis not present

## 2018-04-18 DIAGNOSIS — Z6834 Body mass index (BMI) 34.0-34.9, adult: Secondary | ICD-10-CM | POA: Diagnosis not present

## 2018-04-18 DIAGNOSIS — N183 Chronic kidney disease, stage 3 (moderate): Secondary | ICD-10-CM | POA: Diagnosis not present

## 2018-04-18 DIAGNOSIS — E291 Testicular hypofunction: Secondary | ICD-10-CM | POA: Diagnosis not present

## 2018-06-08 ENCOUNTER — Encounter (HOSPITAL_COMMUNITY): Payer: Self-pay

## 2018-06-08 ENCOUNTER — Emergency Department (HOSPITAL_COMMUNITY): Payer: Medicare Other

## 2018-06-08 ENCOUNTER — Other Ambulatory Visit: Payer: Self-pay

## 2018-06-08 ENCOUNTER — Inpatient Hospital Stay (HOSPITAL_COMMUNITY)
Admission: EM | Admit: 2018-06-08 | Discharge: 2018-06-12 | DRG: 699 | Disposition: A | Discharge status: Skilled Nursing Facility | Payer: Medicare Other | Attending: Family Medicine | Admitting: Family Medicine

## 2018-06-08 DIAGNOSIS — E871 Hypo-osmolality and hyponatremia: Secondary | ICD-10-CM | POA: Diagnosis not present

## 2018-06-08 DIAGNOSIS — I129 Hypertensive chronic kidney disease with stage 1 through stage 4 chronic kidney disease, or unspecified chronic kidney disease: Secondary | ICD-10-CM | POA: Diagnosis present

## 2018-06-08 DIAGNOSIS — Y732 Prosthetic and other implants, materials and accessory gastroenterology and urology devices associated with adverse incidents: Secondary | ICD-10-CM | POA: Diagnosis present

## 2018-06-08 DIAGNOSIS — F419 Anxiety disorder, unspecified: Secondary | ICD-10-CM | POA: Diagnosis present

## 2018-06-08 DIAGNOSIS — B952 Enterococcus as the cause of diseases classified elsewhere: Secondary | ICD-10-CM | POA: Diagnosis present

## 2018-06-08 DIAGNOSIS — Z96642 Presence of left artificial hip joint: Secondary | ICD-10-CM | POA: Diagnosis not present

## 2018-06-08 DIAGNOSIS — N390 Urinary tract infection, site not specified: Secondary | ICD-10-CM | POA: Diagnosis not present

## 2018-06-08 DIAGNOSIS — R110 Nausea: Secondary | ICD-10-CM | POA: Diagnosis not present

## 2018-06-08 DIAGNOSIS — Z8711 Personal history of peptic ulcer disease: Secondary | ICD-10-CM

## 2018-06-08 DIAGNOSIS — J42 Unspecified chronic bronchitis: Secondary | ICD-10-CM | POA: Diagnosis present

## 2018-06-08 DIAGNOSIS — N179 Acute kidney failure, unspecified: Secondary | ICD-10-CM | POA: Diagnosis not present

## 2018-06-08 DIAGNOSIS — I082 Rheumatic disorders of both aortic and tricuspid valves: Secondary | ICD-10-CM | POA: Diagnosis present

## 2018-06-08 DIAGNOSIS — E222 Syndrome of inappropriate secretion of antidiuretic hormone: Secondary | ICD-10-CM | POA: Diagnosis present

## 2018-06-08 DIAGNOSIS — R32 Unspecified urinary incontinence: Secondary | ICD-10-CM | POA: Diagnosis present

## 2018-06-08 DIAGNOSIS — R5381 Other malaise: Secondary | ICD-10-CM | POA: Diagnosis present

## 2018-06-08 DIAGNOSIS — T43225A Adverse effect of selective serotonin reuptake inhibitors, initial encounter: Secondary | ICD-10-CM | POA: Diagnosis present

## 2018-06-08 DIAGNOSIS — D631 Anemia in chronic kidney disease: Secondary | ICD-10-CM | POA: Diagnosis present

## 2018-06-08 DIAGNOSIS — M48061 Spinal stenosis, lumbar region without neurogenic claudication: Secondary | ICD-10-CM | POA: Diagnosis present

## 2018-06-08 DIAGNOSIS — M5432 Sciatica, left side: Secondary | ICD-10-CM | POA: Diagnosis present

## 2018-06-08 DIAGNOSIS — B965 Pseudomonas (aeruginosa) (mallei) (pseudomallei) as the cause of diseases classified elsewhere: Secondary | ICD-10-CM | POA: Diagnosis present

## 2018-06-08 DIAGNOSIS — R52 Pain, unspecified: Secondary | ICD-10-CM | POA: Diagnosis not present

## 2018-06-08 DIAGNOSIS — F329 Major depressive disorder, single episode, unspecified: Secondary | ICD-10-CM | POA: Diagnosis present

## 2018-06-08 DIAGNOSIS — G629 Polyneuropathy, unspecified: Secondary | ICD-10-CM | POA: Diagnosis present

## 2018-06-08 DIAGNOSIS — R278 Other lack of coordination: Secondary | ICD-10-CM | POA: Diagnosis not present

## 2018-06-08 DIAGNOSIS — M25552 Pain in left hip: Secondary | ICD-10-CM | POA: Diagnosis present

## 2018-06-08 DIAGNOSIS — Z471 Aftercare following joint replacement surgery: Secondary | ICD-10-CM | POA: Diagnosis not present

## 2018-06-08 DIAGNOSIS — Z79899 Other long term (current) drug therapy: Secondary | ICD-10-CM

## 2018-06-08 DIAGNOSIS — R062 Wheezing: Secondary | ICD-10-CM

## 2018-06-08 DIAGNOSIS — M79605 Pain in left leg: Secondary | ICD-10-CM | POA: Diagnosis not present

## 2018-06-08 DIAGNOSIS — H9190 Unspecified hearing loss, unspecified ear: Secondary | ICD-10-CM | POA: Diagnosis present

## 2018-06-08 DIAGNOSIS — R112 Nausea with vomiting, unspecified: Secondary | ICD-10-CM

## 2018-06-08 DIAGNOSIS — R2689 Other abnormalities of gait and mobility: Secondary | ICD-10-CM | POA: Diagnosis not present

## 2018-06-08 DIAGNOSIS — N136 Pyonephrosis: Secondary | ICD-10-CM | POA: Diagnosis present

## 2018-06-08 DIAGNOSIS — Z8679 Personal history of other diseases of the circulatory system: Secondary | ICD-10-CM

## 2018-06-08 DIAGNOSIS — M79662 Pain in left lower leg: Secondary | ICD-10-CM | POA: Diagnosis not present

## 2018-06-08 DIAGNOSIS — N10 Acute tubulo-interstitial nephritis: Secondary | ICD-10-CM | POA: Diagnosis not present

## 2018-06-08 DIAGNOSIS — J9809 Other diseases of bronchus, not elsewhere classified: Secondary | ICD-10-CM | POA: Diagnosis not present

## 2018-06-08 DIAGNOSIS — Z87891 Personal history of nicotine dependence: Secondary | ICD-10-CM

## 2018-06-08 DIAGNOSIS — E860 Dehydration: Secondary | ICD-10-CM | POA: Diagnosis present

## 2018-06-08 DIAGNOSIS — Z86010 Personal history of colonic polyps: Secondary | ICD-10-CM

## 2018-06-08 DIAGNOSIS — N183 Chronic kidney disease, stage 3 (moderate): Secondary | ICD-10-CM | POA: Diagnosis present

## 2018-06-08 DIAGNOSIS — R2681 Unsteadiness on feet: Secondary | ICD-10-CM | POA: Diagnosis not present

## 2018-06-08 DIAGNOSIS — Z960 Presence of urogenital implants: Secondary | ICD-10-CM | POA: Diagnosis present

## 2018-06-08 DIAGNOSIS — M6281 Muscle weakness (generalized): Secondary | ICD-10-CM | POA: Diagnosis not present

## 2018-06-08 DIAGNOSIS — Z7982 Long term (current) use of aspirin: Secondary | ICD-10-CM

## 2018-06-08 DIAGNOSIS — M2550 Pain in unspecified joint: Secondary | ICD-10-CM | POA: Diagnosis not present

## 2018-06-08 DIAGNOSIS — T83592A Infection and inflammatory reaction due to indwelling ureteral stent, initial encounter: Principal | ICD-10-CM | POA: Diagnosis present

## 2018-06-08 DIAGNOSIS — Z7401 Bed confinement status: Secondary | ICD-10-CM | POA: Diagnosis not present

## 2018-06-08 DIAGNOSIS — R41841 Cognitive communication deficit: Secondary | ICD-10-CM | POA: Diagnosis not present

## 2018-06-08 DIAGNOSIS — I10 Essential (primary) hypertension: Secondary | ICD-10-CM | POA: Diagnosis not present

## 2018-06-08 HISTORY — DX: Nausea with vomiting, unspecified: R11.2

## 2018-06-08 LAB — URINALYSIS, ROUTINE W REFLEX MICROSCOPIC
Bilirubin Urine: NEGATIVE
Glucose, UA: NEGATIVE mg/dL
Ketones, ur: NEGATIVE mg/dL
NITRITE: NEGATIVE
Protein, ur: 30 mg/dL — AB
Specific Gravity, Urine: 1.008 (ref 1.005–1.030)
pH: 8 (ref 5.0–8.0)

## 2018-06-08 LAB — CBC
HCT: 36 % — ABNORMAL LOW (ref 39.0–52.0)
HEMOGLOBIN: 11.7 g/dL — AB (ref 13.0–17.0)
MCH: 27.3 pg (ref 26.0–34.0)
MCHC: 32.5 g/dL (ref 30.0–36.0)
MCV: 83.9 fL (ref 80.0–100.0)
Platelets: 267 10*3/uL (ref 150–400)
RBC: 4.29 MIL/uL (ref 4.22–5.81)
RDW: 14.2 % (ref 11.5–15.5)
WBC: 8.5 10*3/uL (ref 4.0–10.5)
nRBC: 0 % (ref 0.0–0.2)

## 2018-06-08 LAB — COMPREHENSIVE METABOLIC PANEL
ALBUMIN: 3.4 g/dL — AB (ref 3.5–5.0)
ALT: 11 U/L (ref 0–44)
AST: 25 U/L (ref 15–41)
Alkaline Phosphatase: 95 U/L (ref 38–126)
Anion gap: 10 (ref 5–15)
BUN: 12 mg/dL (ref 8–23)
CHLORIDE: 86 mmol/L — AB (ref 98–111)
CO2: 26 mmol/L (ref 22–32)
Calcium: 8.8 mg/dL — ABNORMAL LOW (ref 8.9–10.3)
Creatinine, Ser: 1.28 mg/dL — ABNORMAL HIGH (ref 0.61–1.24)
GFR calc Af Amer: 57 mL/min — ABNORMAL LOW (ref >60)
GFR calc non Af Amer: 49 mL/min — ABNORMAL LOW (ref >60)
GLUCOSE: 121 mg/dL — AB (ref 70–99)
POTASSIUM: 4 mmol/L (ref 3.5–5.1)
SODIUM: 122 mmol/L — AB (ref 135–145)
Total Bilirubin: 0.5 mg/dL (ref 0.3–1.2)
Total Protein: 7.7 g/dL (ref 6.5–8.1)

## 2018-06-08 LAB — SODIUM, URINE, RANDOM: Sodium, Ur: 17 mmol/L

## 2018-06-08 MED ORDER — FENTANYL CITRATE (PF) 100 MCG/2ML IJ SOLN
100.0000 ug | Freq: Once | INTRAMUSCULAR | Status: AC
  Administered 2018-06-08: 100 ug via INTRAVENOUS
  Filled 2018-06-08: qty 2

## 2018-06-08 MED ORDER — MECLIZINE HCL 25 MG PO TABS: 25.0000 mg | ORAL_TABLET | Freq: Two times a day (BID) | ORAL | Status: DC | PRN

## 2018-06-08 MED ORDER — ONDANSETRON 4 MG PO TBDP
4.0000 mg | ORAL_TABLET | Freq: Once | ORAL | Status: AC
  Administered 2018-06-08: 4 mg via ORAL
  Filled 2018-06-08: qty 1

## 2018-06-08 MED ORDER — IOPAMIDOL (ISOVUE-300) INJECTION 61%
100.0000 mL | Freq: Once | INTRAVENOUS | Status: AC | PRN
  Administered 2018-06-08: 100 mL via INTRAVENOUS

## 2018-06-08 MED ORDER — SODIUM CHLORIDE 0.9 % IV SOLN
INTRAVENOUS | Status: DC
  Administered 2018-06-08 – 2018-06-10 (×4): via INTRAVENOUS

## 2018-06-08 MED ORDER — SODIUM CHLORIDE 0.9 % IV SOLN
1.0000 g | INTRAVENOUS | Status: DC
  Administered 2018-06-08: 1 g via INTRAVENOUS
  Filled 2018-06-08: qty 10
  Filled 2018-06-08: qty 1

## 2018-06-08 MED ORDER — AMLODIPINE BESYLATE 5 MG PO TABS
5.0000 mg | ORAL_TABLET | Freq: Every day | ORAL | Status: DC
  Administered 2018-06-08 – 2018-06-09 (×2): 5 mg via ORAL
  Filled 2018-06-08 (×2): qty 1

## 2018-06-08 MED ORDER — ONDANSETRON HCL 4 MG/2ML IJ SOLN: 4.0000 mg | Freq: Four times a day (QID) | INTRAMUSCULAR | Status: DC | PRN

## 2018-06-08 MED ORDER — GUAIFENESIN ER 600 MG PO TB12
1200.0000 mg | ORAL_TABLET | Freq: Two times a day (BID) | ORAL | Status: DC
  Administered 2018-06-08 – 2018-06-12 (×8): 1200 mg via ORAL
  Filled 2018-06-08 (×8): qty 2

## 2018-06-08 MED ORDER — TRAZODONE HCL 50 MG PO TABS
50.0000 mg | ORAL_TABLET | Freq: Once | ORAL | Status: DC
  Filled 2018-06-08 (×2): qty 1

## 2018-06-08 MED ORDER — ENOXAPARIN SODIUM 40 MG/0.4ML ~~LOC~~ SOLN
40.0000 mg | SUBCUTANEOUS | Status: DC
  Administered 2018-06-08 – 2018-06-11 (×4): 40 mg via SUBCUTANEOUS
  Filled 2018-06-08 (×3): qty 0.4

## 2018-06-08 MED ORDER — ASPIRIN EC 81 MG PO TBEC
81.0000 mg | DELAYED_RELEASE_TABLET | Freq: Every day | ORAL | Status: DC
  Administered 2018-06-08 – 2018-06-12 (×5): 81 mg via ORAL
  Filled 2018-06-08 (×5): qty 1

## 2018-06-08 MED ORDER — CEFTRIAXONE SODIUM 1 G IJ SOLR
1.0000 g | Freq: Once | INTRAMUSCULAR | Status: DC
  Filled 2018-06-08: qty 10

## 2018-06-08 MED ORDER — IOPAMIDOL (ISOVUE-300) INJECTION 61%
INTRAVENOUS | Status: AC
  Filled 2018-06-08: qty 100

## 2018-06-08 MED ORDER — SODIUM CHLORIDE 0.9 % IJ SOLN
INTRAMUSCULAR | Status: AC
  Filled 2018-06-08: qty 50

## 2018-06-08 MED ORDER — OXYCODONE HCL 5 MG PO TABS
5.0000 mg | ORAL_TABLET | Freq: Four times a day (QID) | ORAL | Status: DC | PRN
  Administered 2018-06-08 (×2): 5 mg via ORAL
  Filled 2018-06-08 (×2): qty 1

## 2018-06-08 MED ORDER — LIDOCAINE 5 % EX PTCH
2.0000 | MEDICATED_PATCH | CUTANEOUS | Status: DC
  Administered 2018-06-08: 1 via TRANSDERMAL
  Administered 2018-06-09 – 2018-06-10 (×2): 2 via TRANSDERMAL
  Filled 2018-06-08 (×5): qty 2

## 2018-06-08 NOTE — ED Triage Notes (Signed)
EMS reports from home, called out for increasing left leg pain, x 1 week, and nausea. Hx of left hip arthroplasty, hypertension.  BP 190/96 HR 80 RR 16 Sp02 96 RA

## 2018-06-08 NOTE — ED Notes (Signed)
Bed: WA04 Expected date: 06/08/18 Expected time: 10:32 AM Means of arrival: Ambulance Comments: 84 yo hip pain, chronic

## 2018-06-08 NOTE — H&P (Signed)
History and Physical    Steven Cain ZHY:[id number] DOB: 12/22/1930 DOA: 06/08/2018  PCP: Shon Baton, MD   Patient coming from: home    Chief Complaint: Left hip pain  HPI: Steven Cain is a 84 y.o. male with medical history significant of anxiety, recurrent UTI last UTI in August 2019 after changing a stent followed by urologist, hypertension admitted with nausea vomiting and left hip pain.  Patient reports having a bowel movement yesterday which was brown and normal.  He was not able to keep any liquid or food down yesterday.  Wife reports that patient drinks 4 to 5 L of seltzer water at home to keep himself hydrated to avoid UTIs.  He denies any chest pain shortness of breath cough fever he does report that he has a lot of chills at night.  Patient is a retired Engineer, drilling.  He denies any abdominal pain or urinary complaints at this time.  ED Course: Patient had a CT of the pelvis that showed marked degenerative disc disease and facet arthropathy of the L3-S1 with moderate to marked central canal stenosis L3-L4 3 mm lucency noted about the femoral component of the uncemented left hip arthroplasty loosening is not entirely excluded.  Subchondral cystic change of the left acetabular roof is also identified deep to the acetabular component market osteoarthritis of the right net negative hip left ureteral stent seen.  ED physician spoke to Dr. Ninfa Linden with Ortho recommended plain films of the left hip which he has ordered.  Review of Systems: As per HPI otherwise 10 point review of systems negative.   Past Medical History:  Diagnosis Date  . Anxiety   . AR (aortic regurgitation) 03/23/2017   Mild, Noted on ECHO  . Arthritis    DJD  . Benign enlargement of prostate    frequent UTI, trim of prostate done , with some issues of incontinence now.  . Chronic bronchitis ([place])    chronic- usually has phelgm often  . Depression   . Dyspnea    with excertion  . Fluid retention in legs     occasional use of diuretic as needed  . GI bleed   . Grade I diastolic dysfunction   . Hard of hearing    hear more on left side- no hearing aid  . History of colon polyps   . History of transfusion   . Hydronephrosis 2018   Left  . Hypertension   . Moderate concentric left ventricular hypertrophy 03/23/2017   Noted on ECHO  . Perforated, severe stomach ulcer ([place])   . Peripheral neuropathy   . Spinal stenosis   . TR (tricuspid regurgitation) 03/23/2017   Mild, Noted on ECHO  . Transfusion history    none recent  . Ureteral stricture    CHRONIC  . Urinary incontinence   . UTI (urinary tract infection)    frequent  . Wears glasses     Past Surgical History:  Procedure Laterality Date  . ABDOMINAL SURGERY     bilroths tube '74  . COLONOSCOPY N/A 09/29/2014   Procedure: COLONOSCOPY;  Surgeon: Beryle Beams, MD;  [facility];  Service: Endoscopy;  Laterality: N/A;  . CYSTOSCOPY W/ URETERAL STENT PLACEMENT Left 05/06/2014   Procedure: CYSTOSCOPY WITH RETROGRADE PYELOGRAM/URETERAL STENT PLACEMENT;  Surgeon: [name], MD;  [facility];  Service: Urology;  Laterality: Left;  . CYSTOSCOPY W/ URETERAL STENT PLACEMENT Left 03/31/2015   Procedure: CYSTOSCOPY WITH LEFT RETROGRADE PYELOGRAM/URETERAL STENT EXCHANGE;  Surgeon: Hubbard Robinson  Tresa Moore, MD;  Location: [facility];  Service: Urology;  Laterality: Left;  . CYSTOSCOPY W/ URETERAL STENT PLACEMENT Left 09/15/2015   Procedure: CYSTOSCOPY WITH LEFT RETROGRADE PYELOGRAM/URETERAL STENT EXCHANGE ;  Surgeon: [name], MD;  [facility];  Service: Urology;  Laterality: Left;  . CYSTOSCOPY W/ URETERAL STENT PLACEMENT Left 04/28/2016   Procedure: CYSTOSCOPY WITH RETROGRADE PYELOGRAM/URETERAL STENT EXCHANGE;  Surgeon: [name], MD;  [facility];  Service: Urology;  Laterality: Left;  . CYSTOSCOPY W/ URETERAL STENT PLACEMENT Left 11/08/2016   Procedure: CYSTOSCOPY WITH RETROGRADE PYELOGRAM/URETERAL STENT EXCHANGE;   Surgeon: [name], MD;  [facility];  Service: Urology;  Laterality: Left;  . CYSTOSCOPY W/ URETERAL STENT PLACEMENT Left 09/14/2017   Procedure: CYSTOSCOPY WITH RETROGRADE PYELOGRAM/URETERAL STENT PLACEMENT;  Surgeon: [name], MD;  [facility];  Service: Urology;  Laterality: Left;  . CYSTOSCOPY W/ URETERAL STENT PLACEMENT Left 04/10/2018   Procedure: CYSTOSCOPY WITH LEFT RETROGRADE URETERAL STENT EXCHANGE;  Surgeon: [name], MD;  Location: [facility];  Service: Urology;  Laterality: Left;  . CYSTOSCOPY WITH RETROGRADE PYELOGRAM, URETEROSCOPY AND STENT PLACEMENT Left 10/21/2014   Procedure: CYSTOSCOPY WITH RETROGRADE PYELOGRAM, URETEROSCOPY,BIOPSY AND STENT CHANGE;  Surgeon: [name], MD;  [facility];  Service: Urology;  Laterality: Left;  . CYSTOSCOPY/RETROGRADE/URETEROSCOPY Left 06/03/2014   Procedure: CYSTOSCOPY/RETROGRADE/URETEROSCOPY WITH BIOPSY,STENT EXCHANGE;  Surgeon: [name], MD;  [facility]: [facility];  Service: Urology;  Laterality: Left;  . ESOPHAGOGASTRODUODENOSCOPY N/A 09/28/2014   Procedure: ESOPHAGOGASTRODUODENOSCOPY (EGD);  Surgeon: Beryle Beams, MD;  Location: [facility];  Service: Endoscopy;  Laterality: N/A;  . GASTRIC ROUX-EN-Y     '74  . HERNIA REPAIR     bilateral hernia repairs   . TONSILLECTOMY    . TOTAL HIP ARTHROPLASTY Left 05/19/2016   Procedure: LEFT TOTAL HIP ARTHROPLASTY ANTERIOR APPROACH;  Surgeon: Mcarthur Rossetti, MD;  Location: [facility];  Service: Orthopedics;  Laterality: Left;  . TRANSURETHRAL RESECTION OF PROSTATE    . VAGOTOMY       reports that he has never smoked. He has quit using smokeless tobacco.  His smokeless tobacco use included chew. He reports that he drank alcohol. He reports that he does not use drugs.  No Known Allergies  History reviewed. No pertinent family history.  Prior to Admission medications   Medication Sig Start Date End Date Taking? Authorizing Provider   amLODipine (NORVASC) 5 MG tablet Take 5 mg by mouth daily.  02/17/17  Yes [provider]  Ascorbic Acid (VITAMIN C) 1000 MG tablet Take 1,000 mg by mouth every morning.    Yes [provider]  aspirin EC 81 MG tablet Take 81 mg by mouth daily.   Yes [provider]  b complex vitamins tablet Take 1 tablet by mouth daily.   Yes [provider]  co-enzyme Q-10 30 MG capsule Take 30 mg by mouth as needed.    Yes [provider]  cyanocobalamin (,VITAMIN B-12,) 1000 MCG/ML injection Inject 1,000 mcg into the muscle every 30 (thirty) days.   Yes [provider]  fish oil-omega-3 fatty acids 1000 MG capsule Take 2 g by mouth every morning.    Yes [provider]  guaiFENesin (MUCINEX) 600 MG 12 hr tablet Take 1,200 mg by mouth 2 (two) times daily.    Yes [provider]  MAGNESIUM PO Take 1 tablet by mouth daily.   Yes [provider]  meclizine (ANTIVERT) 25 MG tablet Take 25 mg by  mouth 2 (two) times daily as needed for dizziness.   Yes [provider]  Multiple Vitamin (MULTIVITAMIN WITH MINERALS) TABS tablet Take 1 tablet by mouth every morning.   Yes [provider]  PARoxetine (PAXIL) 20 MG tablet Take 40 mg by mouth 3 (three) times daily. If sleepy only take 1 tablet   Yes [provider]  psyllium (HYDROCIL/METAMUCIL) 95 % PACK Take 1 packet by mouth daily.    Yes [provider]  testosterone cypionate (DEPOTESTOTERONE CYPIONATE) 200 MG/ML injection Inject 60 mg into the muscle every 6 (six) weeks. 3/10 of cc.   Yes [provider]  vitamin E 100 UNIT capsule Take 100 Units by mouth every morning.    Yes [provider]    Physical Exam: Vitals:   06/08/18 1200 06/08/18 1230 06/08/18 1300 06/08/18 1417  BP: (!) 188/82 (!) 173/81 (!) 173/76 (!) 160/73  Pulse: 74 73 74 74  Resp:   16 16  Temp:      TempSrc:      SpO2: 96% 94% 95% 95%  Weight:      Height:         Constitutional: NAD, calm, comfortable Vitals:   06/08/18 1200 06/08/18 1230 06/08/18 1300 06/08/18 1417  BP: (!) 188/82 (!) 173/81 (!) 173/76 (!) 160/73  Pulse: 74 73 74 74  Resp:   16 16  Temp:      TempSrc:      SpO2: 96% 94% 95% 95%  Weight:      Height:       Eyes: PERRL, lids and conjunctivae normal ENMT: Mucous membranes are dry. Posterior pharynx clear of any exudate or lesions.Normal dentition.  Neck: normal, supple, no masses, no thyromegaly Respiratory: clear to auscultation bilaterally, no wheezing, no crackles. Normal respiratory effort. No accessory muscle use.  Cardiovascular: Regular rate and rhythm, no murmurs / rubs / gallops. No extremity edema. 2+ pedal pulses. No carotid bruits.  Abdomen: no tenderness, no masses palpated. No hepatosplenomegaly. Bowel sounds positive.  Musculoskeletal: no edema Skin: no rashes, lesions, ulcers. No induration Neurologic: CN 2-12 grossly intact. Sensation intact, DTR normal. Strength 5/5 in all 4.  Psychiatric: Normal judgment and insight. Alert and oriented x 3. Normal mood.    Labs on Admission: I have personally reviewed following labs and imaging studies  CBC: Recent Labs  Lab 06/08/18 1201  WBC 8.5  HGB 11.7*  HCT 36.0*  MCV 83.9  PLT 270   Basic Metabolic Panel: Recent Labs  Lab 06/08/18 1201  NA 122*  K 4.0  CL 86*  CO2 26  GLUCOSE 121*  BUN 12  CREATININE 1.28*  CALCIUM 8.8*   GFR: Estimated Creatinine Clearance: 46.1 mL/min (A) (by C-G formula based on SCr of 1.28 mg/dL (H)). Liver Function Tests: Recent Labs  Lab 06/08/18 1201  AST 25  ALT 11  ALKPHOS 95  BILITOT 0.5  PROT 7.7  ALBUMIN 3.4*   No results for input(s): LIPASE, AMYLASE in the last 168 hours. No results for input(s): AMMONIA in the last 168 hours. Coagulation Profile: No results for input(s): INR, PROTIME in the last 168 hours. Cardiac Enzymes: No results for input(s): CKTOTAL, CKMB, CKMBINDEX, TROPONINI in the last  168 hours. BNP (last 3 results) No results for input(s): PROBNP in the last 8760 hours. HbA1C: No results for input(s): HGBA1C in the last 72 hours. CBG: No results for input(s): GLUCAP in the last 168 hours. Lipid Profile: No results for input(s): CHOL, HDL,  LDLCALC, TRIG, CHOLHDL, LDLDIRECT in the last 72 hours. Thyroid Function Tests: No results for input(s): TSH, T4TOTAL, FREET4, T3FREE, THYROIDAB in the last 72 hours. Anemia Panel: No results for input(s): VITAMINB12, FOLATE, FERRITIN, TIBC, IRON, RETICCTPCT in the last 72 hours. Urine analysis:    Component Value Date/Time   COLORURINE YELLOW 06/08/2018 1155   APPEARANCEUR HAZY (A) 06/08/2018 1155   LABSPEC 1.008 06/08/2018 1155   PHURINE 8.0 06/08/2018 1155   GLUCOSEU NEGATIVE 06/08/2018 1155   HGBUR SMALL (A) 06/08/2018 1155   BILIRUBINUR NEGATIVE 06/08/2018 1155   KETONESUR NEGATIVE 06/08/2018 1155   PROTEINUR 30 (A) 06/08/2018 1155   UROBILINOGEN 0.2 05/06/2014 1815   NITRITE NEGATIVE 06/08/2018 1155   LEUKOCYTESUR LARGE (A) 06/08/2018 1155    Radiological Exams on Admission: Ct Pelvis W Contrast  Result Date: 06/08/2018 CLINICAL DATA:  Increasing left leg pain x1 week. History of left hip arthroplasty. EXAM: CT PELVIS WITH CONTRAST TECHNIQUE: Multidetector CT imaging of the pelvis was performed using the standard protocol following the bolus administration of intravenous contrast. CONTRAST:  192mL ISOVUE-300 IOPAMIDOL (ISOVUE-300) INJECTION 61% COMPARISON:  None. FINDINGS: Urinary Tract: Partially included left sided ureteral stent is identified without calculus noted along side the stent. The distal coil of the stent is seen within the urinary bladder. No focal mural thickening of the bladder is identified. No calculus is seen. Bowel: No acute bowel obstruction or inflammation. The included distal and terminal ileum are unremarkable. No inflammatory change bowel is identified. Vascular/Lymphatic: No pelvic  lymphadenopathy. Aortoiliac atherosclerosis is noted with ectasia of the common iliac arteries bilaterally measuring up to 1.7 cm on the left and 1.6 cm on the right. Reproductive: The prostate is normal in size with TURP defect identified. Other:  Small periumbilical fat containing hernia. Musculoskeletal: Osteoarthritic joint space narrowing of the native right hip with bone-on-bone apposition of the weight-bearing portion of the femoral head with acetabular roof. Degenerative subchondral cystic change of the acetabular roof and femoral head are identified. Uncemented left hip arthroplasty is noted with subchondral cystic change along the lateral aspect of the acetabulum. Up to 3 mm of lucency noted between the uncemented femoral component and native femur. No acute fractures identified. There is spondylosis of the included lumbar spine with marked disc flattening from L3 through S1 with associated facet arthrosis. Moderate-to-marked central canal stenosis is suggested at L3-4, partially included with ligamentum flavum calcifications in the right facet arthropathy. No suspicious osseous lesions or fracture. The sacral ala appear intact. The sacroiliac joints are maintained. No pelvic diastasis. Mild subcutaneous soft tissue edema overlying the gluteal muscles bilaterally. IMPRESSION: 1. Marked degenerative disc disease and facet arthropathy of the included lower lumbar spine from L3 through S1 with moderate-to-marked central canal stenosis suggested at L3-4, partially included. 2. Up to 3 mm of lucency noted about the femoral component of an uncemented left hip arthroplasty. Loosening is not entirely excluded. Subchondral cystic change of the left acetabular roof is also identified deep to the acetabular component. 3. 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 4. Aortoiliac atherosclerosis with ectasia of the common iliac arteries. 5. Left ureteral stent partially included as above. Electronically Signed   By:  Ashley Royalty M.D.   On: 06/08/2018 14:09   Ct Extremity Lower Left W Contrast  Result Date: 06/08/2018 CLINICAL DATA:  Increasing left leg pain x1 week. EXAM: CT OF THE LOWER LEFT EXTREMITY WITH CONTRAST TECHNIQUE: Multidetector CT imaging of the lower left extremity was performed according to the standard  protocol following intravenous contrast administration. COMPARISON:  CT abdomen pelvis 05/06/2014 CONTRAST:  155mL ISOVUE-300 IOPAMIDOL (ISOVUE-300) INJECTION 61% FINDINGS: Bones/Joint/Cartilage Uncemented left hip arthroplasty with up to 3 mm of lucency between the femoral component and femur. Loosening is not entirely excluded. Correlation is therefore recommended. Chronic stable subchondral cystic change of the left acetabular roof which can be seen on prior CT abdomen and pelvic study from 2015. No acute fracture bone destruction. No periosteal new bone formation. No frank bone destruction. Ligaments Suboptimally assessed by CT. Muscles and Tendons No soft tissue mass.  No muscle atrophy or hematoma. Soft tissues Mild subcutaneous soft tissue induration along the lateral aspect of the thigh and buttock. Left ureteral stent is noted without complicating features. IMPRESSION: 1. No acute osseous abnormality of the left femur. No CT evidence of acute osteomyelitis. 2. 3 mm lucency along the femoral component of a left hip arthroplasty. Loosening is not entirely excluded. 3. Degenerative subchondral cyst of the acetabular roof, pre-existing prior to hip arthroplasty. Electronically Signed   By: Ashley Royalty M.D.   On: 06/08/2018 14:16    Assessment/Plan Active Problems:   Hyponatremia #1 hyponatremia this could be multifactorial including excess water intake at home, secondary to nausea and pain.  However patient's mental status is not altered.  He is able to speak with me have a long conversation.  He is awake alert oriented.  I discussed with Dr. Moshe Cipro with nephrology will place him normal saline  75 cc an hour.  And recheck labs tomorrow.  Please call nephrology in a.m. if needed.  I will also add a urine and serum osmolality.  I will hold his Paxil.  #2 UTI patient with history of ureteral stents and increased WBCs received Rocephin patient has history of frequent UTI follow-up urine culture.  This is probably causing his nausea and vomiting   #3 left hip arthroplasty with increased pain DJD by CT scan Dr. Ninfa Linden is aware recommended a to check plain x-ray of the left hip.  I will order lidocaine patch to the left hip and Ultram for pain control.  #4 hypertension continue Norvasc  #5 AKI possibly secondary to nausea and vomiting.  Follow-up in the morning IV fluids.   DVT prophylaxis: Lovenox Code Status: Full code Family Communication: Discussed with wife Disposition Plan: Pending clinical improvement PT consult ordered Consults called: Discussed with Dr. Moshe Cipro over the phone and ED physician spoke with Dr. Ninfa Linden with Ortho Admission status: Inpatient   Georgette Shell MD [facility] 7PM-7AM, please contact night-coverage [email or web address] Password [email or web address]  06/08/2018, 3:50 PM

## 2018-06-08 NOTE — ED Provider Notes (Signed)
[facility] DEPT Provider Note   CSN: [id number] Arrival date & time: 06/08/18  1028     History   Chief Complaint Chief Complaint  Patient presents with  . Leg Pain    HPI Steven Cain is a 84 y.o. male.  Patient complains of left lateral thigh pain for the past 1 week.  Worse with walking and improved with rest.  He is been treating himself with naproxen.  Last night he vomited one time and had several episodes of dry heaves.  Denies chest pain denies abdominal pain no fever.  No other associated symptoms.  HPI  Past Medical History:  Diagnosis Date  . Anxiety   . AR (aortic regurgitation) 03/23/2017   Mild, Noted on ECHO  . Arthritis    DJD  . Benign enlargement of prostate    frequent UTI, trim of prostate done , with some issues of incontinence now.  . Chronic bronchitis ([place])    chronic- usually has phelgm often  . Depression   . Dyspnea    with excertion  . Fluid retention in legs    occasional use of diuretic as needed  . GI bleed   . Grade I diastolic dysfunction   . Hard of hearing    hear more on left side- no hearing aid  . History of colon polyps   . History of transfusion   . Hydronephrosis 2018   Left  . Hypertension   . Moderate concentric left ventricular hypertrophy 03/23/2017   Noted on ECHO  . Perforated, severe stomach ulcer ([place])   . Peripheral neuropathy   . Spinal stenosis   . TR (tricuspid regurgitation) 03/23/2017   Mild, Noted on ECHO  . Transfusion history    none recent  . Ureteral stricture    CHRONIC  . Urinary incontinence   . UTI (urinary tract infection)    frequent  . Wears glasses     Patient Active Problem List   Diagnosis Date Noted  . Syncope due to orthostatic hypotension 03/23/2017  . Hyponatremia 03/22/2017  . Essential hypertension 03/22/2017  . Osteoarthritis of left hip 05/19/2016  . Status post left hip replacement 05/19/2016  . CKD (chronic kidney disease) stage 3,  GFR 30-59 ml/min (HCC) 09/27/2014  . Anemia associated with acute blood loss 09/27/2014  . Orthostasis 09/27/2014  . GI bleed 09/27/2014  . Acute pyelonephritis 05/08/2014    Class: Acute  . Hydronephrosis of left kidney 05/08/2014    Class: Acute  . Ureteral mass 05/08/2014    Class: Acute  . UTI (urinary tract infection) 04/24/2013  . Chills 04/24/2013    Past Surgical History:  Procedure Laterality Date  . ABDOMINAL SURGERY     bilroths tube '74  . COLONOSCOPY N/A 09/29/2014   Procedure: COLONOSCOPY;  Surgeon: Beryle Beams, MD;  Location: [facility];  Service: Endoscopy;  Laterality: N/A;  . CYSTOSCOPY W/ URETERAL STENT PLACEMENT Left 05/06/2014   Procedure: CYSTOSCOPY WITH RETROGRADE PYELOGRAM/URETERAL STENT PLACEMENT;  Surgeon: [name], MD;  Location: [facility];  Service: Urology;  Laterality: Left;  . CYSTOSCOPY W/ URETERAL STENT PLACEMENT Left 03/31/2015   Procedure: CYSTOSCOPY WITH LEFT RETROGRADE PYELOGRAM/URETERAL STENT EXCHANGE;  Surgeon: [name], MD;  Location: [facility];  Service: Urology;  Laterality: Left;  . CYSTOSCOPY W/ URETERAL STENT PLACEMENT Left 09/15/2015   Procedure: CYSTOSCOPY WITH LEFT RETROGRADE PYELOGRAM/URETERAL STENT EXCHANGE ;  Surgeon: [name], MD;  Location: [facility];  Service: Urology;  Laterality: Left;  .  CYSTOSCOPY W/ URETERAL STENT PLACEMENT Left 04/28/2016   Procedure: CYSTOSCOPY WITH RETROGRADE PYELOGRAM/URETERAL STENT EXCHANGE;  Surgeon: [name], MD;  Location: [facility];  Service: Urology;  Laterality: Left;  . CYSTOSCOPY W/ URETERAL STENT PLACEMENT Left 11/08/2016   Procedure: CYSTOSCOPY WITH RETROGRADE PYELOGRAM/URETERAL STENT EXCHANGE;  Surgeon: [name], MD;  Location: [facility];  Service: Urology;  Laterality: Left;  . CYSTOSCOPY W/ URETERAL STENT PLACEMENT Left 09/14/2017   Procedure: CYSTOSCOPY WITH RETROGRADE PYELOGRAM/URETERAL STENT PLACEMENT;  Surgeon: [name], MD;  Location: [facility];  Service: Urology;   Laterality: Left;  . CYSTOSCOPY W/ URETERAL STENT PLACEMENT Left 04/10/2018   Procedure: CYSTOSCOPY WITH LEFT RETROGRADE URETERAL STENT EXCHANGE;  Surgeon: [name], MD;  Location: [facility];  Service: Urology;  Laterality: Left;  . CYSTOSCOPY WITH RETROGRADE PYELOGRAM, URETEROSCOPY AND STENT PLACEMENT Left 10/21/2014   Procedure: CYSTOSCOPY WITH RETROGRADE PYELOGRAM, URETEROSCOPY,BIOPSY AND STENT CHANGE;  Surgeon: [name], MD;  Location: [facility];  Service: Urology;  Laterality: Left;  . CYSTOSCOPY/RETROGRADE/URETEROSCOPY Left 06/03/2014   Procedure: CYSTOSCOPY/RETROGRADE/URETEROSCOPY WITH BIOPSY,STENT EXCHANGE;  Surgeon: [name], MD;  Location: [facility];  Service: Urology;  Laterality: Left;  . ESOPHAGOGASTRODUODENOSCOPY N/A 09/28/2014   Procedure: ESOPHAGOGASTRODUODENOSCOPY (EGD);  Surgeon: Beryle Beams, MD;  Location: [facility];  Service: Endoscopy;  Laterality: N/A;  . GASTRIC ROUX-EN-Y     '74  . HERNIA REPAIR     bilateral hernia repairs   . TONSILLECTOMY    . TOTAL HIP ARTHROPLASTY Left 05/19/2016   Procedure: LEFT TOTAL HIP ARTHROPLASTY ANTERIOR APPROACH;  Surgeon: Mcarthur Rossetti, MD;  Location: [facility];  Service: Orthopedics;  Laterality: Left;  . TRANSURETHRAL RESECTION OF PROSTATE    . VAGOTOMY          Home Medications    Prior to Admission medications   Medication Sig Start Date End Date Taking? Authorizing Provider  amLODipine (NORVASC) 5 MG tablet Take 5 mg by mouth daily.  02/17/17  Yes [provider]  Ascorbic Acid (VITAMIN C) 1000 MG tablet Take 1,000 mg by mouth every morning.    Yes [provider]  aspirin EC 81 MG tablet Take 81 mg by mouth daily.   Yes [provider]  b complex vitamins tablet Take 1 tablet by mouth daily.   Yes [provider]  co-enzyme Q-10 30 MG capsule Take 30 mg by mouth as needed.    Yes [provider]  cyanocobalamin (,VITAMIN B-12,) 1000 MCG/ML  injection Inject 1,000 mcg into the muscle every 30 (thirty) days.   Yes [provider]  fish oil-omega-3 fatty acids 1000 MG capsule Take 2 g by mouth every morning.    Yes [provider]  guaiFENesin (MUCINEX) 600 MG 12 hr tablet Take 1,200 mg by mouth 2 (two) times daily.    Yes [provider]  MAGNESIUM PO Take 1 tablet by mouth daily.   Yes [provider]  meclizine (ANTIVERT) 25 MG tablet Take 25 mg by mouth 2 (two) times daily as needed for dizziness.   Yes [provider]  Multiple Vitamin (MULTIVITAMIN WITH MINERALS) TABS tablet Take 1 tablet by mouth every morning.   Yes [provider]  PARoxetine (PAXIL) 20 MG tablet Take 40 mg by mouth 3 (three) times daily. If sleepy only take 1 tablet   Yes [provider]  psyllium (HYDROCIL/METAMUCIL) 95 % PACK Take 1 packet by mouth daily.    Yes [provider]  testosterone cypionate (DEPOTESTOTERONE  CYPIONATE) 200 MG/ML injection Inject 60 mg into the muscle every 6 (six) weeks. 3/10 of cc.   Yes [provider]  vitamin E 100 UNIT capsule Take 100 Units by mouth every morning.    Yes [provider]    Family History History reviewed. No pertinent family history.  Social History Social History   Tobacco Use  . Smoking status: Never Smoker  . Smokeless tobacco: Former Systems developer    Types: Chew  Substance Use Topics  . Alcohol use: Not Currently  . Drug use: No     Allergies   Patient has no known allergies.   Review of Systems Review of Systems  Gastrointestinal: Positive for vomiting.  Musculoskeletal: Positive for arthralgias and gait problem.       Walking with walker past week. hip pain  All other systems reviewed and are negative.    Physical Exam Updated Vital Signs BP (!) 205/90 (BP Location: Left Arm)   Pulse 78   Temp 98.1 F (36.7 C) (Oral)   Resp 16   Ht 5\' 8"  (1.727 m)   Wt 90.7 kg   SpO2 96%   BMI 30.41 kg/m    Physical Exam  Constitutional:  Chronically ill-appearing  HENT:  Head: Normocephalic and atraumatic.  Eyes: Pupils are equal, round, and reactive to light. Conjunctivae are normal.  Neck: Neck supple. No tracheal deviation present. No thyromegaly present.  Cardiovascular: Normal rate and regular rhythm.  No murmur heard. Pulmonary/Chest: Effort normal and breath sounds normal.  Abdominal: Soft. Bowel sounds are normal. He exhibits no distension. There is no tenderness.  Musculoskeletal: Normal range of motion. He exhibits no edema or tenderness.  Left lower extremity mildly tender at lateral thigh.  No swelling or redness.  DP pulse 2+.  He has no pain on internal or external rotation of thigh.  All other extremities no redness swelling or tenderness neurovascularly intact  Neurological: He is alert. Coordination normal.  Skin: Skin is warm and dry. No rash noted.  Psychiatric: He has a normal mood and affect.  Nursing note and vitals reviewed.   Declines pain medicine.   At 2:50 PM patient requesting pain medicine.  IV fentanyl ordered. Results for orders placed or performed during the hospital encounter of 06/08/18  Comprehensive metabolic panel  Result Value Ref Range   Sodium 122 (L) 135 - 145 mmol/L   Potassium 4.0 3.5 - 5.1 mmol/L   Chloride 86 (L) 98 - 111 mmol/L   CO2 26 22 - 32 mmol/L   Glucose, Bld 121 (H) 70 - 99 mg/dL   BUN 12 8 - 23 mg/dL   Creatinine, Ser 1.28 (H) 0.61 - 1.24 mg/dL   Calcium 8.8 (L) 8.9 - 10.3 mg/dL   Total Protein 7.7 6.5 - 8.1 g/dL   Albumin 3.4 (L) 3.5 - 5.0 g/dL   AST 25 15 - 41 U/L   ALT 11 0 - 44 U/L   Alkaline Phosphatase 95 38 - 126 U/L   Total Bilirubin 0.5 0.3 - 1.2 mg/dL   GFR calc non Af Amer 49 (L) >60 mL/min   GFR calc Af Amer 57 (L) >60 mL/min   Anion gap 10 5 - 15  CBC  Result Value Ref Range   WBC 8.5 4.0 - 10.5 K/uL   RBC 4.29 4.22 - 5.81 MIL/uL   Hemoglobin 11.7 (L) 13.0 - 17.0 g/dL   HCT 36.0 (L) 39.0 - 52.0 %    MCV 83.9 80.0 - 100.0  fL   MCH 27.3 26.0 - 34.0 pg   MCHC 32.5 30.0 - 36.0 g/dL   RDW 14.2 11.5 - 15.5 %   Platelets 267 150 - 400 K/uL   nRBC 0.0 0.0 - 0.2 %  Urinalysis, Routine w reflex microscopic  Result Value Ref Range   Color, Urine YELLOW YELLOW   APPearance HAZY (A) CLEAR   Specific Gravity, Urine 1.008 1.005 - 1.030   pH 8.0 5.0 - 8.0   Glucose, UA NEGATIVE NEGATIVE mg/dL   Hgb urine dipstick SMALL (A) NEGATIVE   Bilirubin Urine NEGATIVE NEGATIVE   Ketones, ur NEGATIVE NEGATIVE mg/dL   Protein, ur 30 (A) NEGATIVE mg/dL   Nitrite NEGATIVE NEGATIVE   Leukocytes, UA LARGE (A) NEGATIVE   RBC / HPF 11-20 0 - 5 RBC/hpf   WBC, UA 21-50 0 - 5 WBC/hpf   Bacteria, UA MANY (A) NONE SEEN   Squamous Epithelial / LPF 0-5 0 - 5   Mucus PRESENT    Ct Pelvis W Contrast  Result Date: 06/08/2018 CLINICAL DATA:  Increasing left leg pain x1 week. History of left hip arthroplasty. EXAM: CT PELVIS WITH CONTRAST TECHNIQUE: Multidetector CT imaging of the pelvis was performed using the standard protocol following the bolus administration of intravenous contrast. CONTRAST:  115mL ISOVUE-300 IOPAMIDOL (ISOVUE-300) INJECTION 61% COMPARISON:  None. FINDINGS: Urinary Tract: Partially included left sided ureteral stent is identified without calculus noted along side the stent. The distal coil of the stent is seen within the urinary bladder. No focal mural thickening of the bladder is identified. No calculus is seen. Bowel: No acute bowel obstruction or inflammation. The included distal and terminal ileum are unremarkable. No inflammatory change bowel is identified. Vascular/Lymphatic: No pelvic lymphadenopathy. Aortoiliac atherosclerosis is noted with ectasia of the common iliac arteries bilaterally measuring up to 1.7 cm on the left and 1.6 cm on the right. Reproductive: The prostate is normal in size with TURP defect identified. Other:  Small periumbilical fat containing hernia. Musculoskeletal:  Osteoarthritic joint space narrowing of the native right hip with bone-on-bone apposition of the weight-bearing portion of the femoral head with acetabular roof. Degenerative subchondral cystic change of the acetabular roof and femoral head are identified. Uncemented left hip arthroplasty is noted with subchondral cystic change along the lateral aspect of the acetabulum. Up to 3 mm of lucency noted between the uncemented femoral component and native femur. No acute fractures identified. There is spondylosis of the included lumbar spine with marked disc flattening from L3 through S1 with associated facet arthrosis. Moderate-to-marked central canal stenosis is suggested at L3-4, partially included with ligamentum flavum calcifications in the right facet arthropathy. No suspicious osseous lesions or fracture. The sacral ala appear intact. The sacroiliac joints are maintained. No pelvic diastasis. Mild subcutaneous soft tissue edema overlying the gluteal muscles bilaterally. IMPRESSION: 1. Marked degenerative disc disease and facet arthropathy of the included lower lumbar spine from L3 through S1 with moderate-to-marked central canal stenosis suggested at L3-4, partially included. 2. Up to 3 mm of lucency noted about the femoral component of an uncemented left hip arthroplasty. Loosening is not entirely excluded. Subchondral cystic change of the left acetabular roof is also identified deep to the acetabular component. 3. 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 4. Aortoiliac atherosclerosis with ectasia of the common iliac arteries. 5. Left ureteral stent partially included as above. Electronically Signed   By: Ashley Royalty M.D.   On: 06/08/2018 14:09   Ct Extremity Lower Left W Contrast  Result Date: 06/08/2018 CLINICAL DATA:  Increasing left leg pain x1 week. EXAM: CT OF THE LOWER LEFT EXTREMITY WITH CONTRAST TECHNIQUE: Multidetector CT imaging of the lower left extremity was performed according to the  standard protocol following intravenous contrast administration. COMPARISON:  CT abdomen pelvis 05/06/2014 CONTRAST:  137mL ISOVUE-300 IOPAMIDOL (ISOVUE-300) INJECTION 61% FINDINGS: Bones/Joint/Cartilage Uncemented left hip arthroplasty with up to 3 mm of lucency between the femoral component and femur. Loosening is not entirely excluded. Correlation is therefore recommended. Chronic stable subchondral cystic change of the left acetabular roof which can be seen on prior CT abdomen and pelvic study from 2015. No acute fracture bone destruction. No periosteal new bone formation. No frank bone destruction. Ligaments Suboptimally assessed by CT. Muscles and Tendons No soft tissue mass.  No muscle atrophy or hematoma. Soft tissues Mild subcutaneous soft tissue induration along the lateral aspect of the thigh and buttock. Left ureteral stent is noted without complicating features. IMPRESSION: 1. No acute osseous abnormality of the left femur. No CT evidence of acute osteomyelitis. 2. 3 mm lucency along the femoral component of a left hip arthroplasty. Loosening is not entirely excluded. 3. Degenerative subchondral cyst of the acetabular roof, pre-existing prior to hip arthroplasty. Electronically Signed   By: Ashley Royalty M.D.   On: 06/08/2018 14:16     I consulted Dr. Jimmye Norman from hospitalist service who will arrange for hospital service, who will arrange for admission.  Orthopedics, Dr. Ninfa Linden also consulted.  He will see patient while in hospital.  He is requesting plain x-rays of left hip which have ordered. family requesting consult from urology..  Message passed on to hospitalist.  Intravenous antibiotics ordered for me for urinary tract infection.  Lab work also consistent with hyponatremia and renal insufficiency which is chronic.  Intravenous normal saline drip ordered in order to correct hyponatremia. ED Treatments / Results  Labs (all labs ordered are listed, but only abnormal results are  displayed) Labs Reviewed  COMPREHENSIVE METABOLIC PANEL  CBC  URINALYSIS, ROUTINE W REFLEX MICROSCOPIC    EKG None  Radiology No results found.  Procedures Procedures (including critical care time)  Medications Ordered in ED Medications  ondansetron (ZOFRAN-ODT) disintegrating tablet 4 mg (4 mg Oral Given 06/08/18 1051)     Initial Impression / Assessment and Plan / ED Course  I have reviewed the triage vital signs and the nursing notes.  Pertinent labs & imaging results that were available during my care of the patient were reviewed by me and considered in my medical decision making (see chart for details).    Work remarkable for hyponatremia chronic renal insufficiency and urinary tract infection  Final Clinical Impressions(s) / ED Diagnoses  Diagnoses #1 hyponatremia #2 urinary tract infection #3 left lower extremity pain Final diagnoses:  None    ED Discharge Orders    None       Orlie Dakin, MD 06/08/18 1533

## 2018-06-09 ENCOUNTER — Inpatient Hospital Stay (HOSPITAL_COMMUNITY): Payer: Medicare Other

## 2018-06-09 DIAGNOSIS — M25552 Pain in left hip: Secondary | ICD-10-CM

## 2018-06-09 LAB — BASIC METABOLIC PANEL
ANION GAP: 8 (ref 5–15)
BUN: 15 mg/dL (ref 8–23)
CHLORIDE: 98 mmol/L (ref 98–111)
CO2: 27 mmol/L (ref 22–32)
Calcium: 8.7 mg/dL — ABNORMAL LOW (ref 8.9–10.3)
Creatinine, Ser: 1.59 mg/dL — ABNORMAL HIGH (ref 0.61–1.24)
GFR calc Af Amer: 44 mL/min — ABNORMAL LOW (ref >60)
GFR, EST NON AFRICAN AMERICAN: 38 mL/min — AB (ref >60)
Glucose, Bld: 97 mg/dL (ref 70–99)
POTASSIUM: 4.5 mmol/L (ref 3.5–5.1)
SODIUM: 133 mmol/L — AB (ref 135–145)

## 2018-06-09 LAB — OSMOLALITY: Osmolality: 267 mOsm/kg — ABNORMAL LOW (ref 275–295)

## 2018-06-09 LAB — CBC
HCT: 37.4 % — ABNORMAL LOW (ref 39.0–52.0)
HEMOGLOBIN: 11.9 g/dL — AB (ref 13.0–17.0)
MCH: 27.8 pg (ref 26.0–34.0)
MCHC: 31.8 g/dL (ref 30.0–36.0)
MCV: 87.4 fL (ref 80.0–100.0)
Platelets: 263 10*3/uL (ref 150–400)
RBC: 4.28 MIL/uL (ref 4.22–5.81)
RDW: 14.7 % (ref 11.5–15.5)
WBC: 8 10*3/uL (ref 4.0–10.5)
nRBC: 0 % (ref 0.0–0.2)

## 2018-06-09 LAB — OSMOLALITY, URINE: OSMOLALITY UR: 88 mosm/kg — AB (ref 300–900)

## 2018-06-09 LAB — C-REACTIVE PROTEIN: CRP: 5.3 mg/dL — ABNORMAL HIGH (ref <1.0)

## 2018-06-09 LAB — SEDIMENTATION RATE: Sed Rate: 50 mm/hr — ABNORMAL HIGH (ref 0–16)

## 2018-06-09 MED ORDER — SODIUM CHLORIDE 0.9 % IV SOLN
1.0000 g | INTRAVENOUS | Status: DC
  Administered 2018-06-09 – 2018-06-11 (×3): 1 g via INTRAVENOUS
  Filled 2018-06-09 (×3): qty 1

## 2018-06-09 MED ORDER — SENNOSIDES-DOCUSATE SODIUM 8.6-50 MG PO TABS
2.0000 | ORAL_TABLET | Freq: Once | ORAL | Status: AC
  Administered 2018-06-09: 2 via ORAL
  Filled 2018-06-09: qty 2

## 2018-06-09 MED ORDER — OXYCODONE HCL 5 MG PO TABS
5.0000 mg | ORAL_TABLET | ORAL | Status: DC | PRN
  Administered 2018-06-09 (×3): 5 mg via ORAL
  Filled 2018-06-09 (×4): qty 1

## 2018-06-09 MED ORDER — PAROXETINE HCL 20 MG PO TABS
40.0000 mg | ORAL_TABLET | Freq: Every day | ORAL | Status: DC
  Administered 2018-06-09 – 2018-06-12 (×4): 40 mg via ORAL
  Filled 2018-06-09 (×4): qty 2

## 2018-06-09 MED ORDER — PAROXETINE HCL 20 MG PO TABS: 40.0000 mg | ORAL_TABLET | Freq: Three times a day (TID) | ORAL | Status: DC

## 2018-06-09 NOTE — Progress Notes (Signed)
PROGRESS NOTE  Steven Cain BDZ:[id number] DOB: 27-Aug-1930 DOA: 06/08/2018 PCP: Shon Baton, MD  HPI/Recap of past 24 hours: Steven Cain is a 84 y.o. male with medical history significant of anxiety, recurrent UTI last UTI in August 2019 after changing a stent followed by urologist, hypertension admitted with nausea vomiting and left hip pain.  Patient reports having a bowel movement yesterday which was brown and normal.  He was not able to keep any liquid or food down yesterday.  Wife reports that patient drinks 4 to 5 L of seltzer water at home to keep himself hydrated to avoid UTIs.  He denies any chest pain shortness of breath cough fever he does report that he has a lot of chills at night.  Patient is a retired Engineer, drilling.  He denies any abdominal pain or urinary complaints at this time.  06/09/2018: Patient seen and examined with his wife at his bedside.  No acute events overnight.  Alert and oriented x3.  No pain in his left hip at rest however with minimal weight bearing he experiences significant pain.  Orthopedic surgery following.  MRI ordered to further assess.  Assessment/Plan: Active Problems:   Hyponatremia   Acute lower UTI   Nausea & vomiting   Pain in left hip  Hyponatremia suspect multifactorial secondary to excessive water intake at home versus SSRI Sodium level much improved from 122 to 133 this morning on gentle IV isotonic fluid Continue to monitor sodium level and repeat BMP in the morning  AKI on CKD 3 Baseline creatinine 1.2 with GFR of 49 Creatinine 1.59 with GFR of 38 today Continue gentle IV fluid hydration with normal saline at 75 cc/h Monitor urine output Repeat creatinine level in the morning Avoid nephrotoxic agents  UTI Urine culture grew 80,000 Pseudomonas Recultured Rocephin changed to IV cefepime  Mild normocytic anemia Baseline hemoglobin appears to be 12 No sign of overt bleeding Continue to monitor  Chronic depression Resume  Paxil Decreased dose from 40 3 times daily to 40 daily Monitor sodium level  Intractable left hip pain post arthroplasty DJD noted on CT scan Orthopedic surgery following MRI left hip and lumbar spine without contrast ordered to further assess IV fentanyl as needed for severe pain and lidocaine patch in place Start bowel regimen Senokot 2 tablet twice daily   Code Status: Full code  Family Communication: Wife at bedside.  All questions answered to her satisfaction.  Also daughter on the phone.  Disposition Plan: Home possibly in 1 to 2 days when hemodynamically stable and renal function has improved.   Consultants:  Orthopedic surgery  Procedures:  None  Antimicrobials:  Rocephin  DVT prophylaxis: Subcu Lovenox daily   Objective: Vitals:   06/08/18 1627 06/08/18 1710 06/08/18 2041 06/09/18 0441  BP: (!) 158/71 (!) 206/80 132/71 128/82  Pulse: 77 80 79 64  Resp: 16 18 18 14   Temp:  97.7 F (36.5 C) 98.5 F (36.9 C) 98.4 F (36.9 C)  TempSrc:  Oral Oral Oral  SpO2: 96% 98% 99% 100%  Weight:      Height:        Intake/Output Summary (Last 24 hours) at 06/09/2018 1357 Last data filed at 06/09/2018 1200 Gross per 24 hour  Intake 1562.74 ml  Output 2700 ml  Net -1137.26 ml   Filed Weights   06/08/18 1043  Weight: 90.7 kg    Exam:  . General: 84 y.o. year-old male well developed well nourished in no acute distress.  Alert and oriented x3. . Cardiovascular: Regular rate and rhythm with no rubs or gallops.  No thyromegaly or JVD noted.   Marland Kitchen Respiratory: Clear to auscultation with no wheezes or rales. Good inspiratory effort. . Abdomen: Soft nontender nondistended with normal bowel sounds x4 quadrants. . Musculoskeletal: Trace lower extremity edema. 2/4 pulses in all 4 extremities. . Skin: No ulcerative lesions; petechial rash noted in left thigh. . Psychiatry: Mood is appropriate for condition and setting   Data Reviewed: CBC: Recent Labs  Lab  06/08/18 1201 06/09/18 0513  WBC 8.5 8.0  HGB 11.7* 11.9*  HCT 36.0* 37.4*  MCV 83.9 87.4  PLT 267 532   Basic Metabolic Panel: Recent Labs  Lab 06/08/18 1201 06/09/18 0513  NA 122* 133*  K 4.0 4.5  CL 86* 98  CO2 26 27  GLUCOSE 121* 97  BUN 12 15  CREATININE 1.28* 1.59*  CALCIUM 8.8* 8.7*   GFR: Estimated Creatinine Clearance: 37.1 mL/min (A) (by C-G formula based on SCr of 1.59 mg/dL (H)). Liver Function Tests: Recent Labs  Lab 06/08/18 1201  AST 25  ALT 11  ALKPHOS 95  BILITOT 0.5  PROT 7.7  ALBUMIN 3.4*   No results for input(s): LIPASE, AMYLASE in the last 168 hours. No results for input(s): AMMONIA in the last 168 hours. Coagulation Profile: No results for input(s): INR, PROTIME in the last 168 hours. Cardiac Enzymes: No results for input(s): CKTOTAL, CKMB, CKMBINDEX, TROPONINI in the last 168 hours. BNP (last 3 results) No results for input(s): PROBNP in the last 8760 hours. HbA1C: No results for input(s): HGBA1C in the last 72 hours. CBG: No results for input(s): GLUCAP in the last 168 hours. Lipid Profile: No results for input(s): CHOL, HDL, LDLCALC, TRIG, CHOLHDL, LDLDIRECT in the last 72 hours. Thyroid Function Tests: No results for input(s): TSH, T4TOTAL, FREET4, T3FREE, THYROIDAB in the last 72 hours. Anemia Panel: No results for input(s): VITAMINB12, FOLATE, FERRITIN, TIBC, IRON, RETICCTPCT in the last 72 hours. Urine analysis:    Component Value Date/Time   COLORURINE YELLOW 06/08/2018 1155   APPEARANCEUR HAZY (A) 06/08/2018 1155   LABSPEC 1.008 06/08/2018 1155   PHURINE 8.0 06/08/2018 1155   GLUCOSEU NEGATIVE 06/08/2018 1155   HGBUR SMALL (A) 06/08/2018 1155   BILIRUBINUR NEGATIVE 06/08/2018 1155   KETONESUR NEGATIVE 06/08/2018 1155   PROTEINUR 30 (A) 06/08/2018 1155   UROBILINOGEN 0.2 05/06/2014 1815   NITRITE NEGATIVE 06/08/2018 1155   LEUKOCYTESUR LARGE (A) 06/08/2018 1155   Sepsis  Labs: @LABRCNTIP (procalcitonin:4,lacticidven:4)  ) Recent Results (from the past 240 hour(s))  Urine Culture     Status: Abnormal (Preliminary result)   Collection Time: 06/08/18  2:54 PM  Result Value Ref Range Status   Specimen Description   Final    URINE, RANDOM Performed at [facility] [address] [place], [place] [address]    Special Requests   Final    Normal Performed at [facility], [place] [address], [place], [place] [address]    Culture (A)  Final    80,000 COLONIES/mL PSEUDOMONAS AERUGINOSA CULTURE REINCUBATED FOR BETTER GROWTH Performed at [facility], [place] [address], [place], [place] [address]    Report Status PENDING  Incomplete      Studies: Dg Hip Unilat W Or Wo Pelvis 2-3 Views Left  Result Date: 06/08/2018 CLINICAL DATA:  Pt from home and called EMS for increasing left leg pain x 1 week, and nausea. Hx of left hip arthroplasty.  EXAM: DG HIP (WITH OR WITHOUT PELVIS) 2-3V LEFT COMPARISON:  05/19/2016 FINDINGS: No fracture.  No bone lesion. Left hip total arthroplasty appears well seated and aligned. No evidence of loosening. There are advanced arthropathic changes of the right hip. SI joints and symphysis pubis are normally aligned. Degenerative changes are noted of the visualized lower lumbar spine. Bones are demineralized. Partly imaged left ureteral stent. IMPRESSION: 1. No fracture, bone lesion or evidence of malalignment/loosening of the left total hip arthroplasty. No acute finding. Electronically Signed   By: Lajean Manes M.D.   On: 06/08/2018 16:18    Scheduled Meds: . amLODipine  5 mg Oral Daily  . aspirin EC  81 mg Oral Daily  . enoxaparin (LOVENOX) injection  40 mg Subcutaneous Q24H  . guaiFENesin  1,200 mg Oral BID  . lidocaine  2 patch Transdermal Q24H  . traZODone  50 mg Oral Once    Continuous Infusions: . sodium chloride 75 mL/hr at 06/09/18 1200  . cefTRIAXone (ROCEPHIN)  IV Stopped  (06/08/18 2008)     LOS: 1 day     Kayleen Memos, MD [facility] Pager [phone]  If 7PM-7AM, please contact night-coverage [email or web address] Password [email or web address] 06/09/2018, 1:57 PM

## 2018-06-09 NOTE — Evaluation (Signed)
Physical Therapy Evaluation Patient Details Name: Steven Cain MRN: [id number] DOB: 1931/07/08 Today's Date: 06/09/2018   History of Present Illness  Steven Cain is a 84 y.o. male with medical history significant of anxiety, recurrent UTI last UTI in August 2019 after changing a stent followed by urologist, hypertension admitted with nausea vomiting and left hip pain.   Clinical Impression  Pt admitted with above diagnosis. Pt currently with functional limitations due to the deficits listed below (see PT Problem List).  Pt will benefit from skilled PT to increase their independence and safety with mobility to allow discharge to the venue listed below.  Pt limited by pain at PT eval.  Pt is scheduled for MRI tomorrow.  At this time, recommend SNF, but will continue to assess as he progresses and medical issues are determined.  His wife works long shifts and pt would be at home alone during the day.     Follow Up Recommendations SNF    Equipment Recommendations  None recommended by PT    Recommendations for Other Services       Precautions / Restrictions Precautions Precautions: Fall Restrictions Weight Bearing Restrictions: Yes LLE Weight Bearing: Weight bearing as tolerated(per Dr. Trevor Mace note 06/09/18)      Mobility  Bed Mobility Overal bed mobility: Needs Assistance Bed Mobility: Rolling Rolling: Max assist         General bed mobility comments: Pt required MAX A for rolling due to pain.  He was able to pull self up in bed and bridge with R LE while keeping L LE in extension.  Transfers                 General transfer comment: deferred  Ambulation/Gait                Stairs            Wheelchair Mobility    Modified Rankin (Stroke Patients Only)       Balance                                             Pertinent Vitals/Pain Pain Assessment: 0-10 Pain Score: 7  Pain Location: L hip Pain Descriptors /  Indicators: Radiating Pain Intervention(s): Repositioned;Limited activity within patient's tolerance;Monitored during session    [place] expects to be discharged to:: Private residence Living Arrangements: Spouse/significant other Available Help at Discharge: Family;Available PRN/intermittently(wife works 14 hours/day) Type of Home: House Home Access: Stairs to enter Entrance Stairs-Rails: Right Entrance Stairs-Number of Steps: 3 Home Layout: One level Home Equipment: Environmental consultant - 4 wheels;Cane - single point;Wheelchair - manual      Prior Function Level of Independence: Needs assistance   Gait / Transfers Assistance Needed: Ambulates short distances with cane and hold onto the wall, but has had to start using rollator the last week.  He has assistance of +2 on stairs. W/c for community distances.           Hand Dominance        Extremity/Trunk Assessment   Upper Extremity Assessment Upper Extremity Assessment: LUE deficits/detail LUE Deficits / Details: decreased ROM    Lower Extremity Assessment Lower Extremity Assessment: LLE deficits/detail LLE: Unable to fully assess due to pain       Communication   Communication: No difficulties  Cognition Arousal/Alertness: Awake/alert Behavior During Therapy: WFL for tasks  assessed/performed Overall Cognitive Status: Within Functional Limits for tasks assessed                                 General Comments: Cracking jokes throughout session.      General Comments      Exercises     Assessment/Plan    PT Assessment Patient needs continued PT services  PT Problem List Decreased strength;Decreased range of motion;Pain;Decreased activity tolerance;Decreased mobility       PT Treatment Interventions DME instruction;Gait training;Functional mobility training;Therapeutic activities;Therapeutic exercise;Balance training    PT Goals (Current goals can be found in the Care Plan section)   Acute Rehab PT Goals Patient Stated Goal: decrease pain PT Goal Formulation: With patient/family Time For Goal Achievement: 06/23/18 Potential to Achieve Goals: Good    Frequency Min 2X/week   Barriers to discharge        Co-evaluation               AM-PAC PT "6 Clicks" Daily Activity  Outcome Measure Difficulty turning over in bed (including adjusting bedclothes, sheets and blankets)?: Unable Difficulty moving from lying on back to sitting on the side of the bed? : Unable Difficulty sitting down on and standing up from a chair with arms (e.g., wheelchair, bedside commode, etc,.)?: Unable Help needed moving to and from a bed to chair (including a wheelchair)?: Total Help needed walking in hospital room?: Total Help needed climbing 3-5 steps with a railing? : Total 6 Click Score: 6    End of Session Equipment Utilized During Treatment: Gait belt Activity Tolerance: Patient limited by pain Patient left: in bed;with bed alarm set;with family/visitor present;with call bell/phone within reach Nurse Communication: Mobility status PT Visit Diagnosis: Difficulty in walking, not elsewhere classified (R26.2);Pain Pain - Right/Left: Left Pain - part of body: Leg    Time: 1449-1515 PT Time Calculation (min) (ACUTE ONLY): 26 min   Charges:   PT Evaluation $PT Eval Moderate Complexity: 1 Mod PT Treatments $Therapeutic Activity: 8-22 mins        Steven Cain, [place] Pager [phone] 06/09/2018   Steven Cain 06/09/2018, 4:31 PM

## 2018-06-09 NOTE — Consult Note (Signed)
The patient is well-known to [place].  He is an 84 year old gentleman who is 2 years out from a left total hip arthroplasty.  He has not had any previous problems with his left hip but then most recently developed left hip and thigh pain but also complains of pain going down to his foot they came on somewhat acutely.  He does have a history of stent placement and was seen in the emergency room yesterday and found to have a urinary tract infection.  He was admitted to the medicine service.  A CT scan and plain films of the left hip showed no significant periosteal reactions but there is evidence of potential chronic loosening of the component.  However he has not had any issues until this recent.  His wife is at the bedside.  On exam it certainly not an easy exam and some of this is his generalized demeanor.  He does not appear septic.  We can put his left hip the range of motion pretty easily and compress his hip and sometimes it seems to hurt but sometimes it does not.  But there is no warmth about his left proximal femur and his incisions healed nicely.  He does have some pain to palpation of the trochanteric area and IT band but he also has a positive straight leg raise and radicular pain going down to his foot on the left side.  We did independently review the plain films of his left hip and a CT scan and this appears to be more chronic changes.  Of note his white blood cell count peripherally is normal.  We will order a CRP and a sed rate.  Also feel that he would benefit from MRIs of both the left hip and the lumbar spine without contrast to get a better idea of what is the pain generator for him.  From an orthopedic standpoint he can weight-bear as tolerated and mobilize with assistance with therapy.

## 2018-06-10 ENCOUNTER — Inpatient Hospital Stay (HOSPITAL_COMMUNITY): Payer: Medicare Other

## 2018-06-10 DIAGNOSIS — N179 Acute kidney failure, unspecified: Secondary | ICD-10-CM

## 2018-06-10 LAB — BASIC METABOLIC PANEL
Anion gap: 9 (ref 5–15)
BUN: 15 mg/dL (ref 8–23)
CALCIUM: 8.6 mg/dL — AB (ref 8.9–10.3)
CHLORIDE: 101 mmol/L (ref 98–111)
CO2: 25 mmol/L (ref 22–32)
CREATININE: 1.61 mg/dL — AB (ref 0.61–1.24)
GFR calc Af Amer: 43 mL/min — ABNORMAL LOW (ref >60)
GFR, EST NON AFRICAN AMERICAN: 37 mL/min — AB (ref >60)
GLUCOSE: 95 mg/dL (ref 70–99)
Potassium: 4.5 mmol/L (ref 3.5–5.1)
Sodium: 135 mmol/L (ref 135–145)

## 2018-06-10 MED ORDER — AMLODIPINE BESYLATE 10 MG PO TABS
10.0000 mg | ORAL_TABLET | Freq: Every day | ORAL | Status: DC
  Administered 2018-06-10 – 2018-06-12 (×3): 10 mg via ORAL
  Filled 2018-06-10 (×3): qty 1

## 2018-06-10 MED ORDER — LORAZEPAM 2 MG/ML IJ SOLN
0.5000 mg | Freq: Once | INTRAMUSCULAR | Status: AC
  Administered 2018-06-10: 0.5 mg via INTRAVENOUS
  Filled 2018-06-10: qty 1

## 2018-06-10 NOTE — Progress Notes (Signed)
PROGRESS NOTE  Steven Cain:[id number] DOB: 02-25-1931 DOA: 06/08/2018 PCP: Shon Baton, MD  HPI/Recap of past 24 hours: Steven Cain is a 84 y.o. male with medical history significant of anxiety, recurrent UTI last UTI in August 2019 after changing a stent followed by urologist, hypertension admitted with nausea vomiting and left hip pain.  Patient reports having a bowel movement yesterday which was brown and normal.  He was not able to keep any liquid or food down yesterday.  Wife reports that patient drinks 4 to 5 L of seltzer water at home to keep himself hydrated to avoid UTIs.  He denies any chest pain shortness of breath cough fever he does report that he has a lot of chills at night.  Patient is a retired Engineer, drilling.  He denies any abdominal pain or urinary complaints at this time.  06/09/2018: Patient seen and examined with his wife at his bedside.  No acute events overnight.  Alert and oriented x3.  No pain in his left hip at rest however with minimal weight bearing he experiences significant pain.  Orthopedic surgery following.  MRI ordered to further assess.  06/10/2018: Patient seen and examined with his wife at his bedside.  No acute events overnight.  No new complaints however left hydronephrosis noted on renal ultrasound.  His urologist Dr. Tresa Moore contacted.  Assessment/Plan: Active Problems:   Hyponatremia   Acute lower UTI   Nausea & vomiting   Pain in left hip  Resolved hyponatremia suspect multifactorial secondary to excessive water intake at home versus SSRI On gentle IV fluid isotonic normal saline  Worsening AKI on CKD 3 Baseline creatinine 1.2 with GFR of 49 Creatinine 1.61 from 1.59 with GFR of 38 today Continue gentle IV fluid hydration with normal saline at 75 cc/h C/ to monitor urine output Avoid nephrotoxic agents Repeat BMP in the morning  Left hydronephrosis Has a ureteral stent in place Discussed with Dr. 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  All questions answered to her satisfaction.  Also daughter on the phone.  Disposition Plan: Home possibly tomorrow 06/11/2018 or when bed is available at [facility].   Consultants:  Orthopedic surgery  Procedures:  None  Antimicrobials:  IV cefepime  DVT prophylaxis: Subcu Lovenox daily   Objective: Vitals:   06/09/18 0441 06/09/18 1549 06/09/18 2050 06/10/18 0400  BP: 128/82 (!) 153/74 136/75 (!) 160/79  Pulse: 64 82 93 82  Resp: 14 16 14 16   Temp: 98.4 F (36.9 C) 98.5 F (36.9 C) 98 F (36.7 C) 98.2 F (36.8 C)  TempSrc: Oral Oral Oral Oral  SpO2: 100% 99% 100% 98%  Weight:    92.4 kg  Height:  Intake/Output Summary (Last 24 hours) at 06/10/2018 1435 Last data filed at 06/10/2018 1020 Gross per 24 hour  Intake 1653.92 ml  Output 2600 ml  Net -946.08 ml   Filed Weights   06/08/18 1043 06/10/18 0400  Weight: 90.7 kg 92.4 kg    Exam:  . General: 84 y.o. year-old male well-developed well-nourished in no acute distress.  Alert oriented x3. . Cardiovascular: Regular rate and rhythm with no  rubs or gallops.  No thyromegaly or JVD noted.   Marland Kitchen Respiratory: To auscultation with no wheezes or rales.  Good inspiratory effort.   . Abdomen: Soft nontender nondistended with normal bowel sounds x4 quadrants. . Musculoskeletal: Trace lower extremity edema. 2/4 pulses in all 4 extremities. . Skin: No ulcerative lesions; petechial rash noted in left thigh. . Psychiatry: Mood is appropriate for condition and setting   Data Reviewed: CBC: Recent Labs  Lab 06/08/18 1201 06/09/18 0513  WBC 8.5 8.0  HGB 11.7* 11.9*  HCT 36.0* 37.4*  MCV 83.9 87.4  PLT 267 458   Basic Metabolic Panel: Recent Labs  Lab 06/08/18 1201 06/09/18 0513 06/10/18 0539  NA 122* 133* 135  K 4.0 4.5 4.5  CL 86* 98 101  CO2 26 27 25   GLUCOSE 121* 97 95  BUN 12 15 15   CREATININE 1.28* 1.59* 1.61*  CALCIUM 8.8* 8.7* 8.6*   GFR: Estimated Creatinine Clearance: 37 mL/min (A) (by C-G formula based on SCr of 1.61 mg/dL (H)). Liver Function Tests: Recent Labs  Lab 06/08/18 1201  AST 25  ALT 11  ALKPHOS 95  BILITOT 0.5  PROT 7.7  ALBUMIN 3.4*   No results for input(s): LIPASE, AMYLASE in the last 168 hours. No results for input(s): AMMONIA in the last 168 hours. Coagulation Profile: No results for input(s): INR, PROTIME in the last 168 hours. Cardiac Enzymes: No results for input(s): CKTOTAL, CKMB, CKMBINDEX, TROPONINI in the last 168 hours. BNP (last 3 results) No results for input(s): PROBNP in the last 8760 hours. HbA1C: No results for input(s): HGBA1C in the last 72 hours. CBG: No results for input(s): GLUCAP in the last 168 hours. Lipid Profile: No results for input(s): CHOL, HDL, LDLCALC, TRIG, CHOLHDL, LDLDIRECT in the last 72 hours. Thyroid Function Tests: No results for input(s): TSH, T4TOTAL, FREET4, T3FREE, THYROIDAB in the last 72 hours. Anemia Panel: No results for input(s): VITAMINB12, FOLATE, FERRITIN, TIBC, IRON, RETICCTPCT in the last 72 hours. Urine analysis:    Component  Value Date/Time   COLORURINE YELLOW 06/08/2018 1155   APPEARANCEUR HAZY (A) 06/08/2018 1155   LABSPEC 1.008 06/08/2018 1155   PHURINE 8.0 06/08/2018 1155   GLUCOSEU NEGATIVE 06/08/2018 1155   HGBUR SMALL (A) 06/08/2018 1155   BILIRUBINUR NEGATIVE 06/08/2018 1155   KETONESUR NEGATIVE 06/08/2018 1155   PROTEINUR 30 (A) 06/08/2018 1155   UROBILINOGEN 0.2 05/06/2014 1815   NITRITE NEGATIVE 06/08/2018 1155   LEUKOCYTESUR LARGE (A) 06/08/2018 1155   Sepsis Labs: @LABRCNTIP (procalcitonin:4,lacticidven:4)  ) Recent Results (from the past 240 hour(s))  Urine Culture     Status: Abnormal (Preliminary result)   Collection Time: 06/08/18  2:54 PM  Result Value Ref Range Status   Specimen Description   Final    URINE, RANDOM Performed at [facility] [address] [place], [place] [address]    Special Requests   Final    Normal Performed at [facility], [place] [address], [place], [place] [address]    Culture (A)  Final  80,000 COLONIES/mL PSEUDOMONAS AERUGINOSA >=100,000 COLONIES/mL ENTEROCOCCUS FAECALIS SUSCEPTIBILITIES TO FOLLOW Performed at [facility], [place] [address], [place], [place] [address]    Report Status PENDING  Incomplete      Studies: Mr Lumbar Spine Wo Contrast  Result Date: 06/10/2018 CLINICAL DATA:  Low back pain radiating to LEFT lower extremity. EXAM: MRI LUMBAR SPINE WITHOUT CONTRAST TECHNIQUE: Multiplanar, multisequence MR imaging of the lumbar spine was performed. No intravenous contrast was administered. COMPARISON:  None. FINDINGS: Moderately motion degraded examination, per technologist, patient was in pain and had difficulty remaining still for examination. SEGMENTATION: For the purposes of this report, the last well-formed intervertebral disc is reported as L5-S1. ALIGNMENT: Maintained lumbar lordosis. Grade 1 L1-2 anterolisthesis and grade 1 L3-4 anterolisthesis. No definite spondylolysis though limited by  motion. VERTEBRAE:Vertebral bodies are intact. Severe disc height loss all lumbar levels with severe chronic discogenic endplate changes. Trace disc effusion L1-2 with generalized disc desiccation. Multilevel Schmorl's nodes, acute L2 inferior endplate Schmorl's node. CONUS MEDULLARIS AND CAUDA EQUINA: Conus medullaris terminates at L1 and demonstrates normal morphology and signal characteristics. Cauda equina is normal. PARASPINAL AND OTHER SOFT TISSUES: Nonacute. T2 bright cyst LEFT kidney incompletely evaluated. Moderate symmetric paraspinal muscle atrophy. DISC LEVELS: T12-L1: No disc bulge, canal stenosis nor neural foraminal narrowing. Mild facet arthropathy. L1-2: Anterolisthesis. Moderate to large broad-based disc bulge, mild facet arthropathy and ligamentum flavum redundancy. Severe canal stenosis. Moderate to severe RIGHT and mild LEFT neural foraminal narrowing. L2-3: Moderate broad-based disc bulge, moderate facet arthropathy and ligamentum flavum redundancy. Severe canal stenosis. Moderate RIGHT and mild LEFT neural foraminal narrowing. L3-4: Anterolisthesis. Small central disc protrusion, difficult to quantify due to patient motion and slice selection. Small broad-based disc bulge. At least moderate facet arthropathy and ligamentum flavum redundancy. Moderate canal stenosis. Moderate RIGHT and moderate to severe LEFT neural foraminal narrowing. L4-5: Moderate broad-based disc osteophyte complex. Mild RIGHT and moderate to severe LEFT facet arthropathy, 6 mm LEFT facet synovial cyst within paraspinal soft tissues. No canal stenosis. Moderate RIGHT and moderate to severe LEFT neural foraminal narrowing. L5-S1: Moderate broad-based disc bulge. Moderate to severe facet arthropathy with trace facet effusions which are likely reactive. No canal stenosis. Severe RIGHT and moderate to severe LEFT neural foraminal narrowing. IMPRESSION: 1. Motion degraded examination. 2. No fracture. Grade 1 L1-2 and grade 1  L3-4 anterolisthesis without spondylolysis. 3. Degenerative change of the lumbar spine resulting in severe canal stenosis L1-2 and L2-3. Moderate canal stenosis L3-4. 4. Neural foraminal narrowing all lumbar levels: Severe on the RIGHT at L5-S1; moderate to severe appear on the LEFT from L3-4 through L5-S1. Electronically Signed   By: Elon Alas M.D.   On: 06/10/2018 14:06   US Renal  Result Date: 06/09/2018 CLINICAL DATA:  Acute kidney injury.  Left ureteral stent. EXAM: RENAL / URINARY TRACT ULTRASOUND COMPLETE COMPARISON:  None. FINDINGS: Right Kidney: Length: 10.7 cm. Echogenicity within normal limits. No mass or hydronephrosis visualized. Left Kidney: Length: 12.1 cm. Diffuse renal cortical thinning. Moderate hydronephrosis. Stent visualized within the intrarenal collecting system extending to the upper pole. No renal mass or stone. Bladder: Left stent visualized.  No bladder mass or stone. IMPRESSION: 1. Moderate left hydronephrosis and diffuse left renal cortical thinning. 2. Well-positioned left ureteral stent. 3. Normal appearance of the right kidney. Electronically Signed   By: Lajean Manes M.D.   On: 06/09/2018 15:34    Scheduled Meds: . amLODipine  10 mg Oral Daily  . aspirin EC  81  mg Oral Daily  . enoxaparin (LOVENOX) injection  40 mg Subcutaneous Q24H  . guaiFENesin  1,200 mg Oral BID  . lidocaine  2 patch Transdermal Q24H  . PARoxetine  40 mg Oral Daily  . traZODone  50 mg Oral Once    Continuous Infusions: . sodium chloride 75 mL/hr at 06/10/18 0930  . ceFEPime (MAXIPIME) IV Stopped (06/09/18 1743)     LOS: 2 days     Kayleen Memos, MD [facility] Pager [phone]  If 7PM-7AM, please contact night-coverage [email or web address] Password [email or web address] 06/10/2018, 2:35 PM

## 2018-06-10 NOTE — Progress Notes (Signed)
On bedside report, RN reported that patient had PureWick implemented on 10/26.   Patient and wife were then educated that PureWick intervention should not be utilized on male patients and that we would need to remove it and find alternative ways to help him to urinate and keep him dry.  Patient's wife refused to allow that to happen and said its been working and that they wanted to keep it that way.

## 2018-06-10 NOTE — NC FL2 (Addendum)
[place] LEVEL OF CARE SCREENING TOOL     IDENTIFICATION  Patient Name: Steven Cain Birthdate: 07-28-1931 Sex: male Admission Date (Current Location): 06/08/2018  [facility] and [place] Number:  Herbalist and Address:  [facility],  [place], [place]      Provider Number: [id number]  Attending Physician Name and Address:  Kayleen Memos, DO  Relative Name and Phone Number:       Current Level of Care: Hospital Recommended Level of Care: [place] Prior Approval Number:    Date Approved/Denied:   PASRR Number: [id number]  Discharge Plan: SNF    Current Diagnoses: Patient Active Problem List   Diagnosis Date Noted  . Pain in left hip   . Acute lower UTI 06/08/2018  . Nausea & vomiting 06/08/2018  . Syncope due to orthostatic hypotension 03/23/2017  . Hyponatremia 03/22/2017  . Essential hypertension 03/22/2017  . Osteoarthritis of left hip 05/19/2016  . Status post left hip replacement 05/19/2016  . CKD (chronic kidney disease) stage 3, GFR 30-59 ml/min (HCC) 09/27/2014  . Anemia associated with acute blood loss 09/27/2014  . Orthostasis 09/27/2014  . GI bleed 09/27/2014  . Acute pyelonephritis 05/08/2014    Class: Acute  . Hydronephrosis of left kidney 05/08/2014    Class: Acute  . Ureteral mass 05/08/2014    Class: Acute  . UTI (urinary tract infection) 04/24/2013  . Chills 04/24/2013    Orientation RESPIRATION BLADDER Height & Weight     Self, Time, Situation, Place  Normal Incontinent, External catheter Weight: 203 lb 11.3 oz (92.4 kg) Height:  5\' 8"  (172.7 cm)  BEHAVIORAL SYMPTOMS/MOOD NEUROLOGICAL BOWEL NUTRITION STATUS      Continent Diet(heart healthy diet)  AMBULATORY STATUS COMMUNICATION OF NEEDS Skin   Extensive Assist Verbally Normal                       Personal Care Assistance Level of Assistance  Bathing, Feeding, Dressing Bathing Assistance: Maximum  assistance Feeding assistance: Independent Dressing Assistance: Limited assistance     Functional Limitations Info             SPECIAL CARE FACTORS FREQUENCY  PT (By licensed PT), OT (By licensed OT)     PT Frequency: 5x OT Frequency: 3x            Contractures Contractures Info: Not present    Additional Factors Info  Code Status, Allergies Code Status Info: full code Allergies Info: nka           Current Medications (06/10/2018):  This is the current hospital active medication list Current Facility-Administered Medications  Medication Dose Route Frequency Provider Last Rate Last Dose  . 0.9 %  sodium chloride infusion   Intravenous Continuous Georgette Shell, MD 75 mL/hr at 06/10/18 0930    . amLODipine (NORVASC) tablet 10 mg  10 mg Oral Daily Irene Pap N, DO   10 mg at 06/10/18 0623  . aspirin EC tablet 81 mg  81 mg Oral Daily Georgette Shell, MD   81 mg at 06/10/18 7628  . ceFEPIme (MAXIPIME) 1 g in sodium chloride 0.9 % 100 mL IVPB  1 g Intravenous Q24H Irene Pap N, DO   Stopped at 06/09/18 1743  . enoxaparin (LOVENOX) injection 40 mg  40 mg Subcutaneous Q24H Georgette Shell, MD   40 mg at 06/09/18 1949  . guaiFENesin (MUCINEX) 12 hr tablet 1,200 mg  1,200 mg Oral BID Georgette Shell, MD   1,200 mg at 06/10/18 8144  . lidocaine (LIDODERM) 5 % 2 patch  2 patch Transdermal Q24H Georgette Shell, MD   2 patch at 06/09/18 2134  . meclizine (ANTIVERT) tablet 25 mg  25 mg Oral BID PRN Georgette Shell, MD      . ondansetron [facility]) injection 4 mg  4 mg Intravenous Q6H PRN Blount, Scarlette Shorts T, NP      . oxyCODONE (Oxy IR/ROXICODONE) immediate release tablet 5 mg  5 mg Oral Q3H PRN Lovey Newcomer T, NP   5 mg at 06/09/18 2057  . PARoxetine (PAXIL) tablet 40 mg  40 mg Oral Daily Irene Pap N, DO   40 mg at 06/10/18 8185  . traZODone (DESYREL) tablet 50 mg  50 mg Oral Once Neila Gear, NP   Stopped at 06/08/18 2032     Discharge  Medications: Please see discharge summary for a list of discharge medications.  Relevant Imaging Results:  Relevant Lab Results:   Additional Information SS# [id number]  Nila Nephew, LCSW

## 2018-06-10 NOTE — Clinical Social Work Note (Signed)
Clinical Social Work Assessment  Patient Details  Name: Steven Cain MRN: [id number] Date of Birth: 1930-12-19  Date of referral:  06/10/18               Reason for consult:  Facility Placement, Discharge Planning                Permission sought to share information with:  Family Supports Permission granted to share information::  Yes, Verbal Permission Granted  Name::     wife Glass blower/designer::     Relationship::     Contact Information:     Housing/Transportation Living arrangements for the past 2 months:  Single Family Home Source of Information:  Patient, Spouse Patient Interpreter Needed:  None Criminal Activity/Legal Involvement Pertinent to Current Situation/Hospitalization:  No - Comment as needed Significant Relationships:  Adult Children, Spouse Lives with:  Spouse Do you feel safe going back to the place where you live?  Yes Need for family participation in patient care:  Yes (Comment)(family involved)  Care giving concerns:  Pt admitted from home where he resides with his wife. Is alone much of the day due to family schedule. 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   Social Worker assessment / plan:  CSW consulted to assist with disposition- pt recommended to receive rehab at [facility] post DC. Met with pt at bedside along with wife- both very engaged and welcoming of CSW involvement. Pt states he received home health therapy 2 years ago after hip surgery and would prefer that again, however wife encouraged him to consider rehab as she feels his assistance needs and deconditioning is markedly changed from baseline and family will not be able to manage at home. Pt agreed. CSW submitted for pasrr, completed FL2, made referrals and will follow up with bed offers. Pasrr went to manual review- will provide addt'l information when requested by [place] [facility].  Plan: SNF at [facility] and bed offers  pending  Employment status:  Retired Forensic scientist:  Medicare PT Recommendations:  [place] / Referral to community resources:  [place]  Patient/Family's Response to care:  Appreciative  Patient/Family's Understanding of and Emotional Response to Diagnosis, Current Treatment, and Prognosis:  Both pt and wife show good understanding of treatment and diagnoses thus far, state they are not sure what plan is for the rest of care here. Emotionally very positive about recovery.  Emotional Assessment Appearance:  Appears stated age Attitude/Demeanor/Rapport:  Engaged Affect (typically observed):  Pleasant Orientation:  Oriented to Self, Oriented to Place, Oriented to  Time, Oriented to Situation Alcohol / Substance use:  Not Applicable Psych involvement (Current and /or in the community):  No (Comment)  Discharge Needs  Concerns to be addressed:  Discharge Planning Concerns Readmission within the last 30 days:  No Current discharge risk:  Dependent with Mobility Barriers to Discharge:  [place] ([place]), Continued Medical Work up   [employer], LCSW 06/10/2018, 9:37 AM  [phone]

## 2018-06-11 ENCOUNTER — Inpatient Hospital Stay (HOSPITAL_COMMUNITY): Payer: Medicare Other

## 2018-06-11 LAB — BASIC METABOLIC PANEL
ANION GAP: 8 (ref 5–15)
BUN: 13 mg/dL (ref 8–23)
CHLORIDE: 96 mmol/L — AB (ref 98–111)
CO2: 28 mmol/L (ref 22–32)
Calcium: 8.6 mg/dL — ABNORMAL LOW (ref 8.9–10.3)
Creatinine, Ser: 1.59 mg/dL — ABNORMAL HIGH (ref 0.61–1.24)
GFR calc Af Amer: 44 mL/min — ABNORMAL LOW (ref >60)
GFR, EST NON AFRICAN AMERICAN: 38 mL/min — AB (ref >60)
GLUCOSE: 108 mg/dL — AB (ref 70–99)
POTASSIUM: 4.3 mmol/L (ref 3.5–5.1)
Sodium: 132 mmol/L — ABNORMAL LOW (ref 135–145)

## 2018-06-11 LAB — URINE CULTURE
Culture: 80000 — AB
Special Requests: NORMAL

## 2018-06-11 MED ORDER — SODIUM CHLORIDE 3 % IN NEBU
4.0000 mL | INHALATION_SOLUTION | Freq: Three times a day (TID) | RESPIRATORY_TRACT | Status: DC
  Administered 2018-06-11: 4 mL via RESPIRATORY_TRACT
  Filled 2018-06-11 (×3): qty 4

## 2018-06-11 MED ORDER — IPRATROPIUM-ALBUTEROL 0.5-2.5 (3) MG/3ML IN SOLN
3.0000 mL | Freq: Four times a day (QID) | RESPIRATORY_TRACT | Status: DC
  Administered 2018-06-11 – 2018-06-12 (×2): 3 mL via RESPIRATORY_TRACT
  Filled 2018-06-11 (×2): qty 3

## 2018-06-11 MED ORDER — AMOXICILLIN 250 MG PO CAPS
500.0000 mg | ORAL_CAPSULE | Freq: Three times a day (TID) | ORAL | Status: DC
  Administered 2018-06-11 – 2018-06-12 (×3): 500 mg via ORAL
  Filled 2018-06-11 (×3): qty 2

## 2018-06-11 NOTE — Progress Notes (Signed)
Patient ID: RICHMOND COLDREN, male   DOB: Feb 04, 1931, 84 y.o.   MRN: [id number] I was able to review the MRI of his lumbar spine and hip.  There is certainly significant stenosis at the lower levels of the lumbar spine especially to the left which could be causing his left-sided radicular symptoms.  There was no large fluid collection around the left hip or any evidence of an acute process.  Certainly he may have some evidence of loosening of his prosthesis, but this would not cause severe acute pain and would be more of a dull, achy type of pain.  I spoke with his wife today and examined him again this morning at the bedside.  He does have some lower extremity pain on the left side black it compresses hip and move him around and he felt comfortable.  There is certainly some pain present and is difficult to truly determine the pain generator, however, there is no indication for any type of surgical intervention at the moment.  I would certainly recommend having therapy attempt to mobilize him and he will likely need short-term skilled nursing for mobility.  Certainly pain control is essential as well.  From the orthopedic standpoint, he can be seen as an outpatient.  As far as interventions ago, he could potentially benefit from an epidural steroid injection on the left side at L3-4 to see if this will temporize his symptoms.

## 2018-06-11 NOTE — Progress Notes (Signed)
PROGRESS NOTE  Steven Cain IWL:[id number] DOB: 25-Nov-1930 DOA: 06/08/2018 PCP: Shon Baton, MD  HPI/Recap of past 24 hours: Steven Cain is a 84 y.o. male with medical history significant of anxiety, recurrent UTI, last UTI in August 2019 after changing a stent, followed by urologist Dr Tresa Moore, hypertension, admitted with nausea vomiting and left hip pain.    Treated for UTI and hyponatremia.  Hospital course complicated by generalized weakness/physical debility and intractable left hip pain post arthroplasty.  Orthopedic surgery Dr. Ninfa Linden consulted and followed. Recommended conservative management with physical therapy.  MRI left hip unrevealing. MRI lumbar spine revealed significant stenosis at the lower levels.    Left hydronephrosis noted on renal ultrasound.  Discussed with urology Dr. Tresa Moore who recommended a conservative approach with no change in management.  06/11/2018: Patient seen and examined with his wife at bedside.  No acute events overnight.  Wheezing this morning.  Started pulmonary toilet and duo nebs scheduled.  Assessment/Plan: Active Problems:   Hyponatremia   Acute lower UTI   Nausea & vomiting   Pain in left hip  Resolved hyponatremia suspect multifactorial secondary to excessive water intake at home versus SSRI Stop IV fluid to avoid fluid overload  AKI on CKD 3, improving Baseline creatinine 1.2 with GFR of 49 Creatinine 1.59 from 1.61 Stopped IV fluids due to wheezing to avoid fluid overload C/ to monitor urine output Avoid nephrotoxic agents Repeat BMP in the morning  Chronic bronchitis Intermittent cough with wheezing this morning Chest x-ray done on 06/11/2018 independently reviewed revealed no lobular infiltrates.  No sign of significant increase in pulmonary vascularity to indicate pulmonary edema Stop IV fluid to avoid fluid overload Start duo nebs every 6 hours Start pulmonary toilet with Mucinex 1200 mg twice daily and hypersaline nebs 3  times daily Maintain O2 saturation above 92%  Left hydronephrosis Has a ureteral stent in place Discussed with Dr. 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 Ninfa Linden followed and recommended potentially benefit from epidural steroid injection on the left side of L3-4 to help with his symptoms  Ambulatory dysfunction/physical debility PT assessed and recommended SNF Case manager consulted for SNF placement Fall precautions Awaiting bed placement   Code Status: Full code  Family Communication: Wife at bedside.    Disposition Plan: SNF possibly tomorrow 06/12/2018 or when bed is available.  Consultants:  Orthopedic surgery  Procedures:  None  Antimicrobials:  IV cefepime  P.o. amoxicillin  DVT prophylaxis: Subcu Lovenox daily   Objective: Vitals:   06/10/18 0400 06/10/18 2027 06/11/18 0515 06/11/18 1338  BP: (!) 160/79 (!) 153/82 (!) 181/80 (!) 153/79  Pulse: 82 80 84 88  Resp: 16 20  18   Temp: 98.2 F (36.8 C) 98.2 F (36.8  C) 98.1 F (36.7 C) 98.9 F (37.2 C)  TempSrc: Oral Oral Oral Oral  SpO2: 98% 96% 95% 97%  Weight: 92.4 kg  93.4 kg    Height:        Intake/Output Summary (Last 24 hours) at 06/11/2018 1614 Last data filed at 06/11/2018 1454 Gross per 24 hour  Intake 1565.85 ml  Output 1925 ml  Net -359.15 ml   Filed Weights   06/08/18 1043 06/10/18 0400 06/11/18 0515  Weight: 90.7 kg 92.4 kg 93.4 kg    Exam:  . General: 84 y.o. year-old male pleasant, well-developed and well-nourished appears mildly in uncomfortable due to wheezes and intermittent cough.  Alert and interactive. . Cardiovascular: Regular rate and rhythm with no rubs or gallops.  No JVD or thyromegaly noted. Marland Kitchen Respiratory: Diffuse wheezes noted with no rales.  Good inspiratory effort. . Abdomen: Soft nontender nondistended with normal bowel sounds x4 quadrants. . Musculoskeletal: Trace lower extremity edema. 2/4 pulses in all 4 extremities. . Skin: No ulcerative lesions; petechial rash noted in left thigh. . Psychiatry: Mood is appropriate for condition and setting   Data Reviewed: CBC: Recent Labs  Lab 06/08/18 1201 06/09/18 0513  WBC 8.5 8.0  HGB 11.7* 11.9*  HCT 36.0* 37.4*  MCV 83.9 87.4  PLT 267 341   Basic Metabolic Panel: Recent Labs  Lab 06/08/18 1201 06/09/18 0513 06/10/18 0539 06/11/18 1040  NA 122* 133* 135 132*  K 4.0 4.5 4.5 4.3  CL 86* 98 101 96*  CO2 26 27 25 28   GLUCOSE 121* 97 95 108*  BUN 12 15 15 13   CREATININE 1.28* 1.59* 1.61* 1.59*  CALCIUM 8.8* 8.7* 8.6* 8.6*   GFR: Estimated Creatinine Clearance: 37.7 mL/min (A) (by C-G formula based on SCr of 1.59 mg/dL (H)). Liver Function Tests: Recent Labs  Lab 06/08/18 1201  AST 25  ALT 11  ALKPHOS 95  BILITOT 0.5  PROT 7.7  ALBUMIN 3.4*   No results for input(s): LIPASE, AMYLASE in the last 168 hours. No results for input(s): AMMONIA in the last 168 hours. Coagulation Profile: No results for input(s): INR, PROTIME in the last 168 hours. Cardiac Enzymes: No results for input(s): CKTOTAL, CKMB, CKMBINDEX, TROPONINI in the last 168 hours. BNP (last  3 results) No results for input(s): PROBNP in the last 8760 hours. HbA1C: No results for input(s): HGBA1C in the last 72 hours. CBG: No results for input(s): GLUCAP in the last 168 hours. Lipid Profile: No results for input(s): CHOL, HDL, LDLCALC, TRIG, CHOLHDL, LDLDIRECT in the last 72 hours. Thyroid Function Tests: No results for input(s): TSH, T4TOTAL, FREET4, T3FREE, THYROIDAB in the last 72 hours. Anemia Panel: No results for input(s): VITAMINB12, FOLATE, FERRITIN, TIBC, IRON, RETICCTPCT in the last 72 hours. Urine analysis:    Component Value Date/Time   COLORURINE YELLOW 06/08/2018 1155   APPEARANCEUR HAZY (A) 06/08/2018 1155   LABSPEC 1.008 06/08/2018 1155   PHURINE 8.0 06/08/2018 1155   GLUCOSEU NEGATIVE 06/08/2018 1155   HGBUR SMALL (A) 06/08/2018 1155   BILIRUBINUR NEGATIVE 06/08/2018 1155   KETONESUR NEGATIVE 06/08/2018 1155   PROTEINUR 30 (A) 06/08/2018 1155   UROBILINOGEN 0.2 05/06/2014 1815   NITRITE NEGATIVE 06/08/2018 1155   LEUKOCYTESUR LARGE (A) 06/08/2018 1155   Sepsis Labs: @LABRCNTIP (procalcitonin:4,lacticidven:4)  ) Recent Results (from the past 240 hour(s))  Urine Culture     Status: Abnormal   Collection Time: 06/08/18  2:54 PM  Result Value Ref Range Status   Specimen Description  Final    URINE, RANDOM Performed at [facility], [place] [address] [place], [place] [address]    Special Requests   Final    Normal Performed at [facility], [place] [address] [place], [place] [address]    Culture (A)  Final    80,000 COLONIES/mL PSEUDOMONAS AERUGINOSA >=100,000 COLONIES/mL ENTEROCOCCUS FAECALIS    Report Status 06/11/2018 FINAL  Final   Organism ID, Bacteria PSEUDOMONAS AERUGINOSA (A)  Final   Organism ID, Bacteria ENTEROCOCCUS FAECALIS (A)  Final      Susceptibility   Enterococcus faecalis - MIC*    AMPICILLIN <=2 SENSITIVE Sensitive     LEVOFLOXACIN 1 SENSITIVE Sensitive     NITROFURANTOIN <=16 SENSITIVE  Sensitive     VANCOMYCIN 1 SENSITIVE Sensitive     * >=100,000 COLONIES/mL ENTEROCOCCUS FAECALIS   Pseudomonas aeruginosa - MIC*    CEFTAZIDIME 2 SENSITIVE Sensitive     CIPROFLOXACIN <=0.25 SENSITIVE Sensitive     GENTAMICIN <=1 SENSITIVE Sensitive     IMIPENEM <=0.25 SENSITIVE Sensitive     PIP/TAZO <=4 SENSITIVE Sensitive     CEFEPIME <=1 SENSITIVE Sensitive     * 80,000 COLONIES/mL PSEUDOMONAS AERUGINOSA      Studies: Dg Chest Port 1 View  Result Date: 06/11/2018 CLINICAL DATA:  Unable to keep food or liquids down yesterday. No current abdominal or urinary complaints. History of aortic and tricuspid regurgitation. EXAM: PORTABLE CHEST 1 VIEW COMPARISON:  Chest x-ray of October 09, 2015 FINDINGS: The lungs are reasonably well expanded. There is no focal infiltrate. The interstitial markings are coarse though stable. There is no pleural effusion. The heart and pulmonary vascularity are normal. The mediastinum is normal in width. There is mild tortuosity of the descending thoracic aorta. There are degenerative changes of the left shoulder. IMPRESSION: Stable chronic bronchitic changes. There is no active cardiopulmonary disease. Electronically Signed   By: David  Martinique M.D.   On: 06/11/2018 13:54    Scheduled Meds: . amLODipine  10 mg Oral Daily  . amoxicillin  500 mg Oral Q8H  . aspirin EC  81 mg Oral Daily  . enoxaparin (LOVENOX) injection  40 mg Subcutaneous Q24H  . guaiFENesin  1,200 mg Oral BID  . ipratropium-albuterol  3 mL Nebulization Q6H  . lidocaine  2 patch Transdermal Q24H  . PARoxetine  40 mg Oral Daily  . sodium chloride HYPERTONIC  4 mL Nebulization TID    Continuous Infusions: . ceFEPime (MAXIPIME) IV 1 g (06/11/18 1553)     LOS: 3 days     Kayleen Memos, MD [facility] Pager [phone]  If 7PM-7AM, please contact night-coverage [email or web address] Password [email or web address] 06/11/2018, 4:14 PM

## 2018-06-11 NOTE — Care Management Important Message (Signed)
Important Message  Patient Details  Name: JERREMY MAIONE MRN: [id number] Date of Birth: 1930/11/01   Medicare Important Message Given:  Yes    Kerin Salen 06/11/2018, 11:04 AMImportant Message  Patient Details  Name: JEDAIAH RATHBUN MRN: [id number] Date of Birth: Jan 11, 1931   Medicare Important Message Given:  Yes    Kerin Salen 06/11/2018, 11:04 AM

## 2018-06-12 ENCOUNTER — Ambulatory Visit (INDEPENDENT_AMBULATORY_CARE_PROVIDER_SITE_OTHER): Payer: Medicare Other | Admitting: Orthopaedic Surgery

## 2018-06-12 DIAGNOSIS — N10 Acute tubulo-interstitial nephritis: Secondary | ICD-10-CM | POA: Diagnosis not present

## 2018-06-12 DIAGNOSIS — N390 Urinary tract infection, site not specified: Secondary | ICD-10-CM | POA: Diagnosis not present

## 2018-06-12 DIAGNOSIS — R2681 Unsteadiness on feet: Secondary | ICD-10-CM | POA: Diagnosis not present

## 2018-06-12 DIAGNOSIS — M6281 Muscle weakness (generalized): Secondary | ICD-10-CM | POA: Diagnosis not present

## 2018-06-12 DIAGNOSIS — F329 Major depressive disorder, single episode, unspecified: Secondary | ICD-10-CM | POA: Diagnosis not present

## 2018-06-12 DIAGNOSIS — N183 Chronic kidney disease, stage 3 (moderate): Secondary | ICD-10-CM | POA: Diagnosis not present

## 2018-06-12 DIAGNOSIS — Z96642 Presence of left artificial hip joint: Secondary | ICD-10-CM | POA: Diagnosis not present

## 2018-06-12 DIAGNOSIS — E860 Dehydration: Secondary | ICD-10-CM | POA: Diagnosis not present

## 2018-06-12 DIAGNOSIS — M7062 Trochanteric bursitis, left hip: Secondary | ICD-10-CM | POA: Diagnosis not present

## 2018-06-12 DIAGNOSIS — I10 Essential (primary) hypertension: Secondary | ICD-10-CM | POA: Diagnosis not present

## 2018-06-12 DIAGNOSIS — M25552 Pain in left hip: Secondary | ICD-10-CM | POA: Diagnosis not present

## 2018-06-12 DIAGNOSIS — M48061 Spinal stenosis, lumbar region without neurogenic claudication: Secondary | ICD-10-CM | POA: Diagnosis not present

## 2018-06-12 DIAGNOSIS — R41841 Cognitive communication deficit: Secondary | ICD-10-CM | POA: Diagnosis not present

## 2018-06-12 DIAGNOSIS — M2550 Pain in unspecified joint: Secondary | ICD-10-CM | POA: Diagnosis not present

## 2018-06-12 DIAGNOSIS — R278 Other lack of coordination: Secondary | ICD-10-CM | POA: Diagnosis not present

## 2018-06-12 DIAGNOSIS — E871 Hypo-osmolality and hyponatremia: Secondary | ICD-10-CM | POA: Diagnosis not present

## 2018-06-12 DIAGNOSIS — Z7401 Bed confinement status: Secondary | ICD-10-CM | POA: Diagnosis not present

## 2018-06-12 DIAGNOSIS — R2689 Other abnormalities of gait and mobility: Secondary | ICD-10-CM | POA: Diagnosis not present

## 2018-06-12 MED ORDER — SENNOSIDES-DOCUSATE SODIUM 8.6-50 MG PO TABS: 1.0000 | ORAL_TABLET | Freq: Every day | ORAL | 0 refills | Status: AC

## 2018-06-12 MED ORDER — BISACODYL 10 MG RE SUPP: 10.0000 mg | Freq: Every day | RECTAL | Status: DC | PRN

## 2018-06-12 MED ORDER — SODIUM CHLORIDE 3 % IN NEBU
4.0000 mL | INHALATION_SOLUTION | Freq: Three times a day (TID) | RESPIRATORY_TRACT | Status: DC
  Administered 2018-06-12: 4 mL via RESPIRATORY_TRACT
  Filled 2018-06-12 (×3): qty 4

## 2018-06-12 MED ORDER — IPRATROPIUM-ALBUTEROL 0.5-2.5 (3) MG/3ML IN SOLN
3.0000 mL | Freq: Three times a day (TID) | RESPIRATORY_TRACT | Status: DC
  Administered 2018-06-12 (×2): 3 mL via RESPIRATORY_TRACT
  Filled 2018-06-12 (×2): qty 3

## 2018-06-12 MED ORDER — CIPROFLOXACIN HCL 250 MG PO TABS
250.0000 mg | ORAL_TABLET | Freq: Two times a day (BID) | ORAL | Status: DC
  Administered 2018-06-12: 250 mg via ORAL
  Filled 2018-06-12: qty 1

## 2018-06-12 MED ORDER — SENNOSIDES-DOCUSATE SODIUM 8.6-50 MG PO TABS: 1.0000 | ORAL_TABLET | Freq: Every day | ORAL | Status: DC

## 2018-06-12 MED ORDER — OXYCODONE HCL 5 MG PO TABS: 2.5000 mg | ORAL_TABLET | Freq: Four times a day (QID) | ORAL | 0 refills | Status: AC | PRN

## 2018-06-12 MED ORDER — POLYETHYLENE GLYCOL 3350 17 G PO PACK
17.0000 g | PACK | Freq: Once | ORAL | Status: AC
  Administered 2018-06-12: 17 g via ORAL
  Filled 2018-06-12: qty 1

## 2018-06-12 MED ORDER — POLYETHYLENE GLYCOL 3350 17 G PO PACK: 17.0000 g | PACK | Freq: Every day | ORAL | 0 refills | Status: DC | PRN

## 2018-06-12 MED ORDER — CIPROFLOXACIN HCL 250 MG PO TABS: 250.0000 mg | ORAL_TABLET | Freq: Two times a day (BID) | ORAL | 0 refills | Status: DC

## 2018-06-12 MED ORDER — BISACODYL 10 MG RE SUPP: 10.0000 mg | Freq: Every day | RECTAL | 0 refills | Status: DC | PRN

## 2018-06-12 MED ORDER — PAROXETINE HCL 40 MG PO TABS: 40.0000 mg | ORAL_TABLET | Freq: Every day | ORAL | 11 refills | Status: AC

## 2018-06-12 MED ORDER — AMLODIPINE BESYLATE 10 MG PO TABS: 10.0000 mg | ORAL_TABLET | Freq: Every day | ORAL | 11 refills | Status: DC

## 2018-06-12 NOTE — Clinical Social Work Placement (Signed)
Pt admitting to [facility] room #908-P. Report#[phone]. PTAR transportation arranged. DC information provided to facility. Wife at bedside agreeable- states she will bring pt's belongings to facility.   CLINICAL SOCIAL WORK PLACEMENT  NOTE  Date:  06/12/2018  Patient Details  Name: Steven Cain MRN: [id number] Date of Birth: 1931-03-09  Clinical Social Work is seeking post-discharge placement for this patient at the [place] level of care (*CSW will initial, date and re-position this form in  chart as items are completed):  Yes   Patient/family provided with [facility] Work Department's list of facilities offering this level of care within the geographic area requested by the patient (or if unable, by the patient's family).  Yes   Patient/family informed of their freedom to choose among providers that offer the needed level of care, that participate in Medicare, Medicaid or managed care program needed by the patient, have an available bed and are willing to accept the patient.  Yes   Patient/family informed of [facility]'s ownership interest in [facility] and [facility], as well as of the fact that they are under no obligation to receive care at these facilities.  PASRR submitted to EDS on 06/10/18     PASRR number received on 06/10/18     Existing PASRR number confirmed on       FL2 transmitted to all facilities in geographic area requested by pt/family on 06/10/18     FL2 transmitted to all facilities within larger geographic area on       Patient informed that his/her managed care company has contracts with or will negotiate with certain facilities, including the following:        Yes   Patient/family informed of bed offers received.  Patient chooses bed at [facility]     Physician recommends and patient chooses bed at [facility]    Patient to be transferred to [facility] on 06/12/18.  Patient to be transferred to  facility by PTAR     Patient family notified on 06/12/18 of transfer.  Name of family member notified:  [name]     PHYSICIAN       Additional Comment:    _______________________________________________ Nila Nephew, LCSW 06/12/2018, 1:58 PM [phone]

## 2018-06-12 NOTE — Progress Notes (Signed)
Physical Therapy Treatment Patient Details Name: Steven Cain MRN: [id number] DOB: [birth year] Today's Date: 06/12/2018    History of Present Illness Steven Cain is a 84 y.o. male with medical history significant of anxiety, recurrent UTI last UTI in August 2019 after changing a stent followed by urologist, hypertension admitted with nausea vomiting and left hip pain.     PT Comments    Pt tolerated increased activity level today, he ambulated 32' with RW and min/guard assist. Mod assist for sit to stand. LLE pain improved per pt but still limiting activity tolerance. Hot pack applied to L thigh.    Follow Up Recommendations  SNF     Equipment Recommendations  None recommended by PT    Recommendations for Other Services       Precautions / Restrictions Precautions Precautions: Fall Restrictions Weight Bearing Restrictions: Yes LLE Weight Bearing: Weight bearing as tolerated    Mobility  Bed Mobility Overal bed mobility: Needs Assistance Bed Mobility: Supine to Sit     Supine to sit: HOB elevated;Mod assist     General bed mobility comments: assist to raise trunk, pt able to advance LEs independently  Transfers Overall transfer level: Needs assistance Equipment used: Rolling walker (2 wheeled) Transfers: Sit to/from Stand Sit to Stand: Mod assist         General transfer comment: assist to rise/steady x 2 trials, VCs hand placement  Ambulation/Gait Ambulation/Gait assistance: Min guard Gait Distance (Feet): 35 Feet Assistive device: Rolling walker (2 wheeled) Gait Pattern/deviations: Decreased stride length;Decreased step length - right;Decreased step length - left Gait velocity: decr   General Gait Details: VCs for positioning in RW, min/guard safety/balance   Stairs             Wheelchair Mobility    Modified Rankin (Stroke Patients Only)       Balance Overall balance assessment: Needs assistance Sitting-balance support: Feet  supported;No upper extremity supported Sitting balance-Leahy Scale: Fair     Standing balance support: Bilateral upper extremity supported Standing balance-Leahy Scale: Poor Standing balance comment: relies on BUE support                            Cognition Arousal/Alertness: Awake/alert Behavior During Therapy: WFL for tasks assessed/performed Overall Cognitive Status: Within Functional Limits for tasks assessed                                 General Comments: some memory issues, doesn't recall when he had his L hip replacement (thought it was recent, but wife stated is was years ago)      Exercises General Exercises - Lower Extremity Long Arc Quad: AROM;5 reps;Left;Seated    General Comments        Pertinent Vitals/Pain Pain Score: 6  Pain Location: L thigh Pain Descriptors / Indicators: Aching Pain Intervention(s): Limited activity within patient's tolerance;Monitored during session;Heat applied    Home Living                      Prior Function            PT Goals (current goals can now be found in the care plan section) Acute Rehab PT Goals Patient Stated Goal: decrease pain PT Goal Formulation: With patient/family Time For Goal Achievement: 06/23/18 Potential to Achieve Goals: Good Progress towards PT goals: Progressing toward goals  Frequency    Min 2X/week      PT Plan Current plan remains appropriate    Co-evaluation              AM-PAC PT "6 Clicks" Daily Activity  Outcome Measure  Difficulty turning over in bed (including adjusting bedclothes, sheets and blankets)?: Unable Difficulty moving from lying on back to sitting on the side of the bed? : Unable Difficulty sitting down on and standing up from a chair with arms (e.g., wheelchair, bedside commode, etc,.)?: Unable Help needed moving to and from a bed to chair (including a wheelchair)?: A Lot Help needed walking in hospital room?: A Little Help  needed climbing 3-5 steps with a railing? : A Lot 6 Click Score: 10    End of Session Equipment Utilized During Treatment: Gait belt Activity Tolerance: Patient limited by pain;Patient limited by fatigue Patient left: with family/visitor present;with call bell/phone within reach;in chair Nurse Communication: Mobility status PT Visit Diagnosis: Difficulty in walking, not elsewhere classified (R26.2);Pain Pain - Right/Left: Left Pain - part of body: Leg     Time: 7494-4967 PT Time Calculation (min) (ACUTE ONLY): 30 min  Charges:  $Gait Training: 8-22 mins $Therapeutic Activity: 8-22 mins                    Blondell Reveal Kistler PT 06/12/2018  Acute Rehabilitation Services Pager [phone] Office [phone]

## 2018-06-12 NOTE — Progress Notes (Signed)
Report called to Ramatu, RN at [facility] Pt discharged to facility in stable condition. No immediate questions or concerns. Transferred to facility via [facility].

## 2018-06-12 NOTE — Progress Notes (Signed)
Provided pt and wife with SNF bed offers- selected [facility]. Per admissions no beds there until tomorrow earliest. Family states second choice [facility]- per admissions room available today. Confirmed with wife agreeable to admitting to [facility]. Will facilitate transition there once pt ready for DC.  Sharren Bridge, MSW, LCSW Clinical Social Work 06/12/2018 [phone]

## 2018-06-12 NOTE — Discharge Summary (Addendum)
Physician Discharge Summary  Steven Cain UYQ:[id number] DOB: [birth year] DOA: 06/08/2018  PCP: Shon Baton, MD  Admit date: 06/08/2018 Discharge date: 06/12/2018  Admitted From: Home  Disposition:  SNF [facility]   Recommendations for Outpatient Follow-up:  1. Follow up with PCP in 1-2 weeks after discharge from SNF 2. Please obtain BMP/CBC in one week 3. Please follow up with Dr. Ninfa Linden in 2 weeks 4. Please follow up with Dr. Tresa Moore within 2 months 5. Please monitor BP and start second agent if BP persistently >140/90 6. 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  Found in the ER to have hyponatremia, pyuria, generalized weakness.      PRINCIPAL HOSPITAL DIAGNOSIS: Dehydration    Discharge Diagnoses:    Dehydration Resolved with fluids  Hyponatremia Paxil was reduced.  He was given IV fluids and his sodium corrected.  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  Chronic kidney disease stage III Baseline creatinine 1.6  Complicated urinary tract infection The patient has long-term ureteral stent.  He had dysuria reported to other providers, and a urine culture grew Pseudomonas and enterococcus.  He had no systemic symptoms.  This was discussed with his urologist, Dr. Tresa Moore by phone, who recommended close follow-up, oral antibiotic treatment with cipro renally dosed for 10 days.  Hypertension The patient's amlodipine was increased due to high blood pressure in the hospital.  Needs close follow-up.  Depression/anxiety Paxil dose was reduced.  Needs close follow-up with  his primary Paxil prescriber.  Left hip pain, suspected sciatica/lumbar stenosis This was discussed with Dr.Blackman, who recommends conservative mgmt,outpatient follow up with Orthopedics for possible injections.      Discharge Instructions  Discharge Instructions    Diet - low sodium heart healthy   Complete by:  As directed    Diet general   Complete by:  As directed    Discharge instructions   Complete by:  As directed    From Dr. Loleta Books: You were admitted with hip pain, nausea/retching, low sodium and confusion.  For the hip pain, your imaging was unrevealing.  Please follow up by calling Dr. Trevor Mace office for a follow up appointment as needed. I have provided oxycodone 2.5 to 5 mg as needed, but please minimize this or stop it entirely.  For your sodium: This is probably exacerbated by Paxil, so this dose was reduced. Please follow up within 1 week of discharge from skilled nursing with the prescriber of this medicine.   For your blood pressure: It was elevated in the hospital, please have the skilled nursing monitor your blood pressure.  For the urine test: I have prescribed Cipro 250 mg twice daily for ten days after discussing with Dr. Tresa Moore.  Please take this, and make sure that you have a follow up appointment with him in the next 1-2 months.  If you have worsening pain with urination, pain in the kidney, or fever, call his office or come to the ER.   Increase activity slowly   Complete by:  As directed    Increase activity slowly   Complete by:  As directed  Allergies as of 06/12/2018   No Known Allergies     Medication List    TAKE these medications   amLODipine 10 MG tablet Commonly known as:  NORVASC Take 1 tablet (10 mg total) by mouth daily. Start taking on:  06/13/2018 What changed:    medication strength  how much to take   aspirin EC 81 MG tablet Take 81 mg by mouth daily.   b complex vitamins tablet Take 1 tablet by mouth  daily.   bisacodyl 10 MG suppository Commonly known as:  DULCOLAX Place 1 suppository (10 mg total) rectally daily as needed for moderate constipation.   ciprofloxacin 250 MG tablet Commonly known as:  CIPRO Take 1 tablet (250 mg total) by mouth 2 (two) times daily.   co-enzyme Q-10 30 MG capsule Take 30 mg by mouth as needed.   cyanocobalamin 1000 MCG/ML injection Commonly known as:  (VITAMIN B-12) Inject 1,000 mcg into the muscle every 30 (thirty) days.   fish oil-omega-3 fatty acids 1000 MG capsule Take 2 g by mouth every morning.   guaiFENesin 600 MG 12 hr tablet Commonly known as:  MUCINEX Take 1,200 mg by mouth 2 (two) times daily.   MAGNESIUM PO Take 1 tablet by mouth daily.   meclizine 25 MG tablet Commonly known as:  ANTIVERT Take 25 mg by mouth 2 (two) times daily as needed for dizziness.   multivitamin with minerals Tabs tablet Take 1 tablet by mouth every morning.   oxyCODONE 5 MG immediate release tablet Commonly known as:  Oxy IR/ROXICODONE Take 0.5-1 tablets (2.5-5 mg total) by mouth every 6 (six) hours as needed for up to 5 days for severe pain.   PARoxetine 40 MG tablet Commonly known as:  PAXIL Take 1 tablet (40 mg total) by mouth daily. Start taking on:  06/13/2018 What changed:    medication strength  when to take this  additional instructions   polyethylene glycol packet Commonly known as:  MIRALAX / GLYCOLAX Take 17 g by mouth daily as needed.   psyllium 95 % Pack Commonly known as:  HYDROCIL/METAMUCIL Take 1 packet by mouth daily.   senna-docusate 8.6-50 MG tablet Commonly known as:  Senokot-S Take 1 tablet by mouth at bedtime. Start taking on:  06/13/2018   testosterone cypionate 200 MG/ML injection Commonly known as:  DEPOTESTOSTERONE CYPIONATE Inject 60 mg into the muscle every 6 (six) weeks. 3/10 of cc.   vitamin C 1000 MG tablet Take 1,000 mg by mouth every morning.   vitamin E 100 UNIT capsule Take 100 Units by mouth  every morning.      Follow-up Information    Mcarthur Rossetti, MD. Schedule an appointment as soon as possible for a visit in 2 week(s).   Specialty:  Orthopedic Surgery Contact information: [place] [address] [phone]          No Known Allergies  Consultations:  Orthopedics   Procedures/Studies: Ct Pelvis W Contrast  Result Date: 06/08/2018 CLINICAL DATA:  Increasing left leg pain x1 week. History of left hip arthroplasty. EXAM: CT PELVIS WITH CONTRAST TECHNIQUE: Multidetector CT imaging of the pelvis was performed using the standard protocol following the bolus administration of intravenous contrast. CONTRAST:  170mL ISOVUE-300 IOPAMIDOL (ISOVUE-300) INJECTION 61% COMPARISON:  None. FINDINGS: Urinary Tract: Partially included left sided ureteral stent is identified without calculus noted along side the stent. The distal coil of the stent is seen within the urinary bladder. No focal mural thickening of the  bladder is identified. No calculus is seen. Bowel: No acute bowel obstruction or inflammation. The included distal and terminal ileum are unremarkable. No inflammatory change bowel is identified. Vascular/Lymphatic: No pelvic lymphadenopathy. Aortoiliac atherosclerosis is noted with ectasia of the common iliac arteries bilaterally measuring up to 1.7 cm on the left and 1.6 cm on the right. Reproductive: The prostate is normal in size with TURP defect identified. Other:  Small periumbilical fat containing hernia. Musculoskeletal: Osteoarthritic joint space narrowing of the native right hip with bone-on-bone apposition of the weight-bearing portion of the femoral head with acetabular roof. Degenerative subchondral cystic change of the acetabular roof and femoral head are identified. Uncemented left hip arthroplasty is noted with subchondral cystic change along the lateral aspect of the acetabulum. Up to 3 mm of lucency noted between the uncemented  femoral component and native femur. No acute fractures identified. There is spondylosis of the included lumbar spine with marked disc flattening from L3 through S1 with associated facet arthrosis. Moderate-to-marked central canal stenosis is suggested at L3-4, partially included with ligamentum flavum calcifications in the right facet arthropathy. No suspicious osseous lesions or fracture. The sacral ala appear intact. The sacroiliac joints are maintained. No pelvic diastasis. Mild subcutaneous soft tissue edema overlying the gluteal muscles bilaterally. IMPRESSION: 1. Marked degenerative disc disease and facet arthropathy of the included lower lumbar spine from L3 through S1 with moderate-to-marked central canal stenosis suggested at L3-4, partially included. 2. Up to 3 mm of lucency noted about the femoral component of an uncemented left hip arthroplasty. Loosening is not entirely excluded. Subchondral cystic change of the left acetabular roof is also identified deep to the acetabular component. 3. 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 4. Aortoiliac atherosclerosis with ectasia of the common iliac arteries. 5. Left ureteral stent partially included as above. Electronically Signed   By: Ashley Royalty M.D.   On: 06/08/2018 14:09   Mr Lumbar Spine Wo Contrast  Result Date: 06/10/2018 CLINICAL DATA:  Low back pain radiating to LEFT lower extremity. EXAM: MRI LUMBAR SPINE WITHOUT CONTRAST TECHNIQUE: Multiplanar, multisequence MR imaging of the lumbar spine was performed. No intravenous contrast was administered. COMPARISON:  None. FINDINGS: Moderately motion degraded examination, per technologist, patient was in pain and had difficulty remaining still for examination. SEGMENTATION: For the purposes of this report, the last well-formed intervertebral disc is reported as L5-S1. ALIGNMENT: Maintained lumbar lordosis. Grade 1 L1-2 anterolisthesis and grade 1 L3-4 anterolisthesis. No definite spondylolysis  though limited by motion. VERTEBRAE:Vertebral bodies are intact. Severe disc height loss all lumbar levels with severe chronic discogenic endplate changes. Trace disc effusion L1-2 with generalized disc desiccation. Multilevel Schmorl's nodes, acute L2 inferior endplate Schmorl's node. CONUS MEDULLARIS AND CAUDA EQUINA: Conus medullaris terminates at L1 and demonstrates normal morphology and signal characteristics. Cauda equina is normal. PARASPINAL AND OTHER SOFT TISSUES: Nonacute. T2 bright cyst LEFT kidney incompletely evaluated. Moderate symmetric paraspinal muscle atrophy. DISC LEVELS: T12-L1: No disc bulge, canal stenosis nor neural foraminal narrowing. Mild facet arthropathy. L1-2: Anterolisthesis. Moderate to large broad-based disc bulge, mild facet arthropathy and ligamentum flavum redundancy. Severe canal stenosis. Moderate to severe RIGHT and mild LEFT neural foraminal narrowing. L2-3: Moderate broad-based disc bulge, moderate facet arthropathy and ligamentum flavum redundancy. Severe canal stenosis. Moderate RIGHT and mild LEFT neural foraminal narrowing. L3-4: Anterolisthesis. Small central disc protrusion, difficult to quantify due to patient motion and slice selection. Small broad-based disc bulge. At least moderate facet arthropathy and ligamentum flavum redundancy. Moderate canal stenosis. Moderate  RIGHT and moderate to severe LEFT neural foraminal narrowing. L4-5: Moderate broad-based disc osteophyte complex. Mild RIGHT and moderate to severe LEFT facet arthropathy, 6 mm LEFT facet synovial cyst within paraspinal soft tissues. No canal stenosis. Moderate RIGHT and moderate to severe LEFT neural foraminal narrowing. L5-S1: Moderate broad-based disc bulge. Moderate to severe facet arthropathy with trace facet effusions which are likely reactive. No canal stenosis. Severe RIGHT and moderate to severe LEFT neural foraminal narrowing. IMPRESSION: 1. Motion degraded examination. 2. No fracture. Grade 1  L1-2 and grade 1 L3-4 anterolisthesis without spondylolysis. 3. Degenerative change of the lumbar spine resulting in severe canal stenosis L1-2 and L2-3. Moderate canal stenosis L3-4. 4. Neural foraminal narrowing all lumbar levels: Severe on the RIGHT at L5-S1; moderate to severe appear on the LEFT from L3-4 through L5-S1. Electronically Signed   By: Elon Alas M.D.   On: 06/10/2018 14:06   US Renal  Result Date: 06/09/2018 CLINICAL DATA:  Acute kidney injury.  Left ureteral stent. EXAM: RENAL / URINARY TRACT ULTRASOUND COMPLETE COMPARISON:  None. FINDINGS: Right Kidney: Length: 10.7 cm. Echogenicity within normal limits. No mass or hydronephrosis visualized. Left Kidney: Length: 12.1 cm. Diffuse renal cortical thinning. Moderate hydronephrosis. Stent visualized within the intrarenal collecting system extending to the upper pole. No renal mass or stone. Bladder: Left stent visualized.  No bladder mass or stone. IMPRESSION: 1. Moderate left hydronephrosis and diffuse left renal cortical thinning. 2. Well-positioned left ureteral stent. 3. Normal appearance of the right kidney. Electronically Signed   By: Lajean Manes M.D.   On: 06/09/2018 15:34   Mr Hip Left Wo Contrast  Result Date: 06/10/2018 CLINICAL DATA:  Acute, intractable left hip pain. Prior arthroplasty 2 years ago. EXAM: MR OF THE LEFT HIP WITHOUT CONTRAST TECHNIQUE: Multiplanar, multisequence MR imaging was performed. No intravenous contrast was administered. COMPARISON:  CT pelvis dated June 08, 2018. FINDINGS: Despite efforts by the technologist and patient, motion artifact is present on today's exam and could not be eliminated. This reduces exam sensitivity and specificity. Bones: There is no evidence of acute fracture, dislocation or avascular necrosis. Prior left hip arthroplasty with suggestion of mildly increased T2 signal along the medial aspect of the distal femoral stem. The visualized sacroiliac joints and symphysis pubis  appear normal. Articular cartilage and labrum Articular cartilage: High-grade cartilage loss in the right hip joint with prominent subchondral cysts in the acetabulum. Labrum: There is no gross right labral tear or paralabral abnormality. Joint or bursal effusion Joint effusion: No significant hip joint effusion. Bursae: No focal periarticular fluid collection. Muscles and tendons Muscles and tendons: The visualized gluteus, hamstring and iliopsoas tendons appear normal. Mild diffuse muscle atrophy. No muscle edema. Other findings Miscellaneous: The visualized internal pelvic contents appear unremarkable. IMPRESSION: 1. Significantly motion degraded exam. Prior left hip arthroplasty with suggestion of mild osseous edema along the medial aspect of the distal femoral stem which could reflect stress related change/loosening. 2. Moderate to severe right hip osteoarthritis. Electronically Signed   By: Titus Dubin M.D.   On: 06/10/2018 15:10   Dg Chest Port 1 View  Result Date: 06/11/2018 CLINICAL DATA:  Unable to keep food or liquids down yesterday. No current abdominal or urinary complaints. History of aortic and tricuspid regurgitation. EXAM: PORTABLE CHEST 1 VIEW COMPARISON:  Chest x-ray of October 09, 2015 FINDINGS: The lungs are reasonably well expanded. There is no focal infiltrate. The interstitial markings are coarse though stable. There is no pleural effusion. The heart and pulmonary  vascularity are normal. The mediastinum is normal in width. There is mild tortuosity of the descending thoracic aorta. There are degenerative changes of the left shoulder. IMPRESSION: Stable chronic bronchitic changes. There is no active cardiopulmonary disease. Electronically Signed   By: David  Martinique M.D.   On: 06/11/2018 13:54   Ct Extremity Lower Left W Contrast  Result Date: 06/08/2018 CLINICAL DATA:  Increasing left leg pain x1 week. EXAM: CT OF THE LOWER LEFT EXTREMITY WITH CONTRAST TECHNIQUE: Multidetector CT  imaging of the lower left extremity was performed according to the standard protocol following intravenous contrast administration. COMPARISON:  CT abdomen pelvis 05/06/2014 CONTRAST:  132mL ISOVUE-300 IOPAMIDOL (ISOVUE-300) INJECTION 61% FINDINGS: Bones/Joint/Cartilage Uncemented left hip arthroplasty with up to 3 mm of lucency between the femoral component and femur. Loosening is not entirely excluded. Correlation is therefore recommended. Chronic stable subchondral cystic change of the left acetabular roof which can be seen on prior CT abdomen and pelvic study from 2015. No acute fracture bone destruction. No periosteal new bone formation. No frank bone destruction. Ligaments Suboptimally assessed by CT. Muscles and Tendons No soft tissue mass.  No muscle atrophy or hematoma. Soft tissues Mild subcutaneous soft tissue induration along the lateral aspect of the thigh and buttock. Left ureteral stent is noted without complicating features. IMPRESSION: 1. No acute osseous abnormality of the left femur. No CT evidence of acute osteomyelitis. 2. 3 mm lucency along the femoral component of a left hip arthroplasty. Loosening is not entirely excluded. 3. Degenerative subchondral cyst of the acetabular roof, pre-existing prior to hip arthroplasty. Electronically Signed   By: Ashley Royalty M.D.   On: 06/08/2018 14:16   Dg Hip Unilat W Or Wo Pelvis 2-3 Views Left  Result Date: 06/08/2018 CLINICAL DATA:  Pt from home and called EMS for increasing left leg pain x 1 week, and nausea. Hx of left hip arthroplasty. EXAM: DG HIP (WITH OR WITHOUT PELVIS) 2-3V LEFT COMPARISON:  05/19/2016 FINDINGS: No fracture.  No bone lesion. Left hip total arthroplasty appears well seated and aligned. No evidence of loosening. There are advanced arthropathic changes of the right hip. SI joints and symphysis pubis are normally aligned. Degenerative changes are noted of the visualized lower lumbar spine. Bones are demineralized. Partly imaged  left ureteral stent. IMPRESSION: 1. No fracture, bone lesion or evidence of malalignment/loosening of the left total hip arthroplasty. No acute finding. Electronically Signed   By: Lajean Manes M.D.   On: 06/08/2018 16:18       Subjective: Tired from poor sleep, but oriented.  No fever, no vomiting.  Appetite normal.  No chest pain, no change to left hip pain.  Discharge Exam: Vitals:   06/12/18 0707 06/12/18 0833  BP: (!) 168/84   Pulse:    Resp: 18   Temp: 98 F (36.7 C)   SpO2: 94% 94%   Vitals:   06/11/18 2215 06/12/18 0116 06/12/18 0707 06/12/18 0833  BP: (!) 159/72  (!) 168/84   Pulse: 79     Resp: 20  18   Temp: 98.5 F (36.9 C)  98 F (36.7 C)   TempSrc: Oral  Oral   SpO2: 96% 93% 94% 94%  Weight:      Height:        General: Pt is alert, awake, not in acute distress, sitting in bed. Cardiovascular: RRR, nl S1-S2, no murmurs appreciated.   No LE edema.   Respiratory: Normal respiratory rate and rhythm.  CTAB without rales or wheezes.  Abdominal: Abdomen soft and non-tender.  No distension or HSM.   Neuro/Psych: Strength symmetric in upper and lower extremities, globally very weak.  Judgment and insight appear slightly impaired, attention diminished, hard of hearing.   The results of significant diagnostics from this hospitalization (including imaging, microbiology, ancillary and laboratory) are listed below for reference.     Microbiology: Recent Results (from the past 240 hour(s))  Urine Culture     Status: Abnormal   Collection Time: 06/08/18  2:54 PM  Result Value Ref Range Status   Specimen Description   Final    URINE, RANDOM Performed at [facility] [address] [place], [place] [address]    Special Requests   Final    Normal Performed at [facility], [place] [address] [place],  [address]    Culture (A)  Final    80,000 COLONIES/mL PSEUDOMONAS AERUGINOSA >=100,000 COLONIES/mL ENTEROCOCCUS  FAECALIS    Report Status 06/11/2018 FINAL  Final   Organism ID, Bacteria PSEUDOMONAS AERUGINOSA (A)  Final   Organism ID, Bacteria ENTEROCOCCUS FAECALIS (A)  Final      Susceptibility   Enterococcus faecalis - MIC*    AMPICILLIN <=2 SENSITIVE Sensitive     LEVOFLOXACIN 1 SENSITIVE Sensitive     NITROFURANTOIN <=16 SENSITIVE Sensitive     VANCOMYCIN 1 SENSITIVE Sensitive     * >=100,000 COLONIES/mL ENTEROCOCCUS FAECALIS   Pseudomonas aeruginosa - MIC*    CEFTAZIDIME 2 SENSITIVE Sensitive     CIPROFLOXACIN <=0.25 SENSITIVE Sensitive     GENTAMICIN <=1 SENSITIVE Sensitive     IMIPENEM <=0.25 SENSITIVE Sensitive     PIP/TAZO <=4 SENSITIVE Sensitive     CEFEPIME <=1 SENSITIVE Sensitive     * 80,000 COLONIES/mL PSEUDOMONAS AERUGINOSA     Labs: BNP (last 3 results) No results for input(s): BNP in the last 8760 hours. Basic Metabolic Panel: Recent Labs  Lab 06/08/18 1201 06/09/18 0513 06/10/18 0539 06/11/18 1040  NA 122* 133* 135 132*  K 4.0 4.5 4.5 4.3  CL 86* 98 101 96*  CO2 26 27 25 28   GLUCOSE 121* 97 95 108*  BUN 12 15 15 13   CREATININE 1.28* 1.59* 1.61* 1.59*  CALCIUM 8.8* 8.7* 8.6* 8.6*   Liver Function Tests: Recent Labs  Lab 06/08/18 1201  AST 25  ALT 11  ALKPHOS 95  BILITOT 0.5  PROT 7.7  ALBUMIN 3.4*   No results for input(s): LIPASE, AMYLASE in the last 168 hours. No results for input(s): AMMONIA in the last 168 hours. CBC: Recent Labs  Lab 06/08/18 1201 06/09/18 0513  WBC 8.5 8.0  HGB 11.7* 11.9*  HCT 36.0* 37.4*  MCV 83.9 87.4  PLT 267 263   Cardiac Enzymes: No results for input(s): CKTOTAL, CKMB, CKMBINDEX, TROPONINI in the last 168 hours. BNP: Invalid input(s): POCBNP CBG: No results for input(s): GLUCAP in the last 168 hours. D-Dimer No results for input(s): DDIMER in the last 72 hours. Hgb A1c No results for input(s): HGBA1C in the last 72 hours. Lipid Profile No results for input(s): CHOL, HDL, LDLCALC, TRIG, CHOLHDL, LDLDIRECT  in the last 72 hours. Thyroid function studies No results for input(s): TSH, T4TOTAL, T3FREE, THYROIDAB in the last 72 hours.  Invalid input(s): FREET3 Anemia work up No results for input(s): VITAMINB12, FOLATE, FERRITIN, TIBC, IRON, RETICCTPCT in the last 72 hours. Urinalysis    Component Value Date/Time   COLORURINE YELLOW 06/08/2018 1155   APPEARANCEUR HAZY (A) 06/08/2018 1155   LABSPEC  1.008 06/08/2018 1155   PHURINE 8.0 06/08/2018 1155   GLUCOSEU NEGATIVE 06/08/2018 1155   HGBUR SMALL (A) 06/08/2018 1155   BILIRUBINUR NEGATIVE 06/08/2018 1155   [place] 06/08/2018 1155   PROTEINUR 30 (A) 06/08/2018 1155   UROBILINOGEN 0.2 05/06/2014 1815   NITRITE NEGATIVE 06/08/2018 1155   LEUKOCYTESUR LARGE (A) 06/08/2018 1155   Sepsis Labs Invalid input(s): PROCALCITONIN,  WBC,  LACTICIDVEN Microbiology Recent Results (from the past 240 hour(s))  Urine Culture     Status: Abnormal   Collection Time: 06/08/18  2:54 PM  Result Value Ref Range Status   Specimen Description   Final    URINE, RANDOM Performed at [facility], [place] [address], [place], [place] [address]    Special Requests   Final    Normal Performed at [facility], [address] [place], [place] [address]    Culture (A)  Final    80,000 COLONIES/mL PSEUDOMONAS AERUGINOSA >=100,000 COLONIES/mL ENTEROCOCCUS FAECALIS    Report Status 06/11/2018 FINAL  Final   Organism ID, Bacteria PSEUDOMONAS AERUGINOSA (A)  Final   Organism ID, Bacteria ENTEROCOCCUS FAECALIS (A)  Final      Susceptibility   Enterococcus faecalis - MIC*    AMPICILLIN <=2 SENSITIVE Sensitive     LEVOFLOXACIN 1 SENSITIVE Sensitive     NITROFURANTOIN <=16 SENSITIVE Sensitive     VANCOMYCIN 1 SENSITIVE Sensitive     * >=100,000 COLONIES/mL ENTEROCOCCUS FAECALIS   Pseudomonas aeruginosa - MIC*    CEFTAZIDIME 2 SENSITIVE Sensitive     CIPROFLOXACIN <=0.25 SENSITIVE Sensitive     GENTAMICIN <=1  SENSITIVE Sensitive     IMIPENEM <=0.25 SENSITIVE Sensitive     PIP/TAZO <=4 SENSITIVE Sensitive     CEFEPIME <=1 SENSITIVE Sensitive     * 80,000 COLONIES/mL PSEUDOMONAS AERUGINOSA     Time coordinating discharge: 50 minutes The [place] controlled substances registry was reviewed for this patient prior to filling the <5 days supply controlled substances script.      SIGNED:   Edwin Dada, MD  [facility] 06/12/2018, 10:57 AM

## 2018-06-13 DIAGNOSIS — E871 Hypo-osmolality and hyponatremia: Secondary | ICD-10-CM | POA: Diagnosis not present

## 2018-06-13 DIAGNOSIS — N390 Urinary tract infection, site not specified: Secondary | ICD-10-CM | POA: Diagnosis not present

## 2018-06-13 DIAGNOSIS — I10 Essential (primary) hypertension: Secondary | ICD-10-CM | POA: Diagnosis not present

## 2018-06-13 DIAGNOSIS — N183 Chronic kidney disease, stage 3 (moderate): Secondary | ICD-10-CM | POA: Diagnosis not present

## 2018-06-14 DIAGNOSIS — N183 Chronic kidney disease, stage 3 (moderate): Secondary | ICD-10-CM | POA: Diagnosis not present

## 2018-06-14 DIAGNOSIS — I10 Essential (primary) hypertension: Secondary | ICD-10-CM | POA: Diagnosis not present

## 2018-06-14 DIAGNOSIS — E871 Hypo-osmolality and hyponatremia: Secondary | ICD-10-CM | POA: Diagnosis not present

## 2018-06-14 DIAGNOSIS — M25552 Pain in left hip: Secondary | ICD-10-CM | POA: Diagnosis not present

## 2018-06-17 DIAGNOSIS — R2689 Other abnormalities of gait and mobility: Secondary | ICD-10-CM | POA: Diagnosis not present

## 2018-06-17 DIAGNOSIS — M25552 Pain in left hip: Secondary | ICD-10-CM | POA: Diagnosis not present

## 2018-06-18 DIAGNOSIS — N183 Chronic kidney disease, stage 3 (moderate): Secondary | ICD-10-CM | POA: Diagnosis not present

## 2018-06-18 DIAGNOSIS — N390 Urinary tract infection, site not specified: Secondary | ICD-10-CM | POA: Diagnosis not present

## 2018-06-18 DIAGNOSIS — E860 Dehydration: Secondary | ICD-10-CM | POA: Diagnosis not present

## 2018-06-19 ENCOUNTER — Encounter (INDEPENDENT_AMBULATORY_CARE_PROVIDER_SITE_OTHER): Payer: Self-pay | Admitting: Orthopaedic Surgery

## 2018-06-19 ENCOUNTER — Ambulatory Visit (INDEPENDENT_AMBULATORY_CARE_PROVIDER_SITE_OTHER): Payer: No Typology Code available for payment source | Admitting: Orthopaedic Surgery

## 2018-06-19 DIAGNOSIS — Z96642 Presence of left artificial hip joint: Secondary | ICD-10-CM

## 2018-06-19 DIAGNOSIS — M7062 Trochanteric bursitis, left hip: Secondary | ICD-10-CM | POA: Diagnosis not present

## 2018-06-19 MED ORDER — METHYLPREDNISOLONE ACETATE 40 MG/ML IJ SUSP
40.0000 mg | INTRAMUSCULAR | Status: AC | PRN
  Administered 2018-06-19: 40 mg via INTRA_ARTICULAR

## 2018-06-19 MED ORDER — LIDOCAINE HCL 1 % IJ SOLN
3.0000 mL | INTRAMUSCULAR | Status: AC | PRN
  Administered 2018-06-19: 3 mL

## 2018-06-19 NOTE — Progress Notes (Signed)
Office Visit Note   Patient: Steven Cain           Date of Birth: [birth year]           MRN: [id number] Visit Date: 06/19/2018              Requested by: Shon Baton, [address] [place], [place] [address] PCP: Shon Baton, MD   Assessment & Plan: Visit Diagnoses:  1. History of total left hip replacement   2. Trochanteric bursitis, left hip     Plan: I did provide a steroid injection around his left hip trochanteric area to see how he does overall with this.  He is agreeable to this injection and understands the risk and benefits behind it.  I would have therapy still work on mobility with weightbearing as tolerated.  I would like to see him back in 4 weeks at that visit I would like an AP and lateral of his left hip.  This can be done supine.  Follow-Up Instructions: Return in about 4 weeks (around 07/17/2018).   Orders:  Orders Placed This Encounter  Procedures  . Large Joint Inj   No orders of the defined types were placed in this encounter.     Procedures: Large Joint Inj: L greater trochanter on 06/19/2018 3:21 PM Indications: pain and diagnostic evaluation Details: 22 G 1.5 in needle, lateral approach  Arthrogram: No  Medications: 3 mL lidocaine 1 %; 40 mg methylPREDNISolone acetate 40 MG/ML Outcome: tolerated well, no immediate complications Procedure, treatment alternatives, risks and benefits explained, specific risks discussed. Consent was given by the patient. Immediately prior to procedure a time out was called to verify the correct patient, procedure, equipment, support staff and site/side marked as required. Patient was prepped and draped in the usual sterile fashion.       Clinical Data: No additional findings.   Subjective: Chief Complaint  Patient presents with  . Left Hip - Pain  The patient is only seen before.  He has been hospitalized recently due to hyponatremia.  He also is having significant left hip and left lower extremity pain.   We performed a left total hip arthroplasty on 2 years ago.  There is suggestion on plain films of prosthetic loosening.  He also has significant lumbar stenosis and an MRI of the left hip did not show any fluid collection around the hip showing infection.  He staying in a skilled nursing facility right now for rehabilitation.  He is 84 years old.  He still reports pain in his left hip and is been up with therapy and has some good days and bad days.  He points the trochanteric area as a source of his pain.  His wife is with him today.  HPI  Review of Systems He currently denies any shortness of breath or fever, chills, nausea, vomiting, chest pain  Objective: Vital Signs: There were no vitals taken for this visit.  Physical Exam He is alert and oriented and in no acute distress.  He follows exam appropriately. Ortho Exam On compression of his left hip as well as internal and external rotation of his left hip he has minimal pain.  He does have pain to palpation of the trochanteric area. Specialty Comments:  No specialty comments available.  Imaging: No results found.   PMFS History: Patient Active Problem List   Diagnosis Date Noted  . Pain in left hip   . Acute lower UTI 06/08/2018  . Nausea & vomiting  06/08/2018  . Syncope due to orthostatic hypotension 03/23/2017  . Hyponatremia 03/22/2017  . Essential hypertension 03/22/2017  . Osteoarthritis of left hip 05/19/2016  . History of total left hip replacement 05/19/2016  . CKD (chronic kidney disease) stage 3, GFR 30-59 ml/min (HCC) 09/27/2014  . Anemia associated with acute blood loss 09/27/2014  . Orthostasis 09/27/2014  . GI bleed 09/27/2014  . Acute pyelonephritis 05/08/2014    Class: Acute  . Hydronephrosis of left kidney 05/08/2014    Class: Acute  . Ureteral mass 05/08/2014    Class: Acute  . UTI (urinary tract infection) 04/24/2013  . Chills 04/24/2013   Past Medical History:  Diagnosis Date  . Anxiety   . AR  (aortic regurgitation) 03/23/2017   Mild, Noted on ECHO  . Arthritis    DJD  . Benign enlargement of prostate    frequent UTI, trim of prostate done , with some issues of incontinence now.  . Chronic bronchitis ([place])    chronic- usually has phelgm often  . Depression   . Dyspnea    with excertion  . Fluid retention in legs    occasional use of diuretic as needed  . GI bleed   . Grade I diastolic dysfunction   . Hard of hearing    hear more on left side- no hearing aid  . History of colon polyps   . History of transfusion   . Hydronephrosis 2018   Left  . Hypertension   . Moderate concentric left ventricular hypertrophy 03/23/2017   Noted on ECHO  . Perforated, severe stomach ulcer ([place])   . Peripheral neuropathy   . Spinal stenosis   . TR (tricuspid regurgitation) 03/23/2017   Mild, Noted on ECHO  . Transfusion history    none recent  . Ureteral stricture    CHRONIC  . Urinary incontinence   . UTI (urinary tract infection)    frequent  . Wears glasses     History reviewed. No pertinent family history.  Past Surgical History:  Procedure Laterality Date  . ABDOMINAL SURGERY     bilroths tube '74  . COLONOSCOPY N/A 09/29/2014   Procedure: COLONOSCOPY;  Surgeon: Beryle Beams, MD;  Location: [facility];  Service: Endoscopy;  Laterality: N/A;  . CYSTOSCOPY W/ URETERAL STENT PLACEMENT Left 05/06/2014   Procedure: CYSTOSCOPY WITH RETROGRADE PYELOGRAM/URETERAL STENT PLACEMENT;  Surgeon: [name], MD;  Location: [facility];  Service: Urology;  Laterality: Left;  . CYSTOSCOPY W/ URETERAL STENT PLACEMENT Left 03/31/2015   Procedure: CYSTOSCOPY WITH LEFT RETROGRADE PYELOGRAM/URETERAL STENT EXCHANGE;  Surgeon: [name], MD;  [facility];  Service: Urology;  Laterality: Left;  . CYSTOSCOPY W/ URETERAL STENT PLACEMENT Left 09/15/2015   Procedure: CYSTOSCOPY WITH LEFT RETROGRADE PYELOGRAM/URETERAL STENT EXCHANGE ;  Surgeon: [name], MD;  [facility];  Service:  Urology;  Laterality: Left;  . CYSTOSCOPY W/ URETERAL STENT PLACEMENT Left 04/28/2016   Procedure: CYSTOSCOPY WITH RETROGRADE PYELOGRAM/URETERAL STENT EXCHANGE;  Surgeon: [name], MD;  [facility];  Service: Urology;  Laterality: Left;  . CYSTOSCOPY W/ URETERAL STENT PLACEMENT Left 11/08/2016   Procedure: CYSTOSCOPY WITH RETROGRADE PYELOGRAM/URETERAL STENT EXCHANGE;  Surgeon: [name], MD;  [facility];  Service: Urology;  Laterality: Left;  . CYSTOSCOPY W/ URETERAL STENT PLACEMENT Left 09/14/2017   Procedure: CYSTOSCOPY WITH RETROGRADE PYELOGRAM/URETERAL STENT PLACEMENT;  Surgeon: [name], MD;  [facility];  Service: Urology;  Laterality: Left;  . CYSTOSCOPY W/ URETERAL STENT PLACEMENT Left 04/10/2018  Procedure: CYSTOSCOPY WITH LEFT RETROGRADE URETERAL STENT EXCHANGE;  Surgeon: [name], MD;  Location[facility];  Service: Urology;  Laterality: Left;  . CYSTOSCOPY WITH RETROGRADE PYELOGRAM, URETEROSCOPY AND STENT PLACEMENT Left 10/21/2014   Procedure: CYSTOSCOPY WITH RETROGRADE PYELOGRAM, URETEROSCOPY,BIOPSY AND STENT CHANGE;  Surgeon: [name], MD;  [facility];  Service: Urology;  Laterality: Left;  . CYSTOSCOPY/RETROGRADE/URETEROSCOPY Left 06/03/2014   Procedure: CYSTOSCOPY/RETROGRADE/URETEROSCOPY WITH BIOPSY,STENT EXCHANGE;  Surgeon: [name], MD;  [facility];  Service: Urology;  Laterality: Left;  . ESOPHAGOGASTRODUODENOSCOPY N/A 09/28/2014   Procedure: ESOPHAGOGASTRODUODENOSCOPY (EGD);  Surgeon: Beryle Beams, MD;  Location: [facility];  Service: Endoscopy;  Laterality: N/A;  . GASTRIC ROUX-EN-Y     '74  . HERNIA REPAIR     bilateral hernia repairs   . TONSILLECTOMY    . TOTAL HIP ARTHROPLASTY Left 05/19/2016   Procedure: LEFT TOTAL HIP ARTHROPLASTY ANTERIOR APPROACH;  Surgeon: Mcarthur Rossetti, MD;  [facility];  Service: Orthopedics;  Laterality: Left;  . TRANSURETHRAL RESECTION OF PROSTATE    .  VAGOTOMY     Social History   Occupational History  . Not on file  Tobacco Use  . Smoking status: Never Smoker  . Smokeless tobacco: Former Systems developer    Types: Chew  Substance and Sexual Activity  . Alcohol use: Not Currently  . Drug use: No  . Sexual activity: Not Currently

## 2018-06-20 DIAGNOSIS — M25552 Pain in left hip: Secondary | ICD-10-CM | POA: Diagnosis not present

## 2018-06-20 DIAGNOSIS — R2689 Other abnormalities of gait and mobility: Secondary | ICD-10-CM | POA: Diagnosis not present

## 2018-06-21 DIAGNOSIS — N183 Chronic kidney disease, stage 3 (moderate): Secondary | ICD-10-CM | POA: Diagnosis not present

## 2018-06-21 DIAGNOSIS — I10 Essential (primary) hypertension: Secondary | ICD-10-CM | POA: Diagnosis not present

## 2018-06-21 DIAGNOSIS — M48061 Spinal stenosis, lumbar region without neurogenic claudication: Secondary | ICD-10-CM | POA: Diagnosis not present

## 2018-06-24 DIAGNOSIS — R2689 Other abnormalities of gait and mobility: Secondary | ICD-10-CM | POA: Diagnosis not present

## 2018-06-24 DIAGNOSIS — I10 Essential (primary) hypertension: Secondary | ICD-10-CM | POA: Diagnosis not present

## 2018-06-24 DIAGNOSIS — M48061 Spinal stenosis, lumbar region without neurogenic claudication: Secondary | ICD-10-CM | POA: Diagnosis not present

## 2018-06-24 DIAGNOSIS — M25552 Pain in left hip: Secondary | ICD-10-CM | POA: Diagnosis not present

## 2018-06-24 DIAGNOSIS — N183 Chronic kidney disease, stage 3 (moderate): Secondary | ICD-10-CM | POA: Diagnosis not present

## 2018-06-28 DIAGNOSIS — E871 Hypo-osmolality and hyponatremia: Secondary | ICD-10-CM | POA: Diagnosis not present

## 2018-06-28 DIAGNOSIS — N183 Chronic kidney disease, stage 3 (moderate): Secondary | ICD-10-CM | POA: Diagnosis not present

## 2018-06-28 DIAGNOSIS — M25552 Pain in left hip: Secondary | ICD-10-CM | POA: Diagnosis not present

## 2018-06-28 DIAGNOSIS — I10 Essential (primary) hypertension: Secondary | ICD-10-CM | POA: Diagnosis not present

## 2018-06-28 DIAGNOSIS — M48061 Spinal stenosis, lumbar region without neurogenic claudication: Secondary | ICD-10-CM | POA: Diagnosis not present

## 2018-06-28 DIAGNOSIS — R2689 Other abnormalities of gait and mobility: Secondary | ICD-10-CM | POA: Diagnosis not present

## 2018-07-05 DIAGNOSIS — R2689 Other abnormalities of gait and mobility: Secondary | ICD-10-CM | POA: Diagnosis not present

## 2018-07-05 DIAGNOSIS — M25552 Pain in left hip: Secondary | ICD-10-CM | POA: Diagnosis not present

## 2018-07-15 DIAGNOSIS — M25552 Pain in left hip: Secondary | ICD-10-CM | POA: Diagnosis not present

## 2018-07-15 DIAGNOSIS — E871 Hypo-osmolality and hyponatremia: Secondary | ICD-10-CM | POA: Diagnosis not present

## 2018-07-15 DIAGNOSIS — I10 Essential (primary) hypertension: Secondary | ICD-10-CM | POA: Diagnosis not present

## 2018-07-15 DIAGNOSIS — N183 Chronic kidney disease, stage 3 (moderate): Secondary | ICD-10-CM | POA: Diagnosis not present

## 2018-07-17 ENCOUNTER — Encounter (INDEPENDENT_AMBULATORY_CARE_PROVIDER_SITE_OTHER): Payer: Self-pay | Admitting: Orthopaedic Surgery

## 2018-07-17 ENCOUNTER — Ambulatory Visit (INDEPENDENT_AMBULATORY_CARE_PROVIDER_SITE_OTHER): Payer: No Typology Code available for payment source

## 2018-07-17 ENCOUNTER — Ambulatory Visit (INDEPENDENT_AMBULATORY_CARE_PROVIDER_SITE_OTHER): Payer: Medicare Other | Admitting: Orthopaedic Surgery

## 2018-07-17 DIAGNOSIS — M25552 Pain in left hip: Secondary | ICD-10-CM | POA: Diagnosis not present

## 2018-07-17 DIAGNOSIS — Z96642 Presence of left artificial hip joint: Secondary | ICD-10-CM | POA: Diagnosis not present

## 2018-07-17 NOTE — Progress Notes (Signed)
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  He states that the trochanteric injection did not help.  He does find physical therapy and light chain patches to be beneficial.  His lateral hip pain has diminished some.  States he is still better than he was prior to surgery.  He is working with physical therapy on range of motion strengthening.  Physical exam: Left hip good range of motion without significant pain.  Tenderness over the left trochanteric region.  Radiographs: AP pelvis and lateral view of the left hip: No acute fracture.  No hardware failure.  No change in overall position of the hardware.  There is no evidence of significant periosteal reaction.  Hips well located.  Impression status post: Left total hip arthroplasty 2 years Left hip trochanteric pain  Plan: Patient will benefit from continued physical therapy at the skilled facility to work on range of motion strengthening tissue massage.  Follow-up with [place] on as-needed basis.  Recommend continued lidocaine patches to the area of most discomfort in the lateral aspect of the left hip.  Questions encouraged and answered at length by Dr. Ninfa Linden and myself.

## 2018-07-19 DIAGNOSIS — N183 Chronic kidney disease, stage 3 (moderate): Secondary | ICD-10-CM | POA: Diagnosis not present

## 2018-07-19 DIAGNOSIS — M48061 Spinal stenosis, lumbar region without neurogenic claudication: Secondary | ICD-10-CM | POA: Diagnosis not present

## 2018-07-19 DIAGNOSIS — I10 Essential (primary) hypertension: Secondary | ICD-10-CM | POA: Diagnosis not present

## 2018-07-19 DIAGNOSIS — M25552 Pain in left hip: Secondary | ICD-10-CM | POA: Diagnosis not present

## 2018-07-24 DIAGNOSIS — F329 Major depressive disorder, single episode, unspecified: Secondary | ICD-10-CM | POA: Diagnosis not present

## 2018-07-24 DIAGNOSIS — M48061 Spinal stenosis, lumbar region without neurogenic claudication: Secondary | ICD-10-CM | POA: Diagnosis not present

## 2018-07-24 DIAGNOSIS — M25552 Pain in left hip: Secondary | ICD-10-CM | POA: Diagnosis not present

## 2018-07-24 DIAGNOSIS — I10 Essential (primary) hypertension: Secondary | ICD-10-CM | POA: Diagnosis not present

## 2018-08-01 DIAGNOSIS — N183 Chronic kidney disease, stage 3 (moderate): Secondary | ICD-10-CM | POA: Diagnosis not present

## 2018-08-01 DIAGNOSIS — I10 Essential (primary) hypertension: Secondary | ICD-10-CM | POA: Diagnosis not present

## 2018-08-01 DIAGNOSIS — E871 Hypo-osmolality and hyponatremia: Secondary | ICD-10-CM | POA: Diagnosis not present

## 2018-08-01 DIAGNOSIS — N390 Urinary tract infection, site not specified: Secondary | ICD-10-CM | POA: Diagnosis not present

## 2018-08-06 DIAGNOSIS — N183 Chronic kidney disease, stage 3 (moderate): Secondary | ICD-10-CM | POA: Diagnosis not present

## 2018-08-06 DIAGNOSIS — M5137 Other intervertebral disc degeneration, lumbosacral region: Secondary | ICD-10-CM | POA: Diagnosis not present

## 2018-08-06 DIAGNOSIS — I951 Orthostatic hypotension: Secondary | ICD-10-CM | POA: Diagnosis not present

## 2018-08-06 DIAGNOSIS — I129 Hypertensive chronic kidney disease with stage 1 through stage 4 chronic kidney disease, or unspecified chronic kidney disease: Secondary | ICD-10-CM | POA: Diagnosis not present

## 2018-08-06 DIAGNOSIS — M4800 Spinal stenosis, site unspecified: Secondary | ICD-10-CM | POA: Diagnosis not present

## 2018-08-06 DIAGNOSIS — F3289 Other specified depressive episodes: Secondary | ICD-10-CM | POA: Diagnosis not present

## 2018-08-06 DIAGNOSIS — F329 Major depressive disorder, single episode, unspecified: Secondary | ICD-10-CM | POA: Diagnosis not present

## 2018-08-06 DIAGNOSIS — M1611 Unilateral primary osteoarthritis, right hip: Secondary | ICD-10-CM | POA: Diagnosis not present

## 2018-08-06 DIAGNOSIS — M48061 Spinal stenosis, lumbar region without neurogenic claudication: Secondary | ICD-10-CM | POA: Diagnosis not present

## 2018-08-06 DIAGNOSIS — F411 Generalized anxiety disorder: Secondary | ICD-10-CM | POA: Diagnosis not present

## 2018-08-06 DIAGNOSIS — N10 Acute tubulo-interstitial nephritis: Secondary | ICD-10-CM | POA: Diagnosis not present

## 2018-08-06 DIAGNOSIS — M47816 Spondylosis without myelopathy or radiculopathy, lumbar region: Secondary | ICD-10-CM | POA: Diagnosis not present

## 2018-08-09 DIAGNOSIS — F329 Major depressive disorder, single episode, unspecified: Secondary | ICD-10-CM | POA: Diagnosis not present

## 2018-08-09 DIAGNOSIS — M5137 Other intervertebral disc degeneration, lumbosacral region: Secondary | ICD-10-CM | POA: Diagnosis not present

## 2018-08-09 DIAGNOSIS — N10 Acute tubulo-interstitial nephritis: Secondary | ICD-10-CM | POA: Diagnosis not present

## 2018-08-09 DIAGNOSIS — M48061 Spinal stenosis, lumbar region without neurogenic claudication: Secondary | ICD-10-CM | POA: Diagnosis not present

## 2018-08-09 DIAGNOSIS — N183 Chronic kidney disease, stage 3 (moderate): Secondary | ICD-10-CM | POA: Diagnosis not present

## 2018-08-09 DIAGNOSIS — I129 Hypertensive chronic kidney disease with stage 1 through stage 4 chronic kidney disease, or unspecified chronic kidney disease: Secondary | ICD-10-CM | POA: Diagnosis not present

## 2018-08-13 DIAGNOSIS — N10 Acute tubulo-interstitial nephritis: Secondary | ICD-10-CM | POA: Diagnosis not present

## 2018-08-13 DIAGNOSIS — N183 Chronic kidney disease, stage 3 (moderate): Secondary | ICD-10-CM | POA: Diagnosis not present

## 2018-08-13 DIAGNOSIS — F329 Major depressive disorder, single episode, unspecified: Secondary | ICD-10-CM | POA: Diagnosis not present

## 2018-08-13 DIAGNOSIS — M48061 Spinal stenosis, lumbar region without neurogenic claudication: Secondary | ICD-10-CM | POA: Diagnosis not present

## 2018-08-13 DIAGNOSIS — M5137 Other intervertebral disc degeneration, lumbosacral region: Secondary | ICD-10-CM | POA: Diagnosis not present

## 2018-08-13 DIAGNOSIS — I129 Hypertensive chronic kidney disease with stage 1 through stage 4 chronic kidney disease, or unspecified chronic kidney disease: Secondary | ICD-10-CM | POA: Diagnosis not present

## 2018-08-15 DIAGNOSIS — N183 Chronic kidney disease, stage 3 (moderate): Secondary | ICD-10-CM | POA: Diagnosis not present

## 2018-08-15 DIAGNOSIS — M5137 Other intervertebral disc degeneration, lumbosacral region: Secondary | ICD-10-CM | POA: Diagnosis not present

## 2018-08-15 DIAGNOSIS — N10 Acute tubulo-interstitial nephritis: Secondary | ICD-10-CM | POA: Diagnosis not present

## 2018-08-15 DIAGNOSIS — F329 Major depressive disorder, single episode, unspecified: Secondary | ICD-10-CM | POA: Diagnosis not present

## 2018-08-15 DIAGNOSIS — M48061 Spinal stenosis, lumbar region without neurogenic claudication: Secondary | ICD-10-CM | POA: Diagnosis not present

## 2018-08-15 DIAGNOSIS — I129 Hypertensive chronic kidney disease with stage 1 through stage 4 chronic kidney disease, or unspecified chronic kidney disease: Secondary | ICD-10-CM | POA: Diagnosis not present

## 2018-08-16 DIAGNOSIS — E538 Deficiency of other specified B group vitamins: Secondary | ICD-10-CM | POA: Diagnosis not present

## 2018-08-16 DIAGNOSIS — I129 Hypertensive chronic kidney disease with stage 1 through stage 4 chronic kidney disease, or unspecified chronic kidney disease: Secondary | ICD-10-CM | POA: Diagnosis not present

## 2018-08-16 DIAGNOSIS — J42 Unspecified chronic bronchitis: Secondary | ICD-10-CM | POA: Diagnosis not present

## 2018-08-16 DIAGNOSIS — Z6832 Body mass index (BMI) 32.0-32.9, adult: Secondary | ICD-10-CM | POA: Diagnosis not present

## 2018-08-16 DIAGNOSIS — R2689 Other abnormalities of gait and mobility: Secondary | ICD-10-CM | POA: Diagnosis not present

## 2018-08-16 DIAGNOSIS — N390 Urinary tract infection, site not specified: Secondary | ICD-10-CM | POA: Diagnosis not present

## 2018-08-16 DIAGNOSIS — R600 Localized edema: Secondary | ICD-10-CM | POA: Diagnosis not present

## 2018-08-16 DIAGNOSIS — E871 Hypo-osmolality and hyponatremia: Secondary | ICD-10-CM | POA: Diagnosis not present

## 2018-08-16 DIAGNOSIS — I878 Other specified disorders of veins: Secondary | ICD-10-CM | POA: Diagnosis not present

## 2018-08-16 DIAGNOSIS — M1990 Unspecified osteoarthritis, unspecified site: Secondary | ICD-10-CM | POA: Diagnosis not present

## 2018-08-19 DIAGNOSIS — N183 Chronic kidney disease, stage 3 (moderate): Secondary | ICD-10-CM | POA: Diagnosis not present

## 2018-08-19 DIAGNOSIS — N10 Acute tubulo-interstitial nephritis: Secondary | ICD-10-CM | POA: Diagnosis not present

## 2018-08-19 DIAGNOSIS — M5137 Other intervertebral disc degeneration, lumbosacral region: Secondary | ICD-10-CM | POA: Diagnosis not present

## 2018-08-19 DIAGNOSIS — M48061 Spinal stenosis, lumbar region without neurogenic claudication: Secondary | ICD-10-CM | POA: Diagnosis not present

## 2018-08-19 DIAGNOSIS — F329 Major depressive disorder, single episode, unspecified: Secondary | ICD-10-CM | POA: Diagnosis not present

## 2018-08-19 DIAGNOSIS — I129 Hypertensive chronic kidney disease with stage 1 through stage 4 chronic kidney disease, or unspecified chronic kidney disease: Secondary | ICD-10-CM | POA: Diagnosis not present

## 2018-08-22 DIAGNOSIS — M48061 Spinal stenosis, lumbar region without neurogenic claudication: Secondary | ICD-10-CM | POA: Diagnosis not present

## 2018-08-22 DIAGNOSIS — M5137 Other intervertebral disc degeneration, lumbosacral region: Secondary | ICD-10-CM | POA: Diagnosis not present

## 2018-08-22 DIAGNOSIS — F329 Major depressive disorder, single episode, unspecified: Secondary | ICD-10-CM | POA: Diagnosis not present

## 2018-08-22 DIAGNOSIS — N183 Chronic kidney disease, stage 3 (moderate): Secondary | ICD-10-CM | POA: Diagnosis not present

## 2018-08-22 DIAGNOSIS — N10 Acute tubulo-interstitial nephritis: Secondary | ICD-10-CM | POA: Diagnosis not present

## 2018-08-22 DIAGNOSIS — I129 Hypertensive chronic kidney disease with stage 1 through stage 4 chronic kidney disease, or unspecified chronic kidney disease: Secondary | ICD-10-CM | POA: Diagnosis not present

## 2018-08-23 DIAGNOSIS — I129 Hypertensive chronic kidney disease with stage 1 through stage 4 chronic kidney disease, or unspecified chronic kidney disease: Secondary | ICD-10-CM | POA: Diagnosis not present

## 2018-08-23 DIAGNOSIS — N10 Acute tubulo-interstitial nephritis: Secondary | ICD-10-CM | POA: Diagnosis not present

## 2018-08-23 DIAGNOSIS — M5137 Other intervertebral disc degeneration, lumbosacral region: Secondary | ICD-10-CM | POA: Diagnosis not present

## 2018-08-23 DIAGNOSIS — N183 Chronic kidney disease, stage 3 (moderate): Secondary | ICD-10-CM | POA: Diagnosis not present

## 2018-08-23 DIAGNOSIS — M48061 Spinal stenosis, lumbar region without neurogenic claudication: Secondary | ICD-10-CM | POA: Diagnosis not present

## 2018-08-23 DIAGNOSIS — F329 Major depressive disorder, single episode, unspecified: Secondary | ICD-10-CM | POA: Diagnosis not present

## 2018-08-26 DIAGNOSIS — I129 Hypertensive chronic kidney disease with stage 1 through stage 4 chronic kidney disease, or unspecified chronic kidney disease: Secondary | ICD-10-CM | POA: Diagnosis not present

## 2018-08-26 DIAGNOSIS — M5137 Other intervertebral disc degeneration, lumbosacral region: Secondary | ICD-10-CM | POA: Diagnosis not present

## 2018-08-26 DIAGNOSIS — N10 Acute tubulo-interstitial nephritis: Secondary | ICD-10-CM | POA: Diagnosis not present

## 2018-08-26 DIAGNOSIS — N183 Chronic kidney disease, stage 3 (moderate): Secondary | ICD-10-CM | POA: Diagnosis not present

## 2018-08-26 DIAGNOSIS — F329 Major depressive disorder, single episode, unspecified: Secondary | ICD-10-CM | POA: Diagnosis not present

## 2018-08-26 DIAGNOSIS — M48061 Spinal stenosis, lumbar region without neurogenic claudication: Secondary | ICD-10-CM | POA: Diagnosis not present

## 2018-08-28 DIAGNOSIS — M5137 Other intervertebral disc degeneration, lumbosacral region: Secondary | ICD-10-CM | POA: Diagnosis not present

## 2018-08-28 DIAGNOSIS — M48061 Spinal stenosis, lumbar region without neurogenic claudication: Secondary | ICD-10-CM | POA: Diagnosis not present

## 2018-08-28 DIAGNOSIS — F329 Major depressive disorder, single episode, unspecified: Secondary | ICD-10-CM | POA: Diagnosis not present

## 2018-08-28 DIAGNOSIS — N10 Acute tubulo-interstitial nephritis: Secondary | ICD-10-CM | POA: Diagnosis not present

## 2018-08-28 DIAGNOSIS — I129 Hypertensive chronic kidney disease with stage 1 through stage 4 chronic kidney disease, or unspecified chronic kidney disease: Secondary | ICD-10-CM | POA: Diagnosis not present

## 2018-08-28 DIAGNOSIS — N183 Chronic kidney disease, stage 3 (moderate): Secondary | ICD-10-CM | POA: Diagnosis not present

## 2018-08-30 DIAGNOSIS — N10 Acute tubulo-interstitial nephritis: Secondary | ICD-10-CM | POA: Diagnosis not present

## 2018-08-30 DIAGNOSIS — I129 Hypertensive chronic kidney disease with stage 1 through stage 4 chronic kidney disease, or unspecified chronic kidney disease: Secondary | ICD-10-CM | POA: Diagnosis not present

## 2018-08-30 DIAGNOSIS — M48061 Spinal stenosis, lumbar region without neurogenic claudication: Secondary | ICD-10-CM | POA: Diagnosis not present

## 2018-08-30 DIAGNOSIS — F329 Major depressive disorder, single episode, unspecified: Secondary | ICD-10-CM | POA: Diagnosis not present

## 2018-08-30 DIAGNOSIS — N183 Chronic kidney disease, stage 3 (moderate): Secondary | ICD-10-CM | POA: Diagnosis not present

## 2018-08-30 DIAGNOSIS — M5137 Other intervertebral disc degeneration, lumbosacral region: Secondary | ICD-10-CM | POA: Diagnosis not present

## 2018-09-02 DIAGNOSIS — F329 Major depressive disorder, single episode, unspecified: Secondary | ICD-10-CM | POA: Diagnosis not present

## 2018-09-02 DIAGNOSIS — M5137 Other intervertebral disc degeneration, lumbosacral region: Secondary | ICD-10-CM | POA: Diagnosis not present

## 2018-09-02 DIAGNOSIS — N183 Chronic kidney disease, stage 3 (moderate): Secondary | ICD-10-CM | POA: Diagnosis not present

## 2018-09-02 DIAGNOSIS — N10 Acute tubulo-interstitial nephritis: Secondary | ICD-10-CM | POA: Diagnosis not present

## 2018-09-02 DIAGNOSIS — M48061 Spinal stenosis, lumbar region without neurogenic claudication: Secondary | ICD-10-CM | POA: Diagnosis not present

## 2018-09-02 DIAGNOSIS — I129 Hypertensive chronic kidney disease with stage 1 through stage 4 chronic kidney disease, or unspecified chronic kidney disease: Secondary | ICD-10-CM | POA: Diagnosis not present

## 2018-09-04 DIAGNOSIS — I129 Hypertensive chronic kidney disease with stage 1 through stage 4 chronic kidney disease, or unspecified chronic kidney disease: Secondary | ICD-10-CM | POA: Diagnosis not present

## 2018-09-04 DIAGNOSIS — M48061 Spinal stenosis, lumbar region without neurogenic claudication: Secondary | ICD-10-CM | POA: Diagnosis not present

## 2018-09-04 DIAGNOSIS — N10 Acute tubulo-interstitial nephritis: Secondary | ICD-10-CM | POA: Diagnosis not present

## 2018-09-04 DIAGNOSIS — N183 Chronic kidney disease, stage 3 (moderate): Secondary | ICD-10-CM | POA: Diagnosis not present

## 2018-09-04 DIAGNOSIS — M5137 Other intervertebral disc degeneration, lumbosacral region: Secondary | ICD-10-CM | POA: Diagnosis not present

## 2018-09-04 DIAGNOSIS — F329 Major depressive disorder, single episode, unspecified: Secondary | ICD-10-CM | POA: Diagnosis not present

## 2018-09-05 DIAGNOSIS — M48061 Spinal stenosis, lumbar region without neurogenic claudication: Secondary | ICD-10-CM | POA: Diagnosis not present

## 2018-09-05 DIAGNOSIS — N10 Acute tubulo-interstitial nephritis: Secondary | ICD-10-CM | POA: Diagnosis not present

## 2018-09-05 DIAGNOSIS — I129 Hypertensive chronic kidney disease with stage 1 through stage 4 chronic kidney disease, or unspecified chronic kidney disease: Secondary | ICD-10-CM | POA: Diagnosis not present

## 2018-09-05 DIAGNOSIS — F329 Major depressive disorder, single episode, unspecified: Secondary | ICD-10-CM | POA: Diagnosis not present

## 2018-09-05 DIAGNOSIS — N183 Chronic kidney disease, stage 3 (moderate): Secondary | ICD-10-CM | POA: Diagnosis not present

## 2018-09-05 DIAGNOSIS — M5137 Other intervertebral disc degeneration, lumbosacral region: Secondary | ICD-10-CM | POA: Diagnosis not present

## 2018-09-06 DIAGNOSIS — N10 Acute tubulo-interstitial nephritis: Secondary | ICD-10-CM | POA: Diagnosis not present

## 2018-09-06 DIAGNOSIS — F329 Major depressive disorder, single episode, unspecified: Secondary | ICD-10-CM | POA: Diagnosis not present

## 2018-09-06 DIAGNOSIS — N183 Chronic kidney disease, stage 3 (moderate): Secondary | ICD-10-CM | POA: Diagnosis not present

## 2018-09-06 DIAGNOSIS — M48061 Spinal stenosis, lumbar region without neurogenic claudication: Secondary | ICD-10-CM | POA: Diagnosis not present

## 2018-09-06 DIAGNOSIS — M5137 Other intervertebral disc degeneration, lumbosacral region: Secondary | ICD-10-CM | POA: Diagnosis not present

## 2018-09-06 DIAGNOSIS — I129 Hypertensive chronic kidney disease with stage 1 through stage 4 chronic kidney disease, or unspecified chronic kidney disease: Secondary | ICD-10-CM | POA: Diagnosis not present

## 2018-09-09 DIAGNOSIS — N10 Acute tubulo-interstitial nephritis: Secondary | ICD-10-CM | POA: Diagnosis not present

## 2018-09-09 DIAGNOSIS — M48061 Spinal stenosis, lumbar region without neurogenic claudication: Secondary | ICD-10-CM | POA: Diagnosis not present

## 2018-09-09 DIAGNOSIS — M5137 Other intervertebral disc degeneration, lumbosacral region: Secondary | ICD-10-CM | POA: Diagnosis not present

## 2018-09-09 DIAGNOSIS — I129 Hypertensive chronic kidney disease with stage 1 through stage 4 chronic kidney disease, or unspecified chronic kidney disease: Secondary | ICD-10-CM | POA: Diagnosis not present

## 2018-09-09 DIAGNOSIS — N183 Chronic kidney disease, stage 3 (moderate): Secondary | ICD-10-CM | POA: Diagnosis not present

## 2018-09-09 DIAGNOSIS — F329 Major depressive disorder, single episode, unspecified: Secondary | ICD-10-CM | POA: Diagnosis not present

## 2018-09-24 DIAGNOSIS — I10 Essential (primary) hypertension: Secondary | ICD-10-CM | POA: Diagnosis not present

## 2018-09-24 DIAGNOSIS — M859 Disorder of bone density and structure, unspecified: Secondary | ICD-10-CM | POA: Diagnosis not present

## 2018-09-24 DIAGNOSIS — Z125 Encounter for screening for malignant neoplasm of prostate: Secondary | ICD-10-CM | POA: Diagnosis not present

## 2018-09-24 DIAGNOSIS — E538 Deficiency of other specified B group vitamins: Secondary | ICD-10-CM | POA: Diagnosis not present

## 2018-09-24 DIAGNOSIS — R7309 Other abnormal glucose: Secondary | ICD-10-CM | POA: Diagnosis not present

## 2018-09-24 DIAGNOSIS — R82998 Other abnormal findings in urine: Secondary | ICD-10-CM | POA: Diagnosis not present

## 2018-10-01 DIAGNOSIS — N183 Chronic kidney disease, stage 3 (moderate): Secondary | ICD-10-CM | POA: Diagnosis not present

## 2018-10-01 DIAGNOSIS — E291 Testicular hypofunction: Secondary | ICD-10-CM | POA: Diagnosis not present

## 2018-10-01 DIAGNOSIS — E538 Deficiency of other specified B group vitamins: Secondary | ICD-10-CM | POA: Diagnosis not present

## 2018-10-01 DIAGNOSIS — R739 Hyperglycemia, unspecified: Secondary | ICD-10-CM | POA: Diagnosis not present

## 2018-10-01 DIAGNOSIS — D692 Other nonthrombocytopenic purpura: Secondary | ICD-10-CM | POA: Diagnosis not present

## 2018-10-01 DIAGNOSIS — F325 Major depressive disorder, single episode, in full remission: Secondary | ICD-10-CM | POA: Diagnosis not present

## 2018-10-01 DIAGNOSIS — J42 Unspecified chronic bronchitis: Secondary | ICD-10-CM | POA: Diagnosis not present

## 2018-10-01 DIAGNOSIS — E871 Hypo-osmolality and hyponatremia: Secondary | ICD-10-CM | POA: Diagnosis not present

## 2018-10-01 DIAGNOSIS — N1330 Unspecified hydronephrosis: Secondary | ICD-10-CM | POA: Diagnosis not present

## 2018-10-01 DIAGNOSIS — Z0000 Encounter for general adult medical examination without abnormal findings: Secondary | ICD-10-CM | POA: Diagnosis not present

## 2018-10-01 DIAGNOSIS — I129 Hypertensive chronic kidney disease with stage 1 through stage 4 chronic kidney disease, or unspecified chronic kidney disease: Secondary | ICD-10-CM | POA: Diagnosis not present

## 2018-10-01 DIAGNOSIS — N401 Enlarged prostate with lower urinary tract symptoms: Secondary | ICD-10-CM | POA: Diagnosis not present

## 2018-10-04 DIAGNOSIS — Z1212 Encounter for screening for malignant neoplasm of rectum: Secondary | ICD-10-CM | POA: Diagnosis not present

## 2018-11-28 DIAGNOSIS — N3941 Urge incontinence: Secondary | ICD-10-CM | POA: Diagnosis not present

## 2018-11-28 DIAGNOSIS — N135 Crossing vessel and stricture of ureter without hydronephrosis: Secondary | ICD-10-CM | POA: Diagnosis not present

## 2018-11-29 ENCOUNTER — Other Ambulatory Visit: Payer: Self-pay | Admitting: Urology

## 2018-12-25 ENCOUNTER — Other Ambulatory Visit: Payer: Self-pay | Admitting: Urology

## 2019-01-27 NOTE — Patient Instructions (Addendum)
Steven Cain    Your procedure is scheduled on: 01-31-2019   Report to [facility] Main  Entrance    Report to admitting at 900 AM   [place] [facility] TEST ON 01-27-19 @11 :!5 AM_, THIS TEST MUST BE DONE BEFORE SURGERY, COME TO [place] ENTRANCE. ONCE YOUR COVID TEST IS COMPLETED, PLEASE BEGIN THE QUARANTINE INSTRUCTIONS AS OUTLINED IN YOUR HANDOUT.   Call this number if you have problems the morning of surgery [phone]    Remember: Do not eat food or drink liquids :After Midnight.     Take these medicines the morning of surgery with A SIP OF WATER:AMLODIPINE ([place]), YOU MAY BRING AND USE YOUR ALBUTEROL INHALER     BRUSH YOUR TEETH MORNING OF SURGERY AND RINSE YOUR MOUTH OUT, NO CHEWING GUM CANDY OR MINTS.                                You may not have any metal on your body including hair pins and              piercings     Do not wear jewelry, cologne, lotions, powders or deodorant                         Men may shave face and neck.   Do not bring valuables to the hospital. [place].  Contacts, dentures or bridgework may not be worn into surgery.     Patients discharged the day of surgery will not be allowed to drive home. IF YOU ARE HAVING SURGERY AND GOING HOME THE SAME DAY, YOU MUST HAVE AN ADULT TO DRIVE YOU HOME AND BE WITH YOU FOR 24 HOURS. YOU MAY GO HOME BY TAXI OR UBER OR ORTHERWISE, BUT AN ADULT MUST ACCOMPANY YOU HOME AND STAY WITH YOU FOR 24 HOURS.    Name and phone number of your driver: Steven Cain [phone]  Special Instructions: N/A              Please read over the following fact sheets you were given: _____________________________________________________________________             [facility] - Preparing for Surgery Before surgery, you can play an important role.  Because skin is not sterile, your skin needs to be as free of  germs as possible.  You can reduce the number of germs on your skin by washing with CHG (chlorahexidine gluconate) soap before surgery.  CHG is an antiseptic cleaner which kills germs and bonds with the skin to continue killing germs even after washing. Please DO NOT use if you have an allergy to CHG or antibacterial soaps.  If your skin becomes reddened/irritated stop using the CHG and inform your nurse when you arrive at Short Stay. Do not shave (including legs and underarms) for at least 48 hours prior to the first CHG shower.  You may shave your face/neck. Please follow these instructions carefully:  1.  Shower with CHG Soap the night before surgery and the  morning of Surgery.  2.  If you choose to wash your hair, wash your hair first as usual with your  normal  shampoo.  3.  After you shampoo, rinse your hair and body thoroughly to remove the  shampoo.                           4.  Use CHG as you would any other liquid soap.  You can apply chg directly  to the skin and wash                       Gently with a scrungie or clean washcloth.  5.  Apply the CHG Soap to your body ONLY FROM THE NECK DOWN.   Do not use on face/ open                           Wound or open sores. Avoid contact with eyes, ears mouth and genitals (private parts).                       Wash face,  Genitals (private parts) with your normal soap.             6.  Wash thoroughly, paying special attention to the area where your surgery  will be performed.  7.  Thoroughly rinse your body with warm water from the neck down.  8.  DO NOT shower/wash with your normal soap after using and rinsing off  the CHG Soap.                9.  Pat yourself dry with a clean towel.            10.  Wear clean pajamas.            11.  Place clean sheets on your bed the night of your first shower and do not  sleep with pets. Day of Surgery : Do not apply any lotions/deodorants the morning of surgery.  Please wear clean clothes to the  hospital/surgery center.  FAILURE TO FOLLOW THESE INSTRUCTIONS MAY RESULT IN THE CANCELLATION OF YOUR SURGERY PATIENT SIGNATURE_________________________________  NURSE SIGNATURE__________________________________  ________________________________________________________________________

## 2019-01-27 NOTE — Progress Notes (Signed)
06-11-18 (Epic) CXR  04-11-18 (Epic) EKG  03-23-17 (Epic) ECHO

## 2019-01-28 ENCOUNTER — Encounter (HOSPITAL_COMMUNITY)
Admission: RE | Admit: 2019-01-28 | Discharge: 2019-01-28 | Disposition: A | Discharge status: Home / Self Care | Payer: Medicare Other | Source: Ambulatory Visit | Attending: Urology | Admitting: Urology

## 2019-01-28 ENCOUNTER — Other Ambulatory Visit: Payer: Self-pay

## 2019-01-28 ENCOUNTER — Other Ambulatory Visit (HOSPITAL_COMMUNITY)
Admission: RE | Admit: 2019-01-28 | Discharge: 2019-01-28 | Disposition: A | Discharge status: Home / Self Care | Payer: Medicare Other | Source: Ambulatory Visit | Attending: Urology | Admitting: Urology

## 2019-01-28 ENCOUNTER — Encounter (HOSPITAL_COMMUNITY): Payer: Self-pay

## 2019-01-28 DIAGNOSIS — Z7982 Long term (current) use of aspirin: Secondary | ICD-10-CM | POA: Diagnosis not present

## 2019-01-28 DIAGNOSIS — F419 Anxiety disorder, unspecified: Secondary | ICD-10-CM | POA: Diagnosis not present

## 2019-01-28 DIAGNOSIS — N131 Hydronephrosis with ureteral stricture, not elsewhere classified: Secondary | ICD-10-CM | POA: Insufficient documentation

## 2019-01-28 DIAGNOSIS — F329 Major depressive disorder, single episode, unspecified: Secondary | ICD-10-CM | POA: Diagnosis not present

## 2019-01-28 DIAGNOSIS — Z96642 Presence of left artificial hip joint: Secondary | ICD-10-CM | POA: Diagnosis not present

## 2019-01-28 DIAGNOSIS — Z1159 Encounter for screening for other viral diseases: Secondary | ICD-10-CM | POA: Diagnosis not present

## 2019-01-28 DIAGNOSIS — I10 Essential (primary) hypertension: Secondary | ICD-10-CM | POA: Diagnosis not present

## 2019-01-28 DIAGNOSIS — N400 Enlarged prostate without lower urinary tract symptoms: Secondary | ICD-10-CM | POA: Diagnosis not present

## 2019-01-28 DIAGNOSIS — Z87440 Personal history of urinary (tract) infections: Secondary | ICD-10-CM | POA: Diagnosis not present

## 2019-01-28 DIAGNOSIS — Z01812 Encounter for preprocedural laboratory examination: Secondary | ICD-10-CM | POA: Insufficient documentation

## 2019-01-28 DIAGNOSIS — G629 Polyneuropathy, unspecified: Secondary | ICD-10-CM | POA: Diagnosis not present

## 2019-01-28 DIAGNOSIS — N135 Crossing vessel and stricture of ureter without hydronephrosis: Secondary | ICD-10-CM | POA: Diagnosis present

## 2019-01-28 DIAGNOSIS — Z79899 Other long term (current) drug therapy: Secondary | ICD-10-CM | POA: Diagnosis not present

## 2019-01-28 LAB — CBC
HCT: 38.7 % — ABNORMAL LOW (ref 39.0–52.0)
Hemoglobin: 12 g/dL — ABNORMAL LOW (ref 13.0–17.0)
MCH: 27.5 pg (ref 26.0–34.0)
MCHC: 31 g/dL (ref 30.0–36.0)
MCV: 88.8 fL (ref 80.0–100.0)
Platelets: 269 10*3/uL (ref 150–400)
RBC: 4.36 MIL/uL (ref 4.22–5.81)
RDW: 14.8 % (ref 11.5–15.5)
WBC: 9 10*3/uL (ref 4.0–10.5)
nRBC: 0 % (ref 0.0–0.2)

## 2019-01-28 LAB — BASIC METABOLIC PANEL
Anion gap: 14 (ref 5–15)
BUN: 24 mg/dL — ABNORMAL HIGH (ref 8–23)
CO2: 22 mmol/L (ref 22–32)
Calcium: 9.4 mg/dL (ref 8.9–10.3)
Chloride: 101 mmol/L (ref 98–111)
Creatinine, Ser: 1.68 mg/dL — ABNORMAL HIGH (ref 0.61–1.24)
GFR calc Af Amer: 42 mL/min — ABNORMAL LOW (ref >60)
GFR calc non Af Amer: 36 mL/min — ABNORMAL LOW (ref >60)
Glucose, Bld: 99 mg/dL (ref 70–99)
Potassium: 5.2 mmol/L — ABNORMAL HIGH (ref 3.5–5.1)
Sodium: 137 mmol/L (ref 135–145)

## 2019-01-29 LAB — NOVEL CORONAVIRUS, NAA (HOSP ORDER, SEND-OUT TO REF LAB; TAT 18-24 HRS): SARS-CoV-2, NAA: NOT DETECTED

## 2019-01-30 MED ORDER — GENTAMICIN SULFATE 40 MG/ML IJ SOLN
5.0000 mg/kg | INTRAVENOUS | Status: AC
  Administered 2019-01-31: 10:00:00 350 mg via INTRAVENOUS
  Filled 2019-01-30: qty 8.75

## 2019-01-31 ENCOUNTER — Ambulatory Visit (HOSPITAL_COMMUNITY): Payer: Medicare Other | Admitting: Physician Assistant

## 2019-01-31 ENCOUNTER — Encounter (HOSPITAL_COMMUNITY): Payer: Self-pay

## 2019-01-31 ENCOUNTER — Ambulatory Visit (HOSPITAL_COMMUNITY): Payer: Medicare Other

## 2019-01-31 ENCOUNTER — Ambulatory Visit (HOSPITAL_COMMUNITY)
Admission: RE | Admit: 2019-01-31 | Discharge: 2019-01-31 | Disposition: A | Discharge status: Home / Self Care | Payer: Medicare Other | Source: Other Acute Inpatient Hospital | Attending: Urology | Admitting: Urology

## 2019-01-31 ENCOUNTER — Ambulatory Visit (HOSPITAL_COMMUNITY): Payer: Medicare Other | Admitting: Anesthesiology

## 2019-01-31 ENCOUNTER — Encounter (HOSPITAL_COMMUNITY)
Admission: RE | Disposition: A | Discharge status: Home / Self Care | Payer: Self-pay | Source: Other Acute Inpatient Hospital | Attending: Urology

## 2019-01-31 DIAGNOSIS — I10 Essential (primary) hypertension: Secondary | ICD-10-CM | POA: Insufficient documentation

## 2019-01-31 DIAGNOSIS — I129 Hypertensive chronic kidney disease with stage 1 through stage 4 chronic kidney disease, or unspecified chronic kidney disease: Secondary | ICD-10-CM | POA: Diagnosis not present

## 2019-01-31 DIAGNOSIS — Z96642 Presence of left artificial hip joint: Secondary | ICD-10-CM | POA: Insufficient documentation

## 2019-01-31 DIAGNOSIS — Z87440 Personal history of urinary (tract) infections: Secondary | ICD-10-CM | POA: Insufficient documentation

## 2019-01-31 DIAGNOSIS — K279 Peptic ulcer, site unspecified, unspecified as acute or chronic, without hemorrhage or perforation: Secondary | ICD-10-CM | POA: Diagnosis not present

## 2019-01-31 DIAGNOSIS — N135 Crossing vessel and stricture of ureter without hydronephrosis: Secondary | ICD-10-CM | POA: Diagnosis not present

## 2019-01-31 DIAGNOSIS — F419 Anxiety disorder, unspecified: Secondary | ICD-10-CM | POA: Insufficient documentation

## 2019-01-31 DIAGNOSIS — Z1159 Encounter for screening for other viral diseases: Secondary | ICD-10-CM | POA: Diagnosis not present

## 2019-01-31 DIAGNOSIS — Z01812 Encounter for preprocedural laboratory examination: Secondary | ICD-10-CM | POA: Diagnosis not present

## 2019-01-31 DIAGNOSIS — N400 Enlarged prostate without lower urinary tract symptoms: Secondary | ICD-10-CM | POA: Insufficient documentation

## 2019-01-31 DIAGNOSIS — Z79899 Other long term (current) drug therapy: Secondary | ICD-10-CM | POA: Insufficient documentation

## 2019-01-31 DIAGNOSIS — G629 Polyneuropathy, unspecified: Secondary | ICD-10-CM | POA: Diagnosis not present

## 2019-01-31 DIAGNOSIS — Z7982 Long term (current) use of aspirin: Secondary | ICD-10-CM | POA: Insufficient documentation

## 2019-01-31 DIAGNOSIS — F329 Major depressive disorder, single episode, unspecified: Secondary | ICD-10-CM | POA: Insufficient documentation

## 2019-01-31 DIAGNOSIS — N131 Hydronephrosis with ureteral stricture, not elsewhere classified: Secondary | ICD-10-CM | POA: Diagnosis not present

## 2019-01-31 DIAGNOSIS — N183 Chronic kidney disease, stage 3 (moderate): Secondary | ICD-10-CM | POA: Diagnosis not present

## 2019-01-31 HISTORY — PX: CYSTOSCOPY W/ URETERAL STENT PLACEMENT: SHX1429

## 2019-01-31 SURGERY — CYSTOSCOPY, WITH RETROGRADE PYELOGRAM AND URETERAL STENT INSERTION
Anesthesia: General | Laterality: Left

## 2019-01-31 MED ORDER — PROPOFOL 10 MG/ML IV BOLUS
INTRAVENOUS | Status: AC
  Filled 2019-01-31: qty 20

## 2019-01-31 MED ORDER — FENTANYL CITRATE (PF) 250 MCG/5ML IJ SOLN
INTRAMUSCULAR | Status: AC
  Filled 2019-01-31: qty 5

## 2019-01-31 MED ORDER — OXYCODONE HCL 5 MG PO TABS: 5.0000 mg | ORAL_TABLET | Freq: Once | ORAL | Status: DC | PRN

## 2019-01-31 MED ORDER — LACTATED RINGERS IV SOLN
INTRAVENOUS | Status: DC
  Administered 2019-01-31: 10:00:00 via INTRAVENOUS

## 2019-01-31 MED ORDER — ONDANSETRON HCL 4 MG/2ML IJ SOLN
INTRAMUSCULAR | Status: DC | PRN
  Administered 2019-01-31: 4 mg via INTRAVENOUS

## 2019-01-31 MED ORDER — PROMETHAZINE HCL 25 MG/ML IJ SOLN: 6.2500 mg | INTRAMUSCULAR | Status: DC | PRN

## 2019-01-31 MED ORDER — OXYCODONE HCL 5 MG/5ML PO SOLN: 5.0000 mg | Freq: Once | ORAL | Status: DC | PRN

## 2019-01-31 MED ORDER — IOHEXOL 300 MG/ML  SOLN
INTRAMUSCULAR | Status: DC | PRN
  Administered 2019-01-31: 17 mL

## 2019-01-31 MED ORDER — HYDROMORPHONE HCL 1 MG/ML IJ SOLN: 0.2500 mg | INTRAMUSCULAR | Status: DC | PRN

## 2019-01-31 MED ORDER — FENTANYL CITRATE (PF) 100 MCG/2ML IJ SOLN
INTRAMUSCULAR | Status: DC | PRN
  Administered 2019-01-31: 25 ug via INTRAVENOUS

## 2019-01-31 MED ORDER — EPHEDRINE SULFATE 50 MG/ML IJ SOLN
INTRAMUSCULAR | Status: DC | PRN
  Administered 2019-01-31: 5 mg via INTRAVENOUS

## 2019-01-31 MED ORDER — ONDANSETRON HCL 4 MG/2ML IJ SOLN
INTRAMUSCULAR | Status: AC
  Filled 2019-01-31: qty 2

## 2019-01-31 MED ORDER — LIDOCAINE HCL (CARDIAC) PF 100 MG/5ML IV SOSY
PREFILLED_SYRINGE | INTRAVENOUS | Status: DC | PRN
  Administered 2019-01-31: 60 mg via INTRAVENOUS

## 2019-01-31 MED ORDER — FENTANYL CITRATE (PF) 100 MCG/2ML IJ SOLN
INTRAMUSCULAR | Status: AC
  Filled 2019-01-31: qty 2

## 2019-01-31 MED ORDER — PROPOFOL 10 MG/ML IV BOLUS
INTRAVENOUS | Status: DC | PRN
  Administered 2019-01-31: 100 mg via INTRAVENOUS
  Administered 2019-01-31 (×2): 50 mg via INTRAVENOUS

## 2019-01-31 MED ORDER — SODIUM CHLORIDE 0.9 % IR SOLN
Status: DC | PRN
  Administered 2019-01-31: 3000 mL

## 2019-01-31 SURGICAL SUPPLY — 16 items
BAG URO CATCHER STRL LF (MISCELLANEOUS) ×2 IMPLANT
BASKET ZERO TIP NITINOL 2.4FR (BASKET) IMPLANT
BSKT STON RTRVL ZERO TP 2.4FR (BASKET)
CATH INTERMIT  6FR 70CM (CATHETERS) ×1 IMPLANT
CLOTH BEACON ORANGE TIMEOUT ST (SAFETY) IMPLANT
COVER WAND RF STERILE (DRAPES) IMPLANT
GLOVE BIOGEL M STRL SZ7.5 (GLOVE) ×2 IMPLANT
GOWN STRL REUS W/TWL LRG LVL3 (GOWN DISPOSABLE) ×4 IMPLANT
GUIDEWIRE ANG ZIPWIRE 038X150 (WIRE) ×2 IMPLANT
GUIDEWIRE STR DUAL SENSOR (WIRE) IMPLANT
KIT TURNOVER KIT A (KITS) IMPLANT
MANIFOLD NEPTUNE II (INSTRUMENTS) ×2 IMPLANT
PACK CYSTO (CUSTOM PROCEDURE TRAY) ×2 IMPLANT
STENT CONTOUR 7FRX26X.038 (STENTS) ×2 IMPLANT
TUBING CONNECTING 10 (TUBING) ×2 IMPLANT
TUBING UROLOGY SET (TUBING) IMPLANT

## 2019-01-31 NOTE — H&P (Signed)
Urology Admission H&P  Chief Complaint: Pre-Op LEFT Ureteral Stent Change  History of Present Illness:   1 - Obstructing Left Ureteral Stricture - worrisome soft tissue thickening of distal left ureter by CT and operative retrograde pyelogram 05/06/14. Now s/p stenting. No bladder lesion or additional lesions by imaging. He has h/o extensive smokeless tobacco exposure. He is NOT candidate for definitive repair.    Recent Course:   05/2014 - Left Ureteroscopy / biopsy / brushing / washing / stent change (6x26)- path just mild atypia only, no frank carcinoma,  10/2014 - Repeat Left Ureteroscopy / Biopsy / stent exchange - path benign, no progression of stricture; 03/2015 Left stent change (7x26), no intraluminal filling defects  09/2015 - Stent change 7x26;  10/2016 - Stent change 7/26;  09/2017 - Stent change 7x26 contour; 04/2018 - Stent exchange 7x26 contour, Cr 1.6    2 - Incontinence - Long h/o near total incontinence s/p TURP previously by Dr. Rosana Hoes. Manages with adult diapers x years.    PMH sig for Billroth II procedure R+Y, bilateral inguinal hernia repair with mesh, alcoholism (now very involved and compliant with AA) His PCP is Dr. Virgina Jock. 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    Today [name] is seen to proceed with LEFT ureteral stent exchange. No interval fevers.     Past Medical History:  Diagnosis Date  . Anxiety   . AR (aortic regurgitation) 03/23/2017   Mild, Noted on ECHO  . Arthritis    DJD  . Benign enlargement of prostate    frequent UTI, trim of prostate done , with some issues of incontinence now.  . Chronic bronchitis ([place])    chronic- usually has phelgm often  . Depression   . Dyspnea    with excertion  . Fluid retention in legs    occasional use of diuretic as needed  . GI bleed   . Grade I diastolic dysfunction   . Hard of hearing    hear more on left side- no hearing aid  . History of colon polyps   . History of transfusion    . Hydronephrosis 2018   Left  . Hypertension   . Moderate concentric left ventricular hypertrophy 03/23/2017   Noted on ECHO  . Perforated, severe stomach ulcer ([place])   . Peripheral neuropathy   . Spinal stenosis   . TR (tricuspid regurgitation) 03/23/2017   Mild, Noted on ECHO  . Transfusion history    none recent  . Ureteral stricture    CHRONIC  . Urinary incontinence   . UTI (urinary tract infection)    frequent  . Wears glasses    Past Surgical History:  Procedure Laterality Date  . ABDOMINAL SURGERY     bilroths tube '74  . COLONOSCOPY N/A 09/29/2014   Procedure: COLONOSCOPY;  Surgeon: Beryle Beams, MD;  [facility];  Service: Endoscopy;  Laterality: N/A;  . CYSTOSCOPY W/ URETERAL STENT PLACEMENT Left 05/06/2014   Procedure: CYSTOSCOPY WITH RETROGRADE PYELOGRAM/URETERAL STENT PLACEMENT;  Surgeon: [name], MD;  [facility];  Service: Urology;  Laterality: Left;  . CYSTOSCOPY W/ URETERAL STENT PLACEMENT Left 03/31/2015   Procedure: CYSTOSCOPY WITH LEFT RETROGRADE PYELOGRAM/URETERAL STENT EXCHANGE;  Surgeon: [name], MD;  [facility];  Service: Urology;  Laterality: Left;  . CYSTOSCOPY W/ URETERAL STENT PLACEMENT Left 09/15/2015   Procedure: CYSTOSCOPY WITH LEFT RETROGRADE PYELOGRAM/URETERAL STENT EXCHANGE ;  Surgeon: [name], MD;  [facility];  Service: Urology;  Laterality: Left;  . CYSTOSCOPY W/ URETERAL STENT PLACEMENT Left 04/28/2016   Procedure: CYSTOSCOPY WITH RETROGRADE PYELOGRAM/URETERAL STENT EXCHANGE;  Surgeon: [name], MD;  [facility];  Service: Urology;  Laterality: Left;  . CYSTOSCOPY W/ URETERAL STENT PLACEMENT Left 11/08/2016   Procedure: CYSTOSCOPY WITH RETROGRADE PYELOGRAM/URETERAL STENT EXCHANGE;  Surgeon: [name], MD;  [facility];  Service: Urology;  Laterality: Left;  . CYSTOSCOPY W/ URETERAL STENT PLACEMENT Left 09/14/2017   Procedure: CYSTOSCOPY WITH RETROGRADE PYELOGRAM/URETERAL STENT  PLACEMENT;  Surgeon: [name], MD;  [facility];  Service: Urology;  Laterality: Left;  . CYSTOSCOPY W/ URETERAL STENT PLACEMENT Left 04/10/2018   Procedure: CYSTOSCOPY WITH LEFT RETROGRADE URETERAL STENT EXCHANGE;  Surgeon: [name], MD;  [facility];  Service: Urology;  Laterality: Left;  . CYSTOSCOPY WITH RETROGRADE PYELOGRAM, URETEROSCOPY AND STENT PLACEMENT Left 10/21/2014   Procedure: CYSTOSCOPY WITH RETROGRADE PYELOGRAM, URETEROSCOPY,BIOPSY AND STENT CHANGE;  Surgeon: [name], MD;  [facility];  Service: Urology;  Laterality: Left;  . CYSTOSCOPY/RETROGRADE/URETEROSCOPY Left 06/03/2014   Procedure: CYSTOSCOPY/RETROGRADE/URETEROSCOPY WITH BIOPSY,STENT EXCHANGE;  Surgeon: [name], MD;  [facility];  Service: Urology;  Laterality: Left;  . ESOPHAGOGASTRODUODENOSCOPY N/A 09/28/2014   Procedure: ESOPHAGOGASTRODUODENOSCOPY (EGD);  Surgeon: Beryle Beams, MD;  Location: [facility];  Service: Endoscopy;  Laterality: N/A;  . GASTRIC ROUX-EN-Y     '74  . HERNIA REPAIR     bilateral hernia repairs   . TONSILLECTOMY    . TOTAL HIP ARTHROPLASTY Left 05/19/2016   Procedure: LEFT TOTAL HIP ARTHROPLASTY ANTERIOR APPROACH;  Surgeon: Mcarthur Rossetti, MD;  [facility];  Service: Orthopedics;  Laterality: Left;  . TRANSURETHRAL RESECTION OF PROSTATE    . VAGOTOMY      Home Medications:  Current Facility-Administered Medications  Medication Dose Route Frequency Provider Last Rate Last Dose  . gentamicin (GARAMYCIN) 350 mg in dextrose 5 % 100 mL IVPB  5 mg/kg (Adjusted) Intravenous 30 min Pre-Op [name], MD       Current Outpatient Medications  Medication Sig Dispense Refill Last Dose  . albuterol (VENTOLIN HFA) 108 (90 Base) MCG/ACT inhaler Inhale 2 puffs into the lungs every 6 (six) hours as needed for wheezing or shortness of breath.     Marland Kitchen amLODipine (NORVASC) 10 MG tablet Take 1 tablet (10 mg total) by mouth daily.  30 tablet 11   . Ascorbic Acid (VITAMIN C) 1000 MG tablet Take 2,000 mg by mouth daily.      Marland Kitchen aspirin EC 81 MG tablet Take 81 mg by mouth daily.     . Cholecalciferol (VITAMIN D) 50 MCG (2000 UT) CAPS Take 4,000 Units by mouth daily.     . cyanocobalamin (,VITAMIN B-12,) 1000 MCG/ML injection Inject 1,000 mcg into the muscle every 30 (thirty) days.     . fish oil-omega-3 fatty acids 1000 MG capsule Take 1 g by mouth 2 (two) times a week.      Marland Kitchen guaiFENesin (MUCINEX) 600 MG 12 hr tablet Take 1,200 mg by mouth 2 (two) times daily.      Marland Kitchen lisinopril (ZESTRIL) 5 MG tablet Take 5 mg by mouth daily.     . meclizine (ANTIVERT) 25 MG tablet Take 25 mg by mouth 2 (two) times daily as needed for dizziness.     . Multiple Vitamin (MULTIVITAMIN WITH MINERALS) TABS tablet Take 1 tablet by mouth every morning.     Marland Kitchen PARoxetine (PAXIL) 40 MG tablet Take  1 tablet (40 mg total) by mouth daily. (Patient taking differently: Take 40 mg by mouth at bedtime. ) 30 tablet 11   . psyllium (HYDROCIL/METAMUCIL) 95 % PACK Take 1 packet by mouth daily.      Marland Kitchen senna-docusate (SENOKOT-S) 8.6-50 MG tablet Take 1 tablet by mouth at bedtime. (Patient taking differently: Take 2 tablets by mouth every evening. ) 30 tablet 0   . testosterone cypionate (DEPOTESTOTERONE CYPIONATE) 200 MG/ML injection Inject 40 mg into the muscle every 28 (twenty-eight) days. 2/10 of cc.     . vitamin E 100 UNIT capsule Take 100 Units by mouth every morning.      . bisacodyl (DULCOLAX) 10 MG suppository Place 1 suppository (10 mg total) rectally daily as needed for moderate constipation. (Patient not taking: Reported on 01/24/2019) 12 suppository 0 Not Taking at Unknown time  . ciprofloxacin (CIPRO) 250 MG tablet Take 1 tablet (250 mg total) by mouth 2 (two) times daily. (Patient not taking: Reported on 01/24/2019) 20 tablet 0 Not Taking at Unknown time  . polyethylene glycol (MIRALAX / GLYCOLAX) packet Take 17 g by mouth daily as needed. (Patient not  taking: Reported on 01/24/2019) 14 each 0 Not Taking at Unknown time   Allergies: No Known Allergies  No family history on file. Social History:  reports that he has never smoked. He has quit using smokeless tobacco.  His smokeless tobacco use included chew. He reports previous alcohol use. He reports that he does not use drugs.  ROS  Physical Exam:  Vital signs in last 24 hours:   Physical Exam  Laboratory Data:  No results found for this or any previous visit (from the past 24 hour(s)). Recent Results (from the past 240 hour(s))  Novel Coronavirus, NAA (hospital order; send-out to ref lab)     Status: None   Collection Time: 01/28/19 11:00 AM   Specimen: Nasopharyngeal Swab; Respiratory  Result Value Ref Range Status   SARS-CoV-2, NAA NOT DETECTED NOT DETECTED Final    Comment: (NOTE) This test was developed and its performance characteristics determined by [employer]. This test has not been FDA cleared or approved. This test has been authorized by FDA under an Emergency Use Authorization (EUA). This test is only authorized for the duration of time the declaration that circumstances exist justifying the authorization of the emergency use of in vitro diagnostic tests for detection of SARS-CoV-2 virus and/or diagnosis of COVID-19 infection under section 564(b)(1) of the Act, 21 U.S.C. 725DGU-4(Q)(0), unless the authorization is terminated or revoked sooner. When diagnostic testing is negative, the possibility of a false negative result should be considered in the context of a patient's recent exposures and the presence of clinical signs and symptoms consistent with COVID-19. An individual without symptoms of COVID-19 and who is not shedding SARS-CoV-2 virus would expect to have a negative (not detected) result in this assay. Performed  At: [facility] [address] [place], [place] [id number] Rush Farmer MD LO:[id number]    [place]  Final    Comment: Performed at [facility], [place] [address] [place], [place] [address]   Creatinine: Recent Labs    01/28/19 1030  CREATININE 1.68*    Plan:   Proceed as planned with LEFT ureteral stent exchange. Risks, benefits, expected per-op course discussed previously and reiterated today.   [name] 01/31/2019, 7:01 AM

## 2019-01-31 NOTE — Anesthesia Procedure Notes (Signed)
Procedure Name: LMA Insertion Date/Time: 01/31/2019 10:18 AM Performed by: Glory Buff, CRNA Pre-anesthesia Checklist: Patient identified, Emergency Drugs available, Suction available and Patient being monitored Patient Re-evaluated:Patient Re-evaluated prior to induction Oxygen Delivery Method: Circle system utilized Preoxygenation: Pre-oxygenation with 100% oxygen Induction Type: IV induction LMA: LMA inserted LMA Size: 4.0 Number of attempts: 2 (Attempted #5 LMA but unable to obtain seal, changed to #4 LMA with good seal.) Placement Confirmation: positive ETCO2 Tube secured with: Tape Dental Injury: Teeth and Oropharynx as per pre-operative assessment

## 2019-01-31 NOTE — Brief Op Note (Signed)
01/31/2019  10:37 AM  PATIENT:  Steven Cain  85 y.o. male  PRE-OPERATIVE DIAGNOSIS:  LEFT URETERAL STRICTURE  POST-OPERATIVE DIAGNOSIS:  LEFT URETERAL STRICTURE  PROCEDURE:  Procedure(s) with comments: CYSTOSCOPY WITH RETROGRADE PYELOGRAM/URETERAL STENT PLACEMENT (Left) - 45 MINS  SURGEON:  Surgeon(s) and Role:    * [name], MD - Primary  PHYSICIAN ASSISTANT:   ASSISTANTS: none   ANESTHESIA:   general  EBL:  minimal   BLOOD ADMINISTERED:none  DRAINS: none   LOCAL MEDICATIONS USED:  NONE  SPECIMEN:  Source of Specimen:  left ureteral stent  DISPOSITION OF SPECIMEN:  discard  COUNTS:  YES  TOURNIQUET:  * No tourniquets in log *  DICTATION: .Other Dictation: Dictation Number [id number]  PLAN OF CARE: Discharge to home after PACU  PATIENT DISPOSITION:  PACU - hemodynamically stable.   Delay start of Pharmacological VTE agent (>24hrs) due to surgical blood loss or risk of bleeding: yes

## 2019-01-31 NOTE — Transfer of Care (Signed)
Immediate Anesthesia Transfer of Care Note  Patient: Steven Cain  Procedure(s) Performed: CYSTOSCOPY WITH RETROGRADE PYELOGRAM/URETERAL STENT PLACEMENT (Left )  Patient Location: PACU  Anesthesia Type:General  Level of Consciousness: drowsy, patient cooperative and responds to stimulation  Airway & Oxygen Therapy: Patient Spontanous Breathing and Patient connected to face mask oxygen  Post-op Assessment: Report given to RN and Post -op Vital signs reviewed and stable  Post vital signs: Reviewed and stable  Last Vitals:  Vitals Value Taken Time  BP 135/68 01/31/19 1048  Temp    Pulse 67 01/31/19 1049  Resp 7 01/31/19 1049  SpO2 100 % 01/31/19 1049  Vitals shown include unvalidated device data.  Last Pain:  Vitals:   01/31/19 0934  TempSrc:   PainSc: 0-No pain         Complications: No apparent anesthesia complications

## 2019-01-31 NOTE — Discharge Instructions (Signed)
1 - You may have urinary urgency (bladder spasms) and bloody urine on / off with stent in place. This is normal.  2 - Call MD or go to ER for fever >102, severe pain / nausea / vomiting not relieved by medications, or acute change in medical status   General Anesthesia, Adult, Care After This sheet gives you information about how to care for yourself after your procedure. Your health care provider may also give you more specific instructions. If you have problems or questions, contact your health care provider. What can I expect after the procedure? After the procedure, the following side effects are common:  Pain or discomfort at the IV site.  Nausea.  Vomiting.  Sore throat.  Trouble concentrating.  Feeling cold or chills.  Weak or tired.  Sleepiness and fatigue.  Soreness and body aches. These side effects can affect parts of the body that were not involved in surgery. Follow these instructions at home:  For at least 24 hours after the procedure:  Have a responsible adult stay with you. It is important to have someone help care for you until you are awake and alert.  Rest as needed.  Do not: ? Participate in activities in which you could fall or become injured. ? Drive. ? Use heavy machinery. ? Drink alcohol. ? Take sleeping pills or medicines that cause drowsiness. ? Make important decisions or sign legal documents. ? Take care of children on your own. Eating and drinking  Follow any instructions from your health care provider about eating or drinking restrictions.  When you feel hungry, start by eating small amounts of foods that are soft and easy to digest (bland), such as toast. Gradually return to your regular diet.  Drink enough fluid to keep your urine pale yellow.  If you vomit, rehydrate by drinking water, juice, or clear broth. General instructions  If you have sleep apnea, surgery and certain medicines can increase your risk for breathing problems.  Follow instructions from your health care provider about wearing your sleep device: ? Anytime you are sleeping, including during daytime naps. ? While taking prescription pain medicines, sleeping medicines, or medicines that make you drowsy.  Return to your normal activities as told by your health care provider. Ask your health care provider what activities are safe for you.  Take over-the-counter and prescription medicines only as told by your health care provider.  If you smoke, do not smoke without supervision.  Keep all follow-up visits as told by your health care provider. This is important. Contact a health care provider if:  You have nausea or vomiting that does not get better with medicine.  You cannot eat or drink without vomiting.  You have pain that does not get better with medicine.  You are unable to pass urine.  You develop a skin rash.  You have a fever.  You have redness around your IV site that gets worse. Get help right away if:  You have difficulty breathing.  You have chest pain.  You have blood in your urine or stool, or you vomit blood. Summary  After the procedure, it is common to have a sore throat or nausea. It is also common to feel tired.  Have a responsible adult stay with you for the first 24 hours after general anesthesia. It is important to have someone help care for you until you are awake and alert.  When you feel hungry, start by eating small amounts of foods that are soft  and easy to digest (bland), such as toast. Gradually return to your regular diet.  Drink enough fluid to keep your urine pale yellow.  Return to your normal activities as told by your health care provider. Ask your health care provider what activities are safe for you. This information is not intended to replace advice given to you by your health care provider. Make sure you discuss any questions you have with your health care provider. Document Released: 11/06/2000  Document Revised: 03/16/2017 Document Reviewed: 03/16/2017 Elsevier Interactive Patient Education  2019 [employer].

## 2019-01-31 NOTE — Anesthesia Postprocedure Evaluation (Signed)
Anesthesia Post Note  Patient: Olegario Shearer  Procedure(s) Performed: CYSTOSCOPY WITH RETROGRADE PYELOGRAM/URETERAL STENT PLACEMENT (Left )     Patient location during evaluation: PACU Anesthesia Type: General Level of consciousness: awake and alert Pain management: pain level controlled Vital Signs Assessment: post-procedure vital signs reviewed and stable Respiratory status: spontaneous breathing, nonlabored ventilation and respiratory function stable Cardiovascular status: blood pressure returned to baseline and stable Postop Assessment: no apparent nausea or vomiting Anesthetic complications: no    Last Vitals:  Vitals:   01/31/19 1115 01/31/19 1130  BP: (!) 141/58 (!) 160/73  Pulse: (!) 58 61  Resp: 15 16  Temp: 36.7 C 36.6 C  SpO2: 93% 94%    Last Pain:  Vitals:   01/31/19 1130  TempSrc:   PainSc: 0-No pain                 Lynda Rainwater

## 2019-01-31 NOTE — Anesthesia Preprocedure Evaluation (Addendum)
Anesthesia Evaluation  Patient identified by MRN, date of birth, ID band Patient awake    Reviewed: Allergy & Precautions, NPO status , Patient's Chart, lab work & pertinent test results  Airway Mallampati: II  TM Distance: >3 FB Neck ROM: Full    Dental  (+) Dental Advisory Given   Pulmonary neg pulmonary ROS,    Pulmonary exam normal breath sounds clear to auscultation       Cardiovascular hypertension, Pt. on medications Normal cardiovascular exam Rhythm:Regular Rate:Normal     Neuro/Psych Anxiety Depression negative neurological ROS  negative psych ROS   GI/Hepatic Neg liver ROS, PUD,   Endo/Other  negative endocrine ROS  Renal/GU negative Renal ROS  negative genitourinary   Musculoskeletal  (+) Arthritis , Osteoarthritis,    Abdominal (+) + obese,   Peds negative pediatric ROS (+)  Hematology negative hematology ROS (+) anemia ,   Anesthesia Other Findings   Reproductive/Obstetrics negative OB ROS                             Anesthesia Physical  Anesthesia Plan  ASA: III  Anesthesia Plan: General   Post-op Pain Management:    Induction: Intravenous  PONV Risk Score and Plan: 2 and Ondansetron, Treatment may vary due to age or medical condition and Midazolam  Airway Management Planned: LMA  Additional Equipment:   Intra-op Plan:   Post-operative Plan: Extubation in OR  Informed Consent: I have reviewed the patients History and Physical, chart, labs and discussed the procedure including the risks, benefits and alternatives for the proposed anesthesia with the patient or authorized representative who has indicated his/her understanding and acceptance.     Dental advisory given  Plan Discussed with: CRNA  Anesthesia Plan Comments:         Anesthesia Quick Evaluation

## 2019-01-31 NOTE — Op Note (Signed)
NAME: [name], [name] MEDICAL RECORD [id number] ACCOUNT [id number] DATE OF BIRTH:1931/03/18 FACILITY: [facility] LOCATION: [facility] PHYSICIAN:Avraham Benish, MD  OPERATIVE REPORT  DATE OF PROCEDURE:  01/31/2019  PREOPERATIVE DIAGNOSIS:  Chronic left ureteral stricture.  PROCEDURE: 1.  Cystoscopy, left retrograde pyelogram, interpretation. 2.  Exchange left ureteral stent 7 x 26 Contour, no tether.  ESTIMATED BLOOD LOSS:  Nil.  COMPLICATIONS:  None.  SPECIMENS:  Left ureteral stent for discard inspected and intact.  FINDINGS:   1.  Mild left hydronephrosis.  Long segment mid ureteral stricture. 2.  Successful replacement of left ureteral stent proximal end curled in the renal pelvis, distal end in urinary bladder.  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  He has had multiple intraabdominal surgeries and is quite comorbid.  He is not a candidate for any sort of  reconstruction.  He has therefore been managed with chronic stenting, changed approximately every 6 months.  He has done quite well with this for years.  This has maintained his renal function.  He had his last stent change in 04/2018 and he is overdue  for exchange now.  Due to a mild pandemic, his exchange was delayed somewhat until more robust testing had been established, he presents now for stent exchange.  Informed consent was obtained and placed in medical record.  DESCRIPTION OF PROCEDURE:  [name],  procedure left ureteral stent change was confirmed.  Procedure timeout was performed.  Antibiotics given.  General LMA anesthesia induced.  The patient was placed into a low lithotomy position.   Sterile field was created, prepped and draped his penis, perineum and proximal thighs using iodine.  Cystourethroscopy was performed with a 21-French rigid cystoscope with offset lens.  The patient is  stable.  Distal shaft hypospadias otherwise  unremarkable anterior and posterior urethra.  Inspection of the urinary bladder revealed mild trabeculation.  The distal end of the stent was seen in situ.  Fortunately, it was minimally encrusted.  It was grasped and brought out in its entirety set  aside for discard inspected and intact.  The left ureteral orifice was then cannulated with 5 [place] renal catheter and left retrograde pyelogram was obtained.  Left retrograde pyelogram demonstrated a single left ureter and single system left kidney.  There was a long segment mid ureteral stricture without significant change from prior.  There was mild hydronephrosis above this.  A 0.038 sensor wire was  advanced to lower pole of which a new 7 x 26 Contour type stent was placed using cystoscopic and fluoroscopic guidance.  Good proximal and distal plane were noted.  The bladder was entered per cystoscope.  Procedure terminated.   The patient tolerated the procedure well.  No immediate periprocedural complications.  The patient taken to [facility] in stable condition with plan for discharge home.  AN/NUANCE  D:01/31/2019 T:01/31/2019 JOB:[id number]

## 2019-02-03 ENCOUNTER — Encounter (HOSPITAL_COMMUNITY): Payer: Self-pay | Admitting: Urology

## 2019-05-29 DIAGNOSIS — F325 Major depressive disorder, single episode, in full remission: Secondary | ICD-10-CM | POA: Diagnosis not present

## 2019-05-29 DIAGNOSIS — I129 Hypertensive chronic kidney disease with stage 1 through stage 4 chronic kidney disease, or unspecified chronic kidney disease: Secondary | ICD-10-CM | POA: Diagnosis not present

## 2019-05-29 DIAGNOSIS — R627 Adult failure to thrive: Secondary | ICD-10-CM | POA: Diagnosis not present

## 2019-05-29 DIAGNOSIS — E291 Testicular hypofunction: Secondary | ICD-10-CM | POA: Diagnosis not present

## 2019-05-29 DIAGNOSIS — N1330 Unspecified hydronephrosis: Secondary | ICD-10-CM | POA: Diagnosis not present

## 2019-05-29 DIAGNOSIS — N401 Enlarged prostate with lower urinary tract symptoms: Secondary | ICD-10-CM | POA: Diagnosis not present

## 2019-05-29 DIAGNOSIS — N1832 Chronic kidney disease, stage 3b: Secondary | ICD-10-CM | POA: Diagnosis not present

## 2019-05-29 DIAGNOSIS — R2689 Other abnormalities of gait and mobility: Secondary | ICD-10-CM | POA: Diagnosis not present

## 2019-05-29 DIAGNOSIS — R972 Elevated prostate specific antigen [PSA]: Secondary | ICD-10-CM | POA: Diagnosis not present

## 2019-05-29 DIAGNOSIS — N289 Disorder of kidney and ureter, unspecified: Secondary | ICD-10-CM | POA: Diagnosis not present

## 2019-05-31 DIAGNOSIS — Z23 Encounter for immunization: Secondary | ICD-10-CM | POA: Diagnosis not present

## 2019-08-15 DIAGNOSIS — U071 COVID-19: Secondary | ICD-10-CM

## 2019-08-15 HISTORY — DX: COVID-19: U07.1

## 2019-09-04 DIAGNOSIS — Z20828 Contact with and (suspected) exposure to other viral communicable diseases: Secondary | ICD-10-CM | POA: Diagnosis not present

## 2019-09-05 ENCOUNTER — Other Ambulatory Visit: Payer: Self-pay | Admitting: Infectious Diseases

## 2019-09-05 ENCOUNTER — Telehealth: Payer: Self-pay | Admitting: Infectious Diseases

## 2019-09-05 DIAGNOSIS — I10 Essential (primary) hypertension: Secondary | ICD-10-CM

## 2019-09-05 DIAGNOSIS — U071 COVID-19: Secondary | ICD-10-CM

## 2019-09-05 NOTE — Telephone Encounter (Signed)
Called to discuss with the patient's wife about Covid symptoms and the use of bamlanivimab, a monoclonal antibody infusion for those with mild to moderate Covid symptoms and at a high risk of hospitalization.  Pt is qualified for this infusion at the [facility] infusion center due to Age > 65   He received the first dose of COVID vaccine Tuesday 1/19. Next day he had fatigue, chills, watery diarrhea, congestion. The watery diarrhea has resolved but all other symptoms persist.   He has a test pending as of 1/21 but his wife is COVID+ and symptomatic.   Will schedule on 1/26 with his wife pending the results of this test to ensure this is related to COVID infection and not vaccine side effect - although diarrhea not something I am familiar with as a side effect.

## 2019-09-05 NOTE — Progress Notes (Signed)
  I connected by phone with Steven Cain on 09/05/2019 at 3:06 PM to discuss the potential use of an new treatment for mild to moderate COVID-19 viral infection in non-hospitalized patients.  This patient is a 86 y.o. male that meets the FDA criteria for Emergency Use Authorization of bamlanivimab or casirivimab\imdevimab.  Has a (+) direct SARS-CoV-2 viral test result  Has mild or moderate COVID-19   Is ? 84 years of age and weighs ? 40 kg  Is NOT hospitalized due to COVID-19  Is NOT requiring oxygen therapy or requiring an increase in baseline oxygen flow rate due to COVID-19  Is within 10 days of symptom onset  Has at least one of the high risk factor(s) for progression to severe COVID-19 and/or hospitalization as defined in EUA.  Specific high risk criteria : >/= 84 yo   I have spoken and communicated the following to the patient or parent/caregiver:  1. FDA has authorized the emergency use of bamlanivimab and casirivimab\imdevimab for the treatment of mild to moderate COVID-19 in adults and pediatric patients with positive results of direct SARS-CoV-2 viral testing who are 31 years of age and older weighing at least 40 kg, and who are at high risk for progressing to severe COVID-19 and/or hospitalization.  2. The significant known and potential risks and benefits of bamlanivimab and casirivimab\imdevimab, and the extent to which such potential risks and benefits are unknown.  3. Information on available alternative treatments and the risks and benefits of those alternatives, including clinical trials.  4. Patients treated with bamlanivimab and casirivimab\imdevimab should continue to self-isolate and use infection control measures (e.g., wear mask, isolate, social distance, avoid sharing personal items, clean and disinfect "high touch" surfaces, and frequent handwashing) according to CDC guidelines.   5. The patient or parent/caregiver has the option to accept or refuse  bamlanivimab or casirivimab\imdevimab .  After reviewing this information with the patient, The patient agreed to proceed with receiving the bamlanimivab infusion and will be provided a copy of the Fact sheet prior to receiving the infusion.[name] 09/05/2019 3:06 PM

## 2019-09-09 ENCOUNTER — Ambulatory Visit (HOSPITAL_COMMUNITY)
Admission: RE | Admit: 2019-09-09 | Discharge: 2019-09-09 | Disposition: A | Discharge status: Home / Self Care | Payer: Medicare Other | Source: Ambulatory Visit | Attending: Pulmonary Disease | Admitting: Pulmonary Disease

## 2019-09-09 DIAGNOSIS — I10 Essential (primary) hypertension: Secondary | ICD-10-CM | POA: Diagnosis not present

## 2019-09-09 DIAGNOSIS — Z23 Encounter for immunization: Secondary | ICD-10-CM | POA: Insufficient documentation

## 2019-09-09 DIAGNOSIS — U071 COVID-19: Secondary | ICD-10-CM | POA: Insufficient documentation

## 2019-09-09 MED ORDER — METHYLPREDNISOLONE SODIUM SUCC 125 MG IJ SOLR: 125.0000 mg | Freq: Once | INTRAMUSCULAR | Status: DC | PRN

## 2019-09-09 MED ORDER — EPINEPHRINE 0.3 MG/0.3ML IJ SOAJ: 0.3000 mg | Freq: Once | INTRAMUSCULAR | Status: DC | PRN

## 2019-09-09 MED ORDER — FAMOTIDINE IN NACL 20-0.9 MG/50ML-% IV SOLN: 20.0000 mg | Freq: Once | INTRAVENOUS | Status: DC | PRN

## 2019-09-09 MED ORDER — SODIUM CHLORIDE 0.9 % IV SOLN
INTRAVENOUS | Status: DC | PRN
  Administered 2019-09-09: 11:00:00 250 mL via INTRAVENOUS

## 2019-09-09 MED ORDER — DIPHENHYDRAMINE HCL 50 MG/ML IJ SOLN: 50.0000 mg | Freq: Once | INTRAMUSCULAR | Status: DC | PRN

## 2019-09-09 MED ORDER — ALBUTEROL SULFATE HFA 108 (90 BASE) MCG/ACT IN AERS: 2.0000 | INHALATION_SPRAY | Freq: Once | RESPIRATORY_TRACT | Status: DC | PRN

## 2019-09-09 MED ORDER — SODIUM CHLORIDE 0.9 % IV SOLN
700.0000 mg | Freq: Once | INTRAVENOUS | Status: AC
  Administered 2019-09-09: 11:00:00 700 mg via INTRAVENOUS
  Filled 2019-09-09: qty 20

## 2019-09-09 NOTE — Discharge Instructions (Signed)

## 2019-09-09 NOTE — Progress Notes (Signed)
  Diagnosis: COVID-19  Physician: Dr Joya Gaskins   Procedure: Covid Infusion Clinic Med: bamlanivimab infusion - Provided patient with bamlanimivab fact sheet for patients, parents and caregivers prior to infusion.  Complications: No immediate complications noted.  Discharge: Discharged home   Steven Cain 09/09/2019

## 2019-10-06 DIAGNOSIS — N289 Disorder of kidney and ureter, unspecified: Secondary | ICD-10-CM | POA: Diagnosis not present

## 2019-10-06 DIAGNOSIS — Z125 Encounter for screening for malignant neoplasm of prostate: Secondary | ICD-10-CM | POA: Diagnosis not present

## 2019-10-06 DIAGNOSIS — I129 Hypertensive chronic kidney disease with stage 1 through stage 4 chronic kidney disease, or unspecified chronic kidney disease: Secondary | ICD-10-CM | POA: Diagnosis not present

## 2019-10-06 DIAGNOSIS — D692 Other nonthrombocytopenic purpura: Secondary | ICD-10-CM | POA: Diagnosis not present

## 2019-10-06 DIAGNOSIS — N1832 Chronic kidney disease, stage 3b: Secondary | ICD-10-CM | POA: Diagnosis not present

## 2019-10-06 DIAGNOSIS — Z8616 Personal history of COVID-19: Secondary | ICD-10-CM | POA: Diagnosis not present

## 2019-10-06 DIAGNOSIS — N1330 Unspecified hydronephrosis: Secondary | ICD-10-CM | POA: Diagnosis not present

## 2019-10-06 DIAGNOSIS — N401 Enlarged prostate with lower urinary tract symptoms: Secondary | ICD-10-CM | POA: Diagnosis not present

## 2019-10-06 DIAGNOSIS — E538 Deficiency of other specified B group vitamins: Secondary | ICD-10-CM | POA: Diagnosis not present

## 2019-10-06 DIAGNOSIS — Z0000 Encounter for general adult medical examination without abnormal findings: Secondary | ICD-10-CM | POA: Diagnosis not present

## 2019-10-06 DIAGNOSIS — R739 Hyperglycemia, unspecified: Secondary | ICD-10-CM | POA: Diagnosis not present

## 2019-10-06 DIAGNOSIS — R972 Elevated prostate specific antigen [PSA]: Secondary | ICD-10-CM | POA: Diagnosis not present

## 2019-10-06 DIAGNOSIS — I10 Essential (primary) hypertension: Secondary | ICD-10-CM | POA: Diagnosis not present

## 2019-10-06 DIAGNOSIS — E291 Testicular hypofunction: Secondary | ICD-10-CM | POA: Diagnosis not present

## 2019-10-06 DIAGNOSIS — M859 Disorder of bone density and structure, unspecified: Secondary | ICD-10-CM | POA: Diagnosis not present

## 2019-10-06 DIAGNOSIS — F325 Major depressive disorder, single episode, in full remission: Secondary | ICD-10-CM | POA: Diagnosis not present

## 2019-10-21 ENCOUNTER — Other Ambulatory Visit: Payer: Self-pay | Admitting: Urology

## 2019-10-21 DIAGNOSIS — N3941 Urge incontinence: Secondary | ICD-10-CM | POA: Diagnosis not present

## 2019-12-06 ENCOUNTER — Ambulatory Visit: Payer: Medicare Other

## 2019-12-06 ENCOUNTER — Ambulatory Visit: Payer: Medicare Other | Attending: Internal Medicine

## 2019-12-06 DIAGNOSIS — Z23 Encounter for immunization: Secondary | ICD-10-CM

## 2019-12-06 NOTE — Progress Notes (Signed)
   Covid-19 Vaccination Clinic  Name:  Steven Cain    MRN: [id number] DOB: 10/17/1930  12/06/2019  Steven Cain was observed post Covid-19 immunization for 15 minutes without incident. He was provided with Vaccine Information Sheet and instruction to access the V-Safe system.   Steven Cain was instructed to call 911 with any severe reactions post vaccine: Marland Kitchen Difficulty breathing  . Swelling of face and throat  . A fast heartbeat  . A bad rash all over body  . Dizziness and weakness   Immunizations Administered    Name Date Dose VIS Date Route   Pfizer COVID-19 Vaccine 12/06/2019  1:25 PM 0.3 mL 10/08/2018 Intramuscular   Manufacturer: [place]   Lot: [id number]   [place]: [id number]

## 2019-12-30 ENCOUNTER — Other Ambulatory Visit: Payer: Self-pay | Admitting: Urology

## 2020-01-08 ENCOUNTER — Other Ambulatory Visit: Payer: Self-pay

## 2020-01-08 ENCOUNTER — Encounter (HOSPITAL_BASED_OUTPATIENT_CLINIC_OR_DEPARTMENT_OTHER): Payer: Self-pay | Admitting: Urology

## 2020-01-08 NOTE — Progress Notes (Signed)
Spoke w/ via phone for pre-op interview---wife adele pt is hoh Lab needs dos---- I stat 8, ekg             Lab results------echo 03-23-2017 epic COVID test ------01-13-2020@1125  am Arrive at -------915 am 01-16-2020 NPO after ------midnight Medications to take morning of surgery -----albuterol unhaler prn/bring inhaler, amlodipine Diabetic medication -----n/a Patient Special Instructions -----none Pre-Op special Istructions -----none Patient verbalized understanding of instructions that were given at this phone interview. Patient denies shortness of breath, chest pain, fever, cough a this phone interview.

## 2020-01-13 ENCOUNTER — Other Ambulatory Visit (HOSPITAL_COMMUNITY)
Admission: RE | Admit: 2020-01-13 | Discharge: 2020-01-13 | Disposition: A | Discharge status: Home / Self Care | Payer: Medicare Other | Source: Ambulatory Visit | Attending: Urology | Admitting: Urology

## 2020-01-13 DIAGNOSIS — Z20822 Contact with and (suspected) exposure to covid-19: Secondary | ICD-10-CM | POA: Diagnosis not present

## 2020-01-13 DIAGNOSIS — Z01812 Encounter for preprocedural laboratory examination: Secondary | ICD-10-CM | POA: Insufficient documentation

## 2020-01-13 LAB — SARS CORONAVIRUS 2 (TAT 6-24 HRS): SARS Coronavirus 2: NEGATIVE

## 2020-01-16 ENCOUNTER — Encounter (HOSPITAL_BASED_OUTPATIENT_CLINIC_OR_DEPARTMENT_OTHER)
Admission: RE | Disposition: A | Discharge status: Home / Self Care | Payer: Self-pay | Source: Home / Self Care | Attending: Urology

## 2020-01-16 ENCOUNTER — Encounter (HOSPITAL_BASED_OUTPATIENT_CLINIC_OR_DEPARTMENT_OTHER): Payer: Self-pay | Admitting: Urology

## 2020-01-16 ENCOUNTER — Ambulatory Visit (HOSPITAL_BASED_OUTPATIENT_CLINIC_OR_DEPARTMENT_OTHER): Payer: Medicare Other | Admitting: Certified Registered"

## 2020-01-16 ENCOUNTER — Ambulatory Visit (HOSPITAL_BASED_OUTPATIENT_CLINIC_OR_DEPARTMENT_OTHER)
Admission: RE | Admit: 2020-01-16 | Discharge: 2020-01-16 | Disposition: A | Discharge status: Home / Self Care | Payer: Medicare Other | Attending: Urology | Admitting: Urology

## 2020-01-16 ENCOUNTER — Other Ambulatory Visit: Payer: Self-pay

## 2020-01-16 DIAGNOSIS — Z8616 Personal history of COVID-19: Secondary | ICD-10-CM | POA: Diagnosis not present

## 2020-01-16 DIAGNOSIS — F419 Anxiety disorder, unspecified: Secondary | ICD-10-CM | POA: Diagnosis not present

## 2020-01-16 DIAGNOSIS — N131 Hydronephrosis with ureteral stricture, not elsewhere classified: Secondary | ICD-10-CM | POA: Diagnosis not present

## 2020-01-16 DIAGNOSIS — J449 Chronic obstructive pulmonary disease, unspecified: Secondary | ICD-10-CM | POA: Insufficient documentation

## 2020-01-16 DIAGNOSIS — Z466 Encounter for fitting and adjustment of urinary device: Secondary | ICD-10-CM | POA: Diagnosis not present

## 2020-01-16 DIAGNOSIS — I129 Hypertensive chronic kidney disease with stage 1 through stage 4 chronic kidney disease, or unspecified chronic kidney disease: Secondary | ICD-10-CM | POA: Insufficient documentation

## 2020-01-16 DIAGNOSIS — M1990 Unspecified osteoarthritis, unspecified site: Secondary | ICD-10-CM | POA: Insufficient documentation

## 2020-01-16 DIAGNOSIS — N183 Chronic kidney disease, stage 3 unspecified: Secondary | ICD-10-CM | POA: Insufficient documentation

## 2020-01-16 DIAGNOSIS — N1330 Unspecified hydronephrosis: Secondary | ICD-10-CM | POA: Diagnosis not present

## 2020-01-16 HISTORY — DX: Gastro-esophageal reflux disease without esophagitis: K21.9

## 2020-01-16 HISTORY — DX: Abnormal electrocardiogram (ECG) (EKG): R94.31

## 2020-01-16 HISTORY — PX: CYSTOSCOPY W/ URETERAL STENT PLACEMENT: SHX1429

## 2020-01-16 LAB — POCT I-STAT, CHEM 8
BUN: 23 mg/dL (ref 8–23)
Calcium, Ion: 1.32 mmol/L (ref 1.15–1.40)
Chloride: 101 mmol/L (ref 98–111)
Creatinine, Ser: 1.7 mg/dL — ABNORMAL HIGH (ref 0.61–1.24)
Glucose, Bld: 104 mg/dL — ABNORMAL HIGH (ref 70–99)
HCT: 39 % (ref 39.0–52.0)
Hemoglobin: 13.3 g/dL (ref 13.0–17.0)
Potassium: 4.3 mmol/L (ref 3.5–5.1)
Sodium: 138 mmol/L (ref 135–145)
TCO2: 25 mmol/L (ref 22–32)

## 2020-01-16 SURGERY — CYSTOSCOPY, WITH RETROGRADE PYELOGRAM AND URETERAL STENT INSERTION
Anesthesia: General | Site: Ureter | Laterality: Left

## 2020-01-16 MED ORDER — OXYCODONE HCL 5 MG PO TABS: 5.0000 mg | ORAL_TABLET | Freq: Once | ORAL | Status: DC | PRN

## 2020-01-16 MED ORDER — OXYCODONE HCL 5 MG/5ML PO SOLN: 5.0000 mg | Freq: Once | ORAL | Status: DC | PRN

## 2020-01-16 MED ORDER — FENTANYL CITRATE (PF) 100 MCG/2ML IJ SOLN
INTRAMUSCULAR | Status: AC
  Filled 2020-01-16: qty 2

## 2020-01-16 MED ORDER — PROPOFOL 10 MG/ML IV BOLUS
INTRAVENOUS | Status: DC | PRN
  Administered 2020-01-16: 120 mg via INTRAVENOUS

## 2020-01-16 MED ORDER — FENTANYL CITRATE (PF) 100 MCG/2ML IJ SOLN: 25.0000 ug | INTRAMUSCULAR | Status: DC | PRN

## 2020-01-16 MED ORDER — LIDOCAINE 2% (20 MG/ML) 5 ML SYRINGE
INTRAMUSCULAR | Status: DC | PRN
  Administered 2020-01-16: 40 mg via INTRAVENOUS

## 2020-01-16 MED ORDER — IOHEXOL 300 MG/ML  SOLN
INTRAMUSCULAR | Status: DC | PRN
  Administered 2020-01-16: 20 mL via URETHRAL

## 2020-01-16 MED ORDER — ONDANSETRON HCL 4 MG/2ML IJ SOLN
INTRAMUSCULAR | Status: AC
  Filled 2020-01-16: qty 2

## 2020-01-16 MED ORDER — GENTAMICIN SULFATE 40 MG/ML IJ SOLN
5.0000 mg/kg | INTRAVENOUS | Status: AC
  Administered 2020-01-16: 360 mg via INTRAVENOUS
  Filled 2020-01-16: qty 9

## 2020-01-16 MED ORDER — PROPOFOL 10 MG/ML IV BOLUS
INTRAVENOUS | Status: AC
  Filled 2020-01-16: qty 40

## 2020-01-16 MED ORDER — SODIUM CHLORIDE 0.9 % IV SOLN: INTRAVENOUS | Status: DC

## 2020-01-16 MED ORDER — TRAMADOL HCL 50 MG PO TABS: 50.0000 mg | ORAL_TABLET | Freq: Four times a day (QID) | ORAL | 0 refills | Status: DC | PRN

## 2020-01-16 MED ORDER — EPHEDRINE 5 MG/ML INJ
INTRAVENOUS | Status: AC
  Filled 2020-01-16: qty 10

## 2020-01-16 MED ORDER — LIDOCAINE 2% (20 MG/ML) 5 ML SYRINGE
INTRAMUSCULAR | Status: AC
  Filled 2020-01-16: qty 5

## 2020-01-16 MED ORDER — ONDANSETRON HCL 4 MG/2ML IJ SOLN: 4.0000 mg | Freq: Once | INTRAMUSCULAR | Status: DC | PRN

## 2020-01-16 MED ORDER — FENTANYL CITRATE (PF) 100 MCG/2ML IJ SOLN
INTRAMUSCULAR | Status: DC | PRN
  Administered 2020-01-16: 25 ug via INTRAVENOUS

## 2020-01-16 MED ORDER — ONDANSETRON HCL 4 MG/2ML IJ SOLN
INTRAMUSCULAR | Status: DC | PRN
  Administered 2020-01-16: 4 mg via INTRAVENOUS

## 2020-01-16 SURGICAL SUPPLY — 22 items
BAG DRAIN URO-CYSTO SKYTR STRL (DRAIN) ×3 IMPLANT
BAG DRN UROCATH (DRAIN) ×1
CATH INTERMIT  6FR 70CM (CATHETERS) ×2 IMPLANT
CLOTH BEACON ORANGE TIMEOUT ST (SAFETY) ×3 IMPLANT
GLOVE BIO SURGEON STRL SZ7.5 (GLOVE) ×3 IMPLANT
GLOVE BIOGEL PI IND STRL 6.5 (GLOVE) IMPLANT
GLOVE BIOGEL PI IND STRL 7.0 (GLOVE) IMPLANT
GLOVE BIOGEL PI INDICATOR 6.5 (GLOVE) ×4
GLOVE BIOGEL PI INDICATOR 7.0 (GLOVE) ×2
GLOVE SURG SS PI 6.5 STRL IVOR (GLOVE) ×2 IMPLANT
GOWN STRL REUS W/TWL LRG LVL3 (GOWN DISPOSABLE) ×9 IMPLANT
GUIDEWIRE ANG ZIPWIRE 038X150 (WIRE) ×3 IMPLANT
IV NS IRRIG 3000ML ARTHROMATIC (IV SOLUTION) ×3 IMPLANT
KIT TURNOVER CYSTO (KITS) ×3 IMPLANT
MANIFOLD NEPTUNE II (INSTRUMENTS) ×3 IMPLANT
NS IRRIG 500ML POUR BTL (IV SOLUTION) ×3 IMPLANT
STENT CONTOUR 7FRX26 (STENTS) ×2 IMPLANT
SYR 10ML LL (SYRINGE) ×3 IMPLANT
TRAY CYSTO PACK (CUSTOM PROCEDURE TRAY) ×3 IMPLANT
TUBE CONNECTING 12'X1/4 (SUCTIONS) ×1
TUBE CONNECTING 12X1/4 (SUCTIONS) ×2 IMPLANT
WATER STERILE IRR 3000ML UROMA (IV SOLUTION) ×2 IMPLANT

## 2020-01-16 NOTE — Transfer of Care (Signed)
Immediate Anesthesia Transfer of Care Note  Patient: Olegario Shearer  Procedure(s) Performed: Procedure(s) (LRB): CYSTOSCOPY WITH RETROGRADE PYELOGRAM/URETERAL STENT EXCHANGED (Left)  Patient Location: PACU  Anesthesia Type: General  Level of Consciousness: awake, oriented, sedated and patient cooperative  Airway & Oxygen Therapy: Patient Spontanous Breathing and Patient connected to face mask oxygen  Post-op Assessment: Report given to PACU RN and Post -op Vital signs reviewed and stable  Post vital signs: Reviewed and stable  Complications: No apparent anesthesia complications  Last Vitals:  Vitals Value Taken Time  BP 113/70 01/16/20 1100  Temp    Pulse 83 01/16/20 1106  Resp 19 01/16/20 1106  SpO2 96 % 01/16/20 1106  Vitals shown include unvalidated device data.  Last Pain:  Vitals:   01/16/20 1004  TempSrc: Oral  PainSc: 0-No pain      Patients Stated Pain Goal: 5 (01/16/20 1004)

## 2020-01-16 NOTE — Anesthesia Postprocedure Evaluation (Signed)
Anesthesia Post Note  Patient: DOMONICK SITTNER  Procedure(s) Performed: CYSTOSCOPY WITH RETROGRADE PYELOGRAM/URETERAL STENT EXCHANGED (Left Ureter)     Patient location during evaluation: PACU Anesthesia Type: General Level of consciousness: awake and alert Pain management: pain level controlled Vital Signs Assessment: post-procedure vital signs reviewed and stable Respiratory status: spontaneous breathing, nonlabored ventilation, respiratory function stable and patient connected to nasal cannula oxygen Cardiovascular status: blood pressure returned to baseline and stable Postop Assessment: no apparent nausea or vomiting Anesthetic complications: no    Last Vitals:  Vitals:   01/16/20 1130 01/16/20 1157  BP: (!) 115/57 126/69  Pulse: 70 68  Resp: 12 16  Temp:  36.5 C  SpO2: 92% 94%    Last Pain:  Vitals:   01/16/20 1157  TempSrc: Oral  PainSc: 0-No pain                 Effie Berkshire

## 2020-01-16 NOTE — Anesthesia Preprocedure Evaluation (Addendum)
Anesthesia Evaluation  Patient identified by MRN, date of birth, ID band Patient awake    Reviewed: Allergy & Precautions, NPO status , Patient's Chart, lab work & pertinent test results  History of Anesthesia Complications Negative for: history of anesthetic complications  Airway Mallampati: I  TM Distance: >3 FB Neck ROM: Full    Dental  (+) Edentulous Upper, Edentulous Lower   Pulmonary COPD, former smoker,    Pulmonary exam normal        Cardiovascular hypertension, Normal cardiovascular exam+ Valvular Problems/Murmurs AI      Neuro/Psych Anxiety Depression negative neurological ROS     GI/Hepatic Neg liver ROS, PUD, GERD  ,  Endo/Other  negative endocrine ROS  Renal/GU Renal InsufficiencyRenal disease (left hydronephrosis, CKD III)  negative genitourinary   Musculoskeletal  (+) Arthritis ,   Abdominal   Peds  Hematology  (+) anemia ,   Anesthesia Other Findings  Echo 2018: EF 55-60%, mild AR, mild TR  Reproductive/Obstetrics                           Anesthesia Physical Anesthesia Plan  ASA: III  Anesthesia Plan: General   Post-op Pain Management:    Induction: Intravenous  PONV Risk Score and Plan: 2 and Ondansetron, Dexamethasone, Midazolam and Treatment may vary due to age or medical condition  Airway Management Planned: LMA  Additional Equipment: None  Intra-op Plan:   Post-operative Plan: Extubation in OR  Informed Consent: I have reviewed the patients History and Physical, chart, labs and discussed the procedure including the risks, benefits and alternatives for the proposed anesthesia with the patient or authorized representative who has indicated his/her understanding and acceptance.     Dental advisory given  Plan Discussed with:   Anesthesia Plan Comments:        Anesthesia Quick Evaluation

## 2020-01-16 NOTE — Op Note (Signed)
NAME: BEACHER, EVERY MEDICAL RECORD [id number] ACCOUNT [id number] DATE OF BIRTH:Mar 11, 1931 FACILITY: [facility] LOCATION: [facility] PHYSICIAN:Glendene Wyer Tresa Moore, MD  OPERATIVE REPORT  DATE OF PROCEDURE:  01/16/2020  SURGEON:  [name], MD  PREOPERATIVE DIAGNOSIS:  Chronic left ureteral stricture.  PROCEDURE: 1.  Cystoscopy, left retrograde pyelogram, interpretation. 2.  Exchange of left ureteral stent, 7 x 26 Contour, no tether.  ESTIMATED BLOOD LOSS:  Nil.  MEDICATIONS:  None.  SPECIMENS:  Left ureteral stent for discard, inspected and intact.  FINDINGS: 1.  Persistent left distal ureteral stricture, high-grade with mild hydronephrosis. 2.  Successful replacement of left ureteral stent, proximal end in renal pelvis, distal end in urinary bladder. 3.  Distal shaft hypospadias.  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  He is quite comorbid.  He is not an operative candidate for definitive repair.  He has been managed with chronic stenting changed  every 6-12 months and has been very compliant with this.  He is overdue for a stent exchange due to the pandemic and presents for this today.  Informed consent was obtained and placed in the medical record.  DESCRIPTION OF PROCEDURE:  The patient is being identified, the procedure being left ureteral stent change was confirmed.  Procedure timeout was performed.  Intravenous antibiotic administered.  General anesthesia induced.  The patient was placed into a  low lithotomy position, sterile field was created, prepped and draped base of the penis, perineum and proximal thighs using iodine.  The patient's known glanular hypospadias was identified.  Cystourethroscopy was then performed using 21-French rigid  cystoscope with offset lens.  Inspection of anterior and posterior urethra revealed a prior TUR defect.  Inspection of bladder revealed distal end of  ureteral stent in situ.  There was minimal encrustation, but clearly some. The stent was grasped,  brought out in its entirety, set aside for discard.  It was inspected and intact.  The left ureteral orifice was then cannulated with a 6-French renal catheter and left retrograde pyelogram was obtained.  Left retrograde pyelogram demonstrated a single left ureter with a somewhat malrotated single system left kidney.  There was persistent left distal ureteral stricture encompassing approximately the distal one-quarter of the left ureter with some  tortuosity and hydronephrosis above this that was moderate.  A 0.03 ZIPwire was advanced to the lower pole and a new 7 x 26 Contour Polaris-type stent was then placed over the working wire.  Good proximal and distal planes were noted.  The bladder was  entered per cystoscope.  Procedure was then terminated.  The patient tolerated the procedure well.  No immediate perioperative complications.  The patient was taken to postanesthesia care unit in stable condition.  Plan for discharge home.  VN/NUANCE  D:01/16/2020 T:01/16/2020 JOB:[id number]

## 2020-01-16 NOTE — Anesthesia Procedure Notes (Signed)
Procedure Name: LMA Insertion Date/Time: 01/16/2020 10:34 AM Performed by: Suan Halter, CRNA Pre-anesthesia Checklist: Patient identified, Emergency Drugs available, Suction available and Patient being monitored Patient Re-evaluated:Patient Re-evaluated prior to induction Oxygen Delivery Method: Circle system utilized Preoxygenation: Pre-oxygenation with 100% oxygen Induction Type: IV induction Ventilation: Mask ventilation without difficulty LMA: LMA inserted LMA Size: 4.0 Number of attempts: 1 Airway Equipment and Method: Bite block Placement Confirmation: positive ETCO2 Tube secured with: Tape Dental Injury: Teeth and Oropharynx as per pre-operative assessment

## 2020-01-16 NOTE — OR Nursing (Signed)
Left ureteral stent removed by Dr. Manny. 

## 2020-01-16 NOTE — Brief Op Note (Signed)
01/16/2020  10:50 AM  PATIENT:  Steven Cain  86 y.o. male  PRE-OPERATIVE DIAGNOSIS:  LEFT HYDRONEPHROSIS  POST-OPERATIVE DIAGNOSIS:  LEFT HYDRONEPHROSIS  PROCEDURE:  Procedure(s) with comments: CYSTOSCOPY WITH RETROGRADE PYELOGRAM/URETERAL STENT PLACEMENT (Left) - 30 MINS  SURGEON:  Surgeon(s) and Role:    * [name], MD - Primary  PHYSICIAN ASSISTANT:   ASSISTANTS: none   ANESTHESIA:   general  EBL:  0 mL   BLOOD ADMINISTERED:none  DRAINS: none   LOCAL MEDICATIONS USED:  NONE  SPECIMEN:  Source of Specimen:  Left ureteral stent  DISPOSITION OF SPECIMEN:  left ureteral stent  COUNTS:  YES  TOURNIQUET:  * No tourniquets in log *  DICTATION: .Other Dictation: Dictation Number [id number]  PLAN OF CARE: Discharge to home after PACU  PATIENT DISPOSITION:  PACU - hemodynamically stable.   Delay start of Pharmacological VTE agent (>24hrs) due to surgical blood loss or risk of bleeding: yes

## 2020-01-16 NOTE — Discharge Instructions (Signed)
1 - You may have urinary urgency (bladder spasms) and bloody urine on / off with stent in place. This is normal. ° °2 - Call MD or go to ER for fever >102, severe pain / nausea / vomiting not relieved by medications, or acute change in medical status ° °Ureteral Stent Implantation, Care After °This sheet gives you information about how to care for yourself after your procedure. Your health care provider may also give you more specific instructions. If you have problems or questions, contact your health care provider. °What can I expect after the procedure? °After the procedure, it is common to have: °· Nausea. °· Mild pain when you urinate. You may feel this pain in your lower back or lower abdomen. The pain should stop within a few minutes after you urinate. This may last for up to 1 week. °· A small amount of blood in your urine for several days. °Follow these instructions at home: °Medicines °· Take over-the-counter and prescription medicines only as told by your health care provider. °· If you were prescribed an antibiotic medicine, take it as told by your health care provider. Do not stop taking the antibiotic even if you start to feel better. °· Do not drive for 24 hours if you were given a sedative during your procedure. °· Ask your health care provider if the medicine prescribed to you requires you to avoid driving or using heavy machinery. °Activity °· Rest as told by your health care provider. °· Avoid sitting for a long time without moving. Get up to take short walks every 1-2 hours. This is important to improve blood flow and breathing. Ask for help if you feel weak or unsteady. °· Return to your normal activities as told by your health care provider. Ask your health care provider what activities are safe for you. °General instructions ° °· Watch for any blood in your urine. Call your health care provider if the amount of blood in your urine increases. °· If you have a catheter: °? Follow instructions  from your health care provider about taking care of your catheter and collection bag. °? Do not take baths, swim, or use a hot tub until your health care provider approves. Ask your health care provider if you may take showers. You may only be allowed to take sponge baths. °· Drink enough fluid to keep your urine pale yellow. °· Do not use any products that contain nicotine or tobacco, such as cigarettes, e-cigarettes, and chewing tobacco. These can delay healing after surgery. If you need help quitting, ask your health care provider. °· Keep all follow-up visits as told by your health care provider. This is important. °Contact a health care provider if: °· You have pain that gets worse or does not get better with medicine, especially pain when you urinate. °· You have difficulty urinating. °· You feel nauseous or you vomit repeatedly during a period of more than 2 days after the procedure. °Get help right away if: °· Your urine is dark red or has blood clots in it. °· You are leaking urine (have incontinence). °· The end of the stent comes out of your urethra. °· You cannot urinate. °· You have sudden, sharp, or severe pain in your abdomen or lower back. °· You have a fever. °· You have swelling or pain in your legs. °· You have difficulty breathing. °Summary °· After the procedure, it is common to have mild pain when you urinate that goes away within a   few minutes after you urinate. This may last for up to 1 week. °· Watch for any blood in your urine. Call your health care provider if the amount of blood in your urine increases. °· Take over-the-counter and prescription medicines only as told by your health care provider. °· Drink enough fluid to keep your urine pale yellow. °This information is not intended to replace advice given to you by your health care provider. Make sure you discuss any questions you have with your health care provider. °Document Revised: 05/07/2018 Document Reviewed: 05/08/2018 °Elsevier  Patient Education © 2020 [employer] ° °Post Anesthesia Home Care Instructions ° °Activity: °Get plenty of rest for the remainder of the day. A responsible individual must stay with you for 24 hours following the procedure.  °For the next 24 hours, DO NOT: °-Drive a car °-Operate machinery °-Drink alcoholic beverages °-Take any medication unless instructed by your physician °-Make any legal decisions or sign important papers. ° °Meals: °Start with liquid foods such as gelatin or soup. Progress to regular foods as tolerated. Avoid greasy, spicy, heavy foods. If nausea and/or vomiting occur, drink only clear liquids until the nausea and/or vomiting subsides. Call your physician if vomiting continues. ° °Special Instructions/Symptoms: °Your throat may feel dry or sore from the anesthesia or the breathing tube placed in your throat during surgery. If this causes discomfort, gargle with warm salt water. The discomfort should disappear within 24 hours. °   ° ° ° °

## 2020-01-16 NOTE — H&P (Signed)
Steven Cain is an 86 y.o. male.    Chief Complaint: Pre-OP LEFT Ureteral Stent Exchange  HPI:   1 - Obstructing Left Ureteral Stricture - worrisome soft tissue thickening of distal left ureter by CT and operative retrograde pyelogram 05/06/14. Now s/p stenting. No bladder lesion or additional lesions by imaging. He has h/o extensive smokeless tobacco exposure. He is NOT candidate for definitive repair.    Recent Course:   05/2014 - Left Ureteroscopy / biopsy / brushing / washing / stent change (6x26)- path just mild atypia only, no frank carcinoma,  10/2014 - Repeat Left Ureteroscopy / Biopsy / stent exchange - path benign, no progression of stricture; 03/2015 Left stent change (7x26), no intraluminal filling defects  09/2015 - Stent change 7x26;  10/2016 - Stent change 7/26;  09/2017 - Stent change 7x26 contour; 04/2018 - Stent exchange 7x26 contour, Cr 1.6  01/2019 - Stent exchange 7x26 contour    2 - Incontinence - Long h/o near total incontinence s/p TURP previously by Dr. Rosana Hoes. Manages with adult diapers x years.   PMH sig for Billroth II procedure R+Y, bilateral inguinal hernia repair with mesh, alcoholism (now very involved and compliant with AA) His PCP is Dr. Virgina Jock. 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    Today [name] is seen for LEFT ureteral stent excahnge. No interval fevers. C19 screen negative.   Past Medical History:  Diagnosis Date  . Abnormal EKG    hx of left anterior fasicular block on ekg, no cardiologist  . Anxiety   . AR (aortic regurgitation) 03/23/2017   Mild, Noted on ECHO  . Arthritis    DJD  . Benign enlargement of prostate    frequent UTI, trim of prostate done , with some issues of incontinence now.  . Chronic bronchitis ([place])    chronic- usually has phelgm often  . COVID-19 08/2019   fatigue and loss of taste and smell x 10 days did monoclonal antibody therapy  . Depression   . Dyspnea    with excertion  . Fluid  retention in legs    resolved  . GERD (gastroesophageal reflux disease)    rare  . GI bleed   . Grade I diastolic dysfunction   . Hard of hearing    hear more on left side- no hearing aid  . History of colon polyps   . History of transfusion 2016  . Hydronephrosis 2018   Left ckd stage 3 no nephrologist per wife [place]  . Hypertension   . Moderate concentric left ventricular hypertrophy 03/23/2017   Noted on ECHO  . Perforated, severe stomach ulcer ([place]) 1974  . Peripheral neuropathy    feet and toes  . Spinal stenosis   . TR (tricuspid regurgitation) 03/23/2017   Mild, Noted on ECHO  . Transfusion history    none recent  . Ureteral stricture    CHRONIC  . Urinary incontinence   . UTI (urinary tract infection)    frequent  . Wears glasses     Past Surgical History:  Procedure Laterality Date  . ABDOMINAL SURGERY     bilroths tube '74  . COLONOSCOPY N/A 09/29/2014   Procedure: COLONOSCOPY;  Surgeon: Beryle Beams, MD;  Location: [facility];  Service: Endoscopy;  Laterality: N/A;  . CYSTOSCOPY W/ URETERAL STENT PLACEMENT Left 05/06/2014   Procedure: CYSTOSCOPY WITH RETROGRADE PYELOGRAM/URETERAL STENT PLACEMENT;  Surgeon: [name], MD;  Location: [facility];  Service: Urology;  Laterality:  Left;  . CYSTOSCOPY W/ URETERAL STENT PLACEMENT Left 03/31/2015   Procedure: CYSTOSCOPY WITH LEFT RETROGRADE PYELOGRAM/URETERAL STENT EXCHANGE;  Surgeon: [name], MD;  Location: [facility];  Service: Urology;  Laterality: Left;  . CYSTOSCOPY W/ URETERAL STENT PLACEMENT Left 09/15/2015   Procedure: CYSTOSCOPY WITH LEFT RETROGRADE PYELOGRAM/URETERAL STENT EXCHANGE ;  Surgeon: [name], MD;  Location: [facility];  Service: Urology;  Laterality: Left;  . CYSTOSCOPY W/ URETERAL STENT PLACEMENT Left 04/28/2016   Procedure: CYSTOSCOPY WITH RETROGRADE PYELOGRAM/URETERAL STENT EXCHANGE;  Surgeon: [name], MD;  Location: [facility];  Service: Urology;  Laterality: Left;  . CYSTOSCOPY W/  URETERAL STENT PLACEMENT Left 11/08/2016   Procedure: CYSTOSCOPY WITH RETROGRADE PYELOGRAM/URETERAL STENT EXCHANGE;  Surgeon: [name], MD;  Location: [facility];  Service: Urology;  Laterality: Left;  . CYSTOSCOPY W/ URETERAL STENT PLACEMENT Left 09/14/2017   Procedure: CYSTOSCOPY WITH RETROGRADE PYELOGRAM/URETERAL STENT PLACEMENT;  Surgeon: [name], MD;  Location: [facility];  Service: Urology;  Laterality: Left;  . CYSTOSCOPY W/ URETERAL STENT PLACEMENT Left 04/10/2018   Procedure: CYSTOSCOPY WITH LEFT RETROGRADE URETERAL STENT EXCHANGE;  Surgeon: [name], MD;  Location: [facility];  Service: Urology;  Laterality: Left;  . CYSTOSCOPY W/ URETERAL STENT PLACEMENT Left 01/31/2019   Procedure: CYSTOSCOPY WITH RETROGRADE PYELOGRAM/URETERAL STENT PLACEMENT;  Surgeon: [name], MD;  Location: [facility];  Service: Urology;  Laterality: Left;  45 MINS  . CYSTOSCOPY WITH RETROGRADE PYELOGRAM, URETEROSCOPY AND STENT PLACEMENT Left 10/21/2014   Procedure: CYSTOSCOPY WITH RETROGRADE PYELOGRAM, URETEROSCOPY,BIOPSY AND STENT CHANGE;  Surgeon: [name], MD;  Location: [facility];  Service: Urology;  Laterality: Left;  . CYSTOSCOPY/RETROGRADE/URETEROSCOPY Left 06/03/2014   Procedure: CYSTOSCOPY/RETROGRADE/URETEROSCOPY WITH BIOPSY,STENT EXCHANGE;  Surgeon: [name], MD;  Location: [facility];  Service: Urology;  Laterality: Left;  . ESOPHAGOGASTRODUODENOSCOPY N/A 09/28/2014   Procedure: ESOPHAGOGASTRODUODENOSCOPY (EGD);  Surgeon: Beryle Beams, MD;  Location: [facility];  Service: Endoscopy;  Laterality: N/A;  . GASTRIC ROUX-EN-Y     '74  . HERNIA REPAIR     bilateral hernia repairs   . TONSILLECTOMY  as child  . TOTAL HIP ARTHROPLASTY Left 05/19/2016   Procedure: LEFT TOTAL HIP ARTHROPLASTY ANTERIOR APPROACH;  Surgeon: Mcarthur Rossetti, MD;  Location: [facility];  Service: Orthopedics;  Laterality: Left;  . TRANSURETHRAL RESECTION OF PROSTATE    . VAGOTOMY  1950's     History reviewed. No pertinent family history. Social History:  reports that he has quit smoking. His smoking use included cigarettes. He has quit using smokeless tobacco.  His smokeless tobacco use included chew. He reports previous alcohol use. He reports that he does not use drugs.  Allergies: No Known Allergies  No medications prior to admission.    No results found for this or any previous visit (from the past 48 hour(s)). No results found.  Review of Systems  Constitutional: Positive for fatigue.  HENT: Negative.   Eyes: Negative.   Respiratory: Negative.   Cardiovascular: Negative.   Genitourinary: Negative.   Allergic/Immunologic: Negative.   Neurological: Negative.   Hematological: Negative.     Height 5\' 6"  (1.676 m), weight 85.3 kg. Physical Exam  Constitutional:  Continued functional declien, but AOx3.   Eyes: Pupils are equal, round, and reactive to light.  Cardiovascular: Normal rate.  Respiratory: Effort normal.  GI: Soft.  mulitpel scars.   Genitourinary:    Genitourinary Comments: Total inctoninence in diaper.    Musculoskeletal:        General: Normal range of  motion.     Cervical back: Normal range of motion.  Neurological: He is alert.  Skin: Skin is warm.  Psychiatric: He has a normal mood and affect.     Assessment/Plan  Proceed as planned with LEFT ureteral stent exchange. I continue to feel this is best means of management of his chronic stricture. Risks, benefits, alternatives, expected peri-op course discussed previously and reiterated today.   [name], MD 01/16/2020, 7:45 AM

## 2020-02-15 ENCOUNTER — Emergency Department (HOSPITAL_COMMUNITY): Payer: Medicare Other | Admitting: Anesthesiology

## 2020-02-15 ENCOUNTER — Other Ambulatory Visit: Payer: Self-pay

## 2020-02-15 ENCOUNTER — Inpatient Hospital Stay (HOSPITAL_COMMUNITY)
Admission: EM | Admit: 2020-02-15 | Discharge: 2020-02-19 | DRG: 339 | Disposition: A | Discharge status: Home Health | Payer: Medicare Other | Attending: General Surgery | Admitting: General Surgery

## 2020-02-15 ENCOUNTER — Encounter (HOSPITAL_COMMUNITY): Payer: Self-pay | Admitting: *Deleted

## 2020-02-15 ENCOUNTER — Emergency Department (HOSPITAL_COMMUNITY): Payer: Medicare Other

## 2020-02-15 ENCOUNTER — Encounter (HOSPITAL_COMMUNITY): Admission: EM | Disposition: A | Discharge status: Home Health | Payer: Self-pay | Source: Home / Self Care

## 2020-02-15 DIAGNOSIS — N179 Acute kidney failure, unspecified: Secondary | ICD-10-CM | POA: Diagnosis present

## 2020-02-15 DIAGNOSIS — H9190 Unspecified hearing loss, unspecified ear: Secondary | ICD-10-CM | POA: Diagnosis present

## 2020-02-15 DIAGNOSIS — N401 Enlarged prostate with lower urinary tract symptoms: Secondary | ICD-10-CM | POA: Diagnosis present

## 2020-02-15 DIAGNOSIS — Z8711 Personal history of peptic ulcer disease: Secondary | ICD-10-CM

## 2020-02-15 DIAGNOSIS — I129 Hypertensive chronic kidney disease with stage 1 through stage 4 chronic kidney disease, or unspecified chronic kidney disease: Secondary | ICD-10-CM | POA: Diagnosis not present

## 2020-02-15 DIAGNOSIS — K3532 Acute appendicitis with perforation and localized peritonitis, without abscess: Secondary | ICD-10-CM | POA: Diagnosis not present

## 2020-02-15 DIAGNOSIS — Z9884 Bariatric surgery status: Secondary | ICD-10-CM

## 2020-02-15 DIAGNOSIS — I351 Nonrheumatic aortic (valve) insufficiency: Secondary | ICD-10-CM | POA: Diagnosis not present

## 2020-02-15 DIAGNOSIS — E871 Hypo-osmolality and hyponatremia: Secondary | ICD-10-CM | POA: Diagnosis not present

## 2020-02-15 DIAGNOSIS — K3580 Unspecified acute appendicitis: Secondary | ICD-10-CM | POA: Diagnosis present

## 2020-02-15 DIAGNOSIS — N1832 Chronic kidney disease, stage 3b: Secondary | ICD-10-CM | POA: Diagnosis not present

## 2020-02-15 DIAGNOSIS — K219 Gastro-esophageal reflux disease without esophagitis: Secondary | ICD-10-CM | POA: Diagnosis not present

## 2020-02-15 DIAGNOSIS — Z87891 Personal history of nicotine dependence: Secondary | ICD-10-CM

## 2020-02-15 DIAGNOSIS — N39498 Other specified urinary incontinence: Secondary | ICD-10-CM | POA: Diagnosis present

## 2020-02-15 DIAGNOSIS — F329 Major depressive disorder, single episode, unspecified: Secondary | ICD-10-CM | POA: Diagnosis present

## 2020-02-15 DIAGNOSIS — F419 Anxiety disorder, unspecified: Secondary | ICD-10-CM | POA: Diagnosis present

## 2020-02-15 DIAGNOSIS — Z87440 Personal history of urinary (tract) infections: Secondary | ICD-10-CM

## 2020-02-15 DIAGNOSIS — G629 Polyneuropathy, unspecified: Secondary | ICD-10-CM | POA: Diagnosis present

## 2020-02-15 DIAGNOSIS — Z79899 Other long term (current) drug therapy: Secondary | ICD-10-CM

## 2020-02-15 DIAGNOSIS — I071 Rheumatic tricuspid insufficiency: Secondary | ICD-10-CM | POA: Diagnosis present

## 2020-02-15 DIAGNOSIS — N135 Crossing vessel and stricture of ureter without hydronephrosis: Secondary | ICD-10-CM | POA: Diagnosis present

## 2020-02-15 DIAGNOSIS — Z7982 Long term (current) use of aspirin: Secondary | ICD-10-CM

## 2020-02-15 DIAGNOSIS — J42 Unspecified chronic bronchitis: Secondary | ICD-10-CM | POA: Diagnosis not present

## 2020-02-15 DIAGNOSIS — K37 Unspecified appendicitis: Secondary | ICD-10-CM | POA: Diagnosis present

## 2020-02-15 DIAGNOSIS — Z86010 Personal history of colonic polyps: Secondary | ICD-10-CM

## 2020-02-15 DIAGNOSIS — Z20822 Contact with and (suspected) exposure to covid-19: Secondary | ICD-10-CM | POA: Diagnosis present

## 2020-02-15 DIAGNOSIS — R1033 Periumbilical pain: Secondary | ICD-10-CM | POA: Diagnosis not present

## 2020-02-15 DIAGNOSIS — M1990 Unspecified osteoarthritis, unspecified site: Secondary | ICD-10-CM | POA: Diagnosis present

## 2020-02-15 HISTORY — PX: LAPAROSCOPIC APPENDECTOMY: SHX408

## 2020-02-15 LAB — COMPREHENSIVE METABOLIC PANEL
ALT: 11 U/L (ref 0–44)
AST: 17 U/L (ref 15–41)
Albumin: 3.7 g/dL (ref 3.5–5.0)
Alkaline Phosphatase: 68 U/L (ref 38–126)
Anion gap: 11 (ref 5–15)
BUN: 25 mg/dL — ABNORMAL HIGH (ref 8–23)
CO2: 22 mmol/L (ref 22–32)
Calcium: 9.2 mg/dL (ref 8.9–10.3)
Chloride: 98 mmol/L (ref 98–111)
Creatinine, Ser: 1.78 mg/dL — ABNORMAL HIGH (ref 0.61–1.24)
GFR calc Af Amer: 39 mL/min — ABNORMAL LOW (ref >60)
GFR calc non Af Amer: 34 mL/min — ABNORMAL LOW (ref >60)
Glucose, Bld: 155 mg/dL — ABNORMAL HIGH (ref 70–99)
Potassium: 4.3 mmol/L (ref 3.5–5.1)
Sodium: 131 mmol/L — ABNORMAL LOW (ref 135–145)
Total Bilirubin: 0.9 mg/dL (ref 0.3–1.2)
Total Protein: 7.6 g/dL (ref 6.5–8.1)

## 2020-02-15 LAB — CBC
HCT: 37.7 % — ABNORMAL LOW (ref 39.0–52.0)
Hemoglobin: 12.4 g/dL — ABNORMAL LOW (ref 13.0–17.0)
MCH: 28.6 pg (ref 26.0–34.0)
MCHC: 32.9 g/dL (ref 30.0–36.0)
MCV: 86.9 fL (ref 80.0–100.0)
Platelets: 218 10*3/uL (ref 150–400)
RBC: 4.34 MIL/uL (ref 4.22–5.81)
RDW: 14.8 % (ref 11.5–15.5)
WBC: 17.1 10*3/uL — ABNORMAL HIGH (ref 4.0–10.5)
nRBC: 0 % (ref 0.0–0.2)

## 2020-02-15 LAB — LIPASE, BLOOD: Lipase: 20 U/L (ref 11–51)

## 2020-02-15 LAB — SARS CORONAVIRUS 2 BY RT PCR (HOSPITAL ORDER, PERFORMED IN ~~LOC~~ HOSPITAL LAB): SARS Coronavirus 2: NEGATIVE

## 2020-02-15 LAB — LACTIC ACID, PLASMA: Lactic Acid, Venous: 1.3 mmol/L (ref 0.5–1.9)

## 2020-02-15 SURGERY — APPENDECTOMY, LAPAROSCOPIC
Anesthesia: General | Site: Abdomen

## 2020-02-15 MED ORDER — SODIUM CHLORIDE 0.9 % IV SOLN: INTRAVENOUS | Status: DC

## 2020-02-15 MED ORDER — SODIUM CHLORIDE (PF) 0.9 % IJ SOLN
INTRAMUSCULAR | Status: AC
  Filled 2020-02-15: qty 50

## 2020-02-15 MED ORDER — SODIUM CHLORIDE 0.9 % IV SOLN
INTRAVENOUS | Status: DC
  Administered 2020-02-15: 10 mL/h via INTRAVENOUS

## 2020-02-15 MED ORDER — AMLODIPINE BESYLATE 10 MG PO TABS
10.0000 mg | ORAL_TABLET | Freq: Every day | ORAL | Status: DC
  Administered 2020-02-16 – 2020-02-19 (×4): 10 mg via ORAL
  Filled 2020-02-15 (×4): qty 1

## 2020-02-15 MED ORDER — IOHEXOL 300 MG/ML  SOLN
100.0000 mL | Freq: Once | INTRAMUSCULAR | Status: AC | PRN
  Administered 2020-02-15: 75 mL via INTRAVENOUS

## 2020-02-15 MED ORDER — HEPARIN SODIUM (PORCINE) 5000 UNIT/ML IJ SOLN
5000.0000 [IU] | Freq: Three times a day (TID) | INTRAMUSCULAR | Status: DC
  Administered 2020-02-16 – 2020-02-19 (×10): 5000 [IU] via SUBCUTANEOUS
  Filled 2020-02-15 (×10): qty 1

## 2020-02-15 MED ORDER — ONDANSETRON HCL 4 MG/2ML IJ SOLN
4.0000 mg | Freq: Four times a day (QID) | INTRAMUSCULAR | Status: DC | PRN
  Administered 2020-02-17 – 2020-02-18 (×3): 4 mg via INTRAVENOUS
  Filled 2020-02-15 (×3): qty 2

## 2020-02-15 MED ORDER — 0.9 % SODIUM CHLORIDE (POUR BTL) OPTIME
TOPICAL | Status: DC | PRN
  Administered 2020-02-15: 1000 mL

## 2020-02-15 MED ORDER — METRONIDAZOLE IN NACL 5-0.79 MG/ML-% IV SOLN
500.0000 mg | Freq: Three times a day (TID) | INTRAVENOUS | Status: DC
  Administered 2020-02-15 – 2020-02-16 (×4): 500 mg via INTRAVENOUS
  Filled 2020-02-15 (×4): qty 100

## 2020-02-15 MED ORDER — LISINOPRIL 5 MG PO TABS
5.0000 mg | ORAL_TABLET | Freq: Every day | ORAL | Status: DC
  Administered 2020-02-16 – 2020-02-19 (×4): 5 mg via ORAL
  Filled 2020-02-15 (×4): qty 1

## 2020-02-15 MED ORDER — SUGAMMADEX SODIUM 200 MG/2ML IV SOLN
INTRAVENOUS | Status: DC | PRN
  Administered 2020-02-15: 200 mg via INTRAVENOUS

## 2020-02-15 MED ORDER — PAROXETINE HCL 20 MG PO TABS
40.0000 mg | ORAL_TABLET | Freq: Every day | ORAL | Status: DC
  Administered 2020-02-15 – 2020-02-18 (×4): 40 mg via ORAL
  Filled 2020-02-15 (×4): qty 2

## 2020-02-15 MED ORDER — ROCURONIUM BROMIDE 10 MG/ML (PF) SYRINGE
PREFILLED_SYRINGE | INTRAVENOUS | Status: AC
  Filled 2020-02-15: qty 10

## 2020-02-15 MED ORDER — DEXAMETHASONE SODIUM PHOSPHATE 10 MG/ML IJ SOLN
INTRAMUSCULAR | Status: AC
  Filled 2020-02-15: qty 1

## 2020-02-15 MED ORDER — SODIUM CHLORIDE 0.9% FLUSH
3.0000 mL | Freq: Once | INTRAVENOUS | Status: AC
  Administered 2020-02-15: 3 mL via INTRAVENOUS

## 2020-02-15 MED ORDER — LIDOCAINE 2% (20 MG/ML) 5 ML SYRINGE
INTRAMUSCULAR | Status: AC
  Filled 2020-02-15: qty 5

## 2020-02-15 MED ORDER — MORPHINE SULFATE (PF) 2 MG/ML IV SOLN: 1.0000 mg | INTRAVENOUS | Status: DC | PRN

## 2020-02-15 MED ORDER — ONDANSETRON HCL 4 MG/2ML IJ SOLN
INTRAMUSCULAR | Status: DC | PRN
  Administered 2020-02-15: 4 mg via INTRAVENOUS

## 2020-02-15 MED ORDER — PHENYLEPHRINE 40 MCG/ML (10ML) SYRINGE FOR IV PUSH (FOR BLOOD PRESSURE SUPPORT)
PREFILLED_SYRINGE | INTRAVENOUS | Status: DC | PRN
  Administered 2020-02-15: 200 ug via INTRAVENOUS

## 2020-02-15 MED ORDER — PIPERACILLIN-TAZOBACTAM 3.375 G IVPB 30 MIN
3.3750 g | Freq: Once | INTRAVENOUS | Status: AC
  Administered 2020-02-15: 3.375 g via INTRAVENOUS
  Filled 2020-02-15: qty 50

## 2020-02-15 MED ORDER — DEXAMETHASONE SODIUM PHOSPHATE 10 MG/ML IJ SOLN
INTRAMUSCULAR | Status: DC | PRN
  Administered 2020-02-15: 10 mg via INTRAVENOUS

## 2020-02-15 MED ORDER — LIDOCAINE 2% (20 MG/ML) 5 ML SYRINGE
INTRAMUSCULAR | Status: DC | PRN
  Administered 2020-02-15: 40 mg via INTRAVENOUS

## 2020-02-15 MED ORDER — PROPOFOL 10 MG/ML IV BOLUS
INTRAVENOUS | Status: AC
  Filled 2020-02-15: qty 20

## 2020-02-15 MED ORDER — ALBUTEROL SULFATE (2.5 MG/3ML) 0.083% IN NEBU
2.5000 mg | INHALATION_SOLUTION | Freq: Four times a day (QID) | RESPIRATORY_TRACT | Status: DC | PRN
  Administered 2020-02-17: 2.5 mg via RESPIRATORY_TRACT
  Filled 2020-02-15: qty 3

## 2020-02-15 MED ORDER — TRAMADOL HCL 50 MG PO TABS
50.0000 mg | ORAL_TABLET | Freq: Four times a day (QID) | ORAL | Status: DC | PRN
  Administered 2020-02-15 – 2020-02-17 (×4): 50 mg via ORAL
  Filled 2020-02-15 (×4): qty 1

## 2020-02-15 MED ORDER — PROPOFOL 10 MG/ML IV BOLUS
INTRAVENOUS | Status: DC | PRN
  Administered 2020-02-15: 70 mg via INTRAVENOUS

## 2020-02-15 MED ORDER — FENTANYL CITRATE (PF) 100 MCG/2ML IJ SOLN
INTRAMUSCULAR | Status: DC | PRN
  Administered 2020-02-15 (×2): 50 ug via INTRAVENOUS

## 2020-02-15 MED ORDER — LACTATED RINGERS IV SOLN: INTRAVENOUS | Status: DC | PRN

## 2020-02-15 MED ORDER — BUPIVACAINE-EPINEPHRINE 0.25% -1:200000 IJ SOLN
INTRAMUSCULAR | Status: AC
  Filled 2020-02-15: qty 1

## 2020-02-15 MED ORDER — FENTANYL CITRATE (PF) 100 MCG/2ML IJ SOLN
INTRAMUSCULAR | Status: AC
  Filled 2020-02-15: qty 2

## 2020-02-15 MED ORDER — PANTOPRAZOLE SODIUM 40 MG IV SOLR
40.0000 mg | Freq: Every day | INTRAVENOUS | Status: DC
  Administered 2020-02-15 – 2020-02-16 (×2): 40 mg via INTRAVENOUS
  Filled 2020-02-15 (×2): qty 40

## 2020-02-15 MED ORDER — HYDROCODONE-ACETAMINOPHEN 5-325 MG PO TABS: 1.0000 | ORAL_TABLET | ORAL | Status: DC | PRN

## 2020-02-15 MED ORDER — LACTATED RINGERS IV SOLN
INTRAVENOUS | Status: AC | PRN
  Administered 2020-02-15: 1000 mL

## 2020-02-15 MED ORDER — ONDANSETRON HCL 4 MG/2ML IJ SOLN
INTRAMUSCULAR | Status: AC
  Filled 2020-02-15: qty 2

## 2020-02-15 MED ORDER — FENTANYL CITRATE (PF) 100 MCG/2ML IJ SOLN
25.0000 ug | INTRAMUSCULAR | Status: DC | PRN
  Administered 2020-02-15 (×2): 25 ug via INTRAVENOUS

## 2020-02-15 MED ORDER — ALBUTEROL SULFATE HFA 108 (90 BASE) MCG/ACT IN AERS: 2.0000 | INHALATION_SPRAY | Freq: Four times a day (QID) | RESPIRATORY_TRACT | Status: DC | PRN

## 2020-02-15 MED ORDER — ROCURONIUM BROMIDE 10 MG/ML (PF) SYRINGE
PREFILLED_SYRINGE | INTRAVENOUS | Status: DC | PRN
  Administered 2020-02-15: 80 mg via INTRAVENOUS

## 2020-02-15 MED ORDER — SODIUM CHLORIDE 0.9 % IV SOLN
2.0000 g | INTRAVENOUS | Status: DC
  Administered 2020-02-15 – 2020-02-16 (×2): 2 g via INTRAVENOUS
  Filled 2020-02-15: qty 2
  Filled 2020-02-15: qty 20

## 2020-02-15 MED ORDER — ONDANSETRON 4 MG PO TBDP: 4.0000 mg | ORAL_TABLET | Freq: Four times a day (QID) | ORAL | Status: DC | PRN

## 2020-02-15 MED ORDER — BUPIVACAINE-EPINEPHRINE 0.25% -1:200000 IJ SOLN
INTRAMUSCULAR | Status: DC | PRN
  Administered 2020-02-15: 15 mL

## 2020-02-15 MED ORDER — ASPIRIN EC 81 MG PO TBEC
81.0000 mg | DELAYED_RELEASE_TABLET | Freq: Every day | ORAL | Status: DC
  Administered 2020-02-16 – 2020-02-19 (×4): 81 mg via ORAL
  Filled 2020-02-15 (×4): qty 1

## 2020-02-15 SURGICAL SUPPLY — 37 items
ADH SKN CLS APL DERMABOND .7 (GAUZE/BANDAGES/DRESSINGS) ×1
APL PRP STRL LF DISP 70% ISPRP (MISCELLANEOUS) ×1
APPLIER CLIP ROT 10 11.4 M/L (STAPLE)
APR CLP MED LRG 11.4X10 (STAPLE)
BAG SPEC RTRVL LRG 6X4 10 (ENDOMECHANICALS) ×1
CABLE HIGH FREQUENCY MONO STRZ (ELECTRODE) ×2 IMPLANT
CHLORAPREP W/TINT 26 (MISCELLANEOUS) ×2 IMPLANT
CLIP APPLIE ROT 10 11.4 M/L (STAPLE) IMPLANT
COVER WAND RF STERILE (DRAPES) IMPLANT
CUTTER FLEX LINEAR 45M (STAPLE) ×1 IMPLANT
DECANTER SPIKE VIAL GLASS SM (MISCELLANEOUS) ×2 IMPLANT
DERMABOND ADVANCED (GAUZE/BANDAGES/DRESSINGS) ×1
DERMABOND ADVANCED .7 DNX12 (GAUZE/BANDAGES/DRESSINGS) ×1 IMPLANT
ELECT REM PT RETURN 15FT ADLT (MISCELLANEOUS) ×2 IMPLANT
ENDOLOOP SUT PDS II  0 18 (SUTURE)
ENDOLOOP SUT PDS II 0 18 (SUTURE) IMPLANT
GLOVE BIO SURGEON STRL SZ7.5 (GLOVE) ×2 IMPLANT
GOWN STRL REUS W/TWL XL LVL3 (GOWN DISPOSABLE) ×4 IMPLANT
KIT BASIN (CUSTOM PROCEDURE TRAY) ×2 IMPLANT
KIT TURNOVER KIT A (KITS) IMPLANT
PENCIL SMOKE EVACUATOR (MISCELLANEOUS) IMPLANT
POUCH SPECIMEN RETRIEVAL 10MM (ENDOMECHANICALS) ×2 IMPLANT
RELOAD 45 THICK GREEN (ENDOMECHANICALS) ×2 IMPLANT
RELOAD 45 VASCULAR/THIN (ENDOMECHANICALS) IMPLANT
RELOAD STAPLE 45 3.5 BLU ETS (ENDOMECHANICALS) IMPLANT
RELOAD STAPLE 45 GRN THCK ETS (ENDOMECHANICALS) IMPLANT
RELOAD STAPLE TA45 3.5 REG BLU (ENDOMECHANICALS) IMPLANT
SCISSORS LAP 5X35 DISP (ENDOMECHANICALS) ×2 IMPLANT
SET IRRIG TUBING LAPAROSCOPIC (IRRIGATION / IRRIGATOR) ×2 IMPLANT
SET TUBE SMOKE EVAC HIGH FLOW (TUBING) ×2 IMPLANT
SHEARS HARMONIC ACE PLUS 36CM (ENDOMECHANICALS) ×2 IMPLANT
SUT MNCRL AB 4-0 PS2 18 (SUTURE) ×2 IMPLANT
TOWEL OR 17X26 10 PK STRL BLUE (TOWEL DISPOSABLE) ×2 IMPLANT
TRAY FOLEY MTR SLVR 16FR STAT (SET/KITS/TRAYS/PACK) ×1 IMPLANT
TRAY LAPAROSCOPIC (CUSTOM PROCEDURE TRAY) ×2 IMPLANT
TROCAR BLADELESS OPT 5 100 (ENDOMECHANICALS) ×3 IMPLANT
TROCAR XCEL BLUNT TIP 100MML (ENDOMECHANICALS) ×2 IMPLANT

## 2020-02-15 NOTE — H&P (Signed)
Steven Cain is an 86 y.o. male.   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 He began having abdominal pain yesterday. The pain has been fairly constant and did not improve overnight. He says he had some fever. He denies any nausea or vomiting. He came to the emergency department where a CT scan was suggestive of acute appendicitis. He does have a history of Roux-en-Y gastrojejunostomy for ulcer disease in 1974  Past Medical History:  Diagnosis Date  . Abnormal EKG    hx of left anterior fasicular block on ekg, no cardiologist  . Anxiety   . AR (aortic regurgitation) 03/23/2017   Mild, Noted on ECHO  . Arthritis    DJD  . Benign enlargement of prostate    frequent UTI, trim of prostate done , with some issues of incontinence now.  . Chronic bronchitis ([place])    chronic- usually has phelgm often  . COVID-19 08/2019   fatigue and loss of taste and smell x 10 days did monoclonal antibody therapy  . Depression   . Dyspnea    with excertion  . Fluid retention in legs    resolved  . GERD (gastroesophageal reflux disease)    rare  . GI bleed   . Grade I diastolic dysfunction   . Hard of hearing    hear more on left side- no hearing aid  . History of colon polyps   . History of transfusion 2016  . Hydronephrosis 2018   Left ckd stage 3 no nephrologist per wife adele  . Hypertension   . Moderate concentric left ventricular hypertrophy 03/23/2017   Noted on ECHO  . Perforated, severe stomach ulcer ([place]) 1974  . Peripheral neuropathy    feet and toes  . Spinal stenosis   . TR (tricuspid regurgitation) 03/23/2017   Mild, Noted on ECHO  . Transfusion history    none recent  . Ureteral stricture    CHRONIC  . Urinary incontinence   . UTI (urinary tract infection)    frequent  . Wears glasses     Past Surgical History:  Procedure Laterality Date  . ABDOMINAL SURGERY     bilroths tube '74  . COLONOSCOPY N/A  09/29/2014   Procedure: COLONOSCOPY;  Surgeon: Steven Beams, MD;  Location: [facility];  Service: Endoscopy;  Laterality: N/A;  . CYSTOSCOPY W/ URETERAL STENT PLACEMENT Left 05/06/2014   Procedure: CYSTOSCOPY WITH RETROGRADE PYELOGRAM/URETERAL STENT PLACEMENT;  Surgeon: Steven Frock, MD;  Location: [facility];  Service: Urology;  Laterality: Left;  . CYSTOSCOPY W/ URETERAL STENT PLACEMENT Left 03/31/2015   Procedure: CYSTOSCOPY WITH LEFT RETROGRADE PYELOGRAM/URETERAL STENT EXCHANGE;  Surgeon: Steven Frock, MD;  Location: [facility];  Service: Urology;  Laterality: Left;  . CYSTOSCOPY W/ URETERAL STENT PLACEMENT Left 09/15/2015   Procedure: CYSTOSCOPY WITH LEFT RETROGRADE PYELOGRAM/URETERAL STENT EXCHANGE ;  Surgeon: Steven Frock, MD;  Location: [facility];  Service: Urology;  Laterality: Left;  . CYSTOSCOPY W/ URETERAL STENT PLACEMENT Left 04/28/2016   Procedure: CYSTOSCOPY WITH RETROGRADE PYELOGRAM/URETERAL STENT EXCHANGE;  Surgeon: Steven Frock, MD;  [facility]: [facility];  Service: Urology;  Laterality: Left;  . CYSTOSCOPY W/ URETERAL STENT PLACEMENT Left 11/08/2016   Procedure: CYSTOSCOPY WITH RETROGRADE PYELOGRAM/URETERAL STENT EXCHANGE;  Surgeon: Steven Frock, MD;  [facility];  Service: Urology;  Laterality: Left;  . CYSTOSCOPY W/ URETERAL STENT PLACEMENT Left 09/14/2017   Procedure: CYSTOSCOPY WITH RETROGRADE PYELOGRAM/URETERAL STENT PLACEMENT;  Surgeon: Steven Frock,  MD;  [facility];  Service: Urology;  Laterality: Left;  . CYSTOSCOPY W/ URETERAL STENT PLACEMENT Left 04/10/2018   Procedure: CYSTOSCOPY WITH LEFT RETROGRADE URETERAL STENT EXCHANGE;  Surgeon: Steven Frock, MD;  Location: [facility];  Service: Urology;  Laterality: Left;  . CYSTOSCOPY W/ URETERAL STENT PLACEMENT Left 01/31/2019   Procedure: CYSTOSCOPY WITH RETROGRADE PYELOGRAM/URETERAL STENT PLACEMENT;  Surgeon: Steven Frock, MD;  [facility];  Service: Urology;  Laterality: Left;  45 MINS  .  CYSTOSCOPY W/ URETERAL STENT PLACEMENT Left 01/16/2020   Procedure: CYSTOSCOPY WITH RETROGRADE PYELOGRAM/URETERAL STENT EXCHANGED;  Surgeon: Steven Frock, MD;  Location: [facility];  Service: Urology;  Laterality: Left;  . CYSTOSCOPY WITH RETROGRADE PYELOGRAM, URETEROSCOPY AND STENT PLACEMENT Left 10/21/2014   Procedure: CYSTOSCOPY WITH RETROGRADE PYELOGRAM, URETEROSCOPY,BIOPSY AND STENT CHANGE;  Surgeon: Steven Frock, MD;  [facility];  Service: Urology;  Laterality: Left;  . CYSTOSCOPY/RETROGRADE/URETEROSCOPY Left 06/03/2014   Procedure: CYSTOSCOPY/RETROGRADE/URETEROSCOPY WITH BIOPSY,STENT EXCHANGE;  Surgeon: Steven Frock, MD;  Location[facility];  Service: Urology;  Laterality: Left;  . ESOPHAGOGASTRODUODENOSCOPY N/A 09/28/2014   Procedure: ESOPHAGOGASTRODUODENOSCOPY (EGD);  Surgeon: Steven Beams, MD;  Location: [facility];  Service: Endoscopy;  Laterality: N/A;  . GASTRIC ROUX-EN-Y     '74  . HERNIA REPAIR     bilateral hernia repairs   . TONSILLECTOMY  as child  . TOTAL HIP ARTHROPLASTY Left 05/19/2016   Procedure: LEFT TOTAL HIP ARTHROPLASTY ANTERIOR APPROACH;  Surgeon: Steven Rossetti, MD;  Location: [facility];  Service: Orthopedics;  Laterality: Left;  . TRANSURETHRAL RESECTION OF PROSTATE    . VAGOTOMY  1950's    No family history on file. Social History:  reports that he has quit smoking. His smoking use included cigarettes. He has quit using smokeless tobacco.  His smokeless tobacco use included chew. He reports previous alcohol use. He reports that he does not use drugs.  Allergies: No Known Allergies  (Not in a hospital admission)   Results for orders placed or performed during the hospital encounter of 02/15/20 (from the past 48 hour(s))  Lipase, blood     Status: None   Collection Time: 02/15/20  9:16 AM  Result Value Ref Range   Lipase 20 11 - 51 U/L    Comment: Performed at [facility], [place] [address] [place],  [place] [address]  Comprehensive metabolic panel     Status: Abnormal   Collection Time: 02/15/20  9:16 AM  Result Value Ref Range   Sodium 131 (L) 135 - 145 mmol/L   Potassium 4.3 3.5 - 5.1 mmol/L   Chloride 98 98 - 111 mmol/L   CO2 22 22 - 32 mmol/L   Glucose, Bld 155 (H) 70 - 99 mg/dL    Comment: Glucose reference range applies only to samples taken after fasting for at least 8 hours.   BUN 25 (H) 8 - 23 mg/dL   Creatinine, Ser 1.78 (H) 0.61 - 1.24 mg/dL   Calcium 9.2 8.9 - 10.3 mg/dL   Total Protein 7.6 6.5 - 8.1 g/dL   Albumin 3.7 3.5 - 5.0 g/dL   AST 17 15 - 41 U/L   ALT 11 0 - 44 U/L   Alkaline Phosphatase 68 38 - 126 U/L   Total Bilirubin 0.9 0.3 - 1.2 mg/dL   GFR calc non Af Amer 34 (L) >60 mL/min   GFR calc Af Amer 39 (L) >60 mL/min   Anion gap 11 5 - 15  Comment: Performed at [facility], [place] [address] [place], [place] [address]  CBC     Status: Abnormal   Collection Time: 02/15/20  9:16 AM  Result Value Ref Range   WBC 17.1 (H) 4.0 - 10.5 K/uL   RBC 4.34 4.22 - 5.81 MIL/uL   Hemoglobin 12.4 (L) 13.0 - 17.0 g/dL   HCT 37.7 (L) 39 - 52 %   MCV 86.9 80.0 - 100.0 fL   MCH 28.6 26.0 - 34.0 pg   MCHC 32.9 30.0 - 36.0 g/dL   RDW 14.8 11.5 - 15.5 %   Platelets 218 150 - 400 K/uL   nRBC 0.0 0.0 - 0.2 %    Comment: Performed at [facility], [place] [address], [place], [place] [address]  Lactic acid, plasma     Status: None   Collection Time: 02/15/20  9:16 AM  Result Value Ref Range   Lactic Acid, Venous 1.3 0.5 - 1.9 mmol/L    Comment: Performed at [facility], [place] [address], [place], [place] [address]   CT Abdomen Pelvis W Contrast  Result Date: 02/15/2020 CLINICAL DATA:  Abdominal distension 1 day duration with onset of RIGHT lower quadrant pain EXAM: CT ABDOMEN AND PELVIS WITH CONTRAST TECHNIQUE: Multidetector CT imaging of the abdomen and pelvis was performed using the standard protocol following bolus  administration of intravenous contrast. CONTRAST:  22mL OMNIPAQUE IOHEXOL 300 MG/ML  SOLN COMPARISON:  05/06/2014 FINDINGS: Lower chest: Signs of interstitial lung disease at the lung bases with similar distribution, slightly worse than on the study from 2015. Not associated with consolidation or pleural effusion. Hepatobiliary: Liver and biliary tree are normal. Portal vein is patent. Pancreas: Pancreas is normal without ductal dilation or inflammation. Spleen: Spleen normal size and contour. Adrenals/Urinary Tract: Adrenal glands are normal. Renal parenchymal loss on the LEFT since previous imaging with persistent but mild distension of LEFT intrarenal collecting systems and moderate distension of the LEFT renal pelvis grossly similar to studies from 2019. Proximal loop in the interpolar calices. Distal loop in the urinary bladder. Thickening along the course of the LEFT ureter. RIGHT kidney mildly atrophic and scarred with similar appearance to prior imaging. Stomach/Bowel: Signs of gastrojejunostomy similar to previous imaging. No sign of acute small bowel process. Dilated appendix with periappendiceal stranding. Appendicoliths at the appendiceal base measures approximately 1 x 6 mm. Appendix dilated to approximately 15 mm previously normal caliber. Mesoappendix seal stranding. No abscess. No pelvic fluid. Vascular/Lymphatic: SMV is patent.  Portal vein is patent. Calcific and noncalcific atheromatous plaque of the abdominal aorta. Nonaneurysmal caliber. Mildly dilated iliac vasculature with marked narrowing of the RIGHT common iliac artery. Also extensive soft plaque in the LEFT femoral artery. Max caliber of the common iliac arteries in the range of 1 point 8 and 1.7 cm RIGHT and LEFT respectively. No adenopathy in the retroperitoneum or in the upper abdomen. No pelvic lymphadenopathy. No free air.  No abscess. Reproductive: Limited assessment of the prostate which is heterogeneous and likely post TURP. Streak  artifact from LEFT hip arthroplasty limiting pelvic assessment. Other: No abscess. No free air. No abdominal wall hernia aside from a small umbilical hernia containing fat. Musculoskeletal: New sclerotic changes in RIGHT anterior sixth rib (image 3, series 2) Multifocal areas of sclerosis in the bilateral iliac bones. (Image 55, series 2) 13-14 mm sclerotic area in the posterior LEFT iliac bone. At least 5 small foci of sclerosis in the RIGHT iliac. New area of  sclerosis in the T11 vertebral body. Multilevel degenerative changes of the spine. No acute bone finding. IMPRESSION: 1. Findings of acute uncomplicated appendicitis with appendicoliths at the appendiceal base. 2. New sclerotic changes in the RIGHT anterior sixth rib and T11 vertebral body, concerning for metastatic disease. Heterogeneous prostate appears to have changes of TURP. Correlate with PSA and or history of prostate cancer. As it would commonly have this appearance in the setting of metastatic disease. 3. Renal parenchymal loss on the LEFT with persistent but mild distension of LEFT intrarenal collecting systems and moderate distension of the LEFT renal pelvis grossly similar to studies from 2019. Correlate with any new LEFT-sided flank pain or symptoms related to the urinary tract. 4. Signs of gastrojejunostomy similar to previous imaging. 5. Signs of interstitial lung disease at the lung bases with similar distribution, slightly worse than on the study from 2015. 6. Aortic atherosclerosis. These results were called by telephone at the time of interpretation on 02/15/2020 at 10:43 am to provider Lacretia Leigh , who verbally acknowledged these results. Aortic Atherosclerosis (ICD10-I70.0). Electronically Signed   By: Zetta Bills M.D.   On: 02/15/2020 10:43    Review of Systems  Constitutional: Negative.   HENT: Negative.   Eyes: Negative.   Respiratory: Positive for cough.   Cardiovascular: Negative.   Gastrointestinal: Positive for  abdominal pain. Negative for nausea.  Endocrine: Negative.   Genitourinary: Negative.   Musculoskeletal: Negative.   Skin: Negative.   Allergic/Immunologic: Negative.   Neurological: Negative.   Hematological: Negative.   Psychiatric/Behavioral: Negative.     Blood pressure 116/65, pulse 75, temperature 98.5 F (36.9 C), temperature source Oral, resp. rate 16, height 5\' 5"  (1.651 m), weight 86.2 kg, SpO2 95 %. Physical Exam Constitutional:      General: He is not in acute distress.    Appearance: He is well-developed. He is not ill-appearing.  HENT:     Head: Normocephalic and atraumatic.     Comments: Ears and nose normal Eyes:     General: No scleral icterus.    Extraocular Movements: Extraocular movements intact.     Pupils: Pupils are equal, round, and reactive to light.  Cardiovascular:     Rate and Rhythm: Normal rate and regular rhythm.     Heart sounds: Normal heart sounds.     Comments: No pitting edema of lower extremities Pulmonary:     Effort: Pulmonary effort is normal. No respiratory distress.     Breath sounds: Normal breath sounds.  Abdominal:     General: Abdomen is flat. A surgical scar is present. Bowel sounds are normal.     Palpations: Abdomen is soft.     Tenderness: There is abdominal tenderness in the right lower quadrant.     Hernia: No hernia is present.  Lymphadenopathy:     Comments: No groin or cervical lymphadenopathy  Skin:    General: Skin is warm and dry.     Coloration: Skin is not cyanotic or jaundiced.     Findings: No rash.  Neurological:     General: No focal deficit present.     Mental Status: He is alert and oriented to person, place, and time.  Psychiatric:        Mood and Affect: Mood normal.        Behavior: Behavior normal.      Assessment/Plan The patient appears to have acute appendicitis. Because of the risk of perforation and sepsis I think he would benefit from having the appendix  removed. He would also like to have  this done. I have discussed with him in detail the risks and benefits of the operation as well as some of the technical aspects and he understands and wishes to proceed  Autumn Messing III, MD 02/15/2020, 12:00 PM

## 2020-02-15 NOTE — ED Notes (Signed)
Pt has not provide urine sample at this time.

## 2020-02-15 NOTE — Anesthesia Postprocedure Evaluation (Signed)
Anesthesia Post Note  Patient: Steven Cain  Procedure(s) Performed: APPENDECTOMY LAPAROSCOPIC (N/A Abdomen)     Patient location during evaluation: PACU Anesthesia Type: General Level of consciousness: awake and alert Pain management: pain level controlled Vital Signs Assessment: post-procedure vital signs reviewed and stable Respiratory status: spontaneous breathing, nonlabored ventilation, respiratory function stable and patient connected to nasal cannula oxygen Cardiovascular status: blood pressure returned to baseline and stable Postop Assessment: no apparent nausea or vomiting Anesthetic complications: no   No complications documented.  Last Vitals:  Vitals:   02/15/20 1515 02/15/20 1536  BP: (!) 94/48 (!) 106/56  Pulse: 69 73  Resp: 15 18  Temp: 37 C 36.5 C  SpO2: 96% 97%    Last Pain:  Vitals:   02/15/20 1536  TempSrc: Oral  PainSc:                  Sayed Apostol L Kam Rahimi

## 2020-02-15 NOTE — Anesthesia Preprocedure Evaluation (Addendum)
Anesthesia Evaluation  Patient identified by MRN, date of birth, ID band Patient awake    Reviewed: Allergy & Precautions, NPO status , Patient's Chart, lab work & pertinent test results  Airway Mallampati: II  TM Distance: >3 FB Neck ROM: Full    Dental  (+) Edentulous Upper, Edentulous Lower   Pulmonary shortness of breath and with exertion, former smoker,  COVID positive 08/2019, now negative   Pulmonary exam normal breath sounds clear to auscultation       Cardiovascular hypertension, Pt. on medications negative cardio ROS Normal cardiovascular exam Rhythm:Regular Rate:Normal  TTE 2018 EF 55-60%, G1DD, mild AI, mild TR   Neuro/Psych PSYCHIATRIC DISORDERS Anxiety Depression negative neurological ROS     GI/Hepatic Neg liver ROS, PUD, GERD  ,  Endo/Other  negative endocrine ROS  Renal/GU Renal InsufficiencyRenal disease (Cr 1.78, K 4.3)  negative genitourinary   Musculoskeletal  (+) Arthritis ,   Abdominal   Peds  Hematology negative hematology ROS (+)   Anesthesia Other Findings   Reproductive/Obstetrics                            Anesthesia Physical Anesthesia Plan  ASA: III  Anesthesia Plan: General   Post-op Pain Management:    Induction: Intravenous and Rapid sequence  PONV Risk Score and Plan: 2 and Dexamethasone, Ondansetron and Treatment may vary due to age or medical condition  Airway Management Planned: Oral ETT  Additional Equipment:   Intra-op Plan:   Post-operative Plan: Extubation in OR  Informed Consent: I have reviewed the patients History and Physical, chart, labs and discussed the procedure including the risks, benefits and alternatives for the proposed anesthesia with the patient or authorized representative who has indicated his/her understanding and acceptance.     Dental advisory given  Plan Discussed with: CRNA  Anesthesia Plan Comments:         Anesthesia Quick Evaluation

## 2020-02-15 NOTE — Transfer of Care (Signed)
Immediate Anesthesia Transfer of Care Note  Patient: Steven Cain  Procedure(s) Performed: Procedure(s): APPENDECTOMY LAPAROSCOPIC (N/A)  Patient Location: PACU  Anesthesia Type:General  Level of Consciousness: Alert, Awake, Oriented  Airway & Oxygen Therapy: Patient Spontanous Breathing  Post-op Assessment: Report given to RN  Post vital signs: Reviewed and stable  Last Vitals:  Vitals:   02/15/20 1106 02/15/20 1153  BP: (!) 119/59 116/65  Pulse: 75 75  Resp: 16 16  Temp:    SpO2: 40% 99%    Complications: No apparent anesthesia complications

## 2020-02-15 NOTE — Op Note (Signed)
02/15/2020  1:58 PM  PATIENT:  Steven Cain  86 y.o. male  PRE-OPERATIVE DIAGNOSIS:  acute appendicitis  POST-OPERATIVE DIAGNOSIS:  acute appendicitis  PROCEDURE:  Procedure(s): APPENDECTOMY LAPAROSCOPIC (N/A)  SURGEON:  Surgeon(s) and Role:    * Jovita Kussmaul, MD - Primary  PHYSICIAN ASSISTANT:   ASSISTANTS: none   ANESTHESIA:   local and general  EBL:  minimal   BLOOD ADMINISTERED:none  DRAINS: none   LOCAL MEDICATIONS USED:  MARCAINE     SPECIMEN:  Source of Specimen:  appendix  DISPOSITION OF SPECIMEN:  PATHOLOGY  COUNTS:  YES  TOURNIQUET:  * No tourniquets in log *  DICTATION: .Dragon Dictation   After informed consent was obtained patient was brought to the operating room placed in the supine position on the operating room table. After adequate induction of general anesthesia the patient's abdomen was prepped with ChloraPrep, allowed to dry, and draped in usual sterile manner. The area below the umbilicus was infiltrated with quarter percent Marcaine. A small incision was made with a 15 blade knife. This incision was carried down through the subcutaneous tissue bluntly with a hemostat and Army-Navy retractors until the linea alba was identified. The linea alba was incised with a 15 blade knife. Each side was grasped Coker clamps and elevated anteriorly. The preperitoneal space was probed bluntly with a hemostat until the peritoneum was opened and access was gained to the abdominal cavity. A 0 Vicryl purse string stitch was placed in the fascia surrounding the opening. A Hassan cannula was placed through the opening and anchored in place with the previously placed Vicryl purse string stitch. The laparoscope was placed through the [facility] cannula. The abdomen was insufflated with carbon dioxide without difficulty. Next the suprapubic area was infiltrated with quarter percent Marcaine. A small incision was made with a 15 blade knife. A 5 mm port was placed bluntly through  this incision into the abdominal cavity. A site was then chosen between the 2 port for placement of a 5 mm port. The area was infiltrated with quarter percent Marcaine. A small stab incision was made with a 15 blade knife. A 5 mm port was placed bluntly through this incision and the abdominal cavity under direct vision. The laparoscope was then moved to the suprapubic port. Using a Glassman grasper and harmonic scalpel the right lower quadrant was inspected. The appendix was readily identified. The appendix was elevated anteriorly and the mesoappendix was taken down sharply with the harmonic scalpel. Once the base of the appendix where it joined the cecum was identified and cleared of any tissue then a laparoscopic GIA green load 6 row stapler was placed through the [facility] cannula. The stapler was placed across the base of the appendix clamped and fired thereby dividing the base of the appendix between staple lines. A laparoscopic bag was then inserted through the [facility] cannula. The appendix was placed within the bag and the bag was sealed. The abdomen was then irrigated with copious amounts of saline until the effluent was clear. No other abnormalities were noted. There was a small amount of bleeding from the staple line which was controlled with clips. The appendix and bag were removed with the [facility] cannula through the infraumbilical port without difficulty. The fascial defect was closed with the previously placed Vicryl pursestring stitch as well as with another interrupted 0 Vicryl figure-of-eight stitch. The rest of the ports were removed under direct vision and were found to be hemostatic. The gas was allowed to  escape. The skin incisions were closed with interrupted 4-0 Monocryl subcuticular stitches. Dermabond dressings were applied. The patient tolerated the procedure well. At the end of the case all needle sponge and instrument counts were correct. The patient was then awakened and taken to recovery in  stable condition.  PLAN OF CARE: Admit for overnight observation  PATIENT DISPOSITION:  PACU - hemodynamically stable.   Delay start of Pharmacological VTE agent (>24hrs) due to surgical blood loss or risk of bleeding: no

## 2020-02-15 NOTE — ED Notes (Signed)
PACU called to bring pt over. RN in PACU declined report. Pt transferred by this RN and tech to PACU via stretcher. Family in [place] with belongings.

## 2020-02-15 NOTE — ED Provider Notes (Signed)
[facility] DEPT Provider Note   CSN: [id number] Arrival date & time: 02/15/20  2409     History Chief Complaint  Patient presents with   Abdominal Pain    Steven Cain is a 86 y.o. male.  86 year old male presents with abdominal pain that began in his periumbilical region and not radiate to the right lower quadrant.  Pain is been persistent in nature and associated with low-grade temperature.  He has had nausea and some emesis.  Denies any urinary symptoms.  Pain worse with any movement.  No prior history of same pain no treatment use prior to arrival        Past Medical History:  Diagnosis Date   Abnormal EKG    hx of left anterior fasicular block on ekg, no cardiologist   Anxiety    AR (aortic regurgitation) 03/23/2017   Mild, Noted on ECHO   Arthritis    DJD   Benign enlargement of prostate    frequent UTI, trim of prostate done , with some issues of incontinence now.   Chronic bronchitis (HCC)    chronic- usually has phelgm often   COVID-19 08/2019   fatigue and loss of taste and smell x 10 days did monoclonal antibody therapy   Depression    Dyspnea    with excertion   Fluid retention in legs    resolved   GERD (gastroesophageal reflux disease)    rare   GI bleed    Grade I diastolic dysfunction    Hard of hearing    hear more on left side- no hearing aid   History of colon polyps    History of transfusion 2016   Hydronephrosis 2018   Left ckd stage 3 no nephrologist per wife adele   Hypertension    Moderate concentric left ventricular hypertrophy 03/23/2017   Noted on ECHO   Perforated, severe stomach ulcer ([place]) 1974   Peripheral neuropathy    feet and toes   Spinal stenosis    TR (tricuspid regurgitation) 03/23/2017   Mild, Noted on ECHO   Transfusion history    none recent   Ureteral stricture    CHRONIC   Urinary incontinence    UTI (urinary tract infection)    frequent   Wears  glasses     Patient Active Problem List   Diagnosis Date Noted   Pain in left hip    Acute lower UTI 06/08/2018   Nausea & vomiting 06/08/2018   Syncope due to orthostatic hypotension 03/23/2017   Hyponatremia 03/22/2017   Essential hypertension 03/22/2017   Osteoarthritis of left hip 05/19/2016   History of total left hip replacement 05/19/2016   CKD (chronic kidney disease) stage 3, GFR 30-59 ml/min 09/27/2014   Anemia associated with acute blood loss 09/27/2014   Orthostasis 09/27/2014   GI bleed 09/27/2014   Acute pyelonephritis 05/08/2014    Class: Acute   Hydronephrosis of left kidney 05/08/2014    Class: Acute   Ureteral mass 05/08/2014    Class: Acute   UTI (urinary tract infection) 04/24/2013   Chills 04/24/2013    Past Surgical History:  Procedure Laterality Date   ABDOMINAL SURGERY     bilroths tube '74   COLONOSCOPY N/A 09/29/2014   Procedure: COLONOSCOPY;  Surgeon: Beryle Beams, MD;  Location: [facility];  Service: Endoscopy;  Laterality: N/A;   CYSTOSCOPY W/ URETERAL STENT PLACEMENT Left 05/06/2014   Procedure: CYSTOSCOPY WITH RETROGRADE PYELOGRAM/URETERAL STENT PLACEMENT;  Surgeon: [name],  MD;  Location: [facility];  Service: Urology;  Laterality: Left;   CYSTOSCOPY W/ URETERAL STENT PLACEMENT Left 03/31/2015   Procedure: CYSTOSCOPY WITH LEFT RETROGRADE PYELOGRAM/URETERAL STENT EXCHANGE;  Surgeon: [name], MD;  Location: [facility];  Service: Urology;  Laterality: Left;   CYSTOSCOPY W/ URETERAL STENT PLACEMENT Left 09/15/2015   Procedure: CYSTOSCOPY WITH LEFT RETROGRADE PYELOGRAM/URETERAL STENT EXCHANGE ;  Surgeon: [name], MD;  Location: [facility];  Service: Urology;  Laterality: Left;   CYSTOSCOPY W/ URETERAL STENT PLACEMENT Left 04/28/2016   Procedure: CYSTOSCOPY WITH RETROGRADE PYELOGRAM/URETERAL STENT EXCHANGE;  Surgeon: [name], MD;  Location: [facility];  Service: Urology;  Laterality: Left;   CYSTOSCOPY W/ URETERAL  STENT PLACEMENT Left 11/08/2016   Procedure: CYSTOSCOPY WITH RETROGRADE PYELOGRAM/URETERAL STENT EXCHANGE;  Surgeon: [name], MD;  Location: [facility];  Service: Urology;  Laterality: Left;   CYSTOSCOPY W/ URETERAL STENT PLACEMENT Left 09/14/2017   Procedure: CYSTOSCOPY WITH RETROGRADE PYELOGRAM/URETERAL STENT PLACEMENT;  Surgeon: [name], MD;  Location: [facility];  Service: Urology;  Laterality: Left;   CYSTOSCOPY W/ URETERAL STENT PLACEMENT Left 04/10/2018   Procedure: CYSTOSCOPY WITH LEFT RETROGRADE URETERAL STENT EXCHANGE;  Surgeon: [name], MD;  Location: [facility];  Service: Urology;  Laterality: Left;   CYSTOSCOPY W/ URETERAL STENT PLACEMENT Left 01/31/2019   Procedure: CYSTOSCOPY WITH RETROGRADE PYELOGRAM/URETERAL STENT PLACEMENT;  Surgeon: [name], MD;  Location: [facility];  Service: Urology;  Laterality: Left;  [place] PLACEMENT Left 01/16/2020   Procedure: CYSTOSCOPY WITH RETROGRADE PYELOGRAM/URETERAL STENT EXCHANGED;  Surgeon: [name], MD;  Location: [facility];  Service: Urology;  Laterality: Left;   CYSTOSCOPY WITH RETROGRADE PYELOGRAM, URETEROSCOPY AND STENT PLACEMENT Left 10/21/2014   Procedure: CYSTOSCOPY WITH RETROGRADE PYELOGRAM, URETEROSCOPY,BIOPSY AND STENT CHANGE;  Surgeon: [name], MD;  Location: [facility];  Service: Urology;  Laterality: Left;   CYSTOSCOPY/RETROGRADE/URETEROSCOPY Left 06/03/2014   Procedure: CYSTOSCOPY/RETROGRADE/URETEROSCOPY WITH BIOPSY,STENT EXCHANGE;  Surgeon: [name], MD;  Location: [facility];  Service: Urology;  Laterality: Left;   ESOPHAGOGASTRODUODENOSCOPY N/A 09/28/2014   Procedure: ESOPHAGOGASTRODUODENOSCOPY (EGD);  Surgeon: Beryle Beams, MD;  Location: [facility];  Service: Endoscopy;  Laterality: N/A;   GASTRIC ROUX-EN-Y     '74   HERNIA REPAIR     bilateral hernia repairs    TONSILLECTOMY  as child   TOTAL HIP ARTHROPLASTY Left 05/19/2016    Procedure: LEFT TOTAL HIP ARTHROPLASTY ANTERIOR APPROACH;  Surgeon: Mcarthur Rossetti, MD;  Location: [facility];  Service: Orthopedics;  Laterality: Left;   TRANSURETHRAL RESECTION OF PROSTATE     VAGOTOMY  1950's       No family history on file.  Social History   Tobacco Use   Smoking status: Former Smoker    Types: Cigarettes   Smokeless tobacco: Former Systems developer    Types: Chew   Tobacco comment: quit yrs ago  Vaping Use   Vaping Use: Never used  Substance Use Topics   Alcohol use: Not Currently   Drug use: No    Home Medications Prior to Admission medications   Medication Sig Start Date End Date Taking? Authorizing Provider  albuterol (VENTOLIN HFA) 108 (90 Base) MCG/ACT inhaler Inhale 2 puffs into the lungs every 6 (six) hours as needed for wheezing or shortness of breath.    [provider]  amLODipine (NORVASC) 10 MG tablet Take 1 tablet (10 mg total) by mouth daily. 06/13/18   Danford, Suann Larry, MD  Ascorbic Acid (  VITAMIN C) 1000 MG tablet Take 2,000 mg by mouth daily.     [provider]  aspirin EC 81 MG tablet Take 81 mg by mouth daily.    [provider]  Cholecalciferol (VITAMIN D) 50 MCG (2000 UT) CAPS Take 4,000 Units by mouth daily.    [provider]  cyanocobalamin (,VITAMIN B-12,) 1000 MCG/ML injection Inject 1,000 mcg into the muscle every 30 (thirty) days.    [provider]  fish oil-omega-3 fatty acids 1000 MG capsule Take 1 g by mouth 2 (two) times a week.     [provider]  guaiFENesin (MUCINEX) 600 MG 12 hr tablet Take 1,200 mg by mouth 2 (two) times daily.     [provider]  lisinopril (ZESTRIL) 5 MG tablet Take 5 mg by mouth in the morning and at bedtime.     [provider]  meclizine (ANTIVERT) 25 MG tablet Take 25 mg by mouth 2 (two) times daily as needed for dizziness.    [provider]  Multiple Vitamin (MULTIVITAMIN WITH MINERALS) TABS tablet Take 1  tablet by mouth every morning.    [provider]  PARoxetine (PAXIL) 40 MG tablet Take 1 tablet (40 mg total) by mouth daily. Patient taking differently: Take 40 mg by mouth at bedtime.  06/13/18   Danford, Suann Larry, MD  psyllium (HYDROCIL/METAMUCIL) 95 % PACK Take 1 packet by mouth daily.     [provider]  senna-docusate (SENOKOT-S) 8.6-50 MG tablet Take 1 tablet by mouth at bedtime. Patient taking differently: Take 2 tablets by mouth every evening.  06/13/18   Danford, Suann Larry, MD  traMADol (ULTRAM) 50 MG tablet Take 1 tablet (50 mg total) by mouth every 6 (six) hours as needed for moderate pain or severe pain. Post-operatively. 01/16/20 01/15/21  [name], MD  vitamin E 100 UNIT capsule Take 100 Units by mouth every morning.     [provider]    Allergies    Patient has no known allergies.  Review of Systems   Review of Systems  All other systems reviewed and are negative.   Physical Exam Updated Vital Signs BP (!) 110/58 (BP Location: Right Arm)    Pulse 85    Temp 100.1 F (37.8 C) (Oral)    Resp 16    Ht 1.651 m (5\' 5" )    Wt 86.2 kg    SpO2 94%    BMI 31.62 kg/m   Physical Exam Vitals and nursing note reviewed.  Constitutional:      General: He is not in acute distress.    Appearance: Normal appearance. He is well-developed. He is not toxic-appearing.  HENT:     Head: Normocephalic and atraumatic.  Eyes:     General: Lids are normal.     Conjunctiva/sclera: Conjunctivae normal.     Pupils: Pupils are equal, round, and reactive to light.  Neck:     Thyroid: No thyroid mass.     Trachea: No tracheal deviation.  Cardiovascular:     Rate and Rhythm: Normal rate and regular rhythm.     Heart sounds: Normal heart sounds. No murmur heard.  No gallop.   Pulmonary:     Effort: Pulmonary effort is normal. No respiratory distress.     Breath sounds: Normal breath sounds. No stridor. No decreased breath sounds, wheezing, rhonchi or  rales.  Abdominal:     General: Bowel sounds are normal. There is no distension.  Palpations: Abdomen is soft.     Tenderness: There is abdominal tenderness in the right lower quadrant. There is guarding. There is no rebound.    Musculoskeletal:        General: No tenderness. Normal range of motion.     Cervical back: Normal range of motion and neck supple.  Skin:    General: Skin is warm and dry.     Findings: No abrasion or rash.  Neurological:     Mental Status: He is alert and oriented to person, place, and time.     GCS: GCS eye subscore is 4. GCS verbal subscore is 5. GCS motor subscore is 6.     Cranial Nerves: No cranial nerve deficit.     Sensory: No sensory deficit.  Psychiatric:        Speech: Speech normal.        Behavior: Behavior normal.     ED Results / Procedures / Treatments   Labs (all labs ordered are listed, but only abnormal results are displayed) Labs Reviewed  LIPASE, BLOOD  COMPREHENSIVE METABOLIC PANEL  CBC  URINALYSIS, ROUTINE W REFLEX MICROSCOPIC  LACTIC ACID, PLASMA    EKG None  Radiology No results found.  Procedures Procedures (including critical care time)  Medications Ordered in ED Medications  0.9 %  sodium chloride infusion (10 mL/hr Intravenous New Bag/Given 02/15/20 0912)  sodium chloride flush (NS) 0.9 % injection 3 mL (3 mLs Intravenous Given 02/15/20 0910)    ED Course  I have reviewed the triage vital signs and the nursing notes.  Pertinent labs & imaging results that were available during my care of the patient were reviewed by me and considered in my medical decision making (see chart for details).    MDM Rules/Calculators/A&P                          Patient's abdominal CT consistent with appendicitis.  Start IV antibiotics.  Discussed with Dr. Marlou Starks from general surgery will take patient to the OR Final Clinical Impression(s) / ED Diagnoses Final diagnoses:  None    Rx / DC Orders ED Discharge Orders    None        Lacretia Leigh, MD 02/17/20 2014

## 2020-02-15 NOTE — ED Triage Notes (Signed)
Yesterday afternoon onset of LRQ pain, fever, feeling "sick" trying to vomit in triage

## 2020-02-15 NOTE — Anesthesia Procedure Notes (Signed)
Procedure Name: Intubation Date/Time: 02/15/2020 1:02 PM Performed by: Gerald Leitz, CRNA Pre-anesthesia Checklist: Patient identified, Patient being monitored, Timeout performed, Emergency Drugs available and Suction available Patient Re-evaluated:Patient Re-evaluated prior to induction Oxygen Delivery Method: Circle system utilized Preoxygenation: Pre-oxygenation with 100% oxygen Induction Type: IV induction Ventilation: Mask ventilation without difficulty Laryngoscope Size: Mac and 3 Grade View: Grade I Tube type: Oral Tube size: 7.5 mm Number of attempts: 1 Placement Confirmation: ETT inserted through vocal cords under direct vision,  positive ETCO2 and breath sounds checked- equal and bilateral Secured at: 21 cm Tube secured with: Tape Dental Injury: Teeth and Oropharynx as per pre-operative assessment

## 2020-02-16 ENCOUNTER — Encounter (HOSPITAL_COMMUNITY): Payer: Self-pay | Admitting: General Surgery

## 2020-02-16 DIAGNOSIS — Z86010 Personal history of colonic polyps: Secondary | ICD-10-CM | POA: Diagnosis not present

## 2020-02-16 DIAGNOSIS — E871 Hypo-osmolality and hyponatremia: Secondary | ICD-10-CM | POA: Diagnosis not present

## 2020-02-16 DIAGNOSIS — I351 Nonrheumatic aortic (valve) insufficiency: Secondary | ICD-10-CM | POA: Diagnosis present

## 2020-02-16 DIAGNOSIS — Z20822 Contact with and (suspected) exposure to covid-19: Secondary | ICD-10-CM | POA: Diagnosis present

## 2020-02-16 DIAGNOSIS — N179 Acute kidney failure, unspecified: Secondary | ICD-10-CM | POA: Diagnosis present

## 2020-02-16 DIAGNOSIS — F419 Anxiety disorder, unspecified: Secondary | ICD-10-CM | POA: Diagnosis present

## 2020-02-16 DIAGNOSIS — Z87891 Personal history of nicotine dependence: Secondary | ICD-10-CM | POA: Diagnosis not present

## 2020-02-16 DIAGNOSIS — H9190 Unspecified hearing loss, unspecified ear: Secondary | ICD-10-CM | POA: Diagnosis present

## 2020-02-16 DIAGNOSIS — Z87440 Personal history of urinary (tract) infections: Secondary | ICD-10-CM | POA: Diagnosis not present

## 2020-02-16 DIAGNOSIS — I129 Hypertensive chronic kidney disease with stage 1 through stage 4 chronic kidney disease, or unspecified chronic kidney disease: Secondary | ICD-10-CM | POA: Diagnosis present

## 2020-02-16 DIAGNOSIS — Z7982 Long term (current) use of aspirin: Secondary | ICD-10-CM | POA: Diagnosis not present

## 2020-02-16 DIAGNOSIS — F329 Major depressive disorder, single episode, unspecified: Secondary | ICD-10-CM | POA: Diagnosis present

## 2020-02-16 DIAGNOSIS — Z8711 Personal history of peptic ulcer disease: Secondary | ICD-10-CM | POA: Diagnosis not present

## 2020-02-16 DIAGNOSIS — M1990 Unspecified osteoarthritis, unspecified site: Secondary | ICD-10-CM | POA: Diagnosis present

## 2020-02-16 DIAGNOSIS — J42 Unspecified chronic bronchitis: Secondary | ICD-10-CM | POA: Diagnosis present

## 2020-02-16 DIAGNOSIS — N39498 Other specified urinary incontinence: Secondary | ICD-10-CM | POA: Diagnosis present

## 2020-02-16 DIAGNOSIS — N401 Enlarged prostate with lower urinary tract symptoms: Secondary | ICD-10-CM | POA: Diagnosis present

## 2020-02-16 DIAGNOSIS — K219 Gastro-esophageal reflux disease without esophagitis: Secondary | ICD-10-CM | POA: Diagnosis present

## 2020-02-16 DIAGNOSIS — Z9884 Bariatric surgery status: Secondary | ICD-10-CM | POA: Diagnosis not present

## 2020-02-16 DIAGNOSIS — N1832 Chronic kidney disease, stage 3b: Secondary | ICD-10-CM | POA: Diagnosis present

## 2020-02-16 DIAGNOSIS — G629 Polyneuropathy, unspecified: Secondary | ICD-10-CM | POA: Diagnosis present

## 2020-02-16 DIAGNOSIS — I071 Rheumatic tricuspid insufficiency: Secondary | ICD-10-CM | POA: Diagnosis present

## 2020-02-16 DIAGNOSIS — N135 Crossing vessel and stricture of ureter without hydronephrosis: Secondary | ICD-10-CM | POA: Diagnosis present

## 2020-02-16 DIAGNOSIS — R1033 Periumbilical pain: Secondary | ICD-10-CM | POA: Diagnosis not present

## 2020-02-16 DIAGNOSIS — K3532 Acute appendicitis with perforation and localized peritonitis, without abscess: Secondary | ICD-10-CM | POA: Diagnosis present

## 2020-02-16 LAB — URINALYSIS, ROUTINE W REFLEX MICROSCOPIC
Bacteria, UA: NONE SEEN
Bilirubin Urine: NEGATIVE
Glucose, UA: NEGATIVE mg/dL
Ketones, ur: NEGATIVE mg/dL
Nitrite: NEGATIVE
Protein, ur: NEGATIVE mg/dL
Specific Gravity, Urine: 1.017 (ref 1.005–1.030)
pH: 6 (ref 5.0–8.0)

## 2020-02-16 LAB — CBC WITH DIFFERENTIAL/PLATELET
Abs Immature Granulocytes: 0.06 10*3/uL (ref 0.00–0.07)
Basophils Absolute: 0 10*3/uL (ref 0.0–0.1)
Basophils Relative: 0 %
Eosinophils Absolute: 0 10*3/uL (ref 0.0–0.5)
Eosinophils Relative: 0 %
HCT: 32.8 % — ABNORMAL LOW (ref 39.0–52.0)
Hemoglobin: 10.8 g/dL — ABNORMAL LOW (ref 13.0–17.0)
Immature Granulocytes: 0 %
Lymphocytes Relative: 11 %
Lymphs Abs: 1.5 10*3/uL (ref 0.7–4.0)
MCH: 29.1 pg (ref 26.0–34.0)
MCHC: 32.9 g/dL (ref 30.0–36.0)
MCV: 88.4 fL (ref 80.0–100.0)
Monocytes Absolute: 0.8 10*3/uL (ref 0.1–1.0)
Monocytes Relative: 6 %
Neutro Abs: 11.8 10*3/uL — ABNORMAL HIGH (ref 1.7–7.7)
Neutrophils Relative %: 83 %
Platelets: 169 10*3/uL (ref 150–400)
RBC: 3.71 MIL/uL — ABNORMAL LOW (ref 4.22–5.81)
RDW: 14.8 % (ref 11.5–15.5)
WBC: 14.2 10*3/uL — ABNORMAL HIGH (ref 4.0–10.5)
nRBC: 0 % (ref 0.0–0.2)

## 2020-02-16 LAB — BASIC METABOLIC PANEL
Anion gap: 10 (ref 5–15)
BUN: 27 mg/dL — ABNORMAL HIGH (ref 8–23)
CO2: 21 mmol/L — ABNORMAL LOW (ref 22–32)
Calcium: 8.5 mg/dL — ABNORMAL LOW (ref 8.9–10.3)
Chloride: 98 mmol/L (ref 98–111)
Creatinine, Ser: 2.01 mg/dL — ABNORMAL HIGH (ref 0.61–1.24)
GFR calc Af Amer: 34 mL/min — ABNORMAL LOW (ref >60)
GFR calc non Af Amer: 29 mL/min — ABNORMAL LOW (ref >60)
Glucose, Bld: 135 mg/dL — ABNORMAL HIGH (ref 70–99)
Potassium: 4.9 mmol/L (ref 3.5–5.1)
Sodium: 129 mmol/L — ABNORMAL LOW (ref 135–145)

## 2020-02-16 MED ORDER — HYDROXYZINE HCL 25 MG PO TABS
25.0000 mg | ORAL_TABLET | Freq: Three times a day (TID) | ORAL | Status: DC | PRN
  Administered 2020-02-16: 25 mg via ORAL
  Filled 2020-02-16: qty 1

## 2020-02-16 MED ORDER — DEXTROSE-NACL 5-0.9 % IV SOLN: INTRAVENOUS | Status: DC

## 2020-02-16 NOTE — Progress Notes (Signed)
   02/16/20 2300  Provider Notification  Provider Name/Title [facility]  Date Provider Notified 02/16/20  Time Provider Notified 2138  Notification Type Page  Notification Reason Requested by patient/family (medication for itching. )  Response See new orders (vistaril ordered)  Date of Provider Response 02/16/20  Time of Provider Response 2207   Notified on call provider patient thought he was having an allergic reaction to the flagyl. He does not have a rash or hives but redness on his arm where he is scratching. Patient requested vistaril for this. Medication was ordered and patient given the medication. Patient is currently resting.

## 2020-02-16 NOTE — Evaluation (Signed)
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 He began having abdominal pain 02/14/20. The pain had been fairly constant and did not improve overnight. He experienced some fever. He denies any nausea or vomiting. He came to the emergency department where a CT scan was suggestive of acute appendicitis. Patient is no s/p laparoscopic appendectomy on 02/15/20. PMH significant for HTN, GERD, OA, HOH, COVID-19, Lt THA in 2017.    Clinical Impression  Steven Cain is 86 y.o. male admitted with above HPI and diagnosis. Patient is currently limited by functional impairments below (see PT problem list). Patient lives with his wife and requires assistance for ADL's and ambulates with RW at baseline. Patient was able to tolerate ~ 80' gait today with RW to steady balance. Min assist provided to prevent LOB. Pt was able to perform lower body dressing with min assist to steady. Patient will benefit from continued skilled PT interventions to address impairments and progress independence with mobility, recommending return home with HHPT and 24/7 assist from family initially. Acute PT will follow and progress as able.     Follow Up Recommendations Home health PT;Supervision/Assistance - 24 hour    Equipment Recommendations  None recommended by PT    Recommendations for Other Services       Precautions / Restrictions Precautions Precautions: Fall Restrictions Weight Bearing Restrictions: No      Mobility  Bed Mobility Overal bed mobility: Needs Assistance Bed Mobility: Supine to Sit     Supine to sit: Min assist;HOB elevated     General bed mobility comments: cues for sequencing use of bed rail and assist to bring LE's off EOB and raise trunk.   Transfers Overall transfer level: Needs assistance Equipment used: Rolling walker  (2 wheeled) Transfers: Sit to/from Stand Sit to Stand: Min assist         General transfer comment: cues required for technique with RW. Light assist for power up and to steady in standing.  Ambulation/Gait Ambulation/Gait assistance: Min assist Gait Distance (Feet): 80 Feet Assistive device: Rolling walker (2 wheeled) Gait Pattern/deviations: Step-through pattern;Decreased step length - right;Decreased step length - left;Decreased stride length;Trunk flexed Gait velocity: decreased   General Gait Details: cues for safe step pattern and proximty to RW. Patient slightly unsteady and chair follow for safety. Patient HR ~80 and SpO2 at 93% during gait on RA.  Stairs            Wheelchair Mobility    Modified Rankin (Stroke Patients Only)       Balance Overall balance assessment: Needs assistance Sitting-balance support: Feet supported Sitting balance-Leahy Scale: Fair     Standing balance support: During functional activity;Bilateral upper extremity supported Standing balance-Leahy Scale: Fair Standing balance comment: pt stood EOB to doff breif and don fresh/clean brief in standing. Intermittent UE support on bed to steady self.                Pertinent Vitals/Pain Pain Assessment: Faces Faces Pain Scale: Hurts little more Pain Location: bil shoulders Pain Descriptors / Indicators: Discomfort;Grimacing Pain Intervention(s): Limited activity within patient's tolerance;Monitored during session;Repositioned    Home Living Family/patient expects to be discharged to:: Private residence Living Arrangements: Spouse/significant other Available Help at Discharge: Family Type of Home: House Home Access: Ramped entrance     Home Layout: One level Home Equipment: Environmental consultant -  2 wheels;Shower seat;Grab bars - toilet;Grab bars - tub/shower Additional Comments: pt reports his wife is much younger and works as an Therapist, sports and cares for him at home.     Prior Function Level of  Independence: Needs assistance   Gait / Transfers Assistance Needed: pt uses walls/furniture to move around room and walks with RW to go down hallway with wife and walk out to deck to sit outside. He does not walk often however.  ADL's / Homemaking Assistance Needed: pt's wife assists with dressing and bathing, pt has a walk in shower and sits on shower seat to bath.   Comments: pt is reired MD, enjoys crosswords puzzles and is very pleasant.     Hand Dominance        Extremity/Trunk Assessment   Upper Extremity Assessment Upper Extremity Assessment: Defer to OT evaluation;Generalized weakness    Lower Extremity Assessment Lower Extremity Assessment: Generalized weakness    Cervical / Trunk Assessment Cervical / Trunk Assessment: Kyphotic  Communication   Communication: HOH  Cognition Arousal/Alertness: Awake/alert Behavior During Therapy: WFL for tasks assessed/performed Overall Cognitive Status: Within Functional Limits for tasks assessed             General Comments      Exercises     Assessment/Plan    PT Assessment Patient needs continued PT services  PT Problem List Decreased strength;Decreased range of motion;Decreased activity tolerance;Decreased balance;Decreased mobility;Decreased knowledge of use of DME       PT Treatment Interventions DME instruction;Gait training;Stair training;Functional mobility training;Therapeutic activities;Therapeutic exercise;Balance training;Patient/family education    PT Goals (Current goals can be found in the Care Plan section)  Acute Rehab PT Goals Patient Stated Goal: go back home PT Goal Formulation: With patient Time For Goal Achievement: 03/01/20 Potential to Achieve Goals: Good    Frequency Min 3X/week    AM-PAC PT "6 Clicks" Mobility  Outcome Measure Help needed turning from your back to your side while in a flat bed without using bedrails?: A Little Help needed moving from lying on your back to sitting on  the side of a flat bed without using bedrails?: A Little Help needed moving to and from a bed to a chair (including a wheelchair)?: A Little Help needed standing up from a chair using your arms (e.g., wheelchair or bedside chair)?: A Little Help needed to walk in hospital room?: A Little Help needed climbing 3-5 steps with a railing? : A Lot 6 Click Score: 17    End of Session Equipment Utilized During Treatment: Gait belt Activity Tolerance: Patient tolerated treatment well Patient left: in chair;with call bell/phone within reach;with chair alarm set;with nursing/sitter in room Nurse Communication: Mobility status PT Visit Diagnosis: Difficulty in walking, not elsewhere classified (R26.2);Muscle weakness (generalized) (M62.81)    Time: 4097-3532 PT Time Calculation (min) (ACUTE ONLY): 36 min   Charges:   PT Evaluation $PT Eval Moderate Complexity: 1 Mod PT Treatments $Gait Training: 8-22 mins        Verner Mould, DPT Acute Rehabilitation Services  Office [phone] Pager [phone]  02/16/2020 12:16 PM

## 2020-02-16 NOTE — Progress Notes (Signed)
1 Day Post-Op   Subjective/Chief Complaint: No complaints other than some mild soreness.   Objective: Vital signs in last 24 hours: Temp:  [97.7 F (36.5 C)-100.1 F (37.8 C)] 98.9 F (37.2 C) (07/05 0401) Pulse Rate:  [69-92] 70 (07/05 0401) Resp:  [11-24] 18 (07/05 0401) BP: (94-150)/(48-84) 129/64 (07/05 0401) SpO2:  [93 %-100 %] 97 % (07/05 0401) Weight:  [86.2 kg] 86.2 kg (07/04 0910)    Intake/Output from previous day: 07/04 0701 - 07/05 0700 In: 970 [P.O.:120; I.V.:850] Out: 520 [Urine:500; Blood:20] Intake/Output this shift: No intake/output data recorded.  General appearance: alert and cooperative Resp: clear to auscultation bilaterally Cardio: regular rate and rhythm GI: Soft, mild tenderness on the right.  Incisions okay  Lab Results:  Recent Labs    02/15/20 0916 02/16/20 0650  WBC 17.1* 14.2*  HGB 12.4* 10.8*  HCT 37.7* 32.8*  PLT 218 169   BMET Recent Labs    02/15/20 0916 02/16/20 0650  NA 131* 129*  K 4.3 4.9  CL 98 98  CO2 22 21*  GLUCOSE 155* 135*  BUN 25* 27*  CREATININE 1.78* 2.01*  CALCIUM 9.2 8.5*   PT/INR No results for input(s): LABPROT, INR in the last 72 hours. ABG No results for input(s): PHART, HCO3 in the last 72 hours.  Invalid input(s): PCO2, PO2  Studies/Results: CT Abdomen Pelvis W Contrast  Result Date: 02/15/2020 CLINICAL DATA:  Abdominal distension 1 day duration with onset of RIGHT lower quadrant pain EXAM: CT ABDOMEN AND PELVIS WITH CONTRAST TECHNIQUE: Multidetector CT imaging of the abdomen and pelvis was performed using the standard protocol following bolus administration of intravenous contrast. CONTRAST:  95mL OMNIPAQUE IOHEXOL 300 MG/ML  SOLN COMPARISON:  05/06/2014 FINDINGS: Lower chest: Signs of interstitial lung disease at the lung bases with similar distribution, slightly worse than on the study from 2015. Not associated with consolidation or pleural effusion. Hepatobiliary: Liver and biliary tree are  normal. Portal vein is patent. Pancreas: Pancreas is normal without ductal dilation or inflammation. Spleen: Spleen normal size and contour. Adrenals/Urinary Tract: Adrenal glands are normal. Renal parenchymal loss on the LEFT since previous imaging with persistent but mild distension of LEFT intrarenal collecting systems and moderate distension of the LEFT renal pelvis grossly similar to studies from 2019. Proximal loop in the interpolar calices. Distal loop in the urinary bladder. Thickening along the course of the LEFT ureter. RIGHT kidney mildly atrophic and scarred with similar appearance to prior imaging. Stomach/Bowel: Signs of gastrojejunostomy similar to previous imaging. No sign of acute small bowel process. Dilated appendix with periappendiceal stranding. Appendicoliths at the appendiceal base measures approximately 1 x 6 mm. Appendix dilated to approximately 15 mm previously normal caliber. Mesoappendix seal stranding. No abscess. No pelvic fluid. Vascular/Lymphatic: SMV is patent.  Portal vein is patent. Calcific and noncalcific atheromatous plaque of the abdominal aorta. Nonaneurysmal caliber. Mildly dilated iliac vasculature with marked narrowing of the RIGHT common iliac artery. Also extensive soft plaque in the LEFT femoral artery. Max caliber of the common iliac arteries in the range of 1 point 8 and 1.7 cm RIGHT and LEFT respectively. No adenopathy in the retroperitoneum or in the upper abdomen. No pelvic lymphadenopathy. No free air.  No abscess. Reproductive: Limited assessment of the prostate which is heterogeneous and likely post TURP. Streak artifact from LEFT hip arthroplasty limiting pelvic assessment. Other: No abscess. No free air. No abdominal wall hernia aside from a small umbilical hernia containing fat. Musculoskeletal: New sclerotic changes in RIGHT  anterior sixth rib (image 3, series 2) Multifocal areas of sclerosis in the bilateral iliac bones. (Image 55, series 2) 13-14 mm  sclerotic area in the posterior LEFT iliac bone. At least 5 small foci of sclerosis in the RIGHT iliac. New area of sclerosis in the T11 vertebral body. Multilevel degenerative changes of the spine. No acute bone finding. IMPRESSION: 1. Findings of acute uncomplicated appendicitis with appendicoliths at the appendiceal base. 2. New sclerotic changes in the RIGHT anterior sixth rib and T11 vertebral body, concerning for metastatic disease. Heterogeneous prostate appears to have changes of TURP. Correlate with PSA and or history of prostate cancer. As it would commonly have this appearance in the setting of metastatic disease. 3. Renal parenchymal loss on the LEFT with persistent but mild distension of LEFT intrarenal collecting systems and moderate distension of the LEFT renal pelvis grossly similar to studies from 2019. Correlate with any new LEFT-sided flank pain or symptoms related to the urinary tract. 4. Signs of gastrojejunostomy similar to previous imaging. 5. Signs of interstitial lung disease at the lung bases with similar distribution, slightly worse than on the study from 2015. 6. Aortic atherosclerosis. These results were called by telephone at the time of interpretation on 02/15/2020 at 10:43 am to provider Lacretia Leigh , who verbally acknowledged these results. Aortic Atherosclerosis (ICD10-I70.0). Electronically Signed   By: Zetta Bills M.D.   On: 02/15/2020 10:43    Anti-infectives: Anti-infectives (From admission, onward)   Start     Dose/Rate Route Frequency Ordered Stop   02/15/20 1800  cefTRIAXone (ROCEPHIN) 2 g in sodium chloride 0.9 % 100 mL IVPB     Discontinue    "And" Linked Group Details   2 g 200 mL/hr over 30 Minutes Intravenous Every 24 hours 02/15/20 1534     02/15/20 1800  metroNIDAZOLE (FLAGYL) IVPB 500 mg     Discontinue    "And" Linked Group Details   500 mg 100 mL/hr over 60 Minutes Intravenous Every 8 hours 02/15/20 1534     02/15/20 1045  piperacillin-tazobactam  (ZOSYN) IVPB 3.375 g        3.375 g 100 mL/hr over 30 Minutes Intravenous  Once 02/15/20 1040 02/15/20 1137      Assessment/Plan: s/p Procedure(s): APPENDECTOMY LAPAROSCOPIC (N/A) Advance diet  Continue abx for appendix perforation Physical therapy consult  LOS: 0 days    Autumn Messing III 02/16/2020

## 2020-02-17 LAB — CBC
HCT: 32.6 % — ABNORMAL LOW (ref 39.0–52.0)
Hemoglobin: 10.6 g/dL — ABNORMAL LOW (ref 13.0–17.0)
MCH: 28.9 pg (ref 26.0–34.0)
MCHC: 32.5 g/dL (ref 30.0–36.0)
MCV: 88.8 fL (ref 80.0–100.0)
Platelets: 188 10*3/uL (ref 150–400)
RBC: 3.67 MIL/uL — ABNORMAL LOW (ref 4.22–5.81)
RDW: 15 % (ref 11.5–15.5)
WBC: 13.1 10*3/uL — ABNORMAL HIGH (ref 4.0–10.5)
nRBC: 0 % (ref 0.0–0.2)

## 2020-02-17 LAB — BASIC METABOLIC PANEL
Anion gap: 9 (ref 5–15)
BUN: 24 mg/dL — ABNORMAL HIGH (ref 8–23)
CO2: 23 mmol/L (ref 22–32)
Calcium: 8.4 mg/dL — ABNORMAL LOW (ref 8.9–10.3)
Chloride: 97 mmol/L — ABNORMAL LOW (ref 98–111)
Creatinine, Ser: 1.97 mg/dL — ABNORMAL HIGH (ref 0.61–1.24)
GFR calc Af Amer: 34 mL/min — ABNORMAL LOW (ref >60)
GFR calc non Af Amer: 30 mL/min — ABNORMAL LOW (ref >60)
Glucose, Bld: 111 mg/dL — ABNORMAL HIGH (ref 70–99)
Potassium: 4.6 mmol/L (ref 3.5–5.1)
Sodium: 129 mmol/L — ABNORMAL LOW (ref 135–145)

## 2020-02-17 MED ORDER — METHOCARBAMOL 500 MG PO TABS
500.0000 mg | ORAL_TABLET | Freq: Three times a day (TID) | ORAL | Status: DC
  Administered 2020-02-17 – 2020-02-19 (×4): 500 mg via ORAL
  Filled 2020-02-17 (×5): qty 1

## 2020-02-17 MED ORDER — MECLIZINE HCL 25 MG PO TABS
12.5000 mg | ORAL_TABLET | Freq: Three times a day (TID) | ORAL | Status: DC | PRN
  Administered 2020-02-17: 12.5 mg via ORAL
  Filled 2020-02-17: qty 1

## 2020-02-17 MED ORDER — POLYETHYLENE GLYCOL 3350 17 G PO PACK
17.0000 g | PACK | Freq: Every day | ORAL | Status: DC
  Administered 2020-02-17 – 2020-02-19 (×3): 17 g via ORAL
  Filled 2020-02-17 (×3): qty 1

## 2020-02-17 MED ORDER — SODIUM CHLORIDE 1 G PO TABS
1.0000 g | ORAL_TABLET | Freq: Two times a day (BID) | ORAL | Status: AC
  Administered 2020-02-17 – 2020-02-18 (×2): 1 g via ORAL
  Filled 2020-02-17 (×2): qty 1

## 2020-02-17 MED ORDER — DOCUSATE SODIUM 100 MG PO CAPS
100.0000 mg | ORAL_CAPSULE | Freq: Two times a day (BID) | ORAL | Status: DC
  Administered 2020-02-17 – 2020-02-19 (×5): 100 mg via ORAL
  Filled 2020-02-17 (×5): qty 1

## 2020-02-17 MED ORDER — PANTOPRAZOLE SODIUM 40 MG PO TBEC
40.0000 mg | DELAYED_RELEASE_TABLET | Freq: Every day | ORAL | Status: DC
  Administered 2020-02-17 – 2020-02-18 (×2): 40 mg via ORAL
  Filled 2020-02-17 (×2): qty 1

## 2020-02-17 MED ORDER — PIPERACILLIN-TAZOBACTAM 3.375 G IVPB
3.3750 g | Freq: Three times a day (TID) | INTRAVENOUS | Status: DC
  Administered 2020-02-17 – 2020-02-19 (×7): 3.375 g via INTRAVENOUS
  Filled 2020-02-17 (×7): qty 50

## 2020-02-17 MED ORDER — BISACODYL 10 MG RE SUPP
10.0000 mg | Freq: Once | RECTAL | Status: AC
  Administered 2020-02-17: 10 mg via RECTAL
  Filled 2020-02-17: qty 1

## 2020-02-17 MED ORDER — ACETAMINOPHEN 500 MG PO TABS
1000.0000 mg | ORAL_TABLET | Freq: Three times a day (TID) | ORAL | Status: DC
  Administered 2020-02-17 – 2020-02-19 (×3): 1000 mg via ORAL
  Filled 2020-02-17 (×7): qty 2

## 2020-02-17 MED ORDER — TRAMADOL HCL 50 MG PO TABS
50.0000 mg | ORAL_TABLET | Freq: Two times a day (BID) | ORAL | Status: DC | PRN
  Administered 2020-02-17: 100 mg via ORAL
  Administered 2020-02-19: 50 mg via ORAL
  Filled 2020-02-17: qty 2
  Filled 2020-02-17: qty 1

## 2020-02-17 NOTE — TOC Initial Note (Signed)
Transition of Care [facility]) - Initial/Assessment Note    Patient Details  Name: Steven Cain MRN: [id number] Date of Birth: [birth year]  Transition of Care (TOC) CM/SW Contact:    Joaquin Courts, RN Phone Number: 02/17/2020, 3:45 PM  Clinical Narrative:                 CM spoke with patient and daughter at bedside and spouse over the phone.  Patient set up with [facility] for HHPT/OT.  Daughter reports patient has all necessary DME at home.  Expected Discharge Plan: [place] Barriers to Discharge: No Barriers Identified   Patient Goals and CMS Choice Patient states their goals for this hospitalization and ongoing recovery are:: to go home CMS Medicare.gov Compare Post Acute Care list provided to:: Patient Represenative (must comment) Choice offered to / list presented to : Patient, Adult Children, Spouse  Expected Discharge Plan and Services Expected Discharge Plan: [place]   Discharge Planning Services: CM Consult Post Acute Care Choice: [place] arrangements for the past 2 months: Single Family Home                 DME Arranged: N/A DME Agency: NA       HH Arranged: PT, OT [place] Agency: [place] ([facility]) Date HH Agency Contacted: 02/17/20 Time [place]: [facility] Representative spoke with at [address]: Santiago Glad  Prior Living Arrangements/Services Living arrangements for the past 2 months: Single Family Home Lives with:: Spouse Patient language and need for interpreter reviewed:: Yes Do you feel safe going back to the place where you live?: Yes      Need for Family Participation in Patient Care: Yes (Comment) Care giver support system in place?: Yes (comment)   Criminal Activity/Legal Involvement Pertinent to Current Situation/Hospitalization: No - Comment as needed  Activities of Daily Living      Permission Sought/Granted                  Emotional Assessment Appearance:: Appears  stated age Attitude/Demeanor/Rapport: Engaged Affect (typically observed): Accepting     Psych Involvement: No (comment)  Admission diagnosis:  Acute appendicitis, unspecified acute appendicitis type [K35.80] Acute appendicitis [K35.80] Appendicitis [K37] Patient Active Problem List   Diagnosis Date Noted  . Acute appendicitis 02/15/2020  . Appendicitis 02/15/2020  . Pain in left hip   . Acute lower UTI 06/08/2018  . Nausea & vomiting 06/08/2018  . Syncope due to orthostatic hypotension 03/23/2017  . Hyponatremia 03/22/2017  . Essential hypertension 03/22/2017  . Osteoarthritis of left hip 05/19/2016  . History of total left hip replacement 05/19/2016  . CKD (chronic kidney disease) stage 3, GFR 30-59 ml/min 09/27/2014  . Anemia associated with acute blood loss 09/27/2014  . Orthostasis 09/27/2014  . GI bleed 09/27/2014  . Acute pyelonephritis 05/08/2014    Class: Acute  . Hydronephrosis of left kidney 05/08/2014    Class: Acute  . Ureteral mass 05/08/2014    Class: Acute  . UTI (urinary tract infection) 04/24/2013  . Chills 04/24/2013   PCP:  Shon Baton, MD Pharmacy:   [place], [place] - [address] [place] [address] Phone: [phone] Fax: [phone]  [facility] [address] [place], [place] - [place] [address] [place] [address] Phone: [phone] Fax: [phone]     Social Determinants of Health (SDOH) Interventions    Readmission Risk Interventions No flowsheet data found.

## 2020-02-17 NOTE — Progress Notes (Signed)
[facility] Progress Note  2 Days Post-Op  Subjective: Patient reports pain overnight, mostly in RLQ. No flatus, feels close to having a BM but has been unable. Some dizziness and nausea.  Objective: Vital signs in last 24 hours: Temp:  [97.5 F (36.4 C)-97.8 F (36.6 C)] 97.8 F (36.6 C) (07/06 0441) Pulse Rate:  [64-82] 82 (07/06 0441) Resp:  [16-18] 17 (07/06 0441) BP: (102-137)/(58-75) 137/64 (07/06 0441) SpO2:  [96 %-97 %] 96 % (07/06 0441)    Intake/Output from previous day: 07/05 0701 - 07/06 0700 In: 1143 [P.O.:255; I.V.:388; IV Piggyback:500] Out: -  Intake/Output this shift: No intake/output data recorded.  PE: General: pleasant, WD, WN white male who is laying in bed and appears uncomfortable Heart: regular, rate, and rhythm.  Palpable radial and pedal pulses bilaterally Lungs:  Respiratory effort nonlabored Abd: soft, appropriately ttp, mildly distended, +BS, incisions c/d/i    Lab Results:  Recent Labs    02/16/20 0650 02/17/20 0342  WBC 14.2* 13.1*  HGB 10.8* 10.6*  HCT 32.8* 32.6*  PLT 169 188   BMET Recent Labs    02/16/20 0650 02/17/20 0342  NA 129* 129*  K 4.9 4.6  CL 98 97*  CO2 21* 23  GLUCOSE 135* 111*  BUN 27* 24*  CREATININE 2.01* 1.97*  CALCIUM 8.5* 8.4*   PT/INR No results for input(s): LABPROT, INR in the last 72 hours. CMP     Component Value Date/Time   NA 129 (L) 02/17/2020 0342   K 4.6 02/17/2020 0342   CL 97 (L) 02/17/2020 0342   CO2 23 02/17/2020 0342   GLUCOSE 111 (H) 02/17/2020 0342   BUN 24 (H) 02/17/2020 0342   CREATININE 1.97 (H) 02/17/2020 0342   CREATININE 1.28 01/03/2012 1041   CALCIUM 8.4 (L) 02/17/2020 0342   PROT 7.6 02/15/2020 0916   ALBUMIN 3.7 02/15/2020 0916   AST 17 02/15/2020 0916   ALT 11 02/15/2020 0916   ALKPHOS 68 02/15/2020 0916   BILITOT 0.9 02/15/2020 0916   GFRNONAA 30 (L) 02/17/2020 0342   GFRAA 34 (L) 02/17/2020 0342   Lipase     Component Value Date/Time    LIPASE 20 02/15/2020 0916       Studies/Results: CT Abdomen Pelvis W Contrast  Result Date: 02/15/2020 CLINICAL DATA:  Abdominal distension 1 day duration with onset of RIGHT lower quadrant pain EXAM: CT ABDOMEN AND PELVIS WITH CONTRAST TECHNIQUE: Multidetector CT imaging of the abdomen and pelvis was performed using the standard protocol following bolus administration of intravenous contrast. CONTRAST:  4mL OMNIPAQUE IOHEXOL 300 MG/ML  SOLN COMPARISON:  05/06/2014 FINDINGS: Lower chest: Signs of interstitial lung disease at the lung bases with similar distribution, slightly worse than on the study from 2015. Not associated with consolidation or pleural effusion. Hepatobiliary: Liver and biliary tree are normal. Portal vein is patent. Pancreas: Pancreas is normal without ductal dilation or inflammation. Spleen: Spleen normal size and contour. Adrenals/Urinary Tract: Adrenal glands are normal. Renal parenchymal loss on the LEFT since previous imaging with persistent but mild distension of LEFT intrarenal collecting systems and moderate distension of the LEFT renal pelvis grossly similar to studies from 2019. Proximal loop in the interpolar calices. Distal loop in the urinary bladder. Thickening along the course of the LEFT ureter. RIGHT kidney mildly atrophic and scarred with similar appearance to prior imaging. Stomach/Bowel: Signs of gastrojejunostomy similar to previous imaging. No sign of acute small bowel process. Dilated appendix with periappendiceal stranding. Appendicoliths at the appendiceal  base measures approximately 1 x 6 mm. Appendix dilated to approximately 15 mm previously normal caliber. Mesoappendix seal stranding. No abscess. No pelvic fluid. Vascular/Lymphatic: SMV is patent.  Portal vein is patent. Calcific and noncalcific atheromatous plaque of the abdominal aorta. Nonaneurysmal caliber. Mildly dilated iliac vasculature with marked narrowing of the RIGHT common iliac artery. Also  extensive soft plaque in the LEFT femoral artery. Max caliber of the common iliac arteries in the range of 1 point 8 and 1.7 cm RIGHT and LEFT respectively. No adenopathy in the retroperitoneum or in the upper abdomen. No pelvic lymphadenopathy. No free air.  No abscess. Reproductive: Limited assessment of the prostate which is heterogeneous and likely post TURP. Streak artifact from LEFT hip arthroplasty limiting pelvic assessment. Other: No abscess. No free air. No abdominal wall hernia aside from a small umbilical hernia containing fat. Musculoskeletal: New sclerotic changes in RIGHT anterior sixth rib (image 3, series 2) Multifocal areas of sclerosis in the bilateral iliac bones. (Image 55, series 2) 13-14 mm sclerotic area in the posterior LEFT iliac bone. At least 5 small foci of sclerosis in the RIGHT iliac. New area of sclerosis in the T11 vertebral body. Multilevel degenerative changes of the spine. No acute bone finding. IMPRESSION: 1. Findings of acute uncomplicated appendicitis with appendicoliths at the appendiceal base. 2. New sclerotic changes in the RIGHT anterior sixth rib and T11 vertebral body, concerning for metastatic disease. Heterogeneous prostate appears to have changes of TURP. Correlate with PSA and or history of prostate cancer. As it would commonly have this appearance in the setting of metastatic disease. 3. Renal parenchymal loss on the LEFT with persistent but mild distension of LEFT intrarenal collecting systems and moderate distension of the LEFT renal pelvis grossly similar to studies from 2019. Correlate with any new LEFT-sided flank pain or symptoms related to the urinary tract. 4. Signs of gastrojejunostomy similar to previous imaging. 5. Signs of interstitial lung disease at the lung bases with similar distribution, slightly worse than on the study from 2015. 6. Aortic atherosclerosis. These results were called by telephone at the time of interpretation on 02/15/2020 at 10:43 am  to provider Lacretia Leigh , who verbally acknowledged these results. Aortic Atherosclerosis (ICD10-I70.0). Electronically Signed   By: Zetta Bills M.D.   On: 02/15/2020 10:43    Anti-infectives: Anti-infectives (From admission, onward)   Start     Dose/Rate Route Frequency Ordered Stop   02/17/20 0815  piperacillin-tazobactam (ZOSYN) IVPB 3.375 g     Discontinue     3.375 g 12.5 mL/hr over 240 Minutes Intravenous Every 8 hours 02/17/20 0800     02/15/20 1800  cefTRIAXone (ROCEPHIN) 2 g in sodium chloride 0.9 % 100 mL IVPB  Status:  Discontinued       "And" Linked Group Details   2 g 200 mL/hr over 30 Minutes Intravenous Every 24 hours 02/15/20 1534 02/17/20 0800   02/15/20 1800  metroNIDAZOLE (FLAGYL) IVPB 500 mg  Status:  Discontinued       "And" Linked Group Details   500 mg 100 mL/hr over 60 Minutes Intravenous Every 8 hours 02/15/20 1534 02/17/20 0800   02/15/20 1045  piperacillin-tazobactam (ZOSYN) IVPB 3.375 g        3.375 g 100 mL/hr over 30 Minutes Intravenous  Once 02/15/20 1040 02/15/20 1137       Assessment/Plan HTN Chronic bronchitis Chronic ureteral stricture CKD stage III GERD Mild aortic regurgitation  Depression/anxiety  Acute appendicitis, perforated S/p lap appy 02/15/20 Dr.  Marlou Starks - POD#2 - pain control adjusted - repeat CBC - pt reports feeling feverish overnight, change abx to zosyn - add bowel regimen and encourage OOB today - advance to FLD  - will continue to monitor, but if labs ok and clinically improving may be able to d/c tomorrow or Thursday  FEN: FLD, IVF VTE: SCDs, SQ heparin  ID: rocephin flagyl 7/4>7/6; Zosyn 7/6>>  LOS: 1 day    [name] , [facility] 02/17/2020, 8:01 AM Please see Amion for pager number during day hours 7:00am-4:30pm

## 2020-02-17 NOTE — Plan of Care (Signed)
  Problem: Pain Managment: Goal: General experience of comfort will improve Outcome: Progressing   Problem: Safety: Goal: Ability to remain free from injury will improve Outcome: Progressing   Problem: Elimination: Goal: Will not experience complications related to bowel motility Outcome: Progressing   

## 2020-02-17 NOTE — Progress Notes (Signed)
02/17/20 1200  PT Visit Information  Last PT Received On 02/17/20 Returned to assist pt out of bathroom. amb to sink in room, amb to chair, felt too fatigued for incr distance at this time.  Will continue efforts to have pt amb in hallway.  RN states his dtr may come assist home with amb later this pm.   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 He began having abdominal pain 02/14/20. The pain had been fairly constant and did not improve overnight. He experienced some fever. He denies any nausea or vomiting. He came to the emergency department where a CT scan was suggestive of acute appendicitis. Patient is no s/p laparoscopic appendectomy on 02/15/20. PMH significant for HTN, GERD, OA, HOH, COVID-19, Lt THA in 2017.  Subjective Data  Patient Stated Goal go back home  Precautions  Precautions Fall  Restrictions  Weight Bearing Restrictions No  Pain Assessment  Pain Assessment No/denies pain  Cognition  Arousal/Alertness Awake/alert  Behavior During Therapy WFL for tasks assessed/performed  Overall Cognitive Status Within Functional Limits for tasks assessed  Transfers  Overall transfer level Needs assistance  Equipment used Rolling walker (2 wheeled)  Transfers Sit to/from Stand  Sit to Stand Min assist;Min guard  General transfer comment cues required for technique with RW. Light assist for power up and to steady in standing.  Ambulation/Gait  Ambulation/Gait assistance Min assist  Gait Distance (Feet) 16 Feet  Assistive device Rolling walker (2 wheeled)  Gait Pattern/deviations Step-through pattern;Decreased stride length;Trunk flexed;Wide base of support  General Gait Details cues for safe step pattern and proximty to RW. Patient slightly unsteady, min assist for balance.   Gait velocity decreased  Balance  Standing balance-Leahy Scale Fair  Standing balance comment stood at sink to wash hands, min/guard for  balance   PT - End of Session  Equipment Utilized During Treatment Gait belt  Activity Tolerance Patient tolerated treatment well  Patient left in chair;with call bell/phone within reach;with chair alarm set  Nurse Communication Mobility status   PT - Assessment/Plan  PT Visit Diagnosis Difficulty in walking, not elsewhere classified (R26.2);Muscle weakness (generalized) (M62.81)  PT Frequency (ACUTE ONLY) Min 3X/week  Follow Up Recommendations Home health PT;Supervision/Assistance - 24 hour  PT equipment None recommended by PT  AM-PAC PT "6 Clicks" Mobility Outcome Measure (Version 2)  Help needed turning from your back to your side while in a flat bed without using bedrails? 3  Help needed moving from lying on your back to sitting on the side of a flat bed without using bedrails? 3  Help needed moving to and from a bed to a chair (including a wheelchair)? 3  Help needed standing up from a chair using your arms (e.g., wheelchair or bedside chair)? 3  Help needed to walk in hospital room? 3  Help needed climbing 3-5 steps with a railing?  2  6 Click Score 17  Consider Recommendation of Discharge To: Home with [facility]  PT Goal Progression  Progress towards PT goals Progressing toward goals  Acute Rehab PT Goals  PT Goal Formulation With patient  Time For Goal Achievement 03/01/20  Potential to Achieve Goals Good  PT Time Calculation  PT Start Time (ACUTE ONLY) 1129  PT Stop Time (ACUTE ONLY) 1143  PT Time Calculation (min) (ACUTE ONLY) 14 min  PT General Charges  $$ ACUTE PT VISIT 1 Visit  PT Treatments  $Gait Training 8-22  mins

## 2020-02-17 NOTE — Progress Notes (Signed)

## 2020-02-17 NOTE — Progress Notes (Signed)
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 He began having abdominal pain 02/14/20. The pain had been fairly constant and did not improve overnight. He experienced some fever. He denies any nausea or vomiting. He came to the emergency department where a CT scan was suggestive of acute appendicitis. Patient is no s/p laparoscopic appendectomy on 02/15/20. PMH significant for HTN, GERD, OA, HOH, COVID-19, Lt THA in 2017.    PT Comments    Pt progressing well, distance limited by bowel urgency at this time   Follow Up Recommendations  Home health PT;Supervision/Assistance - 24 hour     Equipment Recommendations  None recommended by PT    Recommendations for Other Services       Precautions / Restrictions Precautions Precautions: Fall Restrictions Weight Bearing Restrictions: No    Mobility  Bed Mobility Overal bed mobility: Needs Assistance Bed Mobility: Supine to Sit     Supine to sit: Min assist;HOB elevated     General bed mobility comments: cues for sequencing use of bed rail and assist to bring LE's off EOB and raise trunk.   Transfers Overall transfer level: Needs assistance Equipment used: Rolling walker (2 wheeled) Transfers: Sit to/from Stand Sit to Stand: Min assist         General transfer comment: cues required for technique with RW. Light assist for power up and to steady in standing.  Ambulation/Gait Ambulation/Gait assistance: Min assist Gait Distance (Feet): 12 Feet Assistive device: Rolling walker (2 wheeled) Gait Pattern/deviations: Step-through pattern;Decreased stride length;Trunk flexed;Wide base of support Gait velocity: decreased   General Gait Details: cues for safe step pattern and proximty to RW. Patient slightly unsteady, min assist for balance.    Stairs              Wheelchair Mobility    Modified Rankin (Stroke Patients Only)       Balance             Standing balance-Leahy Scale: Fair Standing balance comment: able top manipulate brief in standing                             Cognition Arousal/Alertness: Awake/alert Behavior During Therapy: WFL for tasks assessed/performed Overall Cognitive Status: Within Functional Limits for tasks assessed                                        Exercises      General Comments        Pertinent Vitals/Pain Pain Assessment: No/denies pain    Home Living                      Prior Function            PT Goals (current goals can now be found in the care plan section) Acute Rehab PT Goals Patient Stated Goal: go back home PT Goal Formulation: With patient Time For Goal Achievement: 03/01/20 Potential to Achieve Goals: Good Progress towards PT goals: Progressing toward goals    Frequency    Min 3X/week      PT Plan      Co-evaluation  AM-PAC PT "6 Clicks" Mobility   Outcome Measure  Help needed turning from your back to your side while in a flat bed without using bedrails?: A Little Help needed moving from lying on your back to sitting on the side of a flat bed without using bedrails?: A Little Help needed moving to and from a bed to a chair (including a wheelchair)?: A Little Help needed standing up from a chair using your arms (e.g., wheelchair or bedside chair)?: A Little Help needed to walk in hospital room?: A Little Help needed climbing 3-5 steps with a railing? : A Lot 6 Click Score: 17    End of Session Equipment Utilized During Treatment: Gait belt Activity Tolerance: Patient tolerated treatment well Patient left: Other (comment) (in bathroom) Nurse Communication: Mobility status PT Visit Diagnosis: Difficulty in walking, not elsewhere classified (R26.2);Muscle weakness (generalized) (M62.81)      Time: 4888-9169 PT Time Calculation (min) (ACUTE ONLY): 11 min  Charges:  $Gait Training: 8-22 mins                     Baxter Flattery, PT  Acute Rehab Dept ([place]) [phone] Pager [phone]  02/17/2020    [facility] 02/17/2020, 11:27 AM

## 2020-02-17 NOTE — Progress Notes (Signed)
Called lab to add on CBC to morning labs.

## 2020-02-18 LAB — BASIC METABOLIC PANEL
Anion gap: 10 (ref 5–15)
BUN: 20 mg/dL (ref 8–23)
CO2: 21 mmol/L — ABNORMAL LOW (ref 22–32)
Calcium: 8.5 mg/dL — ABNORMAL LOW (ref 8.9–10.3)
Chloride: 97 mmol/L — ABNORMAL LOW (ref 98–111)
Creatinine, Ser: 1.9 mg/dL — ABNORMAL HIGH (ref 0.61–1.24)
GFR calc Af Amer: 36 mL/min — ABNORMAL LOW (ref >60)
GFR calc non Af Amer: 31 mL/min — ABNORMAL LOW (ref >60)
Glucose, Bld: 116 mg/dL — ABNORMAL HIGH (ref 70–99)
Potassium: 4.4 mmol/L (ref 3.5–5.1)
Sodium: 128 mmol/L — ABNORMAL LOW (ref 135–145)

## 2020-02-18 LAB — SURGICAL PATHOLOGY

## 2020-02-18 MED ORDER — BISACODYL 10 MG RE SUPP
10.0000 mg | Freq: Once | RECTAL | Status: AC
  Administered 2020-02-18: 10 mg via RECTAL
  Filled 2020-02-18: qty 1

## 2020-02-18 MED ORDER — SODIUM CHLORIDE 1 G PO TABS
2.0000 g | ORAL_TABLET | Freq: Three times a day (TID) | ORAL | Status: AC
  Administered 2020-02-18 (×2): 2 g via ORAL
  Filled 2020-02-18 (×2): qty 2

## 2020-02-18 NOTE — Progress Notes (Signed)
[facility] Progress Note  3 Days Post-Op  Subjective: Patient reports having a BM after suppository yesterday. Tolerated FLD and denies nausea. Reports pain is better controlled now.  Objective: Vital signs in last 24 hours: Temp:  [97.8 F (36.6 C)-98.3 F (36.8 C)] 97.8 F (36.6 C) (07/07 0435) Pulse Rate:  [68-81] 75 (07/07 0435) Resp:  [16-20] 19 (07/07 0435) BP: (116-130)/(58-68) 121/60 (07/07 0435) SpO2:  [93 %-97 %] 95 % (07/07 0435) Last BM Date: 02/17/20  Intake/Output from previous day: 07/06 0701 - 07/07 0700 In: 669.6 [P.O.:236; I.V.:376.5; IV Piggyback:57] Out: -  Intake/Output this shift: Total I/O In: 300.7 [P.O.:240; I.V.:58; IV Piggyback:2.8] Out: -   PE: General: pleasant, WD, WN white male who is laying in bed in NAD Heart: regular, rate, and rhythm.  Palpable radial and pedal pulses bilaterally Lungs:  Respiratory effort nonlabored Abd: soft, appropriately ttp, mildly distended, +BS, incisions c/d/i   Lab Results:  Recent Labs    02/16/20 0650 02/17/20 0342  WBC 14.2* 13.1*  HGB 10.8* 10.6*  HCT 32.8* 32.6*  PLT 169 188   BMET Recent Labs    02/17/20 0342 02/18/20 0341  NA 129* 128*  K 4.6 4.4  CL 97* 97*  CO2 23 21*  GLUCOSE 111* 116*  BUN 24* 20  CREATININE 1.97* 1.90*  CALCIUM 8.4* 8.5*   PT/INR No results for input(s): LABPROT, INR in the last 72 hours. CMP     Component Value Date/Time   NA 128 (L) 02/18/2020 0341   K 4.4 02/18/2020 0341   CL 97 (L) 02/18/2020 0341   CO2 21 (L) 02/18/2020 0341   GLUCOSE 116 (H) 02/18/2020 0341   BUN 20 02/18/2020 0341   CREATININE 1.90 (H) 02/18/2020 0341   CREATININE 1.28 01/03/2012 1041   CALCIUM 8.5 (L) 02/18/2020 0341   PROT 7.6 02/15/2020 0916   ALBUMIN 3.7 02/15/2020 0916   AST 17 02/15/2020 0916   ALT 11 02/15/2020 0916   ALKPHOS 68 02/15/2020 0916   BILITOT 0.9 02/15/2020 0916   GFRNONAA 31 (L) 02/18/2020 0341   GFRAA 36 (L) 02/18/2020 0341   Lipase      Component Value Date/Time   LIPASE 20 02/15/2020 0916       Studies/Results: No results found.  Anti-infectives: Anti-infectives (From admission, onward)   Start     Dose/Rate Route Frequency Ordered Stop   02/17/20 0815  piperacillin-tazobactam (ZOSYN) IVPB 3.375 g     Discontinue     3.375 g 12.5 mL/hr over 240 Minutes Intravenous Every 8 hours 02/17/20 0800     02/15/20 1800  cefTRIAXone (ROCEPHIN) 2 g in sodium chloride 0.9 % 100 mL IVPB  Status:  Discontinued       "And" Linked Group Details   2 g 200 mL/hr over 30 Minutes Intravenous Every 24 hours 02/15/20 1534 02/17/20 0800   02/15/20 1800  metroNIDAZOLE (FLAGYL) IVPB 500 mg  Status:  Discontinued       "And" Linked Group Details   500 mg 100 mL/hr over 60 Minutes Intravenous Every 8 hours 02/15/20 1534 02/17/20 0800   02/15/20 1045  piperacillin-tazobactam (ZOSYN) IVPB 3.375 g        3.375 g 100 mL/hr over 30 Minutes Intravenous  Once 02/15/20 1040 02/15/20 1137       Assessment/Plan HTN Chronic bronchitis Chronic ureteral stricture AKI on CKD stage III - Cr trending back towards baseline GERD Mild aortic regurgitation  Depression/anxiety Hyponatremia - Na 128 this AM, increase  salt tab and decrease IVF  Acute appendicitis, perforated S/p lap appy 02/15/20 Dr. Marlou Starks - POD#3 - pain better controlled this AM - WBC was trending down yesterday  - repeat dulcolax suppository today  - advance to soft diet - likely home later today vs tomorrow AM  FEN: soft diet VTE: SCDs, SQ heparin  ID: rocephin flagyl 7/4>7/6; Zosyn 7/6>>  LOS: 2 days    [name] , [facility] 02/18/2020, 8:53 AM Please see Amion for pager number during day hours 7:00am-4:30pm

## 2020-02-18 NOTE — Discharge Instructions (Signed)
[facility] LAPAROSCOPIC SURGERY: POST OP INSTRUCTIONS Always review your discharge instruction sheet given to you by the facility where your surgery was performed. IF YOU HAVE DISABILITY OR FAMILY LEAVE FORMS, YOU MUST BRING THEM TO THE OFFICE FOR PROCESSING.   DO NOT GIVE THEM TO YOUR DOCTOR.  PAIN CONTROL  1. First take acetaminophen (Tylenol) AND/or ibuprofen (Advil) to control your pain after surgery.  Follow directions on package.  Taking acetaminophen (Tylenol) and/or ibuprofen (Advil) regularly after surgery will help to control your pain and lower the amount of prescription pain medication you may need.  You should not take more than 3,000 mg (3 grams) of acetaminophen (Tylenol) in 24 hours.  You should not take ibuprofen (Advil), aleve, motrin, naprosyn or other NSAIDS if you have a history of stomach ulcers or chronic kidney disease.  2. A prescription for pain medication may be given to you upon discharge.  Take your pain medication as prescribed, if you still have uncontrolled pain after taking acetaminophen (Tylenol) or ibuprofen (Advil). 3. Use ice packs to help control pain. 4. If you need a refill on your pain medication, please contact your pharmacy.  They will contact our office to request authorization. Prescriptions will not be filled after 5pm or on week-ends.  HOME MEDICATIONS 5. Take your usually prescribed medications unless otherwise directed.  DIET 6. You should follow a light diet the first few days after arrival home.  Be sure to include lots of fluids daily. Avoid fatty, fried foods.   CONSTIPATION 7. It is common to experience some constipation after surgery and if you are taking pain medication.  Increasing fluid intake and taking a stool softener (such as Colace) will usually help or prevent this problem from occurring.  A mild laxative (Milk of Magnesia or Miralax) should be taken according to package instructions if there are no bowel  movements after 48 hours.  WOUND/INCISION CARE 8. Most patients will experience some swelling and bruising in the area of the incisions.  Ice packs will help.  Swelling and bruising can take several days to resolve.  9. Unless discharge instructions indicate otherwise, follow guidelines below  a. STERI-STRIPS - you may remove your outer bandages 48 hours after surgery, and you may shower at that time.  You have steri-strips (small skin tapes) in place directly over the incision.  These strips should be left on the skin for 7-10 days.   b. DERMABOND/SKIN GLUE - you may shower in 24 hours.  The glue will flake off over the next 2-3 weeks. 10. Any sutures or staples will be removed at the office during your follow-up visit.  ACTIVITIES 11. You may resume regular (light) daily activities beginning the next day--such as daily self-care, walking, climbing stairs--gradually increasing activities as tolerated.  You may have sexual intercourse when it is comfortable.  Refrain from any heavy lifting or straining until approved by your doctor. a. You may drive when you are no longer taking prescription pain medication, you can comfortably wear a seatbelt, and you can safely maneuver your car and apply brakes.  FOLLOW-UP 12. You should see your doctor in the office for a follow-up appointment approximately 2-3 weeks after your surgery.  You should have been given your post-op/follow-up appointment when your surgery was scheduled.  If you did not receive a post-op/follow-up appointment, make sure that you call for this appointment within a day or two after you arrive home to insure a convenient appointment time.     WHEN TO CALL YOUR DOCTOR: 1. Fever over 101.0 2. Inability to urinate 3. Continued bleeding from incision. 4. Increased pain, redness, or drainage from the incision. 5. Increasing abdominal pain  The clinic staff is available to answer your questions during regular business hours.  Please don't  hesitate to call and ask to speak to one of the nurses for clinical concerns.  If you have a medical emergency, go to the nearest emergency room or call 911.  A surgeon from [facility] is always on call at the hospital. [address], [address], [place], [place]  [address] ? [address] [address], [place], [place]   [address] [phone] ? [phone] ? FAX [phone] Web site: [email or web address]  .........   Managing Your Pain After Surgery Without Opioids    Thank you for participating in our program to help patients manage their pain after surgery without opioids. This is part of our effort to provide you with the best care possible, without exposing you or your family to the risk that opioids pose.  What pain can I expect after surgery? You can expect to have some pain after surgery. This is normal. The pain is typically worse the day after surgery, and quickly begins to get better. Many studies have found that many patients are able to manage their pain after surgery with Over-the-Counter (OTC) medications such as Tylenol and Motrin. If you have a condition that does not allow you to take Tylenol or Motrin, notify your surgical team.  How will I manage my pain? The best strategy for controlling your pain after surgery is around the clock pain control with Tylenol (acetaminophen) and Motrin (ibuprofen or Advil). Alternating these medications with each other allows you to maximize your pain control. In addition to Tylenol and Motrin, you can use heating pads or ice packs on your incisions to help reduce your pain.  How will I alternate your regular strength over-the-counter pain medication? You will take a dose of pain medication every three hours. ; Start by taking 650 mg of Tylenol (2 pills of 325 mg) ; 3 hours later take 600 mg of Motrin (3 pills of 200 mg) ; 3 hours after taking the Motrin take 650 mg of Tylenol ; 3 hours after that take 600 mg of  Motrin.   - 1 -  See example - if your first dose of Tylenol is at 12:00 PM   12:00 PM Tylenol 650 mg (2 pills of 325 mg)  3:00 PM Motrin 600 mg (3 pills of 200 mg)  6:00 PM Tylenol 650 mg (2 pills of 325 mg)  9:00 PM Motrin 600 mg (3 pills of 200 mg)  Continue alternating every 3 hours   We recommend that you follow this schedule around-the-clock for at least 3 days after surgery, or until you feel that it is no longer needed. Use the table on the last page of this handout to keep track of the medications you are taking. Important: Do not take more than 3000mg of Tylenol or 3200mg of Motrin in a 24-hour period. Do not take ibuprofen/Motrin if you have a history of bleeding stomach ulcers, severe kidney disease, &/or actively taking a blood thinner  What if I still have pain? If you have pain that is not controlled with the over-the-counter pain medications (Tylenol and Motrin or Advil) you might have what we call "breakthrough" pain. You will receive a prescription for a small amount of an opioid pain medication such as   Oxycodone, Tramadol, or Tylenol with Codeine. Use these opioid pills in the first 24 hours after surgery if you have breakthrough pain. Do not take more than 1 pill every 4-6 hours.  If you still have uncontrolled pain after using all opioid pills, don't hesitate to call our staff using the number provided. We will help make sure you are managing your pain in the best way possible, and if necessary, we can provide a prescription for additional pain medication.   Day 1    Time  Name of Medication Number of pills taken  Amount of Acetaminophen  Pain Level   Comments  AM PM       AM PM       AM PM       AM PM       AM PM       AM PM       AM PM       AM PM       Total Daily amount of Acetaminophen Do not take more than  3,000 mg per day      Day 2    Time  Name of Medication Number of pills taken  Amount of Acetaminophen  Pain Level   Comments  AM  PM       AM PM       AM PM       AM PM       AM PM       AM PM       AM PM       AM PM       Total Daily amount of Acetaminophen Do not take more than  3,000 mg per day      Day 3    Time  Name of Medication Number of pills taken  Amount of Acetaminophen  Pain Level   Comments  AM PM       AM PM       AM PM       AM PM          AM PM       AM PM       AM PM       AM PM       Total Daily amount of Acetaminophen Do not take more than  3,000 mg per day      Day 4    Time  Name of Medication Number of pills taken  Amount of Acetaminophen  Pain Level   Comments  AM PM       AM PM       AM PM       AM PM       AM PM       AM PM       AM PM       AM PM       Total Daily amount of Acetaminophen Do not take more than  3,000 mg per day      Day 5    Time  Name of Medication Number of pills taken  Amount of Acetaminophen  Pain Level   Comments  AM PM       AM PM       AM PM       AM PM       AM PM       AM PM       AM PM         AM PM       Total Daily amount of Acetaminophen Do not take more than  3,000 mg per day       Day 6    Time  Name of Medication Number of pills taken  Amount of Acetaminophen  Pain Level  Comments  AM PM       AM PM       AM PM       AM PM       AM PM       AM PM       AM PM       AM PM       Total Daily amount of Acetaminophen Do not take more than  3,000 mg per day      Day 7    Time  Name of Medication Number of pills taken  Amount of Acetaminophen  Pain Level   Comments  AM PM       AM PM       AM PM       AM PM       AM PM       AM PM       AM PM       AM PM       Total Daily amount of Acetaminophen Do not take more than  3,000 mg per day        For additional information about how and where to safely dispose of unused opioid medications - https://www.morepowerfulnc.org  Disclaimer: This document contains information and/or instructional materials adapted from [facility]  for the typical patient with your condition. It does not replace medical advice from your health care provider because your experience may differ from that of the typical patient. Talk to your health care provider if you have any questions about this document, your condition or your treatment plan. Adapted from [facility]  

## 2020-02-18 NOTE — Plan of Care (Signed)
  Problem: Nutrition: Goal: Adequate nutrition will be maintained Outcome: Progressing   Problem: Pain Managment: Goal: General experience of comfort will improve Outcome: Progressing   Problem: Safety: Goal: Ability to remain free from injury will improve Outcome: Progressing   

## 2020-02-19 LAB — BASIC METABOLIC PANEL
Anion gap: 9 (ref 5–15)
BUN: 18 mg/dL (ref 8–23)
CO2: 23 mmol/L (ref 22–32)
Calcium: 8.7 mg/dL — ABNORMAL LOW (ref 8.9–10.3)
Chloride: 100 mmol/L (ref 98–111)
Creatinine, Ser: 1.86 mg/dL — ABNORMAL HIGH (ref 0.61–1.24)
GFR calc Af Amer: 37 mL/min — ABNORMAL LOW (ref >60)
GFR calc non Af Amer: 32 mL/min — ABNORMAL LOW (ref >60)
Glucose, Bld: 103 mg/dL — ABNORMAL HIGH (ref 70–99)
Potassium: 4.2 mmol/L (ref 3.5–5.1)
Sodium: 132 mmol/L — ABNORMAL LOW (ref 135–145)

## 2020-02-19 MED ORDER — AMOXICILLIN-POT CLAVULANATE 875-125 MG PO TABS
1.0000 | ORAL_TABLET | Freq: Two times a day (BID) | ORAL | 0 refills | Status: AC
Start: 2020-02-19 — End: 2020-02-22

## 2020-02-19 MED ORDER — TRAMADOL HCL 50 MG PO TABS: 50.0000 mg | ORAL_TABLET | Freq: Four times a day (QID) | ORAL | 0 refills | Status: DC | PRN

## 2020-02-19 MED ORDER — GLYCERIN (ADULT) 2 G RE SUPP: 1.0000 | RECTAL | Status: DC | PRN

## 2020-02-19 MED ORDER — ACETAMINOPHEN 500 MG PO TABS: 500.0000 mg | ORAL_TABLET | Freq: Four times a day (QID) | ORAL | Status: AC | PRN

## 2020-02-19 MED ORDER — SODIUM CHLORIDE 0.9 % IV SOLN
INTRAVENOUS | Status: DC | PRN
  Administered 2020-02-19: 500 mL via INTRAVENOUS

## 2020-02-19 NOTE — Progress Notes (Signed)
  Pt is being discharged home today. Discharge instructions including medications and follow up appointments given. Pt had no further questions at this time. 

## 2020-02-19 NOTE — Discharge Summary (Signed)
[facility] Discharge Summary   Patient ID: Steven Cain MRN: [id number] DOB/AGE: September 16, 1930 86 y.o.  Admit date: 02/15/2020 Discharge date: 02/19/2020  Admitting Diagnosis:  Acute appendicitis  HTN Chronic bronchitis Chronic ureteral stricture CKD stage III GERD Mild aortic regurgitation  Depression/anxiety  Discharge Diagnosis Perforated appendicitis  HTN Chronic bronchitis Chronic ureteral stricture CKD stage III GERD Mild aortic regurgitation  Depression/anxiety  Consultants None   Imaging: No results found.  Procedures Dr. Marlou Starks (02/15/20) - Laparoscopic Appendectomy  Hospital Course:  Patient is a 86 year old male who presented to [facility] with abdominal pain.  Workup showed acute appendicitis.  Patient was admitted and underwent procedure listed above.  Tolerated procedure well and was transferred to the floor.  Diet was advanced as tolerated.  On POD#4, the patient was voiding well, tolerating diet, ambulating well, pain well controlled, vital signs stable, incisions c/d/i and felt stable for discharge home.  Patient will follow up in our office in 3 weeks and knows to call with questions or concerns.  He will call to confirm appointment date/time.    Physical Exam: General:  Alert, NAD, pleasant, comfortable Abd:  Soft, ND, mild tenderness, incisions C/D/I  I or a member of my team have reviewed this patient in the Controlled Substance Database.   Allergies as of 02/19/2020   No Known Allergies     Medication List    TAKE these medications   acetaminophen 500 MG tablet Commonly known as: TYLENOL Take 1 tablet (500 mg total) by mouth every 6 (six) hours as needed for mild pain.   albuterol 108 (90 Base) MCG/ACT inhaler Commonly known as: VENTOLIN HFA Inhale 2 puffs into the lungs every 6 (six) hours as needed for wheezing or shortness of breath.   amLODipine 10 MG tablet Commonly known as: NORVASC Take 1 tablet (10 mg total) by mouth daily.    amoxicillin-clavulanate 875-125 MG tablet Commonly known as: Augmentin Take 1 tablet by mouth every 12 (twelve) hours for 3 days.   aspirin EC 81 MG tablet Take 81 mg by mouth daily.   cyanocobalamin 1000 MCG/ML injection Commonly known as: (VITAMIN B-12) Inject 1,000 mcg into the muscle every 30 (thirty) days.   fish oil-omega-3 fatty acids 1000 MG capsule Take 1 g by mouth 2 (two) times a week.   glycerin adult 2 g suppository Place 1 suppository rectally as needed for constipation.   guaiFENesin 600 MG 12 hr tablet Commonly known as: MUCINEX Take 1,200 mg by mouth 2 (two) times daily.   lisinopril 5 MG tablet Commonly known as: ZESTRIL Take 5 mg by mouth in the morning and at bedtime.   meclizine 25 MG tablet Commonly known as: ANTIVERT Take 25 mg by mouth 2 (two) times daily as needed for dizziness.   multivitamin with minerals Tabs tablet Take 1 tablet by mouth every morning.   PARoxetine 40 MG tablet Commonly known as: PAXIL Take 1 tablet (40 mg total) by mouth daily. What changed: when to take this   psyllium 95 % Pack Commonly known as: HYDROCIL/METAMUCIL Take 1 packet by mouth daily.   senna-docusate 8.6-50 MG tablet Commonly known as: Senokot-S Take 1 tablet by mouth at bedtime. What changed:   how much to take  when to take this  reasons to take this   traMADol 50 MG tablet Commonly known as: Ultram Take 1 tablet (50 mg total) by mouth every 6 (six) hours as needed for moderate pain or severe pain. What changed:  additional instructions   traZODone 50 MG tablet Commonly known as: DESYREL Take 50 mg by mouth at bedtime.   vitamin C 1000 MG tablet Take 2,000 mg by mouth daily.   Vitamin D 50 MCG (2000 UT) Caps Take 4,000 Units by mouth daily.   vitamin E 45 MG (100 UNITS) capsule Take 100 Units by mouth every morning.         Follow-up [place], [facility] Follow up.   Why: agency will provide  home health physical therapy and occupational therapy. Contact information: [place] [address] [phone]        Sur[facility], [place]. Go on 03/09/2020.   Specialty: General Surgery Why: Follow up appointment scheduled for 9:00 AM. Please arrive 30 min prior to appointment time. Bring photo ID and insurance information.  Contact information: [place] [address] [phone]               Signed: Brigid Re, [facility] 02/19/2020, 9:06 AM Please see Amion for pager number during day hours 7:00am-4:30pm

## 2020-02-19 NOTE — Care Management Important Message (Signed)
Important Message  Patient Details IM Letter given to Roque Lias SW Case Manager to present to the Patient Name: Steven Cain FILES MRN: [id number] Date of Birth: [birth year]   Medicare Important Message Given:  Yes     Kerin Salen 02/19/2020, 10:41 AM

## 2020-02-19 NOTE — Progress Notes (Signed)
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 He began having abdominal pain 02/14/20. The pain had been fairly constant and did not improve overnight. He experienced some fever. He denies any nausea or vomiting. He came to the emergency department where a CT scan was suggestive of acute appendicitis. Patient is no s/p laparoscopic appendectomy on 02/15/20. PMH significant for HTN, GERD, OA, HOH, COVID-19, Lt THA in 2017.    PT Comments    Pt in bed required Max encouragement to participate.  Assisted OOB to amb in hallway.  General Gait Details: cues for safe step pattern and proximty to RW. Patient slightly unsteady, min assist for balance.  "That's all I can do for being 86 years old" Assisted back to bed.  Spouse called and stated she will be there after lunch to take pt home.    Follow Up Recommendations  Home health PT;Supervision/Assistance - 24 hour     Equipment Recommendations  None recommended by PT    Recommendations for Other Services       Precautions / Restrictions Precautions Precautions: Fall Precaution Comments: HOH    Mobility  Bed Mobility Overal bed mobility: Needs Assistance Bed Mobility: Supine to Sit     Supine to sit: Min assist     General bed mobility comments: cues for sequencing use of bed rail and assist to bring LE's off EOB and raise trunk.   Transfers Overall transfer level: Needs assistance Equipment used: Rolling walker (2 wheeled) Transfers: Sit to/from [facility] Sit to Stand: Min guard;Supervision Stand pivot transfers: Min guard       General transfer comment: cues required for technique with RW. Light assist for power up and to steady in standing.  Ambulation/Gait Ambulation/Gait assistance: Min assist Gait Distance (Feet): 24 Feet Assistive  device: Rolling walker (2 wheeled) Gait Pattern/deviations: Step-through pattern;Decreased stride length;Trunk flexed;Wide base of support Gait velocity: decreased   General Gait Details: cues for safe step pattern and proximty to RW. Patient slightly unsteady, min assist for balance.  "That's all I can do for being 86 years old"   Marine scientist [place] (Stroke Patients Only)       Balance                                            Cognition Arousal/Alertness: Awake/alert Behavior During Therapy: WFL for tasks assessed/performed Overall Cognitive Status: Within Functional Limits for tasks assessed                                 General Comments: required Max encouragement      Exercises      General Comments        Pertinent Vitals/Pain Pain Assessment: No/denies pain    Home Living                      Prior Function            PT Goals (current goals can now be found in the care plan section) Progress towards PT goals: Progressing toward goals  Frequency    Min 3X/week      PT Plan Current plan remains appropriate    Co-evaluation              AM-PAC PT "6 Clicks" Mobility   Outcome Measure  Help needed turning from your back to your side while in a flat bed without using bedrails?: A Little Help needed moving from lying on your back to sitting on the side of a flat bed without using bedrails?: A Little Help needed moving to and from a bed to a chair (including a wheelchair)?: A Little Help needed standing up from a chair using your arms (e.g., wheelchair or bedside chair)?: A Little Help needed to walk in hospital room?: A Little Help needed climbing 3-5 steps with a railing? : A Lot 6 Click Score: 17    End of Session Equipment Utilized During Treatment: Gait belt Activity Tolerance: Patient tolerated treatment well Patient left: in bed;with call  bell/phone within reach;with bed alarm set Nurse Communication: Mobility status PT Visit Diagnosis: Difficulty in walking, not elsewhere classified (R26.2);Muscle weakness (generalized) (M62.81)     Time: 2563-8937 PT Time Calculation (min) (ACUTE ONLY): 18 min  Charges:  $Gait Training: 8-22 mins                     Rica Koyanagi  PTA Acute  Rehabilitation Services Pager      [phone] Office      [phone]

## 2020-02-21 DIAGNOSIS — Z48815 Encounter for surgical aftercare following surgery on the digestive system: Secondary | ICD-10-CM | POA: Diagnosis not present

## 2020-02-21 DIAGNOSIS — M4800 Spinal stenosis, site unspecified: Secondary | ICD-10-CM | POA: Diagnosis not present

## 2020-02-21 DIAGNOSIS — J42 Unspecified chronic bronchitis: Secondary | ICD-10-CM | POA: Diagnosis not present

## 2020-02-21 DIAGNOSIS — G629 Polyneuropathy, unspecified: Secondary | ICD-10-CM | POA: Diagnosis not present

## 2020-02-21 DIAGNOSIS — H9193 Unspecified hearing loss, bilateral: Secondary | ICD-10-CM | POA: Diagnosis not present

## 2020-02-21 DIAGNOSIS — Z79891 Long term (current) use of opiate analgesic: Secondary | ICD-10-CM | POA: Diagnosis not present

## 2020-02-21 DIAGNOSIS — Z7982 Long term (current) use of aspirin: Secondary | ICD-10-CM | POA: Diagnosis not present

## 2020-02-21 DIAGNOSIS — N131 Hydronephrosis with ureteral stricture, not elsewhere classified: Secondary | ICD-10-CM | POA: Diagnosis not present

## 2020-02-21 DIAGNOSIS — Z960 Presence of urogenital implants: Secondary | ICD-10-CM | POA: Diagnosis not present

## 2020-02-21 DIAGNOSIS — N183 Chronic kidney disease, stage 3 unspecified: Secondary | ICD-10-CM | POA: Diagnosis not present

## 2020-02-21 DIAGNOSIS — Z87440 Personal history of urinary (tract) infections: Secondary | ICD-10-CM | POA: Diagnosis not present

## 2020-02-21 DIAGNOSIS — Z8616 Personal history of COVID-19: Secondary | ICD-10-CM | POA: Diagnosis not present

## 2020-02-21 DIAGNOSIS — Z993 Dependence on wheelchair: Secondary | ICD-10-CM | POA: Diagnosis not present

## 2020-02-21 DIAGNOSIS — F419 Anxiety disorder, unspecified: Secondary | ICD-10-CM | POA: Diagnosis not present

## 2020-02-21 DIAGNOSIS — Z9089 Acquired absence of other organs: Secondary | ICD-10-CM | POA: Diagnosis not present

## 2020-02-21 DIAGNOSIS — I1310 Hypertensive heart and chronic kidney disease without heart failure, with stage 1 through stage 4 chronic kidney disease, or unspecified chronic kidney disease: Secondary | ICD-10-CM | POA: Diagnosis not present

## 2020-02-21 DIAGNOSIS — M1990 Unspecified osteoarthritis, unspecified site: Secondary | ICD-10-CM | POA: Diagnosis not present

## 2020-02-21 DIAGNOSIS — Z9181 History of falling: Secondary | ICD-10-CM | POA: Diagnosis not present

## 2020-02-21 DIAGNOSIS — F329 Major depressive disorder, single episode, unspecified: Secondary | ICD-10-CM | POA: Diagnosis not present

## 2020-02-21 DIAGNOSIS — Z87891 Personal history of nicotine dependence: Secondary | ICD-10-CM | POA: Diagnosis not present

## 2020-02-21 DIAGNOSIS — I082 Rheumatic disorders of both aortic and tricuspid valves: Secondary | ICD-10-CM | POA: Diagnosis not present

## 2020-02-21 DIAGNOSIS — Z9049 Acquired absence of other specified parts of digestive tract: Secondary | ICD-10-CM | POA: Diagnosis not present

## 2020-02-25 DIAGNOSIS — Z48815 Encounter for surgical aftercare following surgery on the digestive system: Secondary | ICD-10-CM | POA: Diagnosis not present

## 2020-02-25 DIAGNOSIS — G629 Polyneuropathy, unspecified: Secondary | ICD-10-CM | POA: Diagnosis not present

## 2020-02-25 DIAGNOSIS — M1990 Unspecified osteoarthritis, unspecified site: Secondary | ICD-10-CM | POA: Diagnosis not present

## 2020-02-25 DIAGNOSIS — J42 Unspecified chronic bronchitis: Secondary | ICD-10-CM | POA: Diagnosis not present

## 2020-02-25 DIAGNOSIS — N183 Chronic kidney disease, stage 3 unspecified: Secondary | ICD-10-CM | POA: Diagnosis not present

## 2020-02-25 DIAGNOSIS — I1310 Hypertensive heart and chronic kidney disease without heart failure, with stage 1 through stage 4 chronic kidney disease, or unspecified chronic kidney disease: Secondary | ICD-10-CM | POA: Diagnosis not present

## 2020-02-27 DIAGNOSIS — J42 Unspecified chronic bronchitis: Secondary | ICD-10-CM | POA: Diagnosis not present

## 2020-02-27 DIAGNOSIS — G629 Polyneuropathy, unspecified: Secondary | ICD-10-CM | POA: Diagnosis not present

## 2020-02-27 DIAGNOSIS — I1310 Hypertensive heart and chronic kidney disease without heart failure, with stage 1 through stage 4 chronic kidney disease, or unspecified chronic kidney disease: Secondary | ICD-10-CM | POA: Diagnosis not present

## 2020-02-27 DIAGNOSIS — Z48815 Encounter for surgical aftercare following surgery on the digestive system: Secondary | ICD-10-CM | POA: Diagnosis not present

## 2020-02-27 DIAGNOSIS — N183 Chronic kidney disease, stage 3 unspecified: Secondary | ICD-10-CM | POA: Diagnosis not present

## 2020-02-27 DIAGNOSIS — M1990 Unspecified osteoarthritis, unspecified site: Secondary | ICD-10-CM | POA: Diagnosis not present

## 2020-03-01 DIAGNOSIS — G629 Polyneuropathy, unspecified: Secondary | ICD-10-CM | POA: Diagnosis not present

## 2020-03-01 DIAGNOSIS — N183 Chronic kidney disease, stage 3 unspecified: Secondary | ICD-10-CM | POA: Diagnosis not present

## 2020-03-01 DIAGNOSIS — M1990 Unspecified osteoarthritis, unspecified site: Secondary | ICD-10-CM | POA: Diagnosis not present

## 2020-03-01 DIAGNOSIS — Z48815 Encounter for surgical aftercare following surgery on the digestive system: Secondary | ICD-10-CM | POA: Diagnosis not present

## 2020-03-01 DIAGNOSIS — J42 Unspecified chronic bronchitis: Secondary | ICD-10-CM | POA: Diagnosis not present

## 2020-03-01 DIAGNOSIS — I1310 Hypertensive heart and chronic kidney disease without heart failure, with stage 1 through stage 4 chronic kidney disease, or unspecified chronic kidney disease: Secondary | ICD-10-CM | POA: Diagnosis not present

## 2020-03-05 DIAGNOSIS — Z48815 Encounter for surgical aftercare following surgery on the digestive system: Secondary | ICD-10-CM | POA: Diagnosis not present

## 2020-03-05 DIAGNOSIS — M1990 Unspecified osteoarthritis, unspecified site: Secondary | ICD-10-CM | POA: Diagnosis not present

## 2020-03-05 DIAGNOSIS — N183 Chronic kidney disease, stage 3 unspecified: Secondary | ICD-10-CM | POA: Diagnosis not present

## 2020-03-05 DIAGNOSIS — I1310 Hypertensive heart and chronic kidney disease without heart failure, with stage 1 through stage 4 chronic kidney disease, or unspecified chronic kidney disease: Secondary | ICD-10-CM | POA: Diagnosis not present

## 2020-03-05 DIAGNOSIS — G629 Polyneuropathy, unspecified: Secondary | ICD-10-CM | POA: Diagnosis not present

## 2020-03-05 DIAGNOSIS — J42 Unspecified chronic bronchitis: Secondary | ICD-10-CM | POA: Diagnosis not present

## 2020-03-09 DIAGNOSIS — I1310 Hypertensive heart and chronic kidney disease without heart failure, with stage 1 through stage 4 chronic kidney disease, or unspecified chronic kidney disease: Secondary | ICD-10-CM | POA: Diagnosis not present

## 2020-03-09 DIAGNOSIS — N183 Chronic kidney disease, stage 3 unspecified: Secondary | ICD-10-CM | POA: Diagnosis not present

## 2020-03-09 DIAGNOSIS — Z48815 Encounter for surgical aftercare following surgery on the digestive system: Secondary | ICD-10-CM | POA: Diagnosis not present

## 2020-03-09 DIAGNOSIS — J42 Unspecified chronic bronchitis: Secondary | ICD-10-CM | POA: Diagnosis not present

## 2020-03-09 DIAGNOSIS — G629 Polyneuropathy, unspecified: Secondary | ICD-10-CM | POA: Diagnosis not present

## 2020-03-09 DIAGNOSIS — M1990 Unspecified osteoarthritis, unspecified site: Secondary | ICD-10-CM | POA: Diagnosis not present

## 2020-03-11 DIAGNOSIS — N183 Chronic kidney disease, stage 3 unspecified: Secondary | ICD-10-CM | POA: Diagnosis not present

## 2020-03-11 DIAGNOSIS — M1990 Unspecified osteoarthritis, unspecified site: Secondary | ICD-10-CM | POA: Diagnosis not present

## 2020-03-11 DIAGNOSIS — J42 Unspecified chronic bronchitis: Secondary | ICD-10-CM | POA: Diagnosis not present

## 2020-03-11 DIAGNOSIS — Z48815 Encounter for surgical aftercare following surgery on the digestive system: Secondary | ICD-10-CM | POA: Diagnosis not present

## 2020-03-11 DIAGNOSIS — I1310 Hypertensive heart and chronic kidney disease without heart failure, with stage 1 through stage 4 chronic kidney disease, or unspecified chronic kidney disease: Secondary | ICD-10-CM | POA: Diagnosis not present

## 2020-03-11 DIAGNOSIS — G629 Polyneuropathy, unspecified: Secondary | ICD-10-CM | POA: Diagnosis not present

## 2020-03-16 DIAGNOSIS — M1990 Unspecified osteoarthritis, unspecified site: Secondary | ICD-10-CM | POA: Diagnosis not present

## 2020-03-16 DIAGNOSIS — N183 Chronic kidney disease, stage 3 unspecified: Secondary | ICD-10-CM | POA: Diagnosis not present

## 2020-03-16 DIAGNOSIS — Z48815 Encounter for surgical aftercare following surgery on the digestive system: Secondary | ICD-10-CM | POA: Diagnosis not present

## 2020-03-16 DIAGNOSIS — J42 Unspecified chronic bronchitis: Secondary | ICD-10-CM | POA: Diagnosis not present

## 2020-03-16 DIAGNOSIS — G629 Polyneuropathy, unspecified: Secondary | ICD-10-CM | POA: Diagnosis not present

## 2020-03-16 DIAGNOSIS — I1310 Hypertensive heart and chronic kidney disease without heart failure, with stage 1 through stage 4 chronic kidney disease, or unspecified chronic kidney disease: Secondary | ICD-10-CM | POA: Diagnosis not present

## 2020-03-22 DIAGNOSIS — F329 Major depressive disorder, single episode, unspecified: Secondary | ICD-10-CM | POA: Diagnosis not present

## 2020-03-22 DIAGNOSIS — Z8616 Personal history of COVID-19: Secondary | ICD-10-CM | POA: Diagnosis not present

## 2020-03-22 DIAGNOSIS — N135 Crossing vessel and stricture of ureter without hydronephrosis: Secondary | ICD-10-CM | POA: Diagnosis not present

## 2020-03-22 DIAGNOSIS — I1310 Hypertensive heart and chronic kidney disease without heart failure, with stage 1 through stage 4 chronic kidney disease, or unspecified chronic kidney disease: Secondary | ICD-10-CM | POA: Diagnosis not present

## 2020-03-22 DIAGNOSIS — Z79891 Long term (current) use of opiate analgesic: Secondary | ICD-10-CM | POA: Diagnosis not present

## 2020-03-22 DIAGNOSIS — Z87440 Personal history of urinary (tract) infections: Secondary | ICD-10-CM | POA: Diagnosis not present

## 2020-03-22 DIAGNOSIS — F419 Anxiety disorder, unspecified: Secondary | ICD-10-CM | POA: Diagnosis not present

## 2020-03-22 DIAGNOSIS — N131 Hydronephrosis with ureteral stricture, not elsewhere classified: Secondary | ICD-10-CM | POA: Diagnosis not present

## 2020-03-22 DIAGNOSIS — Z960 Presence of urogenital implants: Secondary | ICD-10-CM | POA: Diagnosis not present

## 2020-03-22 DIAGNOSIS — Z9089 Acquired absence of other organs: Secondary | ICD-10-CM | POA: Diagnosis not present

## 2020-03-22 DIAGNOSIS — Z9181 History of falling: Secondary | ICD-10-CM | POA: Diagnosis not present

## 2020-03-22 DIAGNOSIS — N3941 Urge incontinence: Secondary | ICD-10-CM | POA: Diagnosis not present

## 2020-03-22 DIAGNOSIS — H9193 Unspecified hearing loss, bilateral: Secondary | ICD-10-CM | POA: Diagnosis not present

## 2020-03-22 DIAGNOSIS — N183 Chronic kidney disease, stage 3 unspecified: Secondary | ICD-10-CM | POA: Diagnosis not present

## 2020-03-22 DIAGNOSIS — Z9049 Acquired absence of other specified parts of digestive tract: Secondary | ICD-10-CM | POA: Diagnosis not present

## 2020-03-22 DIAGNOSIS — Z7982 Long term (current) use of aspirin: Secondary | ICD-10-CM | POA: Diagnosis not present

## 2020-03-22 DIAGNOSIS — I082 Rheumatic disorders of both aortic and tricuspid valves: Secondary | ICD-10-CM | POA: Diagnosis not present

## 2020-03-22 DIAGNOSIS — Z87891 Personal history of nicotine dependence: Secondary | ICD-10-CM | POA: Diagnosis not present

## 2020-03-22 DIAGNOSIS — Z993 Dependence on wheelchair: Secondary | ICD-10-CM | POA: Diagnosis not present

## 2020-03-22 DIAGNOSIS — D492 Neoplasm of unspecified behavior of bone, soft tissue, and skin: Secondary | ICD-10-CM | POA: Diagnosis not present

## 2020-03-22 DIAGNOSIS — J42 Unspecified chronic bronchitis: Secondary | ICD-10-CM | POA: Diagnosis not present

## 2020-03-22 DIAGNOSIS — Z48815 Encounter for surgical aftercare following surgery on the digestive system: Secondary | ICD-10-CM | POA: Diagnosis not present

## 2020-03-22 DIAGNOSIS — R972 Elevated prostate specific antigen [PSA]: Secondary | ICD-10-CM | POA: Diagnosis not present

## 2020-03-22 DIAGNOSIS — G629 Polyneuropathy, unspecified: Secondary | ICD-10-CM | POA: Diagnosis not present

## 2020-03-22 DIAGNOSIS — M1990 Unspecified osteoarthritis, unspecified site: Secondary | ICD-10-CM | POA: Diagnosis not present

## 2020-03-22 DIAGNOSIS — M4800 Spinal stenosis, site unspecified: Secondary | ICD-10-CM | POA: Diagnosis not present

## 2020-03-31 DIAGNOSIS — M1990 Unspecified osteoarthritis, unspecified site: Secondary | ICD-10-CM | POA: Diagnosis not present

## 2020-03-31 DIAGNOSIS — J42 Unspecified chronic bronchitis: Secondary | ICD-10-CM | POA: Diagnosis not present

## 2020-03-31 DIAGNOSIS — G629 Polyneuropathy, unspecified: Secondary | ICD-10-CM | POA: Diagnosis not present

## 2020-03-31 DIAGNOSIS — Z48815 Encounter for surgical aftercare following surgery on the digestive system: Secondary | ICD-10-CM | POA: Diagnosis not present

## 2020-03-31 DIAGNOSIS — I1310 Hypertensive heart and chronic kidney disease without heart failure, with stage 1 through stage 4 chronic kidney disease, or unspecified chronic kidney disease: Secondary | ICD-10-CM | POA: Diagnosis not present

## 2020-03-31 DIAGNOSIS — N183 Chronic kidney disease, stage 3 unspecified: Secondary | ICD-10-CM | POA: Diagnosis not present

## 2020-04-06 DIAGNOSIS — G629 Polyneuropathy, unspecified: Secondary | ICD-10-CM | POA: Diagnosis not present

## 2020-04-06 DIAGNOSIS — J42 Unspecified chronic bronchitis: Secondary | ICD-10-CM | POA: Diagnosis not present

## 2020-04-06 DIAGNOSIS — N183 Chronic kidney disease, stage 3 unspecified: Secondary | ICD-10-CM | POA: Diagnosis not present

## 2020-04-06 DIAGNOSIS — I1310 Hypertensive heart and chronic kidney disease without heart failure, with stage 1 through stage 4 chronic kidney disease, or unspecified chronic kidney disease: Secondary | ICD-10-CM | POA: Diagnosis not present

## 2020-04-06 DIAGNOSIS — Z48815 Encounter for surgical aftercare following surgery on the digestive system: Secondary | ICD-10-CM | POA: Diagnosis not present

## 2020-04-06 DIAGNOSIS — M1990 Unspecified osteoarthritis, unspecified site: Secondary | ICD-10-CM | POA: Diagnosis not present

## 2020-04-16 DIAGNOSIS — I1310 Hypertensive heart and chronic kidney disease without heart failure, with stage 1 through stage 4 chronic kidney disease, or unspecified chronic kidney disease: Secondary | ICD-10-CM | POA: Diagnosis not present

## 2020-04-16 DIAGNOSIS — M1990 Unspecified osteoarthritis, unspecified site: Secondary | ICD-10-CM | POA: Diagnosis not present

## 2020-04-16 DIAGNOSIS — Z48815 Encounter for surgical aftercare following surgery on the digestive system: Secondary | ICD-10-CM | POA: Diagnosis not present

## 2020-04-16 DIAGNOSIS — N183 Chronic kidney disease, stage 3 unspecified: Secondary | ICD-10-CM | POA: Diagnosis not present

## 2020-04-16 DIAGNOSIS — G629 Polyneuropathy, unspecified: Secondary | ICD-10-CM | POA: Diagnosis not present

## 2020-04-16 DIAGNOSIS — J42 Unspecified chronic bronchitis: Secondary | ICD-10-CM | POA: Diagnosis not present

## 2020-04-20 DIAGNOSIS — N183 Chronic kidney disease, stage 3 unspecified: Secondary | ICD-10-CM | POA: Diagnosis not present

## 2020-04-20 DIAGNOSIS — I1310 Hypertensive heart and chronic kidney disease without heart failure, with stage 1 through stage 4 chronic kidney disease, or unspecified chronic kidney disease: Secondary | ICD-10-CM | POA: Diagnosis not present

## 2020-04-20 DIAGNOSIS — J42 Unspecified chronic bronchitis: Secondary | ICD-10-CM | POA: Diagnosis not present

## 2020-04-20 DIAGNOSIS — G629 Polyneuropathy, unspecified: Secondary | ICD-10-CM | POA: Diagnosis not present

## 2020-04-20 DIAGNOSIS — Z48815 Encounter for surgical aftercare following surgery on the digestive system: Secondary | ICD-10-CM | POA: Diagnosis not present

## 2020-04-20 DIAGNOSIS — M1990 Unspecified osteoarthritis, unspecified site: Secondary | ICD-10-CM | POA: Diagnosis not present

## 2020-04-21 DIAGNOSIS — Z9181 History of falling: Secondary | ICD-10-CM | POA: Diagnosis not present

## 2020-04-21 DIAGNOSIS — Z9049 Acquired absence of other specified parts of digestive tract: Secondary | ICD-10-CM | POA: Diagnosis not present

## 2020-04-21 DIAGNOSIS — Z993 Dependence on wheelchair: Secondary | ICD-10-CM | POA: Diagnosis not present

## 2020-04-21 DIAGNOSIS — Z9089 Acquired absence of other organs: Secondary | ICD-10-CM | POA: Diagnosis not present

## 2020-04-21 DIAGNOSIS — Z79891 Long term (current) use of opiate analgesic: Secondary | ICD-10-CM | POA: Diagnosis not present

## 2020-04-21 DIAGNOSIS — J42 Unspecified chronic bronchitis: Secondary | ICD-10-CM | POA: Diagnosis not present

## 2020-04-21 DIAGNOSIS — Z7982 Long term (current) use of aspirin: Secondary | ICD-10-CM | POA: Diagnosis not present

## 2020-04-21 DIAGNOSIS — M1990 Unspecified osteoarthritis, unspecified site: Secondary | ICD-10-CM | POA: Diagnosis not present

## 2020-04-21 DIAGNOSIS — Z87891 Personal history of nicotine dependence: Secondary | ICD-10-CM | POA: Diagnosis not present

## 2020-04-21 DIAGNOSIS — I082 Rheumatic disorders of both aortic and tricuspid valves: Secondary | ICD-10-CM | POA: Diagnosis not present

## 2020-04-21 DIAGNOSIS — N131 Hydronephrosis with ureteral stricture, not elsewhere classified: Secondary | ICD-10-CM | POA: Diagnosis not present

## 2020-04-21 DIAGNOSIS — Z8616 Personal history of COVID-19: Secondary | ICD-10-CM | POA: Diagnosis not present

## 2020-04-21 DIAGNOSIS — G629 Polyneuropathy, unspecified: Secondary | ICD-10-CM | POA: Diagnosis not present

## 2020-04-21 DIAGNOSIS — Z48815 Encounter for surgical aftercare following surgery on the digestive system: Secondary | ICD-10-CM | POA: Diagnosis not present

## 2020-04-21 DIAGNOSIS — M4800 Spinal stenosis, site unspecified: Secondary | ICD-10-CM | POA: Diagnosis not present

## 2020-04-21 DIAGNOSIS — F419 Anxiety disorder, unspecified: Secondary | ICD-10-CM | POA: Diagnosis not present

## 2020-04-21 DIAGNOSIS — Z87440 Personal history of urinary (tract) infections: Secondary | ICD-10-CM | POA: Diagnosis not present

## 2020-04-21 DIAGNOSIS — N183 Chronic kidney disease, stage 3 unspecified: Secondary | ICD-10-CM | POA: Diagnosis not present

## 2020-04-21 DIAGNOSIS — I1310 Hypertensive heart and chronic kidney disease without heart failure, with stage 1 through stage 4 chronic kidney disease, or unspecified chronic kidney disease: Secondary | ICD-10-CM | POA: Diagnosis not present

## 2020-04-21 DIAGNOSIS — Z960 Presence of urogenital implants: Secondary | ICD-10-CM | POA: Diagnosis not present

## 2020-04-21 DIAGNOSIS — F329 Major depressive disorder, single episode, unspecified: Secondary | ICD-10-CM | POA: Diagnosis not present

## 2020-04-21 DIAGNOSIS — H9193 Unspecified hearing loss, bilateral: Secondary | ICD-10-CM | POA: Diagnosis not present

## 2020-04-22 DIAGNOSIS — Z8616 Personal history of COVID-19: Secondary | ICD-10-CM | POA: Diagnosis not present

## 2020-04-22 DIAGNOSIS — Z1152 Encounter for screening for COVID-19: Secondary | ICD-10-CM | POA: Diagnosis not present

## 2020-04-22 DIAGNOSIS — J189 Pneumonia, unspecified organism: Secondary | ICD-10-CM | POA: Diagnosis not present

## 2020-04-22 DIAGNOSIS — R05 Cough: Secondary | ICD-10-CM | POA: Diagnosis not present

## 2020-04-22 DIAGNOSIS — Z20822 Contact with and (suspected) exposure to covid-19: Secondary | ICD-10-CM | POA: Diagnosis not present

## 2020-04-26 DIAGNOSIS — Z8616 Personal history of COVID-19: Secondary | ICD-10-CM | POA: Diagnosis not present

## 2020-04-26 DIAGNOSIS — I1310 Hypertensive heart and chronic kidney disease without heart failure, with stage 1 through stage 4 chronic kidney disease, or unspecified chronic kidney disease: Secondary | ICD-10-CM | POA: Diagnosis not present

## 2020-04-26 DIAGNOSIS — N183 Chronic kidney disease, stage 3 unspecified: Secondary | ICD-10-CM | POA: Diagnosis not present

## 2020-04-26 DIAGNOSIS — G629 Polyneuropathy, unspecified: Secondary | ICD-10-CM | POA: Diagnosis not present

## 2020-04-26 DIAGNOSIS — J42 Unspecified chronic bronchitis: Secondary | ICD-10-CM | POA: Diagnosis not present

## 2020-04-26 DIAGNOSIS — Z48815 Encounter for surgical aftercare following surgery on the digestive system: Secondary | ICD-10-CM | POA: Diagnosis not present

## 2020-05-04 DIAGNOSIS — J42 Unspecified chronic bronchitis: Secondary | ICD-10-CM | POA: Diagnosis not present

## 2020-05-04 DIAGNOSIS — N183 Chronic kidney disease, stage 3 unspecified: Secondary | ICD-10-CM | POA: Diagnosis not present

## 2020-05-04 DIAGNOSIS — Z48815 Encounter for surgical aftercare following surgery on the digestive system: Secondary | ICD-10-CM | POA: Diagnosis not present

## 2020-05-04 DIAGNOSIS — G629 Polyneuropathy, unspecified: Secondary | ICD-10-CM | POA: Diagnosis not present

## 2020-05-04 DIAGNOSIS — Z8616 Personal history of COVID-19: Secondary | ICD-10-CM | POA: Diagnosis not present

## 2020-05-04 DIAGNOSIS — I1310 Hypertensive heart and chronic kidney disease without heart failure, with stage 1 through stage 4 chronic kidney disease, or unspecified chronic kidney disease: Secondary | ICD-10-CM | POA: Diagnosis not present

## 2020-05-10 DIAGNOSIS — C61 Malignant neoplasm of prostate: Secondary | ICD-10-CM | POA: Diagnosis not present

## 2020-05-10 DIAGNOSIS — R972 Elevated prostate specific antigen [PSA]: Secondary | ICD-10-CM | POA: Diagnosis not present

## 2020-05-19 DIAGNOSIS — G629 Polyneuropathy, unspecified: Secondary | ICD-10-CM | POA: Diagnosis not present

## 2020-05-19 DIAGNOSIS — N183 Chronic kidney disease, stage 3 unspecified: Secondary | ICD-10-CM | POA: Diagnosis not present

## 2020-05-19 DIAGNOSIS — J42 Unspecified chronic bronchitis: Secondary | ICD-10-CM | POA: Diagnosis not present

## 2020-05-19 DIAGNOSIS — I1310 Hypertensive heart and chronic kidney disease without heart failure, with stage 1 through stage 4 chronic kidney disease, or unspecified chronic kidney disease: Secondary | ICD-10-CM | POA: Diagnosis not present

## 2020-05-19 DIAGNOSIS — Z48815 Encounter for surgical aftercare following surgery on the digestive system: Secondary | ICD-10-CM | POA: Diagnosis not present

## 2020-05-19 DIAGNOSIS — Z8616 Personal history of COVID-19: Secondary | ICD-10-CM | POA: Diagnosis not present

## 2020-05-21 DIAGNOSIS — Z960 Presence of urogenital implants: Secondary | ICD-10-CM | POA: Diagnosis not present

## 2020-05-21 DIAGNOSIS — J42 Unspecified chronic bronchitis: Secondary | ICD-10-CM | POA: Diagnosis not present

## 2020-05-21 DIAGNOSIS — N183 Chronic kidney disease, stage 3 unspecified: Secondary | ICD-10-CM | POA: Diagnosis not present

## 2020-05-21 DIAGNOSIS — F329 Major depressive disorder, single episode, unspecified: Secondary | ICD-10-CM | POA: Diagnosis not present

## 2020-05-21 DIAGNOSIS — Z9049 Acquired absence of other specified parts of digestive tract: Secondary | ICD-10-CM | POA: Diagnosis not present

## 2020-05-21 DIAGNOSIS — I082 Rheumatic disorders of both aortic and tricuspid valves: Secondary | ICD-10-CM | POA: Diagnosis not present

## 2020-05-21 DIAGNOSIS — Z7982 Long term (current) use of aspirin: Secondary | ICD-10-CM | POA: Diagnosis not present

## 2020-05-21 DIAGNOSIS — N131 Hydronephrosis with ureteral stricture, not elsewhere classified: Secondary | ICD-10-CM | POA: Diagnosis not present

## 2020-05-21 DIAGNOSIS — M1990 Unspecified osteoarthritis, unspecified site: Secondary | ICD-10-CM | POA: Diagnosis not present

## 2020-05-21 DIAGNOSIS — Z9181 History of falling: Secondary | ICD-10-CM | POA: Diagnosis not present

## 2020-05-21 DIAGNOSIS — Z48815 Encounter for surgical aftercare following surgery on the digestive system: Secondary | ICD-10-CM | POA: Diagnosis not present

## 2020-05-21 DIAGNOSIS — Z87891 Personal history of nicotine dependence: Secondary | ICD-10-CM | POA: Diagnosis not present

## 2020-05-21 DIAGNOSIS — F419 Anxiety disorder, unspecified: Secondary | ICD-10-CM | POA: Diagnosis not present

## 2020-05-21 DIAGNOSIS — Z9089 Acquired absence of other organs: Secondary | ICD-10-CM | POA: Diagnosis not present

## 2020-05-21 DIAGNOSIS — I1310 Hypertensive heart and chronic kidney disease without heart failure, with stage 1 through stage 4 chronic kidney disease, or unspecified chronic kidney disease: Secondary | ICD-10-CM | POA: Diagnosis not present

## 2020-05-21 DIAGNOSIS — M4800 Spinal stenosis, site unspecified: Secondary | ICD-10-CM | POA: Diagnosis not present

## 2020-05-21 DIAGNOSIS — H9193 Unspecified hearing loss, bilateral: Secondary | ICD-10-CM | POA: Diagnosis not present

## 2020-05-21 DIAGNOSIS — Z79891 Long term (current) use of opiate analgesic: Secondary | ICD-10-CM | POA: Diagnosis not present

## 2020-05-21 DIAGNOSIS — Z87440 Personal history of urinary (tract) infections: Secondary | ICD-10-CM | POA: Diagnosis not present

## 2020-05-21 DIAGNOSIS — G629 Polyneuropathy, unspecified: Secondary | ICD-10-CM | POA: Diagnosis not present

## 2020-05-21 DIAGNOSIS — Z8616 Personal history of COVID-19: Secondary | ICD-10-CM | POA: Diagnosis not present

## 2020-05-25 DIAGNOSIS — N3941 Urge incontinence: Secondary | ICD-10-CM | POA: Diagnosis not present

## 2020-05-25 DIAGNOSIS — C61 Malignant neoplasm of prostate: Secondary | ICD-10-CM | POA: Diagnosis not present

## 2020-05-25 DIAGNOSIS — C7951 Secondary malignant neoplasm of bone: Secondary | ICD-10-CM | POA: Diagnosis not present

## 2020-05-25 DIAGNOSIS — N135 Crossing vessel and stricture of ureter without hydronephrosis: Secondary | ICD-10-CM | POA: Diagnosis not present

## 2020-05-27 ENCOUNTER — Other Ambulatory Visit (HOSPITAL_COMMUNITY): Payer: Self-pay | Admitting: Urology

## 2020-05-27 DIAGNOSIS — C61 Malignant neoplasm of prostate: Secondary | ICD-10-CM

## 2020-05-27 DIAGNOSIS — C7951 Secondary malignant neoplasm of bone: Secondary | ICD-10-CM

## 2020-05-31 DIAGNOSIS — Z8616 Personal history of COVID-19: Secondary | ICD-10-CM | POA: Diagnosis not present

## 2020-05-31 DIAGNOSIS — J42 Unspecified chronic bronchitis: Secondary | ICD-10-CM | POA: Diagnosis not present

## 2020-05-31 DIAGNOSIS — N183 Chronic kidney disease, stage 3 unspecified: Secondary | ICD-10-CM | POA: Diagnosis not present

## 2020-05-31 DIAGNOSIS — I1310 Hypertensive heart and chronic kidney disease without heart failure, with stage 1 through stage 4 chronic kidney disease, or unspecified chronic kidney disease: Secondary | ICD-10-CM | POA: Diagnosis not present

## 2020-05-31 DIAGNOSIS — Z48815 Encounter for surgical aftercare following surgery on the digestive system: Secondary | ICD-10-CM | POA: Diagnosis not present

## 2020-05-31 DIAGNOSIS — G629 Polyneuropathy, unspecified: Secondary | ICD-10-CM | POA: Diagnosis not present

## 2020-06-02 ENCOUNTER — Encounter (HOSPITAL_COMMUNITY): Payer: Self-pay

## 2020-06-02 ENCOUNTER — Encounter (HOSPITAL_COMMUNITY): Payer: Medicare Other

## 2020-06-02 ENCOUNTER — Encounter (HOSPITAL_COMMUNITY): Admission: RE | Admit: 2020-06-02 | Payer: Medicare Other | Source: Ambulatory Visit

## 2020-06-08 DIAGNOSIS — Z5111 Encounter for antineoplastic chemotherapy: Secondary | ICD-10-CM | POA: Diagnosis not present

## 2020-06-08 DIAGNOSIS — C61 Malignant neoplasm of prostate: Secondary | ICD-10-CM | POA: Diagnosis not present

## 2020-06-16 DIAGNOSIS — Z48815 Encounter for surgical aftercare following surgery on the digestive system: Secondary | ICD-10-CM | POA: Diagnosis not present

## 2020-06-16 DIAGNOSIS — I1310 Hypertensive heart and chronic kidney disease without heart failure, with stage 1 through stage 4 chronic kidney disease, or unspecified chronic kidney disease: Secondary | ICD-10-CM | POA: Diagnosis not present

## 2020-06-16 DIAGNOSIS — G629 Polyneuropathy, unspecified: Secondary | ICD-10-CM | POA: Diagnosis not present

## 2020-06-16 DIAGNOSIS — Z8616 Personal history of COVID-19: Secondary | ICD-10-CM | POA: Diagnosis not present

## 2020-06-16 DIAGNOSIS — N183 Chronic kidney disease, stage 3 unspecified: Secondary | ICD-10-CM | POA: Diagnosis not present

## 2020-06-16 DIAGNOSIS — J42 Unspecified chronic bronchitis: Secondary | ICD-10-CM | POA: Diagnosis not present

## 2020-06-22 DIAGNOSIS — C61 Malignant neoplasm of prostate: Secondary | ICD-10-CM | POA: Diagnosis not present

## 2020-06-22 DIAGNOSIS — C7951 Secondary malignant neoplasm of bone: Secondary | ICD-10-CM | POA: Diagnosis not present

## 2020-06-22 DIAGNOSIS — N289 Disorder of kidney and ureter, unspecified: Secondary | ICD-10-CM | POA: Diagnosis not present

## 2020-06-22 DIAGNOSIS — M8580 Other specified disorders of bone density and structure, unspecified site: Secondary | ICD-10-CM | POA: Diagnosis not present

## 2020-06-22 DIAGNOSIS — N1832 Chronic kidney disease, stage 3b: Secondary | ICD-10-CM | POA: Diagnosis not present

## 2020-06-22 DIAGNOSIS — N1330 Unspecified hydronephrosis: Secondary | ICD-10-CM | POA: Diagnosis not present

## 2020-06-22 DIAGNOSIS — Z8616 Personal history of COVID-19: Secondary | ICD-10-CM | POA: Diagnosis not present

## 2020-06-22 DIAGNOSIS — E871 Hypo-osmolality and hyponatremia: Secondary | ICD-10-CM | POA: Diagnosis not present

## 2020-06-22 DIAGNOSIS — J42 Unspecified chronic bronchitis: Secondary | ICD-10-CM | POA: Diagnosis not present

## 2020-06-22 DIAGNOSIS — R2689 Other abnormalities of gait and mobility: Secondary | ICD-10-CM | POA: Diagnosis not present

## 2020-06-22 DIAGNOSIS — N401 Enlarged prostate with lower urinary tract symptoms: Secondary | ICD-10-CM | POA: Diagnosis not present

## 2020-06-22 DIAGNOSIS — I129 Hypertensive chronic kidney disease with stage 1 through stage 4 chronic kidney disease, or unspecified chronic kidney disease: Secondary | ICD-10-CM | POA: Diagnosis not present

## 2020-06-24 ENCOUNTER — Ambulatory Visit (HOSPITAL_COMMUNITY)
Admission: RE | Admit: 2020-06-24 | Discharge: 2020-06-24 | Disposition: A | Discharge status: Home / Self Care | Payer: Medicare Other | Source: Ambulatory Visit | Attending: Urology | Admitting: Urology

## 2020-06-24 ENCOUNTER — Other Ambulatory Visit: Payer: Self-pay

## 2020-06-24 DIAGNOSIS — C61 Malignant neoplasm of prostate: Secondary | ICD-10-CM | POA: Diagnosis not present

## 2020-06-24 DIAGNOSIS — C7951 Secondary malignant neoplasm of bone: Secondary | ICD-10-CM | POA: Insufficient documentation

## 2020-06-24 DIAGNOSIS — M1611 Unilateral primary osteoarthritis, right hip: Secondary | ICD-10-CM | POA: Diagnosis not present

## 2020-06-24 DIAGNOSIS — M1711 Unilateral primary osteoarthritis, right knee: Secondary | ICD-10-CM | POA: Diagnosis not present

## 2020-06-24 MED ORDER — TECHNETIUM TC 99M MEDRONATE IV KIT
20.9000 | PACK | Freq: Once | INTRAVENOUS | Status: AC
  Administered 2020-06-24: 20.9 via INTRAVENOUS

## 2020-06-28 DIAGNOSIS — N1832 Chronic kidney disease, stage 3b: Secondary | ICD-10-CM | POA: Diagnosis not present

## 2020-08-04 DIAGNOSIS — Z23 Encounter for immunization: Secondary | ICD-10-CM | POA: Diagnosis not present

## 2020-08-24 DIAGNOSIS — C61 Malignant neoplasm of prostate: Secondary | ICD-10-CM | POA: Diagnosis not present

## 2020-09-14 DIAGNOSIS — N135 Crossing vessel and stricture of ureter without hydronephrosis: Secondary | ICD-10-CM | POA: Diagnosis not present

## 2020-09-14 DIAGNOSIS — C61 Malignant neoplasm of prostate: Secondary | ICD-10-CM | POA: Diagnosis not present

## 2020-09-14 DIAGNOSIS — C7951 Secondary malignant neoplasm of bone: Secondary | ICD-10-CM | POA: Diagnosis not present

## 2020-09-14 DIAGNOSIS — N3941 Urge incontinence: Secondary | ICD-10-CM | POA: Diagnosis not present

## 2020-10-01 DIAGNOSIS — Z125 Encounter for screening for malignant neoplasm of prostate: Secondary | ICD-10-CM | POA: Diagnosis not present

## 2020-10-01 DIAGNOSIS — N1832 Chronic kidney disease, stage 3b: Secondary | ICD-10-CM | POA: Diagnosis not present

## 2020-10-01 DIAGNOSIS — E538 Deficiency of other specified B group vitamins: Secondary | ICD-10-CM | POA: Diagnosis not present

## 2020-10-01 DIAGNOSIS — R739 Hyperglycemia, unspecified: Secondary | ICD-10-CM | POA: Diagnosis not present

## 2020-10-01 DIAGNOSIS — I129 Hypertensive chronic kidney disease with stage 1 through stage 4 chronic kidney disease, or unspecified chronic kidney disease: Secondary | ICD-10-CM | POA: Diagnosis not present

## 2020-10-01 DIAGNOSIS — M859 Disorder of bone density and structure, unspecified: Secondary | ICD-10-CM | POA: Diagnosis not present

## 2020-10-05 ENCOUNTER — Other Ambulatory Visit: Payer: Self-pay

## 2020-10-05 ENCOUNTER — Encounter (HOSPITAL_COMMUNITY): Payer: Self-pay

## 2020-10-05 ENCOUNTER — Emergency Department (HOSPITAL_COMMUNITY): Payer: Medicare Other

## 2020-10-05 ENCOUNTER — Inpatient Hospital Stay (HOSPITAL_COMMUNITY)
Admission: EM | Admit: 2020-10-05 | Discharge: 2020-10-10 | DRG: 308 | Disposition: A | Discharge status: Home Health | Payer: Medicare Other | Attending: Family Medicine | Admitting: Family Medicine

## 2020-10-05 ENCOUNTER — Observation Stay (HOSPITAL_COMMUNITY): Payer: Medicare Other

## 2020-10-05 DIAGNOSIS — C61 Malignant neoplasm of prostate: Secondary | ICD-10-CM | POA: Diagnosis not present

## 2020-10-05 DIAGNOSIS — I10 Essential (primary) hypertension: Secondary | ICD-10-CM | POA: Diagnosis present

## 2020-10-05 DIAGNOSIS — J9811 Atelectasis: Secondary | ICD-10-CM | POA: Diagnosis not present

## 2020-10-05 DIAGNOSIS — R001 Bradycardia, unspecified: Secondary | ICD-10-CM | POA: Diagnosis not present

## 2020-10-05 DIAGNOSIS — Z8616 Personal history of COVID-19: Secondary | ICD-10-CM

## 2020-10-05 DIAGNOSIS — H9190 Unspecified hearing loss, unspecified ear: Secondary | ICD-10-CM | POA: Diagnosis present

## 2020-10-05 DIAGNOSIS — R404 Transient alteration of awareness: Secondary | ICD-10-CM | POA: Diagnosis not present

## 2020-10-05 DIAGNOSIS — Z7401 Bed confinement status: Secondary | ICD-10-CM

## 2020-10-05 DIAGNOSIS — C7951 Secondary malignant neoplasm of bone: Secondary | ICD-10-CM | POA: Diagnosis not present

## 2020-10-05 DIAGNOSIS — N1832 Chronic kidney disease, stage 3b: Secondary | ICD-10-CM | POA: Diagnosis not present

## 2020-10-05 DIAGNOSIS — J189 Pneumonia, unspecified organism: Secondary | ICD-10-CM

## 2020-10-05 DIAGNOSIS — Z7982 Long term (current) use of aspirin: Secondary | ICD-10-CM

## 2020-10-05 DIAGNOSIS — K219 Gastro-esophageal reflux disease without esophagitis: Secondary | ICD-10-CM | POA: Diagnosis present

## 2020-10-05 DIAGNOSIS — I441 Atrioventricular block, second degree: Secondary | ICD-10-CM | POA: Diagnosis not present

## 2020-10-05 DIAGNOSIS — R569 Unspecified convulsions: Secondary | ICD-10-CM | POA: Diagnosis not present

## 2020-10-05 DIAGNOSIS — Z8546 Personal history of malignant neoplasm of prostate: Secondary | ICD-10-CM

## 2020-10-05 DIAGNOSIS — Z87440 Personal history of urinary (tract) infections: Secondary | ICD-10-CM

## 2020-10-05 DIAGNOSIS — Z96642 Presence of left artificial hip joint: Secondary | ICD-10-CM | POA: Diagnosis present

## 2020-10-05 DIAGNOSIS — B965 Pseudomonas (aeruginosa) (mallei) (pseudomallei) as the cause of diseases classified elsewhere: Secondary | ICD-10-CM | POA: Diagnosis present

## 2020-10-05 DIAGNOSIS — R059 Cough, unspecified: Secondary | ICD-10-CM

## 2020-10-05 DIAGNOSIS — G40909 Epilepsy, unspecified, not intractable, without status epilepticus: Secondary | ICD-10-CM | POA: Diagnosis not present

## 2020-10-05 DIAGNOSIS — I082 Rheumatic disorders of both aortic and tricuspid valves: Secondary | ICD-10-CM | POA: Diagnosis present

## 2020-10-05 DIAGNOSIS — J42 Unspecified chronic bronchitis: Secondary | ICD-10-CM | POA: Diagnosis not present

## 2020-10-05 DIAGNOSIS — Z91030 Bee allergy status: Secondary | ICD-10-CM

## 2020-10-05 DIAGNOSIS — N179 Acute kidney failure, unspecified: Secondary | ICD-10-CM | POA: Diagnosis not present

## 2020-10-05 DIAGNOSIS — R456 Violent behavior: Secondary | ICD-10-CM | POA: Diagnosis not present

## 2020-10-05 DIAGNOSIS — G9341 Metabolic encephalopathy: Secondary | ICD-10-CM | POA: Diagnosis not present

## 2020-10-05 DIAGNOSIS — G629 Polyneuropathy, unspecified: Secondary | ICD-10-CM | POA: Diagnosis present

## 2020-10-05 DIAGNOSIS — E1122 Type 2 diabetes mellitus with diabetic chronic kidney disease: Secondary | ICD-10-CM | POA: Diagnosis present

## 2020-10-05 DIAGNOSIS — N390 Urinary tract infection, site not specified: Secondary | ICD-10-CM | POA: Diagnosis not present

## 2020-10-05 DIAGNOSIS — I499 Cardiac arrhythmia, unspecified: Secondary | ICD-10-CM | POA: Diagnosis present

## 2020-10-05 DIAGNOSIS — G319 Degenerative disease of nervous system, unspecified: Secondary | ICD-10-CM | POA: Diagnosis not present

## 2020-10-05 DIAGNOSIS — I129 Hypertensive chronic kidney disease with stage 1 through stage 4 chronic kidney disease, or unspecified chronic kidney disease: Secondary | ICD-10-CM | POA: Diagnosis present

## 2020-10-05 DIAGNOSIS — F05 Delirium due to known physiological condition: Secondary | ICD-10-CM | POA: Diagnosis not present

## 2020-10-05 DIAGNOSIS — I444 Left anterior fascicular block: Secondary | ICD-10-CM | POA: Diagnosis present

## 2020-10-05 DIAGNOSIS — I951 Orthostatic hypotension: Secondary | ICD-10-CM | POA: Diagnosis not present

## 2020-10-05 DIAGNOSIS — Z20822 Contact with and (suspected) exposure to covid-19: Secondary | ICD-10-CM | POA: Diagnosis not present

## 2020-10-05 DIAGNOSIS — E785 Hyperlipidemia, unspecified: Secondary | ICD-10-CM | POA: Diagnosis present

## 2020-10-05 DIAGNOSIS — R55 Syncope and collapse: Secondary | ICD-10-CM

## 2020-10-05 DIAGNOSIS — F40240 Claustrophobia: Secondary | ICD-10-CM | POA: Diagnosis present

## 2020-10-05 DIAGNOSIS — R410 Disorientation, unspecified: Secondary | ICD-10-CM | POA: Diagnosis not present

## 2020-10-05 DIAGNOSIS — M1990 Unspecified osteoarthritis, unspecified site: Secondary | ICD-10-CM | POA: Diagnosis present

## 2020-10-05 DIAGNOSIS — I4891 Unspecified atrial fibrillation: Secondary | ICD-10-CM | POA: Diagnosis not present

## 2020-10-05 DIAGNOSIS — Z79899 Other long term (current) drug therapy: Secondary | ICD-10-CM

## 2020-10-05 LAB — I-STAT CHEM 8, ED
BUN: 23 mg/dL (ref 8–23)
Calcium, Ion: 1.22 mmol/L (ref 1.15–1.40)
Chloride: 100 mmol/L (ref 98–111)
Creatinine, Ser: 1.7 mg/dL — ABNORMAL HIGH (ref 0.61–1.24)
Glucose, Bld: 102 mg/dL — ABNORMAL HIGH (ref 70–99)
HCT: 42 % (ref 39.0–52.0)
Hemoglobin: 14.3 g/dL (ref 13.0–17.0)
Potassium: 4.6 mmol/L (ref 3.5–5.1)
Sodium: 138 mmol/L (ref 135–145)
TCO2: 28 mmol/L (ref 22–32)

## 2020-10-05 LAB — URINALYSIS, ROUTINE W REFLEX MICROSCOPIC
Bilirubin Urine: NEGATIVE
Glucose, UA: NEGATIVE mg/dL
Hgb urine dipstick: NEGATIVE
Ketones, ur: NEGATIVE mg/dL
Nitrite: POSITIVE — AB
Protein, ur: NEGATIVE mg/dL
Specific Gravity, Urine: 1.008 (ref 1.005–1.030)
pH: 5 (ref 5.0–8.0)

## 2020-10-05 LAB — DIFFERENTIAL
Abs Immature Granulocytes: 0.04 10*3/uL (ref 0.00–0.07)
Basophils Absolute: 0.1 10*3/uL (ref 0.0–0.1)
Basophils Relative: 1 %
Eosinophils Absolute: 0.9 10*3/uL — ABNORMAL HIGH (ref 0.0–0.5)
Eosinophils Relative: 7 %
Immature Granulocytes: 0 %
Lymphocytes Relative: 24 %
Lymphs Abs: 3 10*3/uL (ref 0.7–4.0)
Monocytes Absolute: 0.8 10*3/uL (ref 0.1–1.0)
Monocytes Relative: 6 %
Neutro Abs: 7.7 10*3/uL (ref 1.7–7.7)
Neutrophils Relative %: 62 %

## 2020-10-05 LAB — COMPREHENSIVE METABOLIC PANEL
ALT: 11 U/L (ref 0–44)
AST: 31 U/L (ref 15–41)
Albumin: 3.5 g/dL (ref 3.5–5.0)
Alkaline Phosphatase: 79 U/L (ref 38–126)
Anion gap: 10 (ref 5–15)
BUN: 17 mg/dL (ref 8–23)
CO2: 24 mmol/L (ref 22–32)
Calcium: 9.6 mg/dL (ref 8.9–10.3)
Chloride: 101 mmol/L (ref 98–111)
Creatinine, Ser: 1.85 mg/dL — ABNORMAL HIGH (ref 0.61–1.24)
GFR, Estimated: 35 mL/min — ABNORMAL LOW (ref >60)
Glucose, Bld: 104 mg/dL — ABNORMAL HIGH (ref 70–99)
Potassium: 4.6 mmol/L (ref 3.5–5.1)
Sodium: 135 mmol/L (ref 135–145)
Total Bilirubin: 0.8 mg/dL (ref 0.3–1.2)
Total Protein: 7.1 g/dL (ref 6.5–8.1)

## 2020-10-05 LAB — RAPID URINE DRUG SCREEN, HOSP PERFORMED
Amphetamines: NOT DETECTED
Barbiturates: NOT DETECTED
Benzodiazepines: NOT DETECTED
Cocaine: NOT DETECTED
Opiates: NOT DETECTED
Tetrahydrocannabinol: NOT DETECTED

## 2020-10-05 LAB — CBC
HCT: 40.7 % (ref 39.0–52.0)
Hemoglobin: 13.3 g/dL (ref 13.0–17.0)
MCH: 28.5 pg (ref 26.0–34.0)
MCHC: 32.7 g/dL (ref 30.0–36.0)
MCV: 87.3 fL (ref 80.0–100.0)
Platelets: 201 10*3/uL (ref 150–400)
RBC: 4.66 MIL/uL (ref 4.22–5.81)
RDW: 14.7 % (ref 11.5–15.5)
WBC: 12.4 10*3/uL — ABNORMAL HIGH (ref 4.0–10.5)
nRBC: 0 % (ref 0.0–0.2)

## 2020-10-05 LAB — RESP PANEL BY RT-PCR (FLU A&B, COVID) ARPGX2
Influenza A by PCR: NEGATIVE
Influenza B by PCR: NEGATIVE
SARS Coronavirus 2 by RT PCR: NEGATIVE

## 2020-10-05 LAB — APTT: aPTT: 30 seconds (ref 24–36)

## 2020-10-05 LAB — PROTIME-INR
INR: 1 (ref 0.8–1.2)
Prothrombin Time: 12.8 seconds (ref 11.4–15.2)

## 2020-10-05 LAB — ETHANOL: Alcohol, Ethyl (B): <10 mg/dL (ref <10)

## 2020-10-05 MED ORDER — AMLODIPINE BESYLATE 10 MG PO TABS: 10.0000 mg | ORAL_TABLET | Freq: Every day | ORAL | Status: DC

## 2020-10-05 MED ORDER — PAROXETINE HCL 20 MG PO TABS
40.0000 mg | ORAL_TABLET | Freq: Every day | ORAL | Status: DC
Start: 2020-10-05 — End: 2020-10-10
  Administered 2020-10-05 – 2020-10-09 (×5): 40 mg via ORAL
  Filled 2020-10-05 (×6): qty 2

## 2020-10-05 MED ORDER — ALBUTEROL SULFATE (2.5 MG/3ML) 0.083% IN NEBU
2.5000 mg | INHALATION_SOLUTION | Freq: Four times a day (QID) | RESPIRATORY_TRACT | Status: DC | PRN
  Administered 2020-10-05 – 2020-10-10 (×5): 2.5 mg via RESPIRATORY_TRACT
  Filled 2020-10-05 (×5): qty 3

## 2020-10-05 MED ORDER — SENNOSIDES-DOCUSATE SODIUM 8.6-50 MG PO TABS: 2.0000 | ORAL_TABLET | Freq: Every evening | ORAL | Status: DC | PRN

## 2020-10-05 MED ORDER — TRAZODONE HCL 50 MG PO TABS
50.0000 mg | ORAL_TABLET | Freq: Every day | ORAL | Status: DC
  Administered 2020-10-05 – 2020-10-09 (×5): 50 mg via ORAL
  Filled 2020-10-05 (×5): qty 1

## 2020-10-05 MED ORDER — MECLIZINE HCL 12.5 MG PO TABS: 25.0000 mg | ORAL_TABLET | Freq: Every day | ORAL | Status: DC | PRN

## 2020-10-05 MED ORDER — ACETAMINOPHEN 650 MG RE SUPP: 650.0000 mg | Freq: Four times a day (QID) | RECTAL | Status: DC | PRN

## 2020-10-05 MED ORDER — ONDANSETRON HCL 4 MG PO TABS: 4.0000 mg | ORAL_TABLET | Freq: Four times a day (QID) | ORAL | Status: DC | PRN

## 2020-10-05 MED ORDER — LORAZEPAM 2 MG/ML IJ SOLN
1.0000 mg | Freq: Once | INTRAMUSCULAR | Status: AC | PRN
  Administered 2020-10-05: 1 mg via INTRAVENOUS
  Filled 2020-10-05: qty 1

## 2020-10-05 MED ORDER — GUAIFENESIN ER 600 MG PO TB12
1200.0000 mg | ORAL_TABLET | Freq: Two times a day (BID) | ORAL | Status: DC
  Administered 2020-10-05 – 2020-10-10 (×10): 1200 mg via ORAL
  Filled 2020-10-05 (×11): qty 2

## 2020-10-05 MED ORDER — ONDANSETRON HCL 4 MG/2ML IJ SOLN: 4.0000 mg | Freq: Four times a day (QID) | INTRAMUSCULAR | Status: DC | PRN

## 2020-10-05 MED ORDER — PSYLLIUM 95 % PO PACK
1.0000 | PACK | Freq: Every day | ORAL | Status: DC
  Administered 2020-10-06 – 2020-10-10 (×5): 1 via ORAL
  Filled 2020-10-05 (×5): qty 1

## 2020-10-05 MED ORDER — ASPIRIN EC 81 MG PO TBEC
81.0000 mg | DELAYED_RELEASE_TABLET | Freq: Every day | ORAL | Status: DC
Start: 2020-10-06 — End: 2020-10-10
  Administered 2020-10-06 – 2020-10-10 (×5): 81 mg via ORAL
  Filled 2020-10-05 (×5): qty 1

## 2020-10-05 MED ORDER — ENOXAPARIN SODIUM 40 MG/0.4ML ~~LOC~~ SOLN
40.0000 mg | SUBCUTANEOUS | Status: DC
  Administered 2020-10-05 – 2020-10-07 (×3): 40 mg via SUBCUTANEOUS
  Filled 2020-10-05 (×3): qty 0.4

## 2020-10-05 MED ORDER — LEVETIRACETAM 500 MG PO TABS
500.0000 mg | ORAL_TABLET | Freq: Two times a day (BID) | ORAL | Status: DC
  Administered 2020-10-05 – 2020-10-06 (×2): 500 mg via ORAL
  Filled 2020-10-05 (×2): qty 1

## 2020-10-05 MED ORDER — LEVETIRACETAM IN NACL 1000 MG/100ML IV SOLN
1000.0000 mg | Freq: Once | INTRAVENOUS | Status: AC
  Administered 2020-10-05: 1000 mg via INTRAVENOUS
  Filled 2020-10-05: qty 100

## 2020-10-05 MED ORDER — POLYETHYLENE GLYCOL 3350 17 G PO PACK
17.0000 g | PACK | Freq: Every day | ORAL | Status: DC | PRN
Start: 2020-10-05 — End: 2020-10-09

## 2020-10-05 MED ORDER — LACTATED RINGERS IV SOLN: INTRAVENOUS | Status: AC

## 2020-10-05 MED ORDER — HYPROMELLOSE (GONIOSCOPIC) 2.5 % OP SOLN
1.0000 [drp] | Freq: Every day | OPHTHALMIC | Status: DC | PRN
  Filled 2020-10-05: qty 15

## 2020-10-05 MED ORDER — SODIUM CHLORIDE 0.9 % IV BOLUS
1000.0000 mL | Freq: Once | INTRAVENOUS | Status: AC
  Administered 2020-10-05: 1000 mL via INTRAVENOUS

## 2020-10-05 MED ORDER — ACETAMINOPHEN 325 MG PO TABS
650.0000 mg | ORAL_TABLET | Freq: Four times a day (QID) | ORAL | Status: DC | PRN
  Administered 2020-10-10: 650 mg via ORAL
  Filled 2020-10-05: qty 2

## 2020-10-05 NOTE — H&P (Signed)
History and Physical    Steven Cain YWV:[id number] DOB: 04-26-1931 DOA: 10/05/2020  PCP: Shon Baton, MD  Patient coming from: Home via EMS   Chief Complaint:  Chief Complaint  Patient presents with  . Seizures     HPI:    87 year old male with past medical history of seizure disorder, chronic kidney disease stage IIIb, hypertension, chronic bronchitis, gastroesophageal reflux disease, metastatic prostate cancer to bone who presents to [facility] emergency department via EMS after experiencing an episode of seizure activity.  Majority the history is from obtained from family who is at the bedside.  Is currently a poor historian due to lethargy due to recently administered Ativan.  According to family,, this morning, shortly after awakening the patient attempted to get up out of bed to go to the bathroom.  Has the patient was rising out of bed he suddenly experienced an episode of seizure activity.  Initially, the patient was simply staring at the wall followed by tensing of the muscles and a brief period of what sounds to be tonic-clonic activity.  The episode lasted sounds to be approximately 1 minute and during this episode patient had difficult to palpate pulses and was also exhibiting extremely shallow breathing.  The episode was witnessed by the daughter who is a PA that works at this facility.  Patient was extremely lethargic after the witnessed episode.  Patient did not experience any tongue biting self urination or self defecation.  EMS was contacted and the patient was then brought into [facility] emergency department for evaluation.  Upon evaluation in the emergency department patient exhibited no seizure activity.  Due to patient's history of prostate malignancy 1 mg of Ativan was administered and MRI brain was performed which revealed no evidence of metastatic disease or acute stroke.  Case was discussed with Dr. Malen Gauze with neurology who recommended  initiation of intravenous Keppra.  1 g of intravenous Keppra was administered.  Patient was additionally given 1 L of isotonic fluids.  The hospitalist group was then called to assess patient for admission to the hospital.  Review of Systems:   Review of Systems  Unable to perform ROS: Mental acuity    Past Medical History:  Diagnosis Date  . Abnormal EKG    hx of left anterior fasicular block on ekg, no cardiologist  . Anxiety   . AR (aortic regurgitation) 03/23/2017   Mild, Noted on ECHO  . Arthritis    DJD  . Benign enlargement of prostate    frequent UTI, trim of prostate done , with some issues of incontinence now.  . Chronic bronchitis ([place])    chronic- usually has phelgm often  . COVID-19 08/2019   fatigue and loss of taste and smell x 10 days did monoclonal antibody therapy  . Depression   . Dyspnea    with excertion  . Fluid retention in legs    resolved  . GERD (gastroesophageal reflux disease)    rare  . GI bleed   . Grade I diastolic dysfunction   . Hard of hearing    hear more on left side- no hearing aid  . History of colon polyps   . History of transfusion 2016  . Hydronephrosis 2018   Left ckd stage 3 no nephrologist per wife [place]  . Hypertension   . Moderate concentric left ventricular hypertrophy 03/23/2017   Noted on ECHO  . Perforated, severe stomach ulcer ([place]) 1974  . Peripheral neuropathy    feet  and toes  . Spinal stenosis   . TR (tricuspid regurgitation) 03/23/2017   Mild, Noted on ECHO  . Transfusion history    none recent  . Ureteral stricture    CHRONIC  . Urinary incontinence   . UTI (urinary tract infection)    frequent  . Wears glasses     Past Surgical History:  Procedure Laterality Date  . ABDOMINAL SURGERY     bilroths tube '74  . COLONOSCOPY N/A 09/29/2014   Procedure: COLONOSCOPY;  Surgeon: Beryle Beams, MD;  Location: [facility];  Service: Endoscopy;  Laterality: N/A;  . CYSTOSCOPY W/ URETERAL STENT PLACEMENT Left  05/06/2014   Procedure: CYSTOSCOPY WITH RETROGRADE PYELOGRAM/URETERAL STENT PLACEMENT;  Surgeon: [name], MD;  Location: [facility];  Service: Urology;  Laterality: Left;  . CYSTOSCOPY W/ URETERAL STENT PLACEMENT Left 03/31/2015   Procedure: CYSTOSCOPY WITH LEFT RETROGRADE PYELOGRAM/URETERAL STENT EXCHANGE;  Surgeon: [name], MD;  Location: [facility];  Service: Urology;  Laterality: Left;  . CYSTOSCOPY W/ URETERAL STENT PLACEMENT Left 09/15/2015   Procedure: CYSTOSCOPY WITH LEFT RETROGRADE PYELOGRAM/URETERAL STENT EXCHANGE ;  Surgeon: [name], MD;  Location: [facility];  Service: Urology;  Laterality: Left;  . CYSTOSCOPY W/ URETERAL STENT PLACEMENT Left 04/28/2016   Procedure: CYSTOSCOPY WITH RETROGRADE PYELOGRAM/URETERAL STENT EXCHANGE;  Surgeon: [name], MD;  [facility]: [facility];  Service: Urology;  Laterality: Left;  . CYSTOSCOPY W/ URETERAL STENT PLACEMENT Left 11/08/2016   Procedure: CYSTOSCOPY WITH RETROGRADE PYELOGRAM/URETERAL STENT EXCHANGE;  Surgeon: [name], MD;  [facility];  Service: Urology;  Laterality: Left;  . CYSTOSCOPY W/ URETERAL STENT PLACEMENT Left 09/14/2017   Procedure: CYSTOSCOPY WITH RETROGRADE PYELOGRAM/URETERAL STENT PLACEMENT;  Surgeon: [name], MD;  [facility];  Service: Urology;  Laterality: Left;  . CYSTOSCOPY W/ URETERAL STENT PLACEMENT Left 04/10/2018   Procedure: CYSTOSCOPY WITH LEFT RETROGRADE URETERAL STENT EXCHANGE;  Surgeon: [name], MD;  Location: [facility];  Service: Urology;  Laterality: Left;  . CYSTOSCOPY W/ URETERAL STENT PLACEMENT Left 01/31/2019   Procedure: CYSTOSCOPY WITH RETROGRADE PYELOGRAM/URETERAL STENT PLACEMENT;  Surgeon: [name], MD;  [facility];  Service: Urology;  Laterality: Left;  45 MINS  . CYSTOSCOPY W/ URETERAL STENT PLACEMENT Left 01/16/2020   Procedure: CYSTOSCOPY WITH RETROGRADE PYELOGRAM/URETERAL STENT EXCHANGED;  Surgeon: [name], MD;  Location[facility];  Service: Urology;  Laterality: Left;  . CYSTOSCOPY WITH RETROGRADE PYELOGRAM, URETEROSCOPY AND STENT PLACEMENT Left 10/21/2014   Procedure: CYSTOSCOPY WITH RETROGRADE PYELOGRAM, URETEROSCOPY,BIOPSY AND STENT CHANGE;  Surgeon: [name], MD;  Location[facility];  Service: Urology;  Laterality: Left;  . CYSTOSCOPY/RETROGRADE/URETEROSCOPY Left 06/03/2014   Procedure: CYSTOSCOPY/RETROGRADE/URETEROSCOPY WITH BIOPSY,STENT EXCHANGE;  Surgeon: [name], MD;  Location[facility];  Service: Urology;  Laterality: Left;  . ESOPHAGOGASTRODUODENOSCOPY N/A 09/28/2014   Procedure: ESOPHAGOGASTRODUODENOSCOPY (EGD);  Surgeon: Beryle Beams, MD;  Location: [facility];  Service: Endoscopy;  Laterality: N/A;  . GASTRIC ROUX-EN-Y     '74  . HERNIA REPAIR     bilateral hernia repairs   . LAPAROSCOPIC APPENDECTOMY N/A 02/15/2020   Procedure: APPENDECTOMY LAPAROSCOPIC;  Surgeon: Jovita Kussmaul, MD;  Location: [facility];  Service: General;  Laterality: N/A;  . TONSILLECTOMY  as child  . TOTAL HIP ARTHROPLASTY Left 05/19/2016   Procedure: LEFT TOTAL HIP ARTHROPLASTY ANTERIOR APPROACH;  Surgeon: Mcarthur Rossetti, MD;  Location: [facility];  Service: Orthopedics;  Laterality: Left;  . TRANSURETHRAL RESECTION OF PROSTATE    . VAGOTOMY  1950's     reports that he has quit smoking. His smoking use included cigarettes. He has quit using smokeless tobacco.  His smokeless tobacco use included chew. He reports previous alcohol use. He reports that he does not use drugs.  Allergies  Allergen Reactions  . Other Other (See Comments)    Bee stings - unknown    Family History  Problem Relation Age of Onset  . Heart disease Neg Hx      Prior to Admission medications   Medication Sig Start Date End Date Taking? Authorizing Provider  acetaminophen (TYLENOL) 500 MG tablet Take 1 tablet (500 mg total) by mouth every 6 (six) hours as needed for mild pain. 02/19/20  Yes [name], PA-C  albuterol  (VENTOLIN HFA) 108 (90 Base) MCG/ACT inhaler Inhale 2 puffs into the lungs every 6 (six) hours as needed for wheezing or shortness of breath.   Yes [provider]  amLODipine (NORVASC) 10 MG tablet Take 1 tablet (10 mg total) by mouth daily. 06/13/18  Yes Danford, Suann Larry, MD  Ascorbic Acid (VITAMIN C) 1000 MG tablet Take 2,000 mg by mouth daily.    Yes [provider]  aspirin EC 81 MG tablet Take 81 mg by mouth daily.   Yes [provider]  Cholecalciferol (VITAMIN D) 50 MCG (2000 UT) CAPS Take 4,000 Units by mouth daily.   Yes [provider]  cyanocobalamin (,VITAMIN B-12,) 1000 MCG/ML injection Inject 1,000 mcg into the muscle every 30 (thirty) days.   Yes [provider]  fish oil-omega-3 fatty acids 1000 MG capsule Take 1 g by mouth 2 (two) times a week.    Yes [provider]  guaiFENesin (MUCINEX) 600 MG 12 hr tablet Take 1,200 mg by mouth 2 (two) times daily.    Yes [provider]  hydroxypropyl methylcellulose / hypromellose (ISOPTO TEARS / GONIOVISC) 2.5 % ophthalmic solution Place 1 drop into both eyes daily as needed for dry eyes.   Yes [provider]  Magnesium 100 MG TABS Take 100 mg by mouth daily.   Yes [provider]  meclizine (ANTIVERT) 25 MG tablet Take 25 mg by mouth daily as needed for dizziness.   Yes [provider]  Misc Natural Products (NEURIVA PO) Take 1 capsule by mouth daily.   Yes [provider]  Multiple Vitamin (MULTIVITAMIN WITH MINERALS) TABS tablet Take 1 tablet by mouth every morning.   Yes [provider]  PARoxetine (PAXIL) 40 MG tablet Take 1 tablet (40 mg total) by mouth daily. Patient taking differently: Take 40 mg by mouth at bedtime. 06/13/18  Yes Danford, Suann Larry, MD  psyllium (HYDROCIL/METAMUCIL) 95 % PACK Take 1 packet by mouth daily.    Yes [provider]  senna-docusate (SENOKOT-S) 8.6-50 MG tablet Take 1 tablet by  mouth at bedtime. Patient taking differently: Take 2 tablets by mouth at bedtime as needed for mild constipation. 06/13/18  Yes Danford, Suann Larry, MD  traZODone (DESYREL) 50 MG tablet Take 50 mg by mouth at bedtime.  01/19/20  Yes [provider]  vitamin E 100 UNIT capsule Take 100 Units by mouth every other day.   Yes [provider]  glycerin adult 2 g suppository Place 1 suppository rectally as needed for constipation. Patient not taking: No sig reported 02/19/20   [name], PA-C  traMADol (ULTRAM) 50 MG tablet Take 1 tablet (50 mg total) by mouth every 6 (six) hours as needed for moderate  pain or severe pain. Patient not taking: No sig reported 02/19/20   [name], [place]    Physical Exam: Vitals:   10/05/20 1415 10/05/20 1434 10/05/20 1545 10/05/20 2004  BP: (!) 143/74 133/70 (!) 145/94 (!) 120/44  Pulse: 73 69 76 (!) 40  Resp: (!) 22 20 20 18   Temp:  97.8 F (36.6 C) 97.7 F (36.5 C) 98.3 F (36.8 C)  TempSrc:  Oral Oral Oral  SpO2: 94% 95% 93% 95%  Weight:      Height:        Constitutional: Lethargic but arousable, oriented x3, not in any acute distress.   Skin: no rashes, no lesions, slightly poor skin turgor noted.   Eyes: Pupils are equally reactive to light.  No evidence of scleral icterus or conjunctival pallor.  ENMT: Slightly dry mucous membranes noted.  Posterior pharynx clear of any exudate or lesions.   Neck: normal, supple, no masses, no thyromegaly.  No evidence of jugular venous distension.   Respiratory: Very mild bibasilar rales.  No evidence of wheezing.  Normal respiratory effort. No accessory muscle use.  Cardiovascular: Regular rate and rhythm, no murmurs / rubs / gallops. No extremity edema. 2+ pedal pulses. No carotid bruits.  Chest:   Nontender without crepitus or deformity.   Back:   Nontender without crepitus or deformity. Abdomen: Abdomen is soft and nontender.  No evidence of intra-abdominal masses.  Positive bowel  sounds noted in all quadrants.   Musculoskeletal: No joint deformity upper and lower extremities. Good ROM, no contractures. Normal muscle tone.  Neurologic: Lethargic but arousable, oriented x3, CN 2-12 grossly intact. Sensation intact.  Patient moving all 4 extremities spontaneously.  Patient is following all commands.  Patient is responsive to verbal stimuli.   Psychiatric: Patient exhibits normal mood with appropriate affect.  Patient seems to possess insight as to their current situation.     Labs on Admission: I have personally reviewed following labs and imaging studies -   CBC: Recent Labs  Lab 10/05/20 0949 10/05/20 0957  WBC 12.4*  --   NEUTROABS 7.7  --   HGB 13.3 14.3  HCT 40.7 42.0  MCV 87.3  --   PLT 201  --    Basic Metabolic Panel: Recent Labs  Lab 10/05/20 0949 10/05/20 0957  NA 135 138  K 4.6 4.6  CL 101 100  CO2 24  --   GLUCOSE 104* 102*  BUN 17 23  CREATININE 1.85* 1.70*  CALCIUM 9.6  --    GFR: Estimated Creatinine Clearance: 30.9 mL/min (A) (by C-G formula based on SCr of 1.7 mg/dL (H)). Liver Function Tests: Recent Labs  Lab 10/05/20 0949  AST 31  ALT 11  ALKPHOS 79  BILITOT 0.8  PROT 7.1  ALBUMIN 3.5   No results for input(s): LIPASE, AMYLASE in the last 168 hours. No results for input(s): AMMONIA in the last 168 hours. Coagulation Profile: Recent Labs  Lab 10/05/20 0949  INR 1.0   Cardiac Enzymes: No results for input(s): CKTOTAL, CKMB, CKMBINDEX, TROPONINI in the last 168 hours. BNP (last 3 results) No results for input(s): PROBNP in the last 8760 hours. HbA1C: No results for input(s): HGBA1C in the last 72 hours. CBG: No results for input(s): GLUCAP in the last 168 hours. Lipid Profile: No results for input(s): CHOL, HDL, LDLCALC, TRIG, CHOLHDL, LDLDIRECT in the last 72 hours. Thyroid Function Tests: No results for input(s): TSH, T4TOTAL, FREET4, T3FREE, THYROIDAB in the last 72 hours. Anemia  Panel: No results for  input(s): VITAMINB12, FOLATE, FERRITIN, TIBC, IRON, RETICCTPCT in the last 72 hours. Urine analysis:    Component Value Date/Time   COLORURINE YELLOW 10/05/2020 1421   APPEARANCEUR CLEAR 10/05/2020 1421   LABSPEC 1.008 10/05/2020 1421   PHURINE 5.0 10/05/2020 1421   GLUCOSEU NEGATIVE 10/05/2020 1421   HGBUR NEGATIVE 10/05/2020 1421   BILIRUBINUR NEGATIVE 10/05/2020 1421   KETONESUR NEGATIVE 10/05/2020 1421   PROTEINUR NEGATIVE 10/05/2020 1421   UROBILINOGEN 0.2 05/06/2014 1815   NITRITE POSITIVE (A) 10/05/2020 1421   LEUKOCYTESUR LARGE (A) 10/05/2020 1421    Radiological Exams on Admission - Personally Reviewed: DG Chest 1 View  Result Date: 10/05/2020 CLINICAL DATA:  Concern for pneumonia EXAM: CHEST  1 VIEW COMPARISON:  Chest radiograph June 11, 2018 FINDINGS: The patient is rotated. The heart size and mediastinal contours are within normal limits. Low lung volumes with bibasilar subsegmental atelectasis superimposed on chronic bronchitic changes. Possible small right pleural effusion. Increased globular calcifications about the left shoulder. Left greater than right glenohumeral joint arthropathy. Surgical clips overlie the upper abdomen. IMPRESSION: 1. Low lung volumes with bibasilar subsegmental atelectasis superimposed on chronic bronchitic changes. No focal consolidation. 2. Possible small right pleural effusion versus scarring. Electronically Signed   By: Dahlia Bailiff MD   On: 10/05/2020 17:12   CT HEAD WO CONTRAST  Result Date: 10/05/2020 CLINICAL DATA:  Delirium, seizure EXAM: CT HEAD WITHOUT CONTRAST TECHNIQUE: Contiguous axial images were obtained from the base of the skull through the vertex without intravenous contrast. COMPARISON:  None. FINDINGS: Brain: No acute infarct or intracranial hemorrhage. No mass lesion. No midline shift, ventriculomegaly or extra-axial fluid collection. Chronic right cerebellar lacunar insult. Vascular: No hyperdense vessel or unexpected  calcification. Bilateral skull base atherosclerotic calcifications. Skull: Negative for fracture or focal lesion. Sinuses/Orbits: No acute finding. Mild left maxillary sinus hyperostosis. Clear paranasal sinuses. Other: None. IMPRESSION: No acute intracranial process. Chronic right cerebellar lacunar insult. Electronically Signed   By: Primitivo Gauze M.D.   On: 10/05/2020 10:11   MR BRAIN WO CONTRAST  Result Date: 10/05/2020 CLINICAL DATA:  Seizure, abnormal neuro exam EXAM: MRI HEAD WITHOUT CONTRAST TECHNIQUE: Multiplanar, multiecho pulse sequences of the brain and surrounding structures were obtained without intravenous contrast. COMPARISON:  10/05/2020. FINDINGS: Brain: Mild cortically based DWI hyperintensity involving the right parietal and occipitotemporal regions. No intracranial hemorrhage. No midline shift, ventriculomegaly or extra-axial fluid collection. No mass lesion. Chronic right cerebellar insult. Mild cerebral atrophy with ex vacuo dilatation. Vascular: Right V4 segment T2 hyperintense signal. Remaining intracranial major arterial flow voids are proximally preserved. Skull and upper cervical spine: Normal marrow signal. Sinuses/Orbits: Normal orbits. Clear paranasal sinuses. Pneumatized mastoid air cells. Other: Please note image quality is mildly degraded by motion artifact. IMPRESSION: Mild cortically based DWI hyperintensity involving the right parietal and occipitotemporal regions may reflect postictal sequela. No intracranial hemorrhage.  Chronic right cerebellar insult. Right V4 segment hyperintensity, slow flow versus chronic occlusion. Electronically Signed   By: Primitivo Gauze M.D.   On: 10/05/2020 13:27    EKG: Personally reviewed.  Rhythm is normal sinus rhythm with heart rate of 80 bpm.  Left anterior fascicular block.  No dynamic ST segment changes appreciated.  Assessment/Plan Principal Problem:   Seizure [facility])   Patient exhibited an episode of what sounds to be  seizure activity, witnessed by family  While patient has a longstanding history of seizure disorder he is not experienced a seizure in over 30 years.  The reason  for this sudden recurrence of seizure activity is unclear  MRI brain reveals no evidence of metastatic disease in the setting of known history of prostate cancer.  Patient has received 1 g of intravenous Keppra according to neurology recommendations.  We will transition patient to 5 mg p.o. twice daily until we get further recommendations  Will abstain from ordering EEG monitoring and await neurology recommendations  Hydrating patient gently with intravenous fluids  Seizure precautions  Obtaining chest x-ray and urinalysis to evaluate for any evidence of underlying early infection that may have lowered seizure threshold  Patient's case was discussed with Dr. Malen Gauze by the emergency department provider who stated that they will come evaluate the patient in consultation.  Their input is appreciated.  Active Problems:   Chronic kidney disease, stage 3b (HCC)   Creatinine currently near baseline  Strict input and output monitoring  Monitoring renal function and electrolytes with serial chemistries  Attempting to avoid nephrotoxic agents if at all possible    Essential hypertension   Continue home regimen of antihypertensive therapy    Prostate cancer metastatic to bone [facility])   MRI brain performed in the emergency department reveals no evidence of metastatic disease to brain  Continue outpatient follow-up    Chronic bronchitis ([place])  As needed bronchodilator therapy    Code Status:  Full code Family Communication: Wife is at bedside who has been updated on plan of care  Status is: Observation  The patient remains OBS appropriate and will d/c before 2 midnights.  Dispo: The patient is from: Home              Anticipated d/c is to: Home              Anticipated d/c date is: 2 days              Patient  currently is not medically stable to d/c.   Difficult to place patient No        Vernelle Emerald MD [facility] Pager [phone]  If 7PM-7AM, please contact night-coverage [email or web address] Use universal [place] password for that web site. If you do not have the password, please call the hospital operator.  10/05/2020, 11:22 PM

## 2020-10-05 NOTE — ED Notes (Signed)
Patient transported to MRI 

## 2020-10-05 NOTE — ED Notes (Signed)
Patient transported to CT 

## 2020-10-05 NOTE — ED Notes (Signed)
Male purewick placed on pt, advised we needed a urine sample.

## 2020-10-05 NOTE — ED Provider Notes (Signed)
[facility] EMERGENCY DEPARTMENT Provider Note   CSN: [id number] Arrival date & time: 10/05/20  3016     History Chief Complaint  Patient presents with  . Seizures    Steven Cain is a 87 y.o. male.  87 yo M with a chief complaints of loss of consciousness.  Patient apparently had an event for he was tensed back and then an episode after which where he had decreased responsiveness and what sounds like a postictal period that lasted for about 30 minutes or so.  No palpable pulses, ashen and bradypnea as well.  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  None since.  Currently has metastatic prostate cancer.  Had been at his baseline until this morning.  Eating and drinking per his normal.  Patient denies headache neck pain chest pain abdominal pain.  The history is provided by the patient.  Seizures Illness Severity:  Severe Onset quality:  Sudden Duration:  1 hour Timing:  Rare Progression:  Partially resolved Chronicity:  New Associated symptoms: no abdominal pain, no chest pain, no congestion, no diarrhea, no fever, no headaches, no myalgias, no rash, no shortness of breath and no vomiting        Past Medical History:  Diagnosis Date  . Abnormal EKG    hx of left anterior fasicular block on ekg, no cardiologist  . Anxiety   . AR (aortic regurgitation) 03/23/2017   Mild, Noted on ECHO  . Arthritis    DJD  . Benign enlargement of prostate    frequent UTI, trim of prostate done , with some issues of incontinence now.  . Chronic bronchitis ([place])    chronic- usually has phelgm often  . COVID-19 08/2019   fatigue and loss of taste and smell x 10 days did monoclonal antibody therapy  . Depression   . Dyspnea    with excertion  . Fluid retention in legs    resolved  . GERD (gastroesophageal reflux disease)    rare  . GI bleed   . Grade I diastolic dysfunction   . Hard of hearing    hear more on left  side- no hearing aid  . History of colon polyps   . History of transfusion 2016  . Hydronephrosis 2018   Left ckd stage 3 no nephrologist per wife [place]  . Hypertension   . Moderate concentric left ventricular hypertrophy 03/23/2017   Noted on ECHO  . Perforated, severe stomach ulcer ([place]) 1974  . Peripheral neuropathy    feet and toes  . Spinal stenosis   . TR (tricuspid regurgitation) 03/23/2017   Mild, Noted on ECHO  . Transfusion history    none recent  . Ureteral stricture    CHRONIC  . Urinary incontinence   . UTI (urinary tract infection)    frequent  . Wears glasses     Patient Active Problem List   Diagnosis Date Noted  . Acute appendicitis 02/15/2020  . Appendicitis 02/15/2020  . Pain in left hip   . Acute lower UTI 06/08/2018  . Nausea & vomiting 06/08/2018  . Syncope due to orthostatic hypotension 03/23/2017  . Hyponatremia 03/22/2017  . Essential hypertension 03/22/2017  . Osteoarthritis of left hip 05/19/2016  . History of total left hip replacement 05/19/2016  . CKD (chronic kidney disease) stage 3, GFR 30-59 ml/min (HCC) 09/27/2014  . Anemia associated with acute blood loss 09/27/2014  . Orthostasis 09/27/2014  . GI  bleed 09/27/2014  . Acute pyelonephritis 05/08/2014    Class: Acute  . Hydronephrosis of left kidney 05/08/2014    Class: Acute  . Ureteral mass 05/08/2014    Class: Acute  . UTI (urinary tract infection) 04/24/2013  . Chills 04/24/2013    Past Surgical History:  Procedure Laterality Date  . ABDOMINAL SURGERY     bilroths tube '74  . COLONOSCOPY N/A 09/29/2014   Procedure: COLONOSCOPY;  Surgeon: Beryle Beams, MD;  Location: [facility];  Service: Endoscopy;  Laterality: N/A;  . CYSTOSCOPY W/ URETERAL STENT PLACEMENT Left 05/06/2014   Procedure: CYSTOSCOPY WITH RETROGRADE PYELOGRAM/URETERAL STENT PLACEMENT;  Surgeon: [name], MD;  Location: [facility];  Service: Urology;  Laterality: Left;  . CYSTOSCOPY W/ URETERAL STENT  PLACEMENT Left 03/31/2015   Procedure: CYSTOSCOPY WITH LEFT RETROGRADE PYELOGRAM/URETERAL STENT EXCHANGE;  Surgeon: [name], MD;  Location: [facility];  Service: Urology;  Laterality: Left;  . CYSTOSCOPY W/ URETERAL STENT PLACEMENT Left 09/15/2015   Procedure: CYSTOSCOPY WITH LEFT RETROGRADE PYELOGRAM/URETERAL STENT EXCHANGE ;  Surgeon: [name], MD;  Location: [facility];  Service: Urology;  Laterality: Left;  . CYSTOSCOPY W/ URETERAL STENT PLACEMENT Left 04/28/2016   Procedure: CYSTOSCOPY WITH RETROGRADE PYELOGRAM/URETERAL STENT EXCHANGE;  Surgeon: [name], MD;  Location: [facility];  Service: Urology;  Laterality: Left;  . CYSTOSCOPY W/ URETERAL STENT PLACEMENT Left 11/08/2016   Procedure: CYSTOSCOPY WITH RETROGRADE PYELOGRAM/URETERAL STENT EXCHANGE;  Surgeon: [name], MD;  Location: [facility];  Service: Urology;  Laterality: Left;  . CYSTOSCOPY W/ URETERAL STENT PLACEMENT Left 09/14/2017   Procedure: CYSTOSCOPY WITH RETROGRADE PYELOGRAM/URETERAL STENT PLACEMENT;  Surgeon: [name], MD;  Location: [facility];  Service: Urology;  Laterality: Left;  . CYSTOSCOPY W/ URETERAL STENT PLACEMENT Left 04/10/2018   Procedure: CYSTOSCOPY WITH LEFT RETROGRADE URETERAL STENT EXCHANGE;  Surgeon: [name], MD;  Location: [facility];  Service: Urology;  Laterality: Left;  . CYSTOSCOPY W/ URETERAL STENT PLACEMENT Left 01/31/2019   Procedure: CYSTOSCOPY WITH RETROGRADE PYELOGRAM/URETERAL STENT PLACEMENT;  Surgeon: [name], MD;  Location: [facility];  Service: Urology;  Laterality: Left;  45 MINS  . CYSTOSCOPY W/ URETERAL STENT PLACEMENT Left 01/16/2020   Procedure: CYSTOSCOPY WITH RETROGRADE PYELOGRAM/URETERAL STENT EXCHANGED;  Surgeon: [name], MD;  Location: [facility];  Service: Urology;  Laterality: Left;  . CYSTOSCOPY WITH RETROGRADE PYELOGRAM, URETEROSCOPY AND STENT PLACEMENT Left 10/21/2014   Procedure: CYSTOSCOPY WITH RETROGRADE PYELOGRAM,  URETEROSCOPY,BIOPSY AND STENT CHANGE;  Surgeon: [name], MD;  Location: [facility];  Service: Urology;  Laterality: Left;  . CYSTOSCOPY/RETROGRADE/URETEROSCOPY Left 06/03/2014   Procedure: CYSTOSCOPY/RETROGRADE/URETEROSCOPY WITH BIOPSY,STENT EXCHANGE;  Surgeon: [name], MD;  Location: [facility];  Service: Urology;  Laterality: Left;  . ESOPHAGOGASTRODUODENOSCOPY N/A 09/28/2014   Procedure: ESOPHAGOGASTRODUODENOSCOPY (EGD);  Surgeon: Beryle Beams, MD;  Location: [facility];  Service: Endoscopy;  Laterality: N/A;  . GASTRIC ROUX-EN-Y     '74  . HERNIA REPAIR     bilateral hernia repairs   . LAPAROSCOPIC APPENDECTOMY N/A 02/15/2020   Procedure: APPENDECTOMY LAPAROSCOPIC;  Surgeon: Jovita Kussmaul, MD;  Location: [facility];  Service: General;  Laterality: N/A;  . TONSILLECTOMY  as child  . TOTAL HIP ARTHROPLASTY Left 05/19/2016   Procedure: LEFT TOTAL HIP ARTHROPLASTY ANTERIOR APPROACH;  Surgeon: Mcarthur Rossetti, MD;  Location: [facility];  Service: Orthopedics;  Laterality: Left;  . TRANSURETHRAL RESECTION OF PROSTATE    . VAGOTOMY  1950's       No family history  on file.  Social History   Tobacco Use  . Smoking status: Former Smoker    Types: Cigarettes  . Smokeless tobacco: Former Systems developer    Types: Chew  . Tobacco comment: quit yrs ago  Vaping Use  . Vaping Use: Never used  Substance Use Topics  . Alcohol use: Not Currently  . Drug use: No    Home Medications Prior to Admission medications   Medication Sig Start Date End Date Taking? Authorizing Provider  acetaminophen (TYLENOL) 500 MG tablet Take 1 tablet (500 mg total) by mouth every 6 (six) hours as needed for mild pain. 02/19/20   [name], PA-C  albuterol (VENTOLIN HFA) 108 (90 Base) MCG/ACT inhaler Inhale 2 puffs into the lungs every 6 (six) hours as needed for wheezing or shortness of breath.    [provider]  amLODipine (NORVASC) 10 MG tablet Take 1 tablet (10 mg total) by mouth daily. 06/13/18    Danford, Suann Larry, MD  Ascorbic Acid (VITAMIN C) 1000 MG tablet Take 2,000 mg by mouth daily.     [provider]  aspirin EC 81 MG tablet Take 81 mg by mouth daily.    [provider]  Cholecalciferol (VITAMIN D) 50 MCG (2000 UT) CAPS Take 4,000 Units by mouth daily.    [provider]  cyanocobalamin (,VITAMIN B-12,) 1000 MCG/ML injection Inject 1,000 mcg into the muscle every 30 (thirty) days.    [provider]  fish oil-omega-3 fatty acids 1000 MG capsule Take 1 g by mouth 2 (two) times a week.     [provider]  glycerin adult 2 g suppository Place 1 suppository rectally as needed for constipation. 02/19/20   [name], PA-C  guaiFENesin (MUCINEX) 600 MG 12 hr tablet Take 1,200 mg by mouth 2 (two) times daily.     [provider]  lisinopril (ZESTRIL) 5 MG tablet Take 5 mg by mouth in the morning and at bedtime.     [provider]  meclizine (ANTIVERT) 25 MG tablet Take 25 mg by mouth 2 (two) times daily as needed for dizziness.    [provider]  Multiple Vitamin (MULTIVITAMIN WITH MINERALS) TABS tablet Take 1 tablet by mouth every morning.    [provider]  PARoxetine (PAXIL) 40 MG tablet Take 1 tablet (40 mg total) by mouth daily. Patient taking differently: Take 40 mg by mouth at bedtime.  06/13/18   Danford, Suann Larry, MD  psyllium (HYDROCIL/METAMUCIL) 95 % PACK Take 1 packet by mouth daily.     [provider]  senna-docusate (SENOKOT-S) 8.6-50 MG tablet Take 1 tablet by mouth at bedtime. Patient taking differently: Take 2 tablets by mouth at bedtime as needed for mild constipation.  06/13/18   Danford, Suann Larry, MD  traMADol (ULTRAM) 50 MG tablet Take 1 tablet (50 mg total) by mouth every 6 (six) hours as needed for moderate pain or severe pain. 02/19/20   [name], PA-C  traZODone (DESYREL) 50 MG tablet Take 50 mg by mouth at bedtime.  01/19/20   [provider]  vitamin E 100 UNIT capsule Take 100 Units by mouth every morning.     [provider]    Allergies    Patient has no known allergies.  Review of Systems   Review of Systems  Constitutional: Negative for chills and fever.  HENT: Negative for congestion and facial swelling.   Eyes: Negative for discharge and visual disturbance.  Respiratory:  Negative for shortness of breath.   Cardiovascular: Negative for chest pain and palpitations.  Gastrointestinal: Negative for abdominal pain, diarrhea and vomiting.  Musculoskeletal: Negative for arthralgias and myalgias.  Skin: Negative for color change and rash.  Neurological: Positive for syncope. Negative for tremors, seizures and headaches.  Psychiatric/Behavioral: Negative for confusion and dysphoric mood.    Physical Exam Updated Vital Signs BP (!) 144/72   Pulse 76   Temp 97.9 F (36.6 C) (Oral)   Resp 18   Ht 5\' 5"  (1.651 m)   Wt 86.2 kg   SpO2 97%   BMI 31.62 kg/m   Physical Exam Vitals and nursing note reviewed.  Constitutional:      Appearance: He is well-developed and well-nourished.  HENT:     Head: Normocephalic and atraumatic.  Eyes:     Extraocular Movements: EOM normal.     Pupils: Pupils are equal, round, and reactive to light.  Neck:     Vascular: No JVD.  Cardiovascular:     Rate and Rhythm: Normal rate and regular rhythm.     Heart sounds: No murmur heard. No friction rub. No gallop.   Pulmonary:     Effort: No respiratory distress.     Breath sounds: No wheezing.  Abdominal:     General: There is no distension.     Tenderness: There is no guarding or rebound.  Musculoskeletal:        General: Normal range of motion.     Cervical back: Normal range of motion and neck supple.  Skin:    Coloration: Skin is not pale.     Findings: No rash.  Neurological:     Mental Status: He is alert and oriented to person, place, and time.     Comments: Past pointing with RUE. Otherwise benign exam   Psychiatric:        Mood and Affect: Mood and affect normal.        Behavior: Behavior normal.     ED Results / Procedures / Treatments   Labs (all labs ordered are listed, but only abnormal results are displayed) Labs Reviewed  ETHANOL  PROTIME-INR  APTT  CBC  DIFFERENTIAL  COMPREHENSIVE METABOLIC PANEL  RAPID URINE DRUG SCREEN, HOSP PERFORMED  URINALYSIS, ROUTINE W REFLEX MICROSCOPIC  I-STAT CHEM 8, ED    EKG EKG Interpretation  Date/Time:  Tuesday October 05 2020 09:31:05 EST Ventricular Rate:  80 PR Interval:    QRS Duration: 108 QT Interval:  389 QTC Calculation: 449 R Axis:   -58 Text Interpretation: Sinus rhythm Prolonged PR interval Left anterior fascicular block Abnormal R-wave progression, early transition No significant change since last tracing Confirmed by Deno Etienne [phone]) on 10/05/2020 9:40:17 AM   Radiology No results found.  Procedures Procedures   Medications Ordered in ED Medications  sodium chloride 0.9 % bolus 1,000 mL (has no administration in time range)    ED Course  I have reviewed the triage vital signs and the nursing notes.  Pertinent labs & imaging results that were available during my care of the patient were reviewed by me and considered in my medical decision making (see chart for details).    MDM Rules/Calculators/A&P                          87 yo M with an episode where he lost consciousness.  Currently is confused and likely postictal based on the history.  On finger-to-nose he actually  has past-pointing with the right upper extremity and not the left.  Will obtain a CT scan of the head.  Blood work.  Bolus of IV fluids.  Reassess.  CT the head without obvious pathology.  Patient normally is hyponatremic but today is not.  No other significant electrolyte abnormalities.  I discussed the case with Dr. Rory Percy, neurology recommended an MRI of the brain.  Negative for acute pathology.  Neurology recommending 1 g load of Keppra.   Patient was extremely claustrophobic and so required Ativan prior to the imaging study.  More sleepy on repeat exam.  With the severity of the initial presentation and some concern for loss of pulses discussed with medicine for admission.  CRITICAL CARE Performed by: Cecilio Asper   Total critical care time: 35 minutes  Critical care time was exclusive of separately billable procedures and treating other patients.  Critical care was necessary to treat or prevent imminent or life-threatening deterioration.  Critical care was time spent personally by me on the following activities: development of treatment plan with patient and/or surrogate as well as nursing, discussions with consultants, evaluation of patient's response to treatment, examination of patient, obtaining history from patient or surrogate, ordering and performing treatments and interventions, ordering and review of laboratory studies, ordering and review of radiographic studies, pulse oximetry and re-evaluation of patient's condition.  The patients results and plan were reviewed and discussed.   Any x-rays performed were independently reviewed by myself.   Differential diagnosis were considered with the presenting HPI.  Medications  sodium chloride 0.9 % bolus 1,000 mL (0 mLs Intravenous Stopped 10/05/20 1054)  LORazepam (ATIVAN) injection 1 mg (1 mg Intravenous Given 10/05/20 1054)  levETIRAcetam (KEPPRA) IVPB 1000 mg/100 mL premix ( Intravenous Stopped 10/05/20 1431)    Vitals:   10/05/20 1030 10/05/20 1330 10/05/20 1415 10/05/20 1434  BP: (!) 145/66 116/63 (!) 143/74 133/70  Pulse: 72 72 73 69  Resp: 16 16 (!) 22 20  Temp:    97.8 F (36.6 C)  TempSrc:    Oral  SpO2: 97% 93% 94% 95%  Weight:      Height:        Final diagnoses:  None    Admission/ observation were discussed with the admitting physician, patient and/or family and they are comfortable with the plan.    Final Clinical Impression(s) / ED  Diagnoses Final diagnoses:  None    Rx / DC Orders ED Discharge Orders    None       Deno Etienne, DO 10/05/20 1456

## 2020-10-05 NOTE — ED Notes (Addendum)
Pt still in MRI at this time

## 2020-10-05 NOTE — Plan of Care (Signed)
  Problem: Education: Goal: Expressions of having a comfortable level of knowledge regarding the disease process will increase Outcome: Progressing   

## 2020-10-05 NOTE — ED Triage Notes (Signed)
Pt from home with ems for witnessed seizure by daughter, lasting a couple mins, full body shaking, foaming at the mouth. Hx of seizures about 30 years ago, not on any meds for seizures at this time. Hx of metastatic prostate cancer. Pt was post ictal for about 35-40 mins. Pt arrives to ED alert, oriented. VSS CBG 109

## 2020-10-06 ENCOUNTER — Observation Stay (HOSPITAL_COMMUNITY): Payer: Medicare Other

## 2020-10-06 DIAGNOSIS — I951 Orthostatic hypotension: Secondary | ICD-10-CM | POA: Diagnosis not present

## 2020-10-06 DIAGNOSIS — Z8546 Personal history of malignant neoplasm of prostate: Secondary | ICD-10-CM | POA: Diagnosis not present

## 2020-10-06 DIAGNOSIS — Z7401 Bed confinement status: Secondary | ICD-10-CM | POA: Diagnosis not present

## 2020-10-06 DIAGNOSIS — I441 Atrioventricular block, second degree: Secondary | ICD-10-CM | POA: Diagnosis present

## 2020-10-06 DIAGNOSIS — G9341 Metabolic encephalopathy: Secondary | ICD-10-CM | POA: Diagnosis not present

## 2020-10-06 DIAGNOSIS — B965 Pseudomonas (aeruginosa) (mallei) (pseudomallei) as the cause of diseases classified elsewhere: Secondary | ICD-10-CM | POA: Diagnosis present

## 2020-10-06 DIAGNOSIS — C61 Malignant neoplasm of prostate: Secondary | ICD-10-CM | POA: Diagnosis not present

## 2020-10-06 DIAGNOSIS — Z87440 Personal history of urinary (tract) infections: Secondary | ICD-10-CM | POA: Diagnosis not present

## 2020-10-06 DIAGNOSIS — I444 Left anterior fascicular block: Secondary | ICD-10-CM | POA: Diagnosis present

## 2020-10-06 DIAGNOSIS — N390 Urinary tract infection, site not specified: Secondary | ICD-10-CM | POA: Diagnosis not present

## 2020-10-06 DIAGNOSIS — M1990 Unspecified osteoarthritis, unspecified site: Secondary | ICD-10-CM | POA: Diagnosis present

## 2020-10-06 DIAGNOSIS — R569 Unspecified convulsions: Secondary | ICD-10-CM

## 2020-10-06 DIAGNOSIS — E1122 Type 2 diabetes mellitus with diabetic chronic kidney disease: Secondary | ICD-10-CM | POA: Diagnosis present

## 2020-10-06 DIAGNOSIS — G629 Polyneuropathy, unspecified: Secondary | ICD-10-CM | POA: Diagnosis present

## 2020-10-06 DIAGNOSIS — I082 Rheumatic disorders of both aortic and tricuspid valves: Secondary | ICD-10-CM | POA: Diagnosis present

## 2020-10-06 DIAGNOSIS — F05 Delirium due to known physiological condition: Secondary | ICD-10-CM | POA: Diagnosis not present

## 2020-10-06 DIAGNOSIS — I10 Essential (primary) hypertension: Secondary | ICD-10-CM | POA: Diagnosis not present

## 2020-10-06 DIAGNOSIS — H9190 Unspecified hearing loss, unspecified ear: Secondary | ICD-10-CM | POA: Diagnosis present

## 2020-10-06 DIAGNOSIS — I129 Hypertensive chronic kidney disease with stage 1 through stage 4 chronic kidney disease, or unspecified chronic kidney disease: Secondary | ICD-10-CM | POA: Diagnosis present

## 2020-10-06 DIAGNOSIS — I499 Cardiac arrhythmia, unspecified: Secondary | ICD-10-CM | POA: Diagnosis present

## 2020-10-06 DIAGNOSIS — E785 Hyperlipidemia, unspecified: Secondary | ICD-10-CM | POA: Diagnosis present

## 2020-10-06 DIAGNOSIS — Z20822 Contact with and (suspected) exposure to covid-19: Secondary | ICD-10-CM | POA: Diagnosis present

## 2020-10-06 DIAGNOSIS — R55 Syncope and collapse: Secondary | ICD-10-CM

## 2020-10-06 DIAGNOSIS — N179 Acute kidney failure, unspecified: Secondary | ICD-10-CM | POA: Diagnosis present

## 2020-10-06 DIAGNOSIS — C7951 Secondary malignant neoplasm of bone: Secondary | ICD-10-CM | POA: Diagnosis present

## 2020-10-06 DIAGNOSIS — E782 Mixed hyperlipidemia: Secondary | ICD-10-CM | POA: Diagnosis not present

## 2020-10-06 DIAGNOSIS — G40909 Epilepsy, unspecified, not intractable, without status epilepticus: Secondary | ICD-10-CM | POA: Diagnosis not present

## 2020-10-06 DIAGNOSIS — F40240 Claustrophobia: Secondary | ICD-10-CM | POA: Diagnosis present

## 2020-10-06 DIAGNOSIS — J42 Unspecified chronic bronchitis: Secondary | ICD-10-CM | POA: Diagnosis present

## 2020-10-06 DIAGNOSIS — I440 Atrioventricular block, first degree: Secondary | ICD-10-CM | POA: Diagnosis not present

## 2020-10-06 DIAGNOSIS — N1832 Chronic kidney disease, stage 3b: Secondary | ICD-10-CM | POA: Diagnosis present

## 2020-10-06 DIAGNOSIS — K219 Gastro-esophageal reflux disease without esophagitis: Secondary | ICD-10-CM | POA: Diagnosis present

## 2020-10-06 LAB — COMPREHENSIVE METABOLIC PANEL
ALT: 11 U/L (ref 0–44)
AST: 22 U/L (ref 15–41)
Albumin: 2.7 g/dL — ABNORMAL LOW (ref 3.5–5.0)
Alkaline Phosphatase: 69 U/L (ref 38–126)
Anion gap: 8 (ref 5–15)
BUN: 18 mg/dL (ref 8–23)
CO2: 26 mmol/L (ref 22–32)
Calcium: 8.9 mg/dL (ref 8.9–10.3)
Chloride: 104 mmol/L (ref 98–111)
Creatinine, Ser: 1.58 mg/dL — ABNORMAL HIGH (ref 0.61–1.24)
GFR, Estimated: 42 mL/min — ABNORMAL LOW (ref >60)
Glucose, Bld: 113 mg/dL — ABNORMAL HIGH (ref 70–99)
Potassium: 3.8 mmol/L (ref 3.5–5.1)
Sodium: 138 mmol/L (ref 135–145)
Total Bilirubin: 0.4 mg/dL (ref 0.3–1.2)
Total Protein: 5.6 g/dL — ABNORMAL LOW (ref 6.5–8.1)

## 2020-10-06 LAB — MAGNESIUM: Magnesium: 2.7 mg/dL — ABNORMAL HIGH (ref 1.7–2.4)

## 2020-10-06 LAB — CBC WITH DIFFERENTIAL/PLATELET
Abs Immature Granulocytes: 0.03 10*3/uL (ref 0.00–0.07)
Basophils Absolute: 0.1 10*3/uL (ref 0.0–0.1)
Basophils Relative: 1 %
Eosinophils Absolute: 0.7 10*3/uL — ABNORMAL HIGH (ref 0.0–0.5)
Eosinophils Relative: 8 %
HCT: 32.3 % — ABNORMAL LOW (ref 39.0–52.0)
Hemoglobin: 10.9 g/dL — ABNORMAL LOW (ref 13.0–17.0)
Immature Granulocytes: 0 %
Lymphocytes Relative: 24 %
Lymphs Abs: 2.1 10*3/uL (ref 0.7–4.0)
MCH: 28.7 pg (ref 26.0–34.0)
MCHC: 33.7 g/dL (ref 30.0–36.0)
MCV: 85 fL (ref 80.0–100.0)
Monocytes Absolute: 0.6 10*3/uL (ref 0.1–1.0)
Monocytes Relative: 7 %
Neutro Abs: 5 10*3/uL (ref 1.7–7.7)
Neutrophils Relative %: 60 %
Platelets: 181 10*3/uL (ref 150–400)
RBC: 3.8 MIL/uL — ABNORMAL LOW (ref 4.22–5.81)
RDW: 14.6 % (ref 11.5–15.5)
WBC: 8.4 10*3/uL (ref 4.0–10.5)
nRBC: 0 % (ref 0.0–0.2)

## 2020-10-06 LAB — TROPONIN I (HIGH SENSITIVITY)
Troponin I (High Sensitivity): 64 ng/L — ABNORMAL HIGH (ref <18)
Troponin I (High Sensitivity): 50 ng/L — ABNORMAL HIGH (ref <18)

## 2020-10-06 LAB — TSH: TSH: 1.214 u[IU]/mL (ref 0.350–4.500)

## 2020-10-06 MED ORDER — SODIUM CHLORIDE 0.9 % IV SOLN: INTRAVENOUS | Status: DC | PRN

## 2020-10-06 MED ORDER — MAGNESIUM SULFATE 2 GM/50ML IV SOLN
2.0000 g | Freq: Once | INTRAVENOUS | Status: AC
  Administered 2020-10-06: 2 g via INTRAVENOUS
  Filled 2020-10-06: qty 50

## 2020-10-06 NOTE — Progress Notes (Signed)
PROGRESS NOTE    RAYQUON USELMAN  KZS:[id number] DOB: 06/16/1931 DOA: 10/05/2020 PCP: Shon Baton, MD  Chief Complaint  Patient presents with  . 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  Assessment & Plan:   Principal Problem:   Seizure ([place]) Active Problems:   Chronic kidney disease, stage 3b ([place])   Essential hypertension   GERD without esophagitis   Prostate cancer metastatic to bone ([facility])   Chronic bronchitis ([facility])   Cardiac arrhythmia  Syncope  Seizure Like Activity: appreciate neurology consultation, suspicion at this time is 2/2 syncopal convulsion or convulsions in setting of arrhythmia.  EEG without seizures or epileptiform discharges MRI brain with mild cortically based DWI hyperintensity involving R parietal and occipitotemporal regions, may reflect postictal sequela.  No intracranial hemorrhage.  Chronic R cerebellar insult.  R V4 segment hyperintensity. 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, but now with mobitz I/II seen on telemetry  EP planning to follow and reeval tomorrow, appreciate recommendations TSH wnl Amlodipine on hold Echocardiogram pending  Concern for torsades Likely artifact per cards  Pyuria UA with positive nitrite (follow cultures) Follow culture  AKI on CKD IIIb Baseline creatinine unclear, but recently 1.7-1.9 Currently better than baseline Follow  Prostate Cancer metastatic to bone Follow  outpatient  Chronic bronchitis Continue home meds  DVT prophylaxis: lovenox Code Status: full  Family Communication: partner at bedside Disposition:   Status is: Inpatient  Remains inpatient appropriate because:Inpatient level of care appropriate due to severity of illness   Dispo: The patient is from: Home              Anticipated d/c is to: Home              Anticipated d/c date is: > 3 days              Patient currently is not medically stable to d/c.   Difficult to place patient No   Consultants:   Cardiology  EP  neurology  Procedures:  EEG  IMPRESSION: This study is within normal limits. No seizures or epileptiform discharges were seen throughout the recording.   Antimicrobials:  Anti-infectives (From admission, onward)   None     Subjective: No new complaints, pleasant Wife at bedside  Objective: Vitals:   10/06/20 0521 10/06/20 0810 10/06/20 1119 10/06/20 1657  BP: (!) 147/49 (!) 129/51 (!) 153/53 (!) 140/102  Pulse: (!) 41 (!) 50 (!) 37 (!) 38  Resp: 18 18 17 16   Temp: (!) 97.5 F (36.4 C) 97.7 F (36.5 C) 97.6 F (36.4 C) 97.8 F (36.6 C)  TempSrc: Oral Oral Oral Oral  SpO2: 94% 97% 97% 96%  Weight:      Height:        Intake/Output Summary (Last 24 hours) at 10/06/2020 1914 Last data filed at 10/06/2020 1259 Gross per 24 hour  Intake 81.5 ml  Output 1700 ml  Net -1618.5 ml   Filed Weights   10/05/20 0937  Weight: 86.2 kg    Examination:  General exam: Appears  calm and comfortable. Pleasant. Respiratory system: Clear to auscultation. Respiratory effort normal. Cardiovascular system: bradycardic, regular  Gastrointestinal system: Abdomen is nondistended, soft and nontender. Central nervous system: Alert and oriented. No focal neurological deficits. Extremities: no LEE Skin: No rashes, lesions or ulcers Psychiatry: Judgement and insight appear normal. Mood & affect appropriate.   Data Reviewed: I have personally reviewed  following labs and imaging studies  CBC: Recent Labs  Lab 10/05/20 0949 10/05/20 0957 10/06/20 0339  WBC 12.4*  --  8.4  NEUTROABS 7.7  --  5.0  HGB 13.3 14.3 10.9*  HCT 40.7 42.0 32.3*  MCV 87.3  --  85.0  PLT 201  --  329    Basic Metabolic Panel: Recent Labs  Lab 10/05/20 0949 10/05/20 0957 10/06/20 0339  NA 135 138 138  K 4.6 4.6 3.8  CL 101 100 104  CO2 24  --  26  GLUCOSE 104* 102* 113*  BUN 17 23 18   CREATININE 1.85* 1.70* 1.58*  CALCIUM 9.6  --  8.9  MG  --   --  2.7*    GFR: Estimated Creatinine Clearance: 33.3 mL/min (A) (by C-G formula based on SCr of 1.58 mg/dL (H)).  Liver Function Tests: Recent Labs  Lab 10/05/20 0949 10/06/20 0339  AST 31 22  ALT 11 11  ALKPHOS 79 69  BILITOT 0.8 0.4  PROT 7.1 5.6*  ALBUMIN 3.5 2.7*    CBG: No results for input(s): GLUCAP in the last 168 hours.   Recent Results (from the past 240 hour(s))  Resp Panel by RT-PCR (Flu A&B, Covid) Nasopharyngeal Swab     Status: None   Collection Time: 10/05/20  2:19 PM   Specimen: Nasopharyngeal Swab; Nasopharyngeal(NP) swabs in vial transport medium  Result Value Ref Range Status   SARS Coronavirus 2 by RT PCR NEGATIVE NEGATIVE Final    Comment: (NOTE) SARS-CoV-2 target nucleic acids are NOT DETECTED.  The SARS-CoV-2 RNA is generally detectable in upper respiratory specimens during the acute phase of infection. The lowest concentration of SARS-CoV-2 viral copies this assay can detect is 138 copies/mL. A negative result does not preclude SARS-Cov-2 infection and should not be used as the sole basis for treatment or other patient management decisions. A negative result may occur with  improper specimen collection/handling, submission of specimen other than nasopharyngeal swab, presence of viral mutation(s) within the areas targeted by this assay, and inadequate number of viral copies(<138 copies/mL). A negative result must be combined with clinical observations,  patient history, and epidemiological information. The expected result is Negative.  Fact Sheet for Patients:  EntrepreneurPulse.com.au  Fact Sheet for Healthcare Providers:  IncredibleEmployment.be  This test is no t yet approved or cleared by the [place] FDA and  has been authorized for detection and/or diagnosis of SARS-CoV-2 by FDA under an Emergency Use Authorization (EUA). This EUA will remain  in effect (meaning this test can be used) for the duration of the COVID-19 declaration under Section 564(b)(1) of the Act, 21 U.S.C.section 360bbb-3(b)(1), unless the authorization is terminated  or revoked sooner.       Influenza A by PCR NEGATIVE NEGATIVE Final   Influenza B by PCR NEGATIVE NEGATIVE Final    Comment: (NOTE) The Xpert Xpress SARS-CoV-2/FLU/RSV plus assay is intended as an aid in the diagnosis of influenza from Nasopharyngeal swab specimens and should not be used as a sole basis for treatment. Nasal washings and aspirates are unacceptable for Xpert Xpress SARS-CoV-2/FLU/RSV testing.  Fact Sheet for  Patients: EntrepreneurPulse.com.au  Fact Sheet for Healthcare Providers: IncredibleEmployment.be  This test is not yet approved or cleared by the [place] FDA and has been authorized for detection and/or diagnosis of SARS-CoV-2 by FDA under an Emergency Use Authorization (EUA). This EUA will remain in effect (meaning this test can be used) for the duration of the COVID-19 declaration under Section 564(b)(1) of the Act, 21 U.S.C. section 360bbb-3(b)(1), unless the authorization is terminated or revoked.  Performed at [facility], [place] [address] [place], [place] [address]          Radiology Studies: DG Chest 1 View  Result Date: 10/05/2020 CLINICAL DATA:  Concern for pneumonia EXAM: CHEST  1 VIEW COMPARISON:  Chest radiograph June 11, 2018 FINDINGS: The patient is  rotated. The heart size and mediastinal contours are within normal limits. Low lung volumes with bibasilar subsegmental atelectasis superimposed on chronic bronchitic changes. Possible small right pleural effusion. Increased globular calcifications about the left shoulder. Left greater than right glenohumeral joint arthropathy. Surgical clips overlie the upper abdomen. IMPRESSION: 1. Low lung volumes with bibasilar subsegmental atelectasis superimposed on chronic bronchitic changes. No focal consolidation. 2. Possible small right pleural effusion versus scarring. Electronically Signed   By: Dahlia Bailiff MD   On: 10/05/2020 17:12   CT HEAD WO CONTRAST  Result Date: 10/05/2020 CLINICAL DATA:  Delirium, seizure EXAM: CT HEAD WITHOUT CONTRAST TECHNIQUE: Contiguous axial images were obtained from the base of the skull through the vertex without intravenous contrast. COMPARISON:  None. FINDINGS: Brain: No acute infarct or intracranial hemorrhage. No mass lesion. No midline shift, ventriculomegaly or extra-axial fluid collection. Chronic right cerebellar lacunar insult. Vascular: No hyperdense vessel or unexpected calcification. Bilateral skull base atherosclerotic calcifications. Skull: Negative for fracture or focal lesion. Sinuses/Orbits: No acute finding. Mild left maxillary sinus hyperostosis. Clear paranasal sinuses. Other: None. IMPRESSION: No acute intracranial process. Chronic right cerebellar lacunar insult. Electronically Signed   By: Primitivo Gauze M.D.   On: 10/05/2020 10:11   MR BRAIN WO CONTRAST  Result Date: 10/05/2020 CLINICAL DATA:  Seizure, abnormal neuro exam EXAM: MRI HEAD WITHOUT CONTRAST TECHNIQUE: Multiplanar, multiecho pulse sequences of the brain and surrounding structures were obtained without intravenous contrast. COMPARISON:  10/05/2020. FINDINGS: Brain: Mild cortically based DWI hyperintensity involving the right parietal and occipitotemporal regions. No intracranial hemorrhage.  No midline shift, ventriculomegaly or extra-axial fluid collection. No mass lesion. Chronic right cerebellar insult. Mild cerebral atrophy with ex vacuo dilatation. Vascular: Right V4 segment T2 hyperintense signal. Remaining intracranial major arterial flow voids are proximally preserved. Skull and upper cervical spine: Normal marrow signal. Sinuses/Orbits: Normal orbits. Clear paranasal sinuses. Pneumatized mastoid air cells. Other: Please note image quality is mildly degraded by motion artifact. IMPRESSION: Mild cortically based DWI hyperintensity involving the right parietal and occipitotemporal regions may reflect postictal sequela. No intracranial hemorrhage.  Chronic right cerebellar insult. Right V4 segment hyperintensity, slow flow versus chronic occlusion. Electronically Signed   By: Primitivo Gauze M.D.   On: 10/05/2020 13:27   EEG adult  Result Date: 10/06/2020 Lora Havens, MD     10/06/2020  1:13 PM Patient Name: LEALON VANPUTTEN MRN: [id number] Epilepsy Attending: Lora Havens Referring Physician/Provider: Dr. Fayrene Helper Date: 10/06/2020 Duration: 23.30 minutes Patient history: 87 year old male presented with seizure-like activity.  EEG to evaluate for seizures. Level of alertness: Awake, asleep AEDs during EEG study: Keppra Technical aspects: This EEG study was done with scalp electrodes positioned according to the 10-20 International system of  electrode placement. Electrical activity was acquired at a sampling rate of 500Hz  and reviewed with a high frequency filter of 70Hz  and a low frequency filter of 1Hz . EEG data were recorded continuously and digitally stored. Description: The posterior dominant rhythm consists of 8 Hz activity of moderate voltage (25-35 uV) seen predominantly in posterior head regions, symmetric and reactive to eye opening and eye closing. Sleep was characterized by vertex waves, sleep spindles (12 to 14 Hz), maximal frontocentral region. Hyperventilation and  photic stimulation were not performed.   IMPRESSION: This study is within normal limits. No seizures or epileptiform discharges were seen throughout the recording. Priyanka Barbra Sarks        Scheduled Meds: . aspirin EC  81 mg Oral Daily  . enoxaparin (LOVENOX) injection  40 mg Subcutaneous Q24H  . guaiFENesin  1,200 mg Oral BID  . PARoxetine  40 mg Oral QHS  . psyllium  1 packet Oral Daily  . traZODone  50 mg Oral QHS   Continuous Infusions: . sodium chloride 10 mL/hr at 10/06/20 0427     LOS: 0 days    Time spent: over 30 min    Fayrene Helper, MD [facility]   To contact the attending provider between 7A-7P or the covering provider during after hours 7P-7A, please log into the web site [email or web address] and access using universal [place] password for that web site. If you do not have the password, please call the hospital operator.  10/06/2020, 7:14 PM

## 2020-10-06 NOTE — Consult Note (Signed)
CARDIOLOGY CONSULT NOTE  Patient ID: Steven Cain MRN: [id number] DOB/AGE: 01-15-1931 87 y.o.  Admit date: 10/05/2020 Attending physician: Elodia Florence., * Primary Physician:  Shon Baton, MD Outpatient Cardiologist: NA Inpatient Cardiologist: Rex Kras, DO, [facility], [facility], [facility]  Chief complaint: Seizures  Reason of consultation: Bradycardia and heart block Requesting physician: Dr. [name]   HPI:  Steven Cain is a 87 y.o. 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  Patient is accompanied by his wife Steven Cain) at bedside and his daughter works as a [employer] in our [place].  Patient lives with his family and yesterday was found to have seizure-like activities involved brought to the ER via EMS for further evaluation.  He is currently being managed by primary team and neurology and overnight he had episodes of bradycardia and per nursing staff a brief episode of torsades which transition to second-degree AV block.  Cardiology is consulted for further recommendations.  Patient's wife is present at bedside states that he does not have any significant cardiac history except risk factors as noted above.  He does not follow-up with cardiology on an outpatient basis he is noted to have symptoms of being tired, fatigue, lightheadedness, dizziness while at home but he is never experienced syncope.   I am unable to locate the rhythm strip from this morning to see if he truly had torsades.  He was given magnesium IV supplementation at that time.  Telemetry notes a ventricular rate between 35-40 bpm with what appears to be  2-1 AV block difficult to discern if it is type I or type II.   ALLERGIES: Allergies  Allergen Reactions   Other Other (See Comments)    Bee stings - unknown    PAST MEDICAL HISTORY: Past Medical History:  Diagnosis Date   Abnormal EKG    hx of left anterior fasicular block on ekg, no cardiologist   Anxiety    AR (aortic regurgitation) 03/23/2017   Mild, Noted on ECHO   Arthritis    DJD   Benign enlargement of prostate    frequent UTI, trim of prostate done , with some issues of incontinence now.   Chronic bronchitis (HCC)    chronic- usually has phelgm often   COVID-19 08/2019   fatigue and loss of taste and smell x 10 days did monoclonal antibody therapy   Depression    Dyspnea    with excertion   Fluid retention in legs    resolved   GERD (gastroesophageal reflux disease)    rare   GI bleed    Grade I diastolic dysfunction    Hard of hearing    hear more on left side- no hearing aid   History of colon polyps    History of transfusion 2016   Hydronephrosis 2018   Left ckd stage 3 no nephrologist per wife adele   Hypertension    Moderate concentric left ventricular hypertrophy 03/23/2017   Noted on ECHO   Perforated, severe stomach ulcer ([place]) 1974   Peripheral neuropathy    feet and toes   Spinal stenosis    TR (tricuspid regurgitation) 03/23/2017   Mild, Noted on ECHO   Transfusion history  none recent   Ureteral stricture    CHRONIC   Urinary incontinence    UTI (urinary tract infection)    frequent   Wears glasses     PAST SURGICAL HISTORY: Past Surgical History:  Procedure Laterality Date   ABDOMINAL SURGERY     bilroths tube '74   COLONOSCOPY N/A 09/29/2014   Procedure: COLONOSCOPY;  Surgeon: Beryle Beams, MD;  Location: [facility];  Service: Endoscopy;  Laterality: N/A;   CYSTOSCOPY W/ URETERAL STENT PLACEMENT Left 05/06/2014   Procedure: CYSTOSCOPY WITH RETROGRADE PYELOGRAM/URETERAL STENT PLACEMENT;   Surgeon: [name], MD;  Location: [facility];  Service: Urology;  Laterality: Left;   CYSTOSCOPY W/ URETERAL STENT PLACEMENT Left 03/31/2015   Procedure: CYSTOSCOPY WITH LEFT RETROGRADE PYELOGRAM/URETERAL STENT EXCHANGE;  Surgeon: [name], MD;  Location: [facility];  Service: Urology;  Laterality: Left;   CYSTOSCOPY W/ URETERAL STENT PLACEMENT Left 09/15/2015   Procedure: CYSTOSCOPY WITH LEFT RETROGRADE PYELOGRAM/URETERAL STENT EXCHANGE ;  Surgeon: [name], MD;  Location: [facility];  Service: Urology;  Laterality: Left;   CYSTOSCOPY W/ URETERAL STENT PLACEMENT Left 04/28/2016   Procedure: CYSTOSCOPY WITH RETROGRADE PYELOGRAM/URETERAL STENT EXCHANGE;  Surgeon: [name], MD;  Location: [facility];  Service: Urology;  Laterality: Left;   CYSTOSCOPY W/ URETERAL STENT PLACEMENT Left 11/08/2016   Procedure: CYSTOSCOPY WITH RETROGRADE PYELOGRAM/URETERAL STENT EXCHANGE;  Surgeon: [name], MD;  Location: [facility];  Service: Urology;  Laterality: Left;   CYSTOSCOPY W/ URETERAL STENT PLACEMENT Left 09/14/2017   Procedure: CYSTOSCOPY WITH RETROGRADE PYELOGRAM/URETERAL STENT PLACEMENT;  Surgeon: [name], MD;  Location: [facility];  Service: Urology;  Laterality: Left;   CYSTOSCOPY W/ URETERAL STENT PLACEMENT Left 04/10/2018   Procedure: CYSTOSCOPY WITH LEFT RETROGRADE URETERAL STENT EXCHANGE;  Surgeon: [name], MD;  Location: [facility];  Service: Urology;  Laterality: Left;   CYSTOSCOPY W/ URETERAL STENT PLACEMENT Left 01/31/2019   Procedure: CYSTOSCOPY WITH RETROGRADE PYELOGRAM/URETERAL STENT PLACEMENT;  Surgeon: [name], MD;  Location: [facility];  Service: Urology;  Laterality: Left;  [place] PLACEMENT Left 01/16/2020   Procedure: CYSTOSCOPY WITH RETROGRADE PYELOGRAM/URETERAL STENT EXCHANGED;  Surgeon: [name], MD;  Location: [facility];  Service: Urology;  Laterality: Left;   CYSTOSCOPY WITH RETROGRADE  PYELOGRAM, URETEROSCOPY AND STENT PLACEMENT Left 10/21/2014   Procedure: CYSTOSCOPY WITH RETROGRADE PYELOGRAM, URETEROSCOPY,BIOPSY AND STENT CHANGE;  Surgeon: [name], MD;  Location: [facility];  Service: Urology;  Laterality: Left;   CYSTOSCOPY/RETROGRADE/URETEROSCOPY Left 06/03/2014   Procedure: CYSTOSCOPY/RETROGRADE/URETEROSCOPY WITH BIOPSY,STENT EXCHANGE;  Surgeon: [name], MD;  Location: [facility];  Service: Urology;  Laterality: Left;   ESOPHAGOGASTRODUODENOSCOPY N/A 09/28/2014   Procedure: ESOPHAGOGASTRODUODENOSCOPY (EGD);  Surgeon: Beryle Beams, MD;  Location: [facility];  Service: Endoscopy;  Laterality: N/A;   GASTRIC ROUX-EN-Y     '74   HERNIA REPAIR     bilateral hernia repairs    LAPAROSCOPIC APPENDECTOMY N/A 02/15/2020   Procedure: APPENDECTOMY LAPAROSCOPIC;  Surgeon: Jovita Kussmaul, MD;  Location: [facility];  Service: General;  Laterality: N/A;   TONSILLECTOMY  as child   TOTAL HIP ARTHROPLASTY Left 05/19/2016   Procedure: LEFT TOTAL HIP ARTHROPLASTY ANTERIOR APPROACH;  Surgeon: Mcarthur Rossetti, MD;  Location: [facility];  Service: Orthopedics;  Laterality: Left;   TRANSURETHRAL RESECTION OF PROSTATE     VAGOTOMY  1950's    FAMILY HISTORY: The patient family history is not on file.  No family history of premature coronary disease  SOCIAL HISTORY:  The patient  reports that he has quit smoking. His smoking use included cigarettes. He has quit using smokeless tobacco.  His smokeless tobacco use included chew. He reports previous alcohol use. He reports that he does not use drugs.  MEDICATIONS: Current Outpatient Medications  Medication Instructions   acetaminophen (TYLENOL) 500 mg, Oral, Every 6 hours PRN   albuterol (VENTOLIN HFA) 108 (90 Base) MCG/ACT inhaler 2 puffs, Inhalation, Every 6 hours PRN   amLODipine (NORVASC) 10 mg, Oral, Daily   aspirin EC 81 mg, Oral, Daily   cyanocobalamin ((VITAMIN B-12)) 1,000 mcg, Intramuscular, Every 30 days    fish oil-omega-3 fatty acids 1 g, Oral, 2 times weekly   glycerin adult 2 g suppository 1 suppository, Rectal, As needed   guaiFENesin (MUCINEX) 1,200 mg, Oral, 2 times daily   hydroxypropyl methylcellulose / hypromellose (ISOPTO TEARS / GONIOVISC) 2.5 % ophthalmic solution 1 drop, Both Eyes, Daily PRN   Magnesium 100 mg, Oral, Daily   meclizine (ANTIVERT) 25 mg, Oral, Daily PRN   Misc Natural Products (NEURIVA PO) 1 capsule, Oral, Daily   Multiple Vitamin (MULTIVITAMIN WITH MINERALS) TABS tablet 1 tablet, Oral, BH-each morning   PARoxetine (PAXIL) 40 mg, Oral, Daily   psyllium (HYDROCIL/METAMUCIL) 95 % PACK 1 packet, Oral, Daily   senna-docusate (SENOKOT-S) 8.6-50 MG tablet 1 tablet, Oral, Daily at bedtime   traMADol (ULTRAM) 50 mg, Oral, Every 6 hours PRN   traZODone (DESYREL) 50 mg, Oral, Daily at bedtime   vitamin C 2,000 mg, Oral, Daily   Vitamin D 4,000 Units, Oral, Daily   vitamin E 100 Units, Oral, Every other day    REVIEW OF SYSTEMS: Review of Systems  Constitutional: Positive for malaise/fatigue. Negative for chills and fever.  HENT: Negative for hoarse voice and nosebleeds.   Eyes: Negative for discharge, double vision and pain.  Cardiovascular: Negative for chest pain, claudication, dyspnea on exertion, leg swelling, near-syncope, orthopnea, palpitations, paroxysmal nocturnal dyspnea and syncope.  Respiratory: Negative for hemoptysis and shortness of breath.   Musculoskeletal: Negative for muscle cramps and myalgias.  Gastrointestinal: Negative for abdominal pain, constipation, diarrhea, hematemesis, hematochezia, melena, nausea and vomiting.  Neurological: Positive for dizziness, headaches and light-headedness.  All other systems reviewed and are negative.   PHYSICAL EXAM: Vitals with BMI 10/06/2020 10/06/2020 10/06/2020  Height - - -  Weight - - -  BMI - - -  Systolic 073 710 626  Diastolic 51 49 77  Pulse 50 41 68     Intake/Output Summary (Last  24 hours) at 10/06/2020 0846 Last data filed at 10/06/2020 9485 Gross per 24 hour  Intake 1177.34 ml  Output 1050 ml  Net 127.34 ml    Net IO Since Admission: 127.34 mL [10/06/20 0846]  CONSTITUTIONAL: Appears older than stated age, hemodynamically stable, no acute distress, sleeping but easily arousable.  SKIN: Skin is warm and dry. No rash noted. No cyanosis. No pallor. No jaundice HEAD: Normocephalic and atraumatic.  EYES: No scleral icterus MOUTH/THROAT: Dry oral membranes.  NECK: No JVD present. No thyromegaly noted. No carotid bruits  LYMPHATIC: No visible cervical adenopathy.  CHEST Normal respiratory effort. No intercostal retractions  LUNGS: Clear to auscultation bilaterally.  No stridor.  Expiratory wheezes. No rales.  CARDIOVASCULAR: Regular, bradycardic, positive I6-E7, soft diastolic murmur heard at the right intercostal space, no gallops or rubs ABDOMINAL: Obese, soft, nontender, distended, positive bowel sounds, no apparent ascites.  EXTREMITIES: No peripheral edema, 2+ dorsalis pedis and posterior tibial pulses bilaterally, warm  to touch HEMATOLOGIC: No significant bruising NEUROLOGIC: Oriented to person, place, and time. Nonfocal. Normal muscle tone.  PSYCHIATRIC: Normal mood and affect. Normal behavior. Cooperative  RADIOLOGY: DG Chest 1 View  Result Date: 10/05/2020 CLINICAL DATA:  Concern for pneumonia EXAM: CHEST  1 VIEW COMPARISON:  Chest radiograph June 11, 2018 FINDINGS: The patient is rotated. The heart size and mediastinal contours are within normal limits. Low lung volumes with bibasilar subsegmental atelectasis superimposed on chronic bronchitic changes. Possible small right pleural effusion. Increased globular calcifications about the left shoulder. Left greater than right glenohumeral joint arthropathy. Surgical clips overlie the upper abdomen. IMPRESSION: 1. Low lung volumes with bibasilar subsegmental atelectasis superimposed on chronic bronchitic changes.  No focal consolidation. 2. Possible small right pleural effusion versus scarring. Electronically Signed   By: Dahlia Bailiff MD   On: 10/05/2020 17:12   CT HEAD WO CONTRAST  Result Date: 10/05/2020 CLINICAL DATA:  Delirium, seizure EXAM: CT HEAD WITHOUT CONTRAST TECHNIQUE: Contiguous axial images were obtained from the base of the skull through the vertex without intravenous contrast. COMPARISON:  None. FINDINGS: Brain: No acute infarct or intracranial hemorrhage. No mass lesion. No midline shift, ventriculomegaly or extra-axial fluid collection. Chronic right cerebellar lacunar insult. Vascular: No hyperdense vessel or unexpected calcification. Bilateral skull base atherosclerotic calcifications. Skull: Negative for fracture or focal lesion. Sinuses/Orbits: No acute finding. Mild left maxillary sinus hyperostosis. Clear paranasal sinuses. Other: None. IMPRESSION: No acute intracranial process. Chronic right cerebellar lacunar insult. Electronically Signed   By: Primitivo Gauze M.D.   On: 10/05/2020 10:11   MR BRAIN WO CONTRAST  Result Date: 10/05/2020 CLINICAL DATA:  Seizure, abnormal neuro exam EXAM: MRI HEAD WITHOUT CONTRAST TECHNIQUE: Multiplanar, multiecho pulse sequences of the brain and surrounding structures were obtained without intravenous contrast. COMPARISON:  10/05/2020. FINDINGS: Brain: Mild cortically based DWI hyperintensity involving the right parietal and occipitotemporal regions. No intracranial hemorrhage. No midline shift, ventriculomegaly or extra-axial fluid collection. No mass lesion. Chronic right cerebellar insult. Mild cerebral atrophy with ex vacuo dilatation. Vascular: Right V4 segment T2 hyperintense signal. Remaining intracranial major arterial flow voids are proximally preserved. Skull and upper cervical spine: Normal marrow signal. Sinuses/Orbits: Normal orbits. Clear paranasal sinuses. Pneumatized mastoid air cells. Other: Please note image quality is mildly degraded by  motion artifact. IMPRESSION: Mild cortically based DWI hyperintensity involving the right parietal and occipitotemporal regions may reflect postictal sequela. No intracranial hemorrhage.  Chronic right cerebellar insult. Right V4 segment hyperintensity, slow flow versus chronic occlusion. Electronically Signed   By: Primitivo Gauze M.D.   On: 10/05/2020 13:27    LABORATORY DATA: Lab Results  Component Value Date   WBC 8.4 10/06/2020   HGB 10.9 (L) 10/06/2020   HCT 32.3 (L) 10/06/2020   MCV 85.0 10/06/2020   PLT 181 10/06/2020    Recent Labs  Lab 10/06/20 0339  NA 138  K 3.8  CL 104  CO2 26  BUN 18  CREATININE 1.58*  CALCIUM 8.9  PROT 5.6*  BILITOT 0.4  ALKPHOS 69  ALT 11  AST 22  GLUCOSE 113*    Lipid Panel  No results found for: CHOL, TRIG, HDL, CHOLHDL, VLDL, LDLCALC  BNP (last 3 results) No results for input(s): BNP in the last 8760 hours.  HEMOGLOBIN A1C No results found for: HGBA1C, MPG  Cardiac Panel (last 3 results) No results for input(s): CKTOTAL, CKMB, RELINDX in the last 8760 hours.  Invalid input(s): TROPONINHS  No results found for: CKTOTAL, CKMB, CKMBINDEX  TSH No results for input(s): TSH in the last 8760 hours.    CARDIAC DATABASE: EKG: 10/05/2020: Normal sinus rhythm, 80 bpm, left axis deviation, left anterior fascicular block, prolonged first-degree AV block, poor R wave progression, LVH per voltage criteria, without underlying injury pattern.  10/06/2020: Sinus bradycardia, 37 bpm, first-degree AV block with a 2-1 conduction suggestive of second-degree AV block, left anterior fascicular block and nonspecific T wave abnormality without underlying injury pattern.  Echocardiogram: 03/23/2017: LVEF 55-60%, moderate LVH, grade 1 diastolic impairment, elevated LVEDP, mild AR, mild TR, no pericardial effusion.  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  Impression: Bradycardia 2-1 second-degree AV block First-degree AV block Left anterior fascicular block Hypertension Diabetes mellitus Hyperlipidemia Former smoker Stage IV prostate cancer with metastasis to the bone  Plan: Cardiology was consulted during his hospitalization to further evaluate his bradycardia.  At home he has been experiencing lightheadedness, dizziness, generalized tired and fatigue which may be contributory to his underlying conduction disease and/or also nonspecific findings.  TSH pending.  Medications reviewed no known identifiable reversible cause.  Overnight telemetry reviewed which notes episode of nonsustained ventricular tachycardia and a brief episode of what appears to be torsades while he is asleep and postictal given his recent seizure.  Magnesium was replaced overnight.  Recommend a magnesium of 2 and a potassium of 4.  Patient has history of conduction disease as prior EKGs notes left anterior fascicular block and prolonged AV block.  EKG performed earlier this morning notes 2-1 AV conduction difficult to ascertain if it is type I or type II AV block.  The QRS complex is narrow which suggest possibly it is not infrahisan.  We will consult cardiac electrophysiology for further recommendations given his progressive conduction disease and if pacemaker is warranted. I suspect he will benefit from AV blockers if he continues to have NSVT.   Echocardiogram pending. He will need ischemic evaluation in the near future.   Hold amlodipine for now.  Plan of care discussed with his wife who was present at bedside.   Total encounter time 85 minutes. *Total Encounter Time as defined by the Centers for Medicare  and Medicaid Services includes, in addition to the face-to-face time of a patient visit (documented in the note above) non-face-to-face time: obtaining and reviewing outside history, ordering and reviewing medications, tests or procedures, care coordination (communications with other health care professionals or caregivers) and documentation in the medical record.  Patient's questions and concerns were addressed to his satisfaction. He voices understanding of the instructions provided during this encounter.   This note was created using a voice recognition software as a result there may be grammatical errors inadvertently enclosed that do not reflect the nature of this encounter. Every attempt is made to correct such errors.  Mechele Claude [facility]  Pager: [phone] Office: [phone] 10/06/2020, 8:46 AM

## 2020-10-06 NOTE — Consult Note (Addendum)
ELECTROPHYSIOLOGY CONSULT NOTE    Patient ID: Steven Cain MRN: [id number], DOB/AGE: 85-Oct-1932 85 y.o.  Admit date: 10/05/2020 Date of Consult: 10/06/2020  Primary Physician: Shon Baton, MD Primary Cardiologist: No primary care provider on file.  Electrophysiologist: New  Referring Provider: Dr Terri Skains  Patient Profile: Steven Cain is a 87 y.o. male with a history of seizure disorder (quiescent x 30 years prior to this admit), CKD IIIb, HTN, Chronic bronchitis, GERD,and metastatic prostate cancer to the bone who is being seen today for the evaluation of heart block at the request of Dr. Terri Skains  Pt had COVID approx 1 month ago.   HPI:  Steven Cain is a 87 y.o. male with medical history as above who presented to [facility] after a seizure.   Shortly after waking up yesterday, he got up to go to the bathroom when he had an episode of seizure like activity. Initially he was "simply staring at the wall followed by tensing of the muscles and a brief period of what sounds to be tonic-clinic activity". This lasted approximately a minute. The episodes was witnessed by his granddaughter who is a PA in the ER. He had feeble pulses and shallow breathing immediately post, as well as extreme lethargy to follow.  No reports of tongue biting, or incontinence. Due to h/o prostate CA, MRI was performed which showed no evidence of metastatic disease or stroke.   EKG on arrival shows NSR at 80 bpm with significantly prolonged PR interval. Baseline EKG 01/2020 shows NSR in 60s with severe PR prolongation again.   Pertinent labs included WBC 12.4, Hgb 13.3, K 4.6, Cr 1.86 (near baseline), COVID negative. UA with Nitrites, large leukocytes, and few bacteria.   Mg 2.7 this am. HS trop 64 -> pending. TSH pending.  Overnight patient was monitored on tele and noted to have bradycardia into the 30s with occasional missed beats. Reported "torsades" does not appear to be true torsades with R waves that march  through. Mostly Mobitz 1 heart block with PR lengthening.   Echo pending. (EF 55/60% 03/2017)  Pt is awake and alert with his wife at bedside. She states at baseline patient is assisted for his ADLs. She helps him shower, and after most showers he takes he has episodes of near syncope. She occasionally uses smelling salts on him. Pt had a "vagotomy and pyloroplasty" in the 1964, and she wonders how this would affect his vagal tone.  He has history of UTIs.   Pt was transiently sleepy during the night post seizure yesterday. The events on tele as above did not seem produce any specific symptoms. At times he was sleepy or asleep, at other times he was awake and talking during.   Past Medical History:  Diagnosis Date  . Abnormal EKG    hx of left anterior fasicular block on ekg, no cardiologist  . Anxiety   . AR (aortic regurgitation) 03/23/2017   Mild, Noted on ECHO  . Arthritis    DJD  . Benign enlargement of prostate    frequent UTI, trim of prostate done , with some issues of incontinence now.  . Chronic bronchitis ([place])    chronic- usually has phelgm often  . COVID-19 08/2019   fatigue and loss of taste and smell x 10 days did monoclonal antibody therapy  . Depression   . Dyspnea    with excertion  . Fluid retention in legs    resolved  . GERD (gastroesophageal reflux disease)  rare  . GI bleed   . Grade I diastolic dysfunction   . Hard of hearing    hear more on left side- no hearing aid  . History of colon polyps   . History of transfusion 2016  . Hydronephrosis 2018   Left ckd stage 3 no nephrologist per wife adele  . Hypertension   . Moderate concentric left ventricular hypertrophy 03/23/2017   Noted on ECHO  . Perforated, severe stomach ulcer ([place]) 1974  . Peripheral neuropathy    feet and toes  . Spinal stenosis   . TR (tricuspid regurgitation) 03/23/2017   Mild, Noted on ECHO  . Transfusion history    none recent  . Ureteral stricture    CHRONIC  . Urinary  incontinence   . UTI (urinary tract infection)    frequent  . Wears glasses      Surgical History:  Past Surgical History:  Procedure Laterality Date  . ABDOMINAL SURGERY     bilroths tube '74  . COLONOSCOPY N/A 09/29/2014   Procedure: COLONOSCOPY;  Surgeon: Beryle Beams, MD;  Location: [facility];  Service: Endoscopy;  Laterality: N/A;  . CYSTOSCOPY W/ URETERAL STENT PLACEMENT Left 05/06/2014   Procedure: CYSTOSCOPY WITH RETROGRADE PYELOGRAM/URETERAL STENT PLACEMENT;  Surgeon: [name], MD;  Location: [facility];  Service: Urology;  Laterality: Left;  . CYSTOSCOPY W/ URETERAL STENT PLACEMENT Left 03/31/2015   Procedure: CYSTOSCOPY WITH LEFT RETROGRADE PYELOGRAM/URETERAL STENT EXCHANGE;  Surgeon: [name], MD;  Location: [facility];  Service: Urology;  Laterality: Left;  . CYSTOSCOPY W/ URETERAL STENT PLACEMENT Left 09/15/2015   Procedure: CYSTOSCOPY WITH LEFT RETROGRADE PYELOGRAM/URETERAL STENT EXCHANGE ;  Surgeon: [name], MD;  Location: [facility];  Service: Urology;  Laterality: Left;  . CYSTOSCOPY W/ URETERAL STENT PLACEMENT Left 04/28/2016   Procedure: CYSTOSCOPY WITH RETROGRADE PYELOGRAM/URETERAL STENT EXCHANGE;  Surgeon: [name], MD;  Location: [facility];  Service: Urology;  Laterality: Left;  . CYSTOSCOPY W/ URETERAL STENT PLACEMENT Left 11/08/2016   Procedure: CYSTOSCOPY WITH RETROGRADE PYELOGRAM/URETERAL STENT EXCHANGE;  Surgeon: [name], MD;  Location: [facility];  Service: Urology;  Laterality: Left;  . CYSTOSCOPY W/ URETERAL STENT PLACEMENT Left 09/14/2017   Procedure: CYSTOSCOPY WITH RETROGRADE PYELOGRAM/URETERAL STENT PLACEMENT;  Surgeon: [name], MD;  Location: [facility];  Service: Urology;  Laterality: Left;  . CYSTOSCOPY W/ URETERAL STENT PLACEMENT Left 04/10/2018   Procedure: CYSTOSCOPY WITH LEFT RETROGRADE URETERAL STENT EXCHANGE;  Surgeon: [name], MD;  Location: [facility];  Service: Urology;  Laterality: Left;  . CYSTOSCOPY W/  URETERAL STENT PLACEMENT Left 01/31/2019   Procedure: CYSTOSCOPY WITH RETROGRADE PYELOGRAM/URETERAL STENT PLACEMENT;  Surgeon: [name], MD;  Location: [facility];  Service: Urology;  Laterality: Left;  45 MINS  . CYSTOSCOPY W/ URETERAL STENT PLACEMENT Left 01/16/2020   Procedure: CYSTOSCOPY WITH RETROGRADE PYELOGRAM/URETERAL STENT EXCHANGED;  Surgeon: [name], MD;  Location: [facility];  Service: Urology;  Laterality: Left;  . CYSTOSCOPY WITH RETROGRADE PYELOGRAM, URETEROSCOPY AND STENT PLACEMENT Left 10/21/2014   Procedure: CYSTOSCOPY WITH RETROGRADE PYELOGRAM, URETEROSCOPY,BIOPSY AND STENT CHANGE;  Surgeon: [name], MD;  Location: [facility];  Service: Urology;  Laterality: Left;  . CYSTOSCOPY/RETROGRADE/URETEROSCOPY Left 06/03/2014   Procedure: CYSTOSCOPY/RETROGRADE/URETEROSCOPY WITH BIOPSY,STENT EXCHANGE;  Surgeon: [name], MD;  Location: [facility];  Service: Urology;  Laterality: Left;  . ESOPHAGOGASTRODUODENOSCOPY N/A 09/28/2014   Procedure: ESOPHAGOGASTRODUODENOSCOPY (EGD);  Surgeon: Beryle Beams, MD;  Location: [facility];  Service: Endoscopy;  Laterality: N/A;  . GASTRIC  ROUX-EN-Y     '74  . HERNIA REPAIR     bilateral hernia repairs   . LAPAROSCOPIC APPENDECTOMY N/A 02/15/2020   Procedure: APPENDECTOMY LAPAROSCOPIC;  Surgeon: Jovita Kussmaul, MD;  Location: [facility];  Service: General;  Laterality: N/A;  . TONSILLECTOMY  as child  . TOTAL HIP ARTHROPLASTY Left 05/19/2016   Procedure: LEFT TOTAL HIP ARTHROPLASTY ANTERIOR APPROACH;  Surgeon: Mcarthur Rossetti, MD;  Location: [facility];  Service: Orthopedics;  Laterality: Left;  . TRANSURETHRAL RESECTION OF PROSTATE    . VAGOTOMY  1950's     Medications Prior to Admission  Medication Sig Dispense Refill Last Dose  . acetaminophen (TYLENOL) 500 MG tablet Take 1 tablet (500 mg total) by mouth every 6 (six) hours as needed for mild pain.   Past Week at Unknown time  . albuterol (VENTOLIN HFA) 108 (90 Base)  MCG/ACT inhaler Inhale 2 puffs into the lungs every 6 (six) hours as needed for wheezing or shortness of breath.   10/05/2020 at Unknown time  . amLODipine (NORVASC) 10 MG tablet Take 1 tablet (10 mg total) by mouth daily. 30 tablet 11 10/05/2020 at Unknown time  . Ascorbic Acid (VITAMIN C) 1000 MG tablet Take 2,000 mg by mouth daily.    10/05/2020 at Unknown time  . aspirin EC 81 MG tablet Take 81 mg by mouth daily.   10/05/2020 at Unknown time  . Cholecalciferol (VITAMIN D) 50 MCG (2000 UT) CAPS Take 4,000 Units by mouth daily.   10/05/2020 at Unknown time  . cyanocobalamin (,VITAMIN B-12,) 1000 MCG/ML injection Inject 1,000 mcg into the muscle every 30 (thirty) days.   09/21/2020  . fish oil-omega-3 fatty acids 1000 MG capsule Take 1 g by mouth 2 (two) times a week.    Past Week at Unknown time  . guaiFENesin (MUCINEX) 600 MG 12 hr tablet Take 1,200 mg by mouth 2 (two) times daily.    10/05/2020 at Unknown time  . hydroxypropyl methylcellulose / hypromellose (ISOPTO TEARS / GONIOVISC) 2.5 % ophthalmic solution Place 1 drop into both eyes daily as needed for dry eyes.   09/21/2020  . Magnesium 100 MG TABS Take 100 mg by mouth daily.   Past Week at Unknown time  . meclizine (ANTIVERT) 25 MG tablet Take 25 mg by mouth daily as needed for dizziness.   10/03/2020 at Unknown time  . Misc Natural Products (NEURIVA PO) Take 1 capsule by mouth daily.   10/05/2020 at Unknown time  . Multiple Vitamin (MULTIVITAMIN WITH MINERALS) TABS tablet Take 1 tablet by mouth every morning.   10/05/2020 at Unknown time  . PARoxetine (PAXIL) 40 MG tablet Take 1 tablet (40 mg total) by mouth daily. (Patient taking differently: Take 40 mg by mouth at bedtime.) 30 tablet 11 10/04/2020 at Unknown time  . psyllium (HYDROCIL/METAMUCIL) 95 % PACK Take 1 packet by mouth daily.    10/05/2020 at Unknown time  . senna-docusate (SENOKOT-S) 8.6-50 MG tablet Take 1 tablet by mouth at bedtime. (Patient taking differently: Take 2 tablets by mouth at  bedtime as needed for mild constipation.) 30 tablet 0 10/04/2020 at Unknown time  . traZODone (DESYREL) 50 MG tablet Take 50 mg by mouth at bedtime.    10/03/2020  . vitamin E 100 UNIT capsule Take 100 Units by mouth every other day.   10/04/2020 at Unknown time  . glycerin adult 2 g suppository Place 1 suppository rectally as needed for constipation. (Patient not taking: No sig reported)  Not Taking at Unknown time  . traMADol (ULTRAM) 50 MG tablet Take 1 tablet (50 mg total) by mouth every 6 (six) hours as needed for moderate pain or severe pain. (Patient not taking: No sig reported) 15 tablet 0 Not Taking at Unknown time    Inpatient Medications:  . aspirin EC  81 mg Oral Daily  . enoxaparin (LOVENOX) injection  40 mg Subcutaneous Q24H  . guaiFENesin  1,200 mg Oral BID  . levETIRAcetam  500 mg Oral BID  . PARoxetine  40 mg Oral QHS  . psyllium  1 packet Oral Daily  . traZODone  50 mg Oral QHS    Allergies:  Allergies  Allergen Reactions  . Other Other (See Comments)    Bee stings - unknown    Social History   Socioeconomic History  . Marital status: Married    Spouse name: Not on file  . Number of children: Not on file  . Years of education: Not on file  . Highest education level: Not on file  Occupational History  . Not on file  Tobacco Use  . Smoking status: Former Smoker    Types: Cigarettes  . Smokeless tobacco: Former Systems developer    Types: Chew  . Tobacco comment: quit yrs ago  Vaping Use  . Vaping Use: Never used  Substance and Sexual Activity  . Alcohol use: Not Currently  . Drug use: No  . Sexual activity: Not Currently  Other Topics Concern  . Not on file  Social History Narrative  . Not on file   Social Determinants of Health   Financial Resource Strain: Not on file  Food Insecurity: Not on file  Transportation Needs: Not on file  Physical Activity: Not on file  Stress: Not on file  Social Connections: Not on file  Intimate Partner Violence: Not on file      Family History  Problem Relation Age of Onset  . Heart disease Neg Hx      Review of Systems: All other systems reviewed and are otherwise negative except as noted above.  Physical Exam: Vitals:   10/05/20 2337 10/06/20 0330 10/06/20 0521 10/06/20 0810  BP: (!) 143/91 122/77 (!) 147/49 (!) 129/51  Pulse: (!) 53 68 (!) 41 (!) 50  Resp: 18 18 18 18   Temp: 98.2 F (36.8 C) 98 F (36.7 C) (!) 97.5 F (36.4 C) 97.7 F (36.5 C)  TempSrc: Oral Oral Oral Oral  SpO2: 96% 92% 94% 97%  Weight:      Height:        GEN- The patient is well appearing, alert and oriented x 3 today.   HEENT: normocephalic, atraumatic; sclera clear, conjunctiva pink; hearing intact; oropharynx clear; neck supple Lungs- Clear to ausculation bilaterally, normal work of breathing.  No wheezes, rales, rhonchi Heart- Regular rate and rhythm, no murmurs, rubs or gallops GI- soft, non-tender, non-distended, bowel sounds present Extremities- no clubbing, cyanosis, or edema; DP/PT/radial pulses 2+ bilaterally MS- no significant deformity or atrophy Skin- warm and dry, no rash or lesion Psych- euthymic mood, full affect Neuro- strength and sensation are intact  Labs:   Lab Results  Component Value Date   WBC 8.4 10/06/2020   HGB 10.9 (L) 10/06/2020   HCT 32.3 (L) 10/06/2020   MCV 85.0 10/06/2020   PLT 181 10/06/2020    Recent Labs  Lab 10/06/20 0339  NA 138  K 3.8  CL 104  CO2 26  BUN 18  CREATININE 1.58*  CALCIUM 8.9  PROT 5.6*  BILITOT 0.4  ALKPHOS 69  ALT 11  AST 22  GLUCOSE 113*      Radiology/Studies: DG Chest 1 View  Result Date: 10/05/2020 CLINICAL DATA:  Concern for pneumonia EXAM: CHEST  1 VIEW COMPARISON:  Chest radiograph June 11, 2018 FINDINGS: The patient is rotated. The heart size and mediastinal contours are within normal limits. Low lung volumes with bibasilar subsegmental atelectasis superimposed on chronic bronchitic changes. Possible small right pleural effusion.  Increased globular calcifications about the left shoulder. Left greater than right glenohumeral joint arthropathy. Surgical clips overlie the upper abdomen. IMPRESSION: 1. Low lung volumes with bibasilar subsegmental atelectasis superimposed on chronic bronchitic changes. No focal consolidation. 2. Possible small right pleural effusion versus scarring. Electronically Signed   By: Dahlia Bailiff MD   On: 10/05/2020 17:12   CT HEAD WO CONTRAST  Result Date: 10/05/2020 CLINICAL DATA:  Delirium, seizure EXAM: CT HEAD WITHOUT CONTRAST TECHNIQUE: Contiguous axial images were obtained from the base of the skull through the vertex without intravenous contrast. COMPARISON:  None. FINDINGS: Brain: No acute infarct or intracranial hemorrhage. No mass lesion. No midline shift, ventriculomegaly or extra-axial fluid collection. Chronic right cerebellar lacunar insult. Vascular: No hyperdense vessel or unexpected calcification. Bilateral skull base atherosclerotic calcifications. Skull: Negative for fracture or focal lesion. Sinuses/Orbits: No acute finding. Mild left maxillary sinus hyperostosis. Clear paranasal sinuses. Other: None. IMPRESSION: No acute intracranial process. Chronic right cerebellar lacunar insult. Electronically Signed   By: Primitivo Gauze M.D.   On: 10/05/2020 10:11   MR BRAIN WO CONTRAST  Result Date: 10/05/2020 CLINICAL DATA:  Seizure, abnormal neuro exam EXAM: MRI HEAD WITHOUT CONTRAST TECHNIQUE: Multiplanar, multiecho pulse sequences of the brain and surrounding structures were obtained without intravenous contrast. COMPARISON:  10/05/2020. FINDINGS: Brain: Mild cortically based DWI hyperintensity involving the right parietal and occipitotemporal regions. No intracranial hemorrhage. No midline shift, ventriculomegaly or extra-axial fluid collection. No mass lesion. Chronic right cerebellar insult. Mild cerebral atrophy with ex vacuo dilatation. Vascular: Right V4 segment T2 hyperintense  signal. Remaining intracranial major arterial flow voids are proximally preserved. Skull and upper cervical spine: Normal marrow signal. Sinuses/Orbits: Normal orbits. Clear paranasal sinuses. Pneumatized mastoid air cells. Other: Please note image quality is mildly degraded by motion artifact. IMPRESSION: Mild cortically based DWI hyperintensity involving the right parietal and occipitotemporal regions may reflect postictal sequela. No intracranial hemorrhage.  Chronic right cerebellar insult. Right V4 segment hyperintensity, slow flow versus chronic occlusion. Electronically Signed   By: Primitivo Gauze M.D.   On: 10/05/2020 13:27   EKG: EKG on arrival shows NSR at 80 bpm with significantly prolonged PR interval. Baseline EKG 01/2020 shows NSR in 60s with severe PR prolongation again.  (personally reviewed)  TELEMETRY: Sinus bradycardia in 30-40s with significantly prolonged PR interval and Mobitz I at least, at times more concerning for Mobitz II. (personally reviewed) Episode this am 62:70:35 shows cyclical baseline activity with clear R waves marching through Episode this am 07:03:57 again with moderate cyclical activity on baseline, again with clear R waves marching through Episode this am 07:04:08 more noisy, concerns raised for torsades, but again clear R waves are visible, especially on lead "V"  Assessment/Plan: 1.  Conduction disease Pt has a chronic history of significant 1st degree AV block (>350 ms).  He has clear Mobitz 1 on tele and ? Mobitz 2 at times.  No evidence of true VT/torsades on my review of tele and Elaysha Bevard review fully with MD.  Suspect this is  exacerbated with increased vagal tone in setting of seizure.  Baseline HRs have been 50-60s for years. Avoid AV nodal blocking agents.   2. Perceived VT on monitor No clear VT on monitor with R waves marching through.  Noise most likely; Pt and wife deny further seizure like activity associated with these events.   3. Stage  IV prostate CA with bone mets Pet scan 06/2020 with scattered osseous metastases including R humerus and ? L humerus. Left hip prosthesis with uptake but ddx includes loosening of the prosthesis vs metastatic disease. Wife states his prognosis is overall OK with this, Specifically, he has been told "You're not going to die from prostate cancer". MRI this admission with no brain involvement given recurrent stroke.   4. Seizure Per primary. ? Neurology.  ADDENDUM  Dr. Curt Bears has seen. He is now having clear Mobitz II block. We Nathaniel Yaden continue to watch as he recovers from his seizure. He is mostly bedbound at baseline.   For questions or updates, please contact [place] Please consult [email or web address] for contact info under Cardiology/STEMI.  Signed, Shirley Friar, PA-C  10/06/2020 9:10 AM  I have seen and examined this patient with Oda Kilts.  Agree with above, note added to reflect my findings.  On exam, bradycardic, regular, no murmurs.  Patient 2-1 AV block today.  Per his wife, he has no history of bradycardia.  She checks his blood pressures at home and heart rates are generally in the 50s to 60s.  He has also had Mobitz 1 AV block.  It is certainly possible that this is block in the AV node.  As he has not had any evidence of bradycardia prior to his seizure, he could have an increased vagal tone causing his heart block.  We Alassane Kalafut continue to monitor.  We Laiya Wisby check back tomorrow to see if he remains bradycardic.  Of note, review of telemetry does not show torsades or ventricular tachycardia.  Each episode is likely secondary to artifact.  Sherma Vanmetre M. Kaili Castille MD 10/06/2020 11:31 AM

## 2020-10-06 NOTE — Progress Notes (Signed)
EEG completed, results pending. 

## 2020-10-06 NOTE — Procedures (Signed)
Patient Name: Steven Cain  MRN: [id number]  Epilepsy Attending: Lora Havens  Referring Physician/Provider: Dr. Fayrene Helper Date: 10/06/2020 Duration: 23.30 minutes  Patient history: 87 year old male presented with seizure-like activity.  EEG to evaluate for seizures.  Level of alertness: Awake, asleep  AEDs during EEG study: Keppra  Technical aspects: This EEG study was done with scalp electrodes positioned according to the 10-20 International system of electrode placement. Electrical activity was acquired at a sampling rate of 500Hz  and reviewed with a high frequency filter of 70Hz  and a low frequency filter of 1Hz . EEG data were recorded continuously and digitally stored.   Description: The posterior dominant rhythm consists of 8 Hz activity of moderate voltage (25-35 uV) seen predominantly in posterior head regions, symmetric and reactive to eye opening and eye closing. Sleep was characterized by vertex waves, sleep spindles (12 to 14 Hz), maximal frontocentral region. Hyperventilation and photic stimulation were not performed.     IMPRESSION: This study is within normal limits. No seizures or epileptiform discharges were seen throughout the recording.  Melinda Pottinger Barbra Sarks

## 2020-10-06 NOTE — Progress Notes (Signed)
  Echocardiogram 2D Echocardiogram has been performed. Notified Dr. Terri Skains.  Fidel Levy 10/06/2020, 10:23 AM

## 2020-10-06 NOTE — Consult Note (Signed)
Neurology Consultation  Reason for Consult: Seizure Referring Physician: Dr. 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 His wife was at the bedside and provided the history that she was in the house, went to the bathroom, and the next that he was seen by family because he made a loud noise, and by the time family member came they thought that he was gurgling because of thick phlegm in his throat and appeared to have passed out.  There was a question of momentary loss of pulses. The daughter did not witness and confirmed that nobody witnessed tonic-clonic seizure activity. Information provided yesterday over the phone to me was that there was witnessed seizure activity but the daughter said that she was suspicious for seizure-like activity but actually did not witness 1.   In the hospital, his work-up has revealed bradycardia and cardiology has been consulted.  They are currently working him up for the bradycardia-his heart rate at the time of this encounter also was about 38-39.  He is noted to be in a 2-1 AV block-difficult to discern if it is type I or type II block.  Cardiology is concerned that this might be due to increased vagal tone from the seizure.  He has had a brain MRI-official reading concerning for some DWI changes in the right hemisphere but I discussed with neuroradiologist on-call-these are artifactual and his brain MRI does not show any acute changes or changes concerning for an ictal or a postictal  phenomenon. His EEG is also completely normal.     ROS: Obtained and negative except as noted in HPI.  Past Medical History:  Diagnosis Date  . Abnormal EKG    hx of left anterior fasicular block on ekg, no cardiologist  . Anxiety   . AR (aortic regurgitation) 03/23/2017   Mild, Noted on ECHO  . Arthritis    DJD  . Benign enlargement of prostate    frequent UTI, trim of prostate done , with some issues of incontinence now.  . Chronic bronchitis ([place])    chronic- usually has phelgm often  . COVID-19 08/2019   fatigue and loss of taste and smell x 10 days did monoclonal antibody therapy  . Depression   . Dyspnea    with excertion  . Fluid retention in legs    resolved  . GERD (gastroesophageal reflux disease)    rare  . GI bleed   . Grade I diastolic dysfunction   . Hard of hearing    hear more on left side- no hearing aid  . History of colon polyps   . History of transfusion 2016  . Hydronephrosis 2018   Left ckd stage 3 no nephrologist per wife ad[place]  . Hypertension   . Moderate concentric left ventricular hypertrophy 03/23/2017   Noted on ECHO  . Perforated, severe stomach ulcer ([place]) 1974  . Peripheral neuropathy    feet and toes  . Spinal stenosis   . TR (tricuspid regurgitation) 03/23/2017   Mild, Noted on ECHO  . Transfusion history    none recent  .  Ureteral stricture    CHRONIC  . Urinary incontinence   . UTI (urinary tract infection)    frequent  . Wears glasses      Family History  Problem Relation Age of Onset  . Heart disease Neg Hx     Social History:   reports that he has quit smoking. His smoking use included cigarettes. He has quit using smokeless tobacco.  His smokeless tobacco use included chew. He reports previous alcohol use. He reports that he does not use drugs.  Medications  Current Facility-Administered Medications:  .  0.9 %  sodium chloride infusion, , Intravenous, PRN, Shalhoub, Sherryll Burger, MD, Last Rate: 10 mL/hr at  10/06/20 0427, Infusion Verify at 10/06/20 0427 .  acetaminophen (TYLENOL) tablet 650 mg, 650 mg, Oral, Q6H PRN **OR** acetaminophen (TYLENOL) suppository 650 mg, 650 mg, Rectal, Q6H PRN, Shalhoub, Sherryll Burger, MD .  albuterol (PROVENTIL) (2.5 MG/3ML) 0.083% nebulizer solution 2.5 mg, 2.5 mg, Nebulization, Q6H PRN, Shalhoub, Sherryll Burger, MD, 2.5 mg at 10/05/20 2149 .  aspirin EC tablet 81 mg, 81 mg, Oral, Daily, Shalhoub, Sherryll Burger, MD, 81 mg at 10/06/20 1052 .  enoxaparin (LOVENOX) injection 40 mg, 40 mg, Subcutaneous, Q24H, Shalhoub, Sherryll Burger, MD, 40 mg at 10/05/20 1700 .  guaiFENesin (MUCINEX) 12 hr tablet 1,200 mg, 1,200 mg, Oral, BID, Shalhoub, Sherryll Burger, MD, 1,200 mg at 10/06/20 1051 .  hydroxypropyl methylcellulose / hypromellose (ISOPTO TEARS / GONIOVISC) 2.5 % ophthalmic solution 1 drop, 1 drop, Both Eyes, Daily PRN, Shalhoub, Sherryll Burger, MD .  levETIRAcetam (KEPPRA) tablet 500 mg, 500 mg, Oral, BID, Shalhoub, Sherryll Burger, MD, 500 mg at 10/06/20 1051 .  meclizine (ANTIVERT) tablet 25 mg, 25 mg, Oral, Daily PRN, Shalhoub, Sherryll Burger, MD .  ondansetron (ZOFRAN) tablet 4 mg, 4 mg, Oral, Q6H PRN **OR** ondansetron (ZOFRAN) injection 4 mg, 4 mg, Intravenous, Q6H PRN, Shalhoub, Sherryll Burger, MD .  PARoxetine (PAXIL) tablet 40 mg, 40 mg, Oral, QHS, Shalhoub, Sherryll Burger, MD, 40 mg at 10/05/20 2206 .  polyethylene glycol (MIRALAX / GLYCOLAX) packet 17 g, 17 g, Oral, Daily PRN, Shalhoub, Sherryll Burger, MD .  psyllium (HYDROCIL/METAMUCIL) 1 packet, 1 packet, Oral, Daily, Shalhoub, Sherryll Burger, MD, 1 packet at 10/06/20 1051 .  senna-docusate (Senokot-S) tablet 2 tablet, 2 tablet, Oral, QHS PRN, Cyd Silence, Sherryll Burger, MD .  traZODone (DESYREL) tablet 50 mg, 50 mg, Oral, QHS, Shalhoub, Sherryll Burger, MD, 50 mg at 10/05/20 2204   Exam: Current vital signs: BP (!) 153/53 (BP Location: Right Arm)   Pulse (!) 37   Temp 97.6 F (36.4 C) (Oral)   Resp 17   Ht 5\' 5"  (1.651 m)   Wt 86.2 kg   SpO2 97%   BMI 31.62 kg/m  Vital signs in  last 24 hours: Temp:  [97.5 F (36.4 C)-98.3 F (36.8 C)] 97.6 F (36.4 C) (02/23 1119) Pulse Rate:  [37-76] 37 (02/23 1119) Resp:  [17-20] 17 (02/23 1119) BP: (120-153)/(44-94) 153/53 (02/23 1119) SpO2:  [92 %-97 %] 97 % (02/23 1119) General: He is awake alert in no distress HEENT: Normocephalic atraumatic, edentulous Lungs: Clear Cardiovascular: Bradycardic Abdomen soft nondistended nontender Extremities warm well perfused Neurological exam He is awake alert oriented to self and the fact that he is in the hospital. His speech is mildly dysarthric-question if it is because of not wearing his dentures but the family does not seem to be very concerned about this and his wife thinks this is his [place] drawl. He  has no evidence of aphasia but has diminished attention and concentration Cranial nerves: Pupils equal round react light, extraocular movements intact, visual fields are full, facial sensation intact, face is symmetric, tongue and palate midline. Motor exam: Grossly antigravity all fours without drift. Sensory: Intact to light touch without extinction Coordination: Difficult to perform heel-knee-shin-has had balance issues for many years.  Labs I have reviewed labs in epic and the results pertinent to this consultation are:  CBC    Component Value Date/Time   WBC 8.4 10/06/2020 0339   RBC 3.80 (L) 10/06/2020 0339   HGB 10.9 (L) 10/06/2020 0339   HCT 32.3 (L) 10/06/2020 0339   PLT 181 10/06/2020 0339   MCV 85.0 10/06/2020 0339   MCH 28.7 10/06/2020 0339   MCHC 33.7 10/06/2020 0339   RDW 14.6 10/06/2020 0339   LYMPHSABS 2.1 10/06/2020 0339   MONOABS 0.6 10/06/2020 0339   EOSABS 0.7 (H) 10/06/2020 0339   BASOSABS 0.1 10/06/2020 0339    CMP     Component Value Date/Time   NA 138 10/06/2020 0339   K 3.8 10/06/2020 0339   CL 104 10/06/2020 0339   CO2 26 10/06/2020 0339   GLUCOSE 113 (H) 10/06/2020 0339   BUN 18 10/06/2020 0339   CREATININE 1.58 (H) 10/06/2020  0339   CREATININE 1.28 01/03/2012 1041   CALCIUM 8.9 10/06/2020 0339   PROT 5.6 (L) 10/06/2020 0339   ALBUMIN 2.7 (L) 10/06/2020 0339   AST 22 10/06/2020 0339   ALT 11 10/06/2020 0339   ALKPHOS 69 10/06/2020 0339   BILITOT 0.4 10/06/2020 0339   GFRNONAA 42 (L) 10/06/2020 0339   GFRAA 37 (L) 02/19/2020 0309   EEG-normal  Imaging I have reviewed the images obtained: CT-scan of the brain-no acute changes  MRI brain, although reported some DWI changes, are likely only artifactual.  No acute changes.  Discussed with second opinion on-call neuroradiology, who read.  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  There was no witnessed seizure but after the family saw him asking for help while trying to go to the bathroom and then him becoming unresponsive made them feel that he might have had a seizure. Work-up in the hospital has revealed bradycardia with AV block switch cardiology is currently working him up for. Description of his presentation does not sound fully convincing for a seizure. Prior seizures, last one was 37-year ago were in the setting of alcohol withdrawal/abuse. On my phone consultation yesterday with the ED provider, because of the description of tonic-clonic seizure, I recommended antiepileptics be started but on confirming with the family that no witnessed tonic-clonic activity was seen and now with the cardiac work-up revealing bradycardia and block, I suspect that his presentation might be because of the underlying cardiac etiology-convulsions in the setting of syncope and arrhythmia are not uncommon. I do not think at this time he needs any anticonvulsants.  Impression: -Seizure-like activity concern-likely secondary to syncopal convulsion or convulsions in the setting of cardiac arrhythmia -Less likely that he has underlying seizures at this time causing any cardiac abnormalities given  completely normal EEG as well as no evidence of ictal or postictal changes that can be sometimes seen on MRI. -Chronic balance issues -Multiple other comorbidities as listed in HPI  Recommendations: No antiepileptics at this time-I will discontinue Keppra. I do not think he has any seizures ongoing given the normal EEG and unremarkable acute MRI findings and I do not  think that those are the ones causing cardiac arrhythmias but rather a cardiac arrhythmia as an underlying etiology of the current presentation. Outpatient follow-up with [facility] neurology. I discussed my plan with Dr. Florene Glen over the phone. I also discussed the plan with his wife at bedside and his daughter Mortimer Bair, over the phone. Neurology will be available as needed. Please do not hesitate to call with questions.  -- Amie Portland, MD Neurologist [facility] Pager: [phone]

## 2020-10-06 NOTE — Significant Event (Signed)
Earlier in the night nurse notified that patient had a brief run of torsades so I ordered magnesium sulfate 2 g IV ordered magnesium levels troponin and basic metabolic panel.  Around 5:15 AM today patient's monitor was showing second-degree AV block.  EKG was ordered shows second-degree AV block likely type II Mobitz.  Patient blood pressure is 628 systolic heart rate is around 38 bpm on exam at bedside patient is denying any chest pain or shortness of breath.  Labs are pending.  Cardiology has been consulted.  Patient's family updated.  Patient medications reviewed.  [name]

## 2020-10-07 DIAGNOSIS — J42 Unspecified chronic bronchitis: Secondary | ICD-10-CM | POA: Diagnosis not present

## 2020-10-07 DIAGNOSIS — R55 Syncope and collapse: Secondary | ICD-10-CM

## 2020-10-07 DIAGNOSIS — I441 Atrioventricular block, second degree: Secondary | ICD-10-CM

## 2020-10-07 DIAGNOSIS — R569 Unspecified convulsions: Secondary | ICD-10-CM | POA: Diagnosis not present

## 2020-10-07 LAB — CBC WITH DIFFERENTIAL/PLATELET
Abs Immature Granulocytes: 0.02 10*3/uL (ref 0.00–0.07)
Basophils Absolute: 0.1 10*3/uL (ref 0.0–0.1)
Basophils Relative: 1 %
Eosinophils Absolute: 0.9 10*3/uL — ABNORMAL HIGH (ref 0.0–0.5)
Eosinophils Relative: 9 %
HCT: 37.7 % — ABNORMAL LOW (ref 39.0–52.0)
Hemoglobin: 12.2 g/dL — ABNORMAL LOW (ref 13.0–17.0)
Immature Granulocytes: 0 %
Lymphocytes Relative: 25 %
Lymphs Abs: 2.7 10*3/uL (ref 0.7–4.0)
MCH: 27.9 pg (ref 26.0–34.0)
MCHC: 32.4 g/dL (ref 30.0–36.0)
MCV: 86.1 fL (ref 80.0–100.0)
Monocytes Absolute: 0.8 10*3/uL (ref 0.1–1.0)
Monocytes Relative: 8 %
Neutro Abs: 6.2 10*3/uL (ref 1.7–7.7)
Neutrophils Relative %: 57 %
Platelets: 183 10*3/uL (ref 150–400)
RBC: 4.38 MIL/uL (ref 4.22–5.81)
RDW: 14.6 % (ref 11.5–15.5)
WBC: 10.8 10*3/uL — ABNORMAL HIGH (ref 4.0–10.5)
nRBC: 0 % (ref 0.0–0.2)

## 2020-10-07 LAB — COMPREHENSIVE METABOLIC PANEL
ALT: 12 U/L (ref 0–44)
AST: 19 U/L (ref 15–41)
Albumin: 3.1 g/dL — ABNORMAL LOW (ref 3.5–5.0)
Alkaline Phosphatase: 70 U/L (ref 38–126)
Anion gap: 11 (ref 5–15)
BUN: 19 mg/dL (ref 8–23)
CO2: 23 mmol/L (ref 22–32)
Calcium: 9.3 mg/dL (ref 8.9–10.3)
Chloride: 102 mmol/L (ref 98–111)
Creatinine, Ser: 1.69 mg/dL — ABNORMAL HIGH (ref 0.61–1.24)
GFR, Estimated: 39 mL/min — ABNORMAL LOW (ref >60)
Glucose, Bld: 100 mg/dL — ABNORMAL HIGH (ref 70–99)
Potassium: 4.5 mmol/L (ref 3.5–5.1)
Sodium: 136 mmol/L (ref 135–145)
Total Bilirubin: 0.6 mg/dL (ref 0.3–1.2)
Total Protein: 6.4 g/dL — ABNORMAL LOW (ref 6.5–8.1)

## 2020-10-07 LAB — ECHOCARDIOGRAM COMPLETE
Area-P 1/2: 2.87 cm2
Height: 65 in
S' Lateral: 2.7 cm
Weight: 3040.58 oz

## 2020-10-07 LAB — PHOSPHORUS: Phosphorus: 3.4 mg/dL (ref 2.5–4.6)

## 2020-10-07 LAB — MAGNESIUM: Magnesium: 2.2 mg/dL (ref 1.7–2.4)

## 2020-10-07 MED ORDER — SODIUM CHLORIDE 0.9 % IV SOLN
1.0000 g | INTRAVENOUS | Status: DC
  Administered 2020-10-07: 1 g via INTRAVENOUS
  Filled 2020-10-07: qty 10

## 2020-10-07 NOTE — Progress Notes (Addendum)
Electrophysiology Rounding Note  Patient Name: Steven Cain Date of Encounter: 10/07/2020  Primary Cardiologist: No primary care provider on file. Electrophysiologist: New   Subjective   "I'm doing ok. I really havent been able to do much for more than 3 years, since I lost my balance".   At this time, the patient denies chest pain, shortness of breath, or any new concerns.  Inpatient Medications    Scheduled Meds: . aspirin EC  81 mg Oral Daily  . enoxaparin (LOVENOX) injection  40 mg Subcutaneous Q24H  . guaiFENesin  1,200 mg Oral BID  . PARoxetine  40 mg Oral QHS  . psyllium  1 packet Oral Daily  . traZODone  50 mg Oral QHS   Continuous Infusions: . sodium chloride 10 mL/hr at 10/06/20 0427   PRN Meds: sodium chloride, acetaminophen **OR** acetaminophen, albuterol, hydroxypropyl methylcellulose / hypromellose, meclizine, ondansetron **OR** ondansetron (ZOFRAN) IV, polyethylene glycol, senna-docusate   Vital Signs    Vitals:   10/06/20 2020 10/07/20 0005 10/07/20 0304 10/07/20 0751  BP:  (!) 152/50 (!) 158/51 (!) 164/54  Pulse:  (!) 41 79 (!) 42  Resp:  20 14 19   Temp:  97.9 F (36.6 C) 98 F (36.7 C) 98 F (36.7 C)  TempSrc:  Oral Oral Oral  SpO2: 94% 96% 96%   Weight:      Height:        Intake/Output Summary (Last 24 hours) at 10/07/2020 1029 Last data filed at 10/07/2020 0304 Gross per 24 hour  Intake 240 ml  Output 950 ml  Net -710 ml   Filed Weights   10/05/20 0937  Weight: 86.2 kg    Physical Exam    GEN- The patient is elderly appearing, alert and oriented x 3 today.   Head- normocephalic, atraumatic Eyes-  Sclera clear, conjunctiva pink Ears- hearing intact Oropharynx- clear Neck- supple Lungs- Clear to ausculation bilaterally, normal work of breathing Heart- Irregular rate and rhythm, no murmurs, rubs or gallops GI- soft, NT, ND, + BS Extremities- no clubbing or cyanosis. No edema Skin- no rash or lesion Psych- euthymic mood,  full affect Neuro- strength and sensation are intact  Labs    CBC Recent Labs    10/06/20 0339 10/07/20 0331  WBC 8.4 10.8*  NEUTROABS 5.0 6.2  HGB 10.9* 12.2*  HCT 32.3* 37.7*  MCV 85.0 86.1  PLT 181 175   Basic Metabolic Panel Recent Labs    10/06/20 0339 10/07/20 0331  NA 138 136  K 3.8 4.5  CL 104 102  CO2 26 23  GLUCOSE 113* 100*  BUN 18 19  CREATININE 1.58* 1.69*  CALCIUM 8.9 9.3  MG 2.7* 2.2  PHOS  --  3.4   Liver Function Tests Recent Labs    10/06/20 0339 10/07/20 0331  AST 22 19  ALT 11 12  ALKPHOS 69 70  BILITOT 0.4 0.6  PROT 5.6* 6.4*  ALBUMIN 2.7* 3.1*   No results for input(s): LIPASE, AMYLASE in the last 72 hours. Cardiac Enzymes No results for input(s): CKTOTAL, CKMB, CKMBINDEX, TROPONINI in the last 72 hours.   Telemetry    Sinus brady 40s at rest and 50s with interaction. Pt with second degree AV block, Mobitz 1 and Mobitz 2. Overall, rates have improved (personally reviewed)  Radiology    DG Chest 1 View  Result Date: 10/05/2020 CLINICAL DATA:  Concern for pneumonia EXAM: CHEST  1 VIEW COMPARISON:  Chest radiograph June 11, 2018 FINDINGS: The patient  is rotated. The heart size and mediastinal contours are within normal limits. Low lung volumes with bibasilar subsegmental atelectasis superimposed on chronic bronchitic changes. Possible small right pleural effusion. Increased globular calcifications about the left shoulder. Left greater than right glenohumeral joint arthropathy. Surgical clips overlie the upper abdomen. IMPRESSION: 1. Low lung volumes with bibasilar subsegmental atelectasis superimposed on chronic bronchitic changes. No focal consolidation. 2. Possible small right pleural effusion versus scarring. Electronically Signed   By: Dahlia Bailiff MD   On: 10/05/2020 17:12   MR BRAIN WO CONTRAST  Result Date: 10/05/2020 CLINICAL DATA:  Seizure, abnormal neuro exam EXAM: MRI HEAD WITHOUT CONTRAST TECHNIQUE: Multiplanar,  multiecho pulse sequences of the brain and surrounding structures were obtained without intravenous contrast. COMPARISON:  10/05/2020. FINDINGS: Brain: Mild cortically based DWI hyperintensity involving the right parietal and occipitotemporal regions. No intracranial hemorrhage. No midline shift, ventriculomegaly or extra-axial fluid collection. No mass lesion. Chronic right cerebellar insult. Mild cerebral atrophy with ex vacuo dilatation. Vascular: Right V4 segment T2 hyperintense signal. Remaining intracranial major arterial flow voids are proximally preserved. Skull and upper cervical spine: Normal marrow signal. Sinuses/Orbits: Normal orbits. Clear paranasal sinuses. Pneumatized mastoid air cells. Other: Please note image quality is mildly degraded by motion artifact. IMPRESSION: Mild cortically based DWI hyperintensity involving the right parietal and occipitotemporal regions may reflect postictal sequela. No intracranial hemorrhage.  Chronic right cerebellar insult. Right V4 segment hyperintensity, slow flow versus chronic occlusion. Electronically Signed   By: Primitivo Gauze M.D.   On: 10/05/2020 13:27   EEG adult  Result Date: 10/06/2020 Lora Havens, MD     10/06/2020  1:13 PM Patient Name: Steven Cain MRN: [id number] Epilepsy Attending: Lora Havens Referring Physician/Provider: Dr. Fayrene Helper Date: 10/06/2020 Duration: 23.30 minutes Patient history: 87 year old male presented with seizure-like activity.  EEG to evaluate for seizures. Level of alertness: Awake, asleep AEDs during EEG study: Keppra Technical aspects: This EEG study was done with scalp electrodes positioned according to the 10-20 International system of electrode placement. Electrical activity was acquired at a sampling rate of 500Hz  and reviewed with a high frequency filter of 70Hz  and a low frequency filter of 1Hz . EEG data were recorded continuously and digitally stored. Description: The posterior dominant rhythm  consists of 8 Hz activity of moderate voltage (25-35 uV) seen predominantly in posterior head regions, symmetric and reactive to eye opening and eye closing. Sleep was characterized by vertex waves, sleep spindles (12 to 14 Hz), maximal frontocentral region. Hyperventilation and photic stimulation were not performed.   IMPRESSION: This study is within normal limits. No seizures or epileptiform discharges were seen throughout the recording. Lora Havens    Patient Profile     Steven Cain is a 87 y.o. male with a history of seizure disorder (quiescent x 30 years prior to this admit), CKD IIIb, HTN, Chronic bronchitis, GERD,and metastatic prostate cancer to the bone who is being seen today for the evaluation of heart block at the request of Dr. Terri Skains  Assessment & Plan    1. Second degree AV block Mobitz 1 AND Mobitz 2.  Overall his rates and conduction have improved somewhat.  His HRs are in the 40s at rest, and with interaction and conversation pick up into the 50s (Mobitz 1 with occasional periods of 1:1 conduction without Mobitz 1) Pt has been asymptomatic here regardless of HR, but he has also been in bed. Have asked PT to engage today for better idea of functional status.  2. ?Seizure There was question of seizure like activity prior to admission.  EEG and Brain MRI negative.  Neurology ddx includes syncopal convulsions -> ? If brady event was cause and not effect.   Dr. Curt Bears has seen the patient. We will continue to monitor today and consider pacing tomorrow pending how he does today with activity and whether or not his HRs continue to improve  For questions or updates, please contact [place] Please consult [email or web address] for contact info under Cardiology/STEMI.  Signed, Shirley Friar, PA-C  10/07/2020, 10:29 AM   I have seen and examined this patient with Oda Kilts.  Agree with above, note added to reflect my findings.  On exam, RRR, no murmurs.  Patient  with continued second-degree AV block.  He has intermittent Mobitz 1, and intermittent 2-1 AV block.  This could all be due to high vagal tone, though it is unclear whether or not he would still have an elevated vagal tone from his recent seizure.  We will have him work with PT today.  We will continue to monitor on telemetry.  Will evaluate tomorrow for possibility of pacemaker implant.  Will M. Camnitz MD 10/07/2020 1:27 PM

## 2020-10-07 NOTE — Progress Notes (Addendum)
Subjective:  Patient seen and examined at bedside at approximately 8:15 AM, patient's wife is present. Patient is resting comfortably in bed.  He has difficulty hearing and according to his wife is mildly confused compared to his baseline. Patient presently denies chest pain, dyspnea, palpitations, dizziness, lightheadedness, fatigue.  However patient has not been out of bed much since hospitalization. Neurology has seen the patient and reports no evidence of underlying seizure last etiology for syncope.  Patient with no acute MRI changes or changes concerning for ictal or postictal phenomenon and EEG normal. Patient continues to have second-degree AV block with episode of Mobitz type I and of Mobitz type II.  Heart rate remains bradycardic in the 40s bpm.  Intake/Output from previous day:  I/O last 3 completed shifts: In: 321.5 [P.O.:240; I.V.:1; IV Piggyback:80.5] Out: 2250 [Urine:2250] No intake/output data recorded.  Blood pressure (!) 154/61, pulse (!) 40, temperature 97.8 F (36.6 C), temperature source Oral, resp. rate 18, height '5\' 5"'  (1.651 m), weight 86.2 kg, SpO2 98 %. Physical Exam Vitals and nursing note reviewed.  Constitutional:      General: He is not in acute distress.    Appearance: Normal appearance. He is normal weight.     Comments: Sleeping, easily awakened  HENT:     Head: Normocephalic and atraumatic.     Nose: Nose normal.     Mouth/Throat:     Mouth: Mucous membranes are moist.  Eyes:     Extraocular Movements: Extraocular movements intact.     Conjunctiva/sclera: Conjunctivae normal.  Neck:     Vascular: No carotid bruit or JVD.  Cardiovascular:     Rate and Rhythm: Regular rhythm. Bradycardia present.     Pulses: Intact distal pulses.     Heart sounds: S1 normal and S2 normal. No murmur heard. No gallop.      Comments: Unable to appreciate previously noted (by Dr. Terri Skains) diastolic murmur at right intercostal space Pulmonary:     Effort: Pulmonary  effort is normal. No respiratory distress.     Breath sounds: No wheezing, rhonchi or rales.  Abdominal:     General: Abdomen is flat. Bowel sounds are normal. There is no distension.     Palpations: Abdomen is soft.     Tenderness: There is no abdominal tenderness.  Musculoskeletal:        General: No tenderness.     Cervical back: Normal range of motion and neck supple.     Right lower leg: No edema.     Left lower leg: No edema.  Skin:    General: Skin is warm and dry.     Coloration: Skin is not cyanotic.     Findings: No erythema or rash.  Neurological:     General: No focal deficit present.     Mental Status: He is oriented to person, place, and time.     Comments: Wife notes patient appears mildly more confused than baseline as he seems to have trouble following conversation this morning.   Psychiatric:        Mood and Affect: Mood normal.        Behavior: Behavior normal. Behavior is cooperative.     Lab Results: BMP BNP (last 3 results) No results for input(s): BNP in the last 8760 hours.  ProBNP (last 3 results) No results for input(s): PROBNP in the last 8760 hours. BMP Latest Ref Rng & Units 10/07/2020 10/06/2020 10/05/2020  Glucose 70 - 99 mg/dL 100(H) 113(H) 102(H)  BUN 8 -  23 mg/dL '19 18 23  ' Creatinine 0.61 - 1.24 mg/dL 1.69(H) 1.58(H) 1.70(H)  Sodium 135 - 145 mmol/L 136 138 138  Potassium 3.5 - 5.1 mmol/L 4.5 3.8 4.6  Chloride 98 - 111 mmol/L 102 104 100  CO2 22 - 32 mmol/L 23 26 -  Calcium 8.9 - 10.3 mg/dL 9.3 8.9 -   Hepatic Function Latest Ref Rng & Units 10/07/2020 10/06/2020 10/05/2020  Total Protein 6.5 - 8.1 g/dL 6.4(L) 5.6(L) 7.1  Albumin 3.5 - 5.0 g/dL 3.1(L) 2.7(L) 3.5  AST 15 - 41 U/L '19 22 31  ' ALT 0 - 44 U/L '12 11 11  ' Alk Phosphatase 38 - 126 U/L 70 69 79  Total Bilirubin 0.3 - 1.2 mg/dL 0.6 0.4 0.8   CBC Latest Ref Rng & Units 10/07/2020 10/06/2020 10/05/2020  WBC 4.0 - 10.5 K/uL 10.8(H) 8.4 -  Hemoglobin 13.0 - 17.0 g/dL 12.2(L) 10.9(L) 14.3   Hematocrit 39.0 - 52.0 % 37.7(L) 32.3(L) 42.0  Platelets 150 - 400 K/uL 183 181 -   Lipid Panel  No results found for: CHOL, TRIG, HDL, CHOLHDL, VLDL, LDLCALC, LDLDIRECT Cardiac Panel (last 3 results) No results for input(s): CKTOTAL, CKMB, TROPONINI, RELINDX in the last 72 hours.  HEMOGLOBIN A1C No results found for: HGBA1C, MPG TSH Recent Labs    10/06/20 0820  TSH 1.214   Imaging: EEG adult  Result Date: 10/06/2020 Lora Havens, MD     10/06/2020  1:13 PM Patient Name: RIPLEY BOGOSIAN MRN: [id number] Epilepsy Attending: Lora Havens Referring Physician/Provider: Dr. Fayrene Helper Date: 10/06/2020 Duration: 23.30 minutes Patient history: 87 year old male presented with seizure-like activity.  EEG to evaluate for seizures. Level of alertness: Awake, asleep AEDs during EEG study: Keppra Technical aspects: This EEG study was done with scalp electrodes positioned according to the 10-20 International system of electrode placement. Electrical activity was acquired at a sampling rate of '500Hz'  and reviewed with a high frequency filter of '70Hz'  and a low frequency filter of '1Hz' . EEG data were recorded continuously and digitally stored. Description: The posterior dominant rhythm consists of 8 Hz activity of moderate voltage (25-35 uV) seen predominantly in posterior head regions, symmetric and reactive to eye opening and eye closing. Sleep was characterized by vertex waves, sleep spindles (12 to 14 Hz), maximal frontocentral region. Hyperventilation and photic stimulation were not performed.   IMPRESSION: This study is within normal limits. No seizures or epileptiform discharges were seen throughout the recording. Lora Havens    Cardiac Studies: Telemetry: Sinus bradycardia with second-degree AV block Mobitz type I and type II  EKG: 10/05/2020: Normal sinus rhythm, 80 bpm, left axis deviation, left anterior fascicular block, prolonged first-degree AV block, poor R wave progression,  LVH per voltage criteria, without underlying injury pattern.  10/06/2020: Sinus bradycardia, 37 bpm, first-degree AV block with a 2-1 conduction suggestive of second-degree AV block, left anterior fascicular block and nonspecific T wave abnormality without underlying injury pattern.  10/07/2020: Sinus bradycardia with 2:1 conduction likely second-degree AV block at a rate of 41 bpm.  Left axis, left anterior fascicular block.   Echocardiogram: 03/23/2017: LVEF 55-60%, moderate LVH, grade 1 diastolic impairment, elevated LVEDP, mild AR, mild TR, no pericardial effusion.  10/06/2020: Pending  Stress Testing:  NA  Heart Catheterization: NA  Recent Results (from the past 43800 hour(s))  ECHOCARDIOGRAM COMPLETE   Collection Time: 10/06/20 10:23 AM  Result Value   Weight 3,040.58   Height 65   BP 129/51   *Note: Due  to a large number of results and/or encounters for the requested time period, some results have not been displayed. A complete set of results can be found in Results Review.    Scheduled Meds: . aspirin EC  81 mg Oral Daily  . enoxaparin (LOVENOX) injection  40 mg Subcutaneous Q24H  . guaiFENesin  1,200 mg Oral BID  . PARoxetine  40 mg Oral QHS  . psyllium  1 packet Oral Daily  . traZODone  50 mg Oral QHS   Continuous Infusions: . sodium chloride 10 mL/hr at 10/06/20 0427   PRN Meds:.sodium chloride, acetaminophen **OR** acetaminophen, albuterol, hydroxypropyl methylcellulose / hypromellose, meclizine, ondansetron **OR** ondansetron (ZOFRAN) IV, polyethylene glycol, senna-docusate  Assessment/Plan:  As neurology has found no evidence of seizure activity with negative brain MRI and EEG, neurology suspects syncopal convulsions.  Patient continues to have significant bradycardia with second-degree AV block Mobitz type II and type I noted on telemetry. Suspect bradycardia and second-degree AV block as underlying etiology of syncopal episode, although patient has been  relatively asymptomatic since admission, he has not been active.   Will continue to avoid AV nodal blocking agents.   Magnesium stable at 2.2 and potassium at 4.5. Patient will need ischemic evaluation in the future. Further recommendations regarding management of secondary AV block per cardiac electrophysiology. Continue management of chronic comorbid conditions per primary team.    Alethia Berthold, PA-C 10/07/2020, 4:37 PM Office: [phone]  ADDENDUM:   Shared Visit: Patient was independently interviewed and examined by me at 835am. This has been a shared visit with Lawerance Cruel, PA in consultation with Dr. Terri Skains. I reviewed the above and agree the findings and recommendations, unless noted differently below.     PHYSICAL EXAM: Vitals with BMI 10/07/2020 10/07/2020 10/07/2020  Height - - -  Weight - - -  BMI - - -  Systolic 314 388 875  Diastolic 61 54 54  Pulse 40 14 42   CONSTITUTIONAL: Appears older than stated age, hemodynamically stable, no acute distress, sleeping but easily arousable.  SKIN: Skin is warm and dry. No rash noted. No cyanosis. No pallor. No jaundice HEAD: Normocephalic and atraumatic.  EYES: No scleral icterus MOUTH/THROAT: Dry oral membranes.  NECK: No JVD present. No thyromegaly noted. No carotid bruits  LYMPHATIC: No visible cervical adenopathy.  CHEST Normal respiratory effort. No intercostal retractions  LUNGS: Clear to auscultation bilaterally.  No stridor.  Expiratory wheezes. No rales.  CARDIOVASCULAR: Regular, bradycardic, positive Z9-J2, soft diastolic murmur heard at the right intercostal space, no gallops or rubs ABDOMINAL: Obese, soft, nontender, distended, positive bowel sounds, no apparent ascites.  EXTREMITIES: No peripheral edema, 2+ dorsalis pedis and posterior tibial pulses bilaterally, warm to touch HEMATOLOGIC: No significant bruising NEUROLOGIC: Oriented to person, place, and time. Nonfocal. Normal muscle tone.  PSYCHIATRIC:  Normal mood and affect. Normal behavior. Cooperative   12 lead EKG was personally reviewed by me: Sinus rhythm with 2:1 AV block suggestive of type II Mobitz   Assessment/Plan: Bradycardia: Telemetry reviewed which notes intermittent type I and type II Second degree AV block. Patient has remained in bed all day yesterday and did not get a chance to work with PT to see if his heart rate response improves up with physical exertion. Underlying bradycardia may be secondary to increased vagal tone given the patient's clinical presentation versus underlying conduction disease. Patient is currently being followed by electrophysiology, their assistance is appreciated.  Further recommendations to follow as the case evolves.   This  note was created using a voice recognition software as a result there may be grammatical errors inadvertently enclosed that do not reflect the nature of this encounter. Every attempt is made to correct such errors.   Rex Kras, [place], [facility]  Pager: [phone] Office: [phone]

## 2020-10-07 NOTE — Progress Notes (Addendum)
PROGRESS NOTE    Steven Cain  BDZ:[id number] DOB: 29-Oct-1930 DOA: 10/05/2020 PCP: Shon Baton, MD  Chief Complaint  Patient presents with  . 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  Assessment & Plan:   Principal Problem:   Seizure ([place]) Active Problems:   Chronic kidney disease, stage 3b ([place])   Essential hypertension   GERD without esophagitis   Prostate cancer metastatic to bone (HCC)   Chronic bronchitis (HCC)   Cardiac arrhythmia   Second degree AV block, Mobitz type II   Second degree AV block, Mobitz type I   Syncope and collapse  Bradycardia  Conduction Disease Hx 1st degree AV block, but now with mobitz I/II seen on telemetry  EP planning to monitor on tele, will evaluate for possible pacemaker implant tomorrow TSH wnl Amlodipine on hold Echocardiogram pending (read pending)  Syncope  Seizure Like Activity: appreciate neurology consultation, suspicion at this time is 2/2 syncopal convulsion or convulsions in setting of arrhythmia.  EEG without seizures or epileptiform discharges MRI brain with mild cortically based DWI hyperintensity involving R parietal and occipitotemporal regions, may reflect postictal sequela.  No intracranial hemorrhage.  Chronic R cerebellar insult.  R V4 segment hyperintensity. (suspected DWI changes artifactual per neurology) D/c keppra at this time per neurology Outpatient follow up with [facility] Neurology CXR with possible small R effusion, no focal consolidation  Acute Metabolic Encephalopathy Mild hospital delirium it seems Follow urine culture  Delirium precautions W/u additionally as  indicated  Concern for torsades Likely artifact per cards  Pyuria UA with positive nitrite (follow cultures) Follow culture  AKI on CKD IIIb Baseline creatinine unclear, but recently 1.7-1.9 Close to baseline Follow  Prostate Cancer metastatic to bone Follow outpatient  Chronic bronchitis Continue home meds  DVT prophylaxis: lovenox Code Status: full  Family Communication: wife at bedside, abigail ove rphone Disposition:   Status is: Inpatient  Remains inpatient appropriate because:Inpatient level of care appropriate due to severity of illness   Dispo: The patient is from: Home              Anticipated d/c is to: Home              Anticipated d/c date is: > 3 days              Patient currently is not medically stable to d/c.   Difficult to place patient No   Consultants:   Cardiology  EP  neurology  Procedures:  EEG  IMPRESSION: This study is within normal limits. No seizures or epileptiform discharges were seen throughout the recording.   Antimicrobials:  Anti-infectives (From admission, onward)   None     Subjective: No new complaints - he's a little frustrated - feels like no plan Wife notes he's mildly confused last night "sundowning"  Objective: Vitals:   10/07/20 0304 10/07/20 0751 10/07/20 1224 10/07/20 1551  BP: (!) 158/51 (!) 164/54 (!) 159/54 (!) 154/61  Pulse: 79 (!) 42 (!) 14 (!) 40  Resp: 14 19 15 18   Temp: 98 F (36.7 C) 98 F (36.7 C) 98.6 F (37 C) 97.8 F (36.6 C)  TempSrc: Oral Oral Oral Oral  SpO2: 96% 99% 97% 98%  Weight:  Height:        Intake/Output Summary (Last 24 hours) at 10/07/2020 1655 Last data filed at 10/07/2020 0304 Gross per 24 hour  Intake 240 ml  Output 550 ml  Net -310 ml   Filed Weights   10/05/20 0937  Weight: 86.2 kg    Examination:  General: No acute distress. Cardiovascular: bradycardic, regular Lungs: Clear to auscultation bilaterally  Abdomen: Soft, nontender, nondistended   Neurological: Alert and oriented 3. Moves all extremities 4 . Cranial nerves II through XII grossly intact. Skin: Warm and dry. No rashes or lesions. Extremities: No clubbing or cyanosis. No edema.   Data Reviewed: I have personally reviewed following labs and imaging studies  CBC: Recent Labs  Lab 10/05/20 0949 10/05/20 0957 10/06/20 0339 10/07/20 0331  WBC 12.4*  --  8.4 10.8*  NEUTROABS 7.7  --  5.0 6.2  HGB 13.3 14.3 10.9* 12.2*  HCT 40.7 42.0 32.3* 37.7*  MCV 87.3  --  85.0 86.1  PLT 201  --  181 157    Basic Metabolic Panel: Recent Labs  Lab 10/05/20 0949 10/05/20 0957 10/06/20 0339 10/07/20 0331  NA 135 138 138 136  K 4.6 4.6 3.8 4.5  CL 101 100 104 102  CO2 24  --  26 23  GLUCOSE 104* 102* 113* 100*  BUN 17 23 18 19   CREATININE 1.85* 1.70* 1.58* 1.69*  CALCIUM 9.6  --  8.9 9.3  MG  --   --  2.7* 2.2  PHOS  --   --   --  3.4    GFR: Estimated Creatinine Clearance: 31.1 mL/min (A) (by C-G formula based on SCr of 1.69 mg/dL (H)).  Liver Function Tests: Recent Labs  Lab 10/05/20 0949 10/06/20 0339 10/07/20 0331  AST 31 22 19   ALT 11 11 12   ALKPHOS 79 69 70  BILITOT 0.8 0.4 0.6  PROT 7.1 5.6* 6.4*  ALBUMIN 3.5 2.7* 3.1*    CBG: No results for input(s): GLUCAP in the last 168 hours.   Recent Results (from the past 240 hour(s))  Resp Panel by RT-PCR (Flu A&B, Covid) Nasopharyngeal Swab     Status: None   Collection Time: 10/05/20  2:19 PM   Specimen: Nasopharyngeal Swab; Nasopharyngeal(NP) swabs in vial transport medium  Result Value Ref Range Status   SARS Coronavirus 2 by RT PCR NEGATIVE NEGATIVE Final    Comment: (NOTE) SARS-CoV-2 target nucleic acids are NOT DETECTED.  The SARS-CoV-2 RNA is generally detectable in upper respiratory specimens during the acute phase of infection. The lowest concentration of SARS-CoV-2 viral copies this assay can detect is 138 copies/mL. A negative result does not preclude SARS-Cov-2 infection and should  not be used as the sole basis for treatment or other patient management decisions. A negative result may occur with  improper specimen collection/handling, submission of specimen other than nasopharyngeal swab, presence of viral mutation(s) within the areas targeted by this assay, and inadequate number of viral copies(<138 copies/mL). A negative result must be combined with clinical observations, patient history, and epidemiological information. The expected result is Negative.  Fact Sheet for Patients:  EntrepreneurPulse.com.au  Fact Sheet for Healthcare Providers:  IncredibleEmployment.be  This test is no t yet approved or cleared by the [place] FDA and  has been authorized for detection and/or diagnosis of SARS-CoV-2 by FDA under an Emergency Use Authorization (EUA). This EUA will remain  in effect (meaning this test can be used) for the duration of the COVID-19 declaration under  Section 564(b)(1) of the Act, 21 U.S.C.section 360bbb-3(b)(1), unless the authorization is terminated  or revoked sooner.       Influenza A by PCR NEGATIVE NEGATIVE Final   Influenza B by PCR NEGATIVE NEGATIVE Final    Comment: (NOTE) The Xpert Xpress SARS-CoV-2/FLU/RSV plus assay is intended as an aid in the diagnosis of influenza from Nasopharyngeal swab specimens and should not be used as a sole basis for treatment. Nasal washings and aspirates are unacceptable for Xpert Xpress SARS-CoV-2/FLU/RSV testing.  Fact Sheet for Patients: EntrepreneurPulse.com.au  Fact Sheet for Healthcare Providers: IncredibleEmployment.be  This test is not yet approved or cleared by the [place] FDA and has been authorized for detection and/or diagnosis of SARS-CoV-2 by FDA under an Emergency Use Authorization (EUA). This EUA will remain in effect (meaning this test can be used) for the duration of the COVID-19 declaration under  Section 564(b)(1) of the Act, 21 U.S.C. section 360bbb-3(b)(1), unless the authorization is terminated or revoked.  Performed at [facility], [place] [address], [place], [place] [address]          Radiology Studies: EEG adult  Result Date: October 14, 2020 Lora Havens, MD     Oct 14, 2020  1:13 PM Patient Name: BLAKELY GLUTH MRN: [id number] Epilepsy Attending: Lora Havens Referring Physician/Provider: Dr. Fayrene Helper Date: Oct 14, 2020 Duration: 23.30 minutes Patient history: 87 year old male presented with seizure-like activity.  EEG to evaluate for seizures. Level of alertness: Awake, asleep AEDs during EEG study: Keppra Technical aspects: This EEG study was done with scalp electrodes positioned according to the 10-20 International system of electrode placement. Electrical activity was acquired at a sampling rate of 500Hz  and reviewed with a high frequency filter of 70Hz  and a low frequency filter of 1Hz . EEG data were recorded continuously and digitally stored. Description: The posterior dominant rhythm consists of 8 Hz activity of moderate voltage (25-35 uV) seen predominantly in posterior head regions, symmetric and reactive to eye opening and eye closing. Sleep was characterized by vertex waves, sleep spindles (12 to 14 Hz), maximal frontocentral region. Hyperventilation and photic stimulation were not performed.   IMPRESSION: This study is within normal limits. No seizures or epileptiform discharges were seen throughout the recording. Priyanka Barbra Sarks        Scheduled Meds: . aspirin EC  81 mg Oral Daily  . enoxaparin (LOVENOX) injection  40 mg Subcutaneous Q24H  . guaiFENesin  1,200 mg Oral BID  . PARoxetine  40 mg Oral QHS  . psyllium  1 packet Oral Daily  . traZODone  50 mg Oral QHS   Continuous Infusions: . sodium chloride 10 mL/hr at Oct 14, 2020 0427     LOS: 1 day    Time spent: over 30 min    Fayrene Helper, MD [facility]   To contact the  attending provider between 7A-7P or the covering provider during after hours 7P-7A, please log into the web site [email or web address] and access using universal [place] password for that web site. If you do not have the password, please call the hospital operator.  10/07/2020, 4:55 PM

## 2020-10-07 NOTE — Evaluation (Signed)
Physical Therapy Evaluation Patient Details Name: Steven Cain MRN: [id number] DOB: 1931-06-04 Today's Date: 10/07/2020   History of Present Illness  Pt is an 87 y/o male admitted secondary to questionable seizure activity. Was found to have heart block and is undergoing evaluation for possible pacemaker. PMH includes HTN, prostate cancer with bone mets, CVA, CKD, and COVID.  Clinical Impression  Pt admitted secondary to problem above with deficits below. Pt requiring min A to stand and transfer to/from [facility] this session. Pt with periods of HR in mid 60s-low 70s, however, would return to 40s. After sitting on BSC, pt with brief period where monitor read Vtach and HR jumped to 144. RN notified.  Pt's wife reports limited household ambulation at baseline. Gave gait belt for increased safety at home and feel pt would benefit from HHPT. Will continue to follow acutely.     Follow Up Recommendations Home health PT;Supervision for mobility/OOB    Equipment Recommendations  None recommended by PT    Recommendations for Other Services       Precautions / Restrictions Precautions Precautions: Fall Restrictions Weight Bearing Restrictions: No      Mobility  Bed Mobility Overal bed mobility: Needs Assistance Bed Mobility: Supine to Sit;Sit to Supine     Supine to sit: Min assist Sit to supine: Min assist   General bed mobility comments: Min A for trunk elevation to come to sitting and for LE assist for return to supine.    Transfers Overall transfer level: Needs assistance Equipment used: None Transfers: Sit to/from [facility] Sit to Stand: Min assist Stand pivot transfers: Min assist       General transfer comment: Min A For lift assist and steadying to stand and transfer to [facility]. PT standing in front of pt and pt holding to PT arms for support.  Ambulation/Gait                Stairs            Wheelchair Mobility    Modified Rankin (Stroke  Patients Only)       Balance Overall balance assessment: Needs assistance Sitting-balance support: No upper extremity supported;Feet supported Sitting balance-Leahy Scale: Fair     Standing balance support: Bilateral upper extremity supported;During functional activity Standing balance-Leahy Scale: Poor Standing balance comment: Reliant on BUE support                             Pertinent Vitals/Pain Pain Assessment: Faces Faces Pain Scale: No hurt    Home Living Family/patient expects to be discharged to:: Private residence Living Arrangements: Spouse/significant other;Other relatives Available Help at Discharge: Family Type of Home: House Home Access: Ramped entrance     Home Layout: One level Home Equipment: Wallace - 2 wheels;Shower seat;Grab bars - toilet;Grab bars - tub/shower;Transport chair      Prior Function Level of Independence: Needs assistance   Gait / Transfers Assistance Needed: Uses walls/bars on walls for support with short distance ambulation to bathroom.Linus Mako occasionally to the dining room with the RW and assist from family.  ADL's / Homemaking Assistance Needed: pt's wife assists with dressing and bathing, pt has a walk in shower and sits on shower seat to bathe.  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, newspaper        Extremity/Trunk Assessment   Upper Extremity Assessment Upper Extremity Assessment: Defer to OT evaluation  Lower Extremity Assessment Lower Extremity Assessment: Generalized weakness    Cervical / Trunk Assessment Cervical / Trunk Assessment: Kyphotic  Communication   Communication: HOH  Cognition Arousal/Alertness: Awake/alert Behavior During Therapy: WFL for tasks assessed/performed Overall Cognitive Status: History of cognitive impairments - at baseline                                 General Comments: Pt with cognitive deficits at baseline per wife, however, reports memory is slightly  worse since admission to hospital.      General Comments General comments (skin integrity, edema, etc.): While sitting on BSC, pt with brief period of monitor reading Vtach and HR spiked to 144. Returned to upper 40s following. Did have some periods where HR was at mid 60s-low 70s, but was very brief.    Exercises     Assessment/Plan    PT Assessment Patient needs continued PT services  PT Problem List Decreased strength;Decreased activity tolerance;Decreased balance;Decreased mobility;Decreased knowledge of precautions;Decreased cognition;Decreased knowledge of use of DME       PT Treatment Interventions DME instruction;Gait training;Functional mobility training;Therapeutic activities;Balance training;Therapeutic exercise;Patient/family education    PT Goals (Current goals can be found in the Care Plan section)  Acute Rehab PT Goals Patient Stated Goal: to go home PT Goal Formulation: With patient/family Time For Goal Achievement: 10/21/20 Potential to Achieve Goals: Good    Frequency Min 3X/week   Barriers to discharge        Co-evaluation               AM-PAC PT "6 Clicks" Mobility  Outcome Measure Help needed turning from your back to your side while in a flat bed without using bedrails?: A Little Help needed moving from lying on your back to sitting on the side of a flat bed without using bedrails?: A Little Help needed moving to and from a bed to a chair (including a wheelchair)?: A Little Help needed standing up from a chair using your arms (e.g., wheelchair or bedside chair)?: A Little Help needed to walk in hospital room?: A Lot Help needed climbing 3-5 steps with a railing? : A Lot 6 Click Score: 16    End of Session Equipment Utilized During Treatment: Gait belt Activity Tolerance: Patient limited by fatigue Patient left: in bed;with call bell/phone within reach;with family/visitor present Nurse Communication: Mobility status;Other (comment) (HR and  brief period of Vtach) PT Visit Diagnosis: Unsteadiness on feet (R26.81);Muscle weakness (generalized) (M62.81);Difficulty in walking, not elsewhere classified (R26.2)    Time: 0370-4888 PT Time Calculation (min) (ACUTE ONLY): 34 min   Charges:   PT Evaluation $PT Eval Moderate Complexity: 1 Mod PT Treatments $Therapeutic Activity: 8-22 mins        Lou Miner, DPT  Acute Rehabilitation Services  Pager: [phone] Office: [phone]   Rudean Hitt 10/07/2020, 1:25 PM

## 2020-10-08 DIAGNOSIS — J42 Unspecified chronic bronchitis: Secondary | ICD-10-CM | POA: Diagnosis not present

## 2020-10-08 LAB — CBC WITH DIFFERENTIAL/PLATELET
Abs Immature Granulocytes: 0.02 10*3/uL (ref 0.00–0.07)
Basophils Absolute: 0.1 10*3/uL (ref 0.0–0.1)
Basophils Relative: 1 %
Eosinophils Absolute: 0.9 10*3/uL — ABNORMAL HIGH (ref 0.0–0.5)
Eosinophils Relative: 10 %
HCT: 36.2 % — ABNORMAL LOW (ref 39.0–52.0)
Hemoglobin: 12.5 g/dL — ABNORMAL LOW (ref 13.0–17.0)
Immature Granulocytes: 0 %
Lymphocytes Relative: 26 %
Lymphs Abs: 2.3 10*3/uL (ref 0.7–4.0)
MCH: 29.2 pg (ref 26.0–34.0)
MCHC: 34.5 g/dL (ref 30.0–36.0)
MCV: 84.6 fL (ref 80.0–100.0)
Monocytes Absolute: 0.7 10*3/uL (ref 0.1–1.0)
Monocytes Relative: 8 %
Neutro Abs: 4.9 10*3/uL (ref 1.7–7.7)
Neutrophils Relative %: 55 %
Platelets: 146 10*3/uL — ABNORMAL LOW (ref 150–400)
RBC: 4.28 MIL/uL (ref 4.22–5.81)
RDW: 14.6 % (ref 11.5–15.5)
WBC: 8.9 10*3/uL (ref 4.0–10.5)
nRBC: 0 % (ref 0.0–0.2)

## 2020-10-08 LAB — MAGNESIUM: Magnesium: 2 mg/dL (ref 1.7–2.4)

## 2020-10-08 LAB — COMPREHENSIVE METABOLIC PANEL
ALT: 9 U/L (ref 0–44)
AST: 21 U/L (ref 15–41)
Albumin: 2.9 g/dL — ABNORMAL LOW (ref 3.5–5.0)
Alkaline Phosphatase: 63 U/L (ref 38–126)
Anion gap: 10 (ref 5–15)
BUN: 16 mg/dL (ref 8–23)
CO2: 23 mmol/L (ref 22–32)
Calcium: 8.9 mg/dL (ref 8.9–10.3)
Chloride: 102 mmol/L (ref 98–111)
Creatinine, Ser: 1.53 mg/dL — ABNORMAL HIGH (ref 0.61–1.24)
GFR, Estimated: 44 mL/min — ABNORMAL LOW (ref >60)
Glucose, Bld: 99 mg/dL (ref 70–99)
Potassium: 4.5 mmol/L (ref 3.5–5.1)
Sodium: 135 mmol/L (ref 135–145)
Total Bilirubin: 1 mg/dL (ref 0.3–1.2)
Total Protein: 6.3 g/dL — ABNORMAL LOW (ref 6.5–8.1)

## 2020-10-08 LAB — PHOSPHORUS: Phosphorus: 3.5 mg/dL (ref 2.5–4.6)

## 2020-10-08 MED ORDER — SODIUM CHLORIDE 0.9 % IV SOLN
2.0000 g | INTRAVENOUS | Status: DC
  Administered 2020-10-08: 2 g via INTRAVENOUS
  Filled 2020-10-08: qty 2

## 2020-10-08 MED ORDER — SODIUM CHLORIDE 0.9 % IV SOLN: INTRAVENOUS | Status: DC

## 2020-10-08 NOTE — Evaluation (Signed)
Occupational Therapy Evaluation Patient Details Name: Steven Cain MRN: [id number] DOB: 1931-03-03 Today's Date: 10/08/2020    History of Present Illness Pt is an 87 y/o male admitted secondary to questionable seizure activity. Was found to have heart block and is undergoing evaluation for possible pacemaker. PMH includes HTN, prostate cancer with bone mets, CVA, CKD, and COVID.   Clinical Impression   PTA patient was living with his wife in a private residence and was requiring assist with all ADLs/IADLs at baseline. Wife present at bedside throughout assessment. Patient currently functioning below baseline limited by symptomatic bradycardia, generalized deconditioning, decreased balance and decreased activity tolerance. Patient would benefit from continued acute OT services to maximize safety and independence with self-care tasks in prep for safe d/c home with wife. Recommendation for [facility] services post d/c.     Follow Up Recommendations  Home health OT;Supervision/Assistance - 24 hour    Equipment Recommendations  Other (comment) (TBD)    Recommendations for Other Services       Precautions / Restrictions Precautions Precautions: Fall Restrictions Weight Bearing Restrictions: No      Mobility Bed Mobility Overal bed mobility: Needs Assistance Bed Mobility: Supine to Sit;Sit to Supine     Supine to sit: Min assist Sit to supine: Min assist   General bed mobility comments: Min A for trunk elevation to come to sitting and for LE assist for return to supine.    Transfers Overall transfer level: Needs assistance Equipment used: None Transfers: Sit to/from [facility] Sit to Stand: Min guard;Min assist Stand pivot transfers: Min assist       General transfer comment: Min A for sit to stand transfers from EOB with RA and cues for hand placement.    Balance Overall balance assessment: Needs assistance Sitting-balance support: No upper extremity  supported;Feet supported Sitting balance-Leahy Scale: Fair     Standing balance support: Bilateral upper extremity supported;During functional activity Standing balance-Leahy Scale: Poor Standing balance comment: Reliant on BUE support                           ADL either performed or assessed with clinical judgement   ADL Overall ADL's : Needs assistance/impaired     Grooming: Set up;Sitting Grooming Details (indicate cue type and reason): Able to wash face seated EOB.             Lower Body Dressing: Moderate assistance;Sit to/from stand Lower Body Dressing Details (indicate cue type and reason): Assist to don bilateral socks in supine. Wife assist with bathing/dressing at baseline. Toilet Transfer: Minimal Insurance claims handler Details (indicate cue type and reason): Min A for transfer to [facility] at EOB with use of RW and cues for hand placement.         Functional mobility during ADLs: Min guard;Rolling walker General ADL Comments: Patient limited by symptomatic bradycardia, generalized weakness, and decrased standing balance.     Vision         Perception     Praxis      Pertinent Vitals/Pain Pain Assessment: Faces Faces Pain Scale: Hurts a little bit Pain Location: Generalized with movement. Pain Descriptors / Indicators: Discomfort Pain Intervention(s): Monitored during session     Hand Dominance Right   Extremity/Trunk Assessment Upper Extremity Assessment Upper Extremity Assessment: Generalized weakness (Limited AROM at shoulders 2/2 OA.)   Lower Extremity Assessment Lower Extremity Assessment: Generalized weakness   Cervical / Trunk Assessment Cervical / Trunk Assessment: Kyphotic  Communication Communication Communication: HOH   Cognition Arousal/Alertness: Awake/alert Behavior During Therapy: WFL for tasks assessed/performed Overall Cognitive Status: History of cognitive impairments - at baseline                                  General Comments: Pt with cognitive deficits at baseline per wife, however, reports memory is slightly worse since admission to hospital. Patient alert and oriented to person, place and situation. Engages in casual conversation.   General Comments  HR in 30's at rest. Seated EOB HR in 40's-50s. HR incraesed to low 60's with light activity. A-fib noted.    Exercises     Shoulder Instructions      Home Living Family/patient expects to be discharged to:: Private residence Living Arrangements: Spouse/significant other;Other relatives Available Help at Discharge: Family Type of Home: House Home Access: Ramped entrance     [place]: One level     Bathroom Shower/Tub: Occupational psychologist: Handicapped height Bathroom Accessibility: Yes   Home Equipment: Environmental consultant - 2 wheels;Shower seat;Grab bars - toilet;Grab bars - tub/shower;Transport chair          Prior Functioning/Environment Level of Independence: Needs assistance  Gait / Transfers Assistance Needed: Uses walls/bars on walls for support with short distance ambulation to bathroom.Linus Mako occasionally to the dining room with the RW and assist from family. Has transport chair for longer distances. ADL's / Homemaking Assistance Needed: pt's wife assists with dressing and bathing, pt has a walk in shower and sits on built-in shower seat to bathe.   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 OT Goal Formulation: With patient Time For Goal Achievement:  10/22/20 Potential to Achieve Goals: Good ADL Goals Pt Will Transfer to Toilet: with supervision;ambulating;grab bars Pt Will Perform Toileting - Clothing Manipulation and hygiene: with supervision;sit to/from stand Additional ADL Goal #1: Patient will tolerate >10 min of OOB activity with HR greater than 60bpm. Additional ADL Goal #2: Patient will tolerate 1 set x10 reps each of BUE HEP to improve strength in prep for ADLs.  OT Frequency: Min 2X/week   Barriers to D/C:            Co-evaluation              AM-PAC OT "6 Clicks" Daily Activity     Outcome Measure Help from another person eating meals?: None Help from another person taking care of personal grooming?: A Little Help from another person toileting, which includes using toliet, bedpan, or urinal?: A Little Help from another person bathing (including washing, rinsing, drying)?: A Little Help from another person to put on and taking off regular upper body clothing?: A Little Help from another person to put on and taking off regular lower body clothing?: A Little 6 Click Score: 19   End of Session Equipment Utilized During Treatment: Gait belt;Rolling walker Nurse Communication: Mobility status  Activity Tolerance: Patient limited by fatigue Patient left: Other (comment);with family/visitor present (Seated on BSC. Nursing staff aware.)  OT Visit Diagnosis: Unsteadiness on feet (R26.81);Muscle weakness (generalized) (M62.81);History of falling (Z91.81)  Time: 9702-6378 OT Time Calculation (min): 27 min Charges:  OT General Charges $OT Visit: 1 Visit OT Evaluation $OT Eval Moderate Complexity: 1 Mod OT Treatments $Self Care/Home Management : 8-22 mins  Bradyn Soward H. OTR/L Supplemental OT, Department of rehab services [phone]  Krystyne Tewksbury R H. 10/08/2020, 11:41 AM

## 2020-10-08 NOTE — Progress Notes (Signed)
Pharmacy Antibiotic Note  Steven Cain is a 87 y.o. male with CKD stage IIIB admitted on 10/05/2020 with seizures.  Pharmacy has been consulted for cefepime dosing for UTI.  WBC 8.9, afebrile; Scr 1.53, CrCl 34.4 ml/min (renal function stable)  Plan: Cefepime 2 gm IV Q 24 hrs Monitor WBC, temp, urine cx (suscept), renal function  Height: 5\' 5"  (165.1 cm) Weight: 86.2 kg (190 lb 0.6 oz) IBW/kg (Calculated) : 61.5  Temp (24hrs), Avg:98.1 F (36.7 C), Min:97.5 F (36.4 C), Max:98.6 F (37 C)  Recent Labs  Lab 10/05/20 0949 10/05/20 0957 10/06/20 0339 10/07/20 0331 10/08/20 0356  WBC 12.4*  --  8.4 10.8* 8.9  CREATININE 1.85* 1.70* 1.58* 1.69* 1.53*    Estimated Creatinine Clearance: 34.4 mL/min (A) (by C-G formula based on SCr of 1.53 mg/dL (H)).    Allergies  Allergen Reactions  . Other Other (See Comments)    Bee stings - unknown    Antimicrobials this admission: Ceftriaxone X 1: 2/24 Cefepime: 2/25 >>  Microbiology results: 2/24 UCx: >100,000 colonies/ml Pseudomonas aeruginosa, suscept pending  2/22 COVID, flu A, flu B  Thank you for allowing pharmacy to be a part of this patient's care.  Gillermina Hu, PharmD, BCPS, [facility] Clinical Pharmacist 10/08/2020 7:39 PM

## 2020-10-08 NOTE — Progress Notes (Addendum)
PROGRESS NOTE    [name]  JJK:[id number] DOB: October 17, 1930 DOA: 10/05/2020 PCP: Shon Baton, MD  Chief Complaint  Patient presents with  . 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  Assessment & Plan:   Principal Problem:   Seizure ([place]) Active Problems:   Chronic kidney disease, stage 3b ([place])   Essential hypertension   GERD without esophagitis   Prostate cancer metastatic to bone (HCC)   Chronic bronchitis (HCC)   Cardiac arrhythmia   Second degree AV block, Mobitz type II   Second degree AV block, Mobitz type I   Syncope and collapse  Bradycardia  Conduction Disease Hx 1st degree AV block, but now with mobitz I/II seen on telemetry  Appreciate EP assistance -> suspects profound orthostatic hypotension likely cause for below and no clear indication for pacing.  Recommended holding amlodipine and reassessing heart rate in about 2 weeks. TSH wnl Amlodipine on hold Echocardiogram (EF 55-60%, moderately elevated PASP, borderline dilatation of aortic root )  Orthostatic Hypotension Appreciate cardiology/EP recs Thigh high ted hose, abdominal binders, elevate head of bed Follow orthostatics daily  Syncope  Seizure Like Activity: appreciate neurology consultation, suspicion at this time is 2/2 syncopal convulsion or convulsions in setting of arrhythmia.  EEG without seizures or epileptiform discharges MRI brain with mild cortically based DWI hyperintensity involving R parietal and occipitotemporal regions, may reflect postictal sequela.  No intracranial hemorrhage.  Chronic R cerebellar insult.  R V4 segment hyperintensity. (suspected DWI changes  artifactual per neurology) D/c keppra at this time per neurology Outpatient follow up with [facility] Neurology CXR with possible small R effusion, no focal consolidation  Acute Metabolic Encephalopathy Mild hospital delirium it seems Follow urine culture -> growing pseudomonas, follow Delirium precautions W/u additionally as indicated  Concern for torsades Likely artifact per cards  Pseudomonas UTI UA with positive nitrite (follow cultures) Cefepime  AKI on CKD IIIb Baseline creatinine unclear, but recently 1.7-1.9 Close to baseline Follow  Prostate Cancer metastatic to bone Follow outpatient  Chronic bronchitis Continue home meds  DVT prophylaxis: lovenox Code Status: full  Family Communication: wife at bedside, abigail didn't answer phone 2/25 Disposition:   Status is: Inpatient  Remains inpatient appropriate because:Inpatient level of care appropriate due to severity of illness   Dispo: The patient is from: Home              Anticipated d/c is to: Home              Anticipated d/c date is: > 3 days              Patient currently is not medically stable to d/c.   Difficult to place patient No   Consultants:   Cardiology  EP  neurology  Procedures:  Echo IMPRESSIONS    1. Left ventricular ejection fraction, by estimation, is 55 to 60%. The  left ventricle has normal function. The left ventricle has no regional  wall motion abnormalities. Diastolic function is not assessed due to AV  block.  2. Right ventricular systolic function is low normal. The right  ventricular size is normal. There is moderately elevated pulmonary artery  systolic pressure. The estimated right ventricular systolic pressure is  37.1 mmHg.  3. The mitral valve is grossly normal. Trivial mitral valve  regurgitation. No evidence of mitral stenosis.  4. The aortic valve is tricuspid. Aortic valve regurgitation is trivial.  Mild aortic valve sclerosis is present, with no  evidence of aortic valve  stenosis.  5. Aortic Normal proximal ascending aorta. There is borderline dilatation  of the aortic root, measuring 39 mm.  6. The inferior vena cava is normal in size with greater than 50%  respiratory variability, suggesting right atrial pressure of 3 mmHg.   EEG  IMPRESSION: This study is within normal limits. No seizures or epileptiform discharges were seen throughout the recording.   Antimicrobials:  Anti-infectives (From admission, onward)   Start     Dose/Rate Route Frequency Ordered Stop   10/07/20 1900  cefTRIAXone (ROCEPHIN) 1 g in sodium chloride 0.9 % 100 mL IVPB        1 g 200 mL/hr over 30 Minutes Intravenous Every 24 hours 10/07/20 1814 10/12/20 1859     Subjective: No new complaints, pleasantly confused  Objective: Vitals:   10/08/20 0736 10/08/20 0830 10/08/20 1030 10/08/20 1033  BP: (!) 136/54 (!) 138/56 (!) 158/44 (!) 147/48  Pulse: (!) 43 (!) 42 (!) 38 (!) 37  Resp: 20     Temp: 98 F (36.7 C) 98.6 F (37 C)    TempSrc: Oral Oral    SpO2: 100% 100% 97% 97%  Weight:      Height:        Intake/Output Summary (Last 24 hours) at 10/08/2020 1124 Last data filed at 10/08/2020 0811 Gross per 24 hour  Intake 120 ml  Output 550 ml  Net -430 ml   Filed Weights   10/05/20 0937  Weight: 86.2 kg    Examination:  General: No acute distress. Cardiovascular: bradycardic Lungs: Clear to auscultation bilaterally Abdomen: Soft, nontender, nondistended  Neurological: Alert, mildly confused. Moves all extremities 4. Cranial nerves II through XII grossly intact. Skin: Warm and dry. No rashes or lesions. Extremities: No clubbing or cyanosis. No edema.   Data Reviewed: I have personally reviewed following labs and imaging studies  CBC: Recent Labs  Lab 10/05/20 0949 10/05/20 0957 10/06/20 0339 10/07/20 0331 10/08/20 0356  WBC 12.4*  --  8.4 10.8* 8.9  NEUTROABS 7.7  --  5.0 6.2 4.9  HGB 13.3 14.3 10.9* 12.2* 12.5*  HCT  40.7 42.0 32.3* 37.7* 36.2*  MCV 87.3  --  85.0 86.1 84.6  PLT 201  --  181 183 146*    Basic Metabolic Panel: Recent Labs  Lab 10/05/20 0949 10/05/20 0957 10/06/20 0339 10/07/20 0331 10/08/20 0356  NA 135 138 138 136 135  K 4.6 4.6 3.8 4.5 4.5  CL 101 100 104 102 102  CO2 24  --  26 23 23   GLUCOSE 104* 102* 113* 100* 99  BUN 17 23 18 19 16   CREATININE 1.85* 1.70* 1.58* 1.69* 1.53*  CALCIUM 9.6  --  8.9 9.3 8.9  MG  --   --  2.7* 2.2 2.0  PHOS  --   --   --  3.4 3.5    GFR: Estimated Creatinine Clearance: 34.4 mL/min (Petra Dumler) (by C-G formula based on SCr of 1.53 mg/dL (H)).  Liver Function Tests: Recent Labs  Lab 10/05/20 0949 10/06/20 0339 10/07/20 0331 10/08/20 0356  AST 31 22 19 21   ALT 11 11 12 9   ALKPHOS 79 69 70 63  BILITOT 0.8 0.4 0.6 1.0  PROT 7.1 5.6* 6.4* 6.3*  ALBUMIN 3.5 2.7* 3.1* 2.9*    CBG: No results for input(s): GLUCAP in the last 168 hours.   Recent Results (from the past 240 hour(s))  Resp Panel by RT-PCR (Flu Laraina Sulton&B, Covid) Nasopharyngeal Swab     Status: None   Collection Time: 10/05/20  2:19 PM   Specimen: Nasopharyngeal Swab; Nasopharyngeal(NP) swabs in vial transport medium  Result Value Ref Range Status   SARS Coronavirus 2 by RT PCR NEGATIVE NEGATIVE Final    Comment: (NOTE) SARS-CoV-2 target nucleic acids are NOT DETECTED.  The SARS-CoV-2 RNA is generally detectable in upper respiratory specimens during the acute phase of infection. The lowest concentration of SARS-CoV-2 viral copies this assay can detect is 138 copies/mL. Scott Fix negative result does not preclude SARS-Cov-2 infection and should not be used as the sole basis for treatment or other patient management decisions. [name] negative result may occur with  improper specimen collection/handling, submission of specimen other than nasopharyngeal swab, presence of viral mutation(s) within the areas targeted by this assay, and inadequate number of viral copies(<138 copies/mL). Alin Chavira negative  result must be combined with clinical observations, patient history, and epidemiological information. The expected result is Negative.  Fact Sheet for Patients:  EntrepreneurPulse.com.au  Fact Sheet for Healthcare Providers:  IncredibleEmployment.be  This test is no t yet approved or cleared by the [place] FDA and  has been authorized for detection and/or diagnosis of SARS-CoV-2 by FDA under an Emergency Use Authorization (EUA). This EUA will remain  in effect (meaning this test can be used) for the duration of the COVID-19 declaration under Section 564(b)(1) of the Act, 21 U.S.C.section 360bbb-3(b)(1), unless the authorization is terminated  or revoked sooner.       Influenza Dynasia Kercheval by PCR NEGATIVE NEGATIVE Final   Influenza B by PCR NEGATIVE NEGATIVE Final    Comment: (NOTE) The Xpert Xpress SARS-CoV-2/FLU/RSV plus assay is intended as an aid in the diagnosis of influenza from Nasopharyngeal swab specimens and should not be used as Siani Utke sole basis for treatment. Nasal washings and aspirates are unacceptable for Xpert Xpress SARS-CoV-2/FLU/RSV testing.  Fact Sheet for Patients: EntrepreneurPulse.com.au  Fact Sheet for Healthcare Providers: IncredibleEmployment.be  This test is not yet approved or cleared by the [place] FDA and has been authorized for detection and/or diagnosis of SARS-CoV-2 by FDA under an Emergency Use Authorization (EUA). This EUA will remain in effect (meaning this test can be used) for the duration of the COVID-19 declaration under Section 564(b)(1) of the Act, 21 U.S.C. section 360bbb-3(b)(1), unless the authorization is terminated or revoked.  Performed at [facility], [place] [address], [place], [place] [address]   Culture, Urine     Status: Abnormal (Preliminary result)   Collection Time: 10/07/20 12:07 AM   Specimen: Urine, Random  Result Value Ref Range Status    Specimen Description URINE, RANDOM  Final   Special Requests   Final    NONE Performed at [facility], [place] [address] [place], [place] [address]    Culture >=100,000 COLONIES/mL GRAM NEGATIVE RODS (Tarrence Enck)  Final   Report Status PENDING  Incomplete         Radiology Studies: EEG adult  Result Date: October 24, 2020 Lora Havens, MD     10/24/20  1:13 PM Patient Name: Steven Cain MRN: [id number] Epilepsy Attending: Lora Havens Referring Physician/Provider: Dr. Fayrene Helper Date: 2020/10/24 Duration: 23.30 minutes Patient history: 87 year old male presented with seizure-like activity.  EEG to evaluate for seizures. Level  of alertness: Awake, asleep AEDs during EEG study: Keppra Technical aspects: This EEG study was done with scalp electrodes positioned according to the 10-20 International system of electrode placement. Electrical activity was acquired at Taysean Wager sampling rate of 500Hz  and reviewed with Shreena Baines high frequency filter of 70Hz  and Ariza Evans low frequency filter of 1Hz . EEG data were recorded continuously and digitally stored. Description: The posterior dominant rhythm consists of 8 Hz activity of moderate voltage (25-35 uV) seen predominantly in posterior head regions, symmetric and reactive to eye opening and eye closing. Sleep was characterized by vertex waves, sleep spindles (12 to 14 Hz), maximal frontocentral region. Hyperventilation and photic stimulation were not performed.   IMPRESSION: This study is within normal limits. No seizures or epileptiform discharges were seen throughout the recording. Priyanka Barbra Sarks        Scheduled Meds: . aspirin EC  81 mg Oral Daily  . guaiFENesin  1,200 mg Oral BID  . PARoxetine  40 mg Oral QHS  . psyllium  1 packet Oral Daily  . traZODone  50 mg Oral QHS   Continuous Infusions: . sodium chloride 10 mL/hr at 10/06/20 0427  . sodium chloride 50 mL/hr at 10/08/20 0903  . cefTRIAXone (ROCEPHIN)  IV Stopped (10/07/20 2300)     LOS: 2 days     Time spent: over 30 min    Fayrene Helper, MD [facility]   To contact the attending provider between 7A-7P or the covering provider during after hours 7P-7A, please log into the web site [email or web address] and access using universal [place] password for that web site. If you do not have the password, please call the hospital operator.  10/08/2020, 11:24 AM

## 2020-10-08 NOTE — Progress Notes (Signed)
Notified from [place] of 2 to 1 2nd degree HB at 0245,then notified MD of change from prior NSR. Patient shows no distress, asleep in bed A&O X4, BP 137/50 HR 34-39, RR 20, O2  97%RA, denies chest pain.

## 2020-10-08 NOTE — Progress Notes (Addendum)
Electrophysiology Rounding Note  Patient Name: Steven Cain Date of Encounter: 10/08/2020  Primary Cardiologist: No primary care provider on file. Electrophysiologist: New   Subjective   Feeling OK this am. Wife states patient has assists for most ADLs at home. He gets up out of bed with the assist of a pull bar, and the bathroom is right next door.  He is able to shuffle along the pull bar. Any further, he requires assistance with either wheelchair or walker.   Inpatient Medications    Scheduled Meds: . aspirin EC  81 mg Oral Daily  . guaiFENesin  1,200 mg Oral BID  . PARoxetine  40 mg Oral QHS  . psyllium  1 packet Oral Daily  . traZODone  50 mg Oral QHS   Continuous Infusions: . sodium chloride 10 mL/hr at 10/06/20 0427  . cefTRIAXone (ROCEPHIN)  IV 1 g (10/07/20 2154)   PRN Meds: sodium chloride, acetaminophen **OR** acetaminophen, albuterol, hydroxypropyl methylcellulose / hypromellose, meclizine, ondansetron **OR** ondansetron (ZOFRAN) IV, polyethylene glycol, senna-docusate   Vital Signs    Vitals:   10/07/20 2355 10/08/20 0230 10/08/20 0436 10/08/20 0736  BP: (!) 150/55 (!) 137/50 (!) 157/49 (!) 136/54  Pulse: 75 (!) 38 (!) 50 (!) 43  Resp: 19  14 20   Temp: (!) 97.5 F (36.4 C)  97.7 F (36.5 C) 98 F (36.7 C)  TempSrc: Oral  Oral Oral  SpO2: 93%  100% 100%  Weight:      Height:        Intake/Output Summary (Last 24 hours) at 10/08/2020 0745 Last data filed at 10/07/2020 1200 Gross per 24 hour  Intake 120 ml  Output --  Net 120 ml   Filed Weights   10/05/20 0937  Weight: 86.2 kg    Physical Exam    GEN- The patient is elderly and somewhat frail appearing, alert and oriented x 3 today.   Head- normocephalic, atraumatic Eyes-  Sclera clear, conjunctiva pink Ears- hearing intact Oropharynx- clear Neck- supple Lungs- Clear to ausculation bilaterally, normal work of breathing Heart- Irregularly irregular rate and rhythm, no murmurs, rubs or  gallops GI- soft, NT, ND, + BS Extremities- no clubbing or cyanosis. No edema Skin- no rash or lesion Psych- euthymic mood, full affect Neuro- strength and sensation are intact  Labs    CBC Recent Labs    10/07/20 0331 10/08/20 0356  WBC 10.8* 8.9  NEUTROABS 6.2 4.9  HGB 12.2* 12.5*  HCT 37.7* 36.2*  MCV 86.1 84.6  PLT 183 341*   Basic Metabolic Panel Recent Labs    10/07/20 0331 10/08/20 0356  NA 136 135  K 4.5 4.5  CL 102 102  CO2 23 23  GLUCOSE 100* 99  BUN 19 16  CREATININE 1.69* 1.53*  CALCIUM 9.3 8.9  MG 2.2 2.0  PHOS 3.4 3.5   Liver Function Tests Recent Labs    10/07/20 0331 10/08/20 0356  AST 19 21  ALT 12 9  ALKPHOS 70 63  BILITOT 0.6 1.0  PROT 6.4* 6.3*  ALBUMIN 3.1* 2.9*   No results for input(s): LIPASE, AMYLASE in the last 72 hours. Cardiac Enzymes No results for input(s): CKTOTAL, CKMB, CKMBINDEX, TROPONINI in the last 72 hours.   Telemetry    Second degree AV block with periods of mobitz 1 and 2:1 AV block. Brief periods of NSR. "VT" episode from yesterday with PT is again noted to be artifact. (personally reviewed)  Radiology    EEG adult  Result Date: 10/06/2020 Lora Havens, MD     10/06/2020  1:13 PM Patient Name: VASIL JUHASZ MRN: [id number] Epilepsy Attending: Lora Havens Referring Physician/Provider: Dr. Fayrene Helper Date: 10/06/2020 Duration: 23.30 minutes Patient history: 87 year old male presented with seizure-like activity.  EEG to evaluate for seizures. Level of alertness: Awake, asleep AEDs during EEG study: Keppra Technical aspects: This EEG study was done with scalp electrodes positioned according to the 10-20 International system of electrode placement. Electrical activity was acquired at a sampling rate of 500Hz  and reviewed with a high frequency filter of 70Hz  and a low frequency filter of 1Hz . EEG data were recorded continuously and digitally stored. Description: The posterior dominant rhythm consists of 8  Hz activity of moderate voltage (25-35 uV) seen predominantly in posterior head regions, symmetric and reactive to eye opening and eye closing. Sleep was characterized by vertex waves, sleep spindles (12 to 14 Hz), maximal frontocentral region. Hyperventilation and photic stimulation were not performed.   IMPRESSION: This study is within normal limits. No seizures or epileptiform discharges were seen throughout the recording. Lora Havens   ECHOCARDIOGRAM COMPLETE  Result Date: 10/07/2020    ECHOCARDIOGRAM REPORT   Patient Name:   DR. Olegario Shearer Date of Exam: 10/06/2020 Medical Rec #:  [id number]           Height:       65.0 in Accession #:    [id number]          Weight:       190.0 lb Date of Birth:  Jun 22, 1931            BSA:          1.936 m Patient Age:    87 years            BP:           129/51 mmHg Patient Gender: M                   HR:           38 bpm. Exam Location:  Inpatient Procedure: 2D Echo, Cardiac Doppler and Color Doppler Indications:    R94.31 Abnormal EKG  History:        Patient has prior history of Echocardiogram examinations, most                 recent 03/23/2017. Signs/Symptoms:Dyspnea; Risk                 Factors:Hypertension.  Sonographer:    Bernadene Person RDCS Referring Phys: [id number] [facility]  Sonographer Comments: Suboptimal parasternal window, suboptimal apical window, patient is morbidly obese and Technically difficult study due to poor echo windows. IMPRESSIONS  1. Left ventricular ejection fraction, by estimation, is 55 to 60%. The left ventricle has normal function. The left ventricle has no regional wall motion abnormalities. Diastolic function is not assessed due to AV block.  2. Right ventricular systolic function is low normal. The right ventricular size is normal. There is moderately elevated pulmonary artery systolic pressure. The estimated right ventricular systolic pressure is 19.3 mmHg.  3. The mitral valve is grossly normal. Trivial mitral valve  regurgitation. No evidence of mitral stenosis.  4. The aortic valve is tricuspid. Aortic valve regurgitation is trivial. Mild aortic valve sclerosis is present, with no evidence of aortic valve stenosis.  5. Aortic Normal proximal ascending aorta. There is borderline dilatation of the aortic root, measuring 39 mm.  6. The inferior  vena cava is normal in size with greater than 50% respiratory variability, suggesting right atrial pressure of 3 mmHg. FINDINGS  Left Ventricle: Left ventricular ejection fraction, by estimation, is 55 to 60%. The left ventricle has normal function. The left ventricle has no regional wall motion abnormalities. The left ventricular internal cavity size was normal in size. There is  no left ventricular hypertrophy. Diastolic function is not assessed due to AV block. Right Ventricle: The right ventricular size is normal. No increase in right ventricular wall thickness. Right ventricular systolic function is low normal. There is moderately elevated pulmonary artery systolic pressure. The tricuspid regurgitant velocity  is 3.46 m/s, and with an assumed right atrial pressure of 3 mmHg, the estimated right ventricular systolic pressure is 96.2 mmHg. Left Atrium: Left atrial size was normal in size. Right Atrium: Right atrial size was normal in size. Pericardium: There is no evidence of pericardial effusion. Mitral Valve: The mitral valve is grossly normal. Trivial mitral valve regurgitation. No evidence of mitral valve stenosis. Tricuspid Valve: The tricuspid valve is grossly normal. Tricuspid valve regurgitation is mild . No evidence of tricuspid stenosis. Aortic Valve: The aortic valve is tricuspid. Aortic valve regurgitation is trivial. Mild aortic valve sclerosis is present, with no evidence of aortic valve stenosis. Pulmonic Valve: The pulmonic valve was not well visualized. Pulmonic valve regurgitation is trivial. No evidence of pulmonic stenosis. Aorta: Normal proximal ascending aorta.  There is borderline dilatation of the aortic root, measuring 39 mm. Venous: The inferior vena cava is normal in size with greater than 50% respiratory variability, suggesting right atrial pressure of 3 mmHg. IAS/Shunts: The atrial septum is grossly normal.  LEFT VENTRICLE PLAX 2D LVIDd:         4.70 cm  Diastology LVIDs:         2.70 cm  LV e' medial:    11.20 cm/s LV PW:         0.80 cm  LV E/e' medial:  8.9 LV IVS:        0.80 cm  LV e' lateral:   19.60 cm/s LVOT diam:     1.90 cm  LV E/e' lateral: 5.1 LV SV:         68 LV SV Index:   35 LVOT Area:     2.84 cm  RIGHT VENTRICLE RV S prime:     8.81 cm/s TAPSE (M-mode): 1.9 cm LEFT ATRIUM             Index       RIGHT ATRIUM           Index LA diam:        3.00 cm 1.55 cm/m  RA Area:     19.00 cm LA Vol (A2C):   38.1 ml 19.68 ml/m RA Volume:   60.50 ml  31.26 ml/m LA Vol (A4C):   37.7 ml 19.48 ml/m LA Biplane Vol: 40.7 ml 21.03 ml/m  AORTIC VALVE LVOT Vmax:   82.00 cm/s LVOT Vmean:  66.700 cm/s LVOT VTI:    0.241 m  AORTA Ao Root diam: 3.90 cm Ao Asc diam:  3.60 cm MITRAL VALVE               TRICUSPID VALVE MV Area (PHT): 2.87 cm    TR Peak grad:   47.9 mmHg MV Decel Time: 264 msec    TR Vmax:        346.00 cm/s MV E velocity: 99.40 cm/s MV A velocity: 52.90 cm/s  SHUNTS MV E/A ratio:  1.88        Systemic VTI:  0.24 m                            Systemic Diam: 1.90 cm Sunit Tolia DO Electronically signed by Rex Kras DO Signature Date/Time: 10/07/2020/8:01:01 PM    Final     Patient Profile     Khris Jansson Harrisis a 87 y.o.malewith a history of seizure disorder (quiescent x 30 years prior to this admit), CKD IIIb, HTN, Chronic bronchitis, GERD,and metastatic prostate cancer to the bonewho is being seen today for the evaluation of heart blockat the request of Dr. Terri Skains  Assessment & Plan    1. Second degree AV block He continues to have Mobitz 1 including 2:1 AV block with HRs overall in 40s, dipping into 30s at times, and brief periods of NSR  in 70s. He is sedentary at home, nearly bedbound, and requires assistance for ADLs.  He has been fatigued, but otherwise has had no dizziness, lightheadedness, syncope, or near syncope here despite HRs in the 30s.   2. ?Seizure There was question of seizure like activity prior to admission.  EEG and Brain MRI negative.  Neurology ddx includes syncopal convulsions -> ? If brady event was cause and not effect.   3. UTI, chronic component Pt has frequent UTIs and per wife, likely a chronic "smoldering" component.  He is currently getting ceftriaxone.  UA showed Nitrites, leukocytes, and bacteria.   4. History of orthostatic syncope Further confounds presentation.  Orthostatics pending  Dr. Caryl Comes to see to further discuss/consider pacing. Pt Steven have clear breakfast and then be NPO.  I Steven also start fluids.   ADDENDUM Dr. Caryl Comes has seen. Pt has profound orthostatic hypotension with his systolic blood pressure dropping into the 70s with standing, from 140-150s, while his HR increased.   He currently has no clear indication for pacing despite underlying bradycardia in the setting of Mobitz 1, which thus far has not been clearly correlated with symptoms.  Recommend non-pharmaceutical treatment of orthostatics to start to include thigh binders, abdominal binders, and keeping the head of his bed elevated where at all possible. Steven discuss continued monitoring of his HRs.   For questions or updates, please contact [place] Please consult [email or web address] for contact info under Cardiology/STEMI.  Signed, Shirley Friar, PA-C  10/08/2020, 7:45 AM   Syncope  ?  Seizure  Mobitz 1 second-degree AV block  Orthostatic hypotension  Largely bedbound   The patient has profound orthostatic hypotension in the context of his event is consistent with a orthostatic trigger, i.e. going from his bed to standing.  This is not a new issue, as his chart carries a diagnosis of orthostatic  syncope and he is very aware of presyncope while showering "unbalance" when he tries to take the 3-4 steps from his bed to the bathroom.  It is exceedingly unlikely that Mobitz 1 heart block with heart rates in the 30s and 40s is responsible for syncope.  Moreover, it would be unlikely that there would be an alternative primary arrhythmia occurring concurrently with the development of his Mobitz 1 heart block which seems to be new.  Amlodipine can rarely affect bradycardia through with calcium channel blockade.  It has a prolonged half-life of greater than 40 hours.  Reassessing his heart rate in 2 weeks or so with a 3-day Zio patch is reasonable although given the  fact that he is bedbound largely and that he has no symptoms while lying in bed of lightheadedness change in mental status or shortness of breath I would have a high threshold for undertaking pacing regardless.  The biggest issue is his orthostasis.  His been flat in bed for protracted periods of time make this problem significantly worse and much less likely to be mitigated.  Strategies to minimize orthostasis would include reverse Trendelenburg in his bed, compressive wear of his abdomen and or thighs (calf compression is of little benefit) or pharmacological strategies including ProAmatine which is useful if you be up for protracted periods of time or pyridostigmine which can help with abrupt changes in blood pressure with standing.  However, seeking to do this nonpharmacologically I think makes more sense  And in this regard I have stressed the importance of reviewing the living situation whereby his standing is minimized.  Specifically, I have suggested that the use urinal instead of trying for him to get to the bathroom.  His bowel movements are every couple of days and that he can do with assistance.  Moreover, if there is a hope that he Steven ever be able to be vertical and stable, getting out of bed and spending time in the chair is going  to be essential.  This may be a possibility begetting and even heaviness in upon the side of the bed with support was the a useful first step.  As alluded to above, I do not think is heart block had anything to do with his syncope moreover, I think is not clear that his heart block as a symptomatic component and as such would not advise pacing  TOTAL TIME 53 MINUTES

## 2020-10-08 NOTE — Progress Notes (Signed)
Notified by RN that telemetry monitor called her and stated pt was in type 2 block again with bradycardia. He has hx of type 2 block and is being evaluated for pacemaker by EP cards.  He is responsive and hemodynamically stable with no symptoms.  Continue to monitor

## 2020-10-09 DIAGNOSIS — J42 Unspecified chronic bronchitis: Secondary | ICD-10-CM | POA: Diagnosis not present

## 2020-10-09 LAB — COMPREHENSIVE METABOLIC PANEL
ALT: 7 U/L (ref 0–44)
AST: 17 U/L (ref 15–41)
Albumin: 2.7 g/dL — ABNORMAL LOW (ref 3.5–5.0)
Alkaline Phosphatase: 62 U/L (ref 38–126)
Anion gap: 8 (ref 5–15)
BUN: 19 mg/dL (ref 8–23)
CO2: 27 mmol/L (ref 22–32)
Calcium: 8.8 mg/dL — ABNORMAL LOW (ref 8.9–10.3)
Chloride: 98 mmol/L (ref 98–111)
Creatinine, Ser: 1.67 mg/dL — ABNORMAL HIGH (ref 0.61–1.24)
GFR, Estimated: 39 mL/min — ABNORMAL LOW (ref >60)
Glucose, Bld: 109 mg/dL — ABNORMAL HIGH (ref 70–99)
Potassium: 5.2 mmol/L — ABNORMAL HIGH (ref 3.5–5.1)
Sodium: 133 mmol/L — ABNORMAL LOW (ref 135–145)
Total Bilirubin: 0.7 mg/dL (ref 0.3–1.2)
Total Protein: 5.8 g/dL — ABNORMAL LOW (ref 6.5–8.1)

## 2020-10-09 LAB — MAGNESIUM: Magnesium: 2 mg/dL (ref 1.7–2.4)

## 2020-10-09 LAB — CBC WITH DIFFERENTIAL/PLATELET
Abs Immature Granulocytes: 0.02 10*3/uL (ref 0.00–0.07)
Basophils Absolute: 0.1 10*3/uL (ref 0.0–0.1)
Basophils Relative: 1 %
Eosinophils Absolute: 0.9 10*3/uL — ABNORMAL HIGH (ref 0.0–0.5)
Eosinophils Relative: 11 %
HCT: 33.9 % — ABNORMAL LOW (ref 39.0–52.0)
Hemoglobin: 11.5 g/dL — ABNORMAL LOW (ref 13.0–17.0)
Immature Granulocytes: 0 %
Lymphocytes Relative: 24 %
Lymphs Abs: 2 10*3/uL (ref 0.7–4.0)
MCH: 29 pg (ref 26.0–34.0)
MCHC: 33.9 g/dL (ref 30.0–36.0)
MCV: 85.4 fL (ref 80.0–100.0)
Monocytes Absolute: 0.8 10*3/uL (ref 0.1–1.0)
Monocytes Relative: 10 %
Neutro Abs: 4.4 10*3/uL (ref 1.7–7.7)
Neutrophils Relative %: 54 %
Platelets: 182 10*3/uL (ref 150–400)
RBC: 3.97 MIL/uL — ABNORMAL LOW (ref 4.22–5.81)
RDW: 14.6 % (ref 11.5–15.5)
WBC: 8.2 10*3/uL (ref 4.0–10.5)
nRBC: 0 % (ref 0.0–0.2)

## 2020-10-09 LAB — URINE CULTURE: Culture: >=100000 — AB

## 2020-10-09 LAB — PHOSPHORUS: Phosphorus: 3.4 mg/dL (ref 2.5–4.6)

## 2020-10-09 MED ORDER — POLYETHYLENE GLYCOL 3350 17 G PO PACK: 17.0000 g | PACK | Freq: Every day | ORAL | Status: DC | PRN

## 2020-10-09 MED ORDER — SODIUM CHLORIDE 0.9 % IV SOLN
2.0000 g | Freq: Two times a day (BID) | INTRAVENOUS | Status: DC
  Administered 2020-10-09: 2 g via INTRAVENOUS
  Filled 2020-10-09: qty 2

## 2020-10-09 MED ORDER — FOSFOMYCIN TROMETHAMINE 3 G PO PACK
3.0000 g | PACK | ORAL | Status: DC
Start: 2020-10-09 — End: 2020-10-10
  Administered 2020-10-09: 3 g via ORAL
  Filled 2020-10-09: qty 3

## 2020-10-09 NOTE — Progress Notes (Signed)
PROGRESS NOTE    Steven Cain  OZH:[id number] DOB: July 16, 1931 DOA: 10/05/2020 PCP: Shon Baton, MD  Chief Complaint  Patient presents with  . Seizures   Brief Narrative:  87 year old male who is Imaya Duffy medical doctor with past medical history of seizure disorder, chronic kidney disease stage IIIb, hypertension, chronic bronchitis, gastroesophageal reflux disease, metastatic prostate cancer to bone who presents to [facility] emergency department via EMS after experiencing an episode of seizure like activity.  Assessment & Plan:   Principal Problem:   Seizure ([place]) Active Problems:   Chronic kidney disease, stage 3b ([place])   Essential hypertension   GERD without esophagitis   Prostate cancer metastatic to bone (HCC)   Chronic bronchitis (HCC)   Cardiac arrhythmia   Second degree AV block, Mobitz type II   Second degree AV block, Mobitz type I   Syncope and collapse  Bradycardia  Conduction Disease Hx 1st degree AV block, but now with mobitz I/II seen on telemetry  Per EP, mobitz 1 and 2:1 AV block seen (unclikely alternative primary arrhythmia concurrently) Appreciate EP assistance -> suspects profound orthostatic hypotension likely cause for below and no clear indication for pacing.  Recommended holding amlodipine and reassessing heart rate in about 2 weeks.   TSH wnl D/c amlodipine Echocardiogram (EF 55-60%, moderately elevated PASP, borderline dilatation of aortic root )  Orthostatic Hypotension  Suspected Postprandial Hypotension Appreciate cardiology/EP recs Persistent today - after eating, SBP 70's when sitting in bed with therapy (had been better this morning) Thigh high ted hose, abdominal binders, elevate head of bed  Behavioral modifications for suspected postprandial hypotension as well Follow orthostatics daily  Syncope  Seizure Like Activity: appreciate neurology consultation, suspicion at this time is 2/2 syncopal convulsion or convulsions in setting of  arrhythmia.  Due to above.  EEG without seizures or epileptiform discharges MRI brain with mild cortically based DWI hyperintensity involving R parietal and occipitotemporal regions, may reflect postictal sequela.  No intracranial hemorrhage.  Chronic R cerebellar insult.  R V4 segment hyperintensity. (suspected DWI changes artifactual per neurology) D/c keppra at this time per neurology Outpatient follow up with [facility] CXR with possible small R effusion, no focal consolidation  Acute Metabolic Encephalopathy Mild hospital delirium it seems Follow urine culture -> growing pseudomonas, treating as below Delirium precautions W/u additionally as indicated  Concern for torsades Likely artifact per cards  Pseudomonas UTI UA with positive nitrite (follow cultures) Cefepime -> will complete therapy with fosfomycin   AKI on CKD IIIb Baseline creatinine unclear, but recently 1.7-1.9 Close to baseline Follow  Prostate Cancer metastatic to bone Follow outpatient  Chronic bronchitis Continue home meds  DVT prophylaxis: lovenox Code Status: full  Family Communication: wife at bedside, abigail didn't answer phone 2/26 Disposition:   Status is: Inpatient  Remains inpatient appropriate because:Inpatient level of care appropriate due to severity of illness   Dispo: The patient is from: Home              Anticipated d/c is to: Home              Anticipated d/c date is: > 3 days              Patient currently is not medically stable to d/c.   Difficult to place patient No   Consultants:   Cardiology  EP  neurology  Procedures:  Echo IMPRESSIONS    1. Left ventricular ejection fraction, by estimation, is 55 to 60%. The  left  ventricle has normal function. The left ventricle has no regional  wall motion abnormalities. Diastolic function is not assessed due to AV  block.  2. Right ventricular systolic function is low normal. The right  ventricular size is  normal. There is moderately elevated pulmonary artery  systolic pressure. The estimated right ventricular systolic pressure is  95.6 mmHg.  3. The mitral valve is grossly normal. Trivial mitral valve  regurgitation. No evidence of mitral stenosis.  4. The aortic valve is tricuspid. Aortic valve regurgitation is trivial.  Mild aortic valve sclerosis is present, with no evidence of aortic valve  stenosis.  5. Aortic Normal proximal ascending aorta. There is borderline dilatation  of the aortic root, measuring 39 mm.  6. The inferior vena cava is normal in size with greater than 50%  respiratory variability, suggesting right atrial pressure of 3 mmHg.   EEG  IMPRESSION: This study is within normal limits. No seizures or epileptiform discharges were seen throughout the recording.   Antimicrobials:  Anti-infectives (From admission, onward)   Start     Dose/Rate Route Frequency Ordered Stop   10/09/20 1200  ceFEPIme (MAXIPIME) 2 g in sodium chloride 0.9 % 100 mL IVPB        2 g 200 mL/hr over 30 Minutes Intravenous Every 12 hours 10/09/20 1053     10/08/20 2015  ceFEPIme (MAXIPIME) 2 g in sodium chloride 0.9 % 100 mL IVPB  Status:  Discontinued        2 g 200 mL/hr over 30 Minutes Intravenous Every 24 hours 10/08/20 1947 10/09/20 1053   10/07/20 1900  cefTRIAXone (ROCEPHIN) 1 g in sodium chloride 0.9 % 100 mL IVPB  Status:  Discontinued        1 g 200 mL/hr over 30 Minutes Intravenous Every 24 hours 10/07/20 1814 10/08/20 1857     Subjective: Remains pleasantly confused - no new concerns or complaints this AM  Objective: Vitals:   10/09/20 0305 10/09/20 0800 10/09/20 1108 10/09/20 1200  BP: (!) 152/54 (!) 174/54 (!) 167/52 (!) 143/68  Pulse: (!) 52 60 (!) 40 (!) 40  Resp: 16 18 20 16   Temp: 98 F (36.7 C)  98.2 F (36.8 C)   TempSrc: Oral  Oral   SpO2: 96% 97% 97% 97%  Weight:      Height:        Intake/Output Summary (Last 24 hours) at 10/09/2020 1529 Last data filed  at 10/09/2020 1000 Gross per 24 hour  Intake 240 ml  Output --  Net 240 ml   Filed Weights   10/05/20 0937  Weight: 86.2 kg    Examination:  General: No acute distress. Cardiovascular: bradycardic, regular Lungs: Clear to auscultation bilaterally - occasional expiratory wheeze Abdomen: Soft, nontender, nondistended  Neurological: Alert, mildly confused. Moves all extremities 4  Cranial nerves II through XII grossly intact. Skin: Warm and dry. No rashes or lesions. Extremities: No clubbing or cyanosis. No edema.  Data Reviewed: I have personally reviewed following labs and imaging studies  CBC: Recent Labs  Lab 10/05/20 0949 10/05/20 0957 10/06/20 0339 10/07/20 0331 10/08/20 0356 10/09/20 0254  WBC 12.4*  --  8.4 10.8* 8.9 8.2  NEUTROABS 7.7  --  5.0 6.2 4.9 4.4  HGB 13.3 14.3 10.9* 12.2* 12.5* 11.5*  HCT 40.7 42.0 32.3* 37.7* 36.2* 33.9*  MCV 87.3  --  85.0 86.1 84.6 85.4  PLT 201  --  181 183 146* 387    Basic Metabolic Panel: Recent Labs  Lab 10/05/20 0949 10/05/20 0957 10/06/20 0339 10/07/20 0331 10/08/20 0356 10/09/20 0254  NA 135 138 138 136 135 133*  K 4.6 4.6 3.8 4.5 4.5 5.2*  CL 101 100 104 102 102 98  CO2 24  --  26 23 23 27   GLUCOSE 104* 102* 113* 100* 99 109*  BUN 17 23 18 19 16 19   CREATININE 1.85* 1.70* 1.58* 1.69* 1.53* 1.67*  CALCIUM 9.6  --  8.9 9.3 8.9 8.8*  MG  --   --  2.7* 2.2 2.0 2.0  PHOS  --   --   --  3.4 3.5 3.4    GFR: Estimated Creatinine Clearance: 31.5 mL/min (Gabrielly Mccrystal) (by C-G formula based on SCr of 1.67 mg/dL (H)).  Liver Function Tests: Recent Labs  Lab 10/05/20 0949 10/06/20 0339 10/07/20 0331 10/08/20 0356 10/09/20 0254  AST 31 22 19 21 17   ALT 11 11 12 9 7   ALKPHOS 79 69 70 63 62  BILITOT 0.8 0.4 0.6 1.0 0.7  PROT 7.1 5.6* 6.4* 6.3* 5.8*  ALBUMIN 3.5 2.7* 3.1* 2.9* 2.7*    CBG: No results for input(s): GLUCAP in the last 168 hours.   Recent Results (from the past 240 hour(s))  Resp Panel by RT-PCR (Flu  Sacha Topor&B, Covid) Nasopharyngeal Swab     Status: None   Collection Time: 10/05/20  2:19 PM   Specimen: Nasopharyngeal Swab; Nasopharyngeal(NP) swabs in vial transport medium  Result Value Ref Range Status   SARS Coronavirus 2 by RT PCR NEGATIVE NEGATIVE Final    Comment: (NOTE) SARS-CoV-2 target nucleic acids are NOT DETECTED.  The SARS-CoV-2 RNA is generally detectable in upper respiratory specimens during the acute phase of infection. The lowest concentration of SARS-CoV-2 viral copies this assay can detect is 138 copies/mL. Meilah Delrosario negative result does not preclude SARS-Cov-2 infection and should not be used as the sole basis for treatment or other patient management decisions. Eleanore Junio negative result may occur with  improper specimen collection/handling, submission of specimen other than nasopharyngeal swab, presence of viral mutation(s) within the areas targeted by this assay, and inadequate number of viral copies(<138 copies/mL). Natajah Derderian negative result must be combined with clinical observations, patient history, and epidemiological information. The expected result is Negative.  Fact Sheet for Patients:  EntrepreneurPulse.com.au  Fact Sheet for Healthcare Providers:  IncredibleEmployment.be  This test is no t yet approved or cleared by the [place] FDA and  has been authorized for detection and/or diagnosis of SARS-CoV-2 by FDA under an Emergency Use Authorization (EUA). This EUA will remain  in effect (meaning this test can be used) for the duration of the COVID-19 declaration under Section 564(b)(1) of the Act, 21 U.S.C.section 360bbb-3(b)(1), unless the authorization is terminated  or revoked sooner.       Influenza Keian Odriscoll by PCR NEGATIVE NEGATIVE Final   Influenza B by PCR NEGATIVE NEGATIVE Final    Comment: (NOTE) The Xpert Xpress SARS-CoV-2/FLU/RSV plus assay is intended as an aid in the diagnosis of influenza from Nasopharyngeal swab specimens  and should not be used as Megean Fabio sole basis for treatment. Nasal washings and aspirates are unacceptable for Xpert Xpress SARS-CoV-2/FLU/RSV testing.  Fact Sheet for Patients: EntrepreneurPulse.com.au  Fact Sheet for Healthcare Providers: IncredibleEmployment.be  This test is not yet approved or cleared by the [place] FDA and has been authorized for detection and/or diagnosis of SARS-CoV-2 by FDA under an Emergency Use Authorization (EUA). This EUA will remain in effect (meaning this test can be used) for the  duration of the COVID-19 declaration under Section 564(b)(1) of the Act, 21 U.S.C. section 360bbb-3(b)(1), unless the authorization is terminated or revoked.  Performed at [facility], [place] [address] [place], [place] [address]   Culture, Urine     Status: Abnormal   Collection Time: 10/07/20 12:07 AM   Specimen: Urine, Random  Result Value Ref Range Status   Specimen Description URINE, RANDOM  Final   Special Requests   Final    NONE Performed at [facility], [place] [address], [place], [place] [address]    Culture >=100,000 COLONIES/mL PSEUDOMONAS AERUGINOSA (Ursula Dermody)  Final   Report Status 10/09/2020 FINAL  Final   Organism ID, Bacteria PSEUDOMONAS AERUGINOSA (Slyvia Lartigue)  Final      Susceptibility   Pseudomonas aeruginosa - MIC*    CEFTAZIDIME 2 SENSITIVE Sensitive     CIPROFLOXACIN <=0.25 SENSITIVE Sensitive     GENTAMICIN <=1 SENSITIVE Sensitive     IMIPENEM <=0.25 SENSITIVE Sensitive     PIP/TAZO <=4 SENSITIVE Sensitive     CEFEPIME 1 SENSITIVE Sensitive     * >=100,000 COLONIES/mL PSEUDOMONAS AERUGINOSA         Radiology Studies: No results found.      Scheduled Meds: . aspirin EC  81 mg Oral Daily  . guaiFENesin  1,200 mg Oral BID  . PARoxetine  40 mg Oral QHS  . psyllium  1 packet Oral Daily  . traZODone  50 mg Oral QHS   Continuous Infusions: . sodium chloride 10 mL/hr at 10/06/20 0427  . sodium chloride  50 mL/hr at 10/08/20 0903  . ceFEPime (MAXIPIME) IV 2 g (10/09/20 1232)     LOS: 3 days    Time spent: over 45 min    Fayrene Helper, MD [facility]   To contact the attending provider between 7A-7P or the covering provider during after hours 7P-7A, please log into the web site [email or web address] and access using universal [place] password for that web site. If you do not have the password, please call the hospital operator.  10/09/2020, 3:29 PM

## 2020-10-09 NOTE — Progress Notes (Signed)
Pharmacy Antibiotic Note  Steven Cain is a 87 y.o. male admitted on 10/05/2020 with UTI.  Pharmacy has been consulted for Cefepime dosing.  ID: PSA UTI pan Sens. Afebrile. WBC 8.2, Scr 1.67  Plan: Increase Cefepime to 2g IV q12 hrs..Dose ok for CrCl>30 Pharmacy will sign off. Please reconsult for further dosing assitance. Will follow peripherally for renal adjustment  Height: 5\' 5"  (165.1 cm) Weight: 86.2 kg (190 lb 0.6 oz) IBW/kg (Calculated) : 61.5  Temp (24hrs), Avg:98.1 F (36.7 C), Min:98 F (36.7 C), Max:98.3 F (36.8 C)  Recent Labs  Lab 10/05/20 0949 10/05/20 0957 10/06/20 0339 10/07/20 0331 10/08/20 0356 10/09/20 0254  WBC 12.4*  --  8.4 10.8* 8.9 8.2  CREATININE 1.85* 1.70* 1.58* 1.69* 1.53* 1.67*    Estimated Creatinine Clearance: 31.5 mL/min (A) (by C-G formula based on SCr of 1.67 mg/dL (H)).    Allergies  Allergen Reactions  . Other Other (See Comments)    Bee stings - unknown     Wayland Salinas 10/09/2020 10:55 AM

## 2020-10-09 NOTE — Progress Notes (Addendum)
Occupational Therapy Treatment Patient Details Name: Steven Cain MRN: [id number] DOB: [birth year] Today's Date: 10/09/2020    History of present illness Pt is an 87 y/o male admitted secondary to questionable seizure activity. Was found to have heart block and is undergoing evaluation for possible pacemaker. PMH includes HTN, prostate cancer with bone mets, CVA, CKD, and COVID.   OT comments  Pt making steady gradual towards OT goals this session. Pt continues to present orthostatic hypotension with mobility. See vitals below with biggest drop in BP noted when transitioning from supine>sitting. BP did improve after sitting EOB ~ 2 mins to complete light therex listed below. Pt also with bradycardia this session with HR ranging from 47-67 bpm. Pt currently requires up to MIN A for sit<>stand from EOB with RW but unable to tolerate further functional mobility d/t dizziness. Pt would continue to benefit from skilled occupational therapy while admitted and after d/c to address the below listed limitations in order to improve overall functional mobility and facilitate independence with BADL participation. DC plan remains appropriate, will follow acutely per POC.   BP supine 122/48 sitting EOB 79/58 sitting EOB ~ 2 mins after completing light seated therex 107/78 Attempted BP in standing although pt reporting dizziness needing to sit therefore BP likely inaccurate 154/65. BP supine at end of session 130/66. * ABD binder and thigh high TED hose donned throughout session*    Follow Up Recommendations  Home health OT;Supervision/Assistance - 24 hour    Equipment Recommendations  Other (comment) (TBD)    Recommendations for Other Services      Precautions / Restrictions Precautions Precautions: Fall Precaution Comments: hx of seizures, OH Restrictions Weight Bearing Restrictions: No       Mobility Bed Mobility Overal bed mobility: Needs Assistance Bed Mobility: Supine to Sit;Sit to  Supine     Supine to sit: Min guard;HOB elevated Sit to supine: Min assist;HOB elevated   General bed mobility comments: minguard for line mgmt and initial tactile cues to initiate task, min A to elevate BLEs back to supine    Transfers Overall transfer level: Needs assistance Equipment used: Rolling walker (2 wheeled) Transfers: Sit to/from Stand Sit to Stand: Min assist         General transfer comment: MIN A for initial steadying assist, unable to pivot d/t reports of lightheadedness    Balance Overall balance assessment: Needs assistance Sitting-balance support: Single extremity supported;No upper extremity supported;Feet supported Sitting balance-Leahy Scale: Fair Sitting balance - Comments: sitting EOB with minguard for safety d/t reports of dizziness                                   ADL either performed or assessed with clinical judgement   ADL Overall ADL's : Needs assistance/impaired                     Lower Body Dressing: Total assistance;Sitting/lateral leans Lower Body Dressing Details (indicate cue type and reason): to adjust socks, pt reports assist for LB bathing at baseline   Toilet Transfer Details (indicate cue type and reason): unable to transfer d/t dizziness/ OH         Functional mobility during ADLs: Minimal assistance;Rolling walker (sit<>stand only) General ADL Comments: Patient limited by symptomatic bradycardia, generalized weakness, and decreased standing balance, OH     Vision       Perception     Praxis  Cognition Arousal/Alertness: Awake/alert Behavior During Therapy: WFL for tasks assessed/performed Overall Cognitive Status: History of cognitive impairments - at baseline                                          Exercises Other Exercises Other Exercises: LAQ x10 BLEs from EOB Other Exercises: seated marches from EOB, x10 reps BLEs   Shoulder Instructions       General  Comments BP supine 122/48, sitting EOB 79/58, sitting EOB ~ 2 mins after completing light seated therex 107/78, attempted BP in standing although pt reporting dizziness needing to sit therfore BP likely inaccurate 154/65. BP supine at end of session 130/66. HR ranging from 47-67 bpm during session- all BPs taken with ABD binder and thigh high compressions donned, wife present during session who is very supportive and reports helping pt a lot with ADLs at baseline.    Pertinent Vitals/ Pain       Pain Assessment: No/denies pain  Home Living                                          Prior Functioning/Environment              Frequency  Min 2X/week        Progress Toward Goals  OT Goals(current goals can now be found in the care plan section)  Progress towards OT goals: Progressing toward goals  Acute Rehab OT Goals Patient Stated Goal: To return home. OT Goal Formulation: With patient/family Time For Goal Achievement: 10/22/20 Potential to Achieve Goals: Good  Plan Discharge plan remains appropriate;Frequency remains appropriate    Co-evaluation                 AM-PAC OT "6 Clicks" Daily Activity     Outcome Measure   Help from another person eating meals?: None Help from another person taking care of personal grooming?: A Little Help from another person toileting, which includes using toliet, bedpan, or urinal?: A Little Help from another person bathing (including washing, rinsing, drying)?: A Little Help from another person to put on and taking off regular upper body clothing?: A Little Help from another person to put on and taking off regular lower body clothing?: A Lot 6 Click Score: 18    End of Session Equipment Utilized During Treatment: Gait belt;Rolling walker  OT Visit Diagnosis: Unsteadiness on feet (R26.81);Muscle weakness (generalized) (M62.81);History of falling (Z91.81)   Activity Tolerance Treatment limited secondary to medical  complications (Comment);Other (comment) (OH)   Patient Left in bed;with call bell/phone within reach;with family/visitor present   Nurse Communication Mobility status        Time: 3419-3790 OT Time Calculation (min): 45 min  Charges: OT General Charges $OT Visit: 1 Visit OT Treatments $Therapeutic Activity: 38-52 mins  Harley Alto., COTA/L Acute Rehabilitation Services [phone]    Precious Haws 10/09/2020, 4:11 PM

## 2020-10-09 NOTE — Progress Notes (Signed)
Physical Therapy Treatment Patient Details Name: Steven Cain MRN: [id number] DOB: 08/31/1930 Today's Date: 10/09/2020    History of Present Illness Pt is an 87 y/o male admitted secondary to questionable seizure activity. Was found to have heart block and is undergoing evaluation for possible pacemaker. PMH includes HTN, prostate cancer with bone mets, CVA, CKD, and COVID.    PT Comments    Pt tolerates treatment well despite some reports of lightheadedness with activity. Pt does experience drop in BP when transitioning from supine to sitting, however PT increases in standing and remains stable for remainder of session. Pt utilizes RW well to mobilize for short distances. Pt, spouse, and PT discuss possible changes in home setup to improve safety when mobilizing to bathroom and to allow for increased time in an upright position. Pt will benefit from HHPT services to aide in home assessment and in improving functional mobility.   Follow Up Recommendations  Home health PT;Supervision for mobility/OOB     Equipment Recommendations  None recommended by PT (pt owns necessary DME)    Recommendations for Other Services       Precautions / Restrictions Precautions Precautions: Fall Precaution Comments: hx of seizures Restrictions Weight Bearing Restrictions: No    Mobility  Bed Mobility Overal bed mobility: Needs Assistance Bed Mobility: Supine to Sit;Sit to Supine     Supine to sit: Supervision Sit to supine: Min guard        Transfers Overall transfer level: Needs assistance Equipment used: Rolling walker (2 wheeled) Transfers: Sit to/from Stand Sit to Stand: Min guard Stand pivot transfers: Min guard          Ambulation/Gait Ambulation/Gait assistance: Min guard Gait Distance (Feet): 6 Feet (6' x2 to and from [facility]) Assistive device: Rolling walker (2 wheeled) Gait Pattern/deviations: Step-to pattern Gait velocity: reduced Gait velocity interpretation: <1.31  ft/sec, indicative of household ambulator General Gait Details: pt with short step-to gait, reduced foot clearance bilaterally   Stairs             Wheelchair Mobility    Modified Rankin (Stroke Patients Only)       Balance Overall balance assessment: Needs assistance Sitting-balance support: Single extremity supported;No upper extremity supported;Feet supported Sitting balance-Leahy Scale: Fair     Standing balance support: No upper extremity supported Standing balance-Leahy Scale: Fair                              Cognition Arousal/Alertness: Awake/alert Behavior During Therapy: WFL for tasks assessed/performed Overall Cognitive Status: History of cognitive impairments - at baseline                                 General Comments: memory impairments, otherwise likely close to baseline      Exercises      General Comments General comments (skin integrity, edema, etc.): HR ranging from 30-80s during session, 30s at rest. Pt on RA. Pt does report lightheadedness intermittently during session. BP supine: 164/56 (85), sitting 118/97 (104), standing 136/71 (90), after use of bedside commode and short distance of amublation 143/68 (87).      Pertinent Vitals/Pain Pain Assessment: No/denies pain    Home Living                      Prior Function  PT Goals (current goals can now be found in the care plan section) Acute Rehab PT Goals Patient Stated Goal: To return home. Progress towards PT goals: Progressing toward goals    Frequency    Min 3X/week      PT Plan Current plan remains appropriate    Co-evaluation              AM-PAC PT "6 Clicks" Mobility   Outcome Measure  Help needed turning from your back to your side while in a flat bed without using bedrails?: A Little Help needed moving from lying on your back to sitting on the side of a flat bed without using bedrails?: A Little Help needed  moving to and from a bed to a chair (including a wheelchair)?: A Little Help needed standing up from a chair using your arms (e.g., wheelchair or bedside chair)?: A Little Help needed to walk in hospital room?: A Little Help needed climbing 3-5 steps with a railing? : A Lot 6 Click Score: 17    End of Session   Activity Tolerance: Patient tolerated treatment well Patient left: in bed;with call bell/phone within reach;with bed alarm set;with family/visitor present Nurse Communication: Mobility status PT Visit Diagnosis: Unsteadiness on feet (R26.81);Muscle weakness (generalized) (M62.81);Difficulty in walking, not elsewhere classified (R26.2)     Time: 5597-4163 PT Time Calculation (min) (ACUTE ONLY): 36 min  Charges:  $Therapeutic Activity: 23-37 mins                     Zenaida Niece, PT, DPT Acute Rehabilitation Pager: [phone]    Zenaida Niece 10/09/2020, 12:52 PM

## 2020-10-10 ENCOUNTER — Inpatient Hospital Stay (HOSPITAL_COMMUNITY): Payer: Medicare Other

## 2020-10-10 DIAGNOSIS — J42 Unspecified chronic bronchitis: Secondary | ICD-10-CM | POA: Diagnosis not present

## 2020-10-10 LAB — CBC WITH DIFFERENTIAL/PLATELET
Abs Immature Granulocytes: 0.01 10*3/uL (ref 0.00–0.07)
Basophils Absolute: 0.1 10*3/uL (ref 0.0–0.1)
Basophils Relative: 1 %
Eosinophils Absolute: 0.9 10*3/uL — ABNORMAL HIGH (ref 0.0–0.5)
Eosinophils Relative: 12 %
HCT: 31.5 % — ABNORMAL LOW (ref 39.0–52.0)
Hemoglobin: 10.6 g/dL — ABNORMAL LOW (ref 13.0–17.0)
Immature Granulocytes: 0 %
Lymphocytes Relative: 28 %
Lymphs Abs: 2.1 10*3/uL (ref 0.7–4.0)
MCH: 29.1 pg (ref 26.0–34.0)
MCHC: 33.7 g/dL (ref 30.0–36.0)
MCV: 86.5 fL (ref 80.0–100.0)
Monocytes Absolute: 0.8 10*3/uL (ref 0.1–1.0)
Monocytes Relative: 11 %
Neutro Abs: 3.6 10*3/uL (ref 1.7–7.7)
Neutrophils Relative %: 48 %
Platelets: 166 10*3/uL (ref 150–400)
RBC: 3.64 MIL/uL — ABNORMAL LOW (ref 4.22–5.81)
RDW: 14.7 % (ref 11.5–15.5)
WBC: 7.4 10*3/uL (ref 4.0–10.5)
nRBC: 0 % (ref 0.0–0.2)

## 2020-10-10 LAB — COMPREHENSIVE METABOLIC PANEL
ALT: 9 U/L (ref 0–44)
AST: 14 U/L — ABNORMAL LOW (ref 15–41)
Albumin: 2.6 g/dL — ABNORMAL LOW (ref 3.5–5.0)
Alkaline Phosphatase: 54 U/L (ref 38–126)
Anion gap: 7 (ref 5–15)
BUN: 21 mg/dL (ref 8–23)
CO2: 25 mmol/L (ref 22–32)
Calcium: 8.7 mg/dL — ABNORMAL LOW (ref 8.9–10.3)
Chloride: 104 mmol/L (ref 98–111)
Creatinine, Ser: 1.71 mg/dL — ABNORMAL HIGH (ref 0.61–1.24)
GFR, Estimated: 38 mL/min — ABNORMAL LOW (ref >60)
Glucose, Bld: 115 mg/dL — ABNORMAL HIGH (ref 70–99)
Potassium: 4.3 mmol/L (ref 3.5–5.1)
Sodium: 136 mmol/L (ref 135–145)
Total Bilirubin: 0.7 mg/dL (ref 0.3–1.2)
Total Protein: 5.4 g/dL — ABNORMAL LOW (ref 6.5–8.1)

## 2020-10-10 LAB — MAGNESIUM: Magnesium: 2 mg/dL (ref 1.7–2.4)

## 2020-10-10 LAB — PHOSPHORUS: Phosphorus: 3.9 mg/dL (ref 2.5–4.6)

## 2020-10-10 MED ORDER — FOSFOMYCIN TROMETHAMINE 3 G PO PACK: 3.0000 g | PACK | Freq: Once | ORAL | 0 refills | Status: AC

## 2020-10-10 NOTE — TOC Transition Note (Signed)
Transition of Care [facility]) - CM/SW Discharge Note   Patient Details  Name: MOUA RASMUSSON MRN: [id number] Date of Birth: 1931/03/26  Transition of Care [facility]) CM/SW Contact:  Carles Collet, RN Phone Number: 10/10/2020, 1:25 PM   Clinical Narrative:    Damaris Schooner w patient's wife. They would like [facility] for [facility] services, familiar w them from prior use. Referral made and accepted. They ask for a 3/1 for home. Requested through [employer] for delivery to room today prior to DC.  No other CM needs identified.     Final next level of care: [place] Barriers to Discharge: No Barriers Identified   Patient Goals and CMS Choice Patient states their goals for this hospitalization and ongoing recovery are:: to go home CMS Medicare.gov Compare Post Acute Care list provided to:: Other (Comment Required) Choice offered to / list presented to : Spouse  Discharge Placement                       Discharge Plan and Services                DME Arranged: 3-N-1 DME Agency: [employer] Date DME Agency Contacted: 10/10/20 Time DME Agency Contacted: [phone] Representative spoke with at DME Agency: [place]: PT,OT,RN,Nurse's Aide [place]: [place] ([place]) Date [place]: 10/10/20 Time [place]: [place] Representative spoke with at [place]: [place] ([place]) Interventions     Readmission Risk Interventions No flowsheet data found.

## 2020-10-10 NOTE — Discharge Summary (Signed)
Physician Discharge Summary  Steven Cain WUJ:[id number] DOB: Dec 22, 1930 DOA: 10/05/2020  PCP: Steven Baton, MD  Admit date: 10/05/2020 Discharge date: 10/10/2020  Time spent: 40 minutes  Recommendations for Outpatient Follow-up:  1. Follow with outpatient  2. Needs 3 day ziopatch after about 2 weeks  3. Follow with cardiology outpatient for orthostatic hypotension and heart block 4. Behavioral modifications for orthostatic hypotension (as well as postprandial hypotension) 5. Consider medications as indicated for above 6. Follow with neurology outpatient as recommended, though syncope likely 2/2 above (orthostatic hypotension)  Discharge Diagnoses:  Principal Problem:   Seizure ([place]) Active Problems:   Chronic kidney disease, stage 3b ([place])   Essential hypertension   GERD without esophagitis   Prostate cancer metastatic to bone (HCC)   Chronic bronchitis (H[facility])   Cardiac arrhythmia   Second degree AV block, Mobitz type II   Second degree AV block, Mobitz type I   Syncope and collapse   Discharge Condition: stable  Diet recommendation: heart healthy  Filed Weights   10/05/20 0937  Weight: 86.2 kg    History of present illness:  87 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  In the hospital he had an extensive workup with MRI/EEG and neurology c/s, which were unrevealing for neurologic etiology.  He had mobitz 1 and 2:1 block seen on tele and cardiology/EP were consulted.  EP felt his symptoms were not due to heart block, but instead likely 2/2 orthostatic hypotension.  Abdominal binders/thigh high compression stockings started.  D/c'd on 2/27 in stable condition.  See below for  additional details  Hospital Course:  Bradycardia  Conduction Disease Hx 1st degree AV block, but now with mobitz I/II seen on telemetry  Per EP, mobitz 1 and 2:1 AV block seen (unlikely alternative primary arrhythmia concurrently) Appreciate EP assistance -> suspects profound orthostatic hypotension likely cause for below and no clear indication for pacing.  Recommended holding amlodipine and reassessing heart rate in about 2 weeks with ziopatch.  See below. TSH wnl D/c amlodipine Echocardiogram (EF 55-60%, moderately elevated PASP, borderline dilatation of aortic root )  Orthostatic Hypotension  Suspected Postprandial Hypotension Appreciate cardiology/EP recs Persistent today, down from 160-70 to 110's with standing Thigh high ted hose, abdominal binders, elevate head of bed.  Discussed minimizing standing at home if possible (using urinal).  Discussed spending time in chair.  Appreciate cards recs. Behavioral modifications for suspected postprandial hypotension as well Follow orthostatics daily Difficult situation overall, will plan to d/c today - he's mostly bedbound at home, SBP dropped notably today, but still with supine hypertension - will hold off on meds at this point.  Discussed behavioral modifications with pt and wife today.  Recommending outpatient follow up with cards.    Syncope  Seizure Like Activity: appreciate neurology consultation, suspicion at this time is 2/2 syncopal convulsion or convulsions in setting of arrhythmia.  Due to above.  EEG without seizures or epileptiform discharges MRI brain with mild cortically based DWI hyperintensity involving R parietal and occipitotemporal regions, may reflect postictal sequela.  No intracranial hemorrhage.  Chronic R cerebellar insult.  R V4 segment hyperintensity. (suspected DWI changes artifactual per neurology) D/c keppra at this time per neurology Outpatient follow up with [facility] CXR with possible small R  effusion,  no focal consolidation  Acute Metabolic Encephalopathy Mild hospital delirium it seems Follow urine culture -> growing pseudomonas, treating as below Delirium precautions W/u additionally as indicated -> this seems resolved, back to his baseline  Concern for torsades Likely artifact per cards  Pseudomonas UTI UA with positive nitrite (follow cultures) Cefepime -> will complete therapy with fosfomycin   AKI on CKD IIIb Baseline creatinine unclear, but recently 1.7-1.9 Close to baseline Follow  Prostate Cancer metastatic to bone Follow outpatient  Chronic bronchitis Continue home meds  Procedures: Echo IMPRESSIONS    1. Left ventricular ejection fraction, by estimation, is 55 to 60%. The  left ventricle has normal function. The left ventricle has no regional  wall motion abnormalities. Diastolic function is not assessed due to AV  block.  2. Right ventricular systolic function is low normal. The right  ventricular size is normal. There is moderately elevated pulmonary artery  systolic pressure. The estimated right ventricular systolic pressure is  30.8 mmHg.  3. The mitral valve is grossly normal. Trivial mitral valve  regurgitation. No evidence of mitral stenosis.  4. The aortic valve is tricuspid. Aortic valve regurgitation is trivial.  Mild aortic valve sclerosis is present, with no evidence of aortic valve  stenosis.  5. Aortic Normal proximal ascending aorta. There is borderline dilatation  of the aortic root, measuring 39 mm.  6. The inferior vena cava is normal in size with greater than 50%  respiratory variability, suggesting right atrial pressure of 3 mmHg.   EEG IMPRESSION: This study is within normal limits. No seizures or epileptiform discharges were seen throughout the recording.  Consultations:  Cardiology  EP  neurology  Discharge Exam: Vitals:   10/10/20 0440 10/10/20 0802  BP: (!) 162/54 (!) 164/66  Pulse: (!) 59 71   Resp: 16 19  Temp: 98.7 F (37.1 C) (!) 97.5 F (36.4 C)  SpO2: 94% 93%   Asking to go home No new complaints Discussed plan of care with wife and patient Called Steven Cain, no answer today  General: No acute distress. Cardiovascular: brady Lungs: Clear to auscultation bilaterally Abdomen: Soft, nontender, nondistended Neurological: Alert and oriented 3. Moves all extremities 4. Cranial nerves II through XII grossly intact. Skin: Warm and dry. No rashes or lesions. Extremities: No clubbing or cyanosis. No edema.  Discharge Instructions   Discharge Instructions    Ambulatory referral to Cardiac Electrophysiology   Complete by: As directed    Heart block and orthostatic hypotension   Ambulatory referral to Neurology   Complete by: As directed    An appointment is requested in approximately: 2 weeks   Call MD for:  difficulty breathing, headache or visual disturbances   Complete by: As directed    Call MD for:  extreme fatigue   Complete by: As directed    Call MD for:  hives   Complete by: As directed    Call MD for:  persistant dizziness or light-headedness   Complete by: As directed    Call MD for:  persistant nausea and vomiting   Complete by: As directed    Call MD for:  redness, tenderness, or signs of infection (pain, swelling, redness, odor or green/yellow discharge around incision site)   Complete by: As directed    Call MD for:  severe uncontrolled pain   Complete by: As directed    Call MD for:  temperature >100.4   Complete by: As directed    Diet - low sodium heart healthy   Complete  by: As directed    Discharge instructions   Complete by: As directed    You have significant orthostatic hypotension.    We've begun to treat this with abdominal binders and thigh high compression stockings.  You've purchased thigh sleeves to use at home.  You continue to have orthostasis (with significant change in blood pressure when you go from lying to sitting and/or  standing).  I think you also have postprandial hypotension (low blood pressure after you eat).  Some things that you can do to mitigate these symptoms are as follows: * Elevated the head of the bed * Consider minimizing standing (using Ashelyn Mccravy urinal in bed) and ensuring you have assistance anytime that you did need to stand up * spend more time upright as tolerated (in Jaymien Landin chair) * you may need medications eventually for your orthostatic hypotension, follow with your cardiologist and PCP to discuss these options * for your post prandial hypotension (low blood pressures after eating)  - consider avoiding large meals, avoiding activities or sudden standing after meals, avoiding alcohol, ingesting meals low in carbohydrates, and drinking water with meals.  Medications can also be considered for these symptoms (follow with your cardiologist and your PCP)  Your bradycardia and heart block were not thought to be related to your syncope.  Follow outpatient with cardiology in about 2 weeks to arrange an outpatient cardiac event monitor after your amlodipine is out of your system.  You were treated for Danyeal Akens UTI while admitted.  We've discharged you with Fiona Coto dose of fosfomycin to complete your therapy.  Follow with neurology outpatient for your syncopal episode.  Please ask your PCP to request records from this hospitalization so they know what was done and what the next steps will be.   Face-to-face encounter (required for Medicare/Medicaid patients)   Complete by: As directed    I Fayrene Helper certify that this patient is under my care and that I, or Jaasiel Hollyfield nurse practitioner or physician's assistant working with me, had Britney Newstrom face-to-face encounter that meets the physician face-to-face encounter requirements with this patient on 10/10/2020. The encounter with the patient was in whole, or in part for the following medical condition(s) which is the primary reason for home health care (List medical condition): syncope,  orthostatic hypotension   The encounter with the patient was in whole, or in part, for the following medical condition, which is the primary reason for home health care: syncope, orthostatic hypotension   I certify that, based on my findings, the following services are medically necessary home health services:  Nursing Physical therapy     Reason for Medically Necessary Home Health Services: Therapy- Therapeutic Exercises to Increase Strength and Endurance   My clinical findings support the need for the above services: Unsafe ambulation due to balance issues   Further, I certify that my clinical findings support that this patient is homebound due to: Unable to leave home safely without assistance   Home Health   Complete by: As directed    To provide the following care/treatments:  PT OT RN [place]     Increase activity slowly   Complete by: As directed      Allergies as of 10/10/2020      Reactions   Other Other (See Comments)   Bee stings - unknown      Medication List    STOP taking these medications   amLODipine 10 MG tablet Commonly known as: NORVASC   traMADol 50 MG tablet Commonly  known as: Ultram     TAKE these medications   acetaminophen 500 MG tablet Commonly known as: TYLENOL Take 1 tablet (500 mg total) by mouth every 6 (six) hours as needed for mild pain.   albuterol 108 (90 Base) MCG/ACT inhaler Commonly known as: VENTOLIN HFA Inhale 2 puffs into the lungs every 6 (six) hours as needed for wheezing or shortness of breath.   aspirin EC 81 MG tablet Take 81 mg by mouth daily.   cyanocobalamin 1000 MCG/ML injection Commonly known as: (VITAMIN B-12) Inject 1,000 mcg into the muscle every 30 (thirty) days.   fish oil-omega-3 fatty acids 1000 MG capsule Take 1 g by mouth 2 (two) times Trevion Hoben week.   fosfomycin 3 g Pack Commonly known as: MONUROL Take 3 g by mouth once for 1 dose. Start taking on: October 12, 2020   glycerin adult 2 g  suppository Place 1 suppository rectally as needed for constipation.   guaiFENesin 600 MG 12 hr tablet Commonly known as: MUCINEX Take 1,200 mg by mouth 2 (two) times daily.   hydroxypropyl methylcellulose / hypromellose 2.5 % ophthalmic solution Commonly known as: ISOPTO TEARS / GONIOVISC Place 1 drop into both eyes daily as needed for dry eyes.   Magnesium 100 MG Tabs Take 100 mg by mouth daily.   meclizine 25 MG tablet Commonly known as: ANTIVERT Take 25 mg by mouth daily as needed for dizziness.   multivitamin with minerals Tabs tablet Take 1 tablet by mouth every morning.   NEURIVA PO Take 1 capsule by mouth daily.   PARoxetine 40 MG tablet Commonly known as: PAXIL Take 1 tablet (40 mg total) by mouth daily. What changed: when to take this   psyllium 95 % Pack Commonly known as: HYDROCIL/METAMUCIL Take 1 packet by mouth daily.   senna-docusate 8.6-50 MG tablet Commonly known as: Senokot-S Take 1 tablet by mouth at bedtime. What changed:   how much to take  when to take this  reasons to take this   traZODone 50 MG tablet Commonly known as: DESYREL Take 50 mg by mouth at bedtime.   vitamin C 1000 MG tablet Take 2,000 mg by mouth daily.   Vitamin D 50 MCG (2000 UT) Caps Take 4,000 Units by mouth daily.   vitamin E 45 MG (100 UNITS) capsule Take 100 Units by mouth every other day.      Allergies  Allergen Reactions  . Other Other (See Comments)    Bee stings - unknown    Follow-up Information    Tolia, Sunit, DO Follow up on 10/19/2020.   Specialties: Cardiology, Radiology, Vascular Surgery Why: Appointment at 1:30pm. Please bring all medications.  Contact information: [address] [place] [address] [phone]                The results of significant diagnostics from this hospitalization (including imaging, microbiology, ancillary and laboratory) are listed below for reference.    Significant Diagnostic  Studies: DG Chest 1 View  Result Date: 10/05/2020 CLINICAL DATA:  Concern for pneumonia EXAM: CHEST  1 VIEW COMPARISON:  Chest radiograph June 11, 2018 FINDINGS: The patient is rotated. The heart size and mediastinal contours are within normal limits. Low lung volumes with bibasilar subsegmental atelectasis superimposed on chronic bronchitic changes. Possible small right pleural effusion. Increased globular calcifications about the left shoulder. Left greater than right glenohumeral joint arthropathy. Surgical clips overlie the upper abdomen. IMPRESSION: 1. Low lung volumes with bibasilar subsegmental atelectasis superimposed  on chronic bronchitic changes. No focal consolidation. 2. Possible small right pleural effusion versus scarring. Electronically Signed   By: Dahlia Bailiff MD   On: 10/05/2020 17:12   CT HEAD WO CONTRAST  Result Date: 10/05/2020 CLINICAL DATA:  Delirium, seizure EXAM: CT HEAD WITHOUT CONTRAST TECHNIQUE: Contiguous axial images were obtained from the base of the skull through the vertex without intravenous contrast. COMPARISON:  None. FINDINGS: Brain: No acute infarct or intracranial hemorrhage. No mass lesion. No midline shift, ventriculomegaly or extra-axial fluid collection. Chronic right cerebellar lacunar insult. Vascular: No hyperdense vessel or unexpected calcification. Bilateral skull base atherosclerotic calcifications. Skull: Negative for fracture or focal lesion. Sinuses/Orbits: No acute finding. Mild left maxillary sinus hyperostosis. Clear paranasal sinuses. Other: None. IMPRESSION: No acute intracranial process. Chronic right cerebellar lacunar insult. Electronically Signed   By: Primitivo Gauze M.D.   On: 10/05/2020 10:11   MR BRAIN WO CONTRAST  Result Date: 10/05/2020 CLINICAL DATA:  Seizure, abnormal neuro exam EXAM: MRI HEAD WITHOUT CONTRAST TECHNIQUE: Multiplanar, multiecho pulse sequences of the brain and surrounding structures were obtained without  intravenous contrast. COMPARISON:  10/05/2020. FINDINGS: Brain: Mild cortically based DWI hyperintensity involving the right parietal and occipitotemporal regions. No intracranial hemorrhage. No midline shift, ventriculomegaly or extra-axial fluid collection. No mass lesion. Chronic right cerebellar insult. Mild cerebral atrophy with ex vacuo dilatation. Vascular: Right V4 segment T2 hyperintense signal. Remaining intracranial major arterial flow voids are proximally preserved. Skull and upper cervical spine: Normal marrow signal. Sinuses/Orbits: Normal orbits. Clear paranasal sinuses. Pneumatized mastoid air cells. Other: Please note image quality is mildly degraded by motion artifact. IMPRESSION: Mild cortically based DWI hyperintensity involving the right parietal and occipitotemporal regions may reflect postictal sequela. No intracranial hemorrhage.  Chronic right cerebellar insult. Right V4 segment hyperintensity, slow flow versus chronic occlusion. Electronically Signed   By: Primitivo Gauze M.D.   On: 10/05/2020 13:27   EEG adult  Result Date: 10/06/2020 Lora Havens, MD     10/06/2020  1:13 PM Patient Name: AYOMIDE PURDY MRN: [id number] Epilepsy Attending: Lora Havens Referring Physician/Provider: Dr. Fayrene Helper Date: 10/06/2020 Duration: 23.30 minutes Patient history: 87 year old male presented with seizure-like activity.  EEG to evaluate for seizures. Level of alertness: Awake, asleep AEDs during EEG study: Keppra Technical aspects: This EEG study was done with scalp electrodes positioned according to the 10-20 International system of electrode placement. Electrical activity was acquired at Emiliya Chretien sampling rate of 500Hz  and reviewed with Lindbergh Winkles high frequency filter of 70Hz  and Hilari Wethington low frequency filter of 1Hz . EEG data were recorded continuously and digitally stored. Description: The posterior dominant rhythm consists of 8 Hz activity of moderate voltage (25-35 uV) seen predominantly in posterior  head regions, symmetric and reactive to eye opening and eye closing. Sleep was characterized by vertex waves, sleep spindles (12 to 14 Hz), maximal frontocentral region. Hyperventilation and photic stimulation were not performed.   IMPRESSION: This study is within normal limits. No seizures or epileptiform discharges were seen throughout the recording. Lora Havens   ECHOCARDIOGRAM COMPLETE  Result Date: 10/07/2020    ECHOCARDIOGRAM REPORT   Patient Name:   DR. Olegario Shearer Date of Exam: 10/06/2020 Medical Rec #:  [id number]           Height:       65.0 in Accession #:    [id number]          Weight:       190.0 lb Date of Birth:  [birth year]  BSA:          1.936 m Patient Age:    22 years            BP:           129/51 mmHg Patient Gender: M                   HR:           38 bpm. Exam Location:  Inpatient Procedure: 2D Echo, Cardiac Doppler and Color Doppler Indications:    R94.31 Abnormal EKG  History:        Patient has prior history of Echocardiogram examinations, most                 recent 03/23/2017. Signs/Symptoms:Dyspnea; Risk                 Factors:Hypertension.  Sonographer:    Bernadene Person RDCS Referring Phys: [id number] [facility]  Sonographer Comments: Suboptimal parasternal window, suboptimal apical window, patient is morbidly obese and Technically difficult study due to poor echo windows. IMPRESSIONS  1. Left ventricular ejection fraction, by estimation, is 55 to 60%. The left ventricle has normal function. The left ventricle has no regional wall motion abnormalities. Diastolic function is not assessed due to AV block.  2. Right ventricular systolic function is low normal. The right ventricular size is normal. There is moderately elevated pulmonary artery systolic pressure. The estimated right ventricular systolic pressure is 75.1 mmHg.  3. The mitral valve is grossly normal. Trivial mitral valve regurgitation. No evidence of mitral stenosis.  4. The aortic valve is tricuspid.  Aortic valve regurgitation is trivial. Mild aortic valve sclerosis is present, with no evidence of aortic valve stenosis.  5. Aortic Normal proximal ascending aorta. There is borderline dilatation of the aortic root, measuring 39 mm.  6. The inferior vena cava is normal in size with greater than 50% respiratory variability, suggesting right atrial pressure of 3 mmHg. FINDINGS  Left Ventricle: Left ventricular ejection fraction, by estimation, is 55 to 60%. The left ventricle has normal function. The left ventricle has no regional wall motion abnormalities. The left ventricular internal cavity size was normal in size. There is  no left ventricular hypertrophy. Diastolic function is not assessed due to AV block. Right Ventricle: The right ventricular size is normal. No increase in right ventricular wall thickness. Right ventricular systolic function is low normal. There is moderately elevated pulmonary artery systolic pressure. The tricuspid regurgitant velocity  is 3.46 m/s, and with an assumed right atrial pressure of 3 mmHg, the estimated right ventricular systolic pressure is 70.0 mmHg. Left Atrium: Left atrial size was normal in size. Right Atrium: Right atrial size was normal in size. Pericardium: There is no evidence of pericardial effusion. Mitral Valve: The mitral valve is grossly normal. Trivial mitral valve regurgitation. No evidence of mitral valve stenosis. Tricuspid Valve: The tricuspid valve is grossly normal. Tricuspid valve regurgitation is mild . No evidence of tricuspid stenosis. Aortic Valve: The aortic valve is tricuspid. Aortic valve regurgitation is trivial. Mild aortic valve sclerosis is present, with no evidence of aortic valve stenosis. Pulmonic Valve: The pulmonic valve was not well visualized. Pulmonic valve regurgitation is trivial. No evidence of pulmonic stenosis. Aorta: Normal proximal ascending aorta. There is borderline dilatation of the aortic root, measuring 39 mm. Venous: The  inferior vena cava is normal in size with greater than 50% respiratory variability, suggesting right atrial pressure of 3 mmHg. IAS/Shunts: The atrial septum  is grossly normal.  LEFT VENTRICLE PLAX 2D LVIDd:         4.70 cm  Diastology LVIDs:         2.70 cm  LV e' medial:    11.20 cm/s LV PW:         0.80 cm  LV E/e' medial:  8.9 LV IVS:        0.80 cm  LV e' lateral:   19.60 cm/s LVOT diam:     1.90 cm  LV E/e' lateral: 5.1 LV SV:         68 LV SV Index:   35 LVOT Area:     2.84 cm  RIGHT VENTRICLE RV S prime:     8.81 cm/s TAPSE (M-mode): 1.9 cm LEFT ATRIUM             Index       RIGHT ATRIUM           Index LA diam:        3.00 cm 1.55 cm/m  RA Area:     19.00 cm LA Vol (A2C):   38.1 ml 19.68 ml/m RA Volume:   60.50 ml  31.26 ml/m LA Vol (A4C):   37.7 ml 19.48 ml/m LA Biplane Vol: 40.7 ml 21.03 ml/m  AORTIC VALVE LVOT Vmax:   82.00 cm/s LVOT Vmean:  66.700 cm/s LVOT VTI:    0.241 m  AORTA Ao Root diam: 3.90 cm Ao Asc diam:  3.60 cm MITRAL VALVE               TRICUSPID VALVE MV Area (PHT): 2.87 cm    TR Peak grad:   47.9 mmHg MV Decel Time: 264 msec    TR Vmax:        346.00 cm/s MV E velocity: 99.40 cm/s MV Alvira Hecht velocity: 52.90 cm/s  SHUNTS MV E/Amori Colomb ratio:  1.88        Systemic VTI:  0.24 m                            Systemic Diam: 1.90 cm Sunit Tolia DO Electronically signed by Rex Kras DO Signature Date/Time: 10/07/2020/8:01:01 PM    Final     Microbiology: Recent Results (from the past 240 hour(s))  Resp Panel by RT-PCR (Flu Pansie Guggisberg&B, Covid) Nasopharyngeal Swab     Status: None   Collection Time: 10/05/20  2:19 PM   Specimen: Nasopharyngeal Swab; Nasopharyngeal(NP) swabs in vial transport medium  Result Value Ref Range Status   SARS Coronavirus 2 by RT PCR NEGATIVE NEGATIVE Final    Comment: (NOTE) SARS-CoV-2 target nucleic acids are NOT DETECTED.  The SARS-CoV-2 RNA is generally detectable in upper respiratory specimens during the acute phase of infection. The lowest concentration of  SARS-CoV-2 viral copies this assay can detect is 138 copies/mL. Deshaun Schou negative result does not preclude SARS-Cov-2 infection and should not be used as the sole basis for treatment or other patient management decisions. Salvatore Poe negative result may occur with  improper specimen collection/handling, submission of specimen other than nasopharyngeal swab, presence of viral mutation(s) within the areas targeted by this assay, and inadequate number of viral copies(<138 copies/mL). Talissa Apple negative result must be combined with clinical observations, patient history, and epidemiological information. The expected result is Negative.  Fact Sheet for Patients:  EntrepreneurPulse.com.au  Fact Sheet for Healthcare Providers:  IncredibleEmployment.be  This test is no t yet approved or cleared by the [place]  FDA and  has been authorized for detection and/or diagnosis of SARS-CoV-2 by FDA under an Emergency Use Authorization (EUA). This EUA will remain  in effect (meaning this test can be used) for the duration of the COVID-19 declaration under Section 564(b)(1) of the Act, 21 U.S.C.section 360bbb-3(b)(1), unless the authorization is terminated  or revoked sooner.       Influenza Cathe Bilger by PCR NEGATIVE NEGATIVE Final   Influenza B by PCR NEGATIVE NEGATIVE Final    Comment: (NOTE) The Xpert Xpress SARS-CoV-2/FLU/RSV plus assay is intended as an aid in the diagnosis of influenza from Nasopharyngeal swab specimens and should not be used as Barabara Motz sole basis for treatment. Nasal washings and aspirates are unacceptable for Xpert Xpress SARS-CoV-2/FLU/RSV testing.  Fact Sheet for Patients: EntrepreneurPulse.com.au  Fact Sheet for Healthcare Providers: IncredibleEmployment.be  This test is not yet approved or cleared by the [place] FDA and has been authorized for detection and/or diagnosis of SARS-CoV-2 by FDA under an Emergency Use  Authorization (EUA). This EUA will remain in effect (meaning this test can be used) for the duration of the COVID-19 declaration under Section 564(b)(1) of the Act, 21 U.S.C. section 360bbb-3(b)(1), unless the authorization is terminated or revoked.  Performed at [facility], [place] [address] [place], [place] [address]   Culture, Urine     Status: Abnormal   Collection Time: 10/07/20 12:07 AM   Specimen: Urine, Random  Result Value Ref Range Status   Specimen Description URINE, RANDOM  Final   Special Requests   Final    NONE Performed at [facility], [place] [address] [place], [place] [address]    Culture >=100,000 COLONIES/mL PSEUDOMONAS AERUGINOSA (Marchello Rothgeb)  Final   Report Status 10/09/2020 FINAL  Final   Organism ID, Bacteria PSEUDOMONAS AERUGINOSA (Jaylene Schrom)  Final      Susceptibility   Pseudomonas aeruginosa - MIC*    CEFTAZIDIME 2 SENSITIVE Sensitive     CIPROFLOXACIN <=0.25 SENSITIVE Sensitive     GENTAMICIN <=1 SENSITIVE Sensitive     IMIPENEM <=0.25 SENSITIVE Sensitive     PIP/TAZO <=4 SENSITIVE Sensitive     CEFEPIME 1 SENSITIVE Sensitive     * >=100,000 COLONIES/mL PSEUDOMONAS AERUGINOSA     Labs: Basic Metabolic Panel: Recent Labs  Lab 10/06/20 0339 10/07/20 0331 10/08/20 0356 10/09/20 0254 10/10/20 0216  NA 138 136 135 133* 136  K 3.8 4.5 4.5 5.2* 4.3  CL 104 102 102 98 104  CO2 26 23 23 27 25   GLUCOSE 113* 100* 99 109* 115*  BUN 18 19 16 19 21   CREATININE 1.58* 1.69* 1.53* 1.67* 1.71*  CALCIUM 8.9 9.3 8.9 8.8* 8.7*  MG 2.7* 2.2 2.0 2.0 2.0  PHOS  --  3.4 3.5 3.4 3.9   Liver Function Tests: Recent Labs  Lab 10/06/20 0339 10/07/20 0331 10/08/20 0356 10/09/20 0254 10/10/20 0216  AST 22 19 21 17  14*  ALT 11 12 9 7 9   ALKPHOS 69 70 63 62 54  BILITOT 0.4 0.6 1.0 0.7 0.7  PROT 5.6* 6.4* 6.3* 5.8* 5.4*  ALBUMIN 2.7* 3.1* 2.9* 2.7* 2.6*   No results for input(s): LIPASE, AMYLASE in the last 168 hours. No results for input(s): AMMONIA in the last  168 hours. CBC: Recent Labs  Lab 10/06/20 0339 10/07/20 0331 10/08/20 0356 10/09/20 0254 10/10/20 0216  WBC 8.4 10.8* 8.9 8.2 7.4  NEUTROABS 5.0 6.2 4.9 4.4 3.6  HGB 10.9* 12.2* 12.5* 11.5* 10.6*  HCT 32.3* 37.7* 36.2*  33.9* 31.5*  MCV 85.0 86.1 84.6 85.4 86.5  PLT 181 183 146* 182 166   Cardiac Enzymes: No results for input(s): CKTOTAL, CKMB, CKMBINDEX, TROPONINI in the last 168 hours. BNP: BNP (last 3 results) No results for input(s): BNP in the last 8760 hours.  ProBNP (last 3 results) No results for input(s): PROBNP in the last 8760 hours.  CBG: No results for input(s): GLUCAP in the last 168 hours.     Signed:  Fayrene Helper MD.  [facility] 10/10/2020, 12:45 PM

## 2020-10-10 NOTE — Progress Notes (Signed)
Patient prepared for discharge. Tele and IV removed. Discharged information reviewed with patient's wife at bedside. Patient has all belongings and BSC. Patient's wife says family member will be here to pick them up shortly. Patient will be taken to vehicle in a wheelchair.

## 2020-10-11 ENCOUNTER — Telehealth: Payer: Self-pay

## 2020-10-11 NOTE — Telephone Encounter (Signed)
-----   Message from Kem Boroughs sent at 10/08/2020 11:40 AM EST ----- Regarding: TOC Scheduled for 10/19/20 at 1:30

## 2020-10-11 NOTE — Telephone Encounter (Signed)
Also make sure that he follows up with Dr. Caryl Comes or Dr. Allegra Lai posthospitalization.

## 2020-10-13 ENCOUNTER — Encounter: Payer: Self-pay | Admitting: Internal Medicine

## 2020-10-13 ENCOUNTER — Other Ambulatory Visit: Payer: Self-pay

## 2020-10-13 ENCOUNTER — Ambulatory Visit (INDEPENDENT_AMBULATORY_CARE_PROVIDER_SITE_OTHER): Payer: Medicare Other | Admitting: Internal Medicine

## 2020-10-13 VITALS — BP 144/72 | HR 40 | Ht 65.0 in | Wt 174.4 lb

## 2020-10-13 DIAGNOSIS — R55 Syncope and collapse: Secondary | ICD-10-CM

## 2020-10-13 DIAGNOSIS — I441 Atrioventricular block, second degree: Secondary | ICD-10-CM | POA: Diagnosis not present

## 2020-10-13 DIAGNOSIS — I951 Orthostatic hypotension: Secondary | ICD-10-CM

## 2020-10-13 NOTE — Patient Instructions (Addendum)
Medication Instructions:  Your physician recommends that you continue on your current medications as directed. Please refer to the Current Medication list given to you today.  *If you need a refill on your cardiac medications before your next appointment, please call your pharmacy*   Lab Work: None ordered.  If you have labs (blood work) drawn today and your tests are completely normal, you will receive your results only by: Marland Kitchen MyChart Message (if you have MyChart) OR . A paper copy in the mail If you have any lab test that is abnormal or we need to change your treatment, we will call you to review the results.   Testing/Procedures: None ordered.    Follow-Up: At [facility], you and your health needs are our priority.  As part of our continuing mission to provide you with exceptional heart care, we have created designated Provider Care Teams.  These Care Teams include your primary Cardiologist (physician) and Advanced Practice Providers (APPs -  Physician Assistants and Nurse Practitioners) who all work together to provide you with the care you need, when you need it.  We recommend signing up for the patient portal called "MyChart".  Sign up information is provided on this After Visit Summary.  MyChart is used to connect with patients for Virtual Visits (Telemedicine).  Patients are able to view lab/test results, encounter notes, upcoming appointments, etc.  Non-urgent messages can be sent to your provider as well.   To learn more about what you can do with MyChart, go to [email or web address].    Your next appointment:    Virtual visit 11/18/2020 at 315pm with Dr Caryl Comes

## 2020-10-13 NOTE — Progress Notes (Signed)
Patient Care Team: Shon Baton, MD as PCP - General (Internal Medicine)   HPI  Steven Cain is a 87 y.o. male Seen following hospitalization for syncope wherein he was noted to have bradycardia with Mobitz type I heart block.  However, on more extensive history taking, his syncope was associated with standing and indeed had profound orthostatic syncope with a orthostatic decrease in systolic pressure of about 70-80 mm to a nadir of 70 mm.  Lengthy discussions were had regarding pharmacological versus nonpharmacological therapies given his very limited activity and the fact that he has been largely bedbound.  The family elected to be more aggressive that has gotten him abdominal binders and thigh sleeves which has improved his ability to get from the bed to the bathroom with less "unbalance "    Records and Results Reviewed   Past Medical History:  Diagnosis Date  . Abnormal EKG    hx of left anterior fasicular block on ekg, no cardiologist  . Anxiety   . AR (aortic regurgitation) 03/23/2017   Mild, Noted on ECHO  . Arthritis    DJD  . Benign enlargement of prostate    frequent UTI, trim of prostate done , with some issues of incontinence now.  . Chronic bronchitis ([place])    chronic- usually has phelgm often  . COVID-19 08/2019   fatigue and loss of taste and smell x 10 days did monoclonal antibody therapy  . Depression   . Dyspnea    with excertion  . Fluid retention in legs    resolved  . GERD (gastroesophageal reflux disease)    rare  . GI bleed   . Grade I diastolic dysfunction   . Hard of hearing    hear more on left side- no hearing aid  . History of colon polyps   . History of transfusion 2016  . Hydronephrosis 2018   Left ckd stage 3 no nephrologist per wife [place]  . Hypertension   . Moderate concentric left ventricular hypertrophy 03/23/2017   Noted on ECHO  . Perforated, severe stomach ulcer ([place]) 1974  . Peripheral neuropathy    feet and toes   . Spinal stenosis   . TR (tricuspid regurgitation) 03/23/2017   Mild, Noted on ECHO  . Transfusion history    none recent  . Ureteral stricture    CHRONIC  . Urinary incontinence   . UTI (urinary tract infection)    frequent  . Wears glasses     Past Surgical History:  Procedure Laterality Date  . ABDOMINAL SURGERY     bilroths tube '74  . COLONOSCOPY N/A 09/29/2014   Procedure: COLONOSCOPY;  Surgeon: Beryle Beams, MD;  Location: [facility];  Service: Endoscopy;  Laterality: N/A;  . CYSTOSCOPY W/ URETERAL STENT PLACEMENT Left 05/06/2014   Procedure: CYSTOSCOPY WITH RETROGRADE PYELOGRAM/URETERAL STENT PLACEMENT;  Surgeon: [name], MD;  Location: [facility];  Service: Urology;  Laterality: Left;  . CYSTOSCOPY W/ URETERAL STENT PLACEMENT Left 03/31/2015   Procedure: CYSTOSCOPY WITH LEFT RETROGRADE PYELOGRAM/URETERAL STENT EXCHANGE;  Surgeon: [name], MD;  Location: [facility];  Service: Urology;  Laterality: Left;  . CYSTOSCOPY W/ URETERAL STENT PLACEMENT Left 09/15/2015   Procedure: CYSTOSCOPY WITH LEFT RETROGRADE PYELOGRAM/URETERAL STENT EXCHANGE ;  Surgeon: [name], MD;  Location: [facility];  Service: Urology;  Laterality: Left;  . CYSTOSCOPY W/ URETERAL STENT PLACEMENT Left 04/28/2016   Procedure: CYSTOSCOPY WITH RETROGRADE PYELOGRAM/URETERAL STENT EXCHANGE;  Surgeon: [name], MD;  Location: [facility];  Service: Urology;  Laterality: Left;  . CYSTOSCOPY W/ URETERAL STENT PLACEMENT Left 11/08/2016   Procedure: CYSTOSCOPY WITH RETROGRADE PYELOGRAM/URETERAL STENT EXCHANGE;  Surgeon: [name], MD;  Location: [facility];  Service: Urology;  Laterality: Left;  . CYSTOSCOPY W/ URETERAL STENT PLACEMENT Left 09/14/2017   Procedure: CYSTOSCOPY WITH RETROGRADE PYELOGRAM/URETERAL STENT PLACEMENT;  Surgeon: [name], MD;  Location: [facility];  Service: Urology;  Laterality: Left;  . CYSTOSCOPY W/ URETERAL STENT PLACEMENT Left 04/10/2018   Procedure: CYSTOSCOPY WITH LEFT RETROGRADE  URETERAL STENT EXCHANGE;  Surgeon: [name], MD;  [facility];  Service: Urology;  Laterality: Left;  . CYSTOSCOPY W/ URETERAL STENT PLACEMENT Left 01/31/2019   Procedure: CYSTOSCOPY WITH RETROGRADE PYELOGRAM/URETERAL STENT PLACEMENT;  Surgeon: [name], MD;  Location: [facility];  Service: Urology;  Laterality: Left;  45 MINS  . CYSTOSCOPY W/ URETERAL STENT PLACEMENT Left 01/16/2020   Procedure: CYSTOSCOPY WITH RETROGRADE PYELOGRAM/URETERAL STENT EXCHANGED;  Surgeon: [name], MD;  [facility];  Service: Urology;  Laterality: Left;  . CYSTOSCOPY WITH RETROGRADE PYELOGRAM, URETEROSCOPY AND STENT PLACEMENT Left 10/21/2014   Procedure: CYSTOSCOPY WITH RETROGRADE PYELOGRAM, URETEROSCOPY,BIOPSY AND STENT CHANGE;  Surgeon: [name], MD;  Location: [facility];  Service: Urology;  Laterality: Left;  . CYSTOSCOPY/RETROGRADE/URETEROSCOPY Left 06/03/2014   Procedure: CYSTOSCOPY/RETROGRADE/URETEROSCOPY WITH BIOPSY,STENT EXCHANGE;  Surgeon: [name], MD;  Location: [facility];  Service: Urology;  Laterality: Left;  . ESOPHAGOGASTRODUODENOSCOPY N/A 09/28/2014   Procedure: ESOPHAGOGASTRODUODENOSCOPY (EGD);  Surgeon: Beryle Beams, MD;  Location: [facility];  Service: Endoscopy;  Laterality: N/A;  . GASTRIC ROUX-EN-Y     '74  . HERNIA REPAIR     bilateral hernia repairs   . LAPAROSCOPIC APPENDECTOMY N/A 02/15/2020   Procedure: APPENDECTOMY LAPAROSCOPIC;  Surgeon: Jovita Kussmaul, MD;  Location: [facility];  Service: General;  Laterality: N/A;  . TONSILLECTOMY  as child  . TOTAL HIP ARTHROPLASTY Left 05/19/2016   Procedure: LEFT TOTAL HIP ARTHROPLASTY ANTERIOR APPROACH;  Surgeon: Mcarthur Rossetti, MD;  Location: [facility];  Service: Orthopedics;  Laterality: Left;  . TRANSURETHRAL RESECTION OF PROSTATE    . VAGOTOMY  1950's    Current Meds  Medication Sig  . acetaminophen (TYLENOL) 500 MG tablet Take 1 tablet (500 mg total) by mouth every 6  (six) hours as needed for mild pain.  Marland Kitchen albuterol (VENTOLIN HFA) 108 (90 Base) MCG/ACT inhaler Inhale 2 puffs into the lungs every 6 (six) hours as needed for wheezing or shortness of breath.  . Ascorbic Acid (VITAMIN C) 1000 MG tablet Take 2,000 mg by mouth daily.   Marland Kitchen aspirin EC 81 MG tablet Take 81 mg by mouth daily.  . Cholecalciferol (VITAMIN D) 50 MCG (2000 UT) CAPS Take 4,000 Units by mouth daily.  . cyanocobalamin (,VITAMIN B-12,) 1000 MCG/ML injection Inject 1,000 mcg into the muscle every 30 (thirty) days.  . fish oil-omega-3 fatty acids 1000 MG capsule Take 1 g by mouth 2 (two) times a week.   Marland Kitchen glycerin adult 2 g suppository Place 1 suppository rectally as needed for constipation.  Marland Kitchen guaiFENesin (MUCINEX) 600 MG 12 hr tablet Take 1,200 mg by mouth 2 (two) times daily.   . hydroxypropyl methylcellulose / hypromellose (ISOPTO TEARS / GONIOVISC) 2.5 % ophthalmic solution Place 1 drop into both eyes daily as needed for dry eyes.  . Magnesium 100 MG TABS Take 100 mg by mouth daily.  . meclizine (ANTIVERT) 25 MG tablet Take 25 mg by  mouth daily as needed for dizziness.  . Misc Natural Products (NEURIVA PO) Take 1 capsule by mouth daily.  . Multiple Vitamin (MULTIVITAMIN WITH MINERALS) TABS tablet Take 1 tablet by mouth every morning.  Marland Kitchen PARoxetine (PAXIL) 40 MG tablet Take 1 tablet (40 mg total) by mouth daily.  . psyllium (HYDROCIL/METAMUCIL) 95 % PACK Take 1 packet by mouth daily.   Marland Kitchen senna-docusate (SENOKOT-S) 8.6-50 MG tablet Take 1 tablet by mouth at bedtime.  . traZODone (DESYREL) 50 MG tablet Take 50 mg by mouth at bedtime.   . vitamin E 100 UNIT capsule Take 100 Units by mouth every other day.    Allergies  Allergen Reactions  . Other Other (See Comments)    Bee stings - unknown      Review of Systems negative except from HPI and PMH  Physical Exam BP (!) 144/72   Pulse (!) 40   Ht 5\' 5"  (1.651 m)   Wt 174 lb 6.4 oz (79.1 kg)   SpO2 94%   BMI 29.02 kg/m  Well  developed and well nourished in no acute distress HENT normal E scleral and icterus clear Neck Supple JVP flat; carotids brisk and full Clear to ausculation  Regular rate and rhythm, no murmurs gallops or rub Soft with active bowel sounds No clubbing cyanosis  Edema Alert and oriented, grossly normal motor and sensory function Skin Warm and Dry  ECG *none  Estimated Creatinine Clearance: 29.5 mL/min (A) (by C-G formula based on SCr of 1.71 mg/dL (H)).   Assessment and  Plan  Orthostatic syncope  Mobitz 1 second-degree heart block  Hypertension   The patient is much improved with compressive wear started the hospital with an abdominal binder and thigh sleeves.  Off the amlodipine and with the compression, his sitting blood pressures are 140.  We will have him measure supine blood pressures in the morning now that his amlodipine has been discontinued.  In the event that they are greater than 180, would recommend use of a short acting antihypertensive like hydralazine.  Otherwise, would probably allow him to drift into the 160-80s so as to allow an adequate blood pressure him sitting up.  Recommended elevating the head of the bed; out of bed as much time as he can and the use of a CUBII for leg strength      Current medicines are reviewed at length with the patient today .  The patient does not  have concerns regarding medicines.

## 2020-10-19 ENCOUNTER — Ambulatory Visit: Payer: BLUE CROSS/BLUE SHIELD | Admitting: Cardiology

## 2020-10-20 DIAGNOSIS — C61 Malignant neoplasm of prostate: Secondary | ICD-10-CM | POA: Diagnosis not present

## 2020-10-20 DIAGNOSIS — C7951 Secondary malignant neoplasm of bone: Secondary | ICD-10-CM | POA: Diagnosis not present

## 2020-11-09 DIAGNOSIS — R627 Adult failure to thrive: Secondary | ICD-10-CM | POA: Diagnosis not present

## 2020-11-09 DIAGNOSIS — Z66 Do not resuscitate: Secondary | ICD-10-CM | POA: Diagnosis not present

## 2020-11-09 DIAGNOSIS — I441 Atrioventricular block, second degree: Secondary | ICD-10-CM | POA: Diagnosis not present

## 2020-11-09 DIAGNOSIS — I129 Hypertensive chronic kidney disease with stage 1 through stage 4 chronic kidney disease, or unspecified chronic kidney disease: Secondary | ICD-10-CM | POA: Diagnosis not present

## 2020-11-09 DIAGNOSIS — I951 Orthostatic hypotension: Secondary | ICD-10-CM | POA: Diagnosis not present

## 2020-11-09 DIAGNOSIS — R2689 Other abnormalities of gait and mobility: Secondary | ICD-10-CM | POA: Diagnosis not present

## 2020-11-09 DIAGNOSIS — D692 Other nonthrombocytopenic purpura: Secondary | ICD-10-CM | POA: Diagnosis not present

## 2020-11-09 DIAGNOSIS — I700 Atherosclerosis of aorta: Secondary | ICD-10-CM | POA: Diagnosis not present

## 2020-11-09 DIAGNOSIS — R001 Bradycardia, unspecified: Secondary | ICD-10-CM | POA: Diagnosis not present

## 2020-11-09 DIAGNOSIS — N1832 Chronic kidney disease, stage 3b: Secondary | ICD-10-CM | POA: Diagnosis not present

## 2020-11-09 DIAGNOSIS — C7951 Secondary malignant neoplasm of bone: Secondary | ICD-10-CM | POA: Diagnosis not present

## 2020-11-09 DIAGNOSIS — E441 Mild protein-calorie malnutrition: Secondary | ICD-10-CM | POA: Diagnosis not present

## 2020-11-17 ENCOUNTER — Encounter: Payer: Self-pay | Admitting: Neurology

## 2020-11-17 ENCOUNTER — Ambulatory Visit (INDEPENDENT_AMBULATORY_CARE_PROVIDER_SITE_OTHER): Payer: Medicare Other | Admitting: Neurology

## 2020-11-17 ENCOUNTER — Telehealth: Payer: Self-pay | Admitting: Neurology

## 2020-11-17 VITALS — BP 116/75 | HR 84 | Ht 65.0 in | Wt 174.0 lb

## 2020-11-17 DIAGNOSIS — R55 Syncope and collapse: Secondary | ICD-10-CM | POA: Diagnosis not present

## 2020-11-17 DIAGNOSIS — R569 Unspecified convulsions: Secondary | ICD-10-CM

## 2020-11-17 NOTE — Patient Instructions (Signed)
I had a long discussion with the patient and his wife regarding his recent episode of brief loss of consciousness possibly convulsive syncopal event rather than a seizure.  I recommend further evaluation with checking CT angiogram of the brain and neck to rule out significant vascular stenosis as well as repeat EEG.  Follow-up with Dr. Caryl Comes to consider cardiac monitoring or work-up.  I have advised the patient not to drive for the next 6 months as per [facility].  He will return for follow-up in the future in 2 months or call earlier if necessary.  Syncope Syncope is when you pass out (faint) for a short time. It is caused by a sudden decrease in blood flow to the brain. Signs that you may be about to pass out include:  Feeling dizzy or light-headed.  Feeling sick to your stomach (nauseous).  Seeing all white or all black.  Having cold, clammy skin. If you pass out, get help right away. Call your local emergency services (911 in the [place] Do not drive yourself to the hospital. Follow these instructions at home: Watch for any changes in your symptoms. Take these actions to stay safe and help with your symptoms: Lifestyle  Do not drive, use machinery, or play sports until your doctor says it is okay.  Do not drink alcohol.  Do not use any products that contain nicotine or tobacco, such as cigarettes and e-cigarettes. If you need help quitting, ask your doctor.  Drink enough fluid to keep your pee (urine) pale yellow. General instructions  Take over-the-counter and prescription medicines only as told by your doctor.  If you are taking blood pressure or heart medicine, sit up and stand up slowly. Spend a few minutes getting ready to sit and then stand. This can help you feel less dizzy.  Have someone stay with you until you feel stable.  If you start to feel like you might pass out, lie down right away and raise (elevate) your feet above the level of your heart. Breathe deeply  and steadily. Wait until all of the symptoms are gone.  Keep all follow-up visits as told by your doctor. This is important. Get help right away if:  You have a very bad headache.  You pass out once or more than once.  You have pain in your chest, belly, or back.  You have a very fast or uneven heartbeat (palpitations).  It hurts to breathe.  You are bleeding from your mouth or your bottom (rectum).  You have black or tarry poop (stool).  You have jerky movements that you cannot control (seizure).  You are confused.  You have trouble walking.  You are very weak.  You have vision problems. These symptoms may be an emergency. Do not wait to see if the symptoms will go away. Get medical help right away. Call your local emergency services (911 in the [place] Do not drive yourself to the hospital. Summary  Syncope is when you pass out (faint) for a short time. It is caused by a sudden decrease in blood flow to the brain.  Signs that you may be about to faint include feeling dizzy, light-headed, or sick to your stomach, seeing all white or all black, or having cold, clammy skin.  If you start to feel like you might pass out, lie down right away and raise (elevate) your feet above the level of your heart. Breathe deeply and steadily. Wait until all of the symptoms are gone. This  information is not intended to replace advice given to you by your health care provider. Make sure you discuss any questions you have with your health care provider. Document Revised: 09/10/2019 Document Reviewed: 09/12/2017 Elsevier Patient Education  2021 [employer].

## 2020-11-17 NOTE — Telephone Encounter (Signed)
Medicare/bcbs supp order sent to GI. No auth they will reach out to the patient to schedule.  

## 2020-11-17 NOTE — Progress Notes (Signed)
[facility] [address] [place]. 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  Is accompanied by his wife.  History is obtained from them and review of electronic medical records and I personally reviewed available imaging films in PACS.  He has a past medical history of prostate cancer with bony mets currently on testosterone antagonist treatment, grade 1 diastolic dysfunction, hypertension, chronic kidney disease stage III, remote history of seizures in the past in setting of alcohol use or withdrawal with last seizure being 37 years ago and not been on long-term antiepileptics.  He presented to [facility] on 10/06/2020 with an unwitnessed episode.  His wife heard a loud noise and the patient went to the bathroom and was found on the floor unresponsive.  He was gurgling at the mouth with thick phlegm in his throat and appeared to have passed out.  There was a question of momentary loss of pulses.  The daughter on arrival of the nurse noted some tonic-clonic activity.  EMS was called and in route noticed patient had bradycardia and cardiology was consulted after admission his heart rate remained in the low 30s.  He was noted to be into his 2 1 AV block.  MRI scan of the brain was obtained which was unremarkable for any acute abnormalities.  Radiologist on call questioned DWI changes on the cortex on the right concerning for seizure but in my opinion these are likely artifact and neuroradiologist also concurred.  EEG was normal.  Patient apparently has had somewhat similar episode in the past.  Patient stated that this time he remembers that he had still not been  feeling well and discomfort he had eaten something sweet which usually causes him to have a gastric dumping syndrome.  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  His not had any seizures for more than 30 years.  He does have longstanding history of psychiatric disorders including treatment with the ECTs for severe depression in the past.  He has had Roux and Y gastric bypass surgery and does have history of gastric dumping syndrome particularly when he eats certain sweet foods.  Wife is aware of at least 1 more episode of syncope in the past when he became all of a sudden sweaty with sick feeling to his stomach and face and skin went gray and he passed out briefly.  Patient does have longstanding history of gait and balance problems and does admit to feeling dizzy and lightheaded when he extends his neck or turns head quickly to one side.  He also has a tendency to form backwards.  He has not had any recent neurovascular imaging done.  He had an appointment with Dr. Caryl Comes electrophysiologist later on 10/13/2020 who felt patient had orthostatic hypotension causing his syncope as he documented a 70 point systolic drop.  Appeared much improved with compressive wear started in the hospital and abdominal binder and thigh sleeves.  He had echocardiogram on 08/05/2021 which showed ejection fraction of 55 to 60% with no regional wall motion abnormalities.  ROS:   14 system review  of systems is positive for syncope, jerking, loss of consciousness, gait difficulty, balance problems, neck pain increased fall risk and all other systems negative  PMH:  Past Medical History:  Diagnosis Date  . Abnormal EKG    hx of left anterior fasicular block on ekg, no cardiologist  . Anxiety   . AR (aortic regurgitation) 03/23/2017   Mild, Noted on ECHO  . Arthritis    DJD  . Benign enlargement of prostate    frequent UTI, trim of prostate done , with some  issues of incontinence now.  . Chronic bronchitis ([place])    chronic- usually has phelgm often  . COVID-19 08/2019   fatigue and loss of taste and smell x 10 days did monoclonal antibody therapy  . Depression   . Dyspnea    with excertion  . Fluid retention in legs    resolved  . GERD (gastroesophageal reflux disease)    rare  . GI bleed   . Grade I diastolic dysfunction   . Hard of hearing    hear more on left side- no hearing aid  . History of colon polyps   . History of transfusion 2016  . Hydronephrosis 2018   Left ckd stage 3 no nephrologist per wife [place]  . Hypertension   . Moderate concentric left ventricular hypertrophy 03/23/2017   Noted on ECHO  . Perforated, severe stomach ulcer ([place]) 1974  . Peripheral neuropathy    feet and toes  . Spinal stenosis   . TR (tricuspid regurgitation) 03/23/2017   Mild, Noted on ECHO  . Transfusion history    none recent  . Ureteral stricture    CHRONIC  . Urinary incontinence   . UTI (urinary tract infection)    frequent  . Wears glasses     Social History:  Social History   Socioeconomic History  . Marital status: Married    Spouse name: Not on file  . Number of children: Not on file  . Years of education: Not on file  . Highest education level: Not on file  Occupational History  . Not on file  Tobacco Use  . Smoking status: Former Smoker    Types: Cigarettes  . Smokeless tobacco: Former Systems developer    Types: Chew  . Tobacco comment: quit yrs ago  Vaping Use  . Vaping Use: Never used  Substance and Sexual Activity  . Alcohol use: Not Currently  . Drug use: No  . Sexual activity: Not Currently  Other Topics Concern  . Not on file  Social History Narrative  . Not on file   Social Determinants of Health   Financial Resource Strain: Not on file  Food Insecurity: Not on file  Transportation Needs: Not on file  Physical Activity: Not on file  Stress: Not on file  Social Connections: Not on file  Intimate Partner  Violence: Not on file    Medications:   Current Outpatient Medications on File Prior to Visit  Medication Sig Dispense Refill  . acetaminophen (TYLENOL) 500 MG tablet Take 1 tablet (500 mg total) by mouth every 6 (six) hours as needed for mild pain.    Marland Kitchen albuterol (VENTOLIN HFA) 108 (90 Base) MCG/ACT inhaler Inhale 2 puffs into the lungs every 6 (six) hours as needed for wheezing or shortness of breath.    . Ascorbic Acid (VITAMIN C) 1000 MG tablet Take 2,000 mg by mouth daily.     Marland Kitchen aspirin EC 81 MG tablet Take 81 mg by mouth  daily.    . Cholecalciferol (VITAMIN D) 50 MCG (2000 UT) CAPS Take 4,000 Units by mouth daily. (Patient not taking: Reported on 11/17/2020)    . cyanocobalamin (,VITAMIN B-12,) 1000 MCG/ML injection Inject 1,000 mcg into the muscle every 30 (thirty) days.    . fish oil-omega-3 fatty acids 1000 MG capsule Take 1 g by mouth 2 (two) times a week.     Marland Kitchen glycerin adult 2 g suppository Place 1 suppository rectally as needed for constipation.    Marland Kitchen guaiFENesin (MUCINEX) 600 MG 12 hr tablet Take 1,200 mg by mouth 2 (two) times daily.     . hydroxypropyl methylcellulose / hypromellose (ISOPTO TEARS / GONIOVISC) 2.5 % ophthalmic solution Place 1 drop into both eyes daily as needed for dry eyes.    . Magnesium 100 MG TABS Take 100 mg by mouth daily.    . meclizine (ANTIVERT) 25 MG tablet Take 25 mg by mouth daily as needed for dizziness.    . Misc Natural Products (NEURIVA PO) Take 1 capsule by mouth daily.    . Multiple Vitamin (MULTIVITAMIN WITH MINERALS) TABS tablet Take 1 tablet by mouth every morning.    Marland Kitchen PARoxetine (PAXIL) 40 MG tablet Take 1 tablet (40 mg total) by mouth daily. 30 tablet 11  . psyllium (HYDROCIL/METAMUCIL) 95 % PACK Take 1 packet by mouth daily.     Marland Kitchen senna-docusate (SENOKOT-S) 8.6-50 MG tablet Take 1 tablet by mouth at bedtime. 30 tablet 0  . traZODone (DESYREL) 50 MG tablet Take 50 mg by mouth at bedtime.     . vitamin E 100 UNIT capsule Take 100 Units by  mouth every other day.     No current facility-administered medications on file prior to visit.    Allergies:   Allergies  Allergen Reactions  . Other Other (See Comments)    Bee stings - unknown    Physical Exam General: Frail elderly Caucasian male seated, in no evident distress Head: head normocephalic and atraumatic.   Neck: supple with no carotid or supraclavicular bruits Cardiovascular: regular rate and rhythm, no murmurs Musculoskeletal: Kyphoscoliosis. Skin:  no rash/petichiae Vascular:  Normal pulses all extremities  Neurologic Exam Mental Status: Awake and fully alert. Oriented to place and time. Recent and remote memory intact. Attention span, concentration and fund of knowledge appropriate. Mood and affect appropriate.  Cranial Nerves: Fundoscopic exam reveals sharp disc margins. Pupils equal, briskly reactive to light. Extraocular movements full without nystagmus. Visual fields full to confrontation. Hearing intact. Facial sensation intact. Face, tongue, palate moves normally and symmetrically.  Motor: Normal bulk and tone. Normal strength in all tested extremity muscles but effort is not consistent and full.. Sensory.: intact to touch , pinprick , position and vibratory sensation.  Coordination: Rapid alternating movements normal in all extremities. Finger-to-nose and heel-to-shin performed accurately bilaterally. Gait and Station: Gait deferred as patient is a fall risk and is sitting in a wheelchair Reflexes: 1+ and symmetric. Toes downgoing.      ASSESSMENT: 87 year old patient with episode of unwitnessed loss of consciousness with some jerking possibly convulsive syncope rather than seizure.  Given nonfocal neurological exam and unremarkable brain imaging and EEG will hold off on anticonvulsants for now particularly given documentation of significant orthostatic hypotension which could have contributed..  Remote history of possible seizures related to alcohol in the  past as well as a history of syncopal episodes.     PLAN: I had a long discussion with the patient and his wife regarding his  recent episode of brief loss of consciousness possibly convulsive syncopal event rather than a seizure.  I recommend further evaluation with checking CT angiogram of the brain and neck to rule out significant vascular stenosis as well as repeat EEG.  Follow-up with Dr. Caryl Comes to consider cardiac monitoring or work-up.  I have advised the patient not to drive for the next 6 months as per [facility].  He will return for follow-up in the future in 2 months or call earlier if necessary.  Greater than 50% time during this 45-minute consultation visit was spent on counseling and coordination of care about convulsive syncope versus seizure and answering questions. Antony Contras, MD Note: This document was prepared with digital dictation and possible smart phrase technology. Any transcriptional errors that result from this process are unintentional.

## 2020-11-18 ENCOUNTER — Telehealth: Payer: Medicare Other | Admitting: Internal Medicine

## 2020-11-18 ENCOUNTER — Other Ambulatory Visit: Payer: Self-pay

## 2020-11-23 DIAGNOSIS — C61 Malignant neoplasm of prostate: Secondary | ICD-10-CM | POA: Diagnosis not present

## 2020-11-23 DIAGNOSIS — C7951 Secondary malignant neoplasm of bone: Secondary | ICD-10-CM | POA: Diagnosis not present

## 2020-11-27 DIAGNOSIS — J4520 Mild intermittent asthma, uncomplicated: Secondary | ICD-10-CM | POA: Diagnosis not present

## 2020-11-27 DIAGNOSIS — Z4789 Encounter for other orthopedic aftercare: Secondary | ICD-10-CM | POA: Diagnosis not present

## 2020-11-27 DIAGNOSIS — G8929 Other chronic pain: Secondary | ICD-10-CM | POA: Diagnosis not present

## 2020-11-27 DIAGNOSIS — I10 Essential (primary) hypertension: Secondary | ICD-10-CM | POA: Diagnosis not present

## 2020-11-27 DIAGNOSIS — D869 Sarcoidosis, unspecified: Secondary | ICD-10-CM | POA: Diagnosis not present

## 2020-11-27 DIAGNOSIS — M48062 Spinal stenosis, lumbar region with neurogenic claudication: Secondary | ICD-10-CM | POA: Diagnosis not present

## 2020-11-27 DIAGNOSIS — F419 Anxiety disorder, unspecified: Secondary | ICD-10-CM | POA: Diagnosis not present

## 2020-11-27 DIAGNOSIS — K219 Gastro-esophageal reflux disease without esophagitis: Secondary | ICD-10-CM | POA: Diagnosis not present

## 2020-11-27 DIAGNOSIS — E785 Hyperlipidemia, unspecified: Secondary | ICD-10-CM | POA: Diagnosis not present

## 2020-11-27 DIAGNOSIS — K589 Irritable bowel syndrome without diarrhea: Secondary | ICD-10-CM | POA: Diagnosis not present

## 2020-11-27 DIAGNOSIS — J849 Interstitial pulmonary disease, unspecified: Secondary | ICD-10-CM | POA: Diagnosis not present

## 2020-11-27 DIAGNOSIS — K5790 Diverticulosis of intestine, part unspecified, without perforation or abscess without bleeding: Secondary | ICD-10-CM | POA: Diagnosis not present

## 2020-12-02 ENCOUNTER — Ambulatory Visit
Admission: RE | Admit: 2020-12-02 | Discharge: 2020-12-02 | Disposition: A | Discharge status: Home / Self Care | Payer: Medicare Other | Source: Ambulatory Visit | Attending: Neurology | Admitting: Neurology

## 2020-12-02 DIAGNOSIS — I6501 Occlusion and stenosis of right vertebral artery: Secondary | ICD-10-CM | POA: Diagnosis not present

## 2020-12-02 DIAGNOSIS — R55 Syncope and collapse: Secondary | ICD-10-CM | POA: Diagnosis not present

## 2020-12-02 MED ORDER — IOPAMIDOL (ISOVUE-370) INJECTION 76%
75.0000 mL | Freq: Once | INTRAVENOUS | Status: AC | PRN
  Administered 2020-12-02: 60 mL via INTRAVENOUS

## 2020-12-08 DIAGNOSIS — C61 Malignant neoplasm of prostate: Secondary | ICD-10-CM | POA: Diagnosis not present

## 2020-12-10 DIAGNOSIS — I082 Rheumatic disorders of both aortic and tricuspid valves: Secondary | ICD-10-CM | POA: Diagnosis not present

## 2020-12-10 DIAGNOSIS — J42 Unspecified chronic bronchitis: Secondary | ICD-10-CM | POA: Diagnosis not present

## 2020-12-10 DIAGNOSIS — I1310 Hypertensive heart and chronic kidney disease without heart failure, with stage 1 through stage 4 chronic kidney disease, or unspecified chronic kidney disease: Secondary | ICD-10-CM | POA: Diagnosis not present

## 2020-12-10 DIAGNOSIS — K219 Gastro-esophageal reflux disease without esophagitis: Secondary | ICD-10-CM | POA: Diagnosis not present

## 2020-12-10 DIAGNOSIS — I441 Atrioventricular block, second degree: Secondary | ICD-10-CM | POA: Diagnosis not present

## 2020-12-10 DIAGNOSIS — G40909 Epilepsy, unspecified, not intractable, without status epilepticus: Secondary | ICD-10-CM | POA: Diagnosis not present

## 2020-12-10 DIAGNOSIS — C7952 Secondary malignant neoplasm of bone marrow: Secondary | ICD-10-CM | POA: Diagnosis not present

## 2020-12-10 DIAGNOSIS — N1832 Chronic kidney disease, stage 3b: Secondary | ICD-10-CM | POA: Diagnosis not present

## 2020-12-10 DIAGNOSIS — N179 Acute kidney failure, unspecified: Secondary | ICD-10-CM | POA: Diagnosis not present

## 2020-12-10 DIAGNOSIS — C61 Malignant neoplasm of prostate: Secondary | ICD-10-CM | POA: Diagnosis not present

## 2020-12-10 DIAGNOSIS — I951 Orthostatic hypotension: Secondary | ICD-10-CM | POA: Diagnosis not present

## 2020-12-10 DIAGNOSIS — E785 Hyperlipidemia, unspecified: Secondary | ICD-10-CM | POA: Diagnosis not present

## 2020-12-14 ENCOUNTER — Ambulatory Visit (INDEPENDENT_AMBULATORY_CARE_PROVIDER_SITE_OTHER): Payer: Medicare Other | Admitting: Neurology

## 2020-12-14 ENCOUNTER — Other Ambulatory Visit: Payer: Self-pay

## 2020-12-14 DIAGNOSIS — R55 Syncope and collapse: Secondary | ICD-10-CM | POA: Diagnosis not present

## 2020-12-16 DIAGNOSIS — N135 Crossing vessel and stricture of ureter without hydronephrosis: Secondary | ICD-10-CM | POA: Diagnosis not present

## 2020-12-16 DIAGNOSIS — C7951 Secondary malignant neoplasm of bone: Secondary | ICD-10-CM | POA: Diagnosis not present

## 2020-12-16 DIAGNOSIS — C61 Malignant neoplasm of prostate: Secondary | ICD-10-CM | POA: Diagnosis not present

## 2020-12-16 DIAGNOSIS — N3941 Urge incontinence: Secondary | ICD-10-CM | POA: Diagnosis not present

## 2020-12-17 ENCOUNTER — Other Ambulatory Visit: Payer: Self-pay | Admitting: Neurology

## 2020-12-17 DIAGNOSIS — I6501 Occlusion and stenosis of right vertebral artery: Secondary | ICD-10-CM

## 2020-12-17 NOTE — Progress Notes (Signed)
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  This could possibly have contributed to his syncopal event.  I recommend further evaluation with checking diagnostic catheter angiogram.  She will discuss this with her mom and dad and see if they want to move ahead with this.  I ordered the angiogram in epic.

## 2020-12-17 NOTE — Progress Notes (Signed)
Kindly inform the patient that EEG study was normal

## 2020-12-19 ENCOUNTER — Encounter: Payer: Self-pay | Admitting: Cardiology

## 2020-12-20 ENCOUNTER — Telehealth: Payer: Self-pay | Admitting: Neurology

## 2020-12-20 ENCOUNTER — Encounter: Payer: Self-pay | Admitting: Neurology

## 2020-12-20 NOTE — Telephone Encounter (Signed)
Called the pt and there was no answer. Left a detailed message advising the EEG study was normal. Instructed them to call back with any questions and advised a mychart message will also be sent.   If the pt or wife returns call, please advise the EEG for the patient was reviewed by Dr Leonie Man and was normal. He didn't see any seizure like activity.

## 2020-12-20 NOTE — Telephone Encounter (Signed)
-----   Message from Garvin Fila, MD sent at 12/17/2020  9:41 AM EDT ----- Steven Cain inform the patient that EEG study was normal

## 2020-12-22 ENCOUNTER — Other Ambulatory Visit (HOSPITAL_COMMUNITY): Payer: Self-pay | Admitting: Neuroradiology

## 2020-12-22 DIAGNOSIS — I6501 Occlusion and stenosis of right vertebral artery: Secondary | ICD-10-CM

## 2020-12-24 DIAGNOSIS — C7951 Secondary malignant neoplasm of bone: Secondary | ICD-10-CM | POA: Diagnosis not present

## 2020-12-24 DIAGNOSIS — C61 Malignant neoplasm of prostate: Secondary | ICD-10-CM | POA: Diagnosis not present

## 2020-12-28 ENCOUNTER — Other Ambulatory Visit: Payer: Self-pay

## 2020-12-28 ENCOUNTER — Ambulatory Visit (HOSPITAL_COMMUNITY)
Admission: RE | Admit: 2020-12-28 | Discharge: 2020-12-28 | Disposition: A | Discharge status: Home / Self Care | Payer: Medicare Other | Source: Ambulatory Visit | Attending: Neuroradiology | Admitting: Neuroradiology

## 2020-12-28 DIAGNOSIS — I6501 Occlusion and stenosis of right vertebral artery: Secondary | ICD-10-CM

## 2020-12-28 DIAGNOSIS — I6503 Occlusion and stenosis of bilateral vertebral arteries: Secondary | ICD-10-CM | POA: Diagnosis not present

## 2020-12-28 NOTE — Consult Note (Signed)
Chief Complaint: Patient was seen in consultation today for bilateral vertebral artery stenosis.  Referring Physician(s): Caroline More, MD  Supervising Physician: Pedro Earls  Patient Status: [facility] - Out-pt  History of Present Illness: Steven Cain is a 87 y.o. male with a past medical history of prostate cancer with bone mets currently receiving testosterone antagonist treatment, grade 1 diastolic dysfunction, hypertension, CKD 3,  CVA,and remote history of 2 seizure episodes. He was admitted to [facility] on 10/05/2020 due to a syncopal episode thought to be related to orthostatic hypotension.  Workup revealed stenosis of the intracranial right vertebral artery and possible high-grade stenosis at the origin of the bilateral vertebral arteries.  He refers no new symptom since prior admission. He has a baseline unsteady gait.   Past Medical History:  Diagnosis Date  . Abnormal EKG    hx of left anterior fasicular block on ekg, no cardiologist  . Anxiety   . AR (aortic regurgitation) 03/23/2017   Mild, Noted on ECHO  . Arthritis    DJD  . Benign enlargement of prostate    frequent UTI, trim of prostate done , with some issues of incontinence now.  . Chronic bronchitis ([place])    chronic- usually has phelgm often  . COVID-19 08/2019   fatigue and loss of taste and smell x 10 days did monoclonal antibody therapy  . Depression   . Dyspnea    with excertion  . Fluid retention in legs    resolved  . GERD (gastroesophageal reflux disease)    rare  . GI bleed   . Grade I diastolic dysfunction   . Hard of hearing    hear more on left side- no hearing aid  . History of colon polyps   . History of transfusion 2016  . Hydronephrosis 2018   Left ckd stage 3 no nephrologist per wife [place]  . Hypertension   . Moderate concentric left ventricular hypertrophy 03/23/2017   Noted on ECHO  . Perforated, severe stomach ulcer ([place]) 1974  . Peripheral neuropathy     feet and toes  . Spinal stenosis   . TR (tricuspid regurgitation) 03/23/2017   Mild, Noted on ECHO  . Transfusion history    none recent  . Ureteral stricture    CHRONIC  . Urinary incontinence   . UTI (urinary tract infection)    frequent  . Wears glasses     Past Surgical History:  Procedure Laterality Date  . ABDOMINAL SURGERY     bilroths tube '74  . COLONOSCOPY N/A 09/29/2014   Procedure: COLONOSCOPY;  Surgeon: Beryle Beams, MD;  Location: [facility];  Service: Endoscopy;  Laterality: N/A;  . CYSTOSCOPY W/ URETERAL STENT PLACEMENT Left 05/06/2014   Procedure: CYSTOSCOPY WITH RETROGRADE PYELOGRAM/URETERAL STENT PLACEMENT;  Surgeon: [name], MD;  Location: [facility];  Service: Urology;  Laterality: Left;  . CYSTOSCOPY W/ URETERAL STENT PLACEMENT Left 03/31/2015   Procedure: CYSTOSCOPY WITH LEFT RETROGRADE PYELOGRAM/URETERAL STENT EXCHANGE;  Surgeon: [name], MD;  Location: [facility];  Service: Urology;  Laterality: Left;  . CYSTOSCOPY W/ URETERAL STENT PLACEMENT Left 09/15/2015   Procedure: CYSTOSCOPY WITH LEFT RETROGRADE PYELOGRAM/URETERAL STENT EXCHANGE ;  Surgeon: [name], MD;  Location: [facility];  Service: Urology;  Laterality: Left;  . CYSTOSCOPY W/ URETERAL STENT PLACEMENT Left 04/28/2016   Procedure: CYSTOSCOPY WITH RETROGRADE PYELOGRAM/URETERAL STENT EXCHANGE;  Surgeon: [name], MD;  Location: [facility];  Service: Urology;  Laterality: Left;  . CYSTOSCOPY W/  URETERAL STENT PLACEMENT Left 11/08/2016   Procedure: CYSTOSCOPY WITH RETROGRADE PYELOGRAM/URETERAL STENT EXCHANGE;  Surgeon: [name], MD;  Location: [facility];  Service: Urology;  Laterality: Left;  . CYSTOSCOPY W/ URETERAL STENT PLACEMENT Left 09/14/2017   Procedure: CYSTOSCOPY WITH RETROGRADE PYELOGRAM/URETERAL STENT PLACEMENT;  Surgeon: [name], MD;  Location: [facility];  Service: Urology;  Laterality: Left;  . CYSTOSCOPY W/ URETERAL STENT PLACEMENT Left 04/10/2018   Procedure: CYSTOSCOPY WITH  LEFT RETROGRADE URETERAL STENT EXCHANGE;  Surgeon: [name], MD;  Location: [facility];  Service: Urology;  Laterality: Left;  . CYSTOSCOPY W/ URETERAL STENT PLACEMENT Left 01/31/2019   Procedure: CYSTOSCOPY WITH RETROGRADE PYELOGRAM/URETERAL STENT PLACEMENT;  Surgeon: [name], MD;  Location: [facility];  Service: Urology;  Laterality: Left;  45 MINS  . CYSTOSCOPY W/ URETERAL STENT PLACEMENT Left 01/16/2020   Procedure: CYSTOSCOPY WITH RETROGRADE PYELOGRAM/URETERAL STENT EXCHANGED;  Surgeon: [name], MD;  Location: [facility];  Service: Urology;  Laterality: Left;  . CYSTOSCOPY WITH RETROGRADE PYELOGRAM, URETEROSCOPY AND STENT PLACEMENT Left 10/21/2014   Procedure: CYSTOSCOPY WITH RETROGRADE PYELOGRAM, URETEROSCOPY,BIOPSY AND STENT CHANGE;  Surgeon: [name], MD;  Location: [facility];  Service: Urology;  Laterality: Left;  . CYSTOSCOPY/RETROGRADE/URETEROSCOPY Left 06/03/2014   Procedure: CYSTOSCOPY/RETROGRADE/URETEROSCOPY WITH BIOPSY,STENT EXCHANGE;  Surgeon: [name], MD;  Location: [facility];  Service: Urology;  Laterality: Left;  . ESOPHAGOGASTRODUODENOSCOPY N/A 09/28/2014   Procedure: ESOPHAGOGASTRODUODENOSCOPY (EGD);  Surgeon: Beryle Beams, MD;  Location: [facility];  Service: Endoscopy;  Laterality: N/A;  . GASTRIC ROUX-EN-Y     '74  . HERNIA REPAIR     bilateral hernia repairs   . LAPAROSCOPIC APPENDECTOMY N/A 02/15/2020   Procedure: APPENDECTOMY LAPAROSCOPIC;  Surgeon: Jovita Kussmaul, MD;  Location: [facility];  Service: General;  Laterality: N/A;  . TONSILLECTOMY  as child  . TOTAL HIP ARTHROPLASTY Left 05/19/2016   Procedure: LEFT TOTAL HIP ARTHROPLASTY ANTERIOR APPROACH;  Surgeon: Mcarthur Rossetti, MD;  Location: [facility];  Service: Orthopedics;  Laterality: Left;  . TRANSURETHRAL RESECTION OF PROSTATE    . VAGOTOMY  1950's    Allergies: Other  Medications: Prior to Admission medications   Medication Sig Start Date End Date  Taking? Authorizing Provider  acetaminophen (TYLENOL) 500 MG tablet Take 1 tablet (500 mg total) by mouth every 6 (six) hours as needed for mild pain. 02/19/20   [name], PA-C  albuterol (VENTOLIN HFA) 108 (90 Base) MCG/ACT inhaler Inhale 2 puffs into the lungs every 6 (six) hours as needed for wheezing or shortness of breath.    [provider]  Ascorbic Acid (VITAMIN C) 1000 MG tablet Take 2,000 mg by mouth daily.     [provider]  aspirin EC 81 MG tablet Take 81 mg by mouth daily.    [provider]  Cholecalciferol (VITAMIN D) 50 MCG (2000 UT) CAPS Take 4,000 Units by mouth daily. Patient not taking: Reported on 11/17/2020    [provider]  cyanocobalamin (,VITAMIN B-12,) 1000 MCG/ML injection Inject 1,000 mcg into the muscle every 30 (thirty) days.    [provider]  fish oil-omega-3 fatty acids 1000 MG capsule Take 1 g by mouth 2 (two) times a week.     [provider]  glycerin adult 2 g suppository Place 1 suppository rectally as needed for constipation. 02/19/20   [name], PA-C  guaiFENesin (MUCINEX) 600 MG 12 hr tablet Take 1,200 mg by mouth 2 (two) times daily.  [provider]  hydroxypropyl methylcellulose / hypromellose (ISOPTO TEARS / GONIOVISC) 2.5 % ophthalmic solution Place 1 drop into both eyes daily as needed for dry eyes.    [provider]  Magnesium 100 MG TABS Take 100 mg by mouth daily.    [provider]  meclizine (ANTIVERT) 25 MG tablet Take 25 mg by mouth daily as needed for dizziness.    [provider]  Misc Natural Products (NEURIVA PO) Take 1 capsule by mouth daily.    [provider]  Multiple Vitamin (MULTIVITAMIN WITH MINERALS) TABS tablet Take 1 tablet by mouth every morning.    [provider]  PARoxetine (PAXIL) 40 MG tablet Take 1 tablet (40 mg total) by mouth daily. 06/13/18   Danford, Suann Larry, MD  psyllium  (HYDROCIL/METAMUCIL) 95 % PACK Take 1 packet by mouth daily.     [provider]  senna-docusate (SENOKOT-S) 8.6-50 MG tablet Take 1 tablet by mouth at bedtime. 06/13/18   Danford, Suann Larry, MD  traZODone (DESYREL) 50 MG tablet Take 50 mg by mouth at bedtime.  01/19/20   [provider]  vitamin E 100 UNIT capsule Take 100 Units by mouth every other day.    [provider]     Family History  Problem Relation Age of Onset  . Heart disease Neg Hx     Social History   Socioeconomic History  . Marital status: Married    Spouse name: Not on file  . Number of children: Not on file  . Years of education: Not on file  . Highest education level: Not on file  Occupational History  . Not on file  Tobacco Use  . Smoking status: Former Smoker    Types: Cigarettes  . Smokeless tobacco: Former Systems developer    Types: Chew  . Tobacco comment: quit yrs ago  Vaping Use  . Vaping Use: Never used  Substance and Sexual Activity  . Alcohol use: Not Currently  . Drug use: No  . Sexual activity: Not Currently  Other Topics Concern  . Not on file  Social History Narrative  . Not on file   Social Determinants of Health   Financial Resource Strain: Not on file  Food Insecurity: Not on file  Transportation Needs: Not on file  Physical Activity: Not on file  Stress: Not on file  Social Connections: Not on file     Review of Systems: A 12 point ROS discussed and pertinent positives are indicated in the HPI above.  All other systems are negative.  Review of Systems  Vital Signs: There were no vitals taken for this visit.  Physical Exam Constitutional:      General: He is not in acute distress. HENT:     Head: Normocephalic and atraumatic.     Mouth/Throat:     Mouth: Mucous membranes are moist.     Pharynx: No oropharyngeal exudate.  Eyes:     Extraocular Movements: Extraocular movements intact.     Conjunctiva/sclera: Conjunctivae normal.     Pupils: Pupils  are equal, round, and reactive to light.  Neurological:     Mental Status: He is alert and oriented to person, place, and time.     Cranial Nerves: No cranial nerve deficit, dysarthria or facial asymmetry.     Sensory: Sensation is intact.     Motor: Motor function is intact.     Coordination: Finger-Nose-Finger Test normal.  Psychiatric:        Mood and  Affect: Mood normal.        Behavior: Behavior normal.        Thought Content: Thought content normal.        Judgment: Judgment normal.          Imaging: CT ANGIO HEAD W OR WO CONTRAST  Result Date: 12/03/2020 CLINICAL DATA:  Provided history: Convulsive syncope. Stroke/TIA, assess intracranial arteries. Additional history provided by scanning technologist: Patient reports unsteady gait for years, seizure-like activity February 2022, syncopal episode. EXAM: CT ANGIOGRAPHY HEAD AND NECK TECHNIQUE: Multidetector CT imaging of the head and neck was performed using the standard protocol during bolus administration of intravenous contrast. Multiplanar CT image reconstructions and MIPs were obtained to evaluate the vascular anatomy. Carotid stenosis measurements (when applicable) are obtained utilizing NASCET criteria, using the distal internal carotid diameter as the denominator. CONTRAST:  57mL ISOVUE-370 IOPAMIDOL (ISOVUE-370) INJECTION 76% COMPARISON:  Brain MRI 10/05/2020. chest radiographs 10/05/2020. FINDINGS: CT HEAD FINDINGS Brain: Mild cerebral atrophy. Minimal patchy and ill-defined hypoattenuation within the cerebral white matter is nonspecific, but compatible with chronic small vessel ischemic disease Redemonstrated small chronic infarct within the inferior right cerebellar hemisphere. There is no acute intracranial hemorrhage. No demarcated cortical infarct. No extra-axial fluid collection. No evidence of intracranial mass. No midline shift. Vascular: No hyperdense vessel.  Atherosclerotic calcifications. Skull: Normal. Negative for  fracture or focal lesion. Sinuses: No significant paranasal sinus disease. Orbits: No mass or acute finding. Review of the MIP images confirms the above findings CTA NECK FINDINGS Aortic arch: Standard aortic branching. Atherosclerotic plaque within the visualized aortic arch and proximal major branch vessels of the neck. No hemodynamically significant innominate or proximal subclavian artery stenosis. Right carotid system: CCA and ICA patent within the neck without stenosis. Minimal calcified plaque within the carotid bifurcation. Tortuosity of the cervical ICA. Left carotid system: CCA and ICA patent within the neck without stenosis. Mild soft plaque within the CCA. Mild soft and calcified plaque within the carotid bifurcation. Slight tortuosity of the cervical ICA. Vertebral arteries: Codominant and patent within the neck. Soft plaque results in moderate to moderately severe stenosis at the origin of the right vertebral artery. Soft and calcified plaque results in mild stenosis at the origin of the left vertebral artery. Skeleton: Advanced cervical spondylosis. Degenerative appearing fusion across the disc space at C5-C6. Left shoulder osteochondromatosis. Other neck: No neck mass or cervical lymphadenopathy. Thyroid unremarkable. Upper chest: Prominent interstitial markings and foci of reticulation within the imaged lung apices suspicious for fibrotic changes. Review of the MIP images confirms the above findings CTA HEAD FINDINGS Anterior circulation: The intracranial internal carotid arteries are patent. Calcified plaque within both vessels. No more than mild stenosis of the intracranial right ICA. Moderate stenosis of the distal cavernous/paraclinoid left ICA. The M1 middle cerebral arteries are patent. Atherosclerotic irregularity of the M2 and more distal MCA branches bilaterally. However, no M2 proximal branch occlusion or high-grade proximal stenosis is identified. The anterior cerebral arteries are  patent. No intracranial aneurysm is identified. Posterior circulation: The intracranial vertebral arteries are patent. Severe stenosis of the distal V4 right vertebral artery (series 14, image 199) (series 13, image 136). The basilar artery is patent. The right posterior cerebral artery is patent. Severe stenosis within the P2 segment of the right posterior cerebral artery. The left posterior cerebral artery is diminutive and irregular in appearance throughout its course. While this may be partially developmental, there is likely superimposed severe atherosclerotic stenosis. Posterior communicating arteries are present bilaterally. Venous  sinuses: Within the limitations of contrast timing, no convincing thrombus. Anatomic variants: As described Review of the MIP images confirms the above findings IMPRESSION: CT head: 1. No evidence of acute intracranial abnormality. 2. Mild cerebral atrophy and chronic small vessel ischemic disease. 3. Redemonstrated small chronic infarct within the inferior right cerebellar hemisphere. CTA neck: 1. Common carotid and internal carotid arteries patent within the neck without stenosis. Mild atherosclerotic disease within both carotid systems, as described. 2. Vertebral arteries codominant and patent within the neck. Moderate to moderately severe atherosclerotic narrowing at the origin of the right vertebral artery. Mild atherosclerotic narrowing at the origin of the left vertebral artery. 3. Interstitial prominence and reticulation within the imaged lung apices suspicious for fibrotic lung disease. Consider a dedicated chest CT for further evaluation. CTA head: 1. Intracranial atherosclerotic disease with multifocal stenoses, most notably as follows. 2. Severe focal stenosis within the distal V4 right vertebral artery. 3. The left posterior cerebral artery is diminutive and irregular in appearance throughout its course. While this may be partially developmental, there is likely  superimposed severe atherosclerotic disease with multifocal severe stenoses. 4. Calcified plaque results in moderate stenosis of the distal cavernous/paraclinoid left ICA. Electronically Signed   By: Kellie Simmering DO   On: 12/03/2020 08:23   CT ANGIO NECK W OR WO CONTRAST  Result Date: 12/03/2020 CLINICAL DATA:  Provided history: Convulsive syncope. Stroke/TIA, assess intracranial arteries. Additional history provided by scanning technologist: Patient reports unsteady gait for years, seizure-like activity February 2022, syncopal episode. EXAM: CT ANGIOGRAPHY HEAD AND NECK TECHNIQUE: Multidetector CT imaging of the head and neck was performed using the standard protocol during bolus administration of intravenous contrast. Multiplanar CT image reconstructions and MIPs were obtained to evaluate the vascular anatomy. Carotid stenosis measurements (when applicable) are obtained utilizing NASCET criteria, using the distal internal carotid diameter as the denominator. CONTRAST:  61mL ISOVUE-370 IOPAMIDOL (ISOVUE-370) INJECTION 76% COMPARISON:  Brain MRI 10/05/2020. chest radiographs 10/05/2020. FINDINGS: CT HEAD FINDINGS Brain: Mild cerebral atrophy. Minimal patchy and ill-defined hypoattenuation within the cerebral white matter is nonspecific, but compatible with chronic small vessel ischemic disease Redemonstrated small chronic infarct within the inferior right cerebellar hemisphere. There is no acute intracranial hemorrhage. No demarcated cortical infarct. No extra-axial fluid collection. No evidence of intracranial mass. No midline shift. Vascular: No hyperdense vessel.  Atherosclerotic calcifications. Skull: Normal. Negative for fracture or focal lesion. Sinuses: No significant paranasal sinus disease. Orbits: No mass or acute finding. Review of the MIP images confirms the above findings CTA NECK FINDINGS Aortic arch: Standard aortic branching. Atherosclerotic plaque within the visualized aortic arch and proximal  major branch vessels of the neck. No hemodynamically significant innominate or proximal subclavian artery stenosis. Right carotid system: CCA and ICA patent within the neck without stenosis. Minimal calcified plaque within the carotid bifurcation. Tortuosity of the cervical ICA. Left carotid system: CCA and ICA patent within the neck without stenosis. Mild soft plaque within the CCA. Mild soft and calcified plaque within the carotid bifurcation. Slight tortuosity of the cervical ICA. Vertebral arteries: Codominant and patent within the neck. Soft plaque results in moderate to moderately severe stenosis at the origin of the right vertebral artery. Soft and calcified plaque results in mild stenosis at the origin of the left vertebral artery. Skeleton: Advanced cervical spondylosis. Degenerative appearing fusion across the disc space at C5-C6. Left shoulder osteochondromatosis. Other neck: No neck mass or cervical lymphadenopathy. Thyroid unremarkable. Upper chest: Prominent interstitial markings and foci of reticulation within the  imaged lung apices suspicious for fibrotic changes. Review of the MIP images confirms the above findings CTA HEAD FINDINGS Anterior circulation: The intracranial internal carotid arteries are patent. Calcified plaque within both vessels. No more than mild stenosis of the intracranial right ICA. Moderate stenosis of the distal cavernous/paraclinoid left ICA. The M1 middle cerebral arteries are patent. Atherosclerotic irregularity of the M2 and more distal MCA branches bilaterally. However, no M2 proximal branch occlusion or high-grade proximal stenosis is identified. The anterior cerebral arteries are patent. No intracranial aneurysm is identified. Posterior circulation: The intracranial vertebral arteries are patent. Severe stenosis of the distal V4 right vertebral artery (series 14, image 199) (series 13, image 136). The basilar artery is patent. The right posterior cerebral artery is  patent. Severe stenosis within the P2 segment of the right posterior cerebral artery. The left posterior cerebral artery is diminutive and irregular in appearance throughout its course. While this may be partially developmental, there is likely superimposed severe atherosclerotic stenosis. Posterior communicating arteries are present bilaterally. Venous sinuses: Within the limitations of contrast timing, no convincing thrombus. Anatomic variants: As described Review of the MIP images confirms the above findings IMPRESSION: CT head: 1. No evidence of acute intracranial abnormality. 2. Mild cerebral atrophy and chronic small vessel ischemic disease. 3. Redemonstrated small chronic infarct within the inferior right cerebellar hemisphere. CTA neck: 1. Common carotid and internal carotid arteries patent within the neck without stenosis. Mild atherosclerotic disease within both carotid systems, as described. 2. Vertebral arteries codominant and patent within the neck. Moderate to moderately severe atherosclerotic narrowing at the origin of the right vertebral artery. Mild atherosclerotic narrowing at the origin of the left vertebral artery. 3. Interstitial prominence and reticulation within the imaged lung apices suspicious for fibrotic lung disease. Consider a dedicated chest CT for further evaluation. CTA head: 1. Intracranial atherosclerotic disease with multifocal stenoses, most notably as follows. 2. Severe focal stenosis within the distal V4 right vertebral artery. 3. The left posterior cerebral artery is diminutive and irregular in appearance throughout its course. While this may be partially developmental, there is likely superimposed severe atherosclerotic disease with multifocal severe stenoses. 4. Calcified plaque results in moderate stenosis of the distal cavernous/paraclinoid left ICA. Electronically Signed   By: Kellie Simmering DO   On: 12/03/2020 08:23   EEG adult        [facility] [place]. [place] [address] [phone]      Electroencephalogram Procedure Note Mr. COSTA MACKIEWICZ Date of Birth:  December 09, 1930 Medical Record Number:  [id number] Indications: Diagnostic Date of Procedure : 12/14/2020 Medications: none Clinical history : 87 year old patient being evaluated for episode of seizure versus syncope Technical Description This study was performed using 17 channel digital electroencephalographic recording equipment. International 10-20 electrode placement was used. The record was obtained with the patient awake, drowsy and asleep.  The record is of good technical quality for purposes of interpretation. Activation Procedures:   photic stimulation . EEG Description Awake: Alpha Activity: The waking state record contains a well-defined bi-occipital alpha rhythm of  moderate amplitude with a dominant frequency of 10 hz. Reactivity is uncertain. No paroxsymal activity, spikes, or sharp waves are noted. Technical component of study is adequate. EKG tracing shows Mobitz type I heart block Length of this recording is 23 minutes and 24 seconds Sleep: With drowsiness, there is attenuation of the background alpha activity. As the patient enters into light sleep, vertex waves and symmetrical spindles are noted. K complexes are  noted in sleep. Transition to the waking state is unremarkable. Result of Activation Procedures: Hyperventilation: N/A. Photo Stimulation: No photic driving response is noted. Summary Normal electroencephalogram, awake, asleep and with activation procedures. There are no focal lateralizing or epileptiform features.    Labs:  CBC: Recent Labs    10/07/20 0331 10/08/20 0356 10/09/20 0254 10/10/20 0216  WBC 10.8* 8.9 8.2 7.4  HGB 12.2* 12.5* 11.5* 10.6*  HCT 37.7* 36.2* 33.9* 31.5*  PLT 183 146* 182 166    COAGS: Recent Labs    10/05/20 0949  INR 1.0  APTT 30    BMP: Recent Labs    02/16/20 0650 02/17/20 0342 02/18/20 0341 02/19/20 0309  10/05/20 0949 10/07/20 0331 10/08/20 0356 10/09/20 0254 10/10/20 0216  NA 129* 129* 128* 132*   < > 136 135 133* 136  K 4.9 4.6 4.4 4.2   < > 4.5 4.5 5.2* 4.3  CL 98 97* 97* 100   < > 102 102 98 104  CO2 21* 23 21* 23   < > 23 23 27 25   GLUCOSE 135* 111* 116* 103*   < > 100* 99 109* 115*  BUN 27* 24* 20 18   < > 19 16 19 21   CALCIUM 8.5* 8.4* 8.5* 8.7*   < > 9.3 8.9 8.8* 8.7*  CREATININE 2.01* 1.97* 1.90* 1.86*   < > 1.69* 1.53* 1.67* 1.71*  GFRNONAA 29* 30* 31* 32*   < > 39* 44* 39* 38*  GFRAA 34* 34* 36* 37*  --   --   --   --   --    < > = values in this interval not displayed.    LIVER FUNCTION TESTS: Recent Labs    10/07/20 0331 10/08/20 0356 10/09/20 0254 10/10/20 0216  BILITOT 0.6 1.0 0.7 0.7  AST 19 21 17  14*  ALT 12 9 7 9   ALKPHOS 70 63 62 54  PROT 6.4* 6.3* 5.8* 5.4*  ALBUMIN 3.1* 2.9* 2.7* 2.6*    TUMOR MARKERS: No results for input(s): AFPTM, CEA, CA199, CHROMGRNA in the last 8760 hours.  Assessment and Plan:  Pleasant 87 year old male accompanied by his spouse, Mrs. Rivka Spring, with workup positive for intracranial right vertebral artery stenosis as well as stenosis at the origin of the bilateral vertebral arteries on CT angiogram of the head and neck performed on 12/02/2020 as part of a workup for syncopal episode.  I explained to them that the best test to characterize the degree of stenosis and need for intervention is a diagnostic cerebral angiogram.  Procedure was explained to them and questions were answered to their satisfaction. Ours schedulers will reach out to them to book an outpatient diagnostic cerebral angiogram. Recommendations on intervention will be made based on results of the cerebral angiogram.    Thank you for this interesting consult.  I greatly enjoyed meeting OBERT ESPINDOLA and look forward to participating in their care.  A copy of this report was sent to the requesting provider on this date.  Electronically Signed: Pedro Earls, MD 12/28/2020, 2:07 PM   I spent a total of  30 Minutes   in face to face in clinical consultation, greater than 50% of which was counseling/coordinating care for vertebral artery stenosis.

## 2020-12-29 ENCOUNTER — Telehealth (HOSPITAL_COMMUNITY): Payer: Self-pay

## 2020-12-29 NOTE — Telephone Encounter (Signed)
Called to schedule angiogram, no answer, vm full. AW

## 2020-12-30 ENCOUNTER — Ambulatory Visit: Payer: Medicare Other | Admitting: Internal Medicine

## 2021-01-04 ENCOUNTER — Encounter: Payer: Self-pay | Admitting: Internal Medicine

## 2021-01-04 ENCOUNTER — Other Ambulatory Visit: Payer: Self-pay | Admitting: *Deleted

## 2021-01-04 NOTE — Patient Outreach (Signed)
[place] [name]) Care Management  01/04/2021  Steven Cain [birth year] [id number]   Referral Date: 5/11 Referral Source: Insurance Referral Reason: Chronic care management Insurance: [place] attempt #1, unsuccessful, request made to call this care manager back.  Plan: RN CM will send outreach letter and follow up within the next 3-4 business days.  Valente David, [place], MSN [place] [phone]

## 2021-01-07 ENCOUNTER — Other Ambulatory Visit: Payer: Self-pay | Admitting: *Deleted

## 2021-01-07 NOTE — Patient Outreach (Signed)
[place] [facility]) Care Management  01/07/2021  [name] Aug 27, 1930 [id number]   Patient is being transitioned to Chronic Care Management with the primary provider office, case closed.  Valente David, [place], MSN [place] [phone]

## 2021-01-11 DIAGNOSIS — R627 Adult failure to thrive: Secondary | ICD-10-CM | POA: Diagnosis not present

## 2021-01-11 DIAGNOSIS — I951 Orthostatic hypotension: Secondary | ICD-10-CM | POA: Diagnosis not present

## 2021-01-11 DIAGNOSIS — N1832 Chronic kidney disease, stage 3b: Secondary | ICD-10-CM | POA: Diagnosis not present

## 2021-01-11 DIAGNOSIS — R2689 Other abnormalities of gait and mobility: Secondary | ICD-10-CM | POA: Diagnosis not present

## 2021-01-11 DIAGNOSIS — I700 Atherosclerosis of aorta: Secondary | ICD-10-CM | POA: Diagnosis not present

## 2021-01-11 DIAGNOSIS — C7951 Secondary malignant neoplasm of bone: Secondary | ICD-10-CM | POA: Diagnosis not present

## 2021-01-11 DIAGNOSIS — I441 Atrioventricular block, second degree: Secondary | ICD-10-CM | POA: Diagnosis not present

## 2021-01-11 DIAGNOSIS — Z66 Do not resuscitate: Secondary | ICD-10-CM | POA: Diagnosis not present

## 2021-01-11 DIAGNOSIS — N1330 Unspecified hydronephrosis: Secondary | ICD-10-CM | POA: Diagnosis not present

## 2021-01-11 DIAGNOSIS — D692 Other nonthrombocytopenic purpura: Secondary | ICD-10-CM | POA: Diagnosis not present

## 2021-01-11 DIAGNOSIS — C61 Malignant neoplasm of prostate: Secondary | ICD-10-CM | POA: Diagnosis not present

## 2021-01-11 DIAGNOSIS — I129 Hypertensive chronic kidney disease with stage 1 through stage 4 chronic kidney disease, or unspecified chronic kidney disease: Secondary | ICD-10-CM | POA: Diagnosis not present

## 2021-01-19 ENCOUNTER — Other Ambulatory Visit: Payer: Self-pay | Admitting: Student

## 2021-01-19 NOTE — H&P (Signed)
Chief Complaint: Vertebral artery stenosis  Referring Physician(s): Antony Contras, MD  Supervising Physician: Pedro Earls  Patient Status: [facility] - Out-pt  History of Present Illness: Steven Cain is a 87 y.o. male with a past medical history of prostate cancer with bone mets, currently receiving testosterone antagonist treatment, grade 1 diastolic dysfunction, hypertension, CKD 3,  CVA, and remote history of 2 seizure episodes.   He was admitted to [facility] on 10/05/2020 due to a syncopal episode thought to be related to orthostatic hypotension.    Workup revealed stenosis of the intracranial right vertebral artery and possible high-grade stenosis at the origin of the bilateral vertebral arteries.    He denies any new symptoms since prior admission.   He has a baseline unsteady gait.  He was seen in consultation by Dr. Karenann Cai on 12/28/2020.  He is here today for diagnostic cerebral angiography for evaluation of the extent of stenosis and plan possible intervention.  He is NPO. No nausea/vomiting. No Fever/chills. ROS negative.   Past Medical History:  Diagnosis Date   Abnormal EKG    hx of left anterior fasicular block on ekg, no cardiologist   Anxiety    AR (aortic regurgitation) 03/23/2017   Mild, Noted on ECHO   Arthritis    DJD   Benign enlargement of prostate    frequent UTI, trim of prostate done , with some issues of incontinence now.   Chronic bronchitis (HCC)    chronic- usually has phelgm often   COVID-19 08/2019   fatigue and loss of taste and smell x 10 days did monoclonal antibody therapy   Depression    Dyspnea    with excertion   Fluid retention in legs    resolved   GERD (gastroesophageal reflux disease)    rare   GI bleed    Grade I diastolic dysfunction    Hard of hearing    hear more on left side- no hearing aid   History of colon polyps    History of transfusion 2016   Hydronephrosis 2018   Left ckd  stage 3 no nephrologist per wife [place]   Hypertension    Moderate concentric left ventricular hypertrophy 03/23/2017   Noted on ECHO   Perforated, severe stomach ulcer ([address]) 1974   Peripheral neuropathy    feet and toes   Spinal stenosis    TR (tricuspid regurgitation) 03/23/2017   Mild, Noted on ECHO   Transfusion history    none recent   Ureteral stricture    CHRONIC   Urinary incontinence    UTI (urinary tract infection)    frequent   Wears glasses     Past Surgical History:  Procedure Laterality Date   ABDOMINAL SURGERY     bilroths tube '74   COLONOSCOPY N/A 09/29/2014   Procedure: COLONOSCOPY;  Surgeon: Beryle Beams, MD;  Location: [facility];  Service: Endoscopy;  Laterality: N/A;   CYSTOSCOPY W/ URETERAL STENT PLACEMENT Left 05/06/2014   Procedure: CYSTOSCOPY WITH RETROGRADE PYELOGRAM/URETERAL STENT PLACEMENT;  Surgeon: [name], MD;  Location: [facility];  Service: Urology;  Laterality: Left;   CYSTOSCOPY W/ URETERAL STENT PLACEMENT Left 03/31/2015   Procedure: CYSTOSCOPY WITH LEFT RETROGRADE PYELOGRAM/URETERAL STENT EXCHANGE;  Surgeon: [name], MD;  Location: [facility];  Service: Urology;  Laterality: Left;   CYSTOSCOPY W/ URETERAL STENT PLACEMENT Left 09/15/2015   Procedure: CYSTOSCOPY WITH LEFT RETROGRADE PYELOGRAM/URETERAL STENT EXCHANGE ;  Surgeon: [name], MD;  Location: [facility];  Service: Urology;  Laterality: Left;   CYSTOSCOPY W/ URETERAL STENT PLACEMENT Left 04/28/2016   Procedure: CYSTOSCOPY WITH RETROGRADE PYELOGRAM/URETERAL STENT EXCHANGE;  Surgeon: [name], MD;  [facility]: [facility];  Service: Urology;  Laterality: Left;   CYSTOSCOPY W/ URETERAL STENT PLACEMENT Left 11/08/2016   Procedure: CYSTOSCOPY WITH RETROGRADE PYELOGRAM/URETERAL STENT EXCHANGE;  Surgeon: [name], MD;  [facility]: [facility];  Service: Urology;  Laterality: Left;   CYSTOSCOPY W/ URETERAL STENT PLACEMENT Left 09/14/2017   Procedure: CYSTOSCOPY WITH RETROGRADE  PYELOGRAM/URETERAL STENT PLACEMENT;  Surgeon: [name], MD;  [facility]: [facility];  Service: Urology;  Laterality: Left;   CYSTOSCOPY W/ URETERAL STENT PLACEMENT Left 04/10/2018   Procedure: CYSTOSCOPY WITH LEFT RETROGRADE URETERAL STENT EXCHANGE;  Surgeon: [name], MD;  Location: [facility];  Service: Urology;  Laterality: Left;   CYSTOSCOPY W/ URETERAL STENT PLACEMENT Left 01/31/2019   Procedure: CYSTOSCOPY WITH RETROGRADE PYELOGRAM/URETERAL STENT PLACEMENT;  Surgeon: [name], MD;  [facility];  Service: Urology;  Laterality: Left;  [place] PLACEMENT Left 01/16/2020   Procedure: CYSTOSCOPY WITH RETROGRADE PYELOGRAM/URETERAL STENT EXCHANGED;  Surgeon: [name], MD;  Location: [facility];  Service: Urology;  Laterality: Left;   CYSTOSCOPY WITH RETROGRADE PYELOGRAM, URETEROSCOPY AND STENT PLACEMENT Left 10/21/2014   Procedure: CYSTOSCOPY WITH RETROGRADE PYELOGRAM, URETEROSCOPY,BIOPSY AND STENT CHANGE;  Surgeon: [name], MD;  [facility];  Service: Urology;  Laterality: Left;   CYSTOSCOPY/RETROGRADE/URETEROSCOPY Left 06/03/2014   Procedure: CYSTOSCOPY/RETROGRADE/URETEROSCOPY WITH BIOPSY,STENT EXCHANGE;  Surgeon: [name], MD;  Location: [facility];  Service: Urology;  Laterality: Left;   ESOPHAGOGASTRODUODENOSCOPY N/A 09/28/2014   Procedure: ESOPHAGOGASTRODUODENOSCOPY (EGD);  Surgeon: Beryle Beams, MD;  Location: [facility];  Service: Endoscopy;  Laterality: N/A;   GASTRIC ROUX-EN-Y     '74   HERNIA REPAIR     bilateral hernia repairs    LAPAROSCOPIC APPENDECTOMY N/A 02/15/2020   Procedure: APPENDECTOMY LAPAROSCOPIC;  Surgeon: Jovita Kussmaul, MD;  Location: [facility];  Service: General;  Laterality: N/A;   TONSILLECTOMY  as child   TOTAL HIP ARTHROPLASTY Left 05/19/2016   Procedure: LEFT TOTAL HIP ARTHROPLASTY ANTERIOR APPROACH;  Surgeon: Mcarthur Rossetti, MD;  Location: [facility];  Service:  Orthopedics;  Laterality: Left;   TRANSURETHRAL RESECTION OF PROSTATE     VAGOTOMY  1950's    Allergies: Bee venom  Medications: Prior to Admission medications   Medication Sig Start Date End Date Taking? Authorizing Provider  acetaminophen (TYLENOL) 500 MG tablet Take 1 tablet (500 mg total) by mouth every 6 (six) hours as needed for mild pain. Patient taking differently: Take 1,000 mg by mouth every 6 (six) hours as needed for mild pain. 02/19/20  Yes [name], PA-C  albuterol (VENTOLIN HFA) 108 (90 Base) MCG/ACT inhaler Inhale 2 puffs into the lungs every 6 (six) hours as needed for wheezing or shortness of breath.   Yes [provider]  Ascorbic Acid (VITAMIN C) 1000 MG tablet Take 1,000 mg by mouth daily.   Yes [provider]  aspirin EC 81 MG tablet Take 81 mg by mouth daily.   Yes [provider]  calcium carbonate (TUMS - DOSED IN MG ELEMENTAL CALCIUM) 500 MG chewable tablet Chew 1,000 mg by mouth daily as needed for indigestion or heartburn.   Yes [provider]  cyanocobalamin (,VITAMIN B-12,) 1000 MCG/ML injection Inject 1,000 mcg into the muscle every 30 (thirty) days.   Yes  [provider]  Denosumab (XGEVA [place]) Inject 1 Dose into the skin every 28 (twenty-eight) days.   Yes [provider]  glycerin adult 2 g suppository Place 1 suppository rectally as needed for constipation. 02/19/20  Yes [name], PA-C  guaiFENesin (MUCINEX) 600 MG 12 hr tablet Take 1,200 mg by mouth 2 (two) times daily.    Yes [provider]  hydroxypropyl methylcellulose / hypromellose (ISOPTO TEARS / GONIOVISC) 2.5 % ophthalmic solution Place 1 drop into both eyes daily as needed for dry eyes.   Yes [provider]  MAGNESIUM PO Take 1 tablet by mouth daily as needed (shortness of breath).   Yes [provider]  meclizine (ANTIVERT) 25 MG tablet Take 25 mg by mouth See admin instructions. Take 25 mg daily, may  take a second 25 mg dose as needed for dizziness   Yes [provider]  Misc Natural Products (NEURIVA PO) Take 1 capsule by mouth daily.   Yes [provider]  Multiple Vitamin (MULTIVITAMIN WITH MINERALS) TABS tablet Take 1 tablet by mouth every morning.   Yes [provider]  OVER THE COUNTER MEDICATION Apply 1 application topically daily as needed (pain). Hempvana otc pain relieving cream   Yes [provider]  PARoxetine (PAXIL) 40 MG tablet Take 1 tablet (40 mg total) by mouth daily. Patient taking differently: Take 40 mg by mouth at bedtime. 06/13/18  Yes Danford, Suann Larry, MD  Psyllium (METAMUCIL PO) Take 1 Dose by mouth 3 (three) times a week. 1 dose = 3 teaspoonful   Yes [provider]  senna-docusate (SENOKOT-S) 8.6-50 MG tablet Take 1 tablet by mouth at bedtime. Patient taking differently: Take 2 tablets by mouth 2 (two) times daily. 06/13/18  Yes Danford, Suann Larry, MD  traZODone (DESYREL) 50 MG tablet Take 50 mg by mouth at bedtime as needed for sleep. 01/19/20  Yes [provider]  VITAMIN E PO Take 1 capsule by mouth every Wednesday.   Yes [provider]     Family History  Problem Relation Age of Onset   Heart disease Neg Hx     Social History   Socioeconomic History   Marital status: Married    Spouse name: Not on file   Number of children: Not on file   Years of education: Not on file   Highest education level: Not on file  Occupational History   Not on file  Tobacco Use   Smoking status: Former    Pack years: 0.00    Types: Cigarettes   Smokeless tobacco: Former    Types: Chew   Tobacco comments:    quit yrs ago  Vaping Use   Vaping Use: Never used  Substance and Sexual Activity   Alcohol use: Not Currently   Drug use: No   Sexual activity: Not Currently  Other Topics Concern   Not on file  Social History Narrative   Not on file   Social Determinants of Health   Financial  Resource Strain: Not on file  Food Insecurity: Not on file  Transportation Needs: Not on file  Physical Activity: Not on file  Stress: Not on file  Social Connections: Not on file     Review of Systems: A 12 point ROS discussed and pertinent positives are indicated in the HPI above.  All other systems are negative.  Review of Systems  Vital Signs: BP (!) 211/62   Pulse 94   Temp 97.7 F (36.5 C) (  Oral)   Resp 16   Ht 5\' 4"  (1.626 m)   Wt 70 kg   SpO2 96%   BMI 26.49 kg/m   Physical Exam Vitals reviewed.  Constitutional:      Appearance: Normal appearance.  HENT:     Head: Normocephalic and atraumatic.     Ears:     Comments: Hard of hearing Eyes:     Extraocular Movements: Extraocular movements intact.  Cardiovascular:     Rate and Rhythm: Normal rate and regular rhythm.  Pulmonary:     Effort: Pulmonary effort is normal. No respiratory distress.     Breath sounds: Normal breath sounds.  Abdominal:     General: There is no distension.     Palpations: Abdomen is soft.     Tenderness: There is no abdominal tenderness.  Musculoskeletal:        General: Normal range of motion.     Cervical back: Normal range of motion.  Skin:    General: Skin is warm and dry.  Neurological:     General: No focal deficit present.     Mental Status: He is alert and oriented to person, place, and time.  Psychiatric:        Mood and Affect: Mood normal.        Behavior: Behavior normal.        Thought Content: Thought content normal.        Judgment: Judgment normal.    Imaging: No results found.  Labs:  CBC: Recent Labs    10/08/20 0356 10/09/20 0254 10/10/20 0216 01/20/21 0710  WBC 8.9 8.2 7.4 7.9  HGB 12.5* 11.5* 10.6* 13.2  HCT 36.2* 33.9* 31.5* 41.0  PLT 146* 182 166 231    COAGS: Recent Labs    10/05/20 0949 01/20/21 0710  INR 1.0 0.9  APTT 30  --     BMP: Recent Labs    02/16/20 0650 02/17/20 0342 02/18/20 0341 02/19/20 0309 10/05/20 0949  10/07/20 0331 10/08/20 0356 10/09/20 0254 10/10/20 0216  NA 129* 129* 128* 132*   < > 136 135 133* 136  K 4.9 4.6 4.4 4.2   < > 4.5 4.5 5.2* 4.3  CL 98 97* 97* 100   < > 102 102 98 104  CO2 21* 23 21* 23   < > 23 23 27 25   GLUCOSE 135* 111* 116* 103*   < > 100* 99 109* 115*  BUN 27* 24* 20 18   < > 19 16 19 21   CALCIUM 8.5* 8.4* 8.5* 8.7*   < > 9.3 8.9 8.8* 8.7*  CREATININE 2.01* 1.97* 1.90* 1.86*   < > 1.69* 1.53* 1.67* 1.71*  GFRNONAA 29* 30* 31* 32*   < > 39* 44* 39* 38*  GFRAA 34* 34* 36* 37*  --   --   --   --   --    < > = values in this interval not displayed.    LIVER FUNCTION TESTS: Recent Labs    10/07/20 0331 10/08/20 0356 10/09/20 0254 10/10/20 0216  BILITOT 0.6 1.0 0.7 0.7  AST 19 21 17  14*  ALT 12 9 7 9   ALKPHOS 70 63 62 54  PROT 6.4* 6.3* 5.8* 5.4*  ALBUMIN 3.1* 2.9* 2.7* 2.6*    TUMOR MARKERS: No results for input(s): AFPTM, CEA, CA199, CHROMGRNA in the last 8760 hours.  Assessment and Plan:  Intracranial right vertebral artery stenosis as well as stenosis at the origin of the bilateral vertebral  arteries on CT angiogram of the head and neck performed on 12/02/2020.  Will proceed with diagnostic cerebral angiogram today by Dr. Karenann Cai.  Risks and benefits of cerebral angiogram with intervention were discussed with the patient including, but not limited to bleeding, infection, vascular injury, contrast induced renal failure, stroke or even death.  This interventional procedure involves the use of X-rays and because of the nature of the planned procedure, it is possible that we will have prolonged use of X-ray fluoroscopy.  Potential radiation risks to you include (but are not limited to) the following: - A slightly elevated risk for cancer  several years later in life. This risk is typically less than 0.5% percent. This risk is low in comparison to the normal incidence of human cancer, which is 33% for women and 50% for men according to the  [employer]. - Radiation induced injury can include skin redness, resembling a rash, tissue breakdown / ulcers and hair loss (which can be temporary or permanent).   The likelihood of either of these occurring depends on the difficulty of the procedure and whether you are sensitive to radiation due to previous procedures, disease, or genetic conditions.   IF your procedure requires a prolonged use of radiation, you will be notified and given written instructions for further action.  It is your responsibility to monitor the irradiated area for the 2 weeks following the procedure and to notify your physician if you are concerned that you have suffered a radiation induced injury.    All of the patient's questions were answered, patient is agreeable to proceed.  Consent signed and in chart.  Thank you for this interesting consult.  I greatly enjoyed meeting SAMULE LIFE and look forward to participating in their care.  A copy of this report was sent to the requesting provider on this date.  Electronically Signed: Murrell Redden, PA-C   01/20/2021, 8:10 AM      I spent a total of   15 Minutes in face to face in clinical consultation, greater than 50% of which was counseling/coordinating care for diagnostic cerebral angiography.

## 2021-01-20 ENCOUNTER — Other Ambulatory Visit (HOSPITAL_COMMUNITY): Payer: Self-pay | Admitting: Neuroradiology

## 2021-01-20 ENCOUNTER — Ambulatory Visit (HOSPITAL_COMMUNITY)
Admission: RE | Admit: 2021-01-20 | Discharge: 2021-01-20 | Disposition: A | Discharge status: Home / Self Care | Payer: Medicare Other | Source: Ambulatory Visit | Attending: Neurology | Admitting: Neurology

## 2021-01-20 ENCOUNTER — Other Ambulatory Visit: Payer: Self-pay | Admitting: Neurology

## 2021-01-20 ENCOUNTER — Other Ambulatory Visit: Payer: Self-pay

## 2021-01-20 ENCOUNTER — Encounter (HOSPITAL_COMMUNITY): Payer: Self-pay

## 2021-01-20 DIAGNOSIS — I6501 Occlusion and stenosis of right vertebral artery: Secondary | ICD-10-CM | POA: Insufficient documentation

## 2021-01-20 DIAGNOSIS — Z8546 Personal history of malignant neoplasm of prostate: Secondary | ICD-10-CM | POA: Insufficient documentation

## 2021-01-20 DIAGNOSIS — I129 Hypertensive chronic kidney disease with stage 1 through stage 4 chronic kidney disease, or unspecified chronic kidney disease: Secondary | ICD-10-CM | POA: Diagnosis not present

## 2021-01-20 DIAGNOSIS — N183 Chronic kidney disease, stage 3 unspecified: Secondary | ICD-10-CM | POA: Diagnosis not present

## 2021-01-20 DIAGNOSIS — Z7982 Long term (current) use of aspirin: Secondary | ICD-10-CM | POA: Insufficient documentation

## 2021-01-20 DIAGNOSIS — Z8616 Personal history of COVID-19: Secondary | ICD-10-CM | POA: Diagnosis not present

## 2021-01-20 DIAGNOSIS — Z87891 Personal history of nicotine dependence: Secondary | ICD-10-CM | POA: Diagnosis not present

## 2021-01-20 DIAGNOSIS — I6503 Occlusion and stenosis of bilateral vertebral arteries: Secondary | ICD-10-CM | POA: Diagnosis not present

## 2021-01-20 DIAGNOSIS — C7951 Secondary malignant neoplasm of bone: Secondary | ICD-10-CM | POA: Insufficient documentation

## 2021-01-20 DIAGNOSIS — Z91030 Bee allergy status: Secondary | ICD-10-CM | POA: Insufficient documentation

## 2021-01-20 DIAGNOSIS — Z79899 Other long term (current) drug therapy: Secondary | ICD-10-CM | POA: Insufficient documentation

## 2021-01-20 HISTORY — PX: IR ANGIO INTRA EXTRACRAN SEL COM CAROTID INNOMINATE BILAT MOD SED: IMG5360

## 2021-01-20 HISTORY — PX: IR US GUIDE VASC ACCESS RIGHT: IMG2390

## 2021-01-20 HISTORY — PX: IR ANGIO VERTEBRAL SEL SUBCLAVIAN INNOMINATE BILAT MOD SED: IMG5366

## 2021-01-20 LAB — CBC WITH DIFFERENTIAL/PLATELET
Abs Immature Granulocytes: 0.01 K/uL (ref 0.00–0.07)
Basophils Absolute: 0.1 K/uL (ref 0.0–0.1)
Basophils Relative: 1 %
Eosinophils Absolute: 0.6 K/uL — ABNORMAL HIGH (ref 0.0–0.5)
Eosinophils Relative: 8 %
HCT: 41 % (ref 39.0–52.0)
Hemoglobin: 13.2 g/dL (ref 13.0–17.0)
Immature Granulocytes: 0 %
Lymphocytes Relative: 31 %
Lymphs Abs: 2.5 K/uL (ref 0.7–4.0)
MCH: 28.8 pg (ref 26.0–34.0)
MCHC: 32.2 g/dL (ref 30.0–36.0)
MCV: 89.5 fL (ref 80.0–100.0)
Monocytes Absolute: 0.6 K/uL (ref 0.1–1.0)
Monocytes Relative: 8 %
Neutro Abs: 4.1 K/uL (ref 1.7–7.7)
Neutrophils Relative %: 52 %
Platelets: 231 K/uL (ref 150–400)
RBC: 4.58 MIL/uL (ref 4.22–5.81)
RDW: 15.2 % (ref 11.5–15.5)
WBC: 7.9 K/uL (ref 4.0–10.5)
nRBC: 0 % (ref 0.0–0.2)

## 2021-01-20 LAB — BASIC METABOLIC PANEL WITH GFR
Anion gap: 8 (ref 5–15)
BUN: 16 mg/dL (ref 8–23)
CO2: 28 mmol/L (ref 22–32)
Calcium: 8.7 mg/dL — ABNORMAL LOW (ref 8.9–10.3)
Chloride: 100 mmol/L (ref 98–111)
Creatinine, Ser: 1.52 mg/dL — ABNORMAL HIGH (ref 0.61–1.24)
GFR, Estimated: 44 mL/min — ABNORMAL LOW
Glucose, Bld: 107 mg/dL — ABNORMAL HIGH (ref 70–99)
Potassium: 4.2 mmol/L (ref 3.5–5.1)
Sodium: 136 mmol/L (ref 135–145)

## 2021-01-20 LAB — PROTIME-INR
INR: 0.9 (ref 0.8–1.2)
Prothrombin Time: 12.5 seconds (ref 11.4–15.2)

## 2021-01-20 MED ORDER — FENTANYL CITRATE (PF) 100 MCG/2ML IJ SOLN
INTRAMUSCULAR | Status: AC | PRN
  Administered 2021-01-20 (×2): 12.5 ug via INTRAVENOUS

## 2021-01-20 MED ORDER — CLONIDINE HCL 0.2 MG PO TABS
0.2000 mg | ORAL_TABLET | Freq: Once | ORAL | Status: AC
  Administered 2021-01-20: 0.2 mg via ORAL
  Filled 2021-01-20: qty 1

## 2021-01-20 MED ORDER — IOHEXOL 300 MG/ML  SOLN
100.0000 mL | Freq: Once | INTRAMUSCULAR | Status: AC | PRN
  Administered 2021-01-20: 75 mL

## 2021-01-20 MED ORDER — NITROGLYCERIN 1 MG/10 ML FOR IR/CATH LAB
INTRA_ARTERIAL | Status: AC | PRN
  Administered 2021-01-20 (×2): 200 ug via INTRA_ARTERIAL

## 2021-01-20 MED ORDER — MIDAZOLAM HCL 2 MG/2ML IJ SOLN
INTRAMUSCULAR | Status: AC | PRN
  Administered 2021-01-20 (×2): 0.5 mg via INTRAVENOUS
  Administered 2021-01-20 (×2): .25 mg via INTRAVENOUS

## 2021-01-20 MED ORDER — VERAPAMIL HCL 2.5 MG/ML IV SOLN
INTRAVENOUS | Status: AC | PRN
  Administered 2021-01-20: 5 mg via INTRAVENOUS

## 2021-01-20 MED ORDER — ACETAMINOPHEN 325 MG PO TABS
ORAL_TABLET | ORAL | Status: AC
  Filled 2021-01-20: qty 2

## 2021-01-20 MED ORDER — ACETAMINOPHEN 325 MG PO TABS
650.0000 mg | ORAL_TABLET | Freq: Once | ORAL | Status: AC
  Administered 2021-01-20: 650 mg via ORAL

## 2021-01-20 MED ORDER — NITROGLYCERIN 1 MG/10 ML FOR IR/CATH LAB
INTRA_ARTERIAL | Status: AC
  Filled 2021-01-20: qty 10

## 2021-01-20 MED ORDER — LIDOCAINE HCL (PF) 1 % IJ SOLN
INTRAMUSCULAR | Status: AC
  Filled 2021-01-20: qty 30

## 2021-01-20 MED ORDER — SODIUM CHLORIDE 0.9 % IV SOLN: INTRAVENOUS | Status: DC

## 2021-01-20 MED ORDER — HEPARIN SODIUM (PORCINE) 1000 UNIT/ML IJ SOLN
INTRAMUSCULAR | Status: AC | PRN
  Administered 2021-01-20: 5000 [IU] via INTRA_ARTERIAL

## 2021-01-20 MED ORDER — FENTANYL CITRATE (PF) 100 MCG/2ML IJ SOLN
INTRAMUSCULAR | Status: AC
  Filled 2021-01-20: qty 2

## 2021-01-20 MED ORDER — MIDAZOLAM HCL 2 MG/2ML IJ SOLN
INTRAMUSCULAR | Status: AC
  Filled 2021-01-20: qty 2

## 2021-01-20 MED ORDER — VERAPAMIL HCL 2.5 MG/ML IV SOLN
INTRAVENOUS | Status: AC
  Filled 2021-01-20: qty 2

## 2021-01-20 MED ORDER — HEPARIN SODIUM (PORCINE) 1000 UNIT/ML IJ SOLN
INTRAMUSCULAR | Status: AC
  Filled 2021-01-20: qty 1

## 2021-01-20 NOTE — Sedation Documentation (Signed)
Patient transported to short stay. Danae Chen RN at the bedside. Patient moved to another bed. Groin site assessed. Clean, dry, and intact. No drainage noted from dressing. Some puffiness noted to site, however soft to palpation. Patient does complain of some general discomfort when pressed upon. Intact pulses distal +2. Right wrist assessed. TR band in place. Notable ecchymosis however intact pulses.

## 2021-01-20 NOTE — Discharge Instructions (Signed)
Radial Site Care  This sheet gives you information about how to care for yourself after your procedure. Your health care provider may also give you more specific instructions. If you have problems or questions, contact your health care provider. What can I expect after the procedure? After the procedure, it is common to have: Bruising and tenderness at the catheter insertion area. Follow these instructions at home: Medicines Take over-the-counter and prescription medicines only as told by your health care provider. Insertion site care Follow instructions from your health care provider about how to take care of your insertion site. Make sure you: Wash your hands with soap and water before you remove your bandage (dressing). If soap and water are not available, use hand sanitizer. May remove dressing in 24 hours. Check your insertion site every day for signs of infection. Check for: Redness, swelling, or pain. Fluid or blood. Pus or a bad smell. Warmth. Do no take baths, swim, or use a hot tub for 5 days. You may shower 24-48 hours after the procedure. Remove the dressing and gently wash the site with plain soap and water. Pat the area dry with a clean towel. Do not rub the site. That could cause bleeding. Do not apply powder or lotion to the site. Activity  For 24 hours after the procedure, or as directed by your health care provider: Do not flex or bend the affected arm. Do not push or pull heavy objects with the affected arm. Do not drive yourself home from the hospital or clinic. You may drive 24 hours after the procedure. Do not operate machinery or power tools. KEEP ARM ELEVATED THE REMAINDER OF THE DAY. Do not push, pull or lift anything that is heavier than 10 lb for 5 days. Ask your health care provider when it is okay to: Return to work or school. Resume usual physical activities or sports. Resume sexual activity. General instructions If the catheter site starts to  bleed, raise your arm and put firm pressure on the site. If the bleeding does not stop, get help right away. This is a medical emergency. DRINK PLENTY OF FLUIDS FOR THE NEXT 2-3 DAYS. No alcohol consumption for 24 hours after receiving sedation. If you went home on the same day as your procedure, a responsible adult should be with you for the first 24 hours after you arrive home. Keep all follow-up visits as told by your health care provider. This is important. Contact a health care provider if: You have a fever. You have redness, swelling, or yellow drainage around your insertion site. Get help right away if: You have unusual pain at the radial site. The catheter insertion area swells very fast. The insertion area is bleeding, and the bleeding does not stop when you hold steady pressure on the area. Your arm or hand becomes pale, cool, tingly, or numb. These symptoms may represent a serious problem that is an emergency. Do not wait to see if the symptoms will go away. Get medical help right away. Call your local emergency services (911 in the [place] Do not drive yourself to the hospital. Summary After the procedure, it is common to have bruising and tenderness at the site. Follow instructions from your health care provider about how to take care of your radial site wound. Check the wound every day for signs of infection.  This information is not intended to replace advice given to you by your health care provider. Make sure you discuss any questions you have with   your health care provider. Document Revised: 09/05/2017 Document Reviewed: 09/05/2017 Elsevier Patient Education  2020 [employer] Inc[employer]  

## 2021-01-20 NOTE — Sedation Documentation (Signed)
5 Fr Exoseal deployed right groin

## 2021-01-24 ENCOUNTER — Telehealth (HOSPITAL_COMMUNITY): Payer: Self-pay

## 2021-01-24 NOTE — Telephone Encounter (Signed)
Called to schedule consult, no answer, left vm. AW  

## 2021-01-28 DIAGNOSIS — C61 Malignant neoplasm of prostate: Secondary | ICD-10-CM | POA: Diagnosis not present

## 2021-01-28 DIAGNOSIS — C7951 Secondary malignant neoplasm of bone: Secondary | ICD-10-CM | POA: Diagnosis not present

## 2021-01-29 NOTE — Progress Notes (Signed)
Kindly inform the patient that cerebral catheter angiogram study shows moderate narrowing at the origin of the left vertebral artery and mild narrowing at the origin of the right vertebral artery.  Continue medical management at present and will discuss further at next office follow-up visit.  No need for any emergent revascularization

## 2021-02-01 ENCOUNTER — Telehealth: Payer: Self-pay | Admitting: Emergency Medicine

## 2021-02-01 NOTE — Telephone Encounter (Signed)
Called and spoke to patient's daughter, Levada Dy, regarding Dr. Clydene Fake review and findings regarding the  Cerebral Angiogram Study.  Confirmed patient's appointment on 02/08/21.  Patient denied further questions, verbalized understanding and expressed appreciation for the phone call.

## 2021-02-04 ENCOUNTER — Ambulatory Visit (HOSPITAL_COMMUNITY)
Admission: RE | Admit: 2021-02-04 | Discharge: 2021-02-04 | Disposition: A | Discharge status: Home / Self Care | Payer: Medicare Other | Source: Ambulatory Visit | Attending: Neuroradiology | Admitting: Neuroradiology

## 2021-02-04 ENCOUNTER — Other Ambulatory Visit: Payer: Self-pay

## 2021-02-04 DIAGNOSIS — I6501 Occlusion and stenosis of right vertebral artery: Secondary | ICD-10-CM

## 2021-02-04 DIAGNOSIS — I672 Cerebral atherosclerosis: Secondary | ICD-10-CM | POA: Diagnosis not present

## 2021-02-04 DIAGNOSIS — I6503 Occlusion and stenosis of bilateral vertebral arteries: Secondary | ICD-10-CM | POA: Diagnosis not present

## 2021-02-04 DIAGNOSIS — I6622 Occlusion and stenosis of left posterior cerebral artery: Secondary | ICD-10-CM | POA: Diagnosis not present

## 2021-02-04 NOTE — Progress Notes (Signed)
Referring Physician(s): de Macedo Lyncourt  Chief Complaint: The patient is seen in follow up today s/p diagnostic cerebral angiogram.  History of present illness:  Steven Cain is a 87 y.o. male with a past medical history of prostate cancer with bone mets currently receiving testosterone antagonist treatment, grade 1 diastolic dysfunction, hypertension, CKD 3,  CVA,and remote history of 2 seizure episodes. He was admitted to [facility] on 10/05/2020 due to a syncopal episode thought to be related to orthostatic hypotension.  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   Past Medical History:  Diagnosis Date   Abnormal EKG    hx of left anterior fasicular block on ekg, no cardiologist   Anxiety    AR (aortic regurgitation) 03/23/2017   Mild, Noted on ECHO   Arthritis    DJD   Benign enlargement of prostate    frequent UTI, trim of prostate done , with some issues of incontinence now.   Chronic bronchitis (HCC)    chronic- usually has phelgm often   COVID-19 08/2019   fatigue and loss of taste and smell x 10 days did monoclonal antibody therapy   Depression    Dyspnea    with excertion   Fluid retention in legs    resolved   GERD (gastroesophageal reflux disease)    rare   GI bleed    Grade I diastolic dysfunction    Hard of hearing    hear more on left side- no hearing aid   History of colon polyps    History of transfusion 2016   Hydronephrosis 2018   Left ckd stage 3 no nephrologist per wife adele   Hypertension    Moderate concentric left ventricular hypertrophy 03/23/2017   Noted on ECHO   Perforated, severe stomach ulcer ([place]) 1974   Peripheral neuropathy    feet and toes   Spinal stenosis    TR (tricuspid  regurgitation) 03/23/2017   Mild, Noted on ECHO   Transfusion history    none recent   Ureteral stricture    CHRONIC   Urinary incontinence    UTI (urinary tract infection)    frequent   Wears glasses     Past Surgical History:  Procedure Laterality Date   ABDOMINAL SURGERY     bilroths tube '74   COLONOSCOPY N/A 09/29/2014   Procedure: COLONOSCOPY;  Surgeon: Beryle Beams, MD;  Location: [facility];  Service: Endoscopy;  Laterality: N/A;   CYSTOSCOPY W/ URETERAL STENT PLACEMENT Left 05/06/2014   Procedure: CYSTOSCOPY WITH RETROGRADE PYELOGRAM/URETERAL STENT PLACEMENT;  Surgeon: [name], MD;  Location: [facility];  Service: Urology;  Laterality: Left;   CYSTOSCOPY W/ URETERAL STENT PLACEMENT Left 03/31/2015   Procedure: CYSTOSCOPY WITH LEFT RETROGRADE PYELOGRAM/URETERAL STENT EXCHANGE;  Surgeon: [name], MD;  Location: [facility];  Service: Urology;  Laterality: Left;   CYSTOSCOPY W/ URETERAL STENT PLACEMENT Left 09/15/2015   Procedure: CYSTOSCOPY WITH LEFT RETROGRADE PYELOGRAM/URETERAL STENT EXCHANGE ;  Surgeon: [name], MD;  Location: [facility];  Service: Urology;  Laterality: Left;   CYSTOSCOPY W/ URETERAL STENT PLACEMENT Left 04/28/2016   Procedure: CYSTOSCOPY WITH RETROGRADE PYELOGRAM/URETERAL STENT EXCHANGE;  Surgeon: [name], MD;  Location: [facility];  Service: Urology;  Laterality: Left;   CYSTOSCOPY W/ URETERAL STENT PLACEMENT Left 11/08/2016   Procedure: CYSTOSCOPY WITH  RETROGRADE PYELOGRAM/URETERAL STENT EXCHANGE;  Surgeon: [name], MD;  Location: [facility];  Service: Urology;  Laterality: Left;   CYSTOSCOPY W/ URETERAL STENT PLACEMENT Left 09/14/2017   Procedure: CYSTOSCOPY WITH RETROGRADE PYELOGRAM/URETERAL STENT PLACEMENT;  Surgeon: [name], MD;  Location: [facility];  Service: Urology;  Laterality: Left;   CYSTOSCOPY W/ URETERAL STENT PLACEMENT Left 04/10/2018   Procedure: CYSTOSCOPY WITH LEFT RETROGRADE URETERAL STENT EXCHANGE;  Surgeon: [name],  MD;  Location: [facility];  Service: Urology;  Laterality: Left;   CYSTOSCOPY W/ URETERAL STENT PLACEMENT Left 01/31/2019   Procedure: CYSTOSCOPY WITH RETROGRADE PYELOGRAM/URETERAL STENT PLACEMENT;  Surgeon: [name], MD;  Location: [facility];  Service: Urology;  Laterality: Left;  [place] PLACEMENT Left 01/16/2020   Procedure: CYSTOSCOPY WITH RETROGRADE PYELOGRAM/URETERAL STENT EXCHANGED;  Surgeon: [name], MD;  Location: [facility];  Service: Urology;  Laterality: Left;   CYSTOSCOPY WITH RETROGRADE PYELOGRAM, URETEROSCOPY AND STENT PLACEMENT Left 10/21/2014   Procedure: CYSTOSCOPY WITH RETROGRADE PYELOGRAM, URETEROSCOPY,BIOPSY AND STENT CHANGE;  Surgeon: [name], MD;  Location: [facility];  Service: Urology;  Laterality: Left;   CYSTOSCOPY/RETROGRADE/URETEROSCOPY Left 06/03/2014   Procedure: CYSTOSCOPY/RETROGRADE/URETEROSCOPY WITH BIOPSY,STENT EXCHANGE;  Surgeon: [name], MD;  Location: [facility];  Service: Urology;  Laterality: Left;   ESOPHAGOGASTRODUODENOSCOPY N/A 09/28/2014   Procedure: ESOPHAGOGASTRODUODENOSCOPY (EGD);  Surgeon: Beryle Beams, MD;  Location: [facility];  Service: Endoscopy;  Laterality: N/A;   GASTRIC ROUX-EN-Y     '74   HERNIA REPAIR     bilateral hernia repairs    IR ANGIO INTRA EXTRACRAN SEL COM CAROTID INNOMINATE BILAT MOD SED  01/20/2021   IR ANGIO VERTEBRAL SEL SUBCLAVIAN INNOMINATE BILAT MOD SED  01/20/2021   IR US GUIDE VASC ACCESS RIGHT  01/20/2021   LAPAROSCOPIC APPENDECTOMY N/A 02/15/2020   Procedure: APPENDECTOMY LAPAROSCOPIC;  Surgeon: Jovita Kussmaul, MD;  Location: [facility];  Service: General;  Laterality: N/A;   TONSILLECTOMY  as child   TOTAL HIP ARTHROPLASTY Left 05/19/2016   Procedure: LEFT TOTAL HIP ARTHROPLASTY ANTERIOR APPROACH;  Surgeon: Mcarthur Rossetti, MD;  Location: [facility];  Service: Orthopedics;  Laterality: Left;   TRANSURETHRAL RESECTION OF PROSTATE     VAGOTOMY   1950's    Allergies: Bee venom  Medications: Prior to Admission medications   Medication Sig Start Date End Date Taking? Authorizing Provider  acetaminophen (TYLENOL) 500 MG tablet Take 1 tablet (500 mg total) by mouth every 6 (six) hours as needed for mild pain. Patient taking differently: Take 1,000 mg by mouth every 6 (six) hours as needed for mild pain. 02/19/20   [name], PA-C  albuterol (VENTOLIN HFA) 108 (90 Base) MCG/ACT inhaler Inhale 2 puffs into the lungs every 6 (six) hours as needed for wheezing or shortness of breath.    [provider]  Ascorbic Acid (VITAMIN C) 1000 MG tablet Take 1,000 mg by mouth daily.    [provider]  aspirin EC 81 MG tablet Take 81 mg by mouth daily.    [provider]  calcium carbonate (TUMS - DOSED IN MG ELEMENTAL CALCIUM) 500 MG chewable tablet Chew 1,000 mg by mouth daily as needed for indigestion or heartburn.    [provider]  cyanocobalamin (,VITAMIN B-12,) 1000 MCG/ML injection Inject 1,000 mcg into the muscle every 30 (thirty) days.    [provider]  Denosumab (XGEVA [place]) Inject 1 Dose into the skin every 28 (twenty-eight) days.  [provider]  glycerin adult 2 g suppository Place 1 suppository rectally as needed for constipation. 02/19/20   [name], PA-C  guaiFENesin (MUCINEX) 600 MG 12 hr tablet Take 1,200 mg by mouth 2 (two) times daily.     [provider]  hydroxypropyl methylcellulose / hypromellose (ISOPTO TEARS / GONIOVISC) 2.5 % ophthalmic solution Place 1 drop into both eyes daily as needed for dry eyes.    [provider]  MAGNESIUM PO Take 1 tablet by mouth daily as needed (shortness of breath).    [provider]  meclizine (ANTIVERT) 25 MG tablet Take 25 mg by mouth See admin instructions. Take 25 mg daily, may take a second 25 mg dose as needed for dizziness    [provider]  Misc Natural Products (NEURIVA PO) Take 1  capsule by mouth daily.    [provider]  Multiple Vitamin (MULTIVITAMIN WITH MINERALS) TABS tablet Take 1 tablet by mouth every morning.    [provider]  OVER THE COUNTER MEDICATION Apply 1 application topically daily as needed (pain). Hempvana otc pain relieving cream    [provider]  PARoxetine (PAXIL) 40 MG tablet Take 1 tablet (40 mg total) by mouth daily. Patient taking differently: Take 40 mg by mouth at bedtime. 06/13/18   Danford, Suann Larry, MD  Psyllium (METAMUCIL PO) Take 1 Dose by mouth 3 (three) times a week. 1 dose = 3 teaspoonful    [provider]  senna-docusate (SENOKOT-S) 8.6-50 MG tablet Take 1 tablet by mouth at bedtime. Patient taking differently: Take 2 tablets by mouth 2 (two) times daily. 06/13/18   Danford, Suann Larry, MD  traZODone (DESYREL) 50 MG tablet Take 50 mg by mouth at bedtime as needed for sleep. 01/19/20   [provider]  VITAMIN E PO Take 1 capsule by mouth every Wednesday.    [provider]     Family History  Problem Relation Age of Onset   Heart disease Neg Hx     Social History   Socioeconomic History   Marital status: Married    Spouse name: Not on file   Number of children: Not on file   Years of education: Not on file   Highest education level: Not on file  Occupational History   Not on file  Tobacco Use   Smoking status: Former    Pack years: 0.00    Types: Cigarettes   Smokeless tobacco: Former    Types: Chew   Tobacco comments:    quit yrs ago  Vaping Use   Vaping Use: Never used  Substance and Sexual Activity   Alcohol use: Not Currently   Drug use: No   Sexual activity: Not Currently  Other Topics Concern   Not on file  Social History Narrative   Not on file   Social Determinants of Health   Financial Resource Strain: Not on file  Food Insecurity: Not on file  Transportation Needs: Not on file  Physical Activity: Not on file  Stress: Not on file   Social Connections: Not on file     Vital Signs: There were no vitals taken for this visit.  Physical Exam  Imaging: DIAGNOSTIC CEREBRAL ANGIOGRAM  IMPRESSION: 1. Atherosclerotic changes resulting in approximately 50% stenosis at the origin of the right vertebral artery and approximately 60-70% stenosis at the origin of the left vertebral artery. 2. Approximately 60% stenosis at the distal V4 segment of the right vertebral artery. 3. Short  segment of severe stenosis of the proximal left posterior cerebral artery. 4. No hemodynamically significant stenosis of the anterior circulation. Electronically Signed   By: Pedro Earls M.D.   On: 01/20/2021 14:12  Labs:  CBC: Recent Labs    10/08/20 0356 10/09/20 0254 10/10/20 0216 01/20/21 0710  WBC 8.9 8.2 7.4 7.9  HGB 12.5* 11.5* 10.6* 13.2  HCT 36.2* 33.9* 31.5* 41.0  PLT 146* 182 166 231    COAGS: Recent Labs    10/05/20 0949 01/20/21 0710  INR 1.0 0.9  APTT 30  --     BMP: Recent Labs    02/16/20 0650 02/17/20 0342 02/18/20 0341 02/19/20 0309 10/05/20 0949 10/08/20 0356 10/09/20 0254 10/10/20 0216 01/20/21 0710  NA 129* 129* 128* 132*   < > 135 133* 136 136  K 4.9 4.6 4.4 4.2   < > 4.5 5.2* 4.3 4.2  CL 98 97* 97* 100   < > 102 98 104 100  CO2 21* 23 21* 23   < > 23 27 25 28   GLUCOSE 135* 111* 116* 103*   < > 99 109* 115* 107*  BUN 27* 24* 20 18   < > 16 19 21 16   CALCIUM 8.5* 8.4* 8.5* 8.7*   < > 8.9 8.8* 8.7* 8.7*  CREATININE 2.01* 1.97* 1.90* 1.86*   < > 1.53* 1.67* 1.71* 1.52*  GFRNONAA 29* 30* 31* 32*   < > 44* 39* 38* 44*  GFRAA 34* 34* 36* 37*  --   --   --   --   --    < > = values in this interval not displayed.    LIVER FUNCTION TESTS: Recent Labs    10/07/20 0331 10/08/20 0356 10/09/20 0254 10/10/20 0216  BILITOT 0.6 1.0 0.7 0.7  AST 19 21 17  14*  ALT 12 9 7 9   ALKPHOS 70 63 62 54  PROT 6.4* 6.3* 5.8* 5.4*  ALBUMIN 3.1* 2.9* 2.7* 2.6*     Assessment:  Steven Cain is a very pleasant 87 year old male who was found to have significant cerebrovascular atherosclerotic disease, mostly restricted to the posterior circulation, with moderate stenosis at the origin of the bilateral vertebral arteries, moderate stenosis of the intracranial right vertebral artery (non-dominant) and severe stenosis of the proximal left posterior cerebral artery. I personally discussed the results of the cerebral angiogram with Dr. Leonie Man on the day of the procedure and reviewed images with Mr. Schools and his wife today. Given stability of Mr. Dolinger symptoms, we believe he should continue management of this condition medically as directed by Dr. Leonie Man. Should he develop signs and symptoms suggestive of medical management failure, we would consider angioplasty and stenting of the origin of his dominant left vertebral artery. All questions were answered. Patient as his wife understand and agree with plan.  Signed: Pedro Earls, MD 02/04/2021, 11:53 AM    I spent a total of    15 Minutes in face to face in clinical consultation, greater than 50% of which was counseling/coordinating care for cerebrovascular atherosclerotic disease.

## 2021-02-08 ENCOUNTER — Other Ambulatory Visit: Payer: Self-pay

## 2021-02-08 ENCOUNTER — Encounter: Payer: Self-pay | Admitting: Neurology

## 2021-02-08 ENCOUNTER — Ambulatory Visit (INDEPENDENT_AMBULATORY_CARE_PROVIDER_SITE_OTHER): Payer: Medicare Other | Admitting: Neurology

## 2021-02-08 VITALS — BP 148/70 | HR 50 | Ht 64.0 in | Wt 164.4 lb

## 2021-02-08 DIAGNOSIS — R55 Syncope and collapse: Secondary | ICD-10-CM

## 2021-02-08 DIAGNOSIS — I6503 Occlusion and stenosis of bilateral vertebral arteries: Secondary | ICD-10-CM | POA: Diagnosis not present

## 2021-02-08 NOTE — Progress Notes (Signed)
[facility] [address] [place]. [place] [address] [phone]       OFFICE CONSULT NOTE  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  Is accompanied by his wife.  History is obtained from them and review of electronic medical records and I personally reviewed available imaging films in PACS.  He has a past medical history of prostate cancer with bony mets currently on testosterone antagonist treatment, grade 1 diastolic dysfunction, hypertension, chronic kidney disease stage III, remote history of seizures in the past in setting of alcohol use or withdrawal with last seizure being 37 years ago and not been on long-term antiepileptics.  He presented to [facility] on 10/06/2020 with an unwitnessed episode.  His wife heard a loud noise and the patient went to the bathroom and was found on the floor unresponsive.  He was gurgling at the mouth with thick phlegm in his throat and appeared to have passed out.  There was a question of momentary loss of pulses.  The daughter on arrival of the nurse noted some tonic-clonic activity.  EMS was called and in route noticed patient had bradycardia and cardiology was consulted after admission his heart rate remained in the low 30s.  He was noted to be into his 2 1 AV block.  MRI scan of the brain was obtained which was unremarkable for any acute abnormalities.  Radiologist on call questioned DWI changes on the cortex on the right concerning for seizure but in my opinion these are likely artifact and neuroradiologist also concurred.  EEG was normal.  Patient apparently has had somewhat similar episode in the past.  Patient stated that this time he remembers that he  had still not been feeling well and discomfort he had eaten something sweet which usually causes him to have a gastric dumping syndrome.  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  His not had any seizures for more than 30 years.  He does have longstanding history of psychiatric disorders including treatment with the ECTs for severe depression in the past.  He has had Roux and Y gastric bypass surgery and does have history of gastric dumping syndrome particularly when he eats certain sweet foods.  Wife is aware of at least 1 more episode of syncope in the past when he became all of a sudden sweaty with sick feeling to his stomach and face and skin went gray and he passed out briefly.  Patient does have longstanding history of gait and balance problems and does admit to feeling dizzy and lightheaded when he extends his neck or turns head quickly to one side.  He also has a tendency to form backwards.  He has not had any recent neurovascular imaging done.  He had an appointment with Dr. Caryl Cain electrophysiologist later on 10/13/2020 who felt patient had orthostatic hypotension causing his syncope as he documented a 70 point systolic drop.  Appeared much improved with compressive wear started in the hospital and abdominal binder and thigh sleeves.  He had echocardiogram on 08/05/2021 which showed ejection fraction of 55 to 60% with no regional wall motion abnormalities. Update 02/08/2021: He returns  for follow-up after last visit with me to the end of months ago.  Is accompanied by his wife.  Patient states she is done well has had no stroke or TIA symptoms.  He underwent EEG on 12/15/2020 which was normal.  Previous EEG on 10/06/2020 was also normal.  CT angiogram of the brain and neck on 12/02/2020 had shown shown multifocal intracranial stenosis with moderate left cavernous carotid and mild distal right M2 middle cerebral artery stenosis as well as  severe stenosis of distal right V4 vertebral artery.  Severe stenosis of the P2 segment of the right posterior cerebral artery.  CT angiogram of the neck had shown moderate to severe's narrowing of the origin of the right vertebral and mild narrowing at the origin of the left vertebral artery.  He subsequently underwent diagnostic cerebral catheter angiogram by Dr. Manson Cain on 01/20/2021 which showed only 50% stenosis origin of the right vertebral and 60 to 70% stenosis origin of left vertebral artery and 60% stenosis of distal V4 segment of the right vertebral artery and short segment severe stenosis proximal left posterior cerebral artery.  Patient was recommended aggressive medical therapy and no intervention was indicated at the present time.  Patient states is done well since discharge has had no new complaints.  He is tolerating aspirin well without any side effects.  He does use his walker to ambulate for short distances.  He uses wheelchair for long distances.  He states his balance is poor but he has had no falls or injuries. ROS:   14 system review of systems is positive for syncope, jerking, loss of consciousness, gait difficulty, balance problems, neck pain increased fall risk and all other systems negative  PMH:  Past Medical History:  Diagnosis Date   Abnormal EKG    hx of left anterior fasicular block on ekg, no cardiologist   Anxiety    AR (aortic regurgitation) 03/23/2017   Mild, Noted on ECHO   Arthritis    DJD   Benign enlargement of prostate    frequent UTI, trim of prostate done , with some issues of incontinence now.   Chronic bronchitis (HCC)    chronic- usually has phelgm often   COVID-19 08/2019   fatigue and loss of taste and smell x 10 days did monoclonal antibody therapy   Depression    Dyspnea    with excertion   Fluid retention in legs    resolved   GERD (gastroesophageal reflux disease)    rare   GI bleed    Grade I diastolic dysfunction    Hard of  hearing    hear more on left side- no hearing aid   History of colon polyps    History of transfusion 2016   Hydronephrosis 2018   Left ckd stage 3 no nephrologist per wife adele   Hypertension    Moderate concentric left ventricular hypertrophy 03/23/2017   Noted on ECHO   Perforated, severe stomach ulcer ([place]) 1974   Peripheral neuropathy    feet and toes   Spinal stenosis    TR (tricuspid regurgitation) 03/23/2017   Mild, Noted on ECHO   Transfusion history    none recent   Ureteral stricture    CHRONIC   Urinary incontinence    UTI (urinary tract infection)    frequent   Wears glasses     Social History:  Social History   Socioeconomic History   Marital status: Married    Spouse name: Not on  file   Number of children: Not on file   Years of education: Not on file   Highest education level: Not on file  Occupational History   Not on file  Tobacco Use   Smoking status: Former    Pack years: 0.00    Types: Cigarettes   Smokeless tobacco: Former    Types: Chew   Tobacco comments:    quit yrs ago  Vaping Use   Vaping Use: Never used  Substance and Sexual Activity   Alcohol use: Not Currently   Drug use: No   Sexual activity: Not Currently  Other Topics Concern   Not on file  Social History Narrative   Lives with wife and grandchildren   Right Handed   Drink 1-2 cups caffeine daily   Social Determinants of Health   Financial Resource Strain: Not on file  Food Insecurity: Not on file  Transportation Needs: Not on file  Physical Activity: Not on file  Stress: Not on file  Social Connections: Not on file  Intimate Partner Violence: Not on file    Medications:   Current Outpatient Medications on File Prior to Visit  Medication Sig Dispense Refill   acetaminophen (TYLENOL) 500 MG tablet Take 1 tablet (500 mg total) by mouth every 6 (six) hours as needed for mild pain. (Patient taking differently: Take 1,000 mg by mouth every 6 (six) hours as needed for  mild pain.)     albuterol (VENTOLIN HFA) 108 (90 Base) MCG/ACT inhaler Inhale 2 puffs into the lungs every 6 (six) hours as needed for wheezing or shortness of breath.     Ascorbic Acid (VITAMIN C) 1000 MG tablet Take 1,000 mg by mouth daily.     aspirin EC 81 MG tablet Take 81 mg by mouth daily.     calcium carbonate (TUMS - DOSED IN MG ELEMENTAL CALCIUM) 500 MG chewable tablet Chew 1,000 mg by mouth daily as needed for indigestion or heartburn.     cyanocobalamin (,VITAMIN B-12,) 1000 MCG/ML injection Inject 1,000 mcg into the muscle every 30 (thirty) days.     Denosumab (XGEVA [place]) Inject 1 Dose into the skin every 28 (twenty-eight) days.     glycerin adult 2 g suppository Place 1 suppository rectally as needed for constipation.     guaiFENesin (MUCINEX) 600 MG 12 hr tablet Take 1,200 mg by mouth 2 (two) times daily.      hydroxypropyl methylcellulose / hypromellose (ISOPTO TEARS / GONIOVISC) 2.5 % ophthalmic solution Place 1 drop into both eyes daily as needed for dry eyes.     MAGNESIUM PO Take 1 tablet by mouth daily as needed (shortness of breath).     meclizine (ANTIVERT) 25 MG tablet Take 25 mg by mouth See admin instructions. Take 25 mg daily, may take a second 25 mg dose as needed for dizziness     Misc Natural Products (NEURIVA PO) Take 1 capsule by mouth daily.     Multiple Vitamin (MULTIVITAMIN WITH MINERALS) TABS tablet Take 1 tablet by mouth every morning.     OVER THE COUNTER MEDICATION Apply 1 application topically daily as needed (pain). Hempvana otc pain relieving cream     PARoxetine (PAXIL) 40 MG tablet Take 1 tablet (40 mg total) by mouth daily. (Patient taking differently: Take 40 mg by mouth at bedtime.) 30 tablet 11   Psyllium (METAMUCIL PO) Take 1 Dose by mouth 3 (three) times a week. 1 dose = 3 teaspoonful     senna-docusate (SENOKOT-S) 8.6-50  MG tablet Take 1 tablet by mouth at bedtime. (Patient taking differently: Take 2 tablets by mouth 2 (two) times daily.) 30 tablet 0    traZODone (DESYREL) 50 MG tablet Take 50 mg by mouth at bedtime as needed for sleep.     VITAMIN E PO Take 1 capsule by mouth every Wednesday.     No current facility-administered medications on file prior to visit.    Allergies:   Allergies  Allergen Reactions   Bee Venom Swelling    Physical Exam General: Frail elderly Caucasian male seated, in no evident distress Head: head normocephalic and atraumatic.   Neck: supple with no carotid or supraclavicular bruits Cardiovascular: regular rate and rhythm, no murmurs Musculoskeletal: mild Kyphoscoliosis. Skin:  no rash/petichiae Vascular:  Normal pulses all extremities  Neurologic Exam Mental Status: Awake and fully alert. Oriented to place and time. Recent and remote memory intact. Attention span, concentration and fund of knowledge appropriate. Mood and affect appropriate.  Cranial Nerves: Fundoscopic exam not dones. Pupils equal, briskly reactive to light. Extraocular movements full without nystagmus. Visual fields full to confrontation. Hearing intact. Facial sensation intact. Face, tongue, palate moves normally and symmetrically.  Motor: Normal bulk and tone. Normal strength in all tested extremity muscles but effort is not consistent and full.. Sensory.: intact to touch , pinprick , position and vibratory sensation.  Coordination: Rapid alternating movements normal in all extremities. Finger-to-nose and heel-to-shin performed accurately bilaterally. Gait and Station: Gait deferred as patient is a fall risk and is sitting in a wheelchair Reflexes: 1+ and symmetric. Toes downgoing.      ASSESSMENT: 87 year old patient with episode of unwitnessed loss of consciousness with some jerking possibly convulsive syncope rather than seizure.  Given nonfocal neurological exam and unremarkable brain imaging and EEG will hold off on anticonvulsants for now particularly given documentation of significant orthostatic hypotension which could have  contributed..  Remote history of possible seizures related to alcohol in the past as well as a history of syncopal episodes.     PLAN: I had a long discussion with patient and his wife regarding his syncopal episodes and discussed results of EEG and cerebral catheter angiogram and answered questions.  I recommend he continue aspirin 81 mg daily and maintain aggressive risk factor modification with strict control of hypertension blood pressure goal below 130/90, lipids with LDL cholesterol goal below 70 mg percent and diabetes with hemoglobin A1c goal below 6.5%.  Continue medical management for his asymptomatic vertebral artery stenosis for now . He will return for f/u in 6 months or call earlier if needed.I also advised him to drink enough fluids and do orthostatic tolerance exercises before getting up or standing.Greater than 50% time during this 25-minute  visit was spent on counseling and coordination of care about convulsive syncope versus seizure and answering questions. Antony Contras, MD Note: This document was prepared with digital dictation and possible smart phrase technology. Any transcriptional errors that result from this process are unintentional.

## 2021-02-08 NOTE — Patient Instructions (Signed)
I had a long discussion with patient and his wife regarding his syncopal episodes and discussed results of EEG and cerebral catheter angiogram and answered questions.  I recommend he continue aspirin 81 mg daily and maintain aggressive risk factor modification with strict control of hypertension blood pressure goal below 130/90, lipids with LDL cholesterol goal below 70 mg percent and diabetes with hemoglobin A1c goal below 6.5%.  Continue medical management for his asymptomatic vertebral artery stenosis for now . He will return for f/u in 6 months or call earlier if needed.I also advised him to drink enough fluids and do orthostatic tolerance exercises before getting up or standing.

## 2021-03-04 DIAGNOSIS — C61 Malignant neoplasm of prostate: Secondary | ICD-10-CM | POA: Diagnosis not present

## 2021-03-04 DIAGNOSIS — C7951 Secondary malignant neoplasm of bone: Secondary | ICD-10-CM | POA: Diagnosis not present

## 2021-03-18 ENCOUNTER — Telehealth: Payer: Self-pay | Admitting: Internal Medicine

## 2021-03-18 ENCOUNTER — Ambulatory Visit (INDEPENDENT_AMBULATORY_CARE_PROVIDER_SITE_OTHER): Payer: Medicare Other | Admitting: Cardiology

## 2021-03-18 ENCOUNTER — Other Ambulatory Visit: Payer: Self-pay

## 2021-03-18 VITALS — BP 156/72 | HR 44 | Ht 65.0 in | Wt 165.0 lb

## 2021-03-18 DIAGNOSIS — I951 Orthostatic hypotension: Secondary | ICD-10-CM | POA: Diagnosis not present

## 2021-03-18 DIAGNOSIS — R001 Bradycardia, unspecified: Secondary | ICD-10-CM | POA: Diagnosis not present

## 2021-03-18 DIAGNOSIS — J189 Pneumonia, unspecified organism: Secondary | ICD-10-CM | POA: Diagnosis not present

## 2021-03-18 DIAGNOSIS — R55 Syncope and collapse: Secondary | ICD-10-CM | POA: Diagnosis not present

## 2021-03-18 DIAGNOSIS — N1832 Chronic kidney disease, stage 3b: Secondary | ICD-10-CM | POA: Diagnosis not present

## 2021-03-18 DIAGNOSIS — I441 Atrioventricular block, second degree: Secondary | ICD-10-CM

## 2021-03-18 DIAGNOSIS — R051 Acute cough: Secondary | ICD-10-CM | POA: Diagnosis not present

## 2021-03-18 DIAGNOSIS — I129 Hypertensive chronic kidney disease with stage 1 through stage 4 chronic kidney disease, or unspecified chronic kidney disease: Secondary | ICD-10-CM | POA: Diagnosis not present

## 2021-03-18 NOTE — Telephone Encounter (Signed)
Attempted phone call to [facility] with [facility] and left voicemail message to contact RN at [phone] until 1230pm.

## 2021-03-18 NOTE — Patient Instructions (Addendum)
Medication Instructions:  Your physician recommends that you continue on your current medications as directed. Please refer to the Current Medication list given to you today. *If you need a refill on your cardiac medications before your next appointment, please call your pharmacy*  Lab Work: None ordered. If you have labs (blood work) drawn today and your tests are completely normal, you will receive your results only by: [place] (if you have MyChart) OR A paper copy in the mail If you have any lab test that is abnormal or we need to change your treatment, we will call you to review the results.  Testing/Procedures: None ordered.  Follow-Up: At [facility], you and your health needs are our priority.  As part of our continuing mission to provide you with exceptional heart care, we have created designated Provider Care Teams.  These Care Teams include your primary Cardiologist (physician) and Advanced Practice Providers (APPs -  Physician Assistants and Nurse Practitioners) who all work together to provide you with the care you need, when you need it.  Your next appointment:   Your physician wants you to follow-up with Dr. Caryl Comes as scheduled

## 2021-03-18 NOTE — Progress Notes (Signed)
Electrophysiology Office Note:    Date:  03/18/2021   ID:  Steven Cain, DOB September 08, 1930, MRN [id number]  PCP:  Shon Baton, MD  [facility] Cardiologist:  None    Referring MD: Shon Baton, MD   Chief Complaint: syncope  History of Present Illness:    Steven Cain is a 87 y.o. male who presents for an evaluation of syncope at the request of Dr. Virgina Jock. Their medical history includes orthostatic hypotension, Mobitz 1, first-degree AV delay, Roux-en-Y gastric bypass, hypertension LVH.  The patient was seen by his primary care [facility] today.  According to the record, he woke up Tuesday morning, sat up in bed and had syncope falling backwards.  He was not out for long during that event.  Yesterday, the patient became faint after taking a shower.  He has seen Dr. Caryl Comes in the past for his orthostatic hypotension.  He has been wearing thigh-high TED hose, abdominal binders and elevating the head of his bed.  His heart rates have been in the 30s and 40s.  The patient is with his wife today in clinic.  The patient has been adjusting his antihypertensive therapy recently at home and prior to the most recent syncopal episode had restarted amlodipine for a few days.  His symptoms continue to be predominantly with changes in position.  He does not have any prodrome before passing out.  He tells me the most recent passing out episode he was sitting on his bed.  He had set up in bed from laying down when he suddenly passed out and fell backwards.  His wife tells me that he has progressively gotten more fatigued recently.  She wonders whether or not this may be related to his low resting heart rate.    Past Medical History:  Diagnosis Date   Abnormal EKG    hx of left anterior fasicular block on ekg, no cardiologist   Anxiety    AR (aortic regurgitation) 03/23/2017   Mild, Noted on ECHO   Arthritis    DJD   Benign enlargement of prostate    frequent UTI, trim of prostate  done , with some issues of incontinence now.   Chronic bronchitis (HCC)    chronic- usually has phelgm often   COVID-19 08/2019   fatigue and loss of taste and smell x 10 days did monoclonal antibody therapy   Depression    Dyspnea    with excertion   Fluid retention in legs    resolved   GERD (gastroesophageal reflux disease)    rare   GI bleed    Grade I diastolic dysfunction    Hard of hearing    hear more on left side- no hearing aid   History of colon polyps    History of transfusion 2016   Hydronephrosis 2018   Left ckd stage 3 no nephrologist per wife adele   Hypertension    Moderate concentric left ventricular hypertrophy 03/23/2017   Noted on ECHO   Perforated, severe stomach ulcer ([place]) 1974   Peripheral neuropathy    feet and toes   Spinal stenosis    TR (tricuspid regurgitation) 03/23/2017   Mild, Noted on ECHO   Transfusion history    none recent   Ureteral stricture    CHRONIC   Urinary incontinence    UTI (urinary tract infection)    frequent   Wears glasses     Past Surgical History:  Procedure Laterality Date   ABDOMINAL SURGERY  bilroths tube '74   COLONOSCOPY N/A 09/29/2014   Procedure: COLONOSCOPY;  Surgeon: Beryle Beams, MD;  Location: [facility];  Service: Endoscopy;  Laterality: N/A;   CYSTOSCOPY W/ URETERAL STENT PLACEMENT Left 05/06/2014   Procedure: CYSTOSCOPY WITH RETROGRADE PYELOGRAM/URETERAL STENT PLACEMENT;  Surgeon: [name], MD;  Location: [facility];  Service: Urology;  Laterality: Left;   CYSTOSCOPY W/ URETERAL STENT PLACEMENT Left 03/31/2015   Procedure: CYSTOSCOPY WITH LEFT RETROGRADE PYELOGRAM/URETERAL STENT EXCHANGE;  Surgeon: [name], MD;  Location: [facility];  Service: Urology;  Laterality: Left;   CYSTOSCOPY W/ URETERAL STENT PLACEMENT Left 09/15/2015   Procedure: CYSTOSCOPY WITH LEFT RETROGRADE PYELOGRAM/URETERAL STENT EXCHANGE ;  Surgeon: [name], MD;  Location: [facility];  Service: Urology;  Laterality: Left;    CYSTOSCOPY W/ URETERAL STENT PLACEMENT Left 04/28/2016   Procedure: CYSTOSCOPY WITH RETROGRADE PYELOGRAM/URETERAL STENT EXCHANGE;  Surgeon: [name], MD;  Location: [facility];  Service: Urology;  Laterality: Left;   CYSTOSCOPY W/ URETERAL STENT PLACEMENT Left 11/08/2016   Procedure: CYSTOSCOPY WITH RETROGRADE PYELOGRAM/URETERAL STENT EXCHANGE;  Surgeon: [name], MD;  Location: [facility];  Service: Urology;  Laterality: Left;   CYSTOSCOPY W/ URETERAL STENT PLACEMENT Left 09/14/2017   Procedure: CYSTOSCOPY WITH RETROGRADE PYELOGRAM/URETERAL STENT PLACEMENT;  Surgeon: [name], MD;  Location: [facility];  Service: Urology;  Laterality: Left;   CYSTOSCOPY W/ URETERAL STENT PLACEMENT Left 04/10/2018   Procedure: CYSTOSCOPY WITH LEFT RETROGRADE URETERAL STENT EXCHANGE;  Surgeon: [name], MD;  Location[facility];  Service: Urology;  Laterality: Left;   CYSTOSCOPY W/ URETERAL STENT PLACEMENT Left 01/31/2019   Procedure: CYSTOSCOPY WITH RETROGRADE PYELOGRAM/URETERAL STENT PLACEMENT;  Surgeon: [name], MD;  Location: [facility];  Service: Urology;  Laterality: Left;  Edie PLACEMENT Left 01/16/2020   Procedure: CYSTOSCOPY WITH RETROGRADE PYELOGRAM/URETERAL STENT EXCHANGED;  Surgeon: [name], MD;  [facility];  Service: Urology;  Laterality: Left;   CYSTOSCOPY WITH RETROGRADE PYELOGRAM, URETEROSCOPY AND STENT PLACEMENT Left 10/21/2014   Procedure: CYSTOSCOPY WITH RETROGRADE PYELOGRAM, URETEROSCOPY,BIOPSY AND STENT CHANGE;  Surgeon: [name], MD;  Location: [facility];  Service: Urology;  Laterality: Left;   CYSTOSCOPY/RETROGRADE/URETEROSCOPY Left 06/03/2014   Procedure: CYSTOSCOPY/RETROGRADE/URETEROSCOPY WITH BIOPSY,STENT EXCHANGE;  Surgeon: [name], MD;  Location: [facility];  Service: Urology;  Laterality: Left;   ESOPHAGOGASTRODUODENOSCOPY N/A 09/28/2014   Procedure: ESOPHAGOGASTRODUODENOSCOPY (EGD);   Surgeon: Beryle Beams, MD;  Location: [facility];  Service: Endoscopy;  Laterality: N/A;   GASTRIC ROUX-EN-Y     '74   HERNIA REPAIR     bilateral hernia repairs    IR ANGIO INTRA EXTRACRAN SEL COM CAROTID INNOMINATE BILAT MOD SED  01/20/2021   IR ANGIO VERTEBRAL SEL SUBCLAVIAN INNOMINATE BILAT MOD SED  01/20/2021   IR US GUIDE VASC ACCESS RIGHT  01/20/2021   LAPAROSCOPIC APPENDECTOMY N/A 02/15/2020   Procedure: APPENDECTOMY LAPAROSCOPIC;  Surgeon: Jovita Kussmaul, MD;  Location: [facility];  Service: General;  Laterality: N/A;   TONSILLECTOMY  as child   TOTAL HIP ARTHROPLASTY Left 05/19/2016   Procedure: LEFT TOTAL HIP ARTHROPLASTY ANTERIOR APPROACH;  Surgeon: Mcarthur Rossetti, MD;  Location: [facility];  Service: Orthopedics;  Laterality: Left;   TRANSURETHRAL RESECTION OF PROSTATE     VAGOTOMY  1950's    Current Medications: Current Meds  Medication Sig   acetaminophen (TYLENOL) 500 MG tablet Take 1 tablet (500 mg total) by mouth every 6 (six) hours as needed for mild pain. (  Patient taking differently: Take 1,000 mg by mouth every 6 (six) hours as needed for mild pain.)   albuterol (VENTOLIN HFA) 108 (90 Base) MCG/ACT inhaler Inhale 2 puffs into the lungs every 6 (six) hours as needed for wheezing or shortness of breath.   Ascorbic Acid (VITAMIN C) 1000 MG tablet Take 1,000 mg by mouth daily.   aspirin EC 81 MG tablet Take 81 mg by mouth daily.   calcium carbonate (TUMS - DOSED IN MG ELEMENTAL CALCIUM) 500 MG chewable tablet Chew 1,000 mg by mouth daily as needed for indigestion or heartburn.   cyanocobalamin (,VITAMIN B-12,) 1000 MCG/ML injection Inject 1,000 mcg into the muscle every 30 (thirty) days.   Denosumab (XGEVA [place]) Inject 1 Dose into the skin every 28 (twenty-eight) days.   glycerin adult 2 g suppository Place 1 suppository rectally as needed for constipation.   guaiFENesin (MUCINEX) 600 MG 12 hr tablet Take 1,200 mg by mouth 2 (two) times daily.    hydroxypropyl methylcellulose /  hypromellose (ISOPTO TEARS / GONIOVISC) 2.5 % ophthalmic solution Place 1 drop into both eyes daily as needed for dry eyes.   MAGNESIUM PO Take 1 tablet by mouth daily as needed (shortness of breath).   meclizine (ANTIVERT) 25 MG tablet Take 25 mg by mouth See admin instructions. Take 25 mg daily, may take a second 25 mg dose as needed for dizziness   Misc Natural Products (NEURIVA PO) Take 1 capsule by mouth daily.   Multiple Vitamin (MULTIVITAMIN WITH MINERALS) TABS tablet Take 1 tablet by mouth every morning.   OVER THE COUNTER MEDICATION Apply 1 application topically daily as needed (pain). Hempvana otc pain relieving cream   PARoxetine (PAXIL) 40 MG tablet Take 1 tablet (40 mg total) by mouth daily. (Patient taking differently: Take 40 mg by mouth at bedtime.)   Psyllium (METAMUCIL PO) Take 1 Dose by mouth 3 (three) times a week. 1 dose = 3 teaspoonful   senna-docusate (SENOKOT-S) 8.6-50 MG tablet Take 1 tablet by mouth at bedtime. (Patient taking differently: Take 2 tablets by mouth 2 (two) times daily.)   traZODone (DESYREL) 50 MG tablet Take 50 mg by mouth at bedtime as needed for sleep.   VITAMIN E PO Take 1 capsule by mouth every Wednesday.     Allergies:   Bee venom   Social History   Socioeconomic History   Marital status: Married    Spouse name: Not on file   Number of children: Not on file   Years of education: Not on file   Highest education level: Not on file  Occupational History   Not on file  Tobacco Use   Smoking status: Former    Types: Cigarettes   Smokeless tobacco: Former    Types: Chew   Tobacco comments:    quit yrs ago  Vaping Use   Vaping Use: Never used  Substance and Sexual Activity   Alcohol use: Not Currently   Drug use: No   Sexual activity: Not Currently  Other Topics Concern   Not on file  Social History Narrative   Lives with wife and grandchildren   Right Handed   Drink 1-2 cups caffeine daily   Social Determinants of Health    Financial Resource Strain: Not on file  Food Insecurity: Not on file  Transportation Needs: Not on file  Physical Activity: Not on file  Stress: Not on file  Social Connections: Not on file     Family History: The patient's family history  is negative for Heart disease.  ROS:   Please see the history of present illness.    All other systems reviewed and are negative.  EKGs/Labs/Other Studies Reviewed:    The following studies were reviewed today: [facility] records  October 07, 2020 EKG personally reviewed Sinus bradycardia with first-degree AV delay left anterior fascicular block  EKG:  The ekg ordered today demonstrates sinus bradycardia with a ventricular rate of 35 bpm.  First-degree AV delay and left anterior fascicular block  Recent Labs: 10/06/2020: TSH 1.214 10/10/2020: ALT 9; Magnesium 2.0 01/20/2021: BUN 16; Creatinine, Ser 1.52; Hemoglobin 13.2; Platelets 231; Potassium 4.2; Sodium 136  Recent Lipid Panel No results found for: CHOL, TRIG, HDL, CHOLHDL, VLDL, LDLCALC, LDLDIRECT  Physical Exam:    VS:  BP (!) 156/72   Pulse (!) 44   Ht '5\' 5"'$  (1.651 m)   Wt 165 lb (74.8 kg)   SpO2 94%   BMI 27.46 kg/m     Wt Readings from Last 3 Encounters:  03/18/21 165 lb (74.8 kg)  02/08/21 164 lb 6.4 oz (74.6 kg)  01/20/21 154 lb 5.2 oz (70 kg)     GEN:  Well nourished, well developed in no acute distress HEENT: Normal NECK: No JVD; No carotid bruits LYMPHATICS: No lymphadenopathy CARDIAC: Bradycardic, regular rhythm, no murmurs, rubs, gallops RESPIRATORY:  Clear to auscultation without rales, wheezing or rhonchi  ABDOMEN: Soft, non-tender, non-distended MUSCULOSKELETAL:  No edema; No deformity  SKIN: Warm and dry NEUROLOGIC:  Alert and oriented x 3 PSYCHIATRIC:  Normal affect   ASSESSMENT:    1. Syncope, unspecified syncope type   2. AV block, Mobitz 1   3. Sinus bradycardia    PLAN:    In order of problems listed above:   1.  Syncope, unspecified syncope type 2. AV block, Mobitz 1 3. Sinus bradycardia  The patient has symptomatic sinus bradycardia.  This has progressively worsened as his heart rate has progressively slowed.  Today in clinic his heart rates are in the upper 30s.  While I cannot be [id number] certain, I suspect his sinus bradycardia is contributing to his syncopal episodes--likely poor chronotropic response with increased demand/changes in position.  I suspect he would benefit from a permanent pacemaker.  The patient would like some time to think about it which I think is completely reasonable.  There is no evidence of significant conduction disease that would warrant emergent pacing.  I have asked him to keep their appointment with Dr. Caryl Comes later this week and in the interim think about whether or not they would like to proceed with permanent pacing.  One thought would be if they did decide to have a permanent pacemaker implanted, to use a Biotron[employer] CLS system which may help mitigate symptoms associated with fluctuations in his blood pressure.   Total time spent with patient today 45 minutes. This includes reviewing records, evaluating the patient and coordinating care.  Medication Adjustments/Labs and Tests Ordered: Current medicines are reviewed at length with the patient today.  Concerns regarding medicines are outlined above.  No orders of the defined types were placed in this encounter.  No orders of the defined types were placed in this encounter.    Signed, Hilton Cork. Quentin Ore, MD, [facility], [facility] 03/18/2021 4:55 PM    Electrophysiology [facility]

## 2021-03-18 NOTE — Telephone Encounter (Signed)
Spoke with Jennifer, [place] and requested office fax copy of pt's ECG from this morning along with office note.  Advised once received will share with Dr Quentin Ore, DOD.  Dareen Piano for assistance.

## 2021-03-18 NOTE — Telephone Encounter (Signed)
Spoke with pt's wife and advised pt's record reviewed with Dr Quentin Ore, DOD at the request of [facility].  Dr Quentin Ore would like to see pt today (08/05)at 130pm for further evaluation.  Pt's wife verbalizes understanding and agrees with current plan.

## 2021-03-18 NOTE — Telephone Encounter (Signed)
   Pt c/o Syncope: STAT if syncope occurred within 30 minutes and pt complains of lightheadedness High Priority if episode of passing out, completely, today or in last 24 hours   Did you pass out today? yes  When is the last time you passed out? today  Has this occurred multiple times? yes  Did you have any symptoms prior to passing out? Not sure  Anderson Malta with [facility] called, she said pt was there agin because pt had another syncopal episode, the PA that saw the pt today wanted to request if pt can get sooner appt than 08/11, she said she will fax the notes and gave this info:  Cbc stable, ekg: SB 39 bpm, ran chest xray and waiting result, did take half Norvasc night prior instructed to hold until further notice   Anderson Malta said to call pt back for appt and recommendations

## 2021-03-24 ENCOUNTER — Encounter: Payer: Self-pay | Admitting: Internal Medicine

## 2021-03-24 ENCOUNTER — Ambulatory Visit (INDEPENDENT_AMBULATORY_CARE_PROVIDER_SITE_OTHER): Payer: Medicare Other | Admitting: Internal Medicine

## 2021-03-24 ENCOUNTER — Other Ambulatory Visit: Payer: Self-pay

## 2021-03-24 VITALS — BP 164/80 | HR 53 | Ht 65.0 in | Wt 166.0 lb

## 2021-03-24 DIAGNOSIS — I6503 Occlusion and stenosis of bilateral vertebral arteries: Secondary | ICD-10-CM | POA: Diagnosis not present

## 2021-03-24 DIAGNOSIS — I441 Atrioventricular block, second degree: Secondary | ICD-10-CM | POA: Diagnosis not present

## 2021-03-24 DIAGNOSIS — I951 Orthostatic hypotension: Secondary | ICD-10-CM | POA: Diagnosis not present

## 2021-03-24 NOTE — Progress Notes (Signed)
Patient ID: Steven Cain, male   DOB: 1931/05/10, 87 y.o.   MRN: [id number]      Patient Care Team: Shon Baton, MD as PCP - General (Internal Medicine)   HPI  Steven Cain is a 87 y.o. male Seen following hospitalization for syncope wherein he was noted to have bradycardia with Mobitz type I heart block.  However, on more extensive history taking, his syncope was associated with standing and indeed had profound orthostatic syncope with a orthostatic decrease in systolic pressure of about 70-80 mm to a nadir of 70 mm.  Lengthy discussions were had regarding pharmacological versus nonpharmacological therapies given his very limited activity and the fact that he has been largely bedbound.  The family elected to be more aggressive that has gotten him abdominal binders and thigh sleeves which has improved his ability to get from the bed to the bathroom with less "unbalance "  He has had interval syncope and saw his PCP 03/18/2021 and was found to be bradycardic with 2: 1 heart block and rates in the 30s.  Today, the patient denies chest pain , shortness of breath, nocturnal dyspnea, orthopnea or peripheral edema.  There have been no palpitations; orthostatic lightheadedness and syncope.  The most recent episode occurred following the initiation of amlodipine for systolic blood pressures of 200.  He also has known metastatic prostate cancer for which a new medication is being included side effect profile which includes hypertension..  Date Cr K Hgb  7/21 1.86 4.2 10.6  6/22 1.52 4.2 13.2   DATE TEST EF%   08/18 Echo  55-60 %   02/22 Echo  55-60 %          Records and Results Reviewed   Past Medical History:  Diagnosis Date   Abnormal EKG    hx of left anterior fasicular block on ekg, no cardiologist   Anxiety    AR (aortic regurgitation) 03/23/2017   Mild, Noted on ECHO   Arthritis    DJD   Benign enlargement of prostate    frequent UTI, trim of prostate done , with some  issues of incontinence now.   Chronic bronchitis (HCC)    chronic- usually has phelgm often   COVID-19 08/2019   fatigue and loss of taste and smell x 10 days did monoclonal antibody therapy   Depression    Dyspnea    with excertion   Fluid retention in legs    resolved   GERD (gastroesophageal reflux disease)    rare   GI bleed    Grade I diastolic dysfunction    Hard of hearing    hear more on left side- no hearing aid   History of colon polyps    History of transfusion 2016   Hydronephrosis 2018   Left ckd stage 3 no nephrologist per wife adele   Hypertension    Moderate concentric left ventricular hypertrophy 03/23/2017   Noted on ECHO   Perforated, severe stomach ulcer ([place]) 1974   Peripheral neuropathy    feet and toes   Spinal stenosis    TR (tricuspid regurgitation) 03/23/2017   Mild, Noted on ECHO   Transfusion history    none recent   Ureteral stricture    CHRONIC   Urinary incontinence    UTI (urinary tract infection)    frequent   Wears glasses     Past Surgical History:  Procedure Laterality Date   ABDOMINAL SURGERY     bilroths tube '74  COLONOSCOPY N/A 09/29/2014   Procedure: COLONOSCOPY;  Surgeon: Beryle Beams, MD;  Location: [facility];  Service: Endoscopy;  Laterality: N/A;   CYSTOSCOPY W/ URETERAL STENT PLACEMENT Left 05/06/2014   Procedure: CYSTOSCOPY WITH RETROGRADE PYELOGRAM/URETERAL STENT PLACEMENT;  Surgeon: [name], MD;  Location: [facility];  Service: Urology;  Laterality: Left;   CYSTOSCOPY W/ URETERAL STENT PLACEMENT Left 03/31/2015   Procedure: CYSTOSCOPY WITH LEFT RETROGRADE PYELOGRAM/URETERAL STENT EXCHANGE;  Surgeon: [name], MD;  Location: [facility];  Service: Urology;  Laterality: Left;   CYSTOSCOPY W/ URETERAL STENT PLACEMENT Left 09/15/2015   Procedure: CYSTOSCOPY WITH LEFT RETROGRADE PYELOGRAM/URETERAL STENT EXCHANGE ;  Surgeon: [name], MD;  Location: [facility];  Service: Urology;  Laterality: Left;   CYSTOSCOPY W/  URETERAL STENT PLACEMENT Left 04/28/2016   Procedure: CYSTOSCOPY WITH RETROGRADE PYELOGRAM/URETERAL STENT EXCHANGE;  Surgeon: [name], MD;  [facility]: [facility];  Service: Urology;  Laterality: Left;   CYSTOSCOPY W/ URETERAL STENT PLACEMENT Left 11/08/2016   Procedure: CYSTOSCOPY WITH RETROGRADE PYELOGRAM/URETERAL STENT EXCHANGE;  Surgeon: [name], MD;  [facility];  Service: Urology;  Laterality: Left;   CYSTOSCOPY W/ URETERAL STENT PLACEMENT Left 09/14/2017   Procedure: CYSTOSCOPY WITH RETROGRADE PYELOGRAM/URETERAL STENT PLACEMENT;  Surgeon: [name], MD;  [facility];  Service: Urology;  Laterality: Left;   CYSTOSCOPY W/ URETERAL STENT PLACEMENT Left 04/10/2018   Procedure: CYSTOSCOPY WITH LEFT RETROGRADE URETERAL STENT EXCHANGE;  Surgeon: [name], MD;  Location[facility];  Service: Urology;  Laterality: Left;   CYSTOSCOPY W/ URETERAL STENT PLACEMENT Left 01/31/2019   Procedure: CYSTOSCOPY WITH RETROGRADE PYELOGRAM/URETERAL STENT PLACEMENT;  Surgeon: [name], MD;  [facility];  Service: Urology;  Laterality: Left;  [place] PLACEMENT Left 01/16/2020   Procedure: CYSTOSCOPY WITH RETROGRADE PYELOGRAM/URETERAL STENT EXCHANGED;  Surgeon: [name], MD;  Location: [facility];  Service: Urology;  Laterality: Left;   CYSTOSCOPY WITH RETROGRADE PYELOGRAM, URETEROSCOPY AND STENT PLACEMENT Left 10/21/2014   Procedure: CYSTOSCOPY WITH RETROGRADE PYELOGRAM, URETEROSCOPY,BIOPSY AND STENT CHANGE;  Surgeon: [name], MD;  Location[facility];  Service: Urology;  Laterality: Left;   CYSTOSCOPY/RETROGRADE/URETEROSCOPY Left 06/03/2014   Procedure: CYSTOSCOPY/RETROGRADE/URETEROSCOPY WITH BIOPSY,STENT EXCHANGE;  Surgeon: [name], MD;  Location: [facility];  Service: Urology;  Laterality: Left;   ESOPHAGOGASTRODUODENOSCOPY N/A 09/28/2014   Procedure: ESOPHAGOGASTRODUODENOSCOPY (EGD);  Surgeon: Beryle Beams, MD;  Location: [facility];  Service: Endoscopy;  Laterality: N/A;   GASTRIC ROUX-EN-Y     '74   HERNIA REPAIR     bilateral hernia repairs    IR ANGIO INTRA EXTRACRAN SEL COM CAROTID INNOMINATE BILAT MOD SED  01/20/2021   IR ANGIO VERTEBRAL SEL SUBCLAVIAN INNOMINATE BILAT MOD SED  01/20/2021   IR US GUIDE VASC ACCESS RIGHT  01/20/2021   LAPAROSCOPIC APPENDECTOMY N/A 02/15/2020   Procedure: APPENDECTOMY LAPAROSCOPIC;  Surgeon: Jovita Kussmaul, MD;  Location: [facility];  Service: General;  Laterality: N/A;   TONSILLECTOMY  as child   TOTAL HIP ARTHROPLASTY Left 05/19/2016   Procedure: LEFT TOTAL HIP ARTHROPLASTY ANTERIOR APPROACH;  Surgeon: Mcarthur Rossetti, MD;  Location: [facility];  Service: Orthopedics;  Laterality: Left;   TRANSURETHRAL RESECTION OF PROSTATE     VAGOTOMY  1950's    Current Meds  Medication Sig   acetaminophen (TYLENOL) 500 MG tablet Take 1 tablet (500 mg total) by mouth every 6 (six) hours as needed for mild pain. (Patient taking differently: Take 1,000 mg by  mouth every 6 (six) hours as needed for mild pain.)   albuterol (VENTOLIN HFA) 108 (90 Base) MCG/ACT inhaler Inhale 2 puffs into the lungs every 6 (six) hours as needed for wheezing or shortness of breath.   Ascorbic Acid (VITAMIN C) 1000 MG tablet Take 1,000 mg by mouth daily.   aspirin EC 81 MG tablet Take 81 mg by mouth daily.   calcium carbonate (TUMS - DOSED IN MG ELEMENTAL CALCIUM) 500 MG chewable tablet Chew 1,000 mg by mouth daily as needed for indigestion or heartburn.   cyanocobalamin (,VITAMIN B-12,) 1000 MCG/ML injection Inject 1,000 mcg into the muscle every 30 (thirty) days.   Denosumab (XGEVA Poca) Inject 1 Dose into the skin every 28 (twenty-eight) days.   glycerin adult 2 g suppository Place 1 suppository rectally as needed for constipation.   guaiFENesin (MUCINEX) 600 MG 12 hr tablet Take 1,200 mg by mouth 2 (two) times daily.    hydroxypropyl methylcellulose / hypromellose (ISOPTO TEARS / GONIOVISC)  2.5 % ophthalmic solution Place 1 drop into both eyes daily as needed for dry eyes.   MAGNESIUM PO Take 1 tablet by mouth daily as needed (shortness of breath).   meclizine (ANTIVERT) 25 MG tablet Take 25 mg by mouth See admin instructions. Take 25 mg daily, may take a second 25 mg dose as needed for dizziness   Misc Natural Products (NEURIVA PO) Take 1 capsule by mouth daily.   Multiple Vitamin (MULTIVITAMIN WITH MINERALS) TABS tablet Take 1 tablet by mouth every morning.   OVER THE COUNTER MEDICATION Apply 1 application topically daily as needed (pain). Hempvana otc pain relieving cream   PARoxetine (PAXIL) 40 MG tablet Take 1 tablet (40 mg total) by mouth daily. (Patient taking differently: Take 40 mg by mouth at bedtime.)   Psyllium (METAMUCIL PO) Take 1 Dose by mouth 3 (three) times a week. 1 dose = 3 teaspoonful   senna-docusate (SENOKOT-S) 8.6-50 MG tablet Take 1 tablet by mouth at bedtime. (Patient taking differently: Take 2 tablets by mouth 2 (two) times daily.)   traZODone (DESYREL) 50 MG tablet Take 50 mg by mouth at bedtime as needed for sleep.   VITAMIN E PO Take 1 capsule by mouth every Wednesday.    Allergies  Allergen Reactions   Bee Venom Swelling   Review of Systems negative except from HPI and PMH  Physical Exam: BP (!) 164/80   Pulse (!) 53   Ht '5\' 5"'$  (1.651 m)   Wt 166 lb (75.3 kg)   SpO2 95%   BMI 27.62 kg/m    Well developed and nourished in no acute distress Lungs Clear Regular rate and rhythm, no murmurs or gallops Abd-soft with active BS No Clubbing cyanosis edema Skin-warm and dry A & Oriented  Grossly normal sensory and motor function  ECG: Sinus at 75 with 3: 2 Mobitz 1 second-degree AV block ECG 03/18/2021 sinus at 75 with 2: 1 AV block presumed second-degree    CrCl cannot be calculated (Patient's most recent lab result is older than the maximum 21 days allowed.).   Assessment and  Plan  Orthostatic syncope  Mobitz 1 second-degree heart  block  Hypertension   The patient continues with orthostatic syncope, the most recent episode aggravated by efforts to treat systolic blood pressures already high at about 200.  However, he has had systolic drops in the Q000111Q with standing with a delta of about 80 mm.  Hence, I think systolic hypertension has to be accepted.  The  issue will be how much can we tolerate and I suspect will have to be close to 200 mm.  Unfortunately, even supine hypertension at night is difficult to treated him as he is frequently up urinating.  Moreover, his change in anatomy makes the idea of being able to hold his penis in a urinal likely problematic.  We discussed importantly the implications of bedrest and stressed recurrently the need to get out of bed and to spend as much time as he can as vertically as he can i.e. in a chair.  Compressive wear was not tolerated.  ProAmatine is an option if he is up in a chair.  They will let us know if they are able to find a chair and he is willing to begin it.  I suggested a lift chair  I do not think that there is a role for pacing, not withstanding his bradycardia.  I do not think this is the issue and I do not think CLS pacing will make a sufficient difference We again discussed raising the head of the bed and using a CUBII for leg strength  In the event that the new medication started for his metastatic prostate cancer and his blood pressure systolic enters the [id number] range, then short acting medications like hydralazine might be appropriate to try to temper high pressures without hopefully triggering recurrent syncope.  In this regard, I think that syncope and trauma are the more immediate risks to his health and the consequences of high blood pressure, obviously we are in a difficult dilemma.

## 2021-03-24 NOTE — Patient Instructions (Signed)
Medication Instructions:  Your physician recommends that you continue on your current medications as directed. Please refer to the Current Medication list given to you today.  *If you need a refill on your cardiac medications before your next appointment, please call your pharmacy*   Lab Work: None ordered.  If you have labs (blood work) drawn today and your tests are completely normal, you will receive your results only by: [place] (if you have MyChart) OR A paper copy in the mail If you have any lab test that is abnormal or we need to change your treatment, we will call you to review the results.   Testing/Procedures: None ordered.    Follow-Up: At [facility], you and your health needs are our priority.  As part of our continuing mission to provide you with exceptional heart care, we have created designated Provider Care Teams.  These Care Teams include your primary Cardiologist (physician) and Advanced Practice Providers (APPs -  Physician Assistants and Nurse Practitioners) who all work together to provide you with the care you need, when you need it.  We recommend signing up for the patient portal called "MyChart".  Sign up information is provided on this After Visit Summary.  MyChart is used to connect with patients for Virtual Visits (Telemedicine).  Patients are able to view lab/test results, encounter notes, upcoming appointments, etc.  Non-urgent messages can be sent to your provider as well.   To learn more about what you can do with MyChart, go to [email or web address].    Your next appointment:   4 month(s)  The format for your next appointment:   In Person  Provider:   Virl Axe, MD

## 2021-03-31 DIAGNOSIS — C61 Malignant neoplasm of prostate: Secondary | ICD-10-CM | POA: Diagnosis not present

## 2021-04-07 DIAGNOSIS — N3941 Urge incontinence: Secondary | ICD-10-CM | POA: Diagnosis not present

## 2021-04-07 DIAGNOSIS — C61 Malignant neoplasm of prostate: Secondary | ICD-10-CM | POA: Diagnosis not present

## 2021-04-07 DIAGNOSIS — N135 Crossing vessel and stricture of ureter without hydronephrosis: Secondary | ICD-10-CM | POA: Diagnosis not present

## 2021-04-07 DIAGNOSIS — C7951 Secondary malignant neoplasm of bone: Secondary | ICD-10-CM | POA: Diagnosis not present

## 2021-04-15 ENCOUNTER — Other Ambulatory Visit: Payer: Self-pay | Admitting: Urology

## 2021-04-26 NOTE — Anesthesia Preprocedure Evaluation (Addendum)
Anesthesia Evaluation  Patient identified by MRN, date of birth, ID band Patient awake    Reviewed: Allergy & Precautions, NPO status , Patient's Chart, lab work & pertinent test results  Airway Mallampati: II  TM Distance: >3 FB     Dental  (+) Dental Advisory Given   Pulmonary former smoker,    breath sounds clear to auscultation       Cardiovascular hypertension, Pt. on medications + dysrhythmias  Rhythm:Regular Rate:Normal  Orthostatic hypotension   Neuro/Psych Seizures -,   Neuromuscular disease    GI/Hepatic Neg liver ROS, PUD, GERD  ,  Endo/Other  negative endocrine ROS  Renal/GU Renal disease     Musculoskeletal  (+) Arthritis ,   Abdominal   Peds  Hematology  (+) anemia ,   Anesthesia Other Findings   Reproductive/Obstetrics                             Lab Results  Component Value Date   WBC 10.6 (H) 04/27/2021   HGB 12.9 (L) 04/27/2021   HCT 40.7 04/27/2021   MCV 89.3 04/27/2021   PLT 259 04/27/2021   Lab Results  Component Value Date   CREATININE 1.48 (H) 04/27/2021   BUN 20 04/27/2021   NA 135 04/27/2021   K 4.7 04/27/2021   CL 101 04/27/2021   CO2 26 04/27/2021    Anesthesia Physical Anesthesia Plan  ASA: 3  Anesthesia Plan: General   Post-op Pain Management:    Induction: Intravenous  PONV Risk Score and Plan: 2 and Dexamethasone, Ondansetron and Treatment may vary due to age or medical condition  Airway Management Planned: LMA  Additional Equipment:   Intra-op Plan:   Post-operative Plan: Extubation in OR  Informed Consent: I have reviewed the patients History and Physical, chart, labs and discussed the procedure including the risks, benefits and alternatives for the proposed anesthesia with the patient or authorized representative who has indicated his/her understanding and acceptance.     Dental advisory given  Plan Discussed with:    Anesthesia Plan Comments: (Char reviewed, moved from [facility] to main [place] d/t medical complexity. -Finucane  Increasing frequency of syncopal episodes, not a candidate for PPM per cardiology- last seen 03/24/21. EKG with bradycardia and mobitz 1 at that time. Profound orthostatic hypotension with SBPs in 70s Saw his PCP 03/18/2021 and was found to be bradycardic with 2: 1 heart block and rates in the 30s. Will likely be very hypertensive in preop as BP meds have been scaled back to avoid orthostatic hypotension. Primarily bedbound over recent months.   Per cardiology: "The patient continues with orthostatic syncope, the most recent episode aggravated by efforts to treat systolic blood pressures already high at about 200.  However, he has had systolic drops in the Q000111Q with standing with a delta of about 80 mm.  Hence, I think systolic hypertension has to be accepted.  The issue will be how much can we tolerate and I suspect will have to be close to 200 mm.")       Anesthesia Quick Evaluation

## 2021-04-26 NOTE — Progress Notes (Signed)
Spoke with dr Benjamine Mola finucaine and patient needs to be done at [facility] due to medical compleity, left voice mail message for selita for pt to be moved to [facility]

## 2021-04-27 ENCOUNTER — Encounter (HOSPITAL_COMMUNITY)
Admission: RE | Admit: 2021-04-27 | Discharge: 2021-04-27 | Disposition: A | Discharge status: Home / Self Care | Payer: Medicare Other | Source: Ambulatory Visit | Attending: Urology | Admitting: Urology

## 2021-04-27 ENCOUNTER — Other Ambulatory Visit: Payer: Self-pay

## 2021-04-27 ENCOUNTER — Encounter (HOSPITAL_COMMUNITY): Payer: Self-pay

## 2021-04-27 DIAGNOSIS — Z01812 Encounter for preprocedural laboratory examination: Secondary | ICD-10-CM | POA: Insufficient documentation

## 2021-04-27 HISTORY — DX: Malignant neoplasm of prostate: C79.51

## 2021-04-27 HISTORY — DX: Malignant neoplasm of prostate: C61

## 2021-04-27 HISTORY — DX: Malignant (primary) neoplasm, unspecified: C80.1

## 2021-04-27 LAB — CBC
HCT: 40.7 % (ref 39.0–52.0)
Hemoglobin: 12.9 g/dL — ABNORMAL LOW (ref 13.0–17.0)
MCH: 28.3 pg (ref 26.0–34.0)
MCHC: 31.7 g/dL (ref 30.0–36.0)
MCV: 89.3 fL (ref 80.0–100.0)
Platelets: 259 10*3/uL (ref 150–400)
RBC: 4.56 MIL/uL (ref 4.22–5.81)
RDW: 14.8 % (ref 11.5–15.5)
WBC: 10.6 10*3/uL — ABNORMAL HIGH (ref 4.0–10.5)
nRBC: 0 % (ref 0.0–0.2)

## 2021-04-27 LAB — BASIC METABOLIC PANEL
Anion gap: 8 (ref 5–15)
BUN: 20 mg/dL (ref 8–23)
CO2: 26 mmol/L (ref 22–32)
Calcium: 9.1 mg/dL (ref 8.9–10.3)
Chloride: 101 mmol/L (ref 98–111)
Creatinine, Ser: 1.48 mg/dL — ABNORMAL HIGH (ref 0.61–1.24)
GFR, Estimated: 45 mL/min — ABNORMAL LOW (ref >60)
Glucose, Bld: 145 mg/dL — ABNORMAL HIGH (ref 70–99)
Potassium: 4.7 mmol/L (ref 3.5–5.1)
Sodium: 135 mmol/L (ref 135–145)

## 2021-04-27 NOTE — Progress Notes (Addendum)
COVID swab appointment:  N/A  COVID Vaccine Completed:  Yes x2 Date COVID Vaccine completed: Has received booster: COVID vaccine manufacturer: Pfizer      Date of COVID positive in last 90 days:  No  PCP - Shon Baton, MD Cardiologist - Jolyn Nap, MD.  Last appointment 03-24-21  Chest x-ray - 10-05-20 Epioc EKG - 03-24-21 Epic Stress Test - greater than 2 years ECHO - 10-06-20 Epic Cardiac Cath - greater than 2 years Pacemaker/ICD device last checked: Spinal Cord Stimulator:  Sleep Study - N/A CPAP -   Fasting Blood Sugar - N/A Checks Blood Sugar _____ times a day  Blood Thinner Instructions: Aspirin Instructions:  ASA 81 mg.  Last dose 04-27-21 Last Dose:  Activity level:  Unable to go up a flight of stairs due to instability, balance and weakness.  Patient able to feed self and ambulate independently.  Needs assistance with showering.        Anesthesia review:  Mobitz I, aortic regurgitation, tricuspid regurgitationHTN  Patient denies shortness of breath, fever, cough and chest pain at PAT appointment   Patient verbalized understanding of instructions that were given to them at the PAT appointment. Patient was also instructed that they will need to review over the PAT instructions again at home before surgery.

## 2021-04-27 NOTE — Patient Instructions (Addendum)
DUE TO COVID-19 ONLY ONE VISITOR IS ALLOWED TO COME WITH YOU AND STAY IN THE WAITING ROOM ONLY DURING PRE OP AND PROCEDURE.   **NO VISITORS ARE ALLOWED IN THE SHORT STAY AREA OR RECOVERY ROOM!!**        Your procedure is scheduled on:  Friday, 04-29-21   Report to [facility] Main  Entrance    Report to admitting at 12:15 PM   Call this number if you have problems the morning of surgery [phone]   Do not eat food :After Midnight.   May have liquids until 11:30 AM day of surgery  CLEAR LIQUID DIET  Foods Allowed                                                                     Foods Excluded  Water, Black Coffee (no milk/no creamer) and tea, regular and decaf                              liquids that you cannot  Plain Jell-O in any flavor  (No red)                         see through such as: Fruit ices (not with fruit pulp)                                 milk, soups, orange juice  Iced Popsicles (No red)                                    All solid food                             Apple juices Sports drinks like Gatorade (No red) Lightly seasoned clear broth or consume(fat free) Sugar      Oral Hygiene is also important to reduce your risk of infection.                                    Remember - BRUSH YOUR TEETH THE MORNING OF SURGERY WITH YOUR REGULAR TOOTHPASTE   Do NOT smoke after Midnight   Take these medicines the morning of surgery with A SIP OF WATER:  Acetaminophen                  Stop all vitamins and herbal supplements a week before surgery             You may not have any metal on your body including jewelry, and body piercing             Do not wear lotions, powders, cologne, or deodorant              Men may shave face and neck.  Do not bring valuables to the hospital. [place].   Contacts, dentures or bridgework may not be worn into surgery.  Patients discharged the day of surgery will not be  allowed to drive home.  Please read over the following fact sheets you were given: IF YOU HAVE QUESTIONS ABOUT YOUR PRE OP INSTRUCTIONS PLEASE CALL [facility] - Preparing for Surgery Before surgery, you can play an important role.  Because skin is not sterile, your skin needs to be as free of germs as possible.  You can reduce the number of germs on your skin by washing with CHG (chlorahexidine gluconate) soap before surgery.  CHG is an antiseptic cleaner which kills germs and bonds with the skin to continue killing germs even after washing. Please DO NOT use if you have an allergy to CHG or antibacterial soaps.  If your skin becomes reddened/irritated stop using the CHG and inform your nurse when you arrive at Short Stay. Do not shave (including legs and underarms) for at least 48 hours prior to the first CHG shower.  You may shave your face/neck.  Please follow these instructions carefully:  1.  Shower with CHG Soap the night before surgery and the  morning of surgery.  2.  If you choose to wash your hair, wash your hair first as usual with your normal  shampoo.  3.  After you shampoo, rinse your hair and body thoroughly to remove the shampoo.                             4.  Use CHG as you would any other liquid soap.  You can apply chg directly to the skin and wash.  Gently with a scrungie or clean washcloth.  5.  Apply the CHG Soap to your body ONLY FROM THE NECK DOWN.   Do   not use on face/ open                           Wound or open sores. Avoid contact with eyes, ears mouth and   genitals (private parts).                       Wash face,  Genitals (private parts) with your normal soap.             6.  Wash thoroughly, paying special attention to the area where your    surgery  will be performed.  7.  Thoroughly rinse your body with warm water from the neck down.  8.  DO NOT shower/wash with your normal soap after using and rinsing off the CHG Soap.                9.   Pat yourself dry with a clean towel.            10.  Wear clean pajamas.            11.  Place clean sheets on your bed the night of your first shower and do not  sleep with pets. Day of Surgery : Do not apply any lotions/deodorants the morning of surgery.  Please wear clean clothes to the hospital/surgery center.  FAILURE TO FOLLOW THESE INSTRUCTIONS MAY RESULT IN THE CANCELLATION OF YOUR SURGERY  PATIENT SIGNATURE_________________________________  NURSE SIGNATURE__________________________________  ________________________________________________________________________

## 2021-04-29 ENCOUNTER — Ambulatory Visit (HOSPITAL_COMMUNITY)
Admission: RE | Admit: 2021-04-29 | Discharge: 2021-04-29 | Disposition: A | Discharge status: Home / Self Care | Payer: Medicare Other | Source: Ambulatory Visit | Attending: Urology | Admitting: Urology

## 2021-04-29 ENCOUNTER — Ambulatory Visit (HOSPITAL_COMMUNITY): Payer: Medicare Other | Admitting: Anesthesiology

## 2021-04-29 ENCOUNTER — Ambulatory Visit (HOSPITAL_COMMUNITY): Payer: Medicare Other

## 2021-04-29 ENCOUNTER — Encounter (HOSPITAL_COMMUNITY)
Admission: RE | Disposition: A | Discharge status: Home / Self Care | Payer: Self-pay | Source: Ambulatory Visit | Attending: Urology

## 2021-04-29 ENCOUNTER — Encounter (HOSPITAL_COMMUNITY): Payer: Self-pay | Admitting: Urology

## 2021-04-29 DIAGNOSIS — Z8616 Personal history of COVID-19: Secondary | ICD-10-CM | POA: Insufficient documentation

## 2021-04-29 DIAGNOSIS — Z79899 Other long term (current) drug therapy: Secondary | ICD-10-CM | POA: Diagnosis not present

## 2021-04-29 DIAGNOSIS — C61 Malignant neoplasm of prostate: Secondary | ICD-10-CM | POA: Insufficient documentation

## 2021-04-29 DIAGNOSIS — Z91030 Bee allergy status: Secondary | ICD-10-CM | POA: Diagnosis not present

## 2021-04-29 DIAGNOSIS — N131 Hydronephrosis with ureteral stricture, not elsewhere classified: Secondary | ICD-10-CM | POA: Diagnosis not present

## 2021-04-29 DIAGNOSIS — C7951 Secondary malignant neoplasm of bone: Secondary | ICD-10-CM | POA: Diagnosis not present

## 2021-04-29 DIAGNOSIS — Z466 Encounter for fitting and adjustment of urinary device: Secondary | ICD-10-CM | POA: Diagnosis not present

## 2021-04-29 DIAGNOSIS — N135 Crossing vessel and stricture of ureter without hydronephrosis: Secondary | ICD-10-CM | POA: Diagnosis present

## 2021-04-29 HISTORY — PX: CYSTOSCOPY W/ URETERAL STENT PLACEMENT: SHX1429

## 2021-04-29 SURGERY — CYSTOSCOPY, WITH RETROGRADE PYELOGRAM AND URETERAL STENT INSERTION
Anesthesia: General | Site: Ureter | Laterality: Left

## 2021-04-29 MED ORDER — DEXAMETHASONE SODIUM PHOSPHATE 10 MG/ML IJ SOLN
INTRAMUSCULAR | Status: DC | PRN
  Administered 2021-04-29: 4 mg via INTRAVENOUS

## 2021-04-29 MED ORDER — IOHEXOL 300 MG/ML  SOLN
INTRAMUSCULAR | Status: DC | PRN
  Administered 2021-04-29: 9 mL via URETHRAL

## 2021-04-29 MED ORDER — PHENYLEPHRINE HCL (PRESSORS) 10 MG/ML IV SOLN
INTRAVENOUS | Status: DC | PRN
  Administered 2021-04-29: 80 ug via INTRAVENOUS

## 2021-04-29 MED ORDER — LACTATED RINGERS IV SOLN: INTRAVENOUS | Status: DC

## 2021-04-29 MED ORDER — PROPOFOL 10 MG/ML IV BOLUS
INTRAVENOUS | Status: DC | PRN
  Administered 2021-04-29: 70 mg via INTRAVENOUS

## 2021-04-29 MED ORDER — PHENYLEPHRINE HCL (PRESSORS) 10 MG/ML IV SOLN
INTRAVENOUS | Status: AC
  Filled 2021-04-29: qty 2

## 2021-04-29 MED ORDER — CHLORHEXIDINE GLUCONATE 0.12 % MT SOLN
15.0000 mL | Freq: Once | OROMUCOSAL | Status: AC
  Administered 2021-04-29: 15 mL via OROMUCOSAL

## 2021-04-29 MED ORDER — FENTANYL CITRATE (PF) 100 MCG/2ML IJ SOLN
INTRAMUSCULAR | Status: AC
  Filled 2021-04-29: qty 2

## 2021-04-29 MED ORDER — LIDOCAINE 2% (20 MG/ML) 5 ML SYRINGE
INTRAMUSCULAR | Status: DC | PRN
  Administered 2021-04-29: 60 mg via INTRAVENOUS

## 2021-04-29 MED ORDER — ONDANSETRON HCL 4 MG/2ML IJ SOLN
INTRAMUSCULAR | Status: DC | PRN
  Administered 2021-04-29: 4 mg via INTRAVENOUS

## 2021-04-29 MED ORDER — ORAL CARE MOUTH RINSE: 15.0000 mL | Freq: Once | OROMUCOSAL | Status: AC

## 2021-04-29 MED ORDER — FENTANYL CITRATE (PF) 100 MCG/2ML IJ SOLN
INTRAMUSCULAR | Status: DC | PRN
  Administered 2021-04-29: 25 ug via INTRAVENOUS

## 2021-04-29 MED ORDER — 0.9 % SODIUM CHLORIDE (POUR BTL) OPTIME
TOPICAL | Status: DC | PRN
  Administered 2021-04-29: 1000 mL

## 2021-04-29 MED ORDER — AMISULPRIDE (ANTIEMETIC) 5 MG/2ML IV SOLN: 10.0000 mg | Freq: Once | INTRAVENOUS | Status: DC | PRN

## 2021-04-29 MED ORDER — SODIUM CHLORIDE 0.9 % IR SOLN
Status: DC | PRN
  Administered 2021-04-29: 3000 mL via INTRAVESICAL

## 2021-04-29 MED ORDER — FENTANYL CITRATE PF 50 MCG/ML IJ SOSY: 25.0000 ug | PREFILLED_SYRINGE | INTRAMUSCULAR | Status: DC | PRN

## 2021-04-29 MED ORDER — CEFAZOLIN SODIUM-DEXTROSE 2-4 GM/100ML-% IV SOLN
2.0000 g | INTRAVENOUS | Status: AC
  Administered 2021-04-29: 2 g via INTRAVENOUS
  Filled 2021-04-29: qty 100

## 2021-04-29 SURGICAL SUPPLY — 16 items
BAG URO CATCHER STRL LF (MISCELLANEOUS) ×2 IMPLANT
BASKET ZERO TIP NITINOL 2.4FR (BASKET) IMPLANT
BSKT STON RTRVL ZERO TP 2.4FR (BASKET)
CATH INTERMIT  6FR 70CM (CATHETERS) ×2 IMPLANT
CLOTH BEACON ORANGE TIMEOUT ST (SAFETY) ×2 IMPLANT
GLOVE SURG ENC TEXT LTX SZ7.5 (GLOVE) ×2 IMPLANT
GOWN STRL REUS W/TWL LRG LVL3 (GOWN DISPOSABLE) ×3 IMPLANT
GUIDEWIRE ANG ZIPWIRE 038X150 (WIRE) ×2 IMPLANT
GUIDEWIRE STR DUAL SENSOR (WIRE) IMPLANT
IV NS IRRIG 3000ML ARTHROMATIC (IV SOLUTION) ×1 IMPLANT
KIT TURNOVER KIT A (KITS) ×2 IMPLANT
MANIFOLD NEPTUNE II (INSTRUMENTS) ×2 IMPLANT
PACK CYSTO (CUSTOM PROCEDURE TRAY) ×2 IMPLANT
STENT CONTOUR 7FRX26X.038 (STENTS) ×2 IMPLANT
TUBING CONNECTING 10 (TUBING) ×2 IMPLANT
TUBING UROLOGY SET (TUBING) IMPLANT

## 2021-04-29 NOTE — Transfer of Care (Signed)
Immediate Anesthesia Transfer of Care Note  Patient: Steven Cain  Procedure(s) Performed: CYSTOSCOPY WITH RETROGRADE PYELOGRAM/URETERAL STENT EXCHANGE (Left: Ureter)  Patient Location: PACU  Anesthesia Type:General  Level of Consciousness: awake and patient cooperative  Airway & Oxygen Therapy: Patient Spontanous Breathing and Patient connected to face mask oxygen  Post-op Assessment: Report given to RN and Post -op Vital signs reviewed and stable  Post vital signs: Reviewed and stable  Last Vitals:  Vitals Value Taken Time  BP 220/105 04/29/21 1455  Temp 36.7 C 04/29/21 1455  Pulse 72 04/29/21 1459  Resp 13 04/29/21 1459  SpO2 100 % 04/29/21 1459  Vitals shown include unvalidated device data.  Last Pain:  Vitals:   04/29/21 1323  TempSrc:   PainSc: 0-No pain         Complications: No notable events documented.

## 2021-04-29 NOTE — H&P (Signed)
Steven Cain is an 87 y.o. male.    Chief Complaint: Pre-Op LEFT Ureteral Stent Exchange  HPI:   1 - Obstructing Left Ureteral Stricture - worrisome soft tissue thickening of distal left ureter by CT and operative retrograde pyelogram 05/06/14. Now s/p stenting. No bladder lesion or additional lesions by imaging. He has h/o extensive smokeless tobacco exposure. He is NOT candidate for definitive repair.   Recent Course:  05/2014 - Left Ureteroscopy / biopsy / brushing / washing / stent change (6x26)- path just mild atypia only, no frank carcinoma,  01/2019 - Stent exchange 7x26 contour  01/2020 - Stent exchange 7x26 contour   2 - Incontinence - Long h/o near total incontinence s/p TURP previously by Dr. Rosana Hoes. Manages with adult diapers x years.   3 - Metastatic Prostate Cancer - 12/12 cores up to 100% grade 5 cancer by BX 05/2020 at age 35 on eval PSA 21. TRUS 26 mL, modest median lobe. CT 02/2020 (done for appendicitis) with ? T 11 sclerotic lesion, no pelvic adenopathy, bone lesion is small. BS with probable bilateral humererus, rib, axial mets 06/2020. TRUS BX 04/2020 26 mL, modest median lobe.   Most Recetn Restaging: CT, BS 06/2020 - bilateral humerus mets.  Prior Therapy - TURP  Current Therapy - continuous androgen deprivation, Xtandi   Recent Course:  05/2020 - Eligard 45  08/2020 - PSA 14.1, T <10, CMP Nl, Cr 1.6; 12/2020 - PSA 19.3, T<10, CMP Nl, Cr 1.0 ==> Eligard 45 + Start Xtandi; 03/2021 - PSA 37, T <10, CMP Nl    PMH sig for Billroth II procedure R+Y, bilateral inguinal hernia repair with mesh, alcoholism (now very involved and compliant with AA) His PCP is Dr. Virgina Jock. 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 His wife help in most ADL's and VERY involved with all decisions.   Today [name] is seen to proceed with LEFT ureteral stent exchage. No interval fevers.   Past Medical History:  Diagnosis Date   Abnormal EKG    hx of left anterior  fasicular block on ekg, no cardiologist   Anxiety    AR (aortic regurgitation) 03/23/2017   Mild, Noted on ECHO   Arthritis    DJD   Benign enlargement of prostate    frequent UTI, trim of prostate done , with some issues of incontinence now.   Cancer ([place])    Chronic bronchitis ([place])    chronic- usually has phelgm often   COVID-19 08/2019   fatigue and loss of taste and smell x 10 days did monoclonal antibody therapy   Depression    Dyspnea    with excertion   Fluid retention in legs    resolved   GERD (gastroesophageal reflux disease)    rare   GI bleed    Grade I diastolic dysfunction    Hard of hearing    hear more on left side- no hearing aid   History of colon polyps    History of transfusion 2016   Hydronephrosis 2018   Left ckd stage 3 no nephrologist per wife adele   Hypertension    Moderate concentric left ventricular hypertrophy 03/23/2017   Noted on ECHO   Perforated, severe stomach ulcer ([place]) 1974   Peripheral neuropathy    feet and toes   Prostate cancer metastatic to bone [facility])    Spinal stenosis    TR (tricuspid regurgitation) 03/23/2017   Mild, Noted on ECHO   Transfusion history  none recent   Ureteral stricture    CHRONIC   Urinary incontinence    UTI (urinary tract infection)    frequent   Wears glasses     Past Surgical History:  Procedure Laterality Date   ABDOMINAL SURGERY     bilroths tube '74   COLONOSCOPY N/A 09/29/2014   Procedure: COLONOSCOPY;  Surgeon: Beryle Beams, MD;  Location: [facility];  Service: Endoscopy;  Laterality: N/A;   CYSTOSCOPY W/ URETERAL STENT PLACEMENT Left 05/06/2014   Procedure: CYSTOSCOPY WITH RETROGRADE PYELOGRAM/URETERAL STENT PLACEMENT;  Surgeon: [name], MD;  Location: [facility];  Service: Urology;  Laterality: Left;   CYSTOSCOPY W/ URETERAL STENT PLACEMENT Left 03/31/2015   Procedure: CYSTOSCOPY WITH LEFT RETROGRADE PYELOGRAM/URETERAL STENT EXCHANGE;  Surgeon: [name], MD;  Location: [facility];  Service: Urology;  Laterality: Left;   CYSTOSCOPY W/ URETERAL STENT PLACEMENT Left 09/15/2015   Procedure: CYSTOSCOPY WITH LEFT RETROGRADE PYELOGRAM/URETERAL STENT EXCHANGE ;  Surgeon: [name], MD;  Location: [facility];  Service: Urology;  Laterality: Left;   CYSTOSCOPY W/ URETERAL STENT PLACEMENT Left 04/28/2016   Procedure: CYSTOSCOPY WITH RETROGRADE PYELOGRAM/URETERAL STENT EXCHANGE;  Surgeon: [name], MD;  [facility];  Service: Urology;  Laterality: Left;   CYSTOSCOPY W/ URETERAL STENT PLACEMENT Left 11/08/2016   Procedure: CYSTOSCOPY WITH RETROGRADE PYELOGRAM/URETERAL STENT EXCHANGE;  Surgeon: [name], MD;  Location[facility];  Service: Urology;  Laterality: Left;   CYSTOSCOPY W/ URETERAL STENT PLACEMENT Left 09/14/2017   Procedure: CYSTOSCOPY WITH RETROGRADE PYELOGRAM/URETERAL STENT PLACEMENT;  Surgeon: [name], MD;  [facility];  Service: Urology;  Laterality: Left;   CYSTOSCOPY W/ URETERAL STENT PLACEMENT Left 04/10/2018   Procedure: CYSTOSCOPY WITH LEFT RETROGRADE URETERAL STENT EXCHANGE;  Surgeon: [name], MD;  Location: [facility];  Service: Urology;  Laterality: Left;   CYSTOSCOPY W/ URETERAL STENT PLACEMENT Left 01/31/2019   Procedure: CYSTOSCOPY WITH RETROGRADE PYELOGRAM/URETERAL STENT PLACEMENT;  Surgeon: [name], MD;  Location[facility];  Service: Urology;  Laterality: Left;  [place] PLACEMENT Left 01/16/2020   Procedure: CYSTOSCOPY WITH RETROGRADE PYELOGRAM/URETERAL STENT EXCHANGED;  Surgeon: [name], MD;  Location: [facility];  Service: Urology;  Laterality: Left;   CYSTOSCOPY WITH RETROGRADE PYELOGRAM, URETEROSCOPY AND STENT PLACEMENT Left 10/21/2014   Procedure: CYSTOSCOPY WITH RETROGRADE PYELOGRAM, URETEROSCOPY,BIOPSY AND STENT CHANGE;  Surgeon: [name], MD;  [facility];  Service: Urology;  Laterality: Left;   CYSTOSCOPY/RETROGRADE/URETEROSCOPY Left  06/03/2014   Procedure: CYSTOSCOPY/RETROGRADE/URETEROSCOPY WITH BIOPSY,STENT EXCHANGE;  Surgeon: [name], MD;  [facility];  Service: Urology;  Laterality: Left;   ESOPHAGOGASTRODUODENOSCOPY N/A 09/28/2014   Procedure: ESOPHAGOGASTRODUODENOSCOPY (EGD);  Surgeon: Beryle Beams, MD;  Location: [facility];  Service: Endoscopy;  Laterality: N/A;   GASTRIC ROUX-EN-Y     '74   HERNIA REPAIR     bilateral hernia repairs    IR ANGIO INTRA EXTRACRAN SEL COM CAROTID INNOMINATE BILAT MOD SED  01/20/2021   IR ANGIO VERTEBRAL SEL SUBCLAVIAN INNOMINATE BILAT MOD SED  01/20/2021   IR US GUIDE VASC ACCESS RIGHT  01/20/2021   LAPAROSCOPIC APPENDECTOMY N/A 02/15/2020   Procedure: APPENDECTOMY LAPAROSCOPIC;  Surgeon: Jovita Kussmaul, MD;  [facility];  Service: General;  Laterality: N/A;   TONSILLECTOMY  as child   TOTAL HIP ARTHROPLASTY Left 05/19/2016   Procedure: LEFT TOTAL HIP ARTHROPLASTY ANTERIOR APPROACH;  Surgeon: Mcarthur Rossetti, MD;  Location: [facility];  Service: Orthopedics;  Laterality:  Left;   TRANSURETHRAL RESECTION OF PROSTATE     VAGOTOMY  1950's    Family History  Problem Relation Age of Onset   Heart disease Neg Hx    Social History:  reports that he has quit smoking. His smoking use included cigarettes. He has quit using smokeless tobacco.  His smokeless tobacco use included chew. He reports that he does not currently use alcohol. He reports that he does not use drugs.  Allergies:  Allergies  Allergen Reactions   Bee Venom Swelling    No medications prior to admission.    No results found for this or any previous visit (from the past 48 hour(s)). No results found.  Review of Systems  Constitutional:  Negative for chills and fever.  All other systems reviewed and are negative.  There were no vitals taken for this visit. Physical Exam Vitals reviewed.  Constitutional:      Comments: Stable stigmata of moderate dementia. Pleasant. At baseline.   HENT:      Head: Normocephalic.     Nose: Nose normal.  Eyes:     Pupils: Pupils are equal, round, and reactive to light.  Cardiovascular:     Rate and Rhythm: Normal rate.  Abdominal:     General: Abdomen is flat.  Musculoskeletal:        General: Normal range of motion.     Cervical back: Normal range of motion.  Skin:    General: Skin is warm.  Neurological:     General: No focal deficit present.     Mental Status: He is alert.  Psychiatric:        Mood and Affect: Mood normal.     Assessment/Plan  Proceed as planned with cysto, LEFT retrograde / ureteral stent exchange. Risks, benefits, alternatives, expected peri-op course discussed previously and reiterated today to pt and wife.   [name], MD 04/29/2021, 10:41 AM

## 2021-04-29 NOTE — Anesthesia Procedure Notes (Addendum)
Procedure Name: LMA Insertion Date/Time: 04/29/2021 2:25 PM Performed by: West Pugh, CRNA Pre-anesthesia Checklist: Patient identified, Emergency Drugs available, Suction available, Patient being monitored and Timeout performed Patient Re-evaluated:Patient Re-evaluated prior to induction Oxygen Delivery Method: Circle system utilized Preoxygenation: Pre-oxygenation with 100% oxygen Induction Type: IV induction LMA: LMA inserted LMA Size: 4.0 Number of attempts: 1 Placement Confirmation: positive ETCO2 Tube secured with: Tape Dental Injury: Teeth and Oropharynx as per pre-operative assessment

## 2021-04-29 NOTE — Op Note (Signed)
Steven Cain, ORD MEDICAL RECORD NO: [id number] ACCOUNT NO: [id number] DATE OF BIRTH: 1930-10-25 FACILITY: [facility] LOCATION: [facility] PHYSICIAN: [name], MD  Operative Report   DATE OF PROCEDURE: 04/29/2021  PREOPERATIVE DIAGNOSES:  Chronic left ureteral stricture, metastatic prostate cancer.  PROCEDURES: 1.  Cystoscopy with left retrograde pyelogram and interpretation. 2.  Exchange of left ureteral stent, 7 x 26 Contour, no tether.  ESTIMATED BLOOD LOSS: Nil.  COMPLICATIONS:  None.  SPECIMEN:  Left ureteral stent discarded, inspected and intact.  FINDINGS:    1.  Persistent moderate left hydronephrosis. 2.  Successful replacement of left ureteral stent, proximal end in renal pelvis, distal end in urinary bladder. 3.  Minimal encrustation of in-situ stent.  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  He does have a chronic left ureteral stricture, managed with stenting that he tolerates  quite well.  He encrusts stents very slowly and has tolerated stent interval changes as prolonged as 1 year. It has been approximately 15 months since most recent stent change, and he presents for this today.  Informed consent was obtained and placed in  medical record.  DESCRIPTION OF PROCEDURE:  [name] verified.  Procedure being left ureteral stent exchange was confirmed.  Procedure timeout was performed.  Intravenous antibiotics were administered.  General LMA anesthesia was induced.  The  patient was placed into a low lithotomy position.  Sterile field was created prepping and draping the patient's penis, perineum, and proximal thighs using iodine.  Cystourethroscopy was performed using 21-French rigid cystoscope with offset lens.  The  patient does have some mild distal hypospadias.  This is stable.  Anterior and posterior urethra unremarkable.   Inspection of urinary bladder revealed distal end of left ureteral stent in situ.  There was minimal encrustation.  It was grasped, brought to  the level of the urethral meatus.  A 0.038 ZIPwire was advanced to the level of the kidney.  The stent was exchanged for an open-ended catheter, and left retrograde pyelogram was obtained.  Left retrograde pyelogram demonstrated a single left ureter, single system left kidney.  There was mild tortuosity and moderate hydronephrosis that was stable consistent with known stricture.  ZIPwire was again advanced to the lower pole, and a new 7 x  26 Contour type stent was placed using fluoroscopic guidance.  Good proximal and distal planes were noted.  The procedure was terminated.  The patient tolerated the procedure well.  No immediate periprocedural complications.  The patient was taken to  postanesthesia care unit in stable condition.  Plan for discharge home.  Notably, the removed stent was inspected and intact.   ROH D: 04/29/2021 2:46:31 pm T: 04/29/2021 11:31:00 pm  JOB: [id number]

## 2021-04-29 NOTE — Discharge Instructions (Signed)
1 - You may have urinary urgency (bladder spasms) and bloody urine on / off with stent in place. This is normal. ° °2 - Call MD or go to ER for fever >102, severe pain / nausea / vomiting not relieved by medications, or acute change in medical status ° °

## 2021-04-29 NOTE — Brief Op Note (Signed)
04/29/2021  2:42 PM  PATIENT:  Steven Cain  87 y.o. male  PRE-OPERATIVE DIAGNOSIS:  LEFT URETERAL STRICTURE  POST-OPERATIVE DIAGNOSIS:  LEFT URETERAL STRICTURE  PROCEDURE:  Procedure(s): CYSTOSCOPY WITH RETROGRADE PYELOGRAM/URETERAL STENT EXCHANGE (Left)  SURGEON:  Surgeon(s) and Role:    * [name], MD - Primary  PHYSICIAN ASSISTANT:   ASSISTANTS: none   ANESTHESIA:   general  EBL: minimal  BLOOD ADMINISTERED:none  DRAINS: none   LOCAL MEDICATIONS USED:  NONE  SPECIMEN:  Source of Specimen:  left ureteral stent  DISPOSITION OF SPECIMEN:   discard  COUNTS:  YES  TOURNIQUET:  * No tourniquets in log *  DICTATION: .Other Dictation: Dictation Number [id number]  PLAN OF CARE: Discharge to home after PACU  PATIENT DISPOSITION:  PACU - hemodynamically stable.   Delay start of Pharmacological VTE agent (>24hrs) due to surgical blood loss or risk of bleeding: yes

## 2021-04-30 ENCOUNTER — Encounter (HOSPITAL_COMMUNITY): Payer: Self-pay | Admitting: Urology

## 2021-05-02 NOTE — Anesthesia Postprocedure Evaluation (Signed)
Anesthesia Post Note  Patient: Steven Cain  Procedure(s) Performed: CYSTOSCOPY WITH RETROGRADE PYELOGRAM/URETERAL STENT EXCHANGE (Left: Ureter)     Patient location during evaluation: PACU Anesthesia Type: General Level of consciousness: awake and alert Pain management: pain level controlled Vital Signs Assessment: post-procedure vital signs reviewed and stable Respiratory status: spontaneous breathing, nonlabored ventilation, respiratory function stable and patient connected to nasal cannula oxygen Cardiovascular status: blood pressure returned to baseline and stable Postop Assessment: no apparent nausea or vomiting Anesthetic complications: no   No notable events documented.  Last Vitals:  Vitals:   04/29/21 1530 04/29/21 1540  BP: (!) 224/86 (!) 230/88  Pulse: 69 72  Resp: 20 18  Temp: 36.9 C 36.9 C  SpO2: 95% 94%    Last Pain:  Vitals:   04/29/21 1540  TempSrc:   PainSc: 0-No pain                 Tiajuana Amass

## 2021-05-10 DIAGNOSIS — Z23 Encounter for immunization: Secondary | ICD-10-CM | POA: Diagnosis not present

## 2021-05-10 DIAGNOSIS — I129 Hypertensive chronic kidney disease with stage 1 through stage 4 chronic kidney disease, or unspecified chronic kidney disease: Secondary | ICD-10-CM | POA: Diagnosis not present

## 2021-05-10 DIAGNOSIS — R001 Bradycardia, unspecified: Secondary | ICD-10-CM | POA: Diagnosis not present

## 2021-05-11 DIAGNOSIS — C61 Malignant neoplasm of prostate: Secondary | ICD-10-CM | POA: Diagnosis not present

## 2021-05-11 DIAGNOSIS — C7951 Secondary malignant neoplasm of bone: Secondary | ICD-10-CM | POA: Diagnosis not present

## 2021-05-19 DIAGNOSIS — J42 Unspecified chronic bronchitis: Secondary | ICD-10-CM | POA: Diagnosis not present

## 2021-05-19 DIAGNOSIS — N1832 Chronic kidney disease, stage 3b: Secondary | ICD-10-CM | POA: Diagnosis not present

## 2021-05-19 DIAGNOSIS — R55 Syncope and collapse: Secondary | ICD-10-CM | POA: Diagnosis not present

## 2021-05-19 DIAGNOSIS — I951 Orthostatic hypotension: Secondary | ICD-10-CM | POA: Diagnosis not present

## 2021-05-19 DIAGNOSIS — E441 Mild protein-calorie malnutrition: Secondary | ICD-10-CM | POA: Diagnosis not present

## 2021-05-19 DIAGNOSIS — I129 Hypertensive chronic kidney disease with stage 1 through stage 4 chronic kidney disease, or unspecified chronic kidney disease: Secondary | ICD-10-CM | POA: Diagnosis not present

## 2021-05-19 DIAGNOSIS — I441 Atrioventricular block, second degree: Secondary | ICD-10-CM | POA: Diagnosis not present

## 2021-05-19 DIAGNOSIS — C7951 Secondary malignant neoplasm of bone: Secondary | ICD-10-CM | POA: Diagnosis not present

## 2021-05-19 DIAGNOSIS — I700 Atherosclerosis of aorta: Secondary | ICD-10-CM | POA: Diagnosis not present

## 2021-05-19 DIAGNOSIS — R001 Bradycardia, unspecified: Secondary | ICD-10-CM | POA: Diagnosis not present

## 2021-05-19 DIAGNOSIS — C61 Malignant neoplasm of prostate: Secondary | ICD-10-CM | POA: Diagnosis not present

## 2021-05-19 DIAGNOSIS — D692 Other nonthrombocytopenic purpura: Secondary | ICD-10-CM | POA: Diagnosis not present

## 2021-06-07 DIAGNOSIS — Z23 Encounter for immunization: Secondary | ICD-10-CM | POA: Diagnosis not present

## 2021-06-14 DIAGNOSIS — C61 Malignant neoplasm of prostate: Secondary | ICD-10-CM | POA: Diagnosis not present

## 2021-06-14 DIAGNOSIS — C7951 Secondary malignant neoplasm of bone: Secondary | ICD-10-CM | POA: Diagnosis not present

## 2021-07-13 DIAGNOSIS — M1990 Unspecified osteoarthritis, unspecified site: Secondary | ICD-10-CM | POA: Diagnosis not present

## 2021-07-13 DIAGNOSIS — I129 Hypertensive chronic kidney disease with stage 1 through stage 4 chronic kidney disease, or unspecified chronic kidney disease: Secondary | ICD-10-CM | POA: Diagnosis not present

## 2021-07-13 DIAGNOSIS — N1832 Chronic kidney disease, stage 3b: Secondary | ICD-10-CM | POA: Diagnosis not present

## 2021-07-20 DIAGNOSIS — C61 Malignant neoplasm of prostate: Secondary | ICD-10-CM | POA: Diagnosis not present

## 2021-07-20 DIAGNOSIS — C7951 Secondary malignant neoplasm of bone: Secondary | ICD-10-CM | POA: Diagnosis not present

## 2021-07-28 DIAGNOSIS — N3941 Urge incontinence: Secondary | ICD-10-CM | POA: Diagnosis not present

## 2021-07-28 DIAGNOSIS — N135 Crossing vessel and stricture of ureter without hydronephrosis: Secondary | ICD-10-CM | POA: Diagnosis not present

## 2021-07-28 DIAGNOSIS — C61 Malignant neoplasm of prostate: Secondary | ICD-10-CM | POA: Diagnosis not present

## 2021-07-28 DIAGNOSIS — C7951 Secondary malignant neoplasm of bone: Secondary | ICD-10-CM | POA: Diagnosis not present

## 2021-08-09 ENCOUNTER — Ambulatory Visit: Payer: Medicare Other | Admitting: Internal Medicine

## 2021-08-18 DIAGNOSIS — C7951 Secondary malignant neoplasm of bone: Secondary | ICD-10-CM | POA: Diagnosis not present

## 2021-08-18 DIAGNOSIS — C61 Malignant neoplasm of prostate: Secondary | ICD-10-CM | POA: Diagnosis not present

## 2021-08-22 ENCOUNTER — Ambulatory Visit (INDEPENDENT_AMBULATORY_CARE_PROVIDER_SITE_OTHER): Payer: Medicare Other | Admitting: Neurology

## 2021-08-22 ENCOUNTER — Encounter: Payer: Self-pay | Admitting: Neurology

## 2021-08-22 ENCOUNTER — Other Ambulatory Visit: Payer: Self-pay

## 2021-08-22 VITALS — BP 140/80 | HR 81

## 2021-08-22 DIAGNOSIS — R2681 Unsteadiness on feet: Secondary | ICD-10-CM

## 2021-08-22 DIAGNOSIS — W19XXXD Unspecified fall, subsequent encounter: Secondary | ICD-10-CM

## 2021-08-22 NOTE — Patient Instructions (Signed)
I had a long discussion with patient and his wife regarding his syncopal episodes gait instability which appears now stable..  I recommend he continue aspirin 81 mg daily and maintain aggressive risk factor modification with strict control of hypertension blood pressure goal below 130/90, lipids with LDL cholesterol goal below 70 mg percent and diabetes with hemoglobin A1c goal below 6.5%.  Continue medical management for his asymptomatic vertebral artery stenosis for now . He will return for f/u in 1 year or call earlier if needed.I also advised him to drink enough fluids and do orthostatic tolerance exercises before getting up or standing.

## 2021-08-22 NOTE — Progress Notes (Signed)
[facility] [address] [place] [address] [phone]       OFFICE FOLLOW UP VISIT NOTE  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  Is accompanied by his wife.  History is obtained from them and review of electronic medical records and I personally reviewed available imaging films in PACS.  He has a past medical history of prostate cancer with bony mets currently on testosterone antagonist treatment, grade 1 diastolic dysfunction, hypertension, chronic kidney disease stage III, remote history of seizures in the past in setting of alcohol use or withdrawal with last seizure being 37 years ago and not been on long-term antiepileptics.  He presented to [facility] on 10/06/2020 with an unwitnessed episode.  His wife heard a loud noise and the patient went to the bathroom and was found on the floor unresponsive.  He was gurgling at the mouth with thick phlegm in his throat and appeared to have passed out.  There was a question of momentary loss of pulses.  The daughter on arrival of the nurse noted some tonic-clonic activity.  EMS was called and in route noticed patient had bradycardia and cardiology was consulted after admission his heart rate remained in the low 30s.  He was noted to be into his 2 1 AV block.  MRI scan of the brain was obtained which was unremarkable for any acute abnormalities.  Radiologist on call questioned DWI changes on the cortex on the right concerning for seizure but in my opinion these are likely artifact and neuroradiologist also concurred.  EEG was normal.  Patient apparently has had somewhat similar episode in the past.  Patient stated that this time he remembers  that he had still not been feeling well and discomfort he had eaten something sweet which usually causes him to have a gastric dumping syndrome.  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  His not had any seizures for more than 30 years.  He does have longstanding history of psychiatric disorders including treatment with the ECTs for severe depression in the past.  He has had Roux and Y gastric bypass surgery and does have history of gastric dumping syndrome particularly when he eats certain sweet foods.  Wife is aware of at least 1 more episode of syncope in the past when he became all of a sudden sweaty with sick feeling to his stomach and face and skin went gray and he passed out briefly.  Patient does have longstanding history of gait and balance problems and does admit to feeling dizzy and lightheaded when he extends his neck or turns head quickly to one side.  He also has a tendency to form backwards.  He has not had any recent neurovascular imaging done.  He had an appointment with Dr. Caryl Comes electrophysiologist later on 10/13/2020 who felt patient had orthostatic hypotension causing his syncope as he documented a 70 point systolic drop.  Appeared much improved with compressive wear started in the hospital and abdominal binder and thigh sleeves.  He had echocardiogram on 08/05/2021 which showed ejection fraction of 55 to 60% with no regional wall motion abnormalities. Update 02/08/2021:  He returns for follow-up after last visit with me to the end of months ago.  Is accompanied by his wife.  Patient states she is done well has had no stroke or TIA symptoms.  He underwent EEG on 12/15/2020 which was normal.  Previous EEG on 10/06/2020 was also normal.  CT angiogram of the brain and neck on 12/02/2020 had shown shown multifocal intracranial stenosis with moderate left cavernous carotid and mild distal right M2 middle cerebral artery stenosis as  well as severe stenosis of distal right V4 vertebral artery.  Severe stenosis of the P2 segment of the right posterior cerebral artery.  CT angiogram of the neck had shown moderate to severe's narrowing of the origin of the right vertebral and mild narrowing at the origin of the left vertebral artery.  He subsequently underwent diagnostic cerebral catheter angiogram by Dr. Manson Passey on 01/20/2021 which showed only 50% stenosis origin of the right vertebral and 60 to 70% stenosis origin of left vertebral artery and 60% stenosis of distal V4 segment of the right vertebral artery and short segment severe stenosis proximal left posterior cerebral artery.  Patient was recommended aggressive medical therapy and no intervention was indicated at the present time.  Patient states is done well since discharge has had no new complaints.  He is tolerating aspirin well without any side effects.  He does use his walker to ambulate for short distances.  He uses wheelchair for long distances.  He states his balance is poor but he has had no falls or injuries. Update 08/22/2021 : Patient returns for follow-up after last visit 6 months ago.  Is accompanied by his wife.  He states he is doing well.  Continues to have significant problems with his balance and he barely walks a few feet in his bedroom from his bed to the bathroom.  He uses wheelchair for long distances.  He has had no falls or injuries.  He has had no further episodes of syncope or near syncope either.  He remains on aspirin which is tolerating well without bleeding or bruising.  His blood pressure is usually under good control though it is slightly elevated today.  He has no new complaints. ROS:   14 system review of systems is positive for syncope, jerking, loss of consciousness, gait difficulty, balance problems, neck pain increased fall risk and all other systems negative  PMH:  Past Medical History:  Diagnosis Date   Abnormal EKG    hx of left  anterior fasicular block on ekg, no cardiologist   Anxiety    AR (aortic regurgitation) 03/23/2017   Mild, Noted on ECHO   Arthritis    DJD   Benign enlargement of prostate    frequent UTI, trim of prostate done , with some issues of incontinence now.   Cancer ([place])    Chronic bronchitis ([place])    chronic- usually has phelgm often   COVID-19 08/2019   fatigue and loss of taste and smell x 10 days did monoclonal antibody therapy   Depression    Dyspnea    with excertion   Fluid retention in legs    resolved   GERD (gastroesophageal reflux disease)    rare   GI bleed    Grade I diastolic dysfunction    Hard of hearing    hear more on left side- no hearing aid   History of colon polyps    History of transfusion 2016   Hydronephrosis 2018   Left ckd  stage 3 no nephrologist per wife adele   Hypertension    Moderate concentric left ventricular hypertrophy 03/23/2017   Noted on ECHO   Perforated, severe stomach ulcer ([place]) 1974   Peripheral neuropathy    feet and toes   Prostate cancer metastatic to bone (HCC)    Spinal stenosis    TR (tricuspid regurgitation) 03/23/2017   Mild, Noted on ECHO   Transfusion history    none recent   Ureteral stricture    CHRONIC   Urinary incontinence    UTI (urinary tract infection)    frequent   Wears glasses     Social History:  Social History   Socioeconomic History   Marital status: Married    Spouse name: Not on file   Number of children: Not on file   Years of education: Not on file   Highest education level: Not on file  Occupational History   Not on file  Tobacco Use   Smoking status: Former    Types: Cigarettes   Smokeless tobacco: Former    Types: Chew   Tobacco comments:    quit yrs ago  Scientific laboratory technician Use: Never used  Substance and Sexual Activity   Alcohol use: Not Currently   Drug use: No   Sexual activity: Not Currently  Other Topics Concern   Not on file  Social History Narrative   Lives with wife  and grandchildren   Right Handed   Drink 1-2 cups caffeine daily   Social Determinants of Health   Financial Resource Strain: Not on file  Food Insecurity: Not on file  Transportation Needs: Not on file  Physical Activity: Not on file  Stress: Not on file  Social Connections: Not on file  Intimate Partner Violence: Not on file    Medications:   Current Outpatient Medications on File Prior to Visit  Medication Sig Dispense Refill   acetaminophen (TYLENOL) 500 MG tablet Take 1 tablet (500 mg total) by mouth every 6 (six) hours as needed for mild pain. (Patient taking differently: Take 1,000 mg by mouth every 6 (six) hours as needed for mild pain.)     albuterol (VENTOLIN HFA) 108 (90 Base) MCG/ACT inhaler Inhale 2 puffs into the lungs every 6 (six) hours as needed for wheezing or shortness of breath.     Ascorbic Acid (VITAMIN C) 1000 MG tablet Take 1,000 mg by mouth daily.     aspirin EC 81 MG tablet Take 81 mg by mouth daily.     calcium carbonate (TUMS - DOSED IN MG ELEMENTAL CALCIUM) 500 MG chewable tablet Chew 1,000 mg by mouth daily as needed for indigestion or heartburn.     cyanocobalamin (,VITAMIN B-12,) 1000 MCG/ML injection Inject 1,000 mcg into the muscle every 30 (thirty) days.     Denosumab (XGEVA [place]) Inject 1 Dose into the skin every 28 (twenty-eight) days.     glycerin adult 2 g suppository Place 1 suppository rectally as needed for constipation.     guaiFENesin (MUCINEX) 600 MG 12 hr tablet Take 1,200 mg by mouth 2 (two) times daily.      hydroxypropyl methylcellulose / hypromellose (ISOPTO TEARS / GONIOVISC) 2.5 % ophthalmic solution Place 1 drop into both eyes daily as needed for dry eyes.     leuprolide, 6 Month, (ELIGARD) 45 MG injection Inject 45 mg into the skin every 6 (six) months.     MAGNESIUM PO Take 400 mg by mouth daily.     meclizine (  ANTIVERT) 25 MG tablet Take 25 mg by mouth See admin instructions. Take 25 mg daily, may take a second 25 mg dose as needed  for dizziness     Misc Natural Products (NEURIVA PO) Take 1 capsule by mouth daily.     Multiple Vitamin (MULTIVITAMIN WITH MINERALS) TABS tablet Take 1 tablet by mouth every morning.     OVER THE COUNTER MEDICATION Apply 1 application topically daily as needed (pain). Hempvana otc pain relieving cream     PARoxetine (PAXIL) 40 MG tablet Take 1 tablet (40 mg total) by mouth daily. (Patient taking differently: Take 40 mg by mouth at bedtime.) 30 tablet 11   Psyllium (METAMUCIL PO) Take 1 Dose by mouth daily. 1 dose = 3 teaspoonful     senna-docusate (SENOKOT-S) 8.6-50 MG tablet Take 1 tablet by mouth at bedtime. (Patient taking differently: Take 1 tablet by mouth at bedtime as needed for mild constipation.) 30 tablet 0   traZODone (DESYREL) 50 MG tablet Take 50 mg by mouth at bedtime as needed for sleep.     VITAMIN E PO Take 400 Units by mouth every Wednesday.     XTANDI 40 MG tablet Take 80 mg by mouth 2 (two) times daily.     No current facility-administered medications on file prior to visit.    Allergies:   Allergies  Allergen Reactions   Bee Venom Swelling    Physical Exam General: Frail elderly Caucasian male seated, in no evident distress Head: head normocephalic and atraumatic.   Neck: supple with no carotid or supraclavicular bruits Cardiovascular: regular rate and rhythm, no murmurs Musculoskeletal: mild Kyphoscoliosis. Skin:  no rash/petichiae Vascular:  Normal pulses all extremities  Neurologic Exam Mental Status: Awake and fully alert. Oriented to place and time. Recent and remote memory intact. Attention span, concentration and fund of knowledge appropriate. Mood and affect appropriate.  Cranial Nerves: Fundoscopic exam not dones. Pupils equal, briskly reactive to light. Extraocular movements full without nystagmus. Visual fields full to confrontation. Hearing intact. Facial sensation intact. Face, tongue, palate moves normally and symmetrically.  Motor: Normal bulk and  tone. Normal strength in all tested extremity muscles .Marland Kitchen Sensory.: intact to touch , pinprick , position and vibratory sensation.  Coordination: Rapid alternating movements normal in all extremities. Finger-to-nose and heel-to-shin performed accurately bilaterally. Gait and Station: Gait deferred as patient is a fall risk and is sitting in a wheelchair Reflexes: 1+ and symmetric. Toes downgoing.      ASSESSMENT: 88 year old patient with episode of unwitnessed loss of consciousness with some jerking possibly convulsive syncope rather than seizure.  Given nonfocal neurological exam and unremarkable brain imaging and EEG will hold off on anticonvulsants for now particularly given documentation of significant orthostatic hypotension which could have contributed..  Remote history of possible seizures related to alcohol in the past as well as a history of syncopal episodes.     PLAN: I had a long discussion with patient and his wife regarding his syncopal episodes gait instability which appears now stable..  I recommend he continue aspirin 81 mg daily and maintain aggressive risk factor modification with strict control of hypertension blood pressure goal below 130/90, lipids with LDL cholesterol goal below 70 mg percent and diabetes with hemoglobin A1c goal below 6.5%.  Continue medical management for his asymptomatic vertebral artery stenosis for now . He will return for f/u in 1 year or call earlier if needed.I also advised him to drink enough fluids and do orthostatic tolerance exercises before getting  up or standing..Greater than 50% time during this 30-minute  visit was spent on counseling and coordination of care about convulsive syncope versus seizure and answering questions. Antony Contras, MD Note: This document was prepared with digital dictation and possible smart phrase technology. Any transcriptional errors that result from this process are unintentional.

## 2021-09-16 DIAGNOSIS — R001 Bradycardia, unspecified: Secondary | ICD-10-CM | POA: Diagnosis not present

## 2021-09-16 DIAGNOSIS — C7951 Secondary malignant neoplasm of bone: Secondary | ICD-10-CM | POA: Diagnosis not present

## 2021-09-16 DIAGNOSIS — I441 Atrioventricular block, second degree: Secondary | ICD-10-CM | POA: Diagnosis not present

## 2021-09-16 DIAGNOSIS — I951 Orthostatic hypotension: Secondary | ICD-10-CM | POA: Diagnosis not present

## 2021-09-16 DIAGNOSIS — J42 Unspecified chronic bronchitis: Secondary | ICD-10-CM | POA: Diagnosis not present

## 2021-09-16 DIAGNOSIS — Z66 Do not resuscitate: Secondary | ICD-10-CM | POA: Diagnosis not present

## 2021-09-16 DIAGNOSIS — N1832 Chronic kidney disease, stage 3b: Secondary | ICD-10-CM | POA: Diagnosis not present

## 2021-09-16 DIAGNOSIS — E441 Mild protein-calorie malnutrition: Secondary | ICD-10-CM | POA: Diagnosis not present

## 2021-09-16 DIAGNOSIS — I129 Hypertensive chronic kidney disease with stage 1 through stage 4 chronic kidney disease, or unspecified chronic kidney disease: Secondary | ICD-10-CM | POA: Diagnosis not present

## 2021-09-16 DIAGNOSIS — R55 Syncope and collapse: Secondary | ICD-10-CM | POA: Diagnosis not present

## 2021-09-16 DIAGNOSIS — D692 Other nonthrombocytopenic purpura: Secondary | ICD-10-CM | POA: Diagnosis not present

## 2021-09-16 DIAGNOSIS — C61 Malignant neoplasm of prostate: Secondary | ICD-10-CM | POA: Diagnosis not present

## 2021-09-21 DIAGNOSIS — C61 Malignant neoplasm of prostate: Secondary | ICD-10-CM | POA: Diagnosis not present

## 2021-09-21 DIAGNOSIS — C7951 Secondary malignant neoplasm of bone: Secondary | ICD-10-CM | POA: Diagnosis not present

## 2021-09-29 ENCOUNTER — Ambulatory Visit: Payer: Medicare Other | Admitting: Internal Medicine

## 2021-10-19 DIAGNOSIS — C7951 Secondary malignant neoplasm of bone: Secondary | ICD-10-CM | POA: Diagnosis not present

## 2021-10-19 DIAGNOSIS — C61 Malignant neoplasm of prostate: Secondary | ICD-10-CM | POA: Diagnosis not present

## 2021-10-25 ENCOUNTER — Ambulatory Visit (INDEPENDENT_AMBULATORY_CARE_PROVIDER_SITE_OTHER): Payer: Medicare Other | Admitting: Podiatry

## 2021-10-25 ENCOUNTER — Other Ambulatory Visit: Payer: Self-pay

## 2021-10-25 DIAGNOSIS — B351 Tinea unguium: Secondary | ICD-10-CM | POA: Diagnosis not present

## 2021-10-25 DIAGNOSIS — M79675 Pain in left toe(s): Secondary | ICD-10-CM | POA: Diagnosis not present

## 2021-10-25 DIAGNOSIS — M79674 Pain in right toe(s): Secondary | ICD-10-CM | POA: Diagnosis not present

## 2021-10-27 DIAGNOSIS — C61 Malignant neoplasm of prostate: Secondary | ICD-10-CM | POA: Diagnosis not present

## 2021-10-28 NOTE — Progress Notes (Signed)
Subjective:  ? ?Patient ID: Steven Cain, male   DOB: 88 y.o.   MRN: [id number]  ? 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  Denies any swelling or redness or any drainage to the toenail sites.  No open sores that he reports.  Presents today with his wife.  He has no other concerns today. ? ? ?Review of Systems  ?All other systems reviewed and are negative. ? ?Past Medical History:  ?Diagnosis Date  ? Abnormal EKG   ? hx of left anterior fasicular block on ekg, no cardiologist  ? Anxiety   ? AR (aortic regurgitation) 03/23/2017  ? Mild, Noted on ECHO  ? Arthritis   ? DJD  ? Benign enlargement of prostate   ? frequent UTI, trim of prostate done , with some issues of incontinence now.  ? Cancer ([place])   ? Chronic bronchitis ([place])   ? chronic- usually has phelgm often  ? COVID-19 08/2019  ? fatigue and loss of taste and smell x 10 days did monoclonal antibody therapy  ? Depression   ? Dyspnea   ? with excertion  ? Fluid retention in legs   ? resolved  ? GERD (gastroesophageal reflux disease)   ? rare  ? GI bleed   ? Grade I diastolic dysfunction   ? Hard of hearing   ? hear more on left side- no hearing aid  ? History of colon polyps   ? History of transfusion 2016  ? Hydronephrosis 2018  ? Left ckd stage 3 no nephrologist per wife adele  ? Hypertension   ? Moderate concentric left ventricular hypertrophy 03/23/2017  ? Noted on ECHO  ? Perforated, severe stomach ulcer ([place]) 1974  ? Peripheral neuropathy   ? feet and toes  ? Prostate cancer metastatic to bone [facility])   ? Spinal stenosis   ? TR (tricuspid regurgitation) 03/23/2017  ? Mild, Noted on ECHO  ? Transfusion history   ? none recent  ? Ureteral stricture   ? CHRONIC  ? Urinary incontinence   ? UTI (urinary tract infection)   ? frequent  ? Wears glasses   ? ? ?Past Surgical History:  ?Procedure Laterality Date  ? ABDOMINAL SURGERY    ? bilroths tube '74  ? COLONOSCOPY N/A 09/29/2014  ? Procedure:  COLONOSCOPY;  Surgeon: Beryle Beams, MD;  Location: [facility];  Service: Endoscopy;  Laterality: N/A;  ? CYSTOSCOPY W/ URETERAL STENT PLACEMENT Left 05/06/2014  ? Procedure: CYSTOSCOPY WITH RETROGRADE PYELOGRAM/URETERAL STENT PLACEMENT;  Surgeon: [name], MD;  Location: [facility];  Service: Urology;  Laterality: Left;  ? CYSTOSCOPY W/ URETERAL STENT PLACEMENT Left 03/31/2015  ? Procedure: CYSTOSCOPY WITH LEFT RETROGRADE PYELOGRAM/URETERAL STENT EXCHANGE;  Surgeon: [name], MD;  Location: [facility];  Service: Urology;  Laterality: Left;  ? CYSTOSCOPY W/ URETERAL STENT PLACEMENT Left 09/15/2015  ? Procedure: CYSTOSCOPY WITH LEFT RETROGRADE PYELOGRAM/URETERAL STENT EXCHANGE ;  Surgeon: [name], MD;  Location: [facility];  Service: Urology;  Laterality: Left;  ? CYSTOSCOPY W/ URETERAL STENT PLACEMENT Left 04/28/2016  ? Procedure: CYSTOSCOPY WITH RETROGRADE PYELOGRAM/URETERAL STENT EXCHANGE;  Surgeon: [name], MD;  Location: [facility];  Service: Urology;  Laterality: Left;  ? CYSTOSCOPY W/ URETERAL STENT PLACEMENT Left 11/08/2016  ? Procedure: CYSTOSCOPY WITH RETROGRADE PYELOGRAM/URETERAL STENT EXCHANGE;  Surgeon: [name], MD;  Location: [facility];  Service: Urology;  Laterality: Left;  ? CYSTOSCOPY W/ URETERAL STENT PLACEMENT Left  09/14/2017  ? Procedure: CYSTOSCOPY WITH RETROGRADE PYELOGRAM/URETERAL STENT PLACEMENT;  Surgeon: [name], MD;  Location: [facility];  Service: Urology;  Laterality: Left;  ? CYSTOSCOPY W/ URETERAL STENT PLACEMENT Left 04/10/2018  ? Procedure: CYSTOSCOPY WITH LEFT RETROGRADE URETERAL STENT EXCHANGE;  Surgeon: [name], MD;  Location[facility];  Service: Urology;  Laterality: Left;  ? CYSTOSCOPY W/ URETERAL STENT PLACEMENT Left 01/31/2019  ? Procedure: CYSTOSCOPY WITH RETROGRADE PYELOGRAM/URETERAL STENT PLACEMENT;  Surgeon: [name], MD;  Location: [facility];  Service: Urology;  Laterality: Left;  45 MINS  ? CYSTOSCOPY W/ URETERAL STENT PLACEMENT  Left 01/16/2020  ? Procedure: CYSTOSCOPY WITH RETROGRADE PYELOGRAM/URETERAL STENT EXCHANGED;  Surgeon: [name], MD;  [facility];  Service: Urology;  Laterality: Left;  ? CYSTOSCOPY W/ URETERAL STENT PLACEMENT Left 04/29/2021  ? Procedure: CYSTOSCOPY WITH RETROGRADE PYELOGRAM/URETERAL STENT EXCHANGE;  Surgeon: [name], MD;  Location: [facility];  Service: Urology;  Laterality: Left;  ? CYSTOSCOPY WITH RETROGRADE PYELOGRAM, URETEROSCOPY AND STENT PLACEMENT Left 10/21/2014  ? Procedure: CYSTOSCOPY WITH RETROGRADE PYELOGRAM, URETEROSCOPY,BIOPSY AND STENT CHANGE;  Surgeon: [name], MD;  Location: [facility];  Service: Urology;  Laterality: Left;  ? CYSTOSCOPY/RETROGRADE/URETEROSCOPY Left 06/03/2014  ? Procedure: CYSTOSCOPY/RETROGRADE/URETEROSCOPY WITH BIOPSY,STENT EXCHANGE;  Surgeon: [name], MD;  Location: [facility];  Service: Urology;  Laterality: Left;  ? ESOPHAGOGASTRODUODENOSCOPY N/A 09/28/2014  ? Procedure: ESOPHAGOGASTRODUODENOSCOPY (EGD);  Surgeon: Beryle Beams, MD;  Location: [facility];  Service: Endoscopy;  Laterality: N/A;  ? GASTRIC ROUX-EN-Y    ? '74  ? HERNIA REPAIR    ? bilateral hernia repairs   ? IR ANGIO INTRA EXTRACRAN SEL COM CAROTID INNOMINATE BILAT MOD SED  01/20/2021  ? IR ANGIO VERTEBRAL SEL SUBCLAVIAN INNOMINATE BILAT MOD SED  01/20/2021  ? IR US GUIDE VASC ACCESS RIGHT  01/20/2021  ? LAPAROSCOPIC APPENDECTOMY N/A 02/15/2020  ? Procedure: APPENDECTOMY LAPAROSCOPIC;  Surgeon: Jovita Kussmaul, MD;  Location: [facility];  Service: General;  Laterality: N/A;  ? TONSILLECTOMY  as child  ? TOTAL HIP ARTHROPLASTY Left 05/19/2016  ? Procedure: LEFT TOTAL HIP ARTHROPLASTY ANTERIOR APPROACH;  Surgeon: Mcarthur Rossetti, MD;  Location: [facility];  Service: Orthopedics;  Laterality: Left;  ? TRANSURETHRAL RESECTION OF PROSTATE    ? VAGOTOMY  1950's  ? ? ? ?Current Outpatient Medications:  ?  acetaminophen (TYLENOL) 500 MG tablet, Take 1 tablet (500 mg total) by mouth every 6  (six) hours as needed for mild pain. (Patient taking differently: Take 1,000 mg by mouth every 6 (six) hours as needed for mild pain.), Disp: , Rfl:  ?  albuterol (VENTOLIN HFA) 108 (90 Base) MCG/ACT inhaler, Inhale 2 puffs into the lungs every 6 (six) hours as needed for wheezing or shortness of breath., Disp: , Rfl:  ?  Ascorbic Acid (VITAMIN C) 1000 MG tablet, Take 1,000 mg by mouth daily., Disp: , Rfl:  ?  aspirin EC 81 MG tablet, Take 81 mg by mouth daily., Disp: , Rfl:  ?  calcium carbonate (TUMS - DOSED IN MG ELEMENTAL CALCIUM) 500 MG chewable tablet, Chew 1,000 mg by mouth daily as needed for indigestion or heartburn., Disp: , Rfl:  ?  cyanocobalamin (,VITAMIN B-12,) 1000 MCG/ML injection, Inject 1,000 mcg into the muscle every 30 (thirty) days., Disp: , Rfl:  ?  Denosumab (XGEVA [place]), Inject 1 Dose into the skin every 28 (twenty-eight) days., Disp: , Rfl:  ?  glycerin adult 2 g suppository, Place 1 suppository rectally as  needed for constipation., Disp: , Rfl:  ?  guaiFENesin (MUCINEX) 600 MG 12 hr tablet, Take 1,200 mg by mouth 2 (two) times daily. , Disp: , Rfl:  ?  hydroxypropyl methylcellulose / hypromellose (ISOPTO TEARS / GONIOVISC) 2.5 % ophthalmic solution, Place 1 drop into both eyes daily as needed for dry eyes., Disp: , Rfl:  ?  leuprolide, 6 Month, (ELIGARD) 45 MG injection, Inject 45 mg into the skin every 6 (six) months., Disp: , Rfl:  ?  MAGNESIUM PO, Take 400 mg by mouth daily., Disp: , Rfl:  ?  meclizine (ANTIVERT) 25 MG tablet, Take 25 mg by mouth See admin instructions. Take 25 mg daily, may take a second 25 mg dose as needed for dizziness, Disp: , Rfl:  ?  Misc Natural Products (NEURIVA PO), Take 1 capsule by mouth daily., Disp: , Rfl:  ?  Multiple Vitamin (MULTIVITAMIN WITH MINERALS) TABS tablet, Take 1 tablet by mouth every morning., Disp: , Rfl:  ?  OVER THE COUNTER MEDICATION, Apply 1 application topically daily as needed (pain). Hempvana otc pain relieving cream, Disp: , Rfl:  ?   PARoxetine (PAXIL) 40 MG tablet, Take 1 tablet (40 mg total) by mouth daily. (Patient taking differently: Take 40 mg by mouth at bedtime.), Disp: 30 tablet, Rfl: 11 ?  Psyllium (METAMUCIL PO), Take 1 Dose

## 2021-11-03 DIAGNOSIS — C7951 Secondary malignant neoplasm of bone: Secondary | ICD-10-CM | POA: Diagnosis not present

## 2021-11-03 DIAGNOSIS — C61 Malignant neoplasm of prostate: Secondary | ICD-10-CM | POA: Diagnosis not present

## 2021-11-03 DIAGNOSIS — N3941 Urge incontinence: Secondary | ICD-10-CM | POA: Diagnosis not present

## 2021-11-23 DIAGNOSIS — C61 Malignant neoplasm of prostate: Secondary | ICD-10-CM | POA: Diagnosis not present

## 2021-11-23 DIAGNOSIS — C7951 Secondary malignant neoplasm of bone: Secondary | ICD-10-CM | POA: Diagnosis not present

## 2021-12-07 DIAGNOSIS — U071 COVID-19: Secondary | ICD-10-CM | POA: Diagnosis not present

## 2021-12-21 DIAGNOSIS — C61 Malignant neoplasm of prostate: Secondary | ICD-10-CM | POA: Diagnosis not present

## 2021-12-21 DIAGNOSIS — C7951 Secondary malignant neoplasm of bone: Secondary | ICD-10-CM | POA: Diagnosis not present

## 2022-01-04 ENCOUNTER — Other Ambulatory Visit (HOSPITAL_COMMUNITY): Payer: Self-pay | Admitting: Urology

## 2022-01-04 ENCOUNTER — Other Ambulatory Visit: Payer: Self-pay | Admitting: Urology

## 2022-01-04 DIAGNOSIS — C7951 Secondary malignant neoplasm of bone: Secondary | ICD-10-CM

## 2022-01-19 DIAGNOSIS — C7951 Secondary malignant neoplasm of bone: Secondary | ICD-10-CM | POA: Diagnosis not present

## 2022-01-19 DIAGNOSIS — C61 Malignant neoplasm of prostate: Secondary | ICD-10-CM | POA: Diagnosis not present

## 2022-01-24 ENCOUNTER — Ambulatory Visit (INDEPENDENT_AMBULATORY_CARE_PROVIDER_SITE_OTHER): Payer: Medicare Other | Admitting: Podiatry

## 2022-01-24 DIAGNOSIS — B351 Tinea unguium: Secondary | ICD-10-CM

## 2022-01-24 DIAGNOSIS — M79675 Pain in left toe(s): Secondary | ICD-10-CM | POA: Diagnosis not present

## 2022-01-24 DIAGNOSIS — M79674 Pain in right toe(s): Secondary | ICD-10-CM | POA: Diagnosis not present

## 2022-01-31 NOTE — Progress Notes (Signed)
Subjective: 88 y.o. returns the office today for painful, elongated, thickened toenails which he cannot trim himself. Denies any redness or drainage around the nails.  He feels that the toenails are doing better since they were trimmed last.  No swelling or redness or any drainage or any other concerns today.   PCP: Shon Baton, MD  Objective: NAD DP/PT pulses palpable, CRT less than 3 seconds Nails hypertrophic, dystrophic, elongated, brittle, discolored 10. There is tenderness overlying the nails 1-5 bilaterally. There is no surrounding erythema or drainage along the nail sites. No open lesions or pre-ulcerative lesions are identified. No other areas of tenderness bilateral lower extremities. No overlying edema, erythema, increased warmth. No pain with calf compression, swelling, warmth, erythema.  Assessment: Patient presents with symptomatic onychomycosis  Plan: -Treatment options including alternatives, risks, complications were discussed -Nails sharply debrided 10 without complication/bleeding. -Discussed daily foot inspection. If there are any changes, to call the office immediately.  -Follow-up in 3 months or sooner if any problems are to arise. In the meantime, encouraged to call the office with any questions, concerns, changes symptoms.  Celesta Gentile, DPM

## 2022-02-02 DIAGNOSIS — C61 Malignant neoplasm of prostate: Secondary | ICD-10-CM | POA: Diagnosis not present

## 2022-02-03 ENCOUNTER — Encounter (HOSPITAL_COMMUNITY)
Admission: RE | Admit: 2022-02-03 | Discharge: 2022-02-03 | Disposition: A | Discharge status: Home / Self Care | Payer: Medicare Other | Source: Ambulatory Visit | Attending: Urology | Admitting: Urology

## 2022-02-03 DIAGNOSIS — C7952 Secondary malignant neoplasm of bone marrow: Secondary | ICD-10-CM | POA: Insufficient documentation

## 2022-02-03 DIAGNOSIS — N134 Hydroureter: Secondary | ICD-10-CM | POA: Diagnosis not present

## 2022-02-03 DIAGNOSIS — N1330 Unspecified hydronephrosis: Secondary | ICD-10-CM | POA: Diagnosis not present

## 2022-02-03 DIAGNOSIS — C61 Malignant neoplasm of prostate: Secondary | ICD-10-CM | POA: Diagnosis not present

## 2022-02-03 DIAGNOSIS — C7951 Secondary malignant neoplasm of bone: Secondary | ICD-10-CM | POA: Insufficient documentation

## 2022-02-03 MED ORDER — TECHNETIUM TC 99M MEDRONATE IV KIT
20.1000 | PACK | Freq: Once | INTRAVENOUS | Status: AC
  Administered 2022-02-03: 20.1 via INTRAVENOUS

## 2022-02-09 DIAGNOSIS — C61 Malignant neoplasm of prostate: Secondary | ICD-10-CM | POA: Diagnosis not present

## 2022-02-09 DIAGNOSIS — C7951 Secondary malignant neoplasm of bone: Secondary | ICD-10-CM | POA: Diagnosis not present

## 2022-02-09 DIAGNOSIS — N135 Crossing vessel and stricture of ureter without hydronephrosis: Secondary | ICD-10-CM | POA: Diagnosis not present

## 2022-02-20 NOTE — Progress Notes (Signed)
GU Location of Tumor / Histology: Prostate Ca  Associate Dx:  Bone metastasis  If Prostate Cancer, PSA is (119.0 as of 01/2022)  02/03/2022 Dr. Tresa Moore CT Abdomen and Pelvis without/with Contrast     02/03/2022 Dr. Tresa Moore NM Bone Scan Whole Body  FINDINGS: Multiple sites of abnormal tracer uptake are seen consistent with osseous metastatic disease, progressive since previous exam.   These include thoracic spine, BILATERAL ribs, sternum, LEFT scapula, RIGHT clavicular head, pelvis, RIGHT humerus, and proximal LEFT femur.   Mildly increased LEFT hydronephrosis corresponding to CT finding.   Otherwise expected urinary tract and soft tissue distribution of tracer.   IMPRESSION: Progressive osseous metastatic disease.  Mild increase in LEFT hydronephrosis since prior bone scan.    06/24/2020 Dr. Tresa Moore NM Bone Scan Whole Body  FINDINGS: Abnormal increased tracer uptake identified at anterior RIGHT sixth rib, posterior LEFT 6 seventh and ninth ribs, lumbar spine, pelvis, LEFT scapula, and mid to distal RIGHT humeral diaphysis consistent with osseous metastases.   LEFT hip prosthesis identified with significant uptake at the distal aspect of the femoral component of the prosthesis, could be related to surgery or loosening of the prosthesis but metastatic disease not excluded.   Scattered degenerative type uptake at RIGHT knee, RIGHT hip, BILATERAL shoulders, RIGHT wrist, several costovertebral junctions.   Questionable uptake at the LEFT humerus beyond on the margin of the exam.   Expected urinary tract and soft tissue distribution of tracer.   IMPRESSION: Scattered osseous metastases including RIGHT humerus and questionably LEFT humerus.   LEFT hip prosthesis with uptake at the distal aspect of the femoral component, may be related to a complication of the prosthesis such as loosening or infection though coexistent metastatic disease not excluded.   Recommend radiographic  correlation of the humeri and the LEFT femur to exclude lesions at risk of pathologic fracture.  Past/Anticipated interventions by urology, if any:     Past/Anticipated interventions by medical oncology, if any:  NA  Weight changes, if any:   IPSS: SHIM:  Bowel/Bladder complaints, if any: {:18581}   Nausea/Vomiting, if any: {:18581}  Pain issues, if any:  {:18581}  SAFETY ISSUES: Prior radiation? {:18581} Pacemaker/ICD? {:18581} Possible current pregnancy? Male Is the patient on methotrexate?   Current Complaints / other details:  Eligard 45 mg injection every 6 months.

## 2022-02-21 ENCOUNTER — Telehealth: Payer: Self-pay | Admitting: *Deleted

## 2022-02-21 ENCOUNTER — Ambulatory Visit
Admission: RE | Admit: 2022-02-21 | Discharge: 2022-02-21 | Disposition: A | Discharge status: Home / Self Care | Payer: Medicare Other | Source: Ambulatory Visit | Attending: Radiation Oncology | Admitting: Radiation Oncology

## 2022-02-21 ENCOUNTER — Other Ambulatory Visit: Payer: Self-pay

## 2022-02-21 ENCOUNTER — Telehealth: Payer: Self-pay

## 2022-02-21 VITALS — BP 159/79 | HR 87 | Temp 98.3°F | Wt 169.6 lb

## 2022-02-21 VITALS — BP 159/79 | HR 87 | Temp 98.3°F | Resp 18 | Wt 169.6 lb

## 2022-02-21 DIAGNOSIS — I082 Rheumatic disorders of both aortic and tricuspid valves: Secondary | ICD-10-CM | POA: Insufficient documentation

## 2022-02-21 DIAGNOSIS — N183 Chronic kidney disease, stage 3 unspecified: Secondary | ICD-10-CM | POA: Diagnosis not present

## 2022-02-21 DIAGNOSIS — C61 Malignant neoplasm of prostate: Secondary | ICD-10-CM | POA: Insufficient documentation

## 2022-02-21 DIAGNOSIS — M8580 Other specified disorders of bone density and structure, unspecified site: Secondary | ICD-10-CM | POA: Insufficient documentation

## 2022-02-21 DIAGNOSIS — C7951 Secondary malignant neoplasm of bone: Secondary | ICD-10-CM | POA: Insufficient documentation

## 2022-02-21 DIAGNOSIS — N1330 Unspecified hydronephrosis: Secondary | ICD-10-CM | POA: Diagnosis not present

## 2022-02-21 DIAGNOSIS — Z87891 Personal history of nicotine dependence: Secondary | ICD-10-CM | POA: Insufficient documentation

## 2022-02-21 DIAGNOSIS — K219 Gastro-esophageal reflux disease without esophagitis: Secondary | ICD-10-CM | POA: Diagnosis not present

## 2022-02-21 DIAGNOSIS — Z8719 Personal history of other diseases of the digestive system: Secondary | ICD-10-CM | POA: Insufficient documentation

## 2022-02-21 DIAGNOSIS — Z8616 Personal history of COVID-19: Secondary | ICD-10-CM | POA: Insufficient documentation

## 2022-02-21 DIAGNOSIS — Z7952 Long term (current) use of systemic steroids: Secondary | ICD-10-CM | POA: Diagnosis not present

## 2022-02-21 DIAGNOSIS — Z192 Hormone resistant malignancy status: Secondary | ICD-10-CM | POA: Diagnosis not present

## 2022-02-21 DIAGNOSIS — I129 Hypertensive chronic kidney disease with stage 1 through stage 4 chronic kidney disease, or unspecified chronic kidney disease: Secondary | ICD-10-CM | POA: Insufficient documentation

## 2022-02-21 DIAGNOSIS — Z8546 Personal history of malignant neoplasm of prostate: Secondary | ICD-10-CM | POA: Insufficient documentation

## 2022-02-21 DIAGNOSIS — Z7982 Long term (current) use of aspirin: Secondary | ICD-10-CM | POA: Diagnosis not present

## 2022-02-21 LAB — CBC WITH DIFFERENTIAL (CANCER CENTER ONLY)
Abs Immature Granulocytes: 0.02 10*3/uL (ref 0.00–0.07)
Basophils Absolute: 0.1 10*3/uL (ref 0.0–0.1)
Basophils Relative: 1 %
Eosinophils Absolute: 0.2 10*3/uL (ref 0.0–0.5)
Eosinophils Relative: 2 %
HCT: 38.6 % — ABNORMAL LOW (ref 39.0–52.0)
Hemoglobin: 12.6 g/dL — ABNORMAL LOW (ref 13.0–17.0)
Immature Granulocytes: 0 %
Lymphocytes Relative: 24 %
Lymphs Abs: 1.9 10*3/uL (ref 0.7–4.0)
MCH: 29 pg (ref 26.0–34.0)
MCHC: 32.6 g/dL (ref 30.0–36.0)
MCV: 88.7 fL (ref 80.0–100.0)
Monocytes Absolute: 0.5 10*3/uL (ref 0.1–1.0)
Monocytes Relative: 6 %
Neutro Abs: 5.4 10*3/uL (ref 1.7–7.7)
Neutrophils Relative %: 67 %
Platelet Count: 299 10*3/uL (ref 150–400)
RBC: 4.35 MIL/uL (ref 4.22–5.81)
RDW: 14.7 % (ref 11.5–15.5)
WBC Count: 8.1 10*3/uL (ref 4.0–10.5)
nRBC: 0 % (ref 0.0–0.2)

## 2022-02-21 NOTE — Telephone Encounter (Signed)
CALLED PATIENT TO INFORM OF LAB AND WEIGHT APPT. ON 02-24-22 @ 12 PM @ [place]. ON 03-03-22 - ARRIVAL TIME- 11:45 AM @ [facility], SPOKE WITH PATIENT'S WIFE- [name] AND SHE IS AWARE OF THESE APPTS.

## 2022-02-21 NOTE — Progress Notes (Signed)
Radiation Oncology         [phone] ________________________________  Initial Outpatient Consultation  Name: Steven Cain MRN: [id number]  Date: 02/21/2022  DOB: 1930-09-27  YQ:[id number], Steven Reichmann, MD  Steven Frock, MD   REFERRING PHYSICIAN: [name], MD  DIAGNOSIS: 88 y.o. gentleman with widespread osseous metastatic disease from prostate cancer    ICD-10-CM   1. Malignant neoplasm of prostate ([place])  C61     2. Prostate cancer metastatic to bone [facility])  C61    C79.51       HISTORY OF PRESENT ILLNESS: Steven Cain is a 88 y.o. male with a diagnosis of metastatic prostate cancer. Of note, he was previously on androgens for osteopenia per his PCP. He was initially diagnosed with high risk prostate cancer in 05/2020 by Steven Cain. In summary, he underwent prostate biopsy for an elevated PSA of 21 and was found to have grade 5 cancer with 12/12 positive cores. He was started on Eligard at that time. Staging bone scan performed in 06/2020 showed scattered osseous metastasis including right humerus with questionable involvement in left humerus and left hip prosthesis. He was also started on monthly Xgeva in 10/2020. His PSA initially dropped to 14.1 in 08/2020 but rose to 19.3 in 12/2020, prompting Steven Cain to start him on [place]. He was later switched to [facility] in 10/2021 for a PSA of 53.6.  He recently underwent restaging CT A/P and bone scan on 02/03/22, both showing progressive osseous metastatic disease. CT showed no signs of solid organ or nodal metastatic disease. PSA from that day was up to 119.  The patient reviewed the scan results with his urologist and he has kindly been referred today for discussion of potential Xofigo.   PREVIOUS RADIATION THERAPY: No  PAST MEDICAL HISTORY:  Past Medical History:  Diagnosis Date   Abnormal EKG    hx of left anterior fasicular block on ekg, no cardiologist   Anxiety    AR (aortic regurgitation) 03/23/2017   Mild, Noted on  ECHO   Arthritis    DJD   Benign enlargement of prostate    frequent UTI, trim of prostate done , with some issues of incontinence now.   Cancer ([place])    Chronic bronchitis ([place])    chronic- usually has phelgm often   COVID-19 08/2019   fatigue and loss of taste and smell x 10 days did monoclonal antibody therapy   Depression    Dyspnea    with excertion   Fluid retention in legs    resolved   GERD (gastroesophageal reflux disease)    rare   GI bleed    Grade I diastolic dysfunction    Hard of hearing    hear more on left side- no hearing aid   History of colon polyps    History of transfusion 2016   Hydronephrosis 2018   Left ckd stage 3 no nephrologist per wife Steven Cain   Hypertension    Moderate concentric left ventricular hypertrophy 03/23/2017   Noted on ECHO   Perforated, severe stomach ulcer ([place]) 1974   Peripheral neuropathy    feet and toes   Prostate cancer metastatic to bone [facility])    Spinal stenosis    TR (tricuspid regurgitation) 03/23/2017   Mild, Noted on ECHO   Transfusion history    none recent   Ureteral stricture    CHRONIC   Urinary incontinence    UTI (urinary tract infection)    frequent   Wears  glasses       PAST SURGICAL HISTORY: Past Surgical History:  Procedure Laterality Date   ABDOMINAL SURGERY     bilroths tube '74   COLONOSCOPY N/A 09/29/2014   Procedure: COLONOSCOPY;  Surgeon: Beryle Beams, MD;  [facility];  Service: Endoscopy;  Laterality: N/A;   CYSTOSCOPY W/ URETERAL STENT PLACEMENT Left 05/06/2014   Procedure: CYSTOSCOPY WITH RETROGRADE PYELOGRAM/URETERAL STENT PLACEMENT;  Surgeon: Steven Frock, MD;  [facility];  Service: Urology;  Laterality: Left;   CYSTOSCOPY W/ URETERAL STENT PLACEMENT Left 03/31/2015   Procedure: CYSTOSCOPY WITH LEFT RETROGRADE PYELOGRAM/URETERAL STENT EXCHANGE;  Surgeon: Steven Frock, MD;  [facility];  Service: Urology;  Laterality: Left;   CYSTOSCOPY W/ URETERAL STENT PLACEMENT  Left 09/15/2015   Procedure: CYSTOSCOPY WITH LEFT RETROGRADE PYELOGRAM/URETERAL STENT EXCHANGE ;  Surgeon: Steven Frock, MD;  [facility];  Service: Urology;  Laterality: Left;   CYSTOSCOPY W/ URETERAL STENT PLACEMENT Left 04/28/2016   Procedure: CYSTOSCOPY WITH RETROGRADE PYELOGRAM/URETERAL STENT EXCHANGE;  Surgeon: Steven Frock, MD;  [facility];  Service: Urology;  Laterality: Left;   CYSTOSCOPY W/ URETERAL STENT PLACEMENT Left 11/08/2016   Procedure: CYSTOSCOPY WITH RETROGRADE PYELOGRAM/URETERAL STENT EXCHANGE;  Surgeon: Steven Frock, MD;  [facility];  Service: Urology;  Laterality: Left;   CYSTOSCOPY W/ URETERAL STENT PLACEMENT Left 09/14/2017   Procedure: CYSTOSCOPY WITH RETROGRADE PYELOGRAM/URETERAL STENT PLACEMENT;  Surgeon: Steven Frock, MD;  [facility];  Service: Urology;  Laterality: Left;   CYSTOSCOPY W/ URETERAL STENT PLACEMENT Left 04/10/2018   Procedure: CYSTOSCOPY WITH LEFT RETROGRADE URETERAL STENT EXCHANGE;  Surgeon: Steven Frock, MD;  Location[facility];  Service: Urology;  Laterality: Left;   CYSTOSCOPY W/ URETERAL STENT PLACEMENT Left 01/31/2019   Procedure: CYSTOSCOPY WITH RETROGRADE PYELOGRAM/URETERAL STENT PLACEMENT;  Surgeon: Steven Frock, MD;  [facility];  Service: Urology;  Laterality: Left;  [place] PLACEMENT Left 01/16/2020   Procedure: CYSTOSCOPY WITH RETROGRADE PYELOGRAM/URETERAL STENT EXCHANGED;  Surgeon: Steven Frock, MD;  Location: [facility];  Service: Urology;  Laterality: Left;   CYSTOSCOPY W/ URETERAL STENT PLACEMENT Left 04/29/2021   Procedure: CYSTOSCOPY WITH RETROGRADE PYELOGRAM/URETERAL STENT EXCHANGE;  Surgeon: Steven Frock, MD;  [facility];  Service: Urology;  Laterality: Left;   CYSTOSCOPY WITH RETROGRADE PYELOGRAM, URETEROSCOPY AND STENT PLACEMENT Left 10/21/2014   Procedure: CYSTOSCOPY WITH RETROGRADE PYELOGRAM, URETEROSCOPY,BIOPSY AND STENT  CHANGE;  Surgeon: Steven Frock, MD;  [facility];  Service: Urology;  Laterality: Left;   CYSTOSCOPY/RETROGRADE/URETEROSCOPY Left 06/03/2014   Procedure: CYSTOSCOPY/RETROGRADE/URETEROSCOPY WITH BIOPSY,STENT EXCHANGE;  Surgeon: Steven Frock, MD;  [facility];  Service: Urology;  Laterality: Left;   ESOPHAGOGASTRODUODENOSCOPY N/A 09/28/2014   Procedure: ESOPHAGOGASTRODUODENOSCOPY (EGD);  Surgeon: Beryle Beams, MD;  Location: [facility];  Service: Endoscopy;  Laterality: N/A;   GASTRIC ROUX-EN-Y     '74   HERNIA REPAIR     bilateral hernia repairs    IR ANGIO INTRA EXTRACRAN SEL COM CAROTID INNOMINATE BILAT MOD SED  01/20/2021   IR ANGIO VERTEBRAL SEL SUBCLAVIAN INNOMINATE BILAT MOD SED  01/20/2021   IR US GUIDE VASC ACCESS RIGHT  01/20/2021   LAPAROSCOPIC APPENDECTOMY N/A 02/15/2020   Procedure: APPENDECTOMY LAPAROSCOPIC;  Surgeon: Jovita Kussmaul, MD;  [facility];  Service: General;  Laterality: N/A;   TONSILLECTOMY  as child   TOTAL HIP ARTHROPLASTY Left 05/19/2016   Procedure: LEFT TOTAL HIP ARTHROPLASTY ANTERIOR APPROACH;  Surgeon: Mcarthur Rossetti,  MD;  [facility];  Service: Orthopedics;  Laterality: Left;   TRANSURETHRAL RESECTION OF PROSTATE     VAGOTOMY  1950's    FAMILY HISTORY:  Family History  Problem Relation Age of Onset   Heart disease Neg Hx     SOCIAL HISTORY:  Social History   Socioeconomic History   Marital status: Married    Spouse name: Not on file   Number of children: Not on file   Years of education: Not on file   Highest education level: Not on file  Occupational History   Not on file  Tobacco Use   Smoking status: Former    Types: Cigarettes   Smokeless tobacco: Former    Types: Chew   Tobacco comments:    quit yrs ago  Electronics engineer Use   Vaping Use: Never used  Substance and Sexual Activity   Alcohol use: Not Currently   Drug use: No   Sexual activity: Not Currently  Other Topics Concern   Not on file  Social History  Narrative   Lives with wife and grandchildren   Right Handed   Drink 1-2 cups caffeine daily   Social Determinants of Health   Financial Resource Strain: Not on file  Food Insecurity: Not on file  Transportation Needs: Not on file  Physical Activity: Not on file  Stress: Not on file  Social Connections: Not on file  Intimate Partner Violence: Not on file    ALLERGIES: Bee venom  MEDICATIONS:  Current Outpatient Medications  Medication Sig Dispense Refill   abiraterone acetate (ZYTIGA) 250 MG tablet Take 1,000 mg by mouth daily.     acetaminophen (TYLENOL) 500 MG tablet Take 1 tablet (500 mg total) by mouth every 6 (six) hours as needed for mild pain. (Patient taking differently: Take 1,000 mg by mouth every 6 (six) hours as needed for mild pain.)     albuterol (VENTOLIN HFA) 108 (90 Base) MCG/ACT inhaler Inhale 2 puffs into the lungs every 6 (six) hours as needed for wheezing or shortness of breath.     Ascorbic Acid (VITAMIN C) 1000 MG tablet Take 1,000 mg by mouth daily.     aspirin EC 81 MG tablet Take 81 mg by mouth daily.     bicalutamide (CASODEX) 50 MG tablet Take 50 mg by mouth daily.     calcium carbonate (TUMS - DOSED IN MG ELEMENTAL CALCIUM) 500 MG chewable tablet Chew 1,000 mg by mouth daily as needed for indigestion or heartburn.     cyanocobalamin (,VITAMIN B-12,) 1000 MCG/ML injection Inject 1,000 mcg into the muscle every 30 (thirty) days.     Denosumab (XGEVA [place]) Inject 1 Dose into the skin every 28 (twenty-eight) days.     glycerin adult 2 g suppository Place 1 suppository rectally as needed for constipation.     guaiFENesin (MUCINEX) 600 MG 12 hr tablet Take 1,200 mg by mouth 2 (two) times daily.      hydroxypropyl methylcellulose / hypromellose (ISOPTO TEARS / GONIOVISC) 2.5 % ophthalmic solution Place 1 drop into both eyes daily as needed for dry eyes.     leuprolide, 6 Month, (ELIGARD) 45 MG injection Inject 45 mg into the skin every 6 (six) months.      MAGNESIUM PO Take 400 mg by mouth daily.     meclizine (ANTIVERT) 25 MG tablet Take 25 mg by mouth See admin instructions. Take 25 mg daily, may take a second 25 mg dose as needed for dizziness  Misc Natural Products (NEURIVA PO) Take 1 capsule by mouth daily.     Multiple Vitamin (MULTIVITAMIN WITH MINERALS) TABS tablet Take 1 tablet by mouth every morning.     OVER THE COUNTER MEDICATION Apply 1 application topically daily as needed (pain). Hempvana otc pain relieving cream     PARoxetine (PAXIL) 40 MG tablet Take 1 tablet (40 mg total) by mouth daily. (Patient taking differently: Take 40 mg by mouth at bedtime.) 30 tablet 11   predniSONE (DELTASONE) 5 MG tablet Take 5 mg by mouth daily.     Psyllium (METAMUCIL PO) Take 1 Dose by mouth daily. 1 dose = 3 teaspoonful     senna-docusate (SENOKOT-S) 8.6-50 MG tablet Take 1 tablet by mouth at bedtime. (Patient taking differently: Take 1 tablet by mouth at bedtime as needed for mild constipation.) 30 tablet 0   traZODone (DESYREL) 50 MG tablet Take 50 mg by mouth at bedtime as needed for sleep.     VITAMIN E PO Take 400 Units by mouth every Wednesday.     XTANDI 40 MG tablet Take 80 mg by mouth 2 (two) times daily.     No current facility-administered medications for this encounter.    REVIEW OF SYSTEMS:  On review of systems, the patient reports that he is doing well overall. He denies any chest pain, shortness of breath, cough, fevers, chills, night sweats, unintended weight changes. He denies abdominal pain, nausea or vomiting. He reports constipation, managed with laxatives/stool softeners. He denies any new musculoskeletal or joint aches or pains. His IPSS was 14, indicating moderate urinary symptoms. He notes urinary incontinence. A complete review of systems is obtained and is otherwise negative.  We did talk about possible skeletal pain from bone metastases.  The patient and his wife deny any significant pain.  He rarely gets OA pain and  takes tylenol with complete relief.    PHYSICAL EXAM:  Wt Readings from Last 3 Encounters:  02/21/22 169 lb 9.6 oz (76.9 kg)  02/21/22 169 lb 9.6 oz (76.9 kg)  04/29/21 160 lb (72.6 kg)   Temp Readings from Last 3 Encounters:  02/21/22 98.3 F (36.8 C) (Oral)  02/21/22 98.3 F (36.8 C)  04/29/21 98.4 F (36.9 C)   BP Readings from Last 3 Encounters:  02/21/22 (!) 159/79  02/21/22 (!) 159/79  08/22/21 140/80   Pulse Readings from Last 3 Encounters:  02/21/22 87  02/21/22 87  08/22/21 81   Pain Assessment Pain Score: 0-No pain/10  In general this is a well appearing male in no acute distress. He's alert and oriented x4 and appropriate throughout the examination. Cardiopulmonary assessment is negative for acute distress, and he exhibits normal effort.     KPS = 90  100 - Normal; no complaints; no evidence of disease. 90   - Able to carry on normal activity; minor signs or symptoms of disease. 80   - Normal activity with effort; some signs or symptoms of disease. 68   - Cares for self; unable to carry on normal activity or to do active work. 60   - Requires occasional assistance, but is able to care for most of his personal needs. 50   - Requires considerable assistance and frequent medical care. 86   - Disabled; requires special care and assistance. 58   - Severely disabled; hospital admission is indicated although death not imminent. 84   - Very sick; hospital admission necessary; active supportive treatment necessary. 10   - Moribund; fatal  processes progressing rapidly. 0     - Dead  Karnofsky DA, Abelmann [place], Craver LS and Burchenal [facility] [phone]) The use of the nitrogen mustards in the palliative treatment of carcinoma: with particular reference to bronchogenic carcinoma Cancer 1 634-56  LABORATORY DATA:  Lab Results  Component Value Date   WBC 10.6 (H) 04/27/2021   HGB 12.9 (L) 04/27/2021   HCT 40.7 04/27/2021   MCV 89.3 04/27/2021   PLT 259 04/27/2021   Lab  Results  Component Value Date   NA 135 04/27/2021   K 4.7 04/27/2021   CL 101 04/27/2021   CO2 26 04/27/2021   Lab Results  Component Value Date   ALT 9 10/10/2020   AST 14 (L) 10/10/2020   ALKPHOS 54 10/10/2020   BILITOT 0.7 10/10/2020     RADIOGRAPHY: NM Bone Scan Whole Body  Result Date: 02/03/2022 CLINICAL DATA:  Prostate cancer, fell 1 week ago striking frontal calvarium EXAM: NUCLEAR MEDICINE WHOLE BODY BONE SCAN TECHNIQUE: Whole body anterior and posterior images were obtained approximately 3 hours after intravenous injection of radiopharmaceutical. RADIOPHARMACEUTICALS:  20.1 mCi Technetium-66mMDP IV COMPARISON:  06/24/2020 Correlation: CT abdomen and pelvis 02/03/2022 FINDINGS: Multiple sites of abnormal tracer uptake are seen consistent with osseous metastatic disease, progressive since previous exam. These include thoracic spine, BILATERAL ribs, sternum, LEFT scapula, RIGHT clavicular head, pelvis, RIGHT humerus, and proximal LEFT femur. Mildly increased LEFT hydronephrosis corresponding to CT finding. Otherwise expected urinary tract and soft tissue distribution of tracer. IMPRESSION: Progressive osseous metastatic disease. Mild increase in LEFT hydronephrosis since prior bone scan. Electronically Signed   By: MLavonia DanaM.D.   On: 02/03/2022 17:02      IMPRESSION/PLAN: 1. 88y.o. gentleman with widespread osseous metastatic disease from prostate cancer Today, we talked to the patient and family about the findings and workup thus far. We discussed the natural history of metastatic prostate cancer and general treatment, highlighting the role of Xogifo infusions in the management. We focused on the details of logistics and delivery. The recommendation is to proceed with monthly infusions of Xofigo x6. We will monitor labs prior to each infusion to ensure it is safe to proceed with each treatment. We reviewed the anticipated acute and late sequelae associated with Xofigo in this  setting. The patient was encouraged to ask questions that were answered to his satisfaction.  At the end of our conversation, the patient elects to proceed with Xofigo infusions. We will share this information with Dr. MTresa Moore We will proceed with treatment planning accordingly and anticipate beginning treatment in the near future.  I'll obtain CBC today to verify appropriate levels for Xofigo.  We personally spent 60 minutes in this encounter including chart review, reviewing radiological studies, meeting face-to-face with the patient, entering orders and completing documentation.   ------------------------------------------------   [place]yler Pita MD [place] [phone] Fax: [phone]  Skype  LinkedIn   This document serves as a record of services personally performed by MTyler Pita MD. It was created on his behalf by KWilburn Mylar a trained medical scribe. The creation of this record is based on the scribe's personal observations and the provider's statements to them. This document has been checked and approved by the attending provider.

## 2022-02-21 NOTE — Telephone Encounter (Signed)
RN called patient about lab results left voicemail.

## 2022-02-21 NOTE — Progress Notes (Signed)
  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  His most recent blood counts show that he remains a good candidate to proceed with Ra-223.  The patient is going to receive Xofigo for his treatment.   Radiation Treatment Planning:  The prescribed radiation activity will be 50 kBq per kg per infusions. The plan is to offer a total of 6 IV administrations of this agent, assuming the blood counts are adequate prior to each administration, with each infusion done at 4 week intervals.  This will be done as an IV administration in the nuclear medicine department, with care to undertake all radiation protection precautions as recommended.   ________________________________  Sheral Apley. Tammi Klippel, M.D.

## 2022-02-21 NOTE — Telephone Encounter (Signed)
CALLED PATIENT TO ASK QUESTIONS, LVM FOR A RETURN CALL 

## 2022-02-21 NOTE — Progress Notes (Signed)
Introduced myself to the patient, and his wife Steven Cain, as the prostate nurse navigator.  No barriers to care identified at this time.  He is here to discuss his radiation treatment options.  I gave him my business card and asked him to call me with questions or concerns.  Verbalized understanding.

## 2022-02-22 DIAGNOSIS — C61 Malignant neoplasm of prostate: Secondary | ICD-10-CM | POA: Diagnosis not present

## 2022-02-22 DIAGNOSIS — C7951 Secondary malignant neoplasm of bone: Secondary | ICD-10-CM | POA: Diagnosis not present

## 2022-02-23 ENCOUNTER — Telehealth: Payer: Self-pay | Admitting: *Deleted

## 2022-02-23 NOTE — Telephone Encounter (Signed)
CALLED PATIENT TO REMIND OF LAB AND WEIGHT FOR 02-24-22 - ARRIVAL TIME- 11:45 AM @ [facility], LVM FOR A RETURN CALL

## 2022-02-24 ENCOUNTER — Ambulatory Visit
Admission: RE | Admit: 2022-02-24 | Discharge: 2022-02-24 | Disposition: A | Discharge status: Home / Self Care | Payer: Medicare Other | Source: Ambulatory Visit | Attending: Radiation Oncology | Admitting: Radiation Oncology

## 2022-02-24 ENCOUNTER — Other Ambulatory Visit: Payer: Self-pay

## 2022-02-24 DIAGNOSIS — C61 Malignant neoplasm of prostate: Secondary | ICD-10-CM

## 2022-02-24 DIAGNOSIS — C7951 Secondary malignant neoplasm of bone: Secondary | ICD-10-CM | POA: Diagnosis not present

## 2022-02-24 LAB — CBC WITH DIFFERENTIAL (CANCER CENTER ONLY)
Abs Immature Granulocytes: 0.03 10*3/uL (ref 0.00–0.07)
Basophils Absolute: 0.1 10*3/uL (ref 0.0–0.1)
Basophils Relative: 1 %
Eosinophils Absolute: 0.1 10*3/uL (ref 0.0–0.5)
Eosinophils Relative: 2 %
HCT: 38.9 % — ABNORMAL LOW (ref 39.0–52.0)
Hemoglobin: 12.5 g/dL — ABNORMAL LOW (ref 13.0–17.0)
Immature Granulocytes: 0 %
Lymphocytes Relative: 26 %
Lymphs Abs: 1.8 10*3/uL (ref 0.7–4.0)
MCH: 28.9 pg (ref 26.0–34.0)
MCHC: 32.1 g/dL (ref 30.0–36.0)
MCV: 89.8 fL (ref 80.0–100.0)
Monocytes Absolute: 0.5 10*3/uL (ref 0.1–1.0)
Monocytes Relative: 7 %
Neutro Abs: 4.6 10*3/uL (ref 1.7–7.7)
Neutrophils Relative %: 64 %
Platelet Count: 273 10*3/uL (ref 150–400)
RBC: 4.33 MIL/uL (ref 4.22–5.81)
RDW: 14.9 % (ref 11.5–15.5)
WBC Count: 7.2 10*3/uL (ref 4.0–10.5)
nRBC: 0 % (ref 0.0–0.2)

## 2022-03-02 ENCOUNTER — Telehealth: Payer: Self-pay | Admitting: *Deleted

## 2022-03-02 DIAGNOSIS — Z87891 Personal history of nicotine dependence: Secondary | ICD-10-CM | POA: Diagnosis not present

## 2022-03-02 DIAGNOSIS — H93293 Other abnormal auditory perceptions, bilateral: Secondary | ICD-10-CM | POA: Diagnosis not present

## 2022-03-02 DIAGNOSIS — H6123 Impacted cerumen, bilateral: Secondary | ICD-10-CM | POA: Diagnosis not present

## 2022-03-02 NOTE — Telephone Encounter (Signed)
CALLED PATIENT TO REMIND OF XOFIGO INJ. FOR 03-03-22- ARRIVAL TIME- 11:45 AM @ [facility], LVM FOR A RETURN CALL

## 2022-03-03 ENCOUNTER — Ambulatory Visit (HOSPITAL_COMMUNITY)
Admission: RE | Admit: 2022-03-03 | Discharge: 2022-03-03 | Disposition: A | Discharge status: Home / Self Care | Payer: Medicare Other | Source: Ambulatory Visit | Attending: Radiation Oncology | Admitting: Radiation Oncology

## 2022-03-03 DIAGNOSIS — Z192 Hormone resistant malignancy status: Secondary | ICD-10-CM | POA: Diagnosis not present

## 2022-03-03 DIAGNOSIS — C7951 Secondary malignant neoplasm of bone: Secondary | ICD-10-CM | POA: Diagnosis not present

## 2022-03-03 DIAGNOSIS — C61 Malignant neoplasm of prostate: Secondary | ICD-10-CM | POA: Diagnosis not present

## 2022-03-03 MED ORDER — RADIUM RA 223 DICHLORIDE 30 MCCI/ML IV SOLN
111.7000 | Freq: Once | INTRAVENOUS | Status: AC | PRN
  Administered 2022-03-03: 111.7 via INTRAVENOUS

## 2022-03-03 NOTE — Progress Notes (Signed)
  Radiation Oncology         [phone]) [phone] ________________________________  Name: Steven Cain MRN: [id number]  Date: 03/03/2022  DOB: [birth year]  Radium-223 Infusion Note  Diagnosis:  Castration resistant prostate cancer with painful bone involvement  Current Infusion:    1  Planned Infusions:  6  Narrative: Steven Cain presented to nuclear medicine for treatment. His most recent blood counts were reviewed.  He remains a good candidate to proceed with Ra-223.  The patient was situated in an infusion suite with a contact barrier placed under his arm. Intravenous access was established, using sterile technique, and a normal saline infusion from a syringe was started.  Micro-dosimetry:  The prescribed radiation activity was assayed and confirmed to be within specified tolerance.  Special Treatment Procedure - Infusion:  The nuclear medicine technologist and I personally verified the dose activity to be delivered as specified in the written directive, and verified the patient identification via 2 separate methods.  The syringe containing the dose was attached to an intravenous access and the dose delivered over a minute. No complications were noted.  The total administered dose was 115.6 microcuries.   A saline flush of the line and the syringe that contained the isotope was then performed.  The residual radioactivity in the syringe was 3.9 microcuries, so the actual infused isotope activity was 111.7 microcuries.   Pressure was applied to the venipuncture site, and a compression bandage placed.   Radiation Safety personnel were present to perform the discharge survey, as detailed on their documentation.   After a short period of observation, the patient had his IV removed.  Impression:  The patient tolerated his infusion relatively well.  Plan:  The patient will return in one month for ongoing care.    ________________________________  Sheral Apley. Tammi Klippel, M.D.

## 2022-03-03 NOTE — Addendum Note (Signed)
Encounter addended by: Leim Fabry L on: 03/03/2022 1:30 PM  Actions taken: Imaging Exam begun, LDA properties accepted, Order list changed

## 2022-03-03 NOTE — Addendum Note (Signed)
Encounter addended by: Leim Fabry L on: 03/03/2022 1:40 PM  Actions taken: Imaging Exam ended

## 2022-03-09 ENCOUNTER — Telehealth: Payer: Self-pay | Admitting: *Deleted

## 2022-03-09 ENCOUNTER — Other Ambulatory Visit: Payer: Self-pay

## 2022-03-09 DIAGNOSIS — C7951 Secondary malignant neoplasm of bone: Secondary | ICD-10-CM

## 2022-03-09 DIAGNOSIS — E538 Deficiency of other specified B group vitamins: Secondary | ICD-10-CM | POA: Diagnosis not present

## 2022-03-09 DIAGNOSIS — E291 Testicular hypofunction: Secondary | ICD-10-CM | POA: Diagnosis not present

## 2022-03-09 NOTE — Telephone Encounter (Signed)
CALLED PATIENT TO INFORM OF LAB AND WEIGHT ON 03-30-22 @ 12 PM @ [place]. ON 04-06-22 - ARRIVAL TIME- 11:45 AM @ [facility], SPOKE WITH PATIENT'S WIFE- [name] AND SHE IS AWARE OF THESE APPTS.

## 2022-03-09 NOTE — Progress Notes (Signed)
Per Dr. Tammi Klippel orders.

## 2022-03-16 DIAGNOSIS — C7951 Secondary malignant neoplasm of bone: Secondary | ICD-10-CM | POA: Diagnosis not present

## 2022-03-16 DIAGNOSIS — Z0000 Encounter for general adult medical examination without abnormal findings: Secondary | ICD-10-CM | POA: Diagnosis not present

## 2022-03-16 DIAGNOSIS — D692 Other nonthrombocytopenic purpura: Secondary | ICD-10-CM | POA: Diagnosis not present

## 2022-03-16 DIAGNOSIS — R001 Bradycardia, unspecified: Secondary | ICD-10-CM | POA: Diagnosis not present

## 2022-03-16 DIAGNOSIS — J42 Unspecified chronic bronchitis: Secondary | ICD-10-CM | POA: Diagnosis not present

## 2022-03-16 DIAGNOSIS — C61 Malignant neoplasm of prostate: Secondary | ICD-10-CM | POA: Diagnosis not present

## 2022-03-16 DIAGNOSIS — I441 Atrioventricular block, second degree: Secondary | ICD-10-CM | POA: Diagnosis not present

## 2022-03-16 DIAGNOSIS — R55 Syncope and collapse: Secondary | ICD-10-CM | POA: Diagnosis not present

## 2022-03-16 DIAGNOSIS — I700 Atherosclerosis of aorta: Secondary | ICD-10-CM | POA: Diagnosis not present

## 2022-03-16 DIAGNOSIS — I951 Orthostatic hypotension: Secondary | ICD-10-CM | POA: Diagnosis not present

## 2022-03-16 DIAGNOSIS — I129 Hypertensive chronic kidney disease with stage 1 through stage 4 chronic kidney disease, or unspecified chronic kidney disease: Secondary | ICD-10-CM | POA: Diagnosis not present

## 2022-03-16 DIAGNOSIS — N1832 Chronic kidney disease, stage 3b: Secondary | ICD-10-CM | POA: Diagnosis not present

## 2022-03-18 DIAGNOSIS — F325 Major depressive disorder, single episode, in full remission: Secondary | ICD-10-CM | POA: Diagnosis not present

## 2022-03-18 DIAGNOSIS — E291 Testicular hypofunction: Secondary | ICD-10-CM | POA: Diagnosis not present

## 2022-03-18 DIAGNOSIS — I129 Hypertensive chronic kidney disease with stage 1 through stage 4 chronic kidney disease, or unspecified chronic kidney disease: Secondary | ICD-10-CM | POA: Diagnosis not present

## 2022-03-22 DIAGNOSIS — C7951 Secondary malignant neoplasm of bone: Secondary | ICD-10-CM | POA: Diagnosis not present

## 2022-03-22 DIAGNOSIS — C61 Malignant neoplasm of prostate: Secondary | ICD-10-CM | POA: Diagnosis not present

## 2022-03-29 ENCOUNTER — Telehealth: Payer: Self-pay | Admitting: *Deleted

## 2022-03-29 NOTE — Telephone Encounter (Signed)
Called patient to remind of lab and weight appt. for 03-30-22 @ 12 pm @ [place], lvm for a return call

## 2022-03-30 ENCOUNTER — Other Ambulatory Visit: Payer: Self-pay

## 2022-03-30 ENCOUNTER — Ambulatory Visit
Admission: RE | Admit: 2022-03-30 | Discharge: 2022-03-30 | Disposition: A | Discharge status: Home / Self Care | Payer: Medicare Other | Source: Ambulatory Visit | Attending: Radiation Oncology | Admitting: Radiation Oncology

## 2022-03-30 DIAGNOSIS — C61 Malignant neoplasm of prostate: Secondary | ICD-10-CM | POA: Diagnosis not present

## 2022-03-30 DIAGNOSIS — C7951 Secondary malignant neoplasm of bone: Secondary | ICD-10-CM | POA: Diagnosis not present

## 2022-03-30 LAB — CBC WITH DIFFERENTIAL (CANCER CENTER ONLY)
Abs Immature Granulocytes: 0.02 10*3/uL (ref 0.00–0.07)
Basophils Absolute: 0.1 10*3/uL (ref 0.0–0.1)
Basophils Relative: 1 %
Eosinophils Absolute: 0.1 10*3/uL (ref 0.0–0.5)
Eosinophils Relative: 2 %
HCT: 37 % — ABNORMAL LOW (ref 39.0–52.0)
Hemoglobin: 12.2 g/dL — ABNORMAL LOW (ref 13.0–17.0)
Immature Granulocytes: 0 %
Lymphocytes Relative: 29 %
Lymphs Abs: 2.2 10*3/uL (ref 0.7–4.0)
MCH: 29 pg (ref 26.0–34.0)
MCHC: 33 g/dL (ref 30.0–36.0)
MCV: 88.1 fL (ref 80.0–100.0)
Monocytes Absolute: 0.6 10*3/uL (ref 0.1–1.0)
Monocytes Relative: 8 %
Neutro Abs: 4.5 10*3/uL (ref 1.7–7.7)
Neutrophils Relative %: 60 %
Platelet Count: 224 10*3/uL (ref 150–400)
RBC: 4.2 MIL/uL — ABNORMAL LOW (ref 4.22–5.81)
RDW: 15.6 % — ABNORMAL HIGH (ref 11.5–15.5)
WBC Count: 7.6 10*3/uL (ref 4.0–10.5)
nRBC: 0 % (ref 0.0–0.2)

## 2022-04-05 ENCOUNTER — Telehealth: Payer: Self-pay | Admitting: *Deleted

## 2022-04-05 NOTE — Telephone Encounter (Signed)
CALLED PATIENT TO REMIND OF XOFIGO INJ. FOR 04-06-22- ARRIVAL TIME- 11:45 AM @ [place], LVM FOR A RETURN CALL

## 2022-04-06 ENCOUNTER — Encounter (HOSPITAL_COMMUNITY)
Admission: RE | Admit: 2022-04-06 | Discharge: 2022-04-06 | Disposition: A | Discharge status: Home / Self Care | Payer: Medicare Other | Source: Ambulatory Visit | Attending: Radiation Oncology | Admitting: Radiation Oncology

## 2022-04-06 DIAGNOSIS — C61 Malignant neoplasm of prostate: Secondary | ICD-10-CM | POA: Diagnosis not present

## 2022-04-06 DIAGNOSIS — C7951 Secondary malignant neoplasm of bone: Secondary | ICD-10-CM | POA: Diagnosis not present

## 2022-04-06 DIAGNOSIS — Z192 Hormone resistant malignancy status: Secondary | ICD-10-CM | POA: Diagnosis not present

## 2022-04-06 MED ORDER — RADIUM RA 223 DICHLORIDE 30 MCCI/ML IV SOLN
120.0000 | Freq: Once | INTRAVENOUS | Status: AC | PRN
  Administered 2022-04-06: 120 via INTRAVENOUS

## 2022-04-06 NOTE — Progress Notes (Signed)
  Radiation Oncology         [phone]) [phone] ________________________________  Name: Steven Cain MRN: [id number]  Date: 04/06/2022  DOB: 02-14-1931  Radium-223 Infusion Note  Diagnosis:  Castration resistant prostate cancer with painful bone involvement  Current Infusion:    2  Planned Infusions:  6  Narrative: Steven Cain presented to nuclear medicine for treatment. His most recent blood counts were reviewed.  He remains a good candidate to proceed with Ra-223.  The patient was situated in an infusion suite with a contact barrier placed under his arm. Intravenous access was established, using sterile technique, and a normal saline infusion from a syringe was started.  Micro-dosimetry:  The prescribed radiation activity was assayed and confirmed to be within specified tolerance.  Special Treatment Procedure - Infusion:  The nuclear medicine technologist and I personally verified the dose activity to be delivered as specified in the written directive, and verified the patient identification via 2 separate methods.  The syringe containing the dose was attached to an intravenous access and the dose delivered over a minute. No complications were noted.  The total administered dose was 121.8 microcuries.   A saline flush of the line and the syringe that contained the isotope was then performed.  The residual radioactivity in the syringe was 1.8 microcuries, so the actual infused isotope activity was 120 microcuries.   Pressure was applied to the venipuncture site, and a compression bandage placed.   Radiation Safety personnel were present to perform the discharge survey, as detailed on their documentation.   After a short period of observation, the patient had his IV removed.  Impression:  The patient tolerated his infusion relatively well.  Plan:  The patient will return in one month for ongoing care.    ________________________________  Sheral Apley. Tammi Klippel, M.D.

## 2022-04-06 NOTE — Addendum Note (Signed)
Encounter addended by: Maricela Curet, RT on: 04/06/2022 12:51 PM  Actions taken: Imaging Exam begun, Order list changed, MAR administration accepted

## 2022-04-06 NOTE — Addendum Note (Signed)
Encounter addended by: Maricela Curet, RT on: 04/06/2022 12:54 PM  Actions taken: Imaging Exam ended, Charge Capture section accepted

## 2022-04-11 ENCOUNTER — Telehealth: Payer: Self-pay | Admitting: *Deleted

## 2022-04-11 ENCOUNTER — Other Ambulatory Visit: Payer: Self-pay

## 2022-04-11 DIAGNOSIS — C61 Malignant neoplasm of prostate: Secondary | ICD-10-CM

## 2022-04-11 NOTE — Telephone Encounter (Signed)
CALLED PATIENT TO INFORM OF LAB AND WEIGHT APPT. ON 05-05-22 @ 12 PM @ [place]. ON 05-12-22 - ARRIVAL TIME- 11:45 AM @ [facility], SPOKE WITH MS. Fulwider (WIFE) AND SHE IS AWARE OF THESE APPTS.

## 2022-04-11 NOTE — Telephone Encounter (Signed)
CALLED PATIENT TO INFORM OF LABS AND WEIGHT ON 05-05-22 AND HIS XOFIGO INJ. ON 05-12-22, SPOKE WITH PATIENT'S WIFE- [name] Gillihan AND SHE IS AWARE OF THESE APPTS.

## 2022-04-11 NOTE — Progress Notes (Signed)
Per Dr. Manning. 

## 2022-04-11 NOTE — Progress Notes (Signed)
RN spoke with patient's wife, Lou Miner, to assess any needs after starting treatment.    No barriers or questions at this time.  RN encouraged patient and wife to call if any questions or concerns came up.  Verbalized understanding and agreement.

## 2022-04-13 NOTE — Addendum Note (Signed)
Encounter addended by: Valinda Hoar on: 04/13/2022 5:23 PM  Actions taken: Imaging Exam ended

## 2022-04-19 DIAGNOSIS — C61 Malignant neoplasm of prostate: Secondary | ICD-10-CM | POA: Diagnosis not present

## 2022-04-19 DIAGNOSIS — R300 Dysuria: Secondary | ICD-10-CM | POA: Diagnosis not present

## 2022-04-19 DIAGNOSIS — C7951 Secondary malignant neoplasm of bone: Secondary | ICD-10-CM | POA: Diagnosis not present

## 2022-04-27 ENCOUNTER — Ambulatory Visit: Payer: Medicare Other | Admitting: Podiatry

## 2022-05-03 ENCOUNTER — Telehealth: Payer: Self-pay | Admitting: *Deleted

## 2022-05-03 NOTE — Telephone Encounter (Signed)
CALLED PATIENT TO REMIND OF LAB AND WEIGHT APPT. FOR 05-05-22 @ 12 PM @ [place], SPOKE WITH PATIENT'S WIFE-[name] AND SHE IS AWARE OF THIS APPT.

## 2022-05-05 ENCOUNTER — Ambulatory Visit
Admission: RE | Admit: 2022-05-05 | Discharge: 2022-05-05 | Disposition: A | Discharge status: Home / Self Care | Payer: Medicare Other | Source: Ambulatory Visit | Attending: Radiation Oncology | Admitting: Radiation Oncology

## 2022-05-05 ENCOUNTER — Other Ambulatory Visit: Payer: Self-pay

## 2022-05-05 DIAGNOSIS — C61 Malignant neoplasm of prostate: Secondary | ICD-10-CM | POA: Diagnosis not present

## 2022-05-05 LAB — CBC WITH DIFFERENTIAL (CANCER CENTER ONLY)
Abs Immature Granulocytes: 0.03 10*3/uL (ref 0.00–0.07)
Basophils Absolute: 0.1 10*3/uL (ref 0.0–0.1)
Basophils Relative: 1 %
Eosinophils Absolute: 0.2 10*3/uL (ref 0.0–0.5)
Eosinophils Relative: 2 %
HCT: 34.2 % — ABNORMAL LOW (ref 39.0–52.0)
Hemoglobin: 11.1 g/dL — ABNORMAL LOW (ref 13.0–17.0)
Immature Granulocytes: 0 %
Lymphocytes Relative: 22 %
Lymphs Abs: 2 10*3/uL (ref 0.7–4.0)
MCH: 29.9 pg (ref 26.0–34.0)
MCHC: 32.5 g/dL (ref 30.0–36.0)
MCV: 92.2 fL (ref 80.0–100.0)
Monocytes Absolute: 0.6 10*3/uL (ref 0.1–1.0)
Monocytes Relative: 6 %
Neutro Abs: 6.1 10*3/uL (ref 1.7–7.7)
Neutrophils Relative %: 69 %
Platelet Count: 217 10*3/uL (ref 150–400)
RBC: 3.71 MIL/uL — ABNORMAL LOW (ref 4.22–5.81)
RDW: 17.4 % — ABNORMAL HIGH (ref 11.5–15.5)
WBC Count: 8.9 10*3/uL (ref 4.0–10.5)
nRBC: 0 % (ref 0.0–0.2)

## 2022-05-11 ENCOUNTER — Telehealth: Payer: Self-pay | Admitting: *Deleted

## 2022-05-11 DIAGNOSIS — C61 Malignant neoplasm of prostate: Secondary | ICD-10-CM | POA: Diagnosis not present

## 2022-05-11 NOTE — Telephone Encounter (Signed)
CALLED PATIENT TO INFORM OF XOFIGO INJ. FOR 05-12-22- ARRIVAL TIME- 11:45 AM @ [facility], SPOKE WITH PATIENT'S WIFE - [name] AND HE IS AWARE OF THIS APPT.

## 2022-05-12 ENCOUNTER — Encounter (HOSPITAL_COMMUNITY)
Admission: RE | Admit: 2022-05-12 | Discharge: 2022-05-12 | Disposition: A | Discharge status: Home / Self Care | Payer: Medicare Other | Source: Ambulatory Visit | Attending: Radiation Oncology | Admitting: Radiation Oncology

## 2022-05-12 DIAGNOSIS — C61 Malignant neoplasm of prostate: Secondary | ICD-10-CM | POA: Diagnosis not present

## 2022-05-12 DIAGNOSIS — C7951 Secondary malignant neoplasm of bone: Secondary | ICD-10-CM

## 2022-05-12 DIAGNOSIS — Z192 Hormone resistant malignancy status: Secondary | ICD-10-CM | POA: Diagnosis not present

## 2022-05-12 MED ORDER — RADIUM RA 223 DICHLORIDE 30 MCCI/ML IV SOLN
118.6300 | Freq: Once | INTRAVENOUS | Status: AC
  Administered 2022-05-12: 118.63 via INTRAVENOUS

## 2022-05-12 NOTE — Progress Notes (Signed)
  Radiation Oncology         [phone]) [phone] ________________________________  Name: Steven Cain MRN: [id number]  Date: 05/12/2022  DOB: 09-15-1930  Radium-223 Infusion Note  Diagnosis:  Castration resistant prostate cancer with painful bone involvement  Current Infusion:    3  Planned Infusions:  6  Narrative: [name] presented to nuclear medicine for treatment. His most recent blood counts were reviewed.  He remains a good candidate to proceed with Ra-223.  The patient was situated in an infusion suite with a contact barrier placed under his arm. Intravenous access was established, using sterile technique, and a normal saline infusion from a syringe was started.  Micro-dosimetry:  The prescribed radiation activity was assayed and confirmed to be within specified tolerance.  Special Treatment Procedure - Infusion:  The nuclear medicine technologist and I personally verified the dose activity to be delivered as specified in the written directive, and verified the patient identification via 2 separate methods.  The syringe containing the dose was attached to an intravenous access and the dose delivered over a minute. No complications were noted.  The total administered dose was 123.0 microcuries.   A saline flush of the line and the syringe that contained the isotope was then performed.  The residual radioactivity in the syringe was 4.37 microcuries, so the actual infused isotope activity was 128.63 microcuries.   Pressure was applied to the venipuncture site, and a compression bandage placed.   Radiation Safety personnel were present to perform the discharge survey, as detailed on their documentation.   After a short period of observation, the patient had his IV removed.  Impression:  The patient tolerated his infusion relatively well.  Plan:  The patient will return in one month for ongoing care.    ________________________________  Sheral Apley. Tammi Klippel, M.D.

## 2022-05-12 NOTE — Addendum Note (Signed)
Encounter addended by: Rondel Baton, RT on: 05/12/2022 1:05 PM  Actions taken: Imaging Exam begun, Order list changed, MAR administration accepted

## 2022-05-12 NOTE — Addendum Note (Signed)
Encounter addended by: Rondel Baton, RT on: 05/12/2022 1:14 PM  Actions taken: Imaging Exam ended, Charge Capture section accepted

## 2022-05-18 DIAGNOSIS — C7951 Secondary malignant neoplasm of bone: Secondary | ICD-10-CM | POA: Diagnosis not present

## 2022-05-18 DIAGNOSIS — C61 Malignant neoplasm of prostate: Secondary | ICD-10-CM | POA: Diagnosis not present

## 2022-05-25 ENCOUNTER — Telehealth: Payer: Self-pay | Admitting: *Deleted

## 2022-05-25 ENCOUNTER — Other Ambulatory Visit: Payer: Self-pay

## 2022-05-25 DIAGNOSIS — C61 Malignant neoplasm of prostate: Secondary | ICD-10-CM

## 2022-05-25 NOTE — Progress Notes (Signed)
Per Dr. Manning. 

## 2022-05-25 NOTE — Telephone Encounter (Signed)
CALLED PATIENT TO INFORM OF LAB AND WEIGHT APPT. FOR 06-02-22 AND HIS XOFIGO INJ. FOR 06-09-22, LVM FOR A RETURN CALL

## 2022-06-01 ENCOUNTER — Telehealth: Payer: Self-pay | Admitting: *Deleted

## 2022-06-01 NOTE — Telephone Encounter (Signed)
CALLED PATIENT TO REMIND OF LAB AND WEIGHT APPT. FOR 06-02-22 @ 12 PM, SPOKE WITH PATIENT'S WIFE - [name] AND SHE IS AWARE OF THIS APPT.

## 2022-06-02 ENCOUNTER — Other Ambulatory Visit: Payer: Self-pay

## 2022-06-02 ENCOUNTER — Ambulatory Visit
Admission: RE | Admit: 2022-06-02 | Discharge: 2022-06-02 | Disposition: A | Discharge status: Home / Self Care | Payer: Medicare Other | Source: Ambulatory Visit | Attending: Radiation Oncology | Admitting: Radiation Oncology

## 2022-06-02 DIAGNOSIS — C61 Malignant neoplasm of prostate: Secondary | ICD-10-CM | POA: Diagnosis not present

## 2022-06-02 LAB — CBC WITH DIFFERENTIAL (CANCER CENTER ONLY)
Abs Immature Granulocytes: 0.02 10*3/uL (ref 0.00–0.07)
Basophils Absolute: 0.1 10*3/uL (ref 0.0–0.1)
Basophils Relative: 1 %
Eosinophils Absolute: 0.4 10*3/uL (ref 0.0–0.5)
Eosinophils Relative: 4 %
HCT: 38.6 % — ABNORMAL LOW (ref 39.0–52.0)
Hemoglobin: 12.9 g/dL — ABNORMAL LOW (ref 13.0–17.0)
Immature Granulocytes: 0 %
Lymphocytes Relative: 27 %
Lymphs Abs: 2.3 10*3/uL (ref 0.7–4.0)
MCH: 30.6 pg (ref 26.0–34.0)
MCHC: 33.4 g/dL (ref 30.0–36.0)
MCV: 91.5 fL (ref 80.0–100.0)
Monocytes Absolute: 0.5 10*3/uL (ref 0.1–1.0)
Monocytes Relative: 6 %
Neutro Abs: 5.2 10*3/uL (ref 1.7–7.7)
Neutrophils Relative %: 62 %
Platelet Count: 209 10*3/uL (ref 150–400)
RBC: 4.22 MIL/uL (ref 4.22–5.81)
RDW: 15.9 % — ABNORMAL HIGH (ref 11.5–15.5)
WBC Count: 8.5 10*3/uL (ref 4.0–10.5)
nRBC: 0 % (ref 0.0–0.2)

## 2022-06-08 ENCOUNTER — Telehealth: Payer: Self-pay | Admitting: *Deleted

## 2022-06-08 NOTE — Telephone Encounter (Signed)
Called patient to remind of Xofigo inj. for 06-09-22- arrival time- 11:45 am @ [facility], spoke with patient's wife- Steven Cain and she is aware of this inj.

## 2022-06-09 ENCOUNTER — Ambulatory Visit (HOSPITAL_COMMUNITY)
Admission: RE | Admit: 2022-06-09 | Discharge: 2022-06-09 | Disposition: A | Discharge status: Home / Self Care | Payer: Medicare Other | Source: Ambulatory Visit | Attending: Radiation Oncology | Admitting: Radiation Oncology

## 2022-06-09 DIAGNOSIS — C61 Malignant neoplasm of prostate: Secondary | ICD-10-CM | POA: Diagnosis not present

## 2022-06-09 DIAGNOSIS — Z192 Hormone resistant malignancy status: Secondary | ICD-10-CM | POA: Diagnosis not present

## 2022-06-09 DIAGNOSIS — C7951 Secondary malignant neoplasm of bone: Secondary | ICD-10-CM | POA: Diagnosis not present

## 2022-06-09 MED ORDER — RADIUM RA 223 DICHLORIDE 30 MCCI/ML IV SOLN
115.7500 | Freq: Once | INTRAVENOUS | Status: AC | PRN
  Administered 2022-06-09: 115.75 via INTRAVENOUS

## 2022-06-09 NOTE — Progress Notes (Signed)
  Radiation Oncology         [phone]) [phone] ________________________________  Name: WILLET SCHLEIFER MRN: [id number]  Date: 06/09/2022  DOB: July 26, 1931  Radium-223 Infusion Note  Diagnosis:  Castration resistant prostate cancer with painful bone involvement  Current Infusion:    4  Planned Infusions:  6  Narrative: Mr. DEREC MOZINGO presented to nuclear medicine for treatment. His most recent blood counts were reviewed.  He remains a good candidate to proceed with Ra-223.  The patient was situated in an infusion suite with a contact barrier placed under his arm. Intravenous access was established, using sterile technique, and a normal saline infusion from a syringe was started.  Micro-dosimetry:  The prescribed radiation activity was assayed and confirmed to be within specified tolerance.  Special Treatment Procedure - Infusion:  The nuclear medicine technologist and I personally verified the dose activity to be delivered as specified in the written directive, and verified the patient identification via 2 separate methods.  The syringe containing the dose was attached to an intravenous access and the dose delivered over a minute. No complications were noted.  The total administered dose was 119.2 microcuries.   A saline flush of the line and the syringe that contained the isotope was then performed.  The residual radioactivity in the syringe was 3.5 microcuries, so the actual infused isotope activity was 115.8 microcuries.   Pressure was applied to the venipuncture site, and a compression bandage placed.   Radiation Safety personnel were present to perform the discharge survey, as detailed on their documentation.   After a short period of observation, the patient had his IV removed.  Impression:  The patient tolerated his infusion relatively well.  Plan:  The patient will return in one month for ongoing care.    ________________________________  Sheral Apley. Tammi Klippel, M.D.

## 2022-06-19 ENCOUNTER — Telehealth: Payer: Self-pay | Admitting: *Deleted

## 2022-06-19 ENCOUNTER — Other Ambulatory Visit: Payer: Self-pay

## 2022-06-19 DIAGNOSIS — C61 Malignant neoplasm of prostate: Secondary | ICD-10-CM

## 2022-06-19 NOTE — Progress Notes (Signed)
Per Dr. Tammi Klippel.

## 2022-06-19 NOTE — Telephone Encounter (Signed)
CALLED PATIENT TO INFORM OF LAB AND WEIGHT APPT. FOR 07-04-22 @ 12 PM @ [place]. ON 07-11-22 @ 11:30 AM @ [facility], SPOKE WITH PATIENT'S WIFE- [name] AND SHE IS AWARE OF THESE APPTS.

## 2022-06-21 DIAGNOSIS — C7951 Secondary malignant neoplasm of bone: Secondary | ICD-10-CM | POA: Diagnosis not present

## 2022-06-21 DIAGNOSIS — C61 Malignant neoplasm of prostate: Secondary | ICD-10-CM | POA: Diagnosis not present

## 2022-07-03 ENCOUNTER — Telehealth: Payer: Self-pay | Admitting: *Deleted

## 2022-07-03 NOTE — Telephone Encounter (Signed)
CALLED PATIENT TO REMIND OF LAB AND WEIGHT APPT. FOR 07-04-22 2 @ 12 PM, SPOKE WITH PATIENT'S WIFE - [name] AND SHE IS AWARE OF THIS APPT.

## 2022-07-04 ENCOUNTER — Ambulatory Visit
Admission: RE | Admit: 2022-07-04 | Discharge: 2022-07-04 | Disposition: A | Discharge status: Home / Self Care | Payer: Medicare Other | Source: Ambulatory Visit | Attending: Radiation Oncology | Admitting: Radiation Oncology

## 2022-07-04 ENCOUNTER — Other Ambulatory Visit: Payer: Self-pay

## 2022-07-04 DIAGNOSIS — C61 Malignant neoplasm of prostate: Secondary | ICD-10-CM | POA: Insufficient documentation

## 2022-07-04 DIAGNOSIS — Z23 Encounter for immunization: Secondary | ICD-10-CM | POA: Diagnosis not present

## 2022-07-04 LAB — CBC WITH DIFFERENTIAL (CANCER CENTER ONLY)
Abs Immature Granulocytes: 0.03 10*3/uL (ref 0.00–0.07)
Basophils Absolute: 0 10*3/uL (ref 0.0–0.1)
Basophils Relative: 1 %
Eosinophils Absolute: 0.1 10*3/uL (ref 0.0–0.5)
Eosinophils Relative: 1 %
HCT: 41.6 % (ref 39.0–52.0)
Hemoglobin: 13.3 g/dL (ref 13.0–17.0)
Immature Granulocytes: 1 %
Lymphocytes Relative: 19 %
Lymphs Abs: 1.2 10*3/uL (ref 0.7–4.0)
MCH: 30.5 pg (ref 26.0–34.0)
MCHC: 32 g/dL (ref 30.0–36.0)
MCV: 95.4 fL (ref 80.0–100.0)
Monocytes Absolute: 0.3 10*3/uL (ref 0.1–1.0)
Monocytes Relative: 5 %
Neutro Abs: 4.8 10*3/uL (ref 1.7–7.7)
Neutrophils Relative %: 73 %
Platelet Count: 227 10*3/uL (ref 150–400)
RBC: 4.36 MIL/uL (ref 4.22–5.81)
RDW: 14.3 % (ref 11.5–15.5)
WBC Count: 6.5 10*3/uL (ref 4.0–10.5)
nRBC: 0 % (ref 0.0–0.2)

## 2022-07-10 ENCOUNTER — Telehealth: Payer: Self-pay | Admitting: *Deleted

## 2022-07-10 NOTE — Telephone Encounter (Signed)
CALLED PATIENT TO REMIND OF XOFIGO INJ. FOR 07-11-22- ARRIVAL TIME- 11:15 AM @ [facility], SPOKE WITH PATIENT'S WIFE- [name] AND SHE IS AWARE OF THIS APPT.

## 2022-07-11 ENCOUNTER — Ambulatory Visit (HOSPITAL_COMMUNITY)
Admission: RE | Admit: 2022-07-11 | Discharge: 2022-07-11 | Disposition: A | Discharge status: Home / Self Care | Payer: Medicare Other | Source: Ambulatory Visit | Attending: Radiation Oncology | Admitting: Radiation Oncology

## 2022-07-11 DIAGNOSIS — C61 Malignant neoplasm of prostate: Secondary | ICD-10-CM | POA: Insufficient documentation

## 2022-07-11 DIAGNOSIS — C7951 Secondary malignant neoplasm of bone: Secondary | ICD-10-CM | POA: Insufficient documentation

## 2022-07-11 DIAGNOSIS — Z192 Hormone resistant malignancy status: Secondary | ICD-10-CM | POA: Diagnosis not present

## 2022-07-11 MED ORDER — RADIUM RA 223 DICHLORIDE 30 MCCI/ML IV SOLN
113.6200 | Freq: Once | INTRAVENOUS | Status: AC | PRN
  Administered 2022-07-11: 113.62 via INTRAVENOUS

## 2022-07-11 NOTE — Progress Notes (Signed)
  Radiation Oncology         [phone]) [phone] ________________________________  Name: Steven Cain MRN: [id number]  Date: 07/11/2022  DOB: [birth year]  Radium-223 Infusion Note  Diagnosis:  Castration resistant prostate cancer with painful bone involvement  Current Infusion:    5  Planned Infusions:  6  Narrative: [name] presented to nuclear medicine for treatment. His most recent blood counts were reviewed.  He remains a good candidate to proceed with Ra-223.  The patient was situated in an infusion suite with a contact barrier placed under his arm. Intravenous access was established, using sterile technique, and a normal saline infusion from a syringe was started.  Micro-dosimetry:  The prescribed radiation activity was assayed and confirmed to be within specified tolerance.  Special Treatment Procedure - Infusion:  The nuclear medicine technologist and I personally verified the dose activity to be delivered as specified in the written directive, and verified the patient identification via 2 separate methods.  The syringe containing the dose was attached to an intravenous access and the dose delivered over a minute. No complications were noted.  The total administered dose was 118.3 microcuries.   A saline flush of the line and the syringe that contained the isotope was then performed.  The residual radioactivity in the syringe was 4.68 microcuries, so the actual infused isotope activity was 113.62 microcuries.   Pressure was applied to the venipuncture site, and a compression bandage placed.   Radiation Safety personnel were present to perform the discharge survey, as detailed on their documentation.   After a short period of observation, the patient had his IV removed.  Impression:  The patient tolerated his infusion relatively well.  Plan:  The patient will return in one month for ongoing care.    ________________________________  Sheral Apley. Tammi Klippel, M.D.

## 2022-07-19 DIAGNOSIS — C7951 Secondary malignant neoplasm of bone: Secondary | ICD-10-CM | POA: Diagnosis not present

## 2022-07-19 DIAGNOSIS — C61 Malignant neoplasm of prostate: Secondary | ICD-10-CM | POA: Diagnosis not present

## 2022-07-25 ENCOUNTER — Telehealth: Payer: Self-pay | Admitting: *Deleted

## 2022-07-25 NOTE — Telephone Encounter (Signed)
CALLED PATIENT TO INFORM OF LAB AND WEIGHT APPT. FOR 08-03-22  AND HIS XOFIGO INJ. ON 08-10-22, LVM FOR A RETURN CALL

## 2022-07-26 ENCOUNTER — Other Ambulatory Visit: Payer: Self-pay

## 2022-07-26 DIAGNOSIS — C61 Malignant neoplasm of prostate: Secondary | ICD-10-CM

## 2022-07-26 NOTE — Progress Notes (Signed)
Per Dr. Tammi Klippel.

## 2022-08-01 ENCOUNTER — Telehealth: Payer: Self-pay | Admitting: *Deleted

## 2022-08-01 NOTE — Telephone Encounter (Signed)
Called patient's wife- Lou Miner to remind of lab and weight for 08-03-22 @ 12, spoke with patient's wife Lou Miner and she is aware of these appts/

## 2022-08-03 ENCOUNTER — Other Ambulatory Visit: Payer: Self-pay

## 2022-08-03 ENCOUNTER — Ambulatory Visit
Admission: RE | Admit: 2022-08-03 | Discharge: 2022-08-03 | Disposition: A | Discharge status: Home / Self Care | Payer: Medicare Other | Source: Ambulatory Visit | Attending: Radiation Oncology | Admitting: Radiation Oncology

## 2022-08-03 DIAGNOSIS — C61 Malignant neoplasm of prostate: Secondary | ICD-10-CM | POA: Diagnosis not present

## 2022-08-03 LAB — CBC WITH DIFFERENTIAL (CANCER CENTER ONLY)
Abs Immature Granulocytes: 0.03 10*3/uL (ref 0.00–0.07)
Basophils Absolute: 0.1 10*3/uL (ref 0.0–0.1)
Basophils Relative: 1 %
Eosinophils Absolute: 1.1 10*3/uL — ABNORMAL HIGH (ref 0.0–0.5)
Eosinophils Relative: 13 %
HCT: 40.4 % (ref 39.0–52.0)
Hemoglobin: 13.1 g/dL (ref 13.0–17.0)
Immature Granulocytes: 0 %
Lymphocytes Relative: 24 %
Lymphs Abs: 2 10*3/uL (ref 0.7–4.0)
MCH: 30.5 pg (ref 26.0–34.0)
MCHC: 32.4 g/dL (ref 30.0–36.0)
MCV: 94.2 fL (ref 80.0–100.0)
Monocytes Absolute: 0.7 10*3/uL (ref 0.1–1.0)
Monocytes Relative: 9 %
Neutro Abs: 4.5 10*3/uL (ref 1.7–7.7)
Neutrophils Relative %: 53 %
Platelet Count: 240 10*3/uL (ref 150–400)
RBC: 4.29 MIL/uL (ref 4.22–5.81)
RDW: 14.8 % (ref 11.5–15.5)
WBC Count: 8.4 10*3/uL (ref 4.0–10.5)
nRBC: 0 % (ref 0.0–0.2)

## 2022-08-09 ENCOUNTER — Telehealth: Payer: Self-pay | Admitting: *Deleted

## 2022-08-09 NOTE — Telephone Encounter (Signed)
CALLED PATIENT TO REMIND OF XOFIGO INJ. FOR 08-10-22- ARRIVAL TIME- 1:15 PM @ [facility], SPOKE WITH PATIENT'S [name] AND SHE IS AWARE OF THIS INJ.

## 2022-08-10 ENCOUNTER — Ambulatory Visit (HOSPITAL_COMMUNITY)
Admission: RE | Admit: 2022-08-10 | Discharge: 2022-08-10 | Disposition: A | Discharge status: Home / Self Care | Payer: Medicare Other | Source: Ambulatory Visit | Attending: Radiation Oncology | Admitting: Radiation Oncology

## 2022-08-10 DIAGNOSIS — C7951 Secondary malignant neoplasm of bone: Secondary | ICD-10-CM | POA: Insufficient documentation

## 2022-08-10 DIAGNOSIS — Z192 Hormone resistant malignancy status: Secondary | ICD-10-CM | POA: Diagnosis not present

## 2022-08-10 DIAGNOSIS — C61 Malignant neoplasm of prostate: Secondary | ICD-10-CM | POA: Insufficient documentation

## 2022-08-10 MED ORDER — RADIUM RA 223 DICHLORIDE 30 MCCI/ML IV SOLN
116.8200 | Freq: Once | INTRAVENOUS | Status: AC | PRN
  Administered 2022-08-10: 116.82 via INTRAVENOUS

## 2022-08-10 NOTE — Progress Notes (Signed)
  Radiation Oncology         [phone]) [phone] ________________________________  Name: EBENEZER MCCASKEY MRN: [id number]  Date: 08/10/2022  DOB: [birth year]  Radium-223 Infusion Note  Diagnosis:  Castration resistant prostate cancer with painful bone involvement  Current Infusion:    6  Planned Infusions:  6  Narrative: Mr. RYLEN SWINDLER presented to nuclear medicine for treatment. His most recent blood counts were reviewed.  He remains a good candidate to proceed with Ra-223.  The patient was situated in an infusion suite with a contact barrier placed under his arm. Intravenous access was established, using sterile technique, and a normal saline infusion from a syringe was started.  Micro-dosimetry:  The prescribed radiation activity was assayed and confirmed to be within specified tolerance.  Special Treatment Procedure - Infusion:  The nuclear medicine technologist and I personally verified the dose activity to be delivered as specified in the written directive, and verified the patient identification via 2 separate methods.  The syringe containing the dose was attached to an intravenous access and the dose delivered over a minute. No complications were noted.  The total administered dose was 117.2 microcuries.   A saline flush of the line and the syringe that contained the isotope was then performed.  The residual radioactivity in the syringe was 0.38 microcuries, so the actual infused isotope activity was 116.82 microcuries.   Pressure was applied to the venipuncture site, and a compression bandage placed.   Radiation Safety personnel were present to perform the discharge survey, as detailed on their documentation.   After a short period of observation, the patient had his IV removed.  Impression:  The patient tolerated his infusion relatively well.  Plan:  The patient will return in one month for ongoing care.    ________________________________  Sheral Apley. Tammi Klippel, M.D.

## 2022-08-16 DIAGNOSIS — C7951 Secondary malignant neoplasm of bone: Secondary | ICD-10-CM | POA: Diagnosis not present

## 2022-08-16 DIAGNOSIS — C61 Malignant neoplasm of prostate: Secondary | ICD-10-CM | POA: Diagnosis not present

## 2022-08-16 DIAGNOSIS — N3000 Acute cystitis without hematuria: Secondary | ICD-10-CM | POA: Diagnosis not present

## 2022-08-23 IMAGING — CT CT HEAD W/O CM
4 series · 16 of 47 positions shown, 18 images · non-contrast
Comparison: None.

CLINICAL DATA: Delirium, seizure

EXAM:
CT HEAD WITHOUT CONTRAST
TECHNIQUE: Contiguous axial images were obtained from the base of the skull
through the vertex without intravenous contrast.

[Series 3: head without · axial · non-contrast · 0.42mm/px · z∈[-87,+38]mm · 7 of 35 slices shown, 9 images]
[im 5/35  brain]
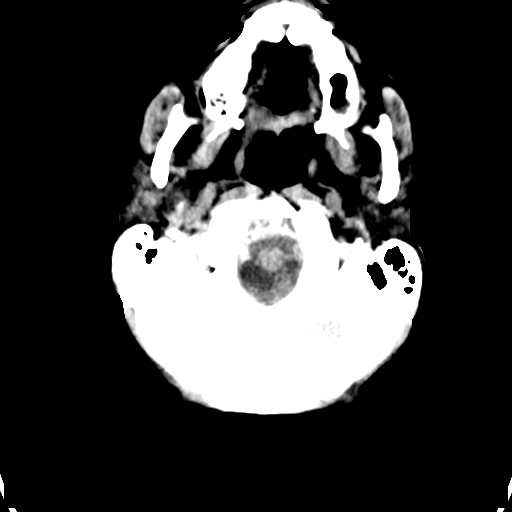
[im 5/35  bone]
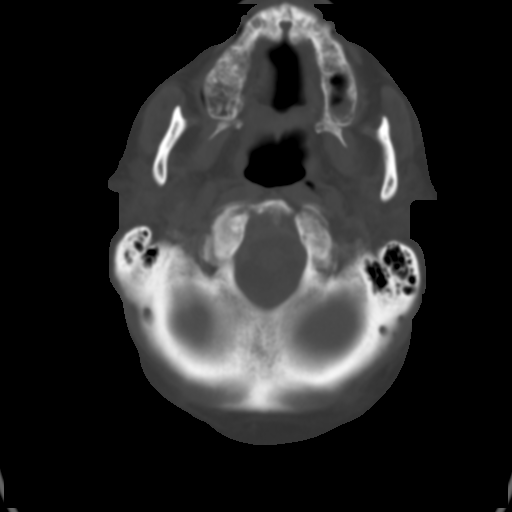
[im 9/35  brain]
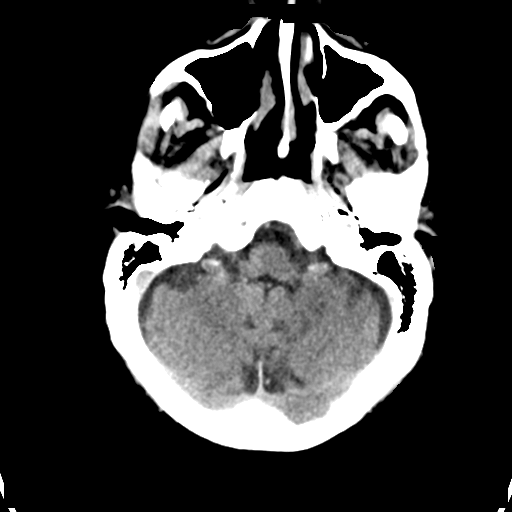
[im 13/35  brain]
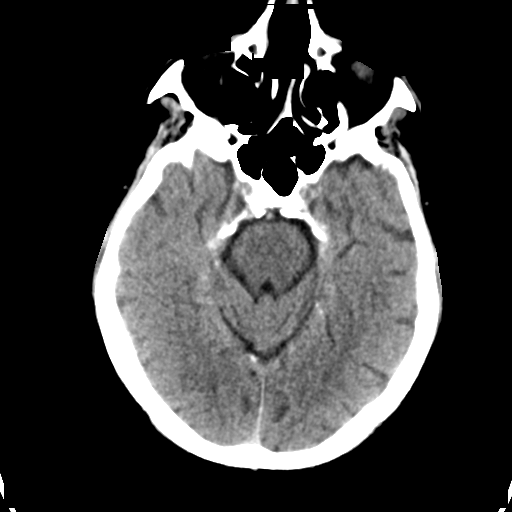
[im 18/35  brain]
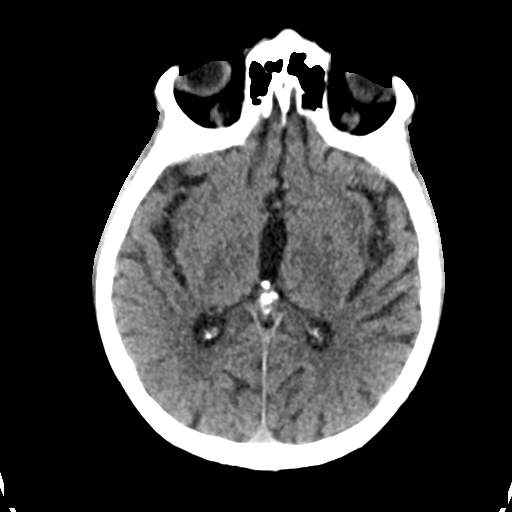
[im 22/35  brain]
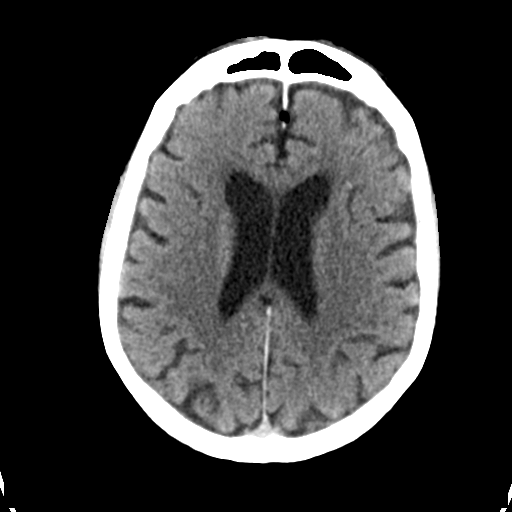
[im 22/35  bone]
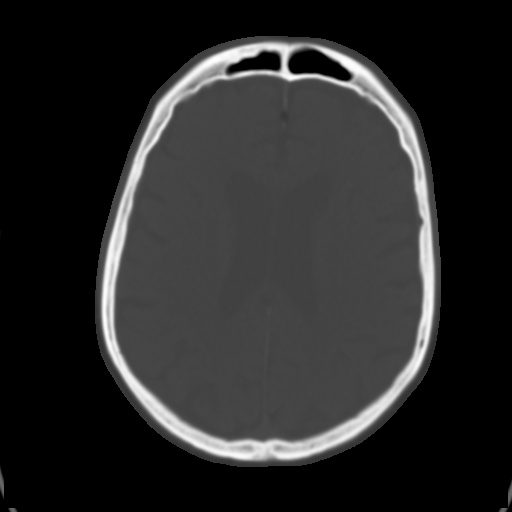
[im 26/35  brain]
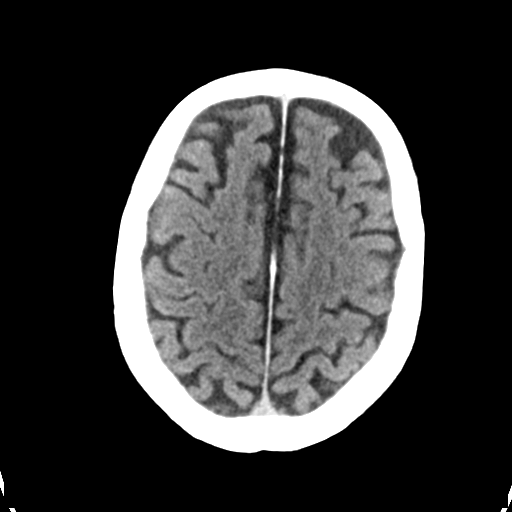
[im 30/35  brain]
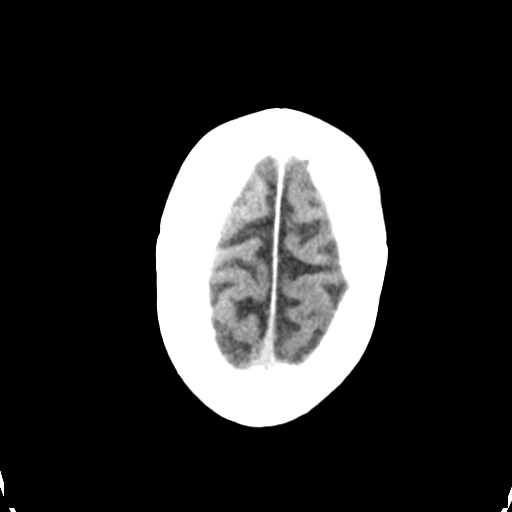

[Series 4: head bone · axial · 0.42mm/px · z∈[-89,-55]mm · 3 of 86 slices shown]
[im 9/86  bone]
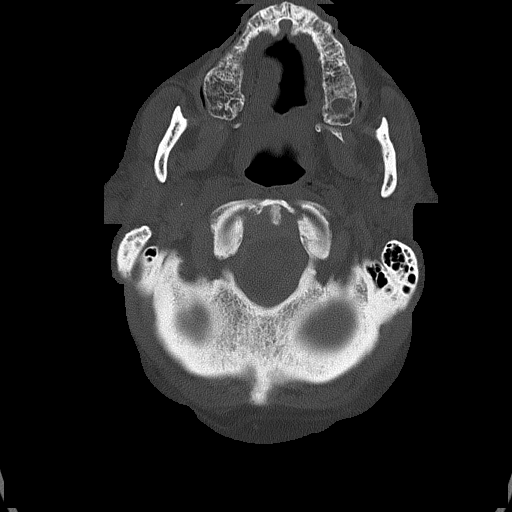
[im 18/86  bone]
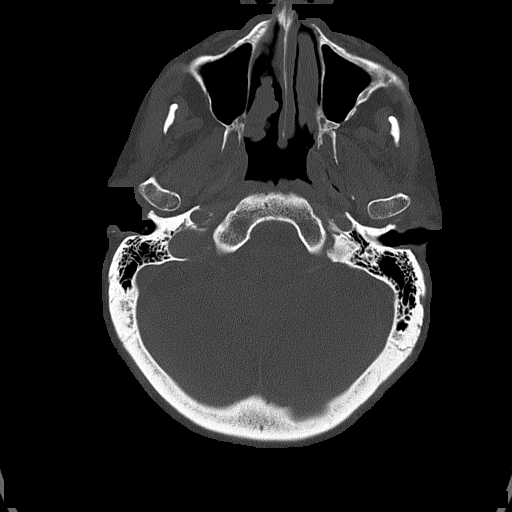
[im 26/86  bone]
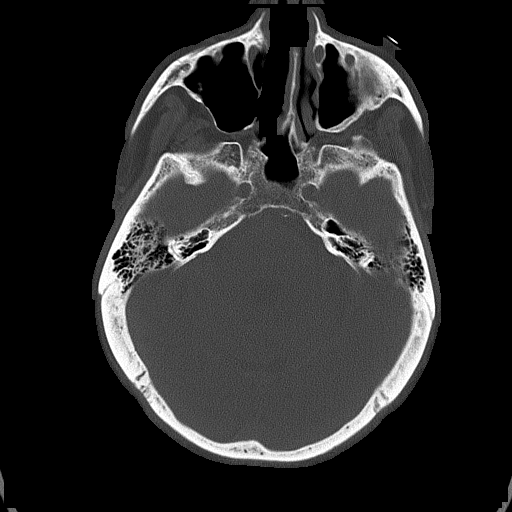

[Series 5: head without cor · coronal · non-contrast · 0.33mm/px · 3 of 69 slices shown]
[im 23/69  brain]
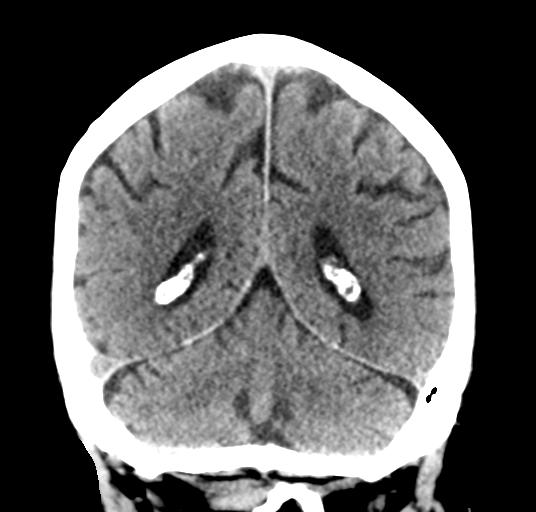
[im 31/69  brain]
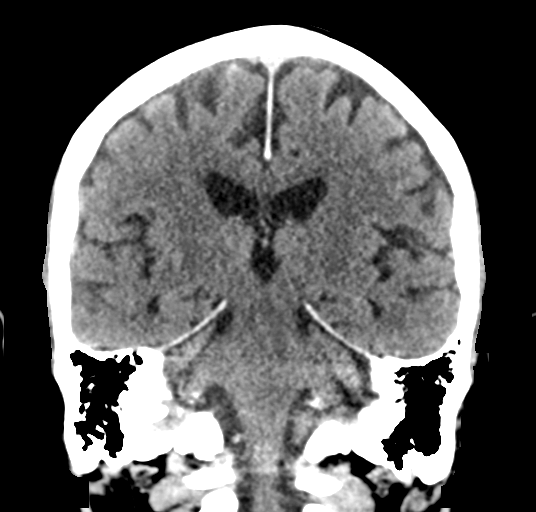
[im 38/69  brain]
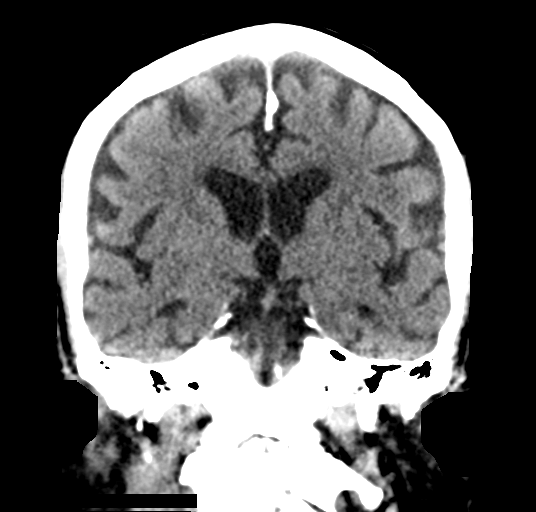

[Series 6: head without sag · sagittal · non-contrast · 0.32mm/px · 3 of 54 slices shown]
[im 18/54  brain]
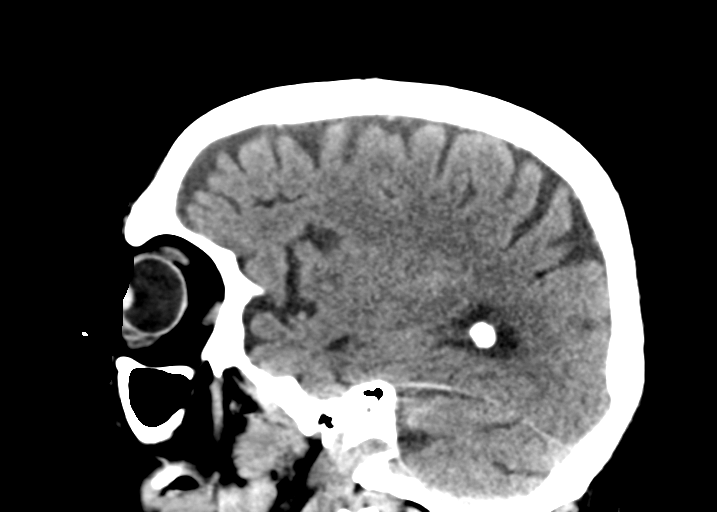
[im 27/54  brain]
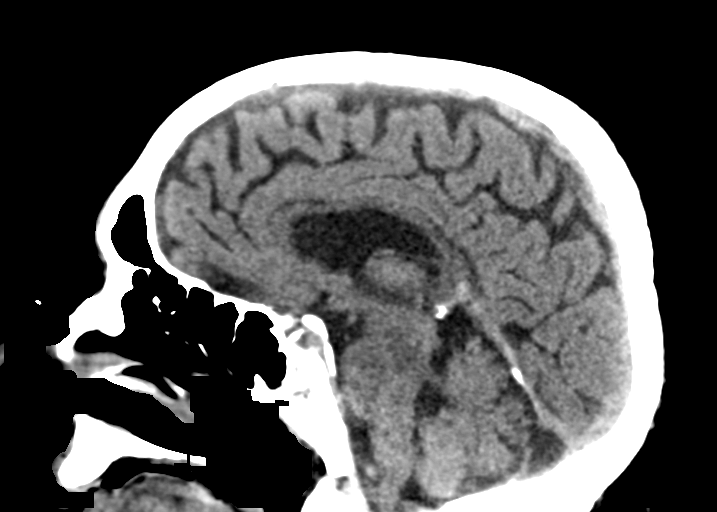
[im 36/54  brain]
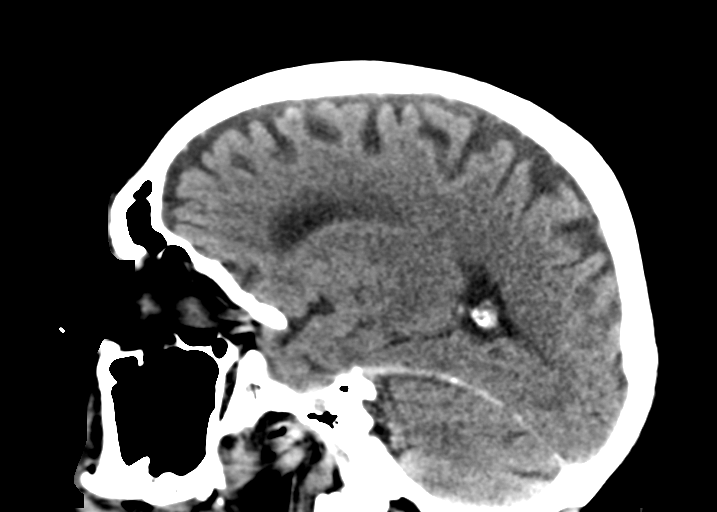

[16 of 47 positions shown; findings below may reference images not displayed]

FINDINGS: Brain: No acute infarct or intracranial hemorrhage. No mass lesion.
No midline shift, ventriculomegaly or extra-axial fluid collection.
Chronic right cerebellar lacunar insult.

Vascular: No hyperdense vessel or unexpected calcification.
Bilateral skull base atherosclerotic calcifications.

Skull: Negative for fracture or focal lesion.

Sinuses/Orbits: No acute finding. Mild left maxillary sinus
hyperostosis. Clear paranasal sinuses.

Other: None.
IMPRESSION: No acute intracranial process.

Chronic right cerebellar lacunar insult.

## 2022-08-24 DIAGNOSIS — C7951 Secondary malignant neoplasm of bone: Secondary | ICD-10-CM | POA: Diagnosis not present

## 2022-08-24 DIAGNOSIS — N3941 Urge incontinence: Secondary | ICD-10-CM | POA: Diagnosis not present

## 2022-08-24 DIAGNOSIS — C61 Malignant neoplasm of prostate: Secondary | ICD-10-CM | POA: Diagnosis not present

## 2022-08-24 DIAGNOSIS — N135 Crossing vessel and stricture of ureter without hydronephrosis: Secondary | ICD-10-CM | POA: Diagnosis not present

## 2022-08-28 ENCOUNTER — Other Ambulatory Visit: Payer: Self-pay | Admitting: Urology

## 2022-08-30 ENCOUNTER — Encounter (HOSPITAL_COMMUNITY): Payer: Self-pay | Admitting: Urology

## 2022-08-31 NOTE — Patient Instructions (Addendum)
SURGICAL WAITING ROOM VISITATION  Patients having surgery or a procedure may have no more than 2 support people in the waiting area - these visitors may rotate.    Children under the age of 49 must have an adult with them who is not the patient.  Due to an increase in RSV and influenza rates and associated hospitalizations, children ages 61 and under may not visit patients in [place].  If the patient needs to stay at the hospital during part of their recovery, the visitor guidelines for inpatient rooms apply. Pre-op nurse will coordinate an appropriate time for 1 support person to accompany patient in pre-op.  This support person may not rotate.    Please refer to the [facility] website for the visitor guidelines for Inpatients (after your surgery is over and you are in a regular room).       Your procedure is scheduled on: 09/06/22    Report to [facility] Main Entrance    Report to admitting at  1145AM   Call this number if you have problems the morning of surgery [phone]   Do not eat food  or drink liquids :After Midnight.                                 If you have questions, please contact your surgeon's office.       Oral Hygiene is also important to reduce your risk of infection.                                    Remember - BRUSH YOUR TEETH THE MORNING OF SURGERY WITH YOUR REGULAR TOOTHPASTE  DENTURES WILL BE REMOVED PRIOR TO SURGERY PLEASE DO NOT APPLY "Poly grip" OR ADHESIVES!!!   Do NOT smoke after Midnight   Take these medicines the morning of surgery with A SIP OF WATER:   inhalers if needed and bring, meclizine, ditropan    DO NOT TAKE ANY ORAL DIABETIC MEDICATIONS DAY OF YOUR SURGERY  Bring CPAP mask and tubing day of surgery.                              You may not have any metal on your body including hair pins, jewelry, and body piercing             Do not wear make-up, lotions, powders, perfumes/cologne, or  deodorant  Do not wear nail polish including gel and S&S, artificial/acrylic nails, or any other type of covering on natural nails including finger and toenails. If you have artificial nails, gel coating, etc. that needs to be removed by a nail salon please have this removed prior to surgery or surgery may need to be canceled/ delayed if the surgeon/ anesthesia feels like they are unable to be safely monitored.   Do not shave  48 hours prior to surgery.               Men may shave face and neck.   Do not bring valuables to the hospital. [place].   Contacts, glasses, dentures or bridgework may not be worn into surgery.   Bring small overnight bag day of surgery.  DO NOT [place]. PHARMACY WILL DISPENSE MEDICATIONS LISTED ON YOUR MEDICATION LIST TO YOU DURING YOUR ADMISSION [place]!    Patients discharged on the day of surgery will not be allowed to drive home.  Someone NEEDS to stay with you for the first 24 hours after anesthesia.   Special Instructions: Bring a copy of your healthcare power of attorney and living will documents the day of surgery if you haven't scanned them before.              Please read over the following fact sheets you were given: IF [place] [phone]   If you received a COVID test during your pre-op visit  it is requested that you wear a mask when out in public, stay away from anyone that may not be feeling well and notify your surgeon if you develop symptoms. If you test positive for Covid or have been in contact with anyone that has tested positive in the last 10 days please notify you surgeon.    [facility] - Preparing for Surgery Before surgery, you can play an important role.  Because skin is not sterile, your skin needs to be as free of germs as possible.  You can reduce the number of germs on your skin by  washing with CHG (chlorahexidine gluconate) soap before surgery.  CHG is an antiseptic cleaner which kills germs and bonds with the skin to continue killing germs even after washing. Please DO NOT use if you have an allergy to CHG or antibacterial soaps.  If your skin becomes reddened/irritated stop using the CHG and inform your nurse when you arrive at Short Stay. Do not shave (including legs and underarms) for at least 48 hours prior to the first CHG shower.  You may shave your face/neck. Please follow these instructions carefully:  1.  Shower with CHG Soap the night before surgery and the  morning of Surgery.  2.  If you choose to wash your hair, wash your hair first as usual with your  normal  shampoo.  3.  After you shampoo, rinse your hair and body thoroughly to remove the  shampoo.                           4.  Use CHG as you would any other liquid soap.  You can apply chg directly  to the skin and wash                       Gently with a scrungie or clean washcloth.  5.  Apply the CHG Soap to your body ONLY FROM THE NECK DOWN.   Do not use on face/ open                           Wound or open sores. Avoid contact with eyes, ears mouth and genitals (private parts).                       Wash face,  Genitals (private parts) with your normal soap.             6.  Wash thoroughly, paying special attention to the area where your surgery  will be performed.  7.  Thoroughly rinse your body with warm water from the neck down.  8.  DO NOT shower/wash with your normal soap after using and rinsing off  the CHG Soap.                9.  Pat yourself dry with a clean towel.            10.  Wear clean pajamas.            11.  Place clean sheets on your bed the night of your first shower and do not  sleep with pets. Day of Surgery : Do not apply any lotions/deodorants the morning of surgery.  Please wear clean clothes to the hospital/surgery center.  FAILURE TO FOLLOW THESE INSTRUCTIONS MAY RESULT IN THE  CANCELLATION OF YOUR SURGERY PATIENT SIGNATURE_________________________________  NURSE SIGNATURE__________________________________  ________________________________________________________________________

## 2022-08-31 NOTE — Progress Notes (Addendum)
Anesthesia Review:  PCP: DR Shon Baton  Cardiologist : DR Virl Axe LOV 03/24/21  Chest x-ray : EKG : 09/04/22  Echo : 2022  Stress test: Cardiac Cath :  Activity level: cannot do a light of stairs, pt in a wheelchair  Sleep Study/ CPAP : none  Fasting Blood Sugar :      / Checks Blood Sugar -- times a day:   Blood Thinner/ Instructions /Last Dose: ASA / Instructions/ Last Dose :   Retired Engineer, drilling .   EKG at preop shows rate 95- accelerated juncitonal rhythm.  Lyndon Code, [facility] aware .  Blood pressure 134/78.  No new orders given.  Wife reports at preop that pt 's heart rate runs in 30s sometimes.  Wife is a Marine scientist at [employer] and still works.  Shawn Stall called back and no new orders given.  Shawn Stall was on speakerphone and wife heard conversation " that as long as pt asymptomatic" .  Blood pressure was 134/78 at preop and [facility] notified.   BMP done 09/04/22- routed to DR [facility].

## 2022-09-04 ENCOUNTER — Encounter (HOSPITAL_COMMUNITY): Payer: Self-pay

## 2022-09-04 ENCOUNTER — Encounter (HOSPITAL_COMMUNITY)
Admission: RE | Admit: 2022-09-04 | Discharge: 2022-09-04 | Disposition: A | Discharge status: Home / Self Care | Payer: Medicare Other | Source: Ambulatory Visit | Attending: Urology | Admitting: Urology

## 2022-09-04 ENCOUNTER — Other Ambulatory Visit: Payer: Self-pay

## 2022-09-04 VITALS — BP 134/78 | HR 94 | Temp 98.4°F | Resp 16 | Ht 63.0 in | Wt 180.0 lb

## 2022-09-04 DIAGNOSIS — N135 Crossing vessel and stricture of ureter without hydronephrosis: Secondary | ICD-10-CM | POA: Insufficient documentation

## 2022-09-04 DIAGNOSIS — C61 Malignant neoplasm of prostate: Secondary | ICD-10-CM | POA: Insufficient documentation

## 2022-09-04 DIAGNOSIS — N183 Chronic kidney disease, stage 3 unspecified: Secondary | ICD-10-CM | POA: Insufficient documentation

## 2022-09-04 DIAGNOSIS — Z87891 Personal history of nicotine dependence: Secondary | ICD-10-CM | POA: Insufficient documentation

## 2022-09-04 DIAGNOSIS — I129 Hypertensive chronic kidney disease with stage 1 through stage 4 chronic kidney disease, or unspecified chronic kidney disease: Secondary | ICD-10-CM | POA: Insufficient documentation

## 2022-09-04 DIAGNOSIS — Z01818 Encounter for other preprocedural examination: Secondary | ICD-10-CM | POA: Diagnosis not present

## 2022-09-04 HISTORY — DX: Cerebral infarction, unspecified: I63.9

## 2022-09-04 HISTORY — DX: Pneumonia, unspecified organism: J18.9

## 2022-09-04 LAB — CBC
HCT: 42.5 % (ref 39.0–52.0)
Hemoglobin: 13.6 g/dL (ref 13.0–17.0)
MCH: 30.2 pg (ref 26.0–34.0)
MCHC: 32 g/dL (ref 30.0–36.0)
MCV: 94.4 fL (ref 80.0–100.0)
Platelets: 228 10*3/uL (ref 150–400)
RBC: 4.5 MIL/uL (ref 4.22–5.81)
RDW: 15.4 % (ref 11.5–15.5)
WBC: 9.2 10*3/uL (ref 4.0–10.5)
nRBC: 0 % (ref 0.0–0.2)

## 2022-09-04 LAB — BASIC METABOLIC PANEL
Anion gap: 9 (ref 5–15)
BUN: 20 mg/dL (ref 8–23)
CO2: 27 mmol/L (ref 22–32)
Calcium: 9 mg/dL (ref 8.9–10.3)
Chloride: 101 mmol/L (ref 98–111)
Creatinine, Ser: 1.59 mg/dL — ABNORMAL HIGH (ref 0.61–1.24)
GFR, Estimated: 41 mL/min — ABNORMAL LOW (ref >60)
Glucose, Bld: 135 mg/dL — ABNORMAL HIGH (ref 70–99)
Potassium: 3.9 mmol/L (ref 3.5–5.1)
Sodium: 137 mmol/L (ref 135–145)

## 2022-09-05 NOTE — Progress Notes (Signed)
Anesthesia Chart Review   Case: [id number] Date/Time: 09/06/22 1330   Procedures:      CYSTOSCOPY WITH RETROGRADE PYELOGRAM/URETERAL STENT EXCHANGE (Left) - 1 HR     REPEAT TRANSURETHRAL RESECTION OF THE PROSTATE (TURP)   Anesthesia type: General   Pre-op diagnosis: LEFT URETERAL STRICTURE, PROSTATE CANCER   Location: [facility]   Surgeons: [name], MD       DISCUSSION:89 y.o. former smoker with h/o prostate cancer with bony mets currently on testosterone antagonist treatment, grade 1 diastolic dysfunction, hypertension, chronic kidney disease stage III, remote history of seizures in the past in setting of alcohol use or withdrawal with last seizure being 37 years ago and not been on long-term antiepileptics left ureteral stricture scheduled for above procedure 09/06/2022 with Dr. [name].   He has had Roux and Y gastric bypass surgery and does have history of gastric dumping syndrome particularly when he eats certain sweet foods.   He had echocardiogram on 08/05/2021 which showed ejection fraction of 55 to 60% with no regional wall motion abnormalities.   Pt has significant problems with balance, uses wheelchair for long distances.   History of severe orthostatic hypertension.  Seen by cardio for recurrent syncopal episodes, no a candidate for PPM. Per cardiology, "The patient continues with orthostatic syncope, the most recent episode aggravated by efforts to treat systolic blood pressures already high at about 200. However, he has had systolic drops in the 24M with standing with a delta of about 80 mm. Hence, I think systolic hypertension has to be accepted. The issue will be how much can we tolerate and I suspect will have to be close to 200 mm."  VS: BP 134/78   Pulse 94   Temp 36.9 C (Oral)   Resp 16   Ht '5\' 3"'$  (1.6 m)   Wt 81.6 kg   SpO2 96%   BMI 31.89 kg/m   PROVIDERS: Shon Baton, MD is PCP    LABS: Labs reviewed: Acceptable for surgery. (all  labs ordered are listed, but only abnormal results are displayed)  Labs Reviewed  BASIC METABOLIC PANEL - Abnormal; Notable for the following components:      Result Value   Glucose, Bld 135 (*)    Creatinine, Ser 1.59 (*)    GFR, Estimated 41 (*)    All other components within normal limits  CBC     IMAGES:   EKG:   CV: Echo 10/06/2020 1. Left ventricular ejection fraction, by estimation, is 55 to 60%. The  left ventricle has normal function. The left ventricle has no regional  wall motion abnormalities. Diastolic function is not assessed due to AV  block.   2. Right ventricular systolic function is low normal. The right  ventricular size is normal. There is moderately elevated pulmonary artery  systolic pressure. The estimated right ventricular systolic pressure is  01.0 mmHg.   3. The mitral valve is grossly normal. Trivial mitral valve  regurgitation. No evidence of mitral stenosis.   4. The aortic valve is tricuspid. Aortic valve regurgitation is trivial.  Mild aortic valve sclerosis is present, with no evidence of aortic valve  stenosis.   5. Aortic Normal proximal ascending aorta. There is borderline dilatation  of the aortic root, measuring 39 mm.   6. The inferior vena cava is normal in size with greater than 50%  respiratory variability, suggesting right atrial pressure of 3 mmHg.  Past Medical History:  Diagnosis Date   Abnormal  EKG    hx of left anterior fasicular block on ekg, no cardiologist   Anxiety    AR (aortic regurgitation) 03/23/2017   Mild, Noted on ECHO   Arthritis    DJD   Benign enlargement of prostate    frequent UTI, trim of prostate done , with some issues of incontinence now.   Cancer ([place])    Chronic bronchitis ([place])    chronic- usually has phelgm often   COVID-19 08/2019   fatigue and loss of taste and smell x 10 days did monoclonal antibody therapy   Depression    Fluid retention in legs    resolved   GERD (gastroesophageal  reflux disease)    rare   GI bleed    Grade I diastolic dysfunction    Hard of hearing    hear more on left side- no hearing aid   History of colon polyps    History of transfusion 2016   Hydronephrosis 2018   Left ckd stage 3 no nephrologist per wife adele   Hypertension    Moderate concentric left ventricular hypertrophy 03/23/2017   Noted on ECHO   Nausea & vomiting 06/08/2018   Perforated, severe stomach ulcer ([place]) 1974   Peripheral neuropathy    feet and toes   Pneumonia    Prostate cancer metastatic to bone [facility])    Spinal stenosis    Stroke ([place])    cerebellalr stroke   TR (tricuspid regurgitation) 03/23/2017   Mild, Noted on ECHO   Transfusion history    none recent   Ureteral stricture    CHRONIC   Urinary incontinence    UTI (urinary tract infection)    frequent   Wears glasses     Past Surgical History:  Procedure Laterality Date   ABDOMINAL SURGERY     bilroths tube '74   COLONOSCOPY N/A 09/29/2014   Procedure: COLONOSCOPY;  Surgeon: Beryle Beams, MD;  [facility];  Service: Endoscopy;  Laterality: N/A;   CYSTOSCOPY W/ URETERAL STENT PLACEMENT Left 05/06/2014   Procedure: CYSTOSCOPY WITH RETROGRADE PYELOGRAM/URETERAL STENT PLACEMENT;  Surgeon: [name], MD;  [facility];  Service: Urology;  Laterality: Left;   CYSTOSCOPY W/ URETERAL STENT PLACEMENT Left 03/31/2015   Procedure: CYSTOSCOPY WITH LEFT RETROGRADE PYELOGRAM/URETERAL STENT EXCHANGE;  Surgeon: [name], MD;  [facility];  Service: Urology;  Laterality: Left;   CYSTOSCOPY W/ URETERAL STENT PLACEMENT Left 09/15/2015   Procedure: CYSTOSCOPY WITH LEFT RETROGRADE PYELOGRAM/URETERAL STENT EXCHANGE ;  Surgeon: [name], MD;  [facility];  Service: Urology;  Laterality: Left;   CYSTOSCOPY W/ URETERAL STENT PLACEMENT Left 04/28/2016   Procedure: CYSTOSCOPY WITH RETROGRADE PYELOGRAM/URETERAL STENT EXCHANGE;  Surgeon: [name], MD;  [facility];  Service:  Urology;  Laterality: Left;   CYSTOSCOPY W/ URETERAL STENT PLACEMENT Left 11/08/2016   Procedure: CYSTOSCOPY WITH RETROGRADE PYELOGRAM/URETERAL STENT EXCHANGE;  Surgeon: [name], MD;  [facility];  Service: Urology;  Laterality: Left;   CYSTOSCOPY W/ URETERAL STENT PLACEMENT Left 09/14/2017   Procedure: CYSTOSCOPY WITH RETROGRADE PYELOGRAM/URETERAL STENT PLACEMENT;  Surgeon: [name], MD;  [facility];  Service: Urology;  Laterality: Left;   CYSTOSCOPY W/ URETERAL STENT PLACEMENT Left 04/10/2018   Procedure: CYSTOSCOPY WITH LEFT RETROGRADE URETERAL STENT EXCHANGE;  Surgeon: [name], MD;  Location: [facility];  Service: Urology;  Laterality: Left;   CYSTOSCOPY W/ URETERAL STENT PLACEMENT Left 01/31/2019   Procedure: CYSTOSCOPY WITH RETROGRADE PYELOGRAM/URETERAL STENT PLACEMENT;  Surgeon: Tresa Moore,  Hubbard Robinson, MD;  [facility];  Service: Urology;  Laterality: Left;  [place] PLACEMENT Left 01/16/2020   Procedure: CYSTOSCOPY WITH RETROGRADE PYELOGRAM/URETERAL STENT EXCHANGED;  Surgeon: [name], MD;  Location[facility];  Service: Urology;  Laterality: Left;   CYSTOSCOPY W/ URETERAL STENT PLACEMENT Left 04/29/2021   Procedure: CYSTOSCOPY WITH RETROGRADE PYELOGRAM/URETERAL STENT EXCHANGE;  Surgeon: [name], MD;  [facility];  Service: Urology;  Laterality: Left;   CYSTOSCOPY WITH RETROGRADE PYELOGRAM, URETEROSCOPY AND STENT PLACEMENT Left 10/21/2014   Procedure: CYSTOSCOPY WITH RETROGRADE PYELOGRAM, URETEROSCOPY,BIOPSY AND STENT CHANGE;  Surgeon: [name], MD;  [facility];  Service: Urology;  Laterality: Left;   CYSTOSCOPY/RETROGRADE/URETEROSCOPY Left 06/03/2014   Procedure: CYSTOSCOPY/RETROGRADE/URETEROSCOPY WITH BIOPSY,STENT EXCHANGE;  Surgeon: [name], MD;  [facility];  Service: Urology;  Laterality: Left;   ESOPHAGOGASTRODUODENOSCOPY N/A 09/28/2014   Procedure:  ESOPHAGOGASTRODUODENOSCOPY (EGD);  Surgeon: Beryle Beams, MD;  [facility];  Service: Endoscopy;  Laterality: N/A;   GASTRIC ROUX-EN-Y     '74   HERNIA REPAIR     bilateral hernia repairs    IR ANGIO INTRA EXTRACRAN SEL COM CAROTID INNOMINATE BILAT MOD SED  01/20/2021   IR ANGIO VERTEBRAL SEL SUBCLAVIAN INNOMINATE BILAT MOD SED  01/20/2021   IR US GUIDE VASC ACCESS RIGHT  01/20/2021   LAPAROSCOPIC APPENDECTOMY N/A 02/15/2020   Procedure: APPENDECTOMY LAPAROSCOPIC;  Surgeon: Jovita Kussmaul, MD;  [facility];  Service: General;  Laterality: N/A;   TONSILLECTOMY  as child   TOTAL HIP ARTHROPLASTY Left 05/19/2016   Procedure: LEFT TOTAL HIP ARTHROPLASTY ANTERIOR APPROACH;  Surgeon: Mcarthur Rossetti, MD;  [facility];  Service: Orthopedics;  Laterality: Left;   TRANSURETHRAL RESECTION OF PROSTATE     VAGOTOMY  1950's    MEDICATIONS:  abiraterone acetate (ZYTIGA) 250 MG tablet   acetaminophen (TYLENOL) 500 MG tablet   albuterol (VENTOLIN HFA) 108 (90 Base) MCG/ACT inhaler   Ascorbic Acid (VITAMIN C) 1000 MG tablet   aspirin EC 81 MG tablet   Camphor-Eucalyptus-Menthol (VICKS VAPORUB EX)   cyanocobalamin (,VITAMIN B-12,) 1000 MCG/ML injection   Denosumab (XGEVA [place])   diclofenac Sodium (VOLTAREN) 1 % GEL   hydroxypropyl methylcellulose / hypromellose (ISOPTO TEARS / GONIOVISC) 2.5 % ophthalmic solution   leuprolide, 6 Month, (ELIGARD) 45 MG injection   Magnesium 250 MG TABS   meclizine (ANTIVERT) 25 MG tablet   melatonin 3 MG TABS tablet   Misc Natural Products (NEURIVA PO)   Multiple Vitamin (MULTIVITAMIN WITH MINERALS) TABS tablet   Omega-3 Fatty Acids (FISH OIL) 1200 MG CAPS   OVER THE COUNTER MEDICATION   oxybutynin (DITROPAN) 5 MG tablet   PARoxetine (PAXIL) 40 MG tablet   predniSONE (DELTASONE) 5 MG tablet   senna-docusate (SENOKOT-S) 8.6-50 MG tablet   traZODone (DESYREL) 50 MG tablet   vitamin E 180 MG (400 UNITS) capsule   Zinc 50 MG TABS   No current  facility-administered medications for this encounter.   Konrad Felix Ward, PA-C [facility] [phone]

## 2022-09-06 ENCOUNTER — Other Ambulatory Visit: Payer: Self-pay

## 2022-09-06 ENCOUNTER — Ambulatory Visit (HOSPITAL_COMMUNITY)
Admission: RE | Admit: 2022-09-06 | Discharge: 2022-09-06 | Disposition: A | Discharge status: Home / Self Care | Payer: Medicare Other | Attending: Urology | Admitting: Urology

## 2022-09-06 ENCOUNTER — Encounter (HOSPITAL_COMMUNITY)
Admission: RE | Disposition: A | Discharge status: Home / Self Care | Payer: Self-pay | Source: Home / Self Care | Attending: Urology

## 2022-09-06 ENCOUNTER — Ambulatory Visit (HOSPITAL_BASED_OUTPATIENT_CLINIC_OR_DEPARTMENT_OTHER): Payer: Medicare Other | Admitting: Physician Assistant

## 2022-09-06 ENCOUNTER — Ambulatory Visit (HOSPITAL_COMMUNITY): Payer: Medicare Other

## 2022-09-06 ENCOUNTER — Ambulatory Visit (HOSPITAL_COMMUNITY): Payer: Medicare Other | Admitting: Physician Assistant

## 2022-09-06 ENCOUNTER — Encounter (HOSPITAL_COMMUNITY): Payer: Self-pay | Admitting: Urology

## 2022-09-06 DIAGNOSIS — I509 Heart failure, unspecified: Secondary | ICD-10-CM | POA: Insufficient documentation

## 2022-09-06 DIAGNOSIS — N135 Crossing vessel and stricture of ureter without hydronephrosis: Secondary | ICD-10-CM | POA: Insufficient documentation

## 2022-09-06 DIAGNOSIS — C7951 Secondary malignant neoplasm of bone: Secondary | ICD-10-CM | POA: Insufficient documentation

## 2022-09-06 DIAGNOSIS — N183 Chronic kidney disease, stage 3 unspecified: Secondary | ICD-10-CM | POA: Insufficient documentation

## 2022-09-06 DIAGNOSIS — I130 Hypertensive heart and chronic kidney disease with heart failure and stage 1 through stage 4 chronic kidney disease, or unspecified chronic kidney disease: Secondary | ICD-10-CM | POA: Insufficient documentation

## 2022-09-06 DIAGNOSIS — R32 Unspecified urinary incontinence: Secondary | ICD-10-CM | POA: Diagnosis not present

## 2022-09-06 DIAGNOSIS — N210 Calculus in bladder: Secondary | ICD-10-CM | POA: Diagnosis not present

## 2022-09-06 DIAGNOSIS — E291 Testicular hypofunction: Secondary | ICD-10-CM | POA: Insufficient documentation

## 2022-09-06 DIAGNOSIS — Z79899 Other long term (current) drug therapy: Secondary | ICD-10-CM | POA: Insufficient documentation

## 2022-09-06 DIAGNOSIS — I10 Essential (primary) hypertension: Secondary | ICD-10-CM

## 2022-09-06 DIAGNOSIS — C61 Malignant neoplasm of prostate: Secondary | ICD-10-CM

## 2022-09-06 DIAGNOSIS — Z87891 Personal history of nicotine dependence: Secondary | ICD-10-CM

## 2022-09-06 DIAGNOSIS — Z466 Encounter for fitting and adjustment of urinary device: Secondary | ICD-10-CM | POA: Insufficient documentation

## 2022-09-06 DIAGNOSIS — C799 Secondary malignant neoplasm of unspecified site: Secondary | ICD-10-CM | POA: Diagnosis not present

## 2022-09-06 DIAGNOSIS — N131 Hydronephrosis with ureteral stricture, not elsewhere classified: Secondary | ICD-10-CM | POA: Diagnosis not present

## 2022-09-06 DIAGNOSIS — Z8673 Personal history of transient ischemic attack (TIA), and cerebral infarction without residual deficits: Secondary | ICD-10-CM | POA: Insufficient documentation

## 2022-09-06 DIAGNOSIS — Z01818 Encounter for other preprocedural examination: Secondary | ICD-10-CM

## 2022-09-06 HISTORY — PX: CYSTOSCOPY W/ URETERAL STENT PLACEMENT: SHX1429

## 2022-09-06 HISTORY — PX: TRANSURETHRAL RESECTION OF PROSTATE: SHX73

## 2022-09-06 SURGERY — CYSTOSCOPY, WITH RETROGRADE PYELOGRAM AND URETERAL STENT INSERTION
Anesthesia: General | Site: Ureter

## 2022-09-06 MED ORDER — FENTANYL CITRATE (PF) 100 MCG/2ML IJ SOLN
INTRAMUSCULAR | Status: AC
  Filled 2022-09-06: qty 2

## 2022-09-06 MED ORDER — CHLORHEXIDINE GLUCONATE 0.12 % MT SOLN: 15.0000 mL | Freq: Once | OROMUCOSAL | Status: DC

## 2022-09-06 MED ORDER — ONDANSETRON HCL 4 MG/2ML IJ SOLN
INTRAMUSCULAR | Status: AC
  Filled 2022-09-06: qty 2

## 2022-09-06 MED ORDER — ONDANSETRON HCL 4 MG/2ML IJ SOLN: 4.0000 mg | Freq: Once | INTRAMUSCULAR | Status: DC | PRN

## 2022-09-06 MED ORDER — OXYCODONE HCL 5 MG PO TABS: 5.0000 mg | ORAL_TABLET | Freq: Once | ORAL | Status: DC | PRN

## 2022-09-06 MED ORDER — PHENYLEPHRINE 80 MCG/ML (10ML) SYRINGE FOR IV PUSH (FOR BLOOD PRESSURE SUPPORT)
PREFILLED_SYRINGE | INTRAVENOUS | Status: DC | PRN
  Administered 2022-09-06: 40 ug via INTRAVENOUS
  Administered 2022-09-06: 80 ug via INTRAVENOUS
  Administered 2022-09-06: 40 ug via INTRAVENOUS
  Administered 2022-09-06: 80 ug via INTRAVENOUS

## 2022-09-06 MED ORDER — DEXAMETHASONE SODIUM PHOSPHATE 10 MG/ML IJ SOLN
INTRAMUSCULAR | Status: DC | PRN
  Administered 2022-09-06: 4 mg via INTRAVENOUS

## 2022-09-06 MED ORDER — FENTANYL CITRATE (PF) 100 MCG/2ML IJ SOLN
INTRAMUSCULAR | Status: DC | PRN
  Administered 2022-09-06: 25 ug via INTRAVENOUS
  Administered 2022-09-06: 50 ug via INTRAVENOUS

## 2022-09-06 MED ORDER — IOHEXOL 300 MG/ML  SOLN
INTRAMUSCULAR | Status: DC | PRN
  Administered 2022-09-06: 9 mL via URETHRAL

## 2022-09-06 MED ORDER — PROPOFOL 10 MG/ML IV BOLUS
INTRAVENOUS | Status: AC
  Filled 2022-09-06: qty 20

## 2022-09-06 MED ORDER — LACTATED RINGERS IV SOLN: INTRAVENOUS | Status: DC

## 2022-09-06 MED ORDER — GENTAMICIN SULFATE 40 MG/ML IJ SOLN
400.0000 mg | INTRAVENOUS | Status: AC
  Administered 2022-09-06: 400 mg via INTRAVENOUS
  Filled 2022-09-06: qty 10

## 2022-09-06 MED ORDER — PROPOFOL 10 MG/ML IV BOLUS
INTRAVENOUS | Status: DC | PRN
  Administered 2022-09-06: 50 mg via INTRAVENOUS
  Administered 2022-09-06: 100 mg via INTRAVENOUS
  Administered 2022-09-06: 50 mg via INTRAVENOUS

## 2022-09-06 MED ORDER — ORAL CARE MOUTH RINSE: 15.0000 mL | Freq: Once | OROMUCOSAL | Status: DC

## 2022-09-06 MED ORDER — OXYCODONE HCL 5 MG PO TABS: 5.0000 mg | ORAL_TABLET | Freq: Three times a day (TID) | ORAL | 0 refills | Status: DC | PRN

## 2022-09-06 MED ORDER — FENTANYL CITRATE PF 50 MCG/ML IJ SOSY: 25.0000 ug | PREFILLED_SYRINGE | INTRAMUSCULAR | Status: DC | PRN

## 2022-09-06 MED ORDER — SODIUM CHLORIDE 0.9 % IR SOLN
Status: DC | PRN
  Administered 2022-09-06: 12000 mL via INTRAVESICAL

## 2022-09-06 MED ORDER — OXYCODONE HCL 5 MG/5ML PO SOLN: 5.0000 mg | Freq: Once | ORAL | Status: DC | PRN

## 2022-09-06 MED ORDER — ONDANSETRON HCL 4 MG/2ML IJ SOLN
INTRAMUSCULAR | Status: DC | PRN
  Administered 2022-09-06: 4 mg via INTRAVENOUS

## 2022-09-06 MED ORDER — EPHEDRINE SULFATE-NACL 50-0.9 MG/10ML-% IV SOSY
PREFILLED_SYRINGE | INTRAVENOUS | Status: DC | PRN
  Administered 2022-09-06 (×2): 10 mg via INTRAVENOUS

## 2022-09-06 MED ORDER — DEXAMETHASONE SODIUM PHOSPHATE 10 MG/ML IJ SOLN
INTRAMUSCULAR | Status: AC
  Filled 2022-09-06: qty 1

## 2022-09-06 MED ORDER — 0.9 % SODIUM CHLORIDE (POUR BTL) OPTIME
TOPICAL | Status: DC | PRN
  Administered 2022-09-06: 1000 mL

## 2022-09-06 MED ORDER — LIDOCAINE 2% (20 MG/ML) 5 ML SYRINGE
INTRAMUSCULAR | Status: DC | PRN
  Administered 2022-09-06: 100 mg via INTRAVENOUS

## 2022-09-06 SURGICAL SUPPLY — 33 items
BAG DRN RND TRDRP ANRFLXCHMBR (UROLOGICAL SUPPLIES) ×2
BAG URINE DRAIN 2000ML AR STRL (UROLOGICAL SUPPLIES) ×2 IMPLANT
BAG URO CATCHER STRL LF (MISCELLANEOUS) ×2 IMPLANT
BASKET ZERO TIP NITINOL 2.4FR (BASKET) IMPLANT
BSKT STON RTRVL ZERO TP 2.4FR (BASKET)
CATH FOLEY 3WAY 30CC 24FR (CATHETERS) ×2
CATH URETL OPEN END 6FR 70 (CATHETERS) IMPLANT
CATH URTH STD 24FR FL 3W 2 (CATHETERS) IMPLANT
CLOTH BEACON ORANGE TIMEOUT ST (SAFETY) ×2 IMPLANT
DRAPE FOOT SWITCH (DRAPES) ×2 IMPLANT
ELECT REM PT RETURN 15FT ADLT (MISCELLANEOUS) IMPLANT
FIBER LASER FLEXIVA 365 (UROLOGICAL SUPPLIES) IMPLANT
GLOVE SURG LX STRL 7.5 STRW (GLOVE) ×2 IMPLANT
GOWN SRG XL LVL 4 BRTHBL STRL (GOWNS) ×2 IMPLANT
GOWN STRL NON-REIN XL LVL4 (GOWNS) ×2
GOWN STRL REUS W/ TWL XL LVL3 (GOWN DISPOSABLE) ×2 IMPLANT
GOWN STRL REUS W/TWL XL LVL3 (GOWN DISPOSABLE) ×2
GUIDEWIRE ANG ZIPWIRE 038X150 (WIRE) ×2 IMPLANT
GUIDEWIRE STR DUAL SENSOR (WIRE) IMPLANT
HOLDER FOLEY CATH W/STRAP (MISCELLANEOUS) IMPLANT
IV CATH 14GX2 1/4 (CATHETERS) IMPLANT
IV NS IRRIG 3000ML ARTHROMATIC (IV SOLUTION) IMPLANT
KIT TURNOVER KIT A (KITS) IMPLANT
LOOP CUT BIPOLAR 24F LRG (ELECTROSURGICAL) IMPLANT
MANIFOLD NEPTUNE II (INSTRUMENTS) ×2 IMPLANT
PACK CYSTO (CUSTOM PROCEDURE TRAY) ×2 IMPLANT
STENT CONTOUR 7FRX26X.038 (STENTS) IMPLANT
SYR 30ML LL (SYRINGE) ×2 IMPLANT
SYR TOOMEY IRRIG 70ML (MISCELLANEOUS) ×2
SYRINGE TOOMEY IRRIG 70ML (MISCELLANEOUS) ×2 IMPLANT
TUBE PU 8FR 16IN ENFIT (TUBING) IMPLANT
TUBING CONNECTING 10 (TUBING) ×2 IMPLANT
TUBING UROLOGY SET (TUBING) ×2 IMPLANT

## 2022-09-06 NOTE — Anesthesia Postprocedure Evaluation (Signed)
Anesthesia Post Note  Patient: Steven Cain  Procedure(s) Performed: CYSTOSCOPY WITH RETROGRADE PYELOGRAM/URETERAL STENT EXCHANGE WITH LITHOLAPAXY bladder (Left: Ureter) REPEAT TRANSURETHRAL RESECTION OF THE PROSTATE (TURP) (Prostate)     Patient location during evaluation: PACU Anesthesia Type: General Level of consciousness: awake and alert Pain management: pain level controlled Vital Signs Assessment: post-procedure vital signs reviewed and stable Respiratory status: spontaneous breathing, nonlabored ventilation, respiratory function stable and patient connected to nasal cannula oxygen Cardiovascular status: blood pressure returned to baseline and stable Postop Assessment: no apparent nausea or vomiting Anesthetic complications: no  No notable events documented.  Last Vitals:  Vitals:   09/06/22 1345 09/06/22 1400  BP: (!) 194/84 (!) 179/81  Pulse: 66 73  Resp: 16 (!) 21  Temp: (!) 36.4 C   SpO2: 100% 100%    Last Pain:  Vitals:   09/06/22 1400  TempSrc:   PainSc: 0-No pain                 Ronika Kelson S

## 2022-09-06 NOTE — Discharge Instructions (Addendum)
1 - You may have urinary urgency (bladder spasms) and bloody urine on / off for about a week.  This is normal.  2 - Remove catheter at home on Friday morning. Urology office is open if you prefer to have done there.   3 - Call MD or go to ER for fever >102, severe pain / nausea / vomiting not relieved by medications, or acute change in medical status

## 2022-09-06 NOTE — H&P (Signed)
Steven Cain is an 89 y.o. male.    Chief Complaint: Pre-Op Cysto / Left Stent Exchange  HPI:   1 - Obstructing Left Ureteral Stricture - worrisome soft tissue thickening of distal left ureter by CT and operative retrograde pyelogram 05/06/14. Now s/p stenting. No bladder lesion or additional lesions by imaging. He has h/o extensive smokeless tobacco exposure. He is NOT candidate for definitive repair.   Recent Course:  05/2014 - Left Ureteroscopy / biopsy / brushing / washing / stent change (6x26)- path just mild atypia only, no frank carcinoma,  01/2020 - Stent exchange 7x26 contour  04/2021 - Stent exchage 7x26 contour   2 - Incontinence - Long h/o near total incontinence s/p TURP previously by Dr. Rosana Hoes. Manages with adult diapers x years. PVR <44m 2022. Some help with Myrbetriq nd trying daily 05/2022.   3 - Hypogonadism - Previously on exogneous androgens for indications of osteopenia per PCP. He has very poor functional status and clinical CHF and now very aggressive prostate cancer. Stopped 2021.   4 - Metastatic Prostate Cancer - 12/12 cores up to 100% grade 5 cancer by BX 05/2020 at age [age]on eval PSA 21. TRUS 26 mL, modest median lobe. CT 02/2020 (done for appendicitis) with ? T 11 sclerotic lesion, no pelvic adenopathy, bone lesion is small. BS with probable bilateral humererus, rib, axial mets 06/2020. TRUS BX 04/2020 26 mL, modest median lobe.   Most Recetn Restaging: CT, BS 01/2022 - Rt humerus, Lt femur (has rod, stable), pelvic bone mets.  Prior Therapy - TURP, Xtandi  Current Therapy - continuous androgen deprivation, Abiraterone + Pred, Xofigo   Recent Course:  05/2020 - Eligard 45  08/2020 - PSA 14.1, T <10, CMP Nl, Cr 1.6; 12/2020 - PSA 19.3, T<10, CMP Nl, Cr 1.0 ==> Eligard 45 + Start Xtandi; 03/2021 - PSA 37, T <10, CMP Nl  07/2021 - PSA 40 ==> Eligard 45, remain Xtandi  10/2021 - PSA 53.6, T <10, CMP Nl ==> change Xtandi to abiraterone + pred; 01/2022 PSA 119, T <10, CMP  Nl ==> Eligard 45 Start Xofigo infusion x6  05/2022 - PSA 126, T<10, CMP Nl  08/2022 - PSA 181, T<10, CMp Nl ==> Eligard 45, remain Abi+Pred   5 - Bone Metastasis - mutlifocalbone mets by prostate cancer staging 06/2020, progressive since. Monthly XGeva started 10/2020. Xofigo course 2023 under [facility] rad-onc.   PMH sig for Billroth II procedure R+Y, bilateral inguinal hernia repair with mesh, alcoholism (now very involved and compliant with AA) His PCP is Dr. 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 His wife ALou Miner(retired RTherapist, sports helps with most ADL's and VERY involved with all decisions.   Today Dr. HKenton Kingfisheris seen to proceed with cysto, left stent exchange for chronic stricture. And re-turp for tissue genetics.  No interval fevers. Most recent UCX non-clonal. Has been on bactrim pre-op to reduce colonization.   Past Medical History:  Diagnosis Date   Abnormal EKG    hx of left anterior fasicular block on ekg, no cardiologist   Anxiety    AR (aortic regurgitation) 03/23/2017   Mild, Noted on ECHO   Arthritis    DJD   Benign enlargement of prostate    frequent UTI, trim of prostate done , with some issues of incontinence now.   [place]    [place]    chronic- usually has phelgm often   COVID-19 08/2019  fatigue and loss of taste and smell x 10 days did monoclonal antibody therapy   Depression    Fluid retention in legs    resolved   GERD (gastroesophageal reflux disease)    rare   GI bleed    Grade I diastolic dysfunction    Hard of hearing    hear more on left side- no hearing aid   History of colon polyps    History of transfusion 2016   Hydronephrosis 2018   Left ckd stage 3 no nephrologist per wife adele   Hypertension    Moderate concentric left ventricular hypertrophy 03/23/2017   Noted on ECHO   Nausea & vomiting 06/08/2018   Perforated, severe stomach ulcer ([place]) 1974   Peripheral neuropathy    feet and toes    Pneumonia    Prostate cancer metastatic to bone [facility])    Spinal stenosis    Stroke ([place])    cerebellalr stroke   TR (tricuspid regurgitation) 03/23/2017   Mild, Noted on ECHO   Transfusion history    none recent   Ureteral stricture    CHRONIC   Urinary incontinence    UTI (urinary tract infection)    frequent   Wears glasses     Past Surgical History:  Procedure Laterality Date   ABDOMINAL SURGERY     bilroths tube '74   COLONOSCOPY N/A 09/29/2014   Procedure: COLONOSCOPY;  Surgeon: Beryle Beams, MD;  [facility];  Service: Endoscopy;  Laterality: N/A;   CYSTOSCOPY W/ URETERAL STENT PLACEMENT Left 05/06/2014   Procedure: CYSTOSCOPY WITH RETROGRADE PYELOGRAM/URETERAL STENT PLACEMENT;  Surgeon: [name], MD;  [facility];  Service: Urology;  Laterality: Left;   CYSTOSCOPY W/ URETERAL STENT PLACEMENT Left 03/31/2015   Procedure: CYSTOSCOPY WITH LEFT RETROGRADE PYELOGRAM/URETERAL STENT EXCHANGE;  Surgeon: [name], MD;  [facility];  Service: Urology;  Laterality: Left;   CYSTOSCOPY W/ URETERAL STENT PLACEMENT Left 09/15/2015   Procedure: CYSTOSCOPY WITH LEFT RETROGRADE PYELOGRAM/URETERAL STENT EXCHANGE ;  Surgeon: [name], MD;  [facility];  Service: Urology;  Laterality: Left;   CYSTOSCOPY W/ URETERAL STENT PLACEMENT Left 04/28/2016   Procedure: CYSTOSCOPY WITH RETROGRADE PYELOGRAM/URETERAL STENT EXCHANGE;  Surgeon: [name], MD;  [facility];  Service: Urology;  Laterality: Left;   CYSTOSCOPY W/ URETERAL STENT PLACEMENT Left 11/08/2016   Procedure: CYSTOSCOPY WITH RETROGRADE PYELOGRAM/URETERAL STENT EXCHANGE;  Surgeon: [name], MD;  [facility];  Service: Urology;  Laterality: Left;   CYSTOSCOPY W/ URETERAL STENT PLACEMENT Left 09/14/2017   Procedure: CYSTOSCOPY WITH RETROGRADE PYELOGRAM/URETERAL STENT PLACEMENT;  Surgeon: [name], MD;  [facility];  Service: Urology;  Laterality: Left;   CYSTOSCOPY W/  URETERAL STENT PLACEMENT Left 04/10/2018   Procedure: CYSTOSCOPY WITH LEFT RETROGRADE URETERAL STENT EXCHANGE;  Surgeon: [name], MD;  Location: [facility];  Service: Urology;  Laterality: Left;   CYSTOSCOPY W/ URETERAL STENT PLACEMENT Left 01/31/2019   Procedure: CYSTOSCOPY WITH RETROGRADE PYELOGRAM/URETERAL STENT PLACEMENT;  Surgeon: [name], MD;  [facility];  Service: Urology;  Laterality: Left;  [place] PLACEMENT Left 01/16/2020   Procedure: CYSTOSCOPY WITH RETROGRADE PYELOGRAM/URETERAL STENT EXCHANGED;  Surgeon: [name], MD;  [facility];  Service: Urology;  Laterality: Left;   CYSTOSCOPY W/ URETERAL STENT PLACEMENT Left 04/29/2021   Procedure: CYSTOSCOPY WITH RETROGRADE PYELOGRAM/URETERAL STENT EXCHANGE;  Surgeon: [name], MD;  [facility];  Service: Urology;  Laterality: Left;  CYSTOSCOPY WITH RETROGRADE PYELOGRAM, URETEROSCOPY AND STENT PLACEMENT Left 10/21/2014   Procedure: CYSTOSCOPY WITH RETROGRADE PYELOGRAM, URETEROSCOPY,BIOPSY AND STENT CHANGE;  Surgeon: [name], MD;  [facility];  Service: Urology;  Laterality: Left;   CYSTOSCOPY/RETROGRADE/URETEROSCOPY Left 06/03/2014   Procedure: CYSTOSCOPY/RETROGRADE/URETEROSCOPY WITH BIOPSY,STENT EXCHANGE;  Surgeon: [name], MD;  [facility];  Service: Urology;  Laterality: Left;   ESOPHAGOGASTRODUODENOSCOPY N/A 09/28/2014   Procedure: ESOPHAGOGASTRODUODENOSCOPY (EGD);  Surgeon: Beryle Beams, MD;  [facility];  Service: Endoscopy;  Laterality: N/A;   GASTRIC ROUX-EN-Y     '74   HERNIA REPAIR     bilateral hernia repairs    IR ANGIO INTRA EXTRACRAN SEL COM CAROTID INNOMINATE BILAT MOD SED  01/20/2021   IR ANGIO VERTEBRAL SEL SUBCLAVIAN INNOMINATE BILAT MOD SED  01/20/2021   IR US GUIDE VASC ACCESS RIGHT  01/20/2021   LAPAROSCOPIC APPENDECTOMY N/A 02/15/2020   Procedure: APPENDECTOMY LAPAROSCOPIC;  Surgeon:  Jovita Kussmaul, MD;  [facility];  Service: General;  Laterality: N/A;   TONSILLECTOMY  as child   TOTAL HIP ARTHROPLASTY Left 05/19/2016   Procedure: LEFT TOTAL HIP ARTHROPLASTY ANTERIOR APPROACH;  Surgeon: Mcarthur Rossetti, MD;  [facility];  Service: Orthopedics;  Laterality: Left;   TRANSURETHRAL RESECTION OF PROSTATE     VAGOTOMY  1950's    Family History  Problem Relation Age of Onset   Heart disease Neg Hx    Social History:  reports that he has quit smoking. His smoking use included cigarettes. He has quit using smokeless tobacco.  His smokeless tobacco use included chew. He reports that he does not currently use alcohol. He reports that he does not use drugs.  Allergies:  Allergies  Allergen Reactions   Bee Venom Swelling    No medications prior to admission.    Results for orders placed or performed during the hospital encounter of 09/04/22 (from the past 48 hour(s))  CBC per protocol     Status: None   Collection Time: 09/04/22  3:09 PM  Result Value Ref Range   WBC 9.2 4.0 - 10.5 K/uL   RBC 4.50 4.22 - 5.81 MIL/uL   Hemoglobin 13.6 13.0 - 17.0 g/dL   HCT 42.5 39.0 - 52.0 %   MCV 94.4 80.0 - 100.0 fL   MCH 30.2 26.0 - 34.0 pg   MCHC 32.0 30.0 - 36.0 g/dL   RDW 15.4 11.5 - 15.5 %   Platelets 228 150 - 400 K/uL   nRBC 0.0 0.0 - 0.2 %    Comment: Performed at [facility], [place] [address], [place], [place] [address]  Basic metabolic panel per protocol     Status: Abnormal   Collection Time: 09/04/22  3:09 PM  Result Value Ref Range   Sodium 137 135 - 145 mmol/L   Potassium 3.9 3.5 - 5.1 mmol/L   Chloride 101 98 - 111 mmol/L   CO2 27 22 - 32 mmol/L   Glucose, Bld 135 (H) 70 - 99 mg/dL    Comment: Glucose reference range applies only to samples taken after fasting for at least 8 hours.   BUN 20 8 - 23 mg/dL   Creatinine, Ser 1.59 (H) 0.61 - 1.24 mg/dL   Calcium 9.0 8.9 - 10.3 mg/dL   GFR, Estimated 41 (L) >60 mL/min    Comment:  (NOTE) Calculated using the CKD-EPI Creatinine Equation (2021)    Anion gap 9 5 - 15    Comment: Performed at [facility]  [place] [address], [place], [place] [address]   No results found.  Review of Systems  Constitutional:  Positive for fatigue. Negative for chills and fever.  Neurological:  Positive for weakness and light-headedness.  All other systems reviewed and are negative.   There were no vitals taken for this visit. Physical Exam Vitals reviewed.  Constitutional:      Comments: Stable frailty and mild dementia. Pleasant.   HENT:     Head: Normocephalic.     Nose: Nose normal.  Eyes:     Pupils: Pupils are equal, round, and reactive to light.  Cardiovascular:     Rate and Rhythm: Normal rate.  Pulmonary:     Effort: Pulmonary effort is normal.  Abdominal:     General: Abdomen is flat.  Genitourinary:    Comments: Stable hypospadias Musculoskeletal:        General: Normal range of motion.     Cervical back: Normal range of motion.  Skin:    General: Skin is warm.  Neurological:     Mental Status: He is alert.  Psychiatric:        Mood and Affect: Mood normal.      Assessment/Plan  Proceed as planned with cysto, left retrograde / stent exchange, and re-TURP (for tissue genetics). Risks, benefits, alternatives, expected peri-op course discussed previously and reiterated today.   [name], MD 09/06/2022, 7:01 AM

## 2022-09-06 NOTE — Transfer of Care (Signed)
Immediate Anesthesia Transfer of Care Note  Patient: Steven Cain  Procedure(s) Performed: CYSTOSCOPY WITH RETROGRADE PYELOGRAM/URETERAL STENT EXCHANGE WITH LITHOLAPAXY bladder (Left: Ureter) REPEAT TRANSURETHRAL RESECTION OF THE PROSTATE (TURP) (Prostate)  Patient Location: PACU  Anesthesia Type:General  Level of Consciousness: drowsy and patient cooperative  Airway & Oxygen Therapy: Patient Spontanous Breathing and Patient connected to face mask oxygen  Post-op Assessment: Report given to RN and Post -op Vital signs reviewed and stable  Post vital signs: Reviewed and stable  Last Vitals:  Vitals Value Taken Time  BP 194/84 09/06/22 1345  Temp    Pulse 72 09/06/22 1345  Resp 21 09/06/22 1345  SpO2 100 % 09/06/22 1345  Vitals shown include unvalidated device data.  Last Pain:  Vitals:   09/06/22 1038  TempSrc: Oral         Complications: No notable events documented.

## 2022-09-06 NOTE — Anesthesia Procedure Notes (Signed)
Procedure Name: LMA Insertion Date/Time: 09/06/2022 12:37 PM  Performed by: Eben Burow, CRNAPre-anesthesia Checklist: Patient identified, Emergency Drugs available, Suction available, Patient being monitored and Timeout performed Patient Re-evaluated:Patient Re-evaluated prior to induction Oxygen Delivery Method: Circle system utilized Preoxygenation: Pre-oxygenation with 100% oxygen Induction Type: IV induction Ventilation: Mask ventilation without difficulty LMA: LMA inserted LMA Size: 4.0 Number of attempts: 1 Tube secured with: Tape Dental Injury: Teeth and Oropharynx as per pre-operative assessment

## 2022-09-06 NOTE — Op Note (Signed)
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  POSTOPERATIVE DIAGNOSIS:  Metastatic prostate cancer with left malignant obstruction plus large bladder stone.  PROCEDURE PERFORMED:   1.  Cystoscopy, left retrograde pyelogram interpretation. 2.  Exchange of left ureteral stent. 3.  Cystolitholapaxy stone greater than 2 cm. 4.  Re-TURP.  ESTIMATED BLOOD LOSS:  Nil.  COMPLICATIONS:  None.  SPECIMENS:   1.  Prostate chips for permanent pathology and tumor genetics. 2.  Bladder stone fragments given to the family.  FINDINGS:   1.  Significantly distal encrusted ureteral stent, proximal unremarkable.  There was essentially a 3-4 cm bladder stone formed on the distal stent. 2.  Successful replacement of left ureteral stent, proximal end in the renal pelvis, distal end in urinary bladder. 3.  Complete resolution of all bladder stone fragments following cystolitholapaxy. 4.  Wide open prostatic fossa following re-TURP.  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  He is not a definitive operative candidate for stricture.  He is therefore managed with  chronic stenting, which he has exchange approximately every year.  It has been well over a year since his last stent exchange and he presents for this today.  He also has had progression of his metastatic prostate cancer despite second line and third  line treatment, such that any further treatment would really be guided by tumor genetics.  I therefore counseled the patient to undergo left ureteral stent exchange and also, consider repeat tissue sampling of the prostate to  allow for tumor genetics.   He presents for this today.  Informed consent was obtained and placed in medical record.  PROCEDURE IN DETAIL:  The patient being identified and verified, procedure being cystoscopy, left ureteral stent exchange and transurethral resection of the prostate was confirmed.  Procedure timeout was performed.  Intravenous antibiotics were  administered.  General LMA anesthesia introduced. The patient placed into a low lithotomy position.  Sterile field was created, prepped and draped the patient's penis, perineum, and proximal thighs using iodine.  Cystourethroscopy was performed using  21-French rigid cystoscope with offset lens.  Inspection of anterior and posterior urethra revealed a prior TURP defect with some regrowth.  It was not severe.  Inspection of urinary bladder revealed distal end of left ureteral stent, that unfortunately  was quite encrusted with approximately 3-4 cm bladder stone.  The cystoscope was exchanged for the resectoscope sheath with visual obturator and using the laser scope bridge holmium laser lithotripsy was performed.  There was a large bladder stone using  settings of 0.6 joules and 20 Hz and the stone was fragmented into approximately 20 smaller pieces that were then irrigated via the resectoscope sheath set aside for discard.  Following resolution of large bladder stone the stent was grasped, was brought  out in its entirety, inspected and intact, left ureteral orifice was then cannulated with 6-French open-ended catheter, and a left retrograde pyelogram was obtained.  Left retrograde pyelogram demonstrated single left ureter, single system left kidney.  There was narrowing of the distal ureter consistent with chronic stricture with mild hydronephrosis above this.  A 0.038 sensor wire was then advanced slowly pulling a  new 7 x 26 contour  type stent was carefully placed, proximal and distal plane were noted to be satisfactory.  Again, using the  resectoscope with a medium size loop attention was directed at tissue sampling of the prostate and transurethral resection was  performed of approximately 2-3 grams of tissue to send for tissue sampling to allow for tumor genetics and these tissue fragments were irrigated set aside for permanent pathology and the base of the resection bed was then carefully fulgurated using the  cystolitholapaxy and TURP it was felt that bladder rest catheterization will be warranted and a new 24-French 3-way Foley catheter was placed per urethra to straight drain, 10 mL water in balloon.  Irrigation port was plugged.  I do feel the patient is  adequate for discharge.  We removed the catheter in approximately 2-3 days in our office or the patient's wife who is a nurse pending her comfort level.   PUS D: 09/06/2022 1:37:41 pm T: 09/06/2022 2:04:00 pm  JOB: [id number]/ [id number]

## 2022-09-06 NOTE — Anesthesia Preprocedure Evaluation (Signed)
Anesthesia Evaluation  Patient identified by MRN, date of birth, ID band Patient awake    Reviewed: Allergy & Precautions, H&P , NPO status , Patient's Chart, lab work & pertinent test results  Airway Mallampati: II  TM Distance: >3 FB Neck ROM: Full    Dental no notable dental hx.    Pulmonary neg pulmonary ROS, former smoker   Pulmonary exam normal breath sounds clear to auscultation       Cardiovascular hypertension, Normal cardiovascular exam+ Valvular Problems/Murmurs AI  Rhythm:Regular Rate:Normal     Neuro/Psych CVA  negative psych ROS   GI/Hepatic Neg liver ROS,GERD  ,,  Endo/Other  negative endocrine ROS    Renal/GU negative Renal ROSMetastatic prostate cancer  negative genitourinary   Musculoskeletal negative musculoskeletal ROS (+)    Abdominal   Peds negative pediatric ROS (+)  Hematology negative hematology ROS (+)   Anesthesia Other Findings   Reproductive/Obstetrics negative OB ROS                             Anesthesia Physical Anesthesia Plan  ASA: 3  Anesthesia Plan: General   Post-op Pain Management: Minimal or no pain anticipated   Induction: Intravenous  PONV Risk Score and Plan: 2 and Ondansetron, Dexamethasone and Treatment may vary due to age or medical condition  Airway Management Planned: LMA  Additional Equipment:   Intra-op Plan:   Post-operative Plan: Extubation in OR  Informed Consent: I have reviewed the patients History and Physical, chart, labs and discussed the procedure including the risks, benefits and alternatives for the proposed anesthesia with the patient or authorized representative who has indicated his/her understanding and acceptance.     Dental advisory given  Plan Discussed with: CRNA and Surgeon  Anesthesia Plan Comments:        Anesthesia Quick Evaluation

## 2022-09-06 NOTE — Brief Op Note (Signed)
09/06/2022  1:31 PM  PATIENT:  Steven Cain  89 y.o. male  PRE-OPERATIVE DIAGNOSIS:  LEFT URETERAL STRICTURE, PROSTATE CANCER  POST-OPERATIVE DIAGNOSIS:  left ureteral stricture prostate cancer  PROCEDURE:  Procedure(s) with comments: CYSTOSCOPY WITH RETROGRADE PYELOGRAM/URETERAL STENT EXCHANGE WITH LITHOLAPAXY (Left) - 1 HR REPEAT TRANSURETHRAL RESECTION OF THE PROSTATE (TURP) (N/A)  SURGEON:  Surgeon(s) and Role:    * [name], MD - Primary  PHYSICIAN ASSISTANT:   ASSISTANTS: none   ANESTHESIA:   general  EBL:  minimal   BLOOD ADMINISTERED:none  DRAINS:  41F 3 way foley to gravity (irrigation port plugged, 10cc water in balloon )    LOCAL MEDICATIONS USED:  NONE  SPECIMEN:  Source of Specimen:  1 - Prostate chips (pathology + tumor genetics); 2 - Bladder stone (given to family)  DISPOSITION OF SPECIMEN:  PATHOLOGY  COUNTS:  YES  TOURNIQUET:  * No tourniquets in log *  DICTATION: .Other Dictation: Dictation Number [id number]  PLAN OF CARE: Discharge to home after PACU  PATIENT DISPOSITION:  PACU - hemodynamically stable.   Delay start of Pharmacological VTE agent (>24hrs) due to surgical blood loss or risk of bleeding: yes

## 2022-09-07 ENCOUNTER — Encounter (HOSPITAL_COMMUNITY): Payer: Self-pay | Admitting: Urology

## 2022-09-07 LAB — SURGICAL PATHOLOGY

## 2022-09-13 DIAGNOSIS — C61 Malignant neoplasm of prostate: Secondary | ICD-10-CM | POA: Diagnosis not present

## 2022-09-13 DIAGNOSIS — C7951 Secondary malignant neoplasm of bone: Secondary | ICD-10-CM | POA: Diagnosis not present

## 2022-09-18 ENCOUNTER — Encounter (HOSPITAL_COMMUNITY): Payer: Self-pay | Admitting: Urology

## 2022-09-18 DIAGNOSIS — C61 Malignant neoplasm of prostate: Secondary | ICD-10-CM | POA: Diagnosis not present

## 2022-09-18 DIAGNOSIS — C7951 Secondary malignant neoplasm of bone: Secondary | ICD-10-CM | POA: Diagnosis not present

## 2022-10-11 DIAGNOSIS — C7951 Secondary malignant neoplasm of bone: Secondary | ICD-10-CM | POA: Diagnosis not present

## 2022-10-11 DIAGNOSIS — C61 Malignant neoplasm of prostate: Secondary | ICD-10-CM | POA: Diagnosis not present

## 2022-11-08 DIAGNOSIS — C61 Malignant neoplasm of prostate: Secondary | ICD-10-CM | POA: Diagnosis not present

## 2022-11-08 DIAGNOSIS — C7951 Secondary malignant neoplasm of bone: Secondary | ICD-10-CM | POA: Diagnosis not present

## 2022-11-23 DIAGNOSIS — C61 Malignant neoplasm of prostate: Secondary | ICD-10-CM | POA: Diagnosis not present

## 2022-12-06 DIAGNOSIS — C7951 Secondary malignant neoplasm of bone: Secondary | ICD-10-CM | POA: Diagnosis not present

## 2022-12-06 DIAGNOSIS — C61 Malignant neoplasm of prostate: Secondary | ICD-10-CM | POA: Diagnosis not present

## 2022-12-22 ENCOUNTER — Encounter: Payer: Self-pay | Admitting: Hematology

## 2022-12-22 ENCOUNTER — Inpatient Hospital Stay: Payer: Medicare Other | Attending: Hematology | Admitting: Hematology

## 2022-12-22 ENCOUNTER — Other Ambulatory Visit: Payer: Self-pay

## 2022-12-22 VITALS — BP 111/81 | HR 91 | Temp 98.0°F | Resp 18 | Ht 63.0 in | Wt 158.6 lb

## 2022-12-22 DIAGNOSIS — Z808 Family history of malignant neoplasm of other organs or systems: Secondary | ICD-10-CM

## 2022-12-22 DIAGNOSIS — Z800 Family history of malignant neoplasm of digestive organs: Secondary | ICD-10-CM | POA: Diagnosis not present

## 2022-12-22 DIAGNOSIS — C7951 Secondary malignant neoplasm of bone: Secondary | ICD-10-CM | POA: Diagnosis not present

## 2022-12-22 DIAGNOSIS — C61 Malignant neoplasm of prostate: Secondary | ICD-10-CM | POA: Insufficient documentation

## 2022-12-22 DIAGNOSIS — Z87891 Personal history of nicotine dependence: Secondary | ICD-10-CM | POA: Insufficient documentation

## 2022-12-22 DIAGNOSIS — Z8042 Family history of malignant neoplasm of prostate: Secondary | ICD-10-CM | POA: Insufficient documentation

## 2022-12-22 NOTE — Progress Notes (Signed)
[facility]   [phone] [phone]   Clinic New Consult Note   Patient Care Team: [name], MD as PCP - General (Internal Medicine) Cherlyn Cushing, RN as Oncology Nurse Navigator 12/22/2022  CHIEF COMPLAINTS/PURPOSE OF CONSULTATION:  Prostate cancer metastatic to bone  REFERRAL PHYSICIAN: Dr. Berneice Heinrich   HISTORY OF PRESENTING ILLNESS:  Steven Cain 89 y.o. male with PMH of chronic bronchitis, history of heavy smoking and alcohol addiction, GERD, hard of hearing, anxiety, was referred by his urologist Dr. Berneice Heinrich to discuss treatment options for his metastatic prostate cancer.  He is accompanied by his wife, who is a Engineer, civil (consulting).  He is a retired Field seismologist.  He came in a wheelchair today.  He was diagnosed with metastatic prostate cancer in October 2021, baseline PSA 21, TRUS 26 mL, CT scan in July 2021 showed probable T11 sclerotic lesion, no adenopathy or other visual metastasis.  Bone scan showed probable bilateral humerus, rib, axial mets.  Underwent prostate biopsy in September 2021 confirmed prostate cancer.  He was started on androgen deprivation therapy with Eligard, Zytiga and prednisone.  He initially responded with PSA dropped to 2-3, but subsequently his PSA has been gradually trending up, despite total testosterone level less than 10.  His PSA was 19.3 in April 2022, went up to 318 on April 2024.  Last CT scan in June 2023 showed interval increase in multiplicity and size of the sclerotic bone lesions, no signs of solid organ or node metastasis.  Patient has been clinically stable, he has intermittent joint pain, overall mild and tolerable, no worse lately.  Due to his medical comorbidities, he has been using wheelchair and a walker for a while.  His other GU problems also include obstructing left ureteral stricture, status post multiple stent placement and exchange.  He has urinary incontinence.    MEDICAL HISTORY:  Past Medical  History:  Diagnosis Date   Abnormal EKG    hx of left anterior fasicular block on ekg, no cardiologist   Anxiety    AR (aortic regurgitation) 03/23/2017   Mild, Noted on ECHO   Arthritis    DJD   Benign enlargement of prostate    frequent UTI, trim of prostate done , with some issues of incontinence now.   Cancer (HCC)    Chronic bronchitis (HCC)    chronic- usually has phelgm often   COVID-19 08/2019   fatigue and loss of taste and smell x 10 days did monoclonal antibody therapy   Depression    Fluid retention in legs    resolved   GERD (gastroesophageal reflux disease)    rare   GI bleed    Grade I diastolic dysfunction    Hard of hearing    hear more on left side- no hearing aid   History of colon polyps    History of transfusion 2016   Hydronephrosis 2018   Left ckd stage 3 no nephrologist per wife adele   Hypertension    Moderate concentric left ventricular hypertrophy 03/23/2017   Noted on ECHO   Nausea & vomiting 06/08/2018   Perforated, severe stomach ulcer (HCC) 1974   Peripheral neuropathy    feet and toes   Pneumonia    Prostate cancer metastatic to bone [facility])    Spinal stenosis    Stroke (HCC)    cerebellalr stroke   TR (tricuspid regurgitation) 03/23/2017   Mild, Noted on ECHO   Transfusion history    none recent  Ureteral stricture    CHRONIC   Urinary incontinence    UTI (urinary tract infection)    frequent   Wears glasses     SURGICAL HISTORY: Past Surgical History:  Procedure Laterality Date   ABDOMINAL SURGERY     bilroths tube '74   APPENDECTOMY     COLONOSCOPY N/A 09/29/2014   Procedure: COLONOSCOPY;  Surgeon: Theda Belfast, MD;  Location: [facility];  Service: Endoscopy;  Laterality: N/A;   CYSTOSCOPY W/ URETERAL STENT PLACEMENT Left 05/06/2014   Procedure: CYSTOSCOPY WITH RETROGRADE PYELOGRAM/URETERAL STENT PLACEMENT;  Surgeon: Sebastian Ache, MD;  Location: [facility];  Service: Urology;  Laterality: Left;   CYSTOSCOPY W/ URETERAL  STENT PLACEMENT Left 03/31/2015   Procedure: CYSTOSCOPY WITH LEFT RETROGRADE PYELOGRAM/URETERAL STENT EXCHANGE;  Surgeon: Sebastian Ache, MD;  Location: [facility];  Service: Urology;  Laterality: Left;   CYSTOSCOPY W/ URETERAL STENT PLACEMENT Left 09/15/2015   Procedure: CYSTOSCOPY WITH LEFT RETROGRADE PYELOGRAM/URETERAL STENT EXCHANGE ;  Surgeon: Sebastian Ache, MD;  Location: [facility];  Service: Urology;  Laterality: Left;   CYSTOSCOPY W/ URETERAL STENT PLACEMENT Left 04/28/2016   Procedure: CYSTOSCOPY WITH RETROGRADE PYELOGRAM/URETERAL STENT EXCHANGE;  Surgeon: Sebastian Ache, MD;  Location: [facility];  Service: Urology;  Laterality: Left;   CYSTOSCOPY W/ URETERAL STENT PLACEMENT Left 11/08/2016   Procedure: CYSTOSCOPY WITH RETROGRADE PYELOGRAM/URETERAL STENT EXCHANGE;  Surgeon: Sebastian Ache, MD;  Location: [facility];  Service: Urology;  Laterality: Left;   CYSTOSCOPY W/ URETERAL STENT PLACEMENT Left 09/14/2017   Procedure: CYSTOSCOPY WITH RETROGRADE PYELOGRAM/URETERAL STENT PLACEMENT;  Surgeon: Sebastian Ache, MD;  Location: [facility];  Service: Urology;  Laterality: Left;   CYSTOSCOPY W/ URETERAL STENT PLACEMENT Left 04/10/2018   Procedure: CYSTOSCOPY WITH LEFT RETROGRADE URETERAL STENT EXCHANGE;  Surgeon: Sebastian Ache, MD;  Location[facility];  Service: Urology;  Laterality: Left;   CYSTOSCOPY W/ URETERAL STENT PLACEMENT Left 01/31/2019   Procedure: CYSTOSCOPY WITH RETROGRADE PYELOGRAM/URETERAL STENT PLACEMENT;  Surgeon: Sebastian Ache, MD;  Location: [facility];  Service: Urology;  Laterality: Left;  45 MINS   CYSTOSCOPY W/ URETERAL STENT PLACEMENT Left 01/16/2020   Procedure: CYSTOSCOPY WITH RETROGRADE PYELOGRAM/URETERAL STENT EXCHANGED;  Surgeon: Sebastian Ache, MD;  Location: [facility];  Service: Urology;  Laterality: Left;   CYSTOSCOPY W/ URETERAL STENT PLACEMENT Left 04/29/2021   Procedure: CYSTOSCOPY WITH RETROGRADE PYELOGRAM/URETERAL STENT EXCHANGE;  Surgeon:  Sebastian Ache, MD;  Location: [facility];  Service: Urology;  Laterality: Left;   CYSTOSCOPY W/ URETERAL STENT PLACEMENT Left 09/06/2022   Procedure: CYSTOSCOPY WITH RETROGRADE PYELOGRAM/URETERAL STENT EXCHANGE WITH LITHOLAPAXY bladder;  Surgeon: Sebastian Ache, MD;  Location: [facility];  Service: Urology;  Laterality: Left;  1 HR   CYSTOSCOPY WITH RETROGRADE PYELOGRAM, URETEROSCOPY AND STENT PLACEMENT Left 10/21/2014   Procedure: CYSTOSCOPY WITH RETROGRADE PYELOGRAM, URETEROSCOPY,BIOPSY AND STENT CHANGE;  Surgeon: Sebastian Ache, MD;  Location: [facility];  Service: Urology;  Laterality: Left;   CYSTOSCOPY/RETROGRADE/URETEROSCOPY Left 06/03/2014   Procedure: CYSTOSCOPY/RETROGRADE/URETEROSCOPY WITH BIOPSY,STENT EXCHANGE;  Surgeon: Sebastian Ache, MD;  Location: [facility];  Service: Urology;  Laterality: Left;   ESOPHAGOGASTRODUODENOSCOPY N/A 09/28/2014   Procedure: ESOPHAGOGASTRODUODENOSCOPY (EGD);  Surgeon: Theda Belfast, MD;  Location: [facility];  Service: Endoscopy;  Laterality: N/A;   GASTRIC ROUX-EN-Y     '74   HERNIA REPAIR     bilateral hernia repairs    IR ANGIO INTRA EXTRACRAN SEL COM CAROTID INNOMINATE BILAT MOD SED  01/20/2021   IR ANGIO VERTEBRAL SEL SUBCLAVIAN INNOMINATE BILAT MOD SED  01/20/2021   IR US GUIDE VASC ACCESS RIGHT  01/20/2021   LAPAROSCOPIC APPENDECTOMY N/A 02/15/2020   Procedure: APPENDECTOMY LAPAROSCOPIC;  Surgeon: Griselda Miner, MD;  Location: [facility];  Service: General;  Laterality: N/A;   TONSILLECTOMY  as child   TOTAL HIP ARTHROPLASTY Left 05/19/2016   Procedure: LEFT TOTAL HIP ARTHROPLASTY ANTERIOR APPROACH;  Surgeon: Kathryne Hitch, MD;  Location: [facility];  Service: Orthopedics;  Laterality: Left;   TRANSURETHRAL RESECTION OF PROSTATE     TRANSURETHRAL RESECTION OF PROSTATE N/A 09/06/2022   Procedure: REPEAT TRANSURETHRAL RESECTION OF THE PROSTATE (TURP);  Surgeon: Sebastian Ache, MD;  Location: [facility];  Service: Urology;  Laterality: N/A;   VAGOTOMY   1950's    SOCIAL HISTORY: Social History   Socioeconomic History   Marital status: Married    Spouse name: Not on file   Number of children: 6   Years of education: Not on file   Highest education level: Not on file  Occupational History   Not on file  Tobacco Use   Smoking status: Former    Types: Cigarettes   Smokeless tobacco: Former    Types: Chew   Tobacco comments:    quit yrs ago  Vaping Use   Vaping Use: Never used  Substance and Sexual Activity   Alcohol use: Not Currently    Comment: used to drink alcohol heavily and was treated, sober since age 56   Drug use: No   Sexual activity: Not Currently  Other Topics Concern   Not on file  Social History Narrative   Lives with wife and grandchildren   Right Handed   Drink 1-2 cups caffeine daily   Social Determinants of Health   Financial Resource Strain: Not on file  Food Insecurity: Not on file  Transportation Needs: Not on file  Physical Activity: Not on file  Stress: Not on file  Social Connections: Not on file  Intimate Partner Violence: Not on file    FAMILY HISTORY: Family History  Problem Relation Age of Onset   Cancer Mother        colon cancer   Cancer Father        prostate cancer   Cancer Brother        thyroid cancer   Cancer Daughter 21       breast cancer   Heart disease Neg Hx     ALLERGIES:  is allergic to bee venom.  MEDICATIONS:  Current Outpatient Medications  Medication Sig Dispense Refill   abiraterone acetate (ZYTIGA) 250 MG tablet Take 1,000 mg by mouth daily.     acetaminophen (TYLENOL) 500 MG tablet Take 1 tablet (500 mg total) by mouth every 6 (six) hours as needed for mild pain. (Patient taking differently: Take 1,000 mg by mouth every 6 (six) hours as needed for mild pain.)     albuterol (VENTOLIN HFA) 108 (90 Base) MCG/ACT inhaler Inhale 2 puffs into the lungs every 6 (six) hours as needed for wheezing or shortness of breath.     Ascorbic Acid (VITAMIN C) 1000 MG  tablet Take 1,000 mg by mouth daily.     aspirin EC 81 MG tablet Take 81 mg by mouth daily.     Camphor-Eucalyptus-Menthol (VICKS VAPORUB EX) Apply 1 Application topically daily as needed (congestion).     cyanocobalamin (,VITAMIN B-12,) 1000 MCG/ML injection Inject 1,000 mcg into the muscle every 30 (thirty) days.     Denosumab (XGEVA [place]) Inject 1 Dose into the skin  every 28 (twenty-eight) days.     diclofenac Sodium (VOLTAREN) 1 % GEL Apply 1 Application topically daily as needed (pain).     hydroxypropyl methylcellulose / hypromellose (ISOPTO TEARS / GONIOVISC) 2.5 % ophthalmic solution Place 1 drop into both eyes daily as needed for dry eyes.     leuprolide, 6 Month, (ELIGARD) 45 MG injection Inject 45 mg into the skin every 6 (six) months.     Magnesium 250 MG TABS Take 250 mg by mouth daily.     meclizine (ANTIVERT) 25 MG tablet Take 25 mg by mouth See admin instructions. Take 25 mg daily, may take a second 25 mg dose as needed for dizziness     melatonin 3 MG TABS tablet Take 3 mg by mouth at bedtime.     Misc Natural Products (NEURIVA PO) Take 1 capsule by mouth daily.     Multiple Vitamin (MULTIVITAMIN WITH MINERALS) TABS tablet Take 1 tablet by mouth every morning.     Omega-3 Fatty Acids (FISH OIL) 1200 MG CAPS Take 1,200 mg by mouth daily.     OVER THE COUNTER MEDICATION Take 2 capsules by mouth daily. Dynamic brain otc supplement     oxybutynin (DITROPAN) 5 MG tablet Take 5 mg by mouth 2 (two) times daily.     oxyCODONE (ROXICODONE) 5 MG immediate release tablet Take 1 tablet (5 mg total) by mouth every 8 (eight) hours as needed. For post-op pain and cancer pain 20 tablet 0   PARoxetine (PAXIL) 40 MG tablet Take 1 tablet (40 mg total) by mouth daily. (Patient taking differently: Take 40 mg by mouth at bedtime.) 30 tablet 11   predniSONE (DELTASONE) 5 MG tablet Take 5 mg by mouth daily.     senna-docusate (SENOKOT-S) 8.6-50 MG tablet Take 1 tablet by mouth at bedtime. (Patient taking  differently: Take 2 tablets by mouth 2 (two) times daily.) 30 tablet 0   traZODone (DESYREL) 50 MG tablet Take 50 mg by mouth at bedtime.     vitamin E 180 MG (400 UNITS) capsule Take 400 Units by mouth every Wednesday.     Zinc 50 MG TABS Take 50 mg by mouth daily.     No current facility-administered medications for this visit.    REVIEW OF SYSTEMS:   Constitutional: Denies fevers, chills or abnormal night sweats Eyes: Denies blurriness of vision, double vision or watery eyes Ears, nose, mouth, throat, and face: Denies mucositis or sore throat Respiratory: Bilateral rails, chronic cough, dyspnea or wheezes Cardiovascular: Denies palpitation, chest discomfort or lower extremity swelling Gastrointestinal:  Denies nausea, heartburn or change in bowel habits Skin: Denies abnormal skin rashes Lymphatics: Denies new lymphadenopathy or easy bruising Neurological:Denies numbness, tingling or new weaknesses Behavioral/Psych: Mood is stable, no new changes  All other systems were reviewed with the patient and are negative.  PHYSICAL EXAMINATION: ECOG PERFORMANCE STATUS: 2 - Symptomatic, <50% confined to bed  Vitals:   12/22/22 1130  BP: 111/81  Pulse: 91  Resp: 18  Temp: 98 F (36.7 C)  SpO2: 99%   Filed Weights   12/22/22 1130  Weight: 158 lb 9.6 oz (71.9 kg)    GENERAL:alert, no distress and comfortable SKIN: skin color, texture, turgor are normal, no rashes or significant lesions EYES: normal, conjunctiva are pink and non-injected, sclera clear OROPHARYNX:no exudate, no erythema and lips, buccal mucosa, and tongue normal  NECK: supple, thyroid normal size, non-tender, without nodularity LYMPH:  no palpable lymphadenopathy in the cervical, axillary or inguinal LUNGS:  clear to auscultation and percussion with normal breathing effort HEART: regular rate & rhythm and no murmurs and no lower extremity edema ABDOMEN:abdomen soft, non-tender and normal bowel  sounds Musculoskeletal:no cyanosis of digits and no clubbing  PSYCH: alert & oriented x 3 with fluent speech NEURO: no focal motor/sensory deficits  LABORATORY DATA:  I have reviewed the data as listed    Latest Ref Rng & Units 09/04/2022    3:09 PM 08/03/2022   12:03 PM 07/04/2022   12:10 PM  CBC  WBC 4.0 - 10.5 K/uL 9.2  8.4  6.5   Hemoglobin 13.0 - 17.0 g/dL 16.1  09.6  04.5   Hematocrit 39.0 - 52.0 % 42.5  40.4  41.6   Platelets 150 - 400 K/uL 228  240  227      RADIOGRAPHIC STUDIES: I have personally reviewed the radiological images as listed and agreed with the findings in the report. No results found.  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  He had a brief response initially, then his PSA has been steadily increasing, with recent PSA 318 on November 23, 2022.  His testosterone level has been undetectable. -He has clearly developed castration resistance. -I recommend a PSMA PET scan to evaluate the extent of his disease -I discussed the nature history of metastatic prostate cancer, and his overall poor prognosis by his overall poor response to ADT -I discussed other systemic treatment options.  He has previously received Xofigo injections which did not help.  Due to his advanced age and medical comorbidities, I do not think he is a candidate for chemotherapy such as docetaxel.  I recommend him to consider Pluvicto therapy, I discussed the benefit and potential side effects with him.  He is interested, we will refer him to nuclear medicine Dr. Peterson Lombard.  -He will continue ADT under his urologist Dr. Kathrynn Running, okay to stop Zytiga and prednisone since his disease has progressed. -I also reviewed his foundation 1 report, which showed PTEN loss, and TMPRSS2 TMPRSS2-ERG fusion.  Microsatellite status  in the tumor mutation burden was not able to be determined, unfortunately he has no targeted therapy options based on the results. -I recommended germline genetic mutation -He has poor performance status, I recommend palliative home care service, patient and his wife agree.  We also reviewed hospice home care down the road when he progressed further and more deconditioned.    Plan: - recommend Genetic testing, will refer him - order PSMA PET Scan to be done in the next 2 to 3 weeks - referral for at home Palliative Care - discontinue Zytiga and prednisone  -refer to nuclear medicine Dr. Amil Amen to consider Pluvicto - f/u in 3 months   Orders Placed This Encounter  Procedures   Ambulatory referral to Genetics    Referral Priority:   Routine    Referral Type:   Consultation    Referral Reason:   Specialty Services Required    Number of Visits Requested:   1    All questions were answered. The patient knows to call the clinic with any problems, questions or concerns. I spent 50 minutes counseling the patient face to face. The total time spent in the appointment was 60 minutes and more than 50% was on counseling.     Malachy Mood, MD 12/22/2022 5:51 PM

## 2022-12-25 ENCOUNTER — Other Ambulatory Visit: Payer: Self-pay

## 2022-12-25 ENCOUNTER — Other Ambulatory Visit: Payer: Self-pay | Admitting: Hematology

## 2022-12-25 DIAGNOSIS — C61 Malignant neoplasm of prostate: Secondary | ICD-10-CM

## 2022-12-26 NOTE — Progress Notes (Signed)
RN left message with patient's wife requesting call back regarding upcoming PSMA PET appointment.

## 2022-12-28 ENCOUNTER — Other Ambulatory Visit: Payer: Self-pay

## 2022-12-28 NOTE — Progress Notes (Signed)
RN left message for call back regarding PSMA PET scan.

## 2023-01-02 NOTE — Progress Notes (Signed)
RN left message for call back to reviewing upcoming PSMA PET appointment time, date, and instructions.

## 2023-01-03 DIAGNOSIS — C61 Malignant neoplasm of prostate: Secondary | ICD-10-CM | POA: Diagnosis not present

## 2023-01-03 DIAGNOSIS — C7951 Secondary malignant neoplasm of bone: Secondary | ICD-10-CM | POA: Diagnosis not present

## 2023-01-12 ENCOUNTER — Encounter (HOSPITAL_COMMUNITY)
Admission: RE | Admit: 2023-01-12 | Discharge: 2023-01-12 | Disposition: A | Discharge status: Home / Self Care | Payer: Medicare Other | Source: Ambulatory Visit | Attending: Hematology | Admitting: Hematology

## 2023-01-12 ENCOUNTER — Inpatient Hospital Stay: Payer: Medicare Other

## 2023-01-12 DIAGNOSIS — C61 Malignant neoplasm of prostate: Secondary | ICD-10-CM

## 2023-01-12 DIAGNOSIS — C7951 Secondary malignant neoplasm of bone: Secondary | ICD-10-CM | POA: Insufficient documentation

## 2023-01-12 LAB — COMPREHENSIVE METABOLIC PANEL
ALT: 6 U/L (ref 0–44)
AST: 15 U/L (ref 15–41)
Albumin: 3.8 g/dL (ref 3.5–5.0)
Alkaline Phosphatase: 153 U/L — ABNORMAL HIGH (ref 38–126)
Anion gap: 7 (ref 5–15)
BUN: 17 mg/dL (ref 8–23)
CO2: 31 mmol/L (ref 22–32)
Calcium: 9.2 mg/dL (ref 8.9–10.3)
Chloride: 98 mmol/L (ref 98–111)
Creatinine, Ser: 1.59 mg/dL — ABNORMAL HIGH (ref 0.61–1.24)
GFR, Estimated: 41 mL/min — ABNORMAL LOW (ref >60)
Glucose, Bld: 113 mg/dL — ABNORMAL HIGH (ref 70–99)
Potassium: 4.8 mmol/L (ref 3.5–5.1)
Sodium: 136 mmol/L (ref 135–145)
Total Bilirubin: 0.4 mg/dL (ref 0.3–1.2)
Total Protein: 7.2 g/dL (ref 6.5–8.1)

## 2023-01-12 LAB — CBC WITH DIFFERENTIAL/PLATELET
Abs Immature Granulocytes: 0.03 10*3/uL (ref 0.00–0.07)
Basophils Absolute: 0.1 10*3/uL (ref 0.0–0.1)
Basophils Relative: 1 %
Eosinophils Absolute: 0.2 10*3/uL (ref 0.0–0.5)
Eosinophils Relative: 3 %
HCT: 39.6 % (ref 39.0–52.0)
Hemoglobin: 13 g/dL (ref 13.0–17.0)
Immature Granulocytes: 0 %
Lymphocytes Relative: 28 %
Lymphs Abs: 2.2 10*3/uL (ref 0.7–4.0)
MCH: 30 pg (ref 26.0–34.0)
MCHC: 32.8 g/dL (ref 30.0–36.0)
MCV: 91.2 fL (ref 80.0–100.0)
Monocytes Absolute: 0.5 10*3/uL (ref 0.1–1.0)
Monocytes Relative: 7 %
Neutro Abs: 4.8 10*3/uL (ref 1.7–7.7)
Neutrophils Relative %: 61 %
Platelets: 252 10*3/uL (ref 150–400)
RBC: 4.34 MIL/uL (ref 4.22–5.81)
RDW: 14 % (ref 11.5–15.5)
WBC: 7.8 10*3/uL (ref 4.0–10.5)
nRBC: 0 % (ref 0.0–0.2)

## 2023-01-12 NOTE — Progress Notes (Signed)
RN returned call and confirmed upcoming appointment time and dates with patient's wife.

## 2023-01-14 LAB — PROSTATE-SPECIFIC AG, SERUM (LABCORP): Prostate Specific Ag, Serum: 431 ng/mL — ABNORMAL HIGH (ref 0.0–4.0)

## 2023-01-14 NOTE — Assessment & Plan Note (Signed)
Diagnosed with metastatic prostate cancer to bones in October 2021 -He has been on ADT, ID, and prednisone since then.  He had a brief response initially, then his PSA has been steadily increasing, with recent PSA 318 on November 23, 2022.  His testosterone level has been undetectable. -He has clearly developed castration resistance. -I recommend a PSMA PET scan to evaluate the extent of his disease -I discussed the nature history of metastatic prostate cancer, and his overall poor prognosis by his overall poor response to ADT -I discussed other systemic treatment options.  He has previously received Xofigo injections which did not help.  Due to his advanced age and medical comorbidities, I do not think he is a candidate for chemotherapy such as docetaxel.  I will refer him to Dr. Peterson Lombard to see if he is a candidate for Pluvicto, his CKD maybe a problem for him to receive Pluvicto.  -He will continue ADT under his urologist Dr. Kathrynn Running, okay to stop Zytiga and prednisone since his disease has progressed. -I previously reviewed his [employer] report, which showed PTEN loss, and TMPRSS2 TMPRSS2-ERG fusion.  Microsatellite status in the tumor mutation burden was not able to be determined, unfortunately he has no targeted therapy options based on the results. -I recommended germline genetic mutation -He has poor performance status, I recommend palliative home care service, patient and his wife agree.

## 2023-01-15 ENCOUNTER — Inpatient Hospital Stay: Payer: Medicare Other | Attending: Hematology | Admitting: Hematology

## 2023-01-15 DIAGNOSIS — Z79899 Other long term (current) drug therapy: Secondary | ICD-10-CM | POA: Insufficient documentation

## 2023-01-15 DIAGNOSIS — C7951 Secondary malignant neoplasm of bone: Secondary | ICD-10-CM | POA: Insufficient documentation

## 2023-01-15 DIAGNOSIS — C61 Malignant neoplasm of prostate: Secondary | ICD-10-CM | POA: Insufficient documentation

## 2023-01-22 ENCOUNTER — Telehealth: Payer: Self-pay | Admitting: Hematology

## 2023-01-22 NOTE — Telephone Encounter (Signed)
Contacted patient to scheduled appointments. Left message with appointment details and a call back number if patient had any questions or could not accommodate the time we provided.   

## 2023-01-26 ENCOUNTER — Encounter: Payer: Self-pay | Admitting: Hematology

## 2023-01-26 ENCOUNTER — Inpatient Hospital Stay (HOSPITAL_BASED_OUTPATIENT_CLINIC_OR_DEPARTMENT_OTHER): Payer: Medicare Other | Admitting: Hematology

## 2023-01-26 ENCOUNTER — Encounter: Payer: Self-pay | Admitting: Genetic Counselor

## 2023-01-26 ENCOUNTER — Inpatient Hospital Stay (HOSPITAL_BASED_OUTPATIENT_CLINIC_OR_DEPARTMENT_OTHER): Payer: Medicare Other | Admitting: Genetic Counselor

## 2023-01-26 ENCOUNTER — Other Ambulatory Visit: Payer: Self-pay | Admitting: Genetic Counselor

## 2023-01-26 ENCOUNTER — Other Ambulatory Visit: Payer: Self-pay

## 2023-01-26 ENCOUNTER — Inpatient Hospital Stay: Payer: Medicare Other

## 2023-01-26 VITALS — BP 183/103 | HR 86 | Temp 98.3°F | Resp 18 | Wt 158.4 lb

## 2023-01-26 DIAGNOSIS — C61 Malignant neoplasm of prostate: Secondary | ICD-10-CM

## 2023-01-26 DIAGNOSIS — Z1379 Encounter for other screening for genetic and chromosomal anomalies: Secondary | ICD-10-CM

## 2023-01-26 DIAGNOSIS — Z800 Family history of malignant neoplasm of digestive organs: Secondary | ICD-10-CM | POA: Diagnosis not present

## 2023-01-26 DIAGNOSIS — Z79899 Other long term (current) drug therapy: Secondary | ICD-10-CM | POA: Diagnosis not present

## 2023-01-26 DIAGNOSIS — C7951 Secondary malignant neoplasm of bone: Secondary | ICD-10-CM

## 2023-01-26 DIAGNOSIS — Z803 Family history of malignant neoplasm of breast: Secondary | ICD-10-CM | POA: Diagnosis not present

## 2023-01-26 DIAGNOSIS — Z8546 Personal history of malignant neoplasm of prostate: Secondary | ICD-10-CM | POA: Diagnosis not present

## 2023-01-26 DIAGNOSIS — Z8042 Family history of malignant neoplasm of prostate: Secondary | ICD-10-CM | POA: Diagnosis not present

## 2023-01-26 LAB — CBC WITH DIFFERENTIAL/PLATELET
Abs Immature Granulocytes: 0.04 10*3/uL (ref 0.00–0.07)
Basophils Absolute: 0.1 10*3/uL (ref 0.0–0.1)
Basophils Relative: 1 %
Eosinophils Absolute: 0.3 10*3/uL (ref 0.0–0.5)
Eosinophils Relative: 3 %
HCT: 40.3 % (ref 39.0–52.0)
Hemoglobin: 13.1 g/dL (ref 13.0–17.0)
Immature Granulocytes: 0 %
Lymphocytes Relative: 23 %
Lymphs Abs: 2.3 10*3/uL (ref 0.7–4.0)
MCH: 29.8 pg (ref 26.0–34.0)
MCHC: 32.5 g/dL (ref 30.0–36.0)
MCV: 91.6 fL (ref 80.0–100.0)
Monocytes Absolute: 0.7 10*3/uL (ref 0.1–1.0)
Monocytes Relative: 7 %
Neutro Abs: 6.5 10*3/uL (ref 1.7–7.7)
Neutrophils Relative %: 66 %
Platelets: 229 10*3/uL (ref 150–400)
RBC: 4.4 MIL/uL (ref 4.22–5.81)
RDW: 14.3 % (ref 11.5–15.5)
WBC: 9.9 10*3/uL (ref 4.0–10.5)
nRBC: 0 % (ref 0.0–0.2)

## 2023-01-26 LAB — COMPREHENSIVE METABOLIC PANEL
ALT: 5 U/L (ref 0–44)
AST: 14 U/L — ABNORMAL LOW (ref 15–41)
Albumin: 3.6 g/dL (ref 3.5–5.0)
Alkaline Phosphatase: 145 U/L — ABNORMAL HIGH (ref 38–126)
Anion gap: 7 (ref 5–15)
BUN: 23 mg/dL (ref 8–23)
CO2: 29 mmol/L (ref 22–32)
Calcium: 9.4 mg/dL (ref 8.9–10.3)
Chloride: 98 mmol/L (ref 98–111)
Creatinine, Ser: 1.27 mg/dL — ABNORMAL HIGH (ref 0.61–1.24)
GFR, Estimated: 54 mL/min — ABNORMAL LOW (ref >60)
Glucose, Bld: 108 mg/dL — ABNORMAL HIGH (ref 70–99)
Potassium: 4.2 mmol/L (ref 3.5–5.1)
Sodium: 134 mmol/L — ABNORMAL LOW (ref 135–145)
Total Bilirubin: 0.4 mg/dL (ref 0.3–1.2)
Total Protein: 6.8 g/dL (ref 6.5–8.1)

## 2023-01-26 LAB — GENETIC SCREENING ORDER

## 2023-01-26 NOTE — Progress Notes (Signed)
REFERRING PROVIDER: Malachy Mood, MD  PRIMARY PROVIDER:  [name], MD  PRIMARY REASON FOR VISIT:  Encounter Diagnoses  Name Primary?   Prostate cancer metastatic to bone [facility]) Yes   Family history of breast cancer    Family history of colon cancer    Family history of pancreatic cancer    Family history of prostate cancer    HISTORY OF PRESENT ILLNESS:   Mr. Brockschmidt, a 89 y.o. male, was seen for a [place] cancer genetics consultation at the request of Dr. Mosetta Putt due to a personal history of prostate cancer.  Mr. Jenson presents to clinic today to discuss the possibility of a hereditary predisposition to cancer, to discuss genetic testing, and to further clarify his future cancer risks, as well as potential cancer risks for family members.   Mr. Quirindongo was diagnosed with metastatic prostate cancer in October 2021.   Past Medical History:  Diagnosis Date   Abnormal EKG    hx of left anterior fasicular block on ekg, no cardiologist   Anxiety    AR (aortic regurgitation) 03/23/2017   Mild, Noted on ECHO   Arthritis    DJD   Benign enlargement of prostate    frequent UTI, trim of prostate done , with some issues of incontinence now.   Cancer (HCC)    Chronic bronchitis (HCC)    chronic- usually has phelgm often   COVID-19 08/2019   fatigue and loss of taste and smell x 10 days did monoclonal antibody therapy   Depression    Fluid retention in legs    resolved   GERD (gastroesophageal reflux disease)    rare   GI bleed    Grade I diastolic dysfunction    Hard of hearing    hear more on left side- no hearing aid   History of colon polyps    History of transfusion 2016   Hydronephrosis 2018   Left ckd stage 3 no nephrologist per wife [place]   Hypertension    Moderate concentric left ventricular hypertrophy 03/23/2017   Noted on ECHO   Nausea & vomiting 06/08/2018   Perforated, severe stomach ulcer (HCC) 1974   Peripheral neuropathy    feet and toes   Pneumonia     Prostate cancer metastatic to bone [facility])    Spinal stenosis    Stroke (HCC)    cerebellalr stroke   TR (tricuspid regurgitation) 03/23/2017   Mild, Noted on ECHO   Transfusion history    none recent   Ureteral stricture    CHRONIC   Urinary incontinence    UTI (urinary tract infection)    frequent   Wears glasses     Past Surgical History:  Procedure Laterality Date   ABDOMINAL SURGERY     bilroths tube '74   APPENDECTOMY     COLONOSCOPY N/A 09/29/2014   Procedure: COLONOSCOPY;  Surgeon: Theda Belfast, MD;  Location: [facility];  Service: Endoscopy;  Laterality: N/A;   CYSTOSCOPY W/ URETERAL STENT PLACEMENT Left 05/06/2014   Procedure: CYSTOSCOPY WITH RETROGRADE PYELOGRAM/URETERAL STENT PLACEMENT;  Surgeon: Sebastian Ache, MD;  Location: [facility];  Service: Urology;  Laterality: Left;   CYSTOSCOPY W/ URETERAL STENT PLACEMENT Left 03/31/2015   Procedure: CYSTOSCOPY WITH LEFT RETROGRADE PYELOGRAM/URETERAL STENT EXCHANGE;  Surgeon: Sebastian Ache, MD;  Location: [facility];  Service: Urology;  Laterality: Left;   CYSTOSCOPY W/ URETERAL STENT PLACEMENT Left 09/15/2015   Procedure: CYSTOSCOPY WITH LEFT RETROGRADE PYELOGRAM/URETERAL STENT EXCHANGE ;  Surgeon: Sebastian Ache,  MD;  Location: [facility];  Service: Urology;  Laterality: Left;   CYSTOSCOPY W/ URETERAL STENT PLACEMENT Left 04/28/2016   Procedure: CYSTOSCOPY WITH RETROGRADE PYELOGRAM/URETERAL STENT EXCHANGE;  Surgeon: Sebastian Ache, MD;  Location: [facility];  Service: Urology;  Laterality: Left;   CYSTOSCOPY W/ URETERAL STENT PLACEMENT Left 11/08/2016   Procedure: CYSTOSCOPY WITH RETROGRADE PYELOGRAM/URETERAL STENT EXCHANGE;  Surgeon: Sebastian Ache, MD;  [facility]: [facility];  Service: Urology;  Laterality: Left;   CYSTOSCOPY W/ URETERAL STENT PLACEMENT Left 09/14/2017   Procedure: CYSTOSCOPY WITH RETROGRADE PYELOGRAM/URETERAL STENT PLACEMENT;  Surgeon: Sebastian Ache, MD;  [facility];  Service: Urology;  Laterality: Left;    CYSTOSCOPY W/ URETERAL STENT PLACEMENT Left 04/10/2018   Procedure: CYSTOSCOPY WITH LEFT RETROGRADE URETERAL STENT EXCHANGE;  Surgeon: Sebastian Ache, MD;  Location: [facility];  Service: Urology;  Laterality: Left;   CYSTOSCOPY W/ URETERAL STENT PLACEMENT Left 01/31/2019   Procedure: CYSTOSCOPY WITH RETROGRADE PYELOGRAM/URETERAL STENT PLACEMENT;  Surgeon: Sebastian Ache, MD;  [facility];  Service: Urology;  Laterality: Left;  45 MINS   CYSTOSCOPY W/ URETERAL STENT PLACEMENT Left 01/16/2020   Procedure: CYSTOSCOPY WITH RETROGRADE PYELOGRAM/URETERAL STENT EXCHANGED;  Surgeon: Sebastian Ache, MD;  Location: [facility];  Service: Urology;  Laterality: Left;   CYSTOSCOPY W/ URETERAL STENT PLACEMENT Left 04/29/2021   Procedure: CYSTOSCOPY WITH RETROGRADE PYELOGRAM/URETERAL STENT EXCHANGE;  Surgeon: Sebastian Ache, MD;  [facility];  Service: Urology;  Laterality: Left;   CYSTOSCOPY W/ URETERAL STENT PLACEMENT Left 09/06/2022   Procedure: CYSTOSCOPY WITH RETROGRADE PYELOGRAM/URETERAL STENT EXCHANGE WITH LITHOLAPAXY bladder;  Surgeon: Sebastian Ache, MD;  [facility];  Service: Urology;  Laterality: Left;  1 HR   CYSTOSCOPY WITH RETROGRADE PYELOGRAM, URETEROSCOPY AND STENT PLACEMENT Left 10/21/2014   Procedure: CYSTOSCOPY WITH RETROGRADE PYELOGRAM, URETEROSCOPY,BIOPSY AND STENT CHANGE;  Surgeon: Sebastian Ache, MD;  [facility];  Service: Urology;  Laterality: Left;   CYSTOSCOPY/RETROGRADE/URETEROSCOPY Left 06/03/2014   Procedure: CYSTOSCOPY/RETROGRADE/URETEROSCOPY WITH BIOPSY,STENT EXCHANGE;  Surgeon: Sebastian Ache, MD;  [facility];  Service: Urology;  Laterality: Left;   ESOPHAGOGASTRODUODENOSCOPY N/A 09/28/2014   Procedure: ESOPHAGOGASTRODUODENOSCOPY (EGD);  Surgeon: Theda Belfast, MD;  Location: [facility];  Service: Endoscopy;  Laterality: N/A;   GASTRIC ROUX-EN-Y     '74   HERNIA REPAIR     bilateral hernia repairs    IR ANGIO  INTRA EXTRACRAN SEL COM CAROTID INNOMINATE BILAT MOD SED  01/20/2021   IR ANGIO VERTEBRAL SEL SUBCLAVIAN INNOMINATE BILAT MOD SED  01/20/2021   IR US GUIDE VASC ACCESS RIGHT  01/20/2021   LAPAROSCOPIC APPENDECTOMY N/A 02/15/2020   Procedure: APPENDECTOMY LAPAROSCOPIC;  Surgeon: Griselda Miner, MD;  Location[facility];  Service: General;  Laterality: N/A;   TONSILLECTOMY  as child   TOTAL HIP ARTHROPLASTY Left 05/19/2016   Procedure: LEFT TOTAL HIP ARTHROPLASTY ANTERIOR APPROACH;  Surgeon: Kathryne Hitch, MD;  Location: [facility];  Service: Orthopedics;  Laterality: Left;   TRANSURETHRAL RESECTION OF PROSTATE     TRANSURETHRAL RESECTION OF PROSTATE N/A 09/06/2022   Procedure: REPEAT TRANSURETHRAL RESECTION OF THE PROSTATE (TURP);  Surgeon: Sebastian Ache, MD;  Location: [facility];  Service: Urology;  Laterality: N/A;   VAGOTOMY  1950's    Social History   Socioeconomic History   Marital status: Married    Spouse name: Not on file   Number of children: 6   Years of education: Not on file   Highest education level: Not on file  Occupational History  Not on file  Tobacco Use   Smoking status: Former    Types: Cigarettes   Smokeless tobacco: Former    Types: Chew   Tobacco comments:    quit yrs ago  Vaping Use   Vaping Use: Never used  Substance and Sexual Activity   Alcohol use: Not Currently    Comment: 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  Significant  diagnoses are listed below: Family History  Problem Relation Age of Onset   Colon cancer Mother 71 - 58   Breast cancer Mother 48 - 30   Prostate cancer Father 31   Thyroid cancer Brother 76       medullary   Multiple myeloma Maternal Aunt 40 - 79   Breast cancer Maternal Aunt 37 - 49   Colon cancer Maternal Aunt    Pancreatic cancer Maternal Uncle 101   Breast cancer Daughter 44   Breast cancer Cousin 69 - 82       maternal first cousin   Breast cancer Cousin 79 - 8       maternal first cousin   Thyroid cancer Cousin 80 - 49   Esophageal cancer Cousin 63 - 45       maternal first cousin   Heart disease Neg Hx      Mr. Free daughter was diagnosed with breast cancer at age 30 and reportedly had negative BRCA1/2 testing. His identical twin brother was diagnosed with medullary thyroid cancer at age 26, he died one week later. Mr. Kulhanek mother was diagnosed with breast cancer in her 5s and colon cancer in her late 60s, she died at age 44. His maternal uncle was diagnosed with pancreatic cancer at age 44, he died at 33. He had 3 maternal aunts, 1 was diagnosed with multiple myeloma in her 63s, 1 was diagnosed with breast cancer in her 59s and colon cancer at an unknown age, they are deceased. He has 2 maternal first cousins with a history of breast cancer. He has Ashkenazi Jewish ancestry.   GENETIC COUNSELING ASSESSMENT: Mr. Ozment is a 89 y.o. male with a personal and family history of cancer which is somewhat suggestive of a hereditary predisposition to cancer given his metastatic prostate cancer. We, therefore, discussed and recommended the following at today's visit.   DISCUSSION: We discussed that 5 - 10% of cancer is hereditary, with most cases of prostate cancer associated with BRCA1/2.  There are other genes that can be associated with hereditary prostate cancer syndromes.  We discussed that testing is beneficial for several reasons including knowing how to follow individuals  after completing their treatment, identifying whether potential treatment options would be beneficial, and understanding if other family members could be at risk for cancer and allowing them to undergo genetic testing.   We reviewed the characteristics, features and inheritance patterns of hereditary cancer syndromes. We also discussed genetic testing,  including the appropriate family members to test, the process of testing, insurance coverage and turn-around-time for results. We discussed the implications of a negative, positive, carrier and/or variant of uncertain significant result.   Mr. Housman elected to have [facility] Custom Panel. The Custom Hereditary Cancers Panel offered by [facility] includes sequencing and/or deletion duplication testing of the following 43 genes: APC, ATM, AXIN2, BAP1, BARD1, BMPR1A, BRCA1, BRCA2, BRIP1, CDH1, CDK4, CDKN2A (p14ARF and p16INK4a only), CHEK2, CTNNA1, EPCAM (Deletion/duplication testing only), FH, GREM1 (promoter region duplication testing only), HOXB13, KIT, MBD4, MEN1, MLH1, MSH2, MSH3, MSH6, MUTYH, NF1, NHTL1, PALB2, PDGFRA, PMS2, POLD1, POLE, PTEN, RAD51C, RAD51D, SMAD4, SMARCA4. STK11, TP53, TSC1, TSC2, and VHL.  Based on Mr. Klimas personal and family history of cancer, he meets medical criteria for genetic testing. Despite that he meets criteria, he may still have an out of pocket cost. We discussed that if his out of pocket cost for testing is over $100, the laboratory will call and confirm whether he wants to proceed with testing.  If the out of pocket cost of testing is less than $100 he will be billed by the genetic testing laboratory.   PLAN: After considering the risks, benefits, and limitations, Mr. Iten provided informed consent to pursue genetic testing and the blood sample was sent to [facility] for analysis of the Custom Panel. Results should be available within approximately 2-3 weeks' time, at which point they will be disclosed by  telephone to Mr. Gayler, as will any additional recommendations warranted by these results. Mr. Eickhoff will receive a summary of his genetic counseling visit and a copy of his results once available. This information will also be available in Epic.   Mr. Vanzeeland questions were answered to his satisfaction today. Our contact information was provided should additional questions or concerns arise. Thank you for the referral and allowing [place] to share in the care of your patient.   Lalla Brothers, MS, [facility] Genetic Counselor [email or web address] (P) [phone]  The patient was seen for a total of 35 minutes in face-to-face genetic counseling.  The patient brought his wife. Drs. Pamelia Hoit and/or Mosetta Putt were available to discuss this case as needed.   _______________________________________________________________________ For Office Staff:  Number of people involved in session: 2 Was an Intern/ student involved with case: no

## 2023-01-26 NOTE — Progress Notes (Signed)
[facility]   Telephone:([phone] [phone]   Clinic Follow up Note   Patient Care Team: [name], MD as PCP - General (Internal Medicine) Cherlyn Cushing, RN as Oncology Nurse Navigator  Date of Service:  01/26/2023  CHIEF COMPLAINT: f/u of Prostate Cancer  CURRENT THERAPY:  ADT under his urologist Dr. Kathrynn Running   ASSESSMENT:  Steven Cain is a 89 y.o. 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  He had a brief response initially, then his PSA has been steadily increasing, with recent PSA 318 on November 23, 2022.  His testosterone level has been undetectable. -He has clearly developed castration resistance. -I personally reviewed his PET scan images from February 11, 2023, which showed intensive hypermetabolic diffuse bone lesions, worse than last bone scan.  No evidence of visceral metastasis. -I discussed the nature history of metastatic prostate cancer, and his overall poor prognosis by his overall poor response to ADT -I discussed other systemic treatment options.  He has previously received Xofigo injections which did not help.  Due to his advanced age and medical comorbidities, I do not think he is a candidate for chemotherapy such as docetaxel.  I recommend him to consider Pluvicto therapy, I discussed the benefit and potential side effects with him.  He is interested, I have referred him to nuclear medicine Dr. Peterson Lombard. The main concern for Pluvicto is his CKD. -He will continue ADT under his urologist Dr. Kathrynn Running, okay to stop Zytiga and prednisone since his disease has progressed. -I also reviewed his [facility] report, which showed PTEN loss, and TMPRSS2 TMPRSS2-ERG fusion.  Microsatellite status in the tumor mutation burden was not able to be determined, unfortunately he has no targeted therapy  options based on the results. -I recommended germline genetic mutation, which he had today. -He has poor performance status, I recommend palliative home care service, patient and his wife agree.  We also reviewed hospice home care down the road when he progressed further and more deconditioned.     PLAN: -lab reviewed -I will check with Dr. Amil Amen to see if he will offer Pluvicto therapy -[facility] Palliative care and Hospice if no cancer treatment is available -Follow-up to be determined, we will see him back if he will receive Pluvicto therapy   INTERVAL HISTORY:  Steven CRISPIN is here for a follow up of Prostate Cancer He was last seen by me  01/15/2023. He presents to the clinic accompanied by wife. Pt wife state that he was feeling faint.    All other systems were reviewed with the patient and are negative.  MEDICAL HISTORY:  Past Medical History:  Diagnosis Date   Abnormal EKG    hx of left anterior fasicular block on ekg, no cardiologist   Anxiety    AR (aortic regurgitation) 03/23/2017   Mild, Noted on ECHO   Arthritis    DJD   Benign enlargement of prostate    frequent UTI, trim of prostate done , with some issues of incontinence now.   Cancer (HCC)    Chronic bronchitis (HCC)    chronic- usually has phelgm often   COVID-19 08/2019   fatigue and loss of taste and smell x 10 days did monoclonal antibody therapy   Depression    Fluid retention in legs    resolved   GERD (gastroesophageal  reflux disease)    rare   GI bleed    Grade I diastolic dysfunction    Hard of hearing    hear more on left side- no hearing aid   History of colon polyps    History of transfusion 2016   Hydronephrosis 2018   Left ckd stage 3 no nephrologist per wife adele   Hypertension    Moderate concentric left ventricular hypertrophy 03/23/2017   Noted on ECHO   Nausea & vomiting 06/08/2018   Perforated, severe stomach ulcer (HCC) 1974   Peripheral neuropathy    feet and toes    Pneumonia    Prostate cancer metastatic to bone [facility])    Spinal stenosis    Stroke (HCC)    cerebellalr stroke   TR (tricuspid regurgitation) 03/23/2017   Mild, Noted on ECHO   Transfusion history    none recent   Ureteral stricture    CHRONIC   Urinary incontinence    UTI (urinary tract infection)    frequent   Wears glasses     SURGICAL HISTORY: Past Surgical History:  Procedure Laterality Date   ABDOMINAL SURGERY     bilroths tube '74   APPENDECTOMY     COLONOSCOPY N/A 09/29/2014   Procedure: COLONOSCOPY;  Surgeon: Theda Belfast, MD;  Location: [facility];  Service: Endoscopy;  Laterality: N/A;   CYSTOSCOPY W/ URETERAL STENT PLACEMENT Left 05/06/2014   Procedure: CYSTOSCOPY WITH RETROGRADE PYELOGRAM/URETERAL STENT PLACEMENT;  Surgeon: Sebastian Ache, MD;  Location: [facility];  Service: Urology;  Laterality: Left;   CYSTOSCOPY W/ URETERAL STENT PLACEMENT Left 03/31/2015   Procedure: CYSTOSCOPY WITH LEFT RETROGRADE PYELOGRAM/URETERAL STENT EXCHANGE;  Surgeon: Sebastian Ache, MD;  Location: [facility];  Service: Urology;  Laterality: Left;   CYSTOSCOPY W/ URETERAL STENT PLACEMENT Left 09/15/2015   Procedure: CYSTOSCOPY WITH LEFT RETROGRADE PYELOGRAM/URETERAL STENT EXCHANGE ;  Surgeon: Sebastian Ache, MD;  Location: [facility];  Service: Urology;  Laterality: Left;   CYSTOSCOPY W/ URETERAL STENT PLACEMENT Left 04/28/2016   Procedure: CYSTOSCOPY WITH RETROGRADE PYELOGRAM/URETERAL STENT EXCHANGE;  Surgeon: Sebastian Ache, MD;  Location: [facility];  Service: Urology;  Laterality: Left;   CYSTOSCOPY W/ URETERAL STENT PLACEMENT Left 11/08/2016   Procedure: CYSTOSCOPY WITH RETROGRADE PYELOGRAM/URETERAL STENT EXCHANGE;  Surgeon: Sebastian Ache, MD;  Location: [facility];  Service: Urology;  Laterality: Left;   CYSTOSCOPY W/ URETERAL STENT PLACEMENT Left 09/14/2017   Procedure: CYSTOSCOPY WITH RETROGRADE PYELOGRAM/URETERAL STENT PLACEMENT;  Surgeon: Sebastian Ache, MD;  Location: [facility];  Service:  Urology;  Laterality: Left;   CYSTOSCOPY W/ URETERAL STENT PLACEMENT Left 04/10/2018   Procedure: CYSTOSCOPY WITH LEFT RETROGRADE URETERAL STENT EXCHANGE;  Surgeon: Sebastian Ache, MD;  Location[facility];  Service: Urology;  Laterality: Left;   CYSTOSCOPY W/ URETERAL STENT PLACEMENT Left 01/31/2019   Procedure: CYSTOSCOPY WITH RETROGRADE PYELOGRAM/URETERAL STENT PLACEMENT;  Surgeon: Sebastian Ache, MD;  Location: [facility];  Service: Urology;  Laterality: Left;  45 MINS   CYSTOSCOPY W/ URETERAL STENT PLACEMENT Left 01/16/2020   Procedure: CYSTOSCOPY WITH RETROGRADE PYELOGRAM/URETERAL STENT EXCHANGED;  Surgeon: Sebastian Ache, MD;  Location: [facility];  Service: Urology;  Laterality: Left;   CYSTOSCOPY W/ URETERAL STENT PLACEMENT Left 04/29/2021   Procedure: CYSTOSCOPY WITH RETROGRADE PYELOGRAM/URETERAL STENT EXCHANGE;  Surgeon: Sebastian Ache, MD;  Location: [facility];  Service: Urology;  Laterality: Left;   CYSTOSCOPY W/ URETERAL STENT PLACEMENT Left 09/06/2022   Procedure: CYSTOSCOPY WITH RETROGRADE PYELOGRAM/URETERAL STENT EXCHANGE WITH LITHOLAPAXY bladder;  Surgeon: Sebastian Ache,  MD;  Location: [facility];  Service: Urology;  Laterality: Left;  1 HR   CYSTOSCOPY WITH RETROGRADE PYELOGRAM, URETEROSCOPY AND STENT PLACEMENT Left 10/21/2014   Procedure: CYSTOSCOPY WITH RETROGRADE PYELOGRAM, URETEROSCOPY,BIOPSY AND STENT CHANGE;  Surgeon: Sebastian Ache, MD;  Location: [facility];  Service: Urology;  Laterality: Left;   CYSTOSCOPY/RETROGRADE/URETEROSCOPY Left 06/03/2014   Procedure: CYSTOSCOPY/RETROGRADE/URETEROSCOPY WITH BIOPSY,STENT EXCHANGE;  Surgeon: Sebastian Ache, MD;  Location: [facility];  Service: Urology;  Laterality: Left;   ESOPHAGOGASTRODUODENOSCOPY N/A 09/28/2014   Procedure: ESOPHAGOGASTRODUODENOSCOPY (EGD);  Surgeon: Theda Belfast, MD;  Location: [facility];  Service: Endoscopy;  Laterality: N/A;   GASTRIC ROUX-EN-Y     '74   HERNIA REPAIR     bilateral  hernia repairs    IR ANGIO INTRA EXTRACRAN SEL COM CAROTID INNOMINATE BILAT MOD SED  01/20/2021   IR ANGIO VERTEBRAL SEL SUBCLAVIAN INNOMINATE BILAT MOD SED  01/20/2021   IR US GUIDE VASC ACCESS RIGHT  01/20/2021   LAPAROSCOPIC APPENDECTOMY N/A 02/15/2020   Procedure: APPENDECTOMY LAPAROSCOPIC;  Surgeon: Griselda Miner, MD;  Location: [facility];  Service: General;  Laterality: N/A;   TONSILLECTOMY  as child   TOTAL HIP ARTHROPLASTY Left 05/19/2016   Procedure: LEFT TOTAL HIP ARTHROPLASTY ANTERIOR APPROACH;  Surgeon: Kathryne Hitch, MD;  Location: [facility];  Service: Orthopedics;  Laterality: Left;   TRANSURETHRAL RESECTION OF PROSTATE     TRANSURETHRAL RESECTION OF PROSTATE N/A 09/06/2022   Procedure: REPEAT TRANSURETHRAL RESECTION OF THE PROSTATE (TURP);  Surgeon: Sebastian Ache, MD;  Location: [facility];  Service: Urology;  Laterality: N/A;   VAGOTOMY  1950's    I have reviewed the social history and family history with the patient and they are unchanged from previous note.  ALLERGIES:  is allergic to bee venom.  MEDICATIONS:  Current Outpatient Medications  Medication Sig Dispense Refill   abiraterone acetate (ZYTIGA) 250 MG tablet Take 1,000 mg by mouth daily.     acetaminophen (TYLENOL) 500 MG tablet Take 1 tablet (500 mg total) by mouth every 6 (six) hours as needed for mild pain. (Patient taking differently: Take 1,000 mg by mouth every 6 (six) hours as needed for mild pain.)     albuterol (VENTOLIN HFA) 108 (90 Base) MCG/ACT inhaler Inhale 2 puffs into the lungs every 6 (six) hours as needed for wheezing or shortness of breath.     Ascorbic Acid (VITAMIN C) 1000 MG tablet Take 1,000 mg by mouth daily.     aspirin EC 81 MG tablet Take 81 mg by mouth daily.     Camphor-Eucalyptus-Menthol (VICKS VAPORUB EX) Apply 1 Application topically daily as needed (congestion).     cyanocobalamin (,VITAMIN B-12,) 1000 MCG/ML injection Inject 1,000 mcg into the muscle every 30 (thirty) days.      Denosumab (XGEVA [place]) Inject 1 Dose into the skin every 28 (twenty-eight) days.     diclofenac Sodium (VOLTAREN) 1 % GEL Apply 1 Application topically daily as needed (pain).     hydroxypropyl methylcellulose / hypromellose (ISOPTO TEARS / GONIOVISC) 2.5 % ophthalmic solution Place 1 drop into both eyes daily as needed for dry eyes.     leuprolide, 6 Month, (ELIGARD) 45 MG injection Inject 45 mg into the skin every 6 (six) months.     Magnesium 250 MG TABS Take 250 mg by mouth daily.     meclizine (ANTIVERT) 25 MG tablet Take 25 mg by mouth See admin instructions. Take 25 mg daily, may take a second 25 mg dose as  needed for dizziness     melatonin 3 MG TABS tablet Take 3 mg by mouth at bedtime.     Misc Natural Products (NEURIVA PO) Take 1 capsule by mouth daily.     Multiple Vitamin (MULTIVITAMIN WITH MINERALS) TABS tablet Take 1 tablet by mouth every morning.     Omega-3 Fatty Acids (FISH OIL) 1200 MG CAPS Take 1,200 mg by mouth daily.     OVER THE COUNTER MEDICATION Take 2 capsules by mouth daily. Dynamic brain otc supplement     oxybutynin (DITROPAN) 5 MG tablet Take 5 mg by mouth 2 (two) times daily.     oxyCODONE (ROXICODONE) 5 MG immediate release tablet Take 1 tablet (5 mg total) by mouth every 8 (eight) hours as needed. For post-op pain and cancer pain 20 tablet 0   PARoxetine (PAXIL) 40 MG tablet Take 1 tablet (40 mg total) by mouth daily. (Patient taking differently: Take 40 mg by mouth at bedtime.) 30 tablet 11   predniSONE (DELTASONE) 5 MG tablet Take 5 mg by mouth daily.     senna-docusate (SENOKOT-S) 8.6-50 MG tablet Take 1 tablet by mouth at bedtime. (Patient taking differently: Take 2 tablets by mouth 2 (two) times daily.) 30 tablet 0   traZODone (DESYREL) 50 MG tablet Take 50 mg by mouth at bedtime.     vitamin E 180 MG (400 UNITS) capsule Take 400 Units by mouth every Wednesday.     Zinc 50 MG TABS Take 50 mg by mouth daily.     No current facility-administered medications  for this visit.    PHYSICAL EXAMINATION: ECOG PERFORMANCE STATUS: 3 - Symptomatic, >50% confined to bed  Vitals:   01/26/23 1426  BP: (!) 183/103  Pulse: 86  Resp: 18  Temp: 98.3 F (36.8 C)  SpO2: 97%   Wt Readings from Last 3 Encounters:  01/26/23 158 lb 6.4 oz (71.8 kg)  12/22/22 158 lb 9.6 oz (71.9 kg)  09/06/22 180 lb (81.6 kg)     GENERAL:alert, no distress and comfortable SKIN: skin color, texture, turgor are normal, no rashes or significant lesions EYES: normal, Conjunctiva are pink and non-injected, sclera clear NECK: supple, thyroid normal size, non-tender, without nodularity LYMPH:  no palpable lymphadenopathy in the cervical, axillary  LUNGS: clear to auscultation and percussion with normal breathing effort HEART: regular rate & rhythm and no murmurs and no lower extremity edema ABDOMEN:abdomen soft, non-tender and normal bowel sounds Musculoskeletal:no cyanosis of digits and no clubbing  NEURO: alert & oriented x 3 with fluent speech, no focal motor/sensory deficits  LABORATORY DATA:  I have reviewed the data as listed    Latest Ref Rng & Units 01/26/2023    1:36 PM 01/12/2023   12:24 PM 09/04/2022    3:09 PM  CBC  WBC 4.0 - 10.5 K/uL 9.9  7.8  9.2   Hemoglobin 13.0 - 17.0 g/dL 09.8  11.9  14.7   Hematocrit 39.0 - 52.0 % 40.3  39.6  42.5   Platelets 150 - 400 K/uL 229  252  228         Latest Ref Rng & Units 01/26/2023    1:36 PM 01/12/2023   12:24 PM 09/04/2022    3:09 PM  CMP  Glucose 70 - 99 mg/dL 829  562  130   BUN 8 - 23 mg/dL 23  17  20    Creatinine 0.61 - 1.24 mg/dL 8.65  7.84  6.96   Sodium 135 - 145 mmol/L 134  136  137   Potassium 3.5 - 5.1 mmol/L 4.2  4.8  3.9   Chloride 98 - 111 mmol/L 98  98  101   CO2 22 - 32 mmol/L 29  31  27    Calcium 8.9 - 10.3 mg/dL 9.4  9.2  9.0   Total Protein 6.5 - 8.1 g/dL 6.8  7.2    Total Bilirubin 0.3 - 1.2 mg/dL 0.4  0.4    Alkaline Phos 38 - 126 U/L 145  153    AST 15 - 41 U/L 14  15    ALT 0 - 44 U/L  5  6        RADIOGRAPHIC STUDIES: I have personally reviewed the radiological images as listed and agreed with the findings in the report. No results found.    No orders of the defined types were placed in this encounter.  All questions were answered. The patient knows to call the clinic with any problems, questions or concerns. No barriers to learning was detected. The total time spent in the appointment was 25 minutes.     Malachy Mood, MD 01/26/2023   Carolin Coy, CMA, am acting as scribe for Malachy Mood, MD.   I have reviewed the above documentation for accuracy and completeness, and I agree with the above.

## 2023-01-28 LAB — PROSTATE-SPECIFIC AG, SERUM (LABCORP): Prostate Specific Ag, Serum: 462 ng/mL — ABNORMAL HIGH (ref 0.0–4.0)

## 2023-01-31 DIAGNOSIS — C61 Malignant neoplasm of prostate: Secondary | ICD-10-CM | POA: Diagnosis not present

## 2023-01-31 DIAGNOSIS — C7951 Secondary malignant neoplasm of bone: Secondary | ICD-10-CM | POA: Diagnosis not present

## 2023-02-06 ENCOUNTER — Other Ambulatory Visit (HOSPITAL_COMMUNITY): Payer: Self-pay | Admitting: Diagnostic Radiology

## 2023-02-06 DIAGNOSIS — C61 Malignant neoplasm of prostate: Secondary | ICD-10-CM

## 2023-02-08 ENCOUNTER — Encounter: Payer: Medicare Other | Admitting: Genetic Counselor

## 2023-02-08 ENCOUNTER — Other Ambulatory Visit: Payer: Medicare Other

## 2023-02-09 ENCOUNTER — Telehealth: Payer: Self-pay | Admitting: Genetic Counselor

## 2023-02-09 ENCOUNTER — Encounter: Payer: Self-pay | Admitting: Genetic Counselor

## 2023-02-09 DIAGNOSIS — Z1379 Encounter for other screening for genetic and chromosomal anomalies: Secondary | ICD-10-CM | POA: Insufficient documentation

## 2023-02-09 NOTE — Telephone Encounter (Addendum)
I attempted to contact Mr. Earlywine to discuss his genetic testing results (43 genes). This is our fourth attempt to disclose results. Therefore, we left a voicemail stating we will release the results to his email and a detailed clinic note will be written that he can view in MyChart. No pathogenic variants were identified in the 43 genes analyzed.   Lalla Brothers, MS, [facility] Genetic Counselor [email or web address] ([phone]) [phone]

## 2023-02-14 ENCOUNTER — Other Ambulatory Visit (HOSPITAL_COMMUNITY): Payer: Self-pay | Admitting: Hematology

## 2023-02-14 ENCOUNTER — Other Ambulatory Visit: Payer: Self-pay | Admitting: Hematology

## 2023-02-14 DIAGNOSIS — C61 Malignant neoplasm of prostate: Secondary | ICD-10-CM

## 2023-02-14 DIAGNOSIS — C7951 Secondary malignant neoplasm of bone: Secondary | ICD-10-CM

## 2023-02-21 ENCOUNTER — Encounter (HOSPITAL_COMMUNITY): Payer: Medicare Other

## 2023-02-21 ENCOUNTER — Other Ambulatory Visit (HOSPITAL_COMMUNITY): Payer: Medicare Other

## 2023-02-21 ENCOUNTER — Ambulatory Visit (HOSPITAL_COMMUNITY): Payer: Medicare Other

## 2023-02-23 NOTE — Progress Notes (Signed)
I called patient for scheduled phone visit, now with the patient or his wife answered his phone call.  Steven Cain  01/15/2023

## 2023-02-28 DIAGNOSIS — C7951 Secondary malignant neoplasm of bone: Secondary | ICD-10-CM | POA: Diagnosis not present

## 2023-02-28 DIAGNOSIS — C61 Malignant neoplasm of prostate: Secondary | ICD-10-CM | POA: Diagnosis not present

## 2023-03-06 ENCOUNTER — Other Ambulatory Visit: Payer: Self-pay | Admitting: Hematology

## 2023-03-06 ENCOUNTER — Ambulatory Visit (HOSPITAL_COMMUNITY)
Admission: RE | Admit: 2023-03-06 | Discharge: 2023-03-06 | Disposition: A | Discharge status: Home / Self Care | Payer: Medicare Other | Source: Ambulatory Visit | Attending: Hematology | Admitting: Hematology

## 2023-03-06 DIAGNOSIS — N19 Unspecified kidney failure: Secondary | ICD-10-CM | POA: Diagnosis not present

## 2023-03-06 DIAGNOSIS — C7951 Secondary malignant neoplasm of bone: Secondary | ICD-10-CM | POA: Insufficient documentation

## 2023-03-06 DIAGNOSIS — R972 Elevated prostate specific antigen [PSA]: Secondary | ICD-10-CM | POA: Diagnosis not present

## 2023-03-06 DIAGNOSIS — C61 Malignant neoplasm of prostate: Secondary | ICD-10-CM

## 2023-03-06 DIAGNOSIS — K117 Disturbances of salivary secretion: Secondary | ICD-10-CM | POA: Diagnosis not present

## 2023-03-06 NOTE — Consult Note (Signed)
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     PSA elevated from 314 on November 23, 2022 2462 currently     PSMA PET scan from 01/12/2023 demonstrated widespread skeletal metastasis avid for PSMA radiotracer.     LEFT renal stent noted.    Past Medical History:  Diagnosis Date   Abnormal EKG    hx of left anterior fasicular block on ekg, no cardiologist   Anxiety    AR (aortic regurgitation) 03/23/2017   Mild, Noted on ECHO   Arthritis    DJD   Benign enlargement of prostate    frequent UTI, trim of prostate done , with some issues of incontinence now.   Cancer (HCC)    Chronic bronchitis (HCC)    chronic- usually has phelgm often   COVID-19 08/2019   fatigue and loss of taste and smell x 10 days did monoclonal antibody therapy   Depression    Fluid retention in legs    resolved   GERD (gastroesophageal reflux disease)    rare   GI bleed    Grade I diastolic dysfunction    Hard of hearing    hear more on left side- no hearing aid   History of colon polyps    History of transfusion 2016   Hydronephrosis 2018   Left ckd stage 3 no nephrologist per wife adele   Hypertension    Moderate concentric left ventricular hypertrophy 03/23/2017   Noted on ECHO   Nausea & vomiting 06/08/2018   Perforated, severe stomach ulcer (HCC) 1974   Peripheral neuropathy    feet and toes   Pneumonia    Prostate cancer metastatic to bone [facility])    Spinal stenosis    Stroke (HCC)    cerebellalr stroke   TR (tricuspid regurgitation) 03/23/2017   Mild, Noted on ECHO   Transfusion history    none recent   Ureteral stricture    CHRONIC   Urinary incontinence     UTI (urinary tract infection)    frequent   Wears glasses     Past Surgical History:  Procedure Laterality Date   ABDOMINAL SURGERY     bilroths tube '74   APPENDECTOMY     COLONOSCOPY N/A 09/29/2014   Procedure: COLONOSCOPY;  Surgeon: Theda Belfast, MD;  Location: [facility];  Service: Endoscopy;  Laterality: N/A;   CYSTOSCOPY W/ URETERAL STENT PLACEMENT Left 05/06/2014   Procedure: CYSTOSCOPY WITH RETROGRADE PYELOGRAM/URETERAL STENT PLACEMENT;  Surgeon: Sebastian Ache, MD;  Location: [facility];  Service: Urology;  Laterality: Left;   CYSTOSCOPY W/ URETERAL STENT PLACEMENT Left 03/31/2015   Procedure: CYSTOSCOPY WITH LEFT RETROGRADE PYELOGRAM/URETERAL STENT EXCHANGE;  Surgeon: Sebastian Ache, MD;  Location: [facility];  Service: Urology;  Laterality: Left;   CYSTOSCOPY W/ URETERAL STENT PLACEMENT Left 09/15/2015   Procedure: CYSTOSCOPY WITH LEFT RETROGRADE PYELOGRAM/URETERAL STENT EXCHANGE ;  Surgeon: Sebastian Ache, MD;  Location: [facility];  Service: Urology;  Laterality: Left;   CYSTOSCOPY W/ URETERAL STENT PLACEMENT Left 04/28/2016   Procedure: CYSTOSCOPY WITH RETROGRADE PYELOGRAM/URETERAL STENT EXCHANGE;  Surgeon: Sebastian Ache, MD;  Location: [facility];  Service: Urology;  Laterality: Left;   CYSTOSCOPY W/ URETERAL STENT PLACEMENT Left 11/08/2016   Procedure:  CYSTOSCOPY WITH RETROGRADE PYELOGRAM/URETERAL STENT EXCHANGE;  Surgeon: Sebastian Ache, MD;  Location: [facility];  Service: Urology;  Laterality: Left;   CYSTOSCOPY W/ URETERAL STENT PLACEMENT Left 09/14/2017   Procedure: CYSTOSCOPY WITH RETROGRADE PYELOGRAM/URETERAL STENT PLACEMENT;  Surgeon: Sebastian Ache, MD;  [facility];  Service: Urology;  Laterality: Left;   CYSTOSCOPY W/ URETERAL STENT PLACEMENT Left 04/10/2018   Procedure: CYSTOSCOPY WITH LEFT RETROGRADE URETERAL STENT EXCHANGE;  Surgeon: Sebastian Ache, MD;  Location: [facility];  Service: Urology;  Laterality: Left;   CYSTOSCOPY W/ URETERAL STENT PLACEMENT  Left 01/31/2019   Procedure: CYSTOSCOPY WITH RETROGRADE PYELOGRAM/URETERAL STENT PLACEMENT;  Surgeon: Sebastian Ache, MD;  [facility];  Service: Urology;  Laterality: Left;  45 MINS   CYSTOSCOPY W/ URETERAL STENT PLACEMENT Left 01/16/2020   Procedure: CYSTOSCOPY WITH RETROGRADE PYELOGRAM/URETERAL STENT EXCHANGED;  Surgeon: Sebastian Ache, MD;  Location: [facility];  Service: Urology;  Laterality: Left;   CYSTOSCOPY W/ URETERAL STENT PLACEMENT Left 04/29/2021   Procedure: CYSTOSCOPY WITH RETROGRADE PYELOGRAM/URETERAL STENT EXCHANGE;  Surgeon: Sebastian Ache, MD;  [facility];  Service: Urology;  Laterality: Left;   CYSTOSCOPY W/ URETERAL STENT PLACEMENT Left 09/06/2022   Procedure: CYSTOSCOPY WITH RETROGRADE PYELOGRAM/URETERAL STENT EXCHANGE WITH LITHOLAPAXY bladder;  Surgeon: Sebastian Ache, MD;  [facility]: [facility];  Service: Urology;  Laterality: Left;  1 HR   CYSTOSCOPY WITH RETROGRADE PYELOGRAM, URETEROSCOPY AND STENT PLACEMENT Left 10/21/2014   Procedure: CYSTOSCOPY WITH RETROGRADE PYELOGRAM, URETEROSCOPY,BIOPSY AND STENT CHANGE;  Surgeon: Sebastian Ache, MD;  [facility];  Service: Urology;  Laterality: Left;   CYSTOSCOPY/RETROGRADE/URETEROSCOPY Left 06/03/2014   Procedure: CYSTOSCOPY/RETROGRADE/URETEROSCOPY WITH BIOPSY,STENT EXCHANGE;  Surgeon: Sebastian Ache, MD;  [facility];  Service: Urology;  Laterality: Left;   ESOPHAGOGASTRODUODENOSCOPY N/A 09/28/2014   Procedure: ESOPHAGOGASTRODUODENOSCOPY (EGD);  Surgeon: Theda Belfast, MD;  Location: [facility];  Service: Endoscopy;  Laterality: N/A;   GASTRIC ROUX-EN-Y     '74   HERNIA REPAIR     bilateral hernia repairs    IR ANGIO INTRA EXTRACRAN SEL COM CAROTID INNOMINATE BILAT MOD SED  01/20/2021   IR ANGIO VERTEBRAL SEL SUBCLAVIAN INNOMINATE BILAT MOD SED  01/20/2021   IR US GUIDE VASC ACCESS RIGHT  01/20/2021   LAPAROSCOPIC APPENDECTOMY N/A 02/15/2020   Procedure: APPENDECTOMY LAPAROSCOPIC;   Surgeon: Griselda Miner, MD;  Location: [facility];  Service: General;  Laterality: N/A;   TONSILLECTOMY  as child   TOTAL HIP ARTHROPLASTY Left 05/19/2016   Procedure: LEFT TOTAL HIP ARTHROPLASTY ANTERIOR APPROACH;  Surgeon: Kathryne Hitch, MD;  Location: [facility];  Service: Orthopedics;  Laterality: Left;   TRANSURETHRAL RESECTION OF PROSTATE     TRANSURETHRAL RESECTION OF PROSTATE N/A 09/06/2022   Procedure: REPEAT TRANSURETHRAL RESECTION OF THE PROSTATE (TURP);  Surgeon: Sebastian Ache, MD;  Location: [facility];  Service: Urology;  Laterality: N/A;   VAGOTOMY  1950's    Allergies: Bee venom  Medications: Prior to Admission medications   Medication Sig Start Date End Date Taking? Authorizing Provider  abiraterone acetate (ZYTIGA) 250 MG tablet Take 1,000 mg by mouth daily. 01/02/22   [provider]  acetaminophen (TYLENOL) 500 MG tablet Take 1 tablet (500 mg total) by mouth every 6 (six) hours as needed for mild pain. Patient taking differently: Take 1,000 mg by mouth every 6 (six) hours as needed for mild pain. 02/19/20   Juliet Rude, PA-C  albuterol (VENTOLIN HFA) 108 (90 Base) MCG/ACT inhaler Inhale 2 puffs into the lungs  every 6 (six) hours as needed for wheezing or shortness of breath.    [provider]  Ascorbic Acid (VITAMIN C) 1000 MG tablet Take 1,000 mg by mouth daily.    [provider]  aspirin EC 81 MG tablet Take 81 mg by mouth daily.    [provider]  Camphor-Eucalyptus-Menthol (VICKS VAPORUB EX) Apply 1 Application topically daily as needed (congestion).    [provider]  cyanocobalamin (,VITAMIN B-12,) 1000 MCG/ML injection Inject 1,000 mcg into the muscle every 30 (thirty) days.    [provider]  Denosumab (XGEVA [place]) Inject 1 Dose into the skin every 28 (twenty-eight) days.    [provider]  diclofenac Sodium (VOLTAREN) 1 % GEL Apply 1 Application topically daily as needed (pain).    [provider]  hydroxypropyl methylcellulose / hypromellose (ISOPTO TEARS / GONIOVISC) 2.5 % ophthalmic solution Place 1 drop into both eyes daily as needed for dry eyes.    [provider]  leuprolide, 6 Month, (ELIGARD) 45 MG injection Inject 45 mg into the skin every 6 (six) months.    [provider]  Magnesium 250 MG TABS Take 250 mg by mouth daily.    [provider]  meclizine (ANTIVERT) 25 MG tablet Take 25 mg by mouth See admin instructions. Take 25 mg daily, may take a second 25 mg dose as needed for dizziness    [provider]  melatonin 3 MG TABS tablet Take 3 mg by mouth at bedtime.    [provider]  Misc Natural Products (NEURIVA PO) Take 1 capsule by mouth daily.    [provider]  Multiple Vitamin (MULTIVITAMIN WITH MINERALS) TABS tablet Take 1 tablet by mouth every morning.    [provider]  Omega-3 Fatty Acids (FISH OIL) 1200 MG CAPS Take 1,200 mg by mouth daily.    [provider]  OVER THE COUNTER MEDICATION Take 2 capsules by mouth daily. Dynamic brain otc supplement    [provider]  oxybutynin (DITROPAN) 5 MG tablet Take 5 mg by mouth 2 (two) times daily. 07/13/22   [provider]  oxyCODONE (ROXICODONE) 5 MG immediate release tablet Take 1 tablet (5 mg total) by mouth every 8 (eight) hours as needed. For post-op pain and cancer pain 09/06/22 09/06/23  Loletta Parish., MD  PARoxetine (PAXIL) 40 MG tablet Take 1 tablet (40 mg total) by mouth daily. Patient taking differently: Take 40 mg by mouth at bedtime. 06/13/18   Danford, Earl Lites, MD  predniSONE (DELTASONE) 5 MG tablet Take 5 mg by mouth daily. 01/02/22   [provider]  senna-docusate (SENOKOT-S) 8.6-50 MG tablet Take 1 tablet by mouth at bedtime. Patient taking differently: Take 2 tablets by mouth 2 (two) times daily. 06/13/18   Danford, Earl Lites, MD  traZODone (DESYREL) 50 MG tablet Take 50 mg by  mouth at bedtime. 01/19/20   [provider]  vitamin E 180 MG (400 UNITS) capsule Take 400 Units by mouth every Wednesday.    [provider]  Zinc 50 MG TABS Take 50 mg by mouth daily.    [provider]     Family History  Problem Relation Age of Onset   Colon cancer Mother 20 - 61   Breast cancer Mother 92 - 29   Prostate cancer Father 27   Thyroid cancer Brother 23       medullary   Multiple myeloma Maternal Aunt 70 - 79  Breast cancer Maternal Aunt 40 - 49   Colon cancer Maternal Aunt    Pancreatic cancer Maternal Uncle 23   Breast cancer Daughter 14   Breast cancer Cousin 86 - 65       maternal first cousin   Breast cancer Cousin 63 - 36       maternal first cousin   Thyroid cancer Cousin 8 - 49   Esophageal cancer Cousin 76 - 53       maternal first cousin   Heart disease Neg Hx     Social History   Socioeconomic History   Marital status: Married    Spouse name: Not on file   Number of children: 6   Years of education: Not on file   Highest education level: Not on file  Occupational History   Not on file  Tobacco Use   Smoking status: Former    Types: Cigarettes   Smokeless tobacco: Former    Types: Chew   Tobacco comments:    quit yrs ago  Vaping Use   Vaping status: Never Used  Substance and Sexual Activity   Alcohol use: Not Currently    Comment: 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  All other systems are negative.  Review of Systems  [No bone pain.  Incontinence.  Skin bruising.]  Vital Signs: There were no vitals taken for this visit.  Physical Exam  Imaging: No results found.  Labs:  CBC: Recent Labs    08/03/22 1203 09/04/22 1509 01/12/23 1224 01/26/23 1336  WBC 8.4 9.2 7.8 9.9  HGB 13.1 13.6 13.0 13.1  HCT 40.4 42.5 39.6 40.3  PLT 240 228 252 229    COAGS: No results for input(s): "INR", "APTT" in the last 8760 hours.  BMP: Recent Labs    09/04/22 1509 01/12/23 1224 01/26/23 1336  NA 137 136 134*  K 3.9 4.8 4.2  CL 101 98 98  CO2 27 31 29   GLUCOSE 135* 113* 108*  BUN 20 17 23   CALCIUM 9.0 9.2 9.4  CREATININE 1.59* 1.59* 1.27*  GFRNONAA 41* 41* 54*    LIVER FUNCTION TESTS: Recent Labs    01/12/23 1224 01/26/23 1336  BILITOT 0.4 0.4  AST 15 14*  ALT 6 5  ALKPHOS 153* 145*  PROT 7.2 6.8  ALBUMIN 3.8 3.6    TUMOR MARKERS: No results for input(s): "AFPTM", "CEA", "CA199", "CHROMOGA" in the last 8760 hours.  PSA = 462 Assessment and Plan:  Patient marginally adequate candidate for Lu-177 PSMA therapy.  Factors supporting therapy would be normal CBC, ECOG status and support system.  Detracting factors would be poor renal function, incontinence, and age.  Risk and benefits explained to patient and patient's wife and all questions answered.   Primary benefit is for progression  free survival.  Primary risk renal toxicity, mild suppression and xerostomia.   Patient will receive 6 treatment space 6 weeks apart.  Patient will return to oncologist 1 week prior to each therapy for laboratory assessment and clinical assessment.   ]   Thank you for this interesting consult.  I greatly enjoyed meeting Steven Cain and look forward to participating in their care.  A copy of this report was sent to the requesting provider on this date.  Electronically Signed: Patriciaann Clan, MD 03/06/2023, 3:00 PM   I spent a total of  30 Minutes   in face to face in clinical consultation, greater than 50% of which was counseling/coordinating care for metastatic neuroendocrine tumor.

## 2023-03-08 DIAGNOSIS — C61 Malignant neoplasm of prostate: Secondary | ICD-10-CM | POA: Diagnosis not present

## 2023-03-08 DIAGNOSIS — C7951 Secondary malignant neoplasm of bone: Secondary | ICD-10-CM | POA: Diagnosis not present

## 2023-03-08 DIAGNOSIS — N3941 Urge incontinence: Secondary | ICD-10-CM | POA: Diagnosis not present

## 2023-03-09 ENCOUNTER — Other Ambulatory Visit: Payer: Self-pay

## 2023-03-09 DIAGNOSIS — C61 Malignant neoplasm of prostate: Secondary | ICD-10-CM

## 2023-03-12 ENCOUNTER — Telehealth: Payer: Self-pay | Admitting: Hematology

## 2023-03-12 ENCOUNTER — Encounter: Payer: Self-pay | Admitting: Hematology

## 2023-03-12 ENCOUNTER — Other Ambulatory Visit: Payer: Self-pay

## 2023-03-25 NOTE — Assessment & Plan Note (Signed)
Diagnosed with metastatic prostate cancer to bones in October 2021 -He has been on ADT, Zytiga and prednisone since then.  He had a brief response initially, then his PSA has been steadily increasing, with recent PSA 318 on November 23, 2022.  His testosterone level has been undetectable. -He has clearly developed castration resistance. --I previously discussed other systemic treatment options.  He has previously received Xofigo injections which did not help.  Due to his advanced age and medical comorbidities, I do not think he is a candidate for chemotherapy such as docetaxel.   -He was referred to Dr. Peterson Lombard for evaluation Pluvicto, unfortunately he is not a candidate for Pluvicto due to his CKD and urinary incontinence  -He will continue ADT under his urologist Dr. Kathrynn Running, okay to stop Zytiga and prednisone since his disease has progressed. -I previously reviewed his [facility] report, which showed PTEN loss, and TMPRSS2 TMPRSS2-ERG fusion.  Microsatellite status in the tumor mutation burden was not able to be determined, unfortunately he has no targeted therapy options based on the results. -He was referred to nuclear [facility] radiologist Dr. Debarah Crape, unfortunately he was not felt to be a poor candidate for Pluvicto due to his CKD and urinary incontinence.  I explained to patient today, he voiced good understanding. -I recommend palliative home care, and transition to hospice when he develops more symptoms.  Patient and his daughter daughter all agreed.  Will refer [facility] to [facility] for palliative home care service  -f/u as needed

## 2023-03-26 ENCOUNTER — Ambulatory Visit: Payer: Self-pay | Admitting: Genetic Counselor

## 2023-03-26 ENCOUNTER — Encounter: Payer: Self-pay | Admitting: Genetic Counselor

## 2023-03-26 ENCOUNTER — Other Ambulatory Visit: Payer: Self-pay

## 2023-03-26 ENCOUNTER — Inpatient Hospital Stay: Payer: Medicare Other | Attending: Hematology

## 2023-03-26 ENCOUNTER — Encounter: Payer: Self-pay | Admitting: Hematology

## 2023-03-26 ENCOUNTER — Inpatient Hospital Stay: Payer: Medicare Other | Admitting: Hematology

## 2023-03-26 VITALS — BP 157/78 | HR 83 | Temp 97.5°F | Resp 18 | Ht 63.0 in | Wt 156.9 lb

## 2023-03-26 DIAGNOSIS — H9190 Unspecified hearing loss, unspecified ear: Secondary | ICD-10-CM | POA: Insufficient documentation

## 2023-03-26 DIAGNOSIS — C7951 Secondary malignant neoplasm of bone: Secondary | ICD-10-CM | POA: Insufficient documentation

## 2023-03-26 DIAGNOSIS — C61 Malignant neoplasm of prostate: Secondary | ICD-10-CM

## 2023-03-26 DIAGNOSIS — Z1379 Encounter for other screening for genetic and chromosomal anomalies: Secondary | ICD-10-CM

## 2023-03-26 DIAGNOSIS — Z79899 Other long term (current) drug therapy: Secondary | ICD-10-CM | POA: Diagnosis not present

## 2023-03-26 LAB — CBC WITH DIFFERENTIAL/PLATELET
Abs Immature Granulocytes: 0.04 10*3/uL (ref 0.00–0.07)
Basophils Absolute: 0.1 10*3/uL (ref 0.0–0.1)
Basophils Relative: 1 %
Eosinophils Absolute: 0.2 10*3/uL (ref 0.0–0.5)
Eosinophils Relative: 3 %
HCT: 38.4 % — ABNORMAL LOW (ref 39.0–52.0)
Hemoglobin: 13.2 g/dL (ref 13.0–17.0)
Immature Granulocytes: 1 %
Lymphocytes Relative: 28 %
Lymphs Abs: 2.4 10*3/uL (ref 0.7–4.0)
MCH: 30.7 pg (ref 26.0–34.0)
MCHC: 34.4 g/dL (ref 30.0–36.0)
MCV: 89.3 fL (ref 80.0–100.0)
Monocytes Absolute: 0.5 10*3/uL (ref 0.1–1.0)
Monocytes Relative: 6 %
Neutro Abs: 5.1 10*3/uL (ref 1.7–7.7)
Neutrophils Relative %: 61 %
Platelets: 249 10*3/uL (ref 150–400)
RBC: 4.3 MIL/uL (ref 4.22–5.81)
RDW: 15.8 % — ABNORMAL HIGH (ref 11.5–15.5)
WBC: 8.3 10*3/uL (ref 4.0–10.5)
nRBC: 0 % (ref 0.0–0.2)

## 2023-03-26 LAB — COMPREHENSIVE METABOLIC PANEL
ALT: 7 U/L (ref 0–44)
AST: 19 U/L (ref 15–41)
Albumin: 3.8 g/dL (ref 3.5–5.0)
Alkaline Phosphatase: 130 U/L — ABNORMAL HIGH (ref 38–126)
Anion gap: 7 (ref 5–15)
BUN: 17 mg/dL (ref 8–23)
CO2: 30 mmol/L (ref 22–32)
Calcium: 9 mg/dL (ref 8.9–10.3)
Chloride: 97 mmol/L — ABNORMAL LOW (ref 98–111)
Creatinine, Ser: 1.27 mg/dL — ABNORMAL HIGH (ref 0.61–1.24)
GFR, Estimated: 54 mL/min — ABNORMAL LOW (ref >60)
Glucose, Bld: 119 mg/dL — ABNORMAL HIGH (ref 70–99)
Potassium: 4.5 mmol/L (ref 3.5–5.1)
Sodium: 134 mmol/L — ABNORMAL LOW (ref 135–145)
Total Bilirubin: 0.5 mg/dL (ref 0.3–1.2)
Total Protein: 7.2 g/dL (ref 6.5–8.1)

## 2023-03-26 NOTE — Progress Notes (Signed)
HPI:   Steven Cain was previously seen in the [place] [facility] [facility] clinic due to a personal and family history of cancer and concerns regarding a hereditary predisposition to cancer. Please refer to our prior cancer genetics clinic note for more information regarding our discussion, assessment and recommendations, at the time.   FAMILY HISTORY:  We obtained a detailed, 4-generation family history.  Significant diagnoses are listed below:      Family History  Problem Relation Age of Onset   Colon cancer Mother 49 - 42   Breast cancer Mother 33 - 53   Prostate cancer Father 63   Thyroid cancer Brother 64        medullary   Multiple myeloma Maternal Aunt 81 - 79   Breast cancer Maternal Aunt 41 - 49   Colon cancer Maternal Aunt     Pancreatic cancer Maternal Uncle 35   Breast cancer Daughter 18   Breast cancer Cousin 46 - 11        maternal first cousin   Breast cancer Cousin 51 - 33        maternal first cousin   Thyroid cancer Cousin 61 - 49   Esophageal cancer Cousin 47 - 58        maternal first cousin   Heart disease Neg Hx             Steven Cain daughter was diagnosed with breast cancer at age 68 and reportedly had negative BRCA1/2 testing. His identical twin brother was diagnosed with medullary thyroid cancer at age 77, he died one week later. Steven Cain mother was diagnosed with breast cancer in her 25s and colon cancer in her late 16s, she died at age 62. His maternal uncle was diagnosed with pancreatic cancer at age 71, he died at 87. He had 3 maternal aunts, 1 was diagnosed with multiple myeloma in her 5s, 1 was diagnosed with breast cancer in her 38s and colon cancer at an unknown age, they are deceased. He has 2 maternal first cousins with a history of breast cancer. He has Ashkenazi Jewish ancestry.    GENETIC TEST RESULTS:  The [facility] Custom Panel found no pathogenic mutations.   The Custom Hereditary Cancers Panel offered by [facility] includes sequencing  and/or deletion duplication testing of the following 43 genes: APC, ATM, AXIN2, BAP1, BARD1, BMPR1A, BRCA1, BRCA2, BRIP1, CDH1, CDK4, CDKN2A (p14ARF and p16INK4a only), CHEK2, CTNNA1, EPCAM (Deletion/duplication testing only), FH, GREM1 (promoter region duplication testing only), HOXB13, KIT, MBD4, MEN1, MLH1, MSH2, MSH3, MSH6, MUTYH, NF1, NHTL1, PALB2, PDGFRA, PMS2, POLD1, POLE, PTEN, RAD51C, RAD51D, SMAD4, SMARCA4. STK11, TP53, TSC1, TSC2, and VHL.   The test report has been scanned into EPIC and is located under the Molecular Pathology section of the Results Review tab.  A portion of the result report is included below for reference. Genetic testing reported out on 02/04/2023.     Even though a pathogenic variant was not identified, possible explanations for the cancer in the family may include: There may be no hereditary risk for cancer in the family. The cancers in Steven Cain and/or his family may be due to other genetic or environmental factors. There may be a gene mutation in one of these genes that current testing methods cannot detect, but that chance is small. There could be another gene that has not yet been discovered, or that we have not yet tested, that is responsible for the cancer diagnoses in the family.  It is also  possible there is a hereditary cause for the cancer in the family that Steven Cain did not inherit.  Therefore, it is important to remain in touch with cancer genetics in the future so that we can continue to offer Mr. Channer the most up to date genetic testing.   ADDITIONAL GENETIC TESTING:  We discussed with Mr. Blood that his genetic testing was fairly extensive.  If there are genes identified to increase cancer risk that can be analyzed in the future, we would be happy to discuss and coordinate this testing at that time.    CANCER SCREENING RECOMMENDATIONS:  Steven Cain test result is considered negative (normal).  This means that we have not identified a hereditary  cause for his personal and family history of cancer at this time.   An individual's cancer risk and medical management are not determined by genetic test results alone. Overall cancer risk assessment incorporates additional factors, including personal medical history, family history, and any available genetic information that may result in a personalized plan for cancer prevention and surveillance. Therefore, it is recommended he continue to follow the cancer management and screening guidelines provided by his oncology and primary healthcare provider.  RECOMMENDATIONS FOR FAMILY MEMBERS:   Since he did not inherit a mutation in a cancer predisposition gene included on this panel, his children could not have inherited a mutation from him in one of these genes.  FOLLOW-UP:  Cancer genetics is a rapidly advancing field and it is possible that new genetic tests will be appropriate for him and/or his family members in the future. We encourage him to remain in contact with cancer genetics on an annual basis so we can update his personal and family histories and let him know of advances in cancer genetics that may benefit this family.   Our contact number was provided. Steven Cain questions were answered to his satisfaction, and he knows he is welcome to call us at anytime with additional questions or concerns.   Lalla Brothers, MS, [facility] Genetic Counselor [email or web address] (P) [phone]

## 2023-03-26 NOTE — Progress Notes (Signed)
[facility]   [phone] [phone]   Clinic Follow up Note   Patient Care Team: [name], MD as PCP - General (Internal Medicine) Cherlyn Cushing, RN as Oncology Nurse Navigator Malachy Mood, MD as Consulting Physician (Hematology and Oncology)  Date of Service:  03/26/2023  CHIEF COMPLAINT: f/u of Prostate cancer metastatic to bone   CURRENT THERAPY: Eligard injection    ASSESSMENT:  Steven Cain is a 89 y.o. male with   Prostate cancer metastatic to bone [facility]) Diagnosed with metastatic prostate cancer to bones in October 2021 -He has been on ADT, Zytiga and prednisone since then.  He had a brief response initially, then his PSA has been steadily increasing, with recent PSA 318 on November 23, 2022.  His testosterone level has been undetectable. -He has clearly developed castration resistance. --I previously discussed other systemic treatment options.  He has previously received Xofigo injections which did not help.  Due to his advanced age and medical comorbidities, I do not think he is a candidate for chemotherapy such as docetaxel.   -He was referred to Dr. Peterson Lombard for evaluation Pluvicto, unfortunately he is not a candidate for Pluvicto due to his CKD and urinary incontinence  -He will continue ADT under his urologist Dr. Kathrynn Running, okay to stop Zytiga and prednisone since his disease has progressed. -I previously reviewed his [facility] report, which showed PTEN loss, and TMPRSS2 TMPRSS2-ERG fusion.  Microsatellite status in the tumor mutation burden was not able to be determined, unfortunately he has no targeted therapy options based on the results. -He was referred to nuclear [facility] radiologist Dr. Debarah Crape, unfortunately he was not felt to be a poor candidate for Pluvicto due to his CKD and urinary incontinence.  I explained to patient today, he voiced good understanding. -I recommend palliative home care, and transition to hospice when he develops  more symptoms.  Patient and his daughter daughter all agreed.  Will refer [facility] to [facility] for palliative home care service  -f/u as needed    PLAN: - reviewed genetic testing - referral for palliative care/[facility]  - f/u as needed -He will continue Eligard under his urologist care.     INTERVAL HISTORY:  Steven Cain is here for a follow up of Prostate cancer metastatic to bone. He was last seen by me on 12/22/2022. He presents to clinic today with his daughter. Patient is hard of hearing. His son is on the phone. He is not a candidate for the [facility] treatment because of his incontinence.   All other systems were reviewed with the patient and are negative.  MEDICAL HISTORY:  Past Medical History:  Diagnosis Date   Abnormal EKG    hx of left anterior fasicular block on ekg, no cardiologist   Anxiety    AR (aortic regurgitation) 03/23/2017   Mild, Noted on ECHO   Arthritis    DJD   Benign enlargement of prostate    frequent UTI, trim of prostate done , with some issues of incontinence now.   Cancer (HCC)    Chronic bronchitis (HCC)    chronic- usually has phelgm often   COVID-19 08/2019   fatigue and loss of taste and smell x 10 days did monoclonal antibody therapy   Depression    Fluid retention in legs    resolved   GERD (gastroesophageal reflux disease)    rare   GI bleed    Grade I diastolic dysfunction    Hard  of hearing    hear more on left side- no hearing aid   History of colon polyps    History of transfusion 2016   Hydronephrosis 2018   Left ckd stage 3 no nephrologist per wife adele   Hypertension    Moderate concentric left ventricular hypertrophy 03/23/2017   Noted on ECHO   Nausea & vomiting 06/08/2018   Perforated, severe stomach ulcer (HCC) 1974   Peripheral neuropathy    feet and toes   Pneumonia    Prostate cancer metastatic to bone [facility])    Spinal stenosis    Stroke (HCC)    cerebellalr stroke   TR (tricuspid regurgitation)  03/23/2017   Mild, Noted on ECHO   Transfusion history    none recent   Ureteral stricture    CHRONIC   Urinary incontinence    UTI (urinary tract infection)    frequent   Wears glasses     SURGICAL HISTORY: Past Surgical History:  Procedure Laterality Date   ABDOMINAL SURGERY     bilroths tube '74   APPENDECTOMY     COLONOSCOPY N/A 09/29/2014   Procedure: COLONOSCOPY;  Surgeon: Theda Belfast, MD;  Location: [facility];  Service: Endoscopy;  Laterality: N/A;   CYSTOSCOPY W/ URETERAL STENT PLACEMENT Left 05/06/2014   Procedure: CYSTOSCOPY WITH RETROGRADE PYELOGRAM/URETERAL STENT PLACEMENT;  Surgeon: Sebastian Ache, MD;  Location: [facility];  Service: Urology;  Laterality: Left;   CYSTOSCOPY W/ URETERAL STENT PLACEMENT Left 03/31/2015   Procedure: CYSTOSCOPY WITH LEFT RETROGRADE PYELOGRAM/URETERAL STENT EXCHANGE;  Surgeon: Sebastian Ache, MD;  Location: [facility];  Service: Urology;  Laterality: Left;   CYSTOSCOPY W/ URETERAL STENT PLACEMENT Left 09/15/2015   Procedure: CYSTOSCOPY WITH LEFT RETROGRADE PYELOGRAM/URETERAL STENT EXCHANGE ;  Surgeon: Sebastian Ache, MD;  Location: [facility];  Service: Urology;  Laterality: Left;   CYSTOSCOPY W/ URETERAL STENT PLACEMENT Left 04/28/2016   Procedure: CYSTOSCOPY WITH RETROGRADE PYELOGRAM/URETERAL STENT EXCHANGE;  Surgeon: Sebastian Ache, MD;  Location: [facility];  Service: Urology;  Laterality: Left;   CYSTOSCOPY W/ URETERAL STENT PLACEMENT Left 11/08/2016   Procedure: CYSTOSCOPY WITH RETROGRADE PYELOGRAM/URETERAL STENT EXCHANGE;  Surgeon: Sebastian Ache, MD;  Location: [facility];  Service: Urology;  Laterality: Left;   CYSTOSCOPY W/ URETERAL STENT PLACEMENT Left 09/14/2017   Procedure: CYSTOSCOPY WITH RETROGRADE PYELOGRAM/URETERAL STENT PLACEMENT;  Surgeon: Sebastian Ache, MD;  Location: [facility];  Service: Urology;  Laterality: Left;   CYSTOSCOPY W/ URETERAL STENT PLACEMENT Left 04/10/2018   Procedure: CYSTOSCOPY WITH LEFT RETROGRADE URETERAL STENT  EXCHANGE;  Surgeon: Sebastian Ache, MD;  Location[facility];  Service: Urology;  Laterality: Left;   CYSTOSCOPY W/ URETERAL STENT PLACEMENT Left 01/31/2019   Procedure: CYSTOSCOPY WITH RETROGRADE PYELOGRAM/URETERAL STENT PLACEMENT;  Surgeon: Sebastian Ache, MD;  Location: [facility];  Service: Urology;  Laterality: Left;  45 MINS   CYSTOSCOPY W/ URETERAL STENT PLACEMENT Left 01/16/2020   Procedure: CYSTOSCOPY WITH RETROGRADE PYELOGRAM/URETERAL STENT EXCHANGED;  Surgeon: Sebastian Ache, MD;  Location: [facility];  Service: Urology;  Laterality: Left;   CYSTOSCOPY W/ URETERAL STENT PLACEMENT Left 04/29/2021   Procedure: CYSTOSCOPY WITH RETROGRADE PYELOGRAM/URETERAL STENT EXCHANGE;  Surgeon: Sebastian Ache, MD;  [facility];  Service: Urology;  Laterality: Left;   CYSTOSCOPY W/ URETERAL STENT PLACEMENT Left 09/06/2022   Procedure: CYSTOSCOPY WITH RETROGRADE PYELOGRAM/URETERAL STENT EXCHANGE WITH LITHOLAPAXY bladder;  Surgeon: Sebastian Ache, MD;  Location: [facility];  Service: Urology;  Laterality: Left;  1 HR   CYSTOSCOPY WITH RETROGRADE PYELOGRAM, URETEROSCOPY  AND STENT PLACEMENT Left 10/21/2014   Procedure: CYSTOSCOPY WITH RETROGRADE PYELOGRAM, URETEROSCOPY,BIOPSY AND STENT CHANGE;  Surgeon: Sebastian Ache, MD;  [facility];  Service: Urology;  Laterality: Left;   CYSTOSCOPY/RETROGRADE/URETEROSCOPY Left 06/03/2014   Procedure: CYSTOSCOPY/RETROGRADE/URETEROSCOPY WITH BIOPSY,STENT EXCHANGE;  Surgeon: Sebastian Ache, MD;  Location: [facility];  Service: Urology;  Laterality: Left;   ESOPHAGOGASTRODUODENOSCOPY N/A 09/28/2014   Procedure: ESOPHAGOGASTRODUODENOSCOPY (EGD);  Surgeon: Theda Belfast, MD;  Location: [facility];  Service: Endoscopy;  Laterality: N/A;   GASTRIC ROUX-EN-Y     '74   HERNIA REPAIR     bilateral hernia repairs    IR ANGIO INTRA EXTRACRAN SEL COM CAROTID INNOMINATE BILAT MOD SED  01/20/2021   IR ANGIO VERTEBRAL SEL SUBCLAVIAN INNOMINATE  BILAT MOD SED  01/20/2021   IR US GUIDE VASC ACCESS RIGHT  01/20/2021   LAPAROSCOPIC APPENDECTOMY N/A 02/15/2020   Procedure: APPENDECTOMY LAPAROSCOPIC;  Surgeon: Griselda Miner, MD;  Location: [facility];  Service: General;  Laterality: N/A;   TONSILLECTOMY  as child   TOTAL HIP ARTHROPLASTY Left 05/19/2016   Procedure: LEFT TOTAL HIP ARTHROPLASTY ANTERIOR APPROACH;  Surgeon: Kathryne Hitch, MD;  Location: [facility];  Service: Orthopedics;  Laterality: Left;   TRANSURETHRAL RESECTION OF PROSTATE     TRANSURETHRAL RESECTION OF PROSTATE N/A 09/06/2022   Procedure: REPEAT TRANSURETHRAL RESECTION OF THE PROSTATE (TURP);  Surgeon: Sebastian Ache, MD;  Location: [facility];  Service: Urology;  Laterality: N/A;   VAGOTOMY  1950's    I have reviewed the social history and family history with the patient and they are unchanged from previous note.  ALLERGIES:  is allergic to bee venom.  MEDICATIONS:  Current Outpatient Medications  Medication Sig Dispense Refill   abiraterone acetate (ZYTIGA) 250 MG tablet Take 1,000 mg by mouth daily.     acetaminophen (TYLENOL) 500 MG tablet Take 1 tablet (500 mg total) by mouth every 6 (six) hours as needed for mild pain. (Patient taking differently: Take 1,000 mg by mouth every 6 (six) hours as needed for mild pain.)     albuterol (VENTOLIN HFA) 108 (90 Base) MCG/ACT inhaler Inhale 2 puffs into the lungs every 6 (six) hours as needed for wheezing or shortness of breath.     Ascorbic Acid (VITAMIN C) 1000 MG tablet Take 1,000 mg by mouth daily.     aspirin EC 81 MG tablet Take 81 mg by mouth daily.     Camphor-Eucalyptus-Menthol (VICKS VAPORUB EX) Apply 1 Application topically daily as needed (congestion).     cyanocobalamin (,VITAMIN B-12,) 1000 MCG/ML injection Inject 1,000 mcg into the muscle every 30 (thirty) days.     Denosumab (XGEVA [place]) Inject 1 Dose into the skin every 28 (twenty-eight) days.     diclofenac Sodium (VOLTAREN) 1 % GEL Apply 1 Application  topically daily as needed (pain).     hydroxypropyl methylcellulose / hypromellose (ISOPTO TEARS / GONIOVISC) 2.5 % ophthalmic solution Place 1 drop into both eyes daily as needed for dry eyes.     leuprolide, 6 Month, (ELIGARD) 45 MG injection Inject 45 mg into the skin every 6 (six) months.     Magnesium 250 MG TABS Take 250 mg by mouth daily.     meclizine (ANTIVERT) 25 MG tablet Take 25 mg by mouth See admin instructions. Take 25 mg daily, may take a second 25 mg dose as needed for dizziness     melatonin 3 MG TABS tablet Take 3 mg by mouth at bedtime.  Misc Natural Products (NEURIVA PO) Take 1 capsule by mouth daily.     Multiple Vitamin (MULTIVITAMIN WITH MINERALS) TABS tablet Take 1 tablet by mouth every morning.     Omega-3 Fatty Acids (FISH OIL) 1200 MG CAPS Take 1,200 mg by mouth daily.     OVER THE COUNTER MEDICATION Take 2 capsules by mouth daily. Dynamic brain otc supplement     oxybutynin (DITROPAN) 5 MG tablet Take 5 mg by mouth 2 (two) times daily.     oxyCODONE (ROXICODONE) 5 MG immediate release tablet Take 1 tablet (5 mg total) by mouth every 8 (eight) hours as needed. For post-op pain and cancer pain 20 tablet 0   PARoxetine (PAXIL) 40 MG tablet Take 1 tablet (40 mg total) by mouth daily. (Patient taking differently: Take 40 mg by mouth at bedtime.) 30 tablet 11   predniSONE (DELTASONE) 5 MG tablet Take 5 mg by mouth daily.     senna-docusate (SENOKOT-S) 8.6-50 MG tablet Take 1 tablet by mouth at bedtime. (Patient taking differently: Take 2 tablets by mouth 2 (two) times daily.) 30 tablet 0   traZODone (DESYREL) 50 MG tablet Take 50 mg by mouth at bedtime.     vitamin E 180 MG (400 UNITS) capsule Take 400 Units by mouth every Wednesday.     Zinc 50 MG TABS Take 50 mg by mouth daily.     No current facility-administered medications for this visit.    PHYSICAL EXAMINATION: ECOG PERFORMANCE STATUS: 3 - Symptomatic, >50% confined to bed  Vitals:   03/26/23 1120  BP: (!)  157/78  Pulse: 83  Resp: 18  Temp: (!) 97.5 F (36.4 C)  SpO2: 94%   Wt Readings from Last 3 Encounters:  03/26/23 156 lb 14.4 oz (71.2 kg)  01/26/23 158 lb 6.4 oz (71.8 kg)  12/22/22 158 lb 9.6 oz (71.9 kg)     GENERAL:alert, no distress and comfortable SKIN: skin color, texture, turgor are normal, no rashes or significant lesions EYES: normal, Conjunctiva are pink and non-injected, sclera clear NECK: supple, thyroid normal size, non-tender, without nodularity LYMPH:  no palpable lymphadenopathy in the cervical, axillary  LUNGS: clear to auscultation and percussion with normal breathing effort HEART: regular rate & rhythm and no murmurs and no lower extremity edema ABDOMEN:abdomen soft, non-tender and normal bowel sounds Musculoskeletal:no cyanosis of digits and no clubbing  NEURO: alert & oriented x 3 with fluent speech, no focal motor/sensory deficits  LABORATORY DATA:  I have reviewed the data as listed    Latest Ref Rng & Units 03/26/2023   10:43 AM 01/26/2023    1:36 PM 01/12/2023   12:24 PM  CBC  WBC 4.0 - 10.5 K/uL 8.3  9.9  7.8   Hemoglobin 13.0 - 17.0 g/dL 57.8  46.9  62.9   Hematocrit 39.0 - 52.0 % 38.4  40.3  39.6   Platelets 150 - 400 K/uL 249  229  252         Latest Ref Rng & Units 03/26/2023   10:43 AM 01/26/2023    1:36 PM 01/12/2023   12:24 PM  CMP  Glucose 70 - 99 mg/dL 528  413  244   BUN 8 - 23 mg/dL 17  23  17    Creatinine 0.61 - 1.24 mg/dL 0.10  2.72  5.36   Sodium 135 - 145 mmol/L 134  134  136   Potassium 3.5 - 5.1 mmol/L 4.5  4.2  4.8   Chloride 98 - 111  mmol/L 97  98  98   CO2 22 - 32 mmol/L 30  29  31    Calcium 8.9 - 10.3 mg/dL 9.0  9.4  9.2   Total Protein 6.5 - 8.1 g/dL 7.2  6.8  7.2   Total Bilirubin 0.3 - 1.2 mg/dL 0.5  0.4  0.4   Alkaline Phos 38 - 126 U/L 130  145  153   AST 15 - 41 U/L 19  14  15    ALT 0 - 44 U/L 7  5  6        RADIOGRAPHIC STUDIES: I have personally reviewed the radiological images as listed and agreed with  the findings in the report. No results found.    Orders Placed This Encounter  Procedures   Amb Referral to Palliative Care    Referral Priority:   Routine    Referral Type:   Consultation    Referral Reason:   Symptom Managment    Number of Visits Requested:   1   All questions were answered. The patient knows to call the clinic with any problems, questions or concerns. No barriers to learning was detected. The total time spent in the appointment was 30 minutes.     Malachy Mood, MD 03/26/2023   I, Sharlette Dense, CMA, am acting as scribe for Malachy Mood, MD.   I have reviewed the above documentation for accuracy and completeness, and I agree with the above.

## 2023-03-27 ENCOUNTER — Inpatient Hospital Stay: Payer: Medicare Other

## 2023-03-27 NOTE — Progress Notes (Signed)
Faxed Dr. Latanya Maudlin office note to Dr. Bjorn Pippin w/[facility] [phone]).  Fax confirmation received.    Haynes Bast, RN w/[facility] notified of pt's referral to Palliative Care.

## 2023-03-28 DIAGNOSIS — C61 Malignant neoplasm of prostate: Secondary | ICD-10-CM | POA: Diagnosis not present

## 2023-03-28 DIAGNOSIS — C7951 Secondary malignant neoplasm of bone: Secondary | ICD-10-CM | POA: Diagnosis not present

## 2023-04-03 ENCOUNTER — Other Ambulatory Visit (HOSPITAL_COMMUNITY): Payer: Medicare Other

## 2023-04-06 DIAGNOSIS — N1832 Chronic kidney disease, stage 3b: Secondary | ICD-10-CM | POA: Diagnosis not present

## 2023-04-06 DIAGNOSIS — Z66 Do not resuscitate: Secondary | ICD-10-CM | POA: Diagnosis not present

## 2023-04-06 DIAGNOSIS — R627 Adult failure to thrive: Secondary | ICD-10-CM | POA: Diagnosis not present

## 2023-04-06 DIAGNOSIS — C7951 Secondary malignant neoplasm of bone: Secondary | ICD-10-CM | POA: Diagnosis not present

## 2023-04-06 DIAGNOSIS — I951 Orthostatic hypotension: Secondary | ICD-10-CM | POA: Diagnosis not present

## 2023-04-06 DIAGNOSIS — I441 Atrioventricular block, second degree: Secondary | ICD-10-CM | POA: Diagnosis not present

## 2023-04-06 DIAGNOSIS — D692 Other nonthrombocytopenic purpura: Secondary | ICD-10-CM | POA: Diagnosis not present

## 2023-04-06 DIAGNOSIS — R001 Bradycardia, unspecified: Secondary | ICD-10-CM | POA: Diagnosis not present

## 2023-04-06 DIAGNOSIS — C61 Malignant neoplasm of prostate: Secondary | ICD-10-CM | POA: Diagnosis not present

## 2023-04-06 DIAGNOSIS — Z0000 Encounter for general adult medical examination without abnormal findings: Secondary | ICD-10-CM | POA: Diagnosis not present

## 2023-04-06 DIAGNOSIS — F325 Major depressive disorder, single episode, in full remission: Secondary | ICD-10-CM | POA: Diagnosis not present

## 2023-04-06 DIAGNOSIS — I129 Hypertensive chronic kidney disease with stage 1 through stage 4 chronic kidney disease, or unspecified chronic kidney disease: Secondary | ICD-10-CM | POA: Diagnosis not present

## 2023-04-21 DIAGNOSIS — Z23 Encounter for immunization: Secondary | ICD-10-CM | POA: Diagnosis not present

## 2023-04-25 DIAGNOSIS — C7951 Secondary malignant neoplasm of bone: Secondary | ICD-10-CM | POA: Diagnosis not present

## 2023-04-25 DIAGNOSIS — C61 Malignant neoplasm of prostate: Secondary | ICD-10-CM | POA: Diagnosis not present

## 2023-05-08 ENCOUNTER — Inpatient Hospital Stay: Payer: Medicare Other | Attending: Hematology

## 2023-05-10 DIAGNOSIS — N3000 Acute cystitis without hematuria: Secondary | ICD-10-CM | POA: Diagnosis not present

## 2023-05-10 DIAGNOSIS — C61 Malignant neoplasm of prostate: Secondary | ICD-10-CM | POA: Diagnosis not present

## 2023-05-15 ENCOUNTER — Other Ambulatory Visit (HOSPITAL_COMMUNITY): Payer: Medicare Other

## 2023-05-17 DIAGNOSIS — N3941 Urge incontinence: Secondary | ICD-10-CM | POA: Diagnosis not present

## 2023-05-17 DIAGNOSIS — C61 Malignant neoplasm of prostate: Secondary | ICD-10-CM | POA: Diagnosis not present

## 2023-05-17 DIAGNOSIS — N135 Crossing vessel and stricture of ureter without hydronephrosis: Secondary | ICD-10-CM | POA: Diagnosis not present

## 2023-05-17 DIAGNOSIS — C7951 Secondary malignant neoplasm of bone: Secondary | ICD-10-CM | POA: Diagnosis not present

## 2023-05-23 DIAGNOSIS — C7951 Secondary malignant neoplasm of bone: Secondary | ICD-10-CM | POA: Diagnosis not present

## 2023-05-23 DIAGNOSIS — C61 Malignant neoplasm of prostate: Secondary | ICD-10-CM | POA: Diagnosis not present

## 2023-06-10 ENCOUNTER — Emergency Department (HOSPITAL_BASED_OUTPATIENT_CLINIC_OR_DEPARTMENT_OTHER): Payer: Medicare Other

## 2023-06-10 ENCOUNTER — Other Ambulatory Visit: Payer: Self-pay

## 2023-06-10 ENCOUNTER — Inpatient Hospital Stay (HOSPITAL_BASED_OUTPATIENT_CLINIC_OR_DEPARTMENT_OTHER)
Admission: EM | Admit: 2023-06-10 | Discharge: 2023-06-20 | DRG: 871 | Disposition: A | Discharge status: Hospice | Payer: Medicare Other | Attending: Family Medicine | Admitting: Family Medicine

## 2023-06-10 ENCOUNTER — Encounter (HOSPITAL_BASED_OUTPATIENT_CLINIC_OR_DEPARTMENT_OTHER): Payer: Self-pay

## 2023-06-10 DIAGNOSIS — D62 Acute posthemorrhagic anemia: Secondary | ICD-10-CM | POA: Diagnosis not present

## 2023-06-10 DIAGNOSIS — N1831 Chronic kidney disease, stage 3a: Secondary | ICD-10-CM | POA: Diagnosis present

## 2023-06-10 DIAGNOSIS — H9190 Unspecified hearing loss, unspecified ear: Secondary | ICD-10-CM | POA: Diagnosis present

## 2023-06-10 DIAGNOSIS — N400 Enlarged prostate without lower urinary tract symptoms: Secondary | ICD-10-CM | POA: Diagnosis present

## 2023-06-10 DIAGNOSIS — E039 Hypothyroidism, unspecified: Secondary | ICD-10-CM | POA: Diagnosis present

## 2023-06-10 DIAGNOSIS — A4152 Sepsis due to Pseudomonas: Principal | ICD-10-CM | POA: Diagnosis present

## 2023-06-10 DIAGNOSIS — M545 Low back pain, unspecified: Secondary | ICD-10-CM | POA: Diagnosis not present

## 2023-06-10 DIAGNOSIS — F05 Delirium due to known physiological condition: Secondary | ICD-10-CM | POA: Diagnosis not present

## 2023-06-10 DIAGNOSIS — B952 Enterococcus as the cause of diseases classified elsewhere: Secondary | ICD-10-CM | POA: Diagnosis present

## 2023-06-10 DIAGNOSIS — C7951 Secondary malignant neoplasm of bone: Secondary | ICD-10-CM | POA: Diagnosis not present

## 2023-06-10 DIAGNOSIS — J4489 Other specified chronic obstructive pulmonary disease: Secondary | ICD-10-CM | POA: Diagnosis present

## 2023-06-10 DIAGNOSIS — I10 Essential (primary) hypertension: Secondary | ICD-10-CM | POA: Diagnosis present

## 2023-06-10 DIAGNOSIS — D638 Anemia in other chronic diseases classified elsewhere: Secondary | ICD-10-CM | POA: Diagnosis present

## 2023-06-10 DIAGNOSIS — J42 Unspecified chronic bronchitis: Secondary | ICD-10-CM | POA: Diagnosis present

## 2023-06-10 DIAGNOSIS — C61 Malignant neoplasm of prostate: Principal | ICD-10-CM | POA: Diagnosis present

## 2023-06-10 DIAGNOSIS — Z96642 Presence of left artificial hip joint: Secondary | ICD-10-CM | POA: Diagnosis present

## 2023-06-10 DIAGNOSIS — E860 Dehydration: Secondary | ICD-10-CM | POA: Diagnosis present

## 2023-06-10 DIAGNOSIS — Z1152 Encounter for screening for COVID-19: Secondary | ICD-10-CM

## 2023-06-10 DIAGNOSIS — F32A Depression, unspecified: Secondary | ICD-10-CM | POA: Diagnosis present

## 2023-06-10 DIAGNOSIS — Z7401 Bed confinement status: Secondary | ICD-10-CM | POA: Diagnosis not present

## 2023-06-10 DIAGNOSIS — C801 Malignant (primary) neoplasm, unspecified: Secondary | ICD-10-CM | POA: Diagnosis not present

## 2023-06-10 DIAGNOSIS — Z515 Encounter for palliative care: Secondary | ICD-10-CM

## 2023-06-10 DIAGNOSIS — G9341 Metabolic encephalopathy: Secondary | ICD-10-CM | POA: Diagnosis not present

## 2023-06-10 DIAGNOSIS — N3000 Acute cystitis without hematuria: Secondary | ICD-10-CM | POA: Diagnosis not present

## 2023-06-10 DIAGNOSIS — R4182 Altered mental status, unspecified: Secondary | ICD-10-CM | POA: Diagnosis not present

## 2023-06-10 DIAGNOSIS — Z91030 Bee allergy status: Secondary | ICD-10-CM

## 2023-06-10 DIAGNOSIS — Z860100 Personal history of colon polyps, unspecified: Secondary | ICD-10-CM

## 2023-06-10 DIAGNOSIS — R0682 Tachypnea, not elsewhere classified: Secondary | ICD-10-CM | POA: Diagnosis not present

## 2023-06-10 DIAGNOSIS — N1832 Chronic kidney disease, stage 3b: Secondary | ICD-10-CM | POA: Diagnosis not present

## 2023-06-10 DIAGNOSIS — E639 Nutritional deficiency, unspecified: Secondary | ICD-10-CM | POA: Diagnosis present

## 2023-06-10 DIAGNOSIS — G8929 Other chronic pain: Secondary | ICD-10-CM | POA: Diagnosis present

## 2023-06-10 DIAGNOSIS — R531 Weakness: Secondary | ICD-10-CM | POA: Diagnosis not present

## 2023-06-10 DIAGNOSIS — E871 Hypo-osmolality and hyponatremia: Secondary | ICD-10-CM | POA: Diagnosis present

## 2023-06-10 DIAGNOSIS — K5900 Constipation, unspecified: Secondary | ICD-10-CM | POA: Diagnosis present

## 2023-06-10 DIAGNOSIS — R569 Unspecified convulsions: Secondary | ICD-10-CM | POA: Diagnosis present

## 2023-06-10 DIAGNOSIS — M4316 Spondylolisthesis, lumbar region: Secondary | ICD-10-CM | POA: Diagnosis not present

## 2023-06-10 DIAGNOSIS — I672 Cerebral atherosclerosis: Secondary | ICD-10-CM | POA: Diagnosis not present

## 2023-06-10 DIAGNOSIS — R6520 Severe sepsis without septic shock: Secondary | ICD-10-CM | POA: Diagnosis present

## 2023-06-10 DIAGNOSIS — Z66 Do not resuscitate: Secondary | ICD-10-CM | POA: Diagnosis not present

## 2023-06-10 DIAGNOSIS — R160 Hepatomegaly, not elsewhere classified: Secondary | ICD-10-CM | POA: Diagnosis not present

## 2023-06-10 DIAGNOSIS — Z7982 Long term (current) use of aspirin: Secondary | ICD-10-CM

## 2023-06-10 DIAGNOSIS — Z87891 Personal history of nicotine dependence: Secondary | ICD-10-CM

## 2023-06-10 DIAGNOSIS — Z8673 Personal history of transient ischemic attack (TIA), and cerebral infarction without residual deficits: Secondary | ICD-10-CM

## 2023-06-10 DIAGNOSIS — D849 Immunodeficiency, unspecified: Secondary | ICD-10-CM | POA: Diagnosis present

## 2023-06-10 DIAGNOSIS — J9811 Atelectasis: Secondary | ICD-10-CM | POA: Diagnosis not present

## 2023-06-10 DIAGNOSIS — C787 Secondary malignant neoplasm of liver and intrahepatic bile duct: Secondary | ICD-10-CM | POA: Diagnosis present

## 2023-06-10 DIAGNOSIS — Z993 Dependence on wheelchair: Secondary | ICD-10-CM

## 2023-06-10 DIAGNOSIS — G9340 Encephalopathy, unspecified: Secondary | ICD-10-CM | POA: Diagnosis present

## 2023-06-10 DIAGNOSIS — K219 Gastro-esophageal reflux disease without esophagitis: Secondary | ICD-10-CM | POA: Diagnosis present

## 2023-06-10 DIAGNOSIS — F0390 Unspecified dementia without behavioral disturbance: Secondary | ICD-10-CM | POA: Diagnosis not present

## 2023-06-10 DIAGNOSIS — N390 Urinary tract infection, site not specified: Secondary | ICD-10-CM | POA: Diagnosis present

## 2023-06-10 DIAGNOSIS — T361X5A Adverse effect of cephalosporins and other beta-lactam antibiotics, initial encounter: Secondary | ICD-10-CM | POA: Diagnosis present

## 2023-06-10 DIAGNOSIS — E861 Hypovolemia: Secondary | ICD-10-CM | POA: Diagnosis present

## 2023-06-10 DIAGNOSIS — Z960 Presence of urogenital implants: Secondary | ICD-10-CM | POA: Diagnosis not present

## 2023-06-10 DIAGNOSIS — A419 Sepsis, unspecified organism: Secondary | ICD-10-CM | POA: Diagnosis present

## 2023-06-10 DIAGNOSIS — I129 Hypertensive chronic kidney disease with stage 1 through stage 4 chronic kidney disease, or unspecified chronic kidney disease: Secondary | ICD-10-CM | POA: Diagnosis present

## 2023-06-10 DIAGNOSIS — Z8616 Personal history of COVID-19: Secondary | ICD-10-CM | POA: Diagnosis not present

## 2023-06-10 DIAGNOSIS — B965 Pseudomonas (aeruginosa) (mallei) (pseudomallei) as the cause of diseases classified elsewhere: Secondary | ICD-10-CM | POA: Diagnosis present

## 2023-06-10 DIAGNOSIS — G629 Polyneuropathy, unspecified: Secondary | ICD-10-CM | POA: Diagnosis present

## 2023-06-10 DIAGNOSIS — Z8042 Family history of malignant neoplasm of prostate: Secondary | ICD-10-CM

## 2023-06-10 DIAGNOSIS — Z9049 Acquired absence of other specified parts of digestive tract: Secondary | ICD-10-CM

## 2023-06-10 DIAGNOSIS — Z7952 Long term (current) use of systemic steroids: Secondary | ICD-10-CM

## 2023-06-10 DIAGNOSIS — M47816 Spondylosis without myelopathy or radiculopathy, lumbar region: Secondary | ICD-10-CM | POA: Diagnosis not present

## 2023-06-10 DIAGNOSIS — R918 Other nonspecific abnormal finding of lung field: Secondary | ICD-10-CM | POA: Diagnosis not present

## 2023-06-10 DIAGNOSIS — Z79899 Other long term (current) drug therapy: Secondary | ICD-10-CM

## 2023-06-10 DIAGNOSIS — R7401 Elevation of levels of liver transaminase levels: Secondary | ICD-10-CM | POA: Diagnosis not present

## 2023-06-10 DIAGNOSIS — I6782 Cerebral ischemia: Secondary | ICD-10-CM | POA: Diagnosis not present

## 2023-06-10 DIAGNOSIS — M7989 Other specified soft tissue disorders: Secondary | ICD-10-CM | POA: Diagnosis not present

## 2023-06-10 DIAGNOSIS — D630 Anemia in neoplastic disease: Secondary | ICD-10-CM | POA: Diagnosis present

## 2023-06-10 LAB — CBC
HCT: 37.5 % — ABNORMAL LOW (ref 39.0–52.0)
Hemoglobin: 12.5 g/dL — ABNORMAL LOW (ref 13.0–17.0)
MCH: 29.8 pg (ref 26.0–34.0)
MCHC: 33.3 g/dL (ref 30.0–36.0)
MCV: 89.3 fL (ref 80.0–100.0)
Platelets: 278 10*3/uL (ref 150–400)
RBC: 4.2 MIL/uL — ABNORMAL LOW (ref 4.22–5.81)
RDW: 15 % (ref 11.5–15.5)
WBC: 9.8 10*3/uL (ref 4.0–10.5)
nRBC: 0 % (ref 0.0–0.2)

## 2023-06-10 LAB — COMPREHENSIVE METABOLIC PANEL
ALT: 11 U/L (ref 0–44)
AST: 38 U/L (ref 15–41)
Albumin: 3.3 g/dL — ABNORMAL LOW (ref 3.5–5.0)
Alkaline Phosphatase: 238 U/L — ABNORMAL HIGH (ref 38–126)
Anion gap: 13 (ref 5–15)
BUN: 16 mg/dL (ref 8–23)
CO2: 24 mmol/L (ref 22–32)
Calcium: 8.7 mg/dL — ABNORMAL LOW (ref 8.9–10.3)
Chloride: 90 mmol/L — ABNORMAL LOW (ref 98–111)
Creatinine, Ser: 1.24 mg/dL (ref 0.61–1.24)
GFR, Estimated: 55 mL/min — ABNORMAL LOW (ref >60)
Glucose, Bld: 114 mg/dL — ABNORMAL HIGH (ref 70–99)
Potassium: 4.1 mmol/L (ref 3.5–5.1)
Sodium: 127 mmol/L — ABNORMAL LOW (ref 135–145)
Total Bilirubin: 0.8 mg/dL (ref 0.3–1.2)
Total Protein: 7.5 g/dL (ref 6.5–8.1)

## 2023-06-10 LAB — URINALYSIS, ROUTINE W REFLEX MICROSCOPIC
Bilirubin Urine: NEGATIVE
Glucose, UA: NEGATIVE mg/dL
Ketones, ur: NEGATIVE mg/dL
Nitrite: NEGATIVE
Protein, ur: 100 mg/dL — AB
Specific Gravity, Urine: 1.02 (ref 1.005–1.030)
pH: 5.5 (ref 5.0–8.0)

## 2023-06-10 LAB — URINALYSIS, MICROSCOPIC (REFLEX): RBC / HPF: >50 RBC/hpf (ref 0–5)

## 2023-06-10 MED ORDER — SODIUM CHLORIDE 0.9 % IV SOLN
INTRAVENOUS | Status: AC
Start: 2023-06-11 — End: 2023-06-11

## 2023-06-10 MED ORDER — SODIUM CHLORIDE 0.9 % IV SOLN
2.0000 g | Freq: Two times a day (BID) | INTRAVENOUS | Status: AC
  Administered 2023-06-10 – 2023-06-13 (×6): 2 g via INTRAVENOUS
  Filled 2023-06-10 (×7): qty 12.5

## 2023-06-10 MED ORDER — HYDROCODONE-ACETAMINOPHEN 5-325 MG PO TABS: 1.0000 | ORAL_TABLET | ORAL | Status: DC | PRN

## 2023-06-10 MED ORDER — SENNOSIDES-DOCUSATE SODIUM 8.6-50 MG PO TABS
2.0000 | ORAL_TABLET | Freq: Two times a day (BID) | ORAL | Status: DC
  Administered 2023-06-11 – 2023-06-15 (×9): 2 via ORAL
  Filled 2023-06-10 (×10): qty 2

## 2023-06-10 MED ORDER — ACETAMINOPHEN 325 MG PO TABS
650.0000 mg | ORAL_TABLET | Freq: Four times a day (QID) | ORAL | Status: DC | PRN
  Administered 2023-06-11 (×3): 650 mg via ORAL
  Filled 2023-06-10 (×2): qty 2

## 2023-06-10 MED ORDER — ONDANSETRON HCL 4 MG PO TABS
4.0000 mg | ORAL_TABLET | Freq: Four times a day (QID) | ORAL | Status: DC | PRN
  Administered 2023-06-11 – 2023-06-20 (×4): 4 mg via ORAL
  Filled 2023-06-10 (×4): qty 1

## 2023-06-10 MED ORDER — INFLUENZA VAC A&B SURF ANT ADJ 0.5 ML IM SUSY
0.5000 mL | PREFILLED_SYRINGE | INTRAMUSCULAR | Status: DC
  Filled 2023-06-10: qty 0.5

## 2023-06-10 MED ORDER — SODIUM CHLORIDE 0.9 % IV BOLUS
1000.0000 mL | Freq: Once | INTRAVENOUS | Status: AC
  Administered 2023-06-10: 1000 mL via INTRAVENOUS

## 2023-06-10 MED ORDER — ONDANSETRON HCL 4 MG/2ML IJ SOLN
4.0000 mg | Freq: Four times a day (QID) | INTRAMUSCULAR | Status: DC | PRN
  Administered 2023-06-16: 4 mg via INTRAVENOUS
  Filled 2023-06-10: qty 2

## 2023-06-10 MED ORDER — IOHEXOL 300 MG/ML  SOLN
100.0000 mL | Freq: Once | INTRAMUSCULAR | Status: AC | PRN
  Administered 2023-06-10: 75 mL via INTRAVENOUS

## 2023-06-10 MED ORDER — METHOCARBAMOL 1000 MG/10ML IJ SOLN: 500.0000 mg | Freq: Four times a day (QID) | INTRAMUSCULAR | Status: DC | PRN

## 2023-06-10 MED ORDER — ACETAMINOPHEN 650 MG RE SUPP: 650.0000 mg | Freq: Four times a day (QID) | RECTAL | Status: DC | PRN

## 2023-06-10 MED ORDER — PREDNISONE 5 MG PO TABS
5.0000 mg | ORAL_TABLET | Freq: Every day | ORAL | Status: DC
  Administered 2023-06-11 – 2023-06-20 (×10): 5 mg via ORAL
  Filled 2023-06-10 (×10): qty 1

## 2023-06-10 MED ORDER — ASPIRIN 81 MG PO TBEC
81.0000 mg | DELAYED_RELEASE_TABLET | Freq: Every day | ORAL | Status: DC
  Administered 2023-06-11 – 2023-06-13 (×3): 81 mg via ORAL
  Filled 2023-06-10 (×3): qty 1

## 2023-06-10 MED ORDER — FENTANYL CITRATE PF 50 MCG/ML IJ SOSY
12.5000 ug | PREFILLED_SYRINGE | INTRAMUSCULAR | Status: DC | PRN
  Administered 2023-06-12: 12.5 ug via INTRAVENOUS
  Filled 2023-06-10: qty 1

## 2023-06-10 MED ORDER — ALBUTEROL SULFATE (2.5 MG/3ML) 0.083% IN NEBU: 2.5000 mg | INHALATION_SOLUTION | RESPIRATORY_TRACT | Status: DC | PRN

## 2023-06-10 NOTE — Progress Notes (Signed)
Patient is a poor historian. Will wait for family to arrive to obtain admission history. Steven Cain

## 2023-06-10 NOTE — ED Notes (Signed)
[facility] called for transport. 

## 2023-06-10 NOTE — ED Notes (Signed)
Patient transported to CT 

## 2023-06-10 NOTE — ED Notes (Signed)
This RN has called lab to add on urine culture.

## 2023-06-10 NOTE — Subjective & Objective (Signed)
He is brought in by his family because he's been more confused for the last 5 days.  He is also having more back pain. They suspected he had a UTI had prior hx of Psuedomonas treated him at home with Cipro but he is still confused  No head injury

## 2023-06-10 NOTE — Assessment & Plan Note (Signed)
In the setting of decreased PO intake will gently rehydrate Obtain urine elctrolytes

## 2023-06-10 NOTE — Progress Notes (Signed)
Pharmacy Antibiotic Note  Steven Cain is a 89 y.o. male for which pharmacy has been consulted for cefepime dosing for UTI.  Patient with a history of HTN, GIB, BPH, CVA, metastatic prostate cancer. Patient presenting with AMS and back pain.  SCr 1.24 WBC 9.8; T 97.5; HR 76; RR 22  Plan: Cefepime 2g q12hr  Monitor WBC, fever, renal function, cultures De-escalate when able  Height: 5\' 3"  (160 cm) Weight: 72 kg (158 lb 11.7 oz) IBW/kg (Calculated) : 56.9  Temp (24hrs), Avg:97.6 F (36.4 C), Min:97.5 F (36.4 C), Max:97.6 F (36.4 C)  Recent Labs  Lab 06/10/23 1327  WBC 9.8  CREATININE 1.24    Estimated Creatinine Clearance: 35.2 mL/min (by C-G formula based on SCr of 1.24 mg/dL).    Allergies  Allergen Reactions   Bee Venom Swelling   Microbiology results: Pending  Thank you for allowing pharmacy to be a part of this patient's care.  Delmar Landau, PharmD, BCPS 06/10/2023 6:33 PM ED Clinical Pharmacist -  [phone]

## 2023-06-10 NOTE — Assessment & Plan Note (Signed)
-  chronic avoid nephrotoxic medications such as NSAIDs, Vanco Zosyn combo,  avoid hypotension, continue to follow renal function  

## 2023-06-10 NOTE — Assessment & Plan Note (Signed)
Continue on aspirin 81 mg po q day

## 2023-06-10 NOTE — ED Provider Notes (Signed)
EMERGENCY DEPARTMENT AT [facility] Provider Note   CSN: [id number] Arrival date & time: 06/10/23  1223     History {Add pertinent medical, surgical, social history, OB history to HPI:1} Chief Complaint  Patient presents with   Altered Mental Status   Back Pain    Steven Cain is a 89 y.o. male.  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 He has a pmhx significant for depression, htn, gi bleed, bph, CVA, gerd, and metastatic prostate cancer.  He is brought in by his family because he's been more confused for the last few days.  He is also having more back pain.        Home Medications Prior to Admission medications   Medication Sig Start Date End Date Taking? Authorizing Provider  abiraterone acetate (ZYTIGA) 250 MG tablet Take 1,000 mg by mouth daily. 01/02/22   [provider]  acetaminophen (TYLENOL) 500 MG tablet Take 1 tablet (500 mg total) by mouth every 6 (six) hours as needed for mild pain. Patient taking differently: Take 1,000 mg by mouth every 6 (six) hours as needed for mild pain. 02/19/20   Juliet Rude, PA-C  albuterol (VENTOLIN HFA) 108 (90 Base) MCG/ACT inhaler Inhale 2 puffs into the lungs every 6 (six) hours as needed for wheezing or shortness of breath.    [provider]  Ascorbic Acid (VITAMIN C) 1000 MG tablet Take 1,000 mg by mouth daily.    [provider]  aspirin EC 81 MG tablet Take 81 mg by mouth daily.    [provider]  Camphor-Eucalyptus-Menthol (VICKS VAPORUB EX) Apply 1 Application topically daily as needed (congestion).    [provider]  cyanocobalamin (,VITAMIN B-12,) 1000 MCG/ML injection Inject 1,000 mcg into the muscle every 30 (thirty) days.    [provider]  Denosumab (XGEVA [place]) Inject 1 Dose into the skin every 28 (twenty-eight) days.    [provider]  diclofenac Sodium (VOLTAREN) 1 % GEL Apply 1 Application topically daily as needed (pain).    [provider]  hydroxypropyl methylcellulose / hypromellose (ISOPTO TEARS / GONIOVISC) 2.5 % ophthalmic solution Place 1 drop into both eyes daily as needed for dry eyes.    [provider]  leuprolide, 6 Month, (ELIGARD) 45 MG injection Inject 45 mg into the skin every 6 (six) months.    [provider]  Magnesium 250 MG TABS Take 250 mg by mouth daily.    [provider]  meclizine (ANTIVERT) 25 MG tablet Take 25 mg by mouth See admin instructions. Take 25 mg daily, may take a second 25 mg dose as needed for dizziness    [provider]  melatonin 3 MG TABS tablet Take 3 mg by mouth at bedtime.    [provider]  Misc Natural Products (NEURIVA PO) Take 1 capsule by mouth daily.    [provider]  Multiple Vitamin (MULTIVITAMIN WITH MINERALS) TABS tablet Take 1 tablet by mouth every morning.    [provider]  Omega-3 Fatty Acids (FISH OIL) 1200 MG CAPS Take 1,200 mg by mouth daily.    [provider]  OVER THE COUNTER MEDICATION Take 2 capsules by mouth daily. Dynamic brain otc supplement    [provider]  oxybutynin (DITROPAN) 5 MG tablet Take 5 mg by mouth 2 (two) times daily. 07/13/22   [provider]  oxyCODONE (ROXICODONE) 5 MG immediate release tablet Take 1 tablet (5 mg total)  by mouth every 8 (eight) hours as needed. For post-op pain and cancer pain 09/06/22 09/06/23  Loletta Parish., MD  PARoxetine (PAXIL) 40 MG tablet Take 1 tablet (40 mg total) by mouth daily. Patient taking differently: Take 40 mg by mouth at bedtime. 06/13/18   Danford, Earl Lites, MD  predniSONE (DELTASONE) 5 MG tablet Take 5 mg by mouth daily. 01/02/22   [provider]  senna-docusate (SENOKOT-S) 8.6-50 MG tablet Take 1 tablet by mouth at bedtime. Patient taking differently: Take 2 tablets by mouth 2 (two) times daily. 06/13/18   Danford, Earl Lites, MD  traZODone (DESYREL) 50 MG tablet Take 50 mg by  mouth at bedtime. 01/19/20   [provider]  vitamin E 180 MG (400 UNITS) capsule Take 400 Units by mouth every Wednesday.    [provider]  Zinc 50 MG TABS Take 50 mg by mouth daily.    [provider]      Allergies    Bee venom    Review of Systems   Review of Systems  Neurological:  Positive for weakness.  All other systems reviewed and are negative.   Physical Exam Updated Vital Signs BP (!) 172/91   Pulse 82   Temp (!) 97.5 F (36.4 C) (Oral)   Resp 17   Ht 5\' 3"  (1.6 m)   Wt 72 kg   SpO2 97%   BMI 28.12 kg/m  Physical Exam Vitals and nursing note reviewed.  Constitutional:      Appearance: Normal appearance.  HENT:     Head: Normocephalic and atraumatic.     Right Ear: External ear normal.     Left Ear: External ear normal.     Nose: Nose normal.     Mouth/Throat:     Mouth: Mucous membranes are moist.     Pharynx: Oropharynx is clear.  Eyes:     Extraocular Movements: Extraocular movements intact.     Conjunctiva/sclera: Conjunctivae normal.     Pupils: Pupils are equal, round, and reactive to light.  Cardiovascular:     Rate and Rhythm: Normal rate and regular rhythm.     Pulses: Normal pulses.     Heart sounds: Normal heart sounds.  Pulmonary:     Effort: Pulmonary effort is normal.     Breath sounds: Normal breath sounds.  Abdominal:     General: Abdomen is flat. Bowel sounds are normal.     Palpations: Abdomen is soft.  Musculoskeletal:        General: Normal range of motion.     Cervical back: Normal range of motion.  Skin:    General: Skin is warm.     Capillary Refill: Capillary refill takes less than 2 seconds.  Neurological:     Mental Status: He is alert.     Comments: Pt is oriented to person and knows he's in a hospital.  He is unclear on date.  He is moving all 4 extremities.  He is following commands.     ED Results / Procedures / Treatments   Labs (all labs ordered are listed, but only abnormal  results are displayed) Labs Reviewed  COMPREHENSIVE METABOLIC PANEL - Abnormal; Notable for the following components:      Result Value   Sodium 127 (*)    Chloride 90 (*)    Glucose, Bld 114 (*)    Calcium 8.7 (*)    Albumin 3.3 (*)    Alkaline Phosphatase 238 (*)  GFR, Estimated 55 (*)    All other components within normal limits  CBC - Abnormal; Notable for the following components:   RBC 4.20 (*)    Hemoglobin 12.5 (*)    HCT 37.5 (*)    All other components within normal limits  URINALYSIS, ROUTINE W REFLEX MICROSCOPIC  CBG MONITORING, ED    EKG EKG Interpretation Date/Time:  Sunday June 10 2023 12:51:58 EDT Ventricular Rate:  82 PR Interval:  352 QRS Duration:  112 QT Interval:  351 QTC Calculation: 410 R Axis:   -59  Text Interpretation: Sinus rhythm Prolonged PR interval Left anterior fascicular block Left ventricular hypertrophy Anterior Q waves, possibly due to LVH No significant change since last tracing Confirmed by Jacalyn Lefevre [phone]) on 06/10/2023 2:14:09 PM  Radiology DG Chest 2 View  Result Date: 06/10/2023 CLINICAL DATA:  Altered mental status EXAM: CHEST - 2 VIEW COMPARISON:  October 05, 2020 FINDINGS: The cardiomediastinal silhouette is unchanged in contour. No pleural effusion. No pneumothorax. Revisualization of bibasilar coarse reticular opacities, overall increased since 2022. More confluent heterogeneous opacity at the RIGHT lung base compared to priors. 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 Gaseous distension of bowel beneath the LEFT hemidiaphragm. IMPRESSION: 1. Increased bibasilar coarse reticular opacities with more confluent heterogeneous opacity at the RIGHT lung base. Findings are favored to reflect a combination of atelectasis and fibrosis. Superimposed infection could appear similar. Electronically Signed   By: Meda Klinefelter M.D.   On: 06/10/2023 13:53   DG Lumbar Spine Complete  Result Date:  06/10/2023 CLINICAL DATA:  pain EXAM: LUMBAR SPINE - COMPLETE 4+ VIEW COMPARISON:  February 03, 2022, PET scan dated Jan 02, 2023 FINDINGS: There are five non-rib bearing lumbar-type vertebral bodies. Straightening of the lumbar lordosis. Trace anterolisthesis of L3-4. Posterior disc osteophyte complexes throughout the lumbar spine. There is no evidence for acute fracture or subluxation. Revisualization of multiple sclerotic foci most confluent at the T11 vertebral body consistent with known osseous metastatic disease. Increased conspicuity of sclerosis of the T10 vertebral body since 2023, but grossly similar since most recent PET. Multiple sclerotic foci in the pelvis, most confluent in the LEFT pelvis. This is grossly similar to prior PET scan but increased since 2023. Severe intervertebral disc space height loss with severe facet arthropathy and multilevel endplate proliferative changes throughout the lumbar spine. LEFT-sided double-J stent in expected position over the LEFT renal collecting system. Sacrum is obscured by overlapping bowel contents and wires. Status post LEFT hip arthroplasty with similar lucency abutting the acetabular component. Severe degenerative changes of the RIGHT hip. Atherosclerotic calcifications. IMPRESSION: 1. No definitive acute fracture or subluxation. 2. Severe degenerative changes of the lumbar spine. 3. Revisualization of known osseous metastatic disease. Electronically Signed   By: Meda Klinefelter M.D.   On: 06/10/2023 13:49    Procedures Procedures  {Document cardiac monitor, telemetry assessment procedure when appropriate:1}  Medications Ordered in ED Medications  sodium chloride 0.9 % bolus 1,000 mL (1,000 mLs Intravenous New Bag/Given 06/10/23 1447)    ED Course/ Medical Decision Making/ A&P   {   Click here for ABCD2, HEART and other calculatorsREFRESH Note before signing :1}                              Medical Decision Making Amount and/or Complexity of  Data Reviewed Labs: ordered. Radiology: ordered.   This patient presents to the ED for concern of confusion,  this involves an extensive number of treatment options, and is a complaint that carries with it a high risk of complications and morbidity.  The differential diagnosis includes infection, electrolyte abn, worsening of cancer   Co morbidities that complicate the patient evaluation  depression, htn, gi bleed, bph, CVA, gerd, and metastatic prostate cancer.    Additional history obtained:  Additional history obtained from epic chart review External records from outside source obtained and reviewed including family   Lab Tests:  I Ordered, and personally interpreted labs.  The pertinent results include:  cbc with wbc nl, hgb 12.5; cmp with na sl low at 127   Imaging Studies ordered:  I ordered imaging studies including cxr/lumbar  I independently visualized and interpreted imaging which showed  CXR:  Increased bibasilar coarse reticular opacities with more  confluent heterogeneous opacity at the RIGHT lung base. Findings are  favored to reflect a combination of atelectasis and fibrosis.  Superimposed infection could appear similar.  Lumbar spine: No definitive acute fracture or subluxation.  2. Severe degenerative changes of the lumbar spine.  3. Revisualization of known osseous metastatic disease.   I agree with the radiologist interpretation   Cardiac Monitoring:  The patient was maintained on a cardiac monitor.  I personally viewed and interpreted the cardiac monitored which showed an underlying rhythm of: nsr   Medicines ordered and prescription drug management:  I ordered medication including ivfs  for sx  Reevaluation of the patient after these medicines showed that the patient improved I have reviewed the patients home medicines and have made adjustments as needed   Test Considered:  xr   Critical Interventions:  ivfs  Problem List / ED  Course:  Hyponatremia:  NS given   Reevaluation:  After the interventions noted above, I reevaluated the patient and found that they have :improved   Social Determinants of Health:  Lives at home   Dispostion:  After consideration of the diagnostic results and the patients response to treatment, I feel that the patent would benefit from discharge with outpatient f/u.    {Document critical care time when appropriate:1} {Document review of labs and clinical decision tools ie heart score, Chads2Vasc2 etc:1}  {Document your independent review of radiology images, and any outside records:1} {Document your discussion with family members, caretakers, and with consultants:1} {Document social determinants of health affecting pt's care:1} {Document your decision making why or why not admission, treatments were needed:1} Final Clinical Impression(s) / ED Diagnoses Final diagnoses:  Prostate cancer metastatic to multiple sites [facility])  Hyponatremia    Rx / DC Orders ED Discharge Orders     None

## 2023-06-10 NOTE — H&P (Signed)
Steven Cain KGM:[id number] DOB: 1931-01-28 DOA: 06/10/2023     PCP: [name], MD   Outpatient Specialists:    Oncology   Dr. Mosetta Putt  Cardiology Dr.  Wonda Amis  No care team member to display Urology Dr. Berneice Heinrich  Patient arrived to ER on 06/10/23 at 1223 Referred by Attending Therisa Doyne, MD Patient coming from:    home Lives With family    Chief Complaint:   Chief Complaint  Patient presents with   Altered Mental Status   Back Pain    HPI: Steven Cain is a 89 y.o. male with medical history significant of depression, htn, gi bleed, bph, CVA, gerd, and metastatic prostate cancer.     Presented with   confusion  He is brought in by his family because he's been more confused for the last 5 days.  He is also having more back pain. They suspected he had a UTI had prior hx of Psuedomonas treated him at home with Cipro but he is still confused  No head injury   Had 4 days of Cipro Has been having chronic back pain  And neuropathy  He took robaxin this AM  Hx of metastatic prostate cancer no longer on active treatment   He does take prednisone 5 mg a day  Denies significant ETOH intake   Does not smoke   Lab Results  Component Value Date   SARSCOV2NAA NEGATIVE 10/05/2020   SARSCOV2NAA NEGATIVE 02/15/2020   SARSCOV2NAA NEGATIVE 01/13/2020   SARSCOV2NAA NOT DETECTED 01/28/2019    Regarding pertinent Chronic problems:      last echo  Recent Results (from the past 64403 hour(s))  ECHOCARDIOGRAM COMPLETE   Collection Time: 10/06/20 10:23 AM  Result Value   Weight 3,040.58   Height 65   BP 129/51   S' Lateral 2.70   Area-P 1/2 2.87   Narrative      ECHOCARDIOGRAM REPORT        1. Left ventricular ejection fraction, by estimation, is 55 to 60%. The left ventricle has normal function. The left ventricle has no regional wall motion abnormalities. Diastolic function is not assessed due to AV block.  2. Right ventricular systolic function is low normal. The  right ventricular size is normal. There is moderately elevated pulmonary artery systolic pressure. The estimated right ventricular systolic pressure is 50.9 mmHg.  3. The mitral valve is grossly normal. Trivial mitral valve regurgitation. No evidence of mitral stenosis.  4. The aortic valve is tricuspid. Aortic valve regurgitation is trivial. Mild aortic valve sclerosis is present, with no evidence of aortic valve stenosis.  5. Aortic Normal proximal ascending aorta. There is borderline dilatation of the aortic root, measuring 39 mm.  6. The inferior vena cava is normal in size with greater than 50% respiratory variability, suggesting right atrial pressure of 3 mmHg.           COPD - not on oxygen   Hx of CVA -  on aspirin 81 mg aday    CKD stage IIIa  baseline Cr  1.3 Estimated Creatinine Clearance: 35.2 mL/min (by C-G formula based on SCr of 1.24 mg/dL).  Lab Results  Component Value Date   CREATININE 1.24 06/10/2023   CREATININE 1.27 (H) 03/26/2023   CREATININE 1.27 (H) 01/26/2023   Lab Results  Component Value Date   NA 127 (L) 06/10/2023   CL 90 (L) 06/10/2023   K 4.1 06/10/2023   CO2 24 06/10/2023   BUN 16 06/10/2023  CREATININE 1.24 06/10/2023   GFRNONAA 55 (L) 06/10/2023   CALCIUM 8.7 (L) 06/10/2023   PHOS 3.9 10/10/2020   ALBUMIN 3.3 (L) 06/10/2023   GLUCOSE 114 (H) 06/10/2023       Dementia - not on any meds    Chronic anemia - baseline hg Hemoglobin & Hematocrit  Recent Labs    01/26/23 1336 03/26/23 1043 06/10/23 1327  HGB 13.1 13.2 12.5*    Cancer: prostate cancer     While in ER: Clinical Course as of 06/10/23 2239  Sun Jun 10, 2023  1522 Hx metastatic prostate cancer, end stage, confused, waiting on urine, getting fluids for Na, didn't want head scan, follow up urine and DC [JD]  1812 Pharmacy: could try fosfomycin 3 g x 3 q3 days [JD]  1919 BMI (Calculated): 28.13 [JD]    Clinical Course User Index [JD] Laurence Spates, MD     Lab Orders          Urine Culture         Comprehensive metabolic panel         CBC         Urinalysis, Routine w reflex microscopic -Urine, Clean Catch         Urinalysis, Microscopic (reflex)         CBG monitoring, ED      CT HEAD   NON acute    CXR - . Increased bibasilar coarse reticular opacities with more confluent heterogeneous opacity at the RIGHT lung base. Findings are favored to reflect a combination of atelectasis and fibrosis. Superimposed infection could appear similar.  CTabd/pelvis - . Diffuse sclerotic bony metastatic disease within the visualized portions of the thoracolumbar spine, thoracic cage, and bony pelvis. Similar appearance and distribution to recent PET scan 01/12/2023.    Interval development of innumerable hypodense masses throughout the liver, consistent with hepatic metastases in this patient with a known history of metastatic prostate cancer.  Chronic left renal atrophy, with stable appearance of the indwelling left ureteral stent.   Following Medications were ordered in ER: Medications  ceFEPIme (MAXIPIME) 2 g in sodium chloride 0.9 % 100 mL IVPB (0 g Intravenous Stopped 06/10/23 2039)  sodium chloride 0.9 % bolus 1,000 mL (0 mLs Intravenous Stopped 06/10/23 1653)  iohexol (OMNIPAQUE) 300 MG/ML solution 100 mL (75 mLs Intravenous Contrast Given 06/10/23 1957)       ED Triage Vitals  Encounter Vitals Group     BP 06/10/23 1230 (!) 160/94     Systolic BP Percentile --      Diastolic BP Percentile --      Pulse Rate 06/10/23 1230 84     Resp 06/10/23 1230 18     Temp 06/10/23 1230 (!) 97.5 F (36.4 C)     Temp Source 06/10/23 1230 Oral     SpO2 06/10/23 1230 95 %     Weight 06/10/23 1229 158 lb 11.7 oz (72 kg)     Height 06/10/23 1229 5\' 3"  (1.6 m)     Head Circumference --      Peak Flow --      Pain Score 06/10/23 1229 0     Pain Loc --      Pain Education --      Exclude from Growth Chart --   ZOXW(96)@      _________________________________________ Significant initial  Findings: Abnormal Labs Reviewed  COMPREHENSIVE METABOLIC PANEL - Abnormal; Notable for the following components:      Result Value  Sodium 127 (*)    Chloride 90 (*)    Glucose, Bld 114 (*)    Calcium 8.7 (*)    Albumin 3.3 (*)    Alkaline Phosphatase 238 (*)    GFR, Estimated 55 (*)    All other components within normal limits  CBC - Abnormal; Notable for the following components:   RBC 4.20 (*)    Hemoglobin 12.5 (*)    HCT 37.5 (*)    All other components within normal limits  URINALYSIS, ROUTINE W REFLEX MICROSCOPIC - Abnormal; Notable for the following components:   APPearance CLOUDY (*)    Hgb urine dipstick LARGE (*)    Protein, ur 100 (*)    Leukocytes,Ua MODERATE (*)    All other components within normal limits  URINALYSIS, MICROSCOPIC (REFLEX) - Abnormal; Notable for the following components:   Bacteria, UA MANY (*)    All other components within normal limits    _________________________ Troponin ***ordered Cardiac Panel (last 3 results) No results for input(s): "CKTOTAL", "CKMB", "TROPONINIHS", "RELINDX" in the last 72 hours.   ECG: Ordered Personally reviewed and interpreted by me showing: HR : 82 Rhythm: Text Interpretation:Sinus rhythm Prolonged PR interval Left anterior fascicular block Left ventricular hypertrophy Anterior Q waves, possibly due to LVH  QTC 410    ____   The recent clinical data is shown below. Vitals:   06/10/23 1921 06/10/23 1930 06/10/23 1945 06/10/23 2200  BP: (!) 191/88 (!) 197/82 (!) 203/88 (!) 197/85  Pulse: 81 79 80 84  Resp: 20 16 (!) 24 20  Temp:    98.3 F (36.8 C)  TempSrc:    Oral  SpO2: 96% 96% 97% 94%  Weight:      Height:        WBC     Component Value Date/Time   WBC 9.8 06/10/2023 1327   LYMPHSABS 2.4 03/26/2023 1043   MONOABS 0.5 03/26/2023 1043   EOSABS 0.2 03/26/2023 1043   BASOSABS 0.1 03/26/2023 1043     Lactic Acid, Venous     Component Value Date/Time   LATICACIDVEN 1.3 02/15/2020 0916    Procalcitonin   Ordered      UA   evidence of UTI     Urine analysis:    Component Value Date/Time   COLORURINE YELLOW 06/10/2023 1653   APPEARANCEUR CLOUDY (A) 06/10/2023 1653   LABSPEC 1.020 06/10/2023 1653   PHURINE 5.5 06/10/2023 1653   GLUCOSEU NEGATIVE 06/10/2023 1653   HGBUR LARGE (A) 06/10/2023 1653   BILIRUBINUR NEGATIVE 06/10/2023 1653   KETONESUR NEGATIVE 06/10/2023 1653   PROTEINUR 100 (A) 06/10/2023 1653   UROBILINOGEN 0.2 05/06/2014 1815   NITRITE NEGATIVE 06/10/2023 1653   LEUKOCYTESUR MODERATE (A) 06/10/2023 1653    Culture, Urine     Status: Abnormal   Collection Time: 10/07/20 12:07 AM   Specimen: Urine, Random  Result Value Ref Range Status   Specimen Description URINE, RANDOM  Final   Special Requests   Final    NONE Performed at [facility], [address] [place], [place] [address]    Culture >=100,000 COLONIES/mL PSEUDOMONAS AERUGINOSA (A)  Final   Report Status 10/09/2020 FINAL  Final   Organism ID, Bacteria PSEUDOMONAS AERUGINOSA (A)  Final      Susceptibility   Pseudomonas aeruginosa - MIC*    CEFTAZIDIME 2 SENSITIVE Sensitive     CIPROFLOXACIN <=0.25 SENSITIVE Sensitive     GENTAMICIN <=1 SENSITIVE Sensitive     IMIPENEM <=0.25  SENSITIVE Sensitive     PIP/TAZO <=4 SENSITIVE Sensitive     CEFEPIME 1 SENSITIVE Sensitive     * >=100,000 COLONIES/mL PSEUDOMONAS AERUGINOSA    ABX started Antibiotics Given (last 72 hours)     Date/Time Action Medication Dose Rate   06/10/23 1845 New Bag/Given   ceFEPIme (MAXIPIME) 2 g in sodium chloride 0.9 % 100 mL IVPB 2 g 200 mL/hr       ____________________________________________________________   __________________________________________________________ Recent Labs  Lab 06/10/23 1327  NA 127*  K 4.1  CO2 24  GLUCOSE 114*  BUN 16  CREATININE 1.24  CALCIUM 8.7*    Cr   stable,    Lab Results  Component Value Date    CREATININE 1.24 06/10/2023   CREATININE 1.27 (H) 03/26/2023   CREATININE 1.27 (H) 01/26/2023    Recent Labs  Lab 06/10/23 1327  AST 38  ALT 11  ALKPHOS 238*  BILITOT 0.8  PROT 7.5  ALBUMIN 3.3*   Lab Results  Component Value Date   CALCIUM 8.7 (L) 06/10/2023   PHOS 3.9 10/10/2020    Plt: Lab Results  Component Value Date   PLT 278 06/10/2023       Recent Labs  Lab 06/10/23 1327  WBC 9.8  HGB 12.5*  HCT 37.5*  MCV 89.3  PLT 278    HG/HCT stable,      Component Value Date/Time   HGB 12.5 (L) 06/10/2023 1327   HGB 13.1 08/03/2022 1203   HCT 37.5 (L) 06/10/2023 1327   MCV 89.3 06/10/2023 1327     No results for input(s): "LIPASE", "AMYLASE" in the last 168 hours. No results for input(s): "AMMONIA" in the last 168 hours.    _____________________________________________ Hospitalist was called for admission for  UTI, acute encephalopathy Prostate cancer metastatic to multiple sites   Hyponatremia     The following Work up has been ordered so far:  Orders Placed This Encounter  Procedures   Urine Culture   DG Lumbar Spine Complete   DG Chest 2 View   CT ABDOMEN PELVIS W CONTRAST   CT L-SPINE NO CHARGE   CT Head Wo Contrast   Comprehensive metabolic panel   CBC   Urinalysis, Routine w reflex microscopic -Urine, Clean Catch   Urinalysis, Microscopic (reflex)   Diet NPO time specified   Document Height and Actual Weight   Neuro checks q 2 hours x12 hours   ED Cardiac monitoring   Cardiac Monitoring Continuous x 24 hours Indications for use: Other; other indications for use: acute encephalopathy   CeFEPIme (MAXIPIME) per pharmacy consult            Consult to hospitalist   CBG monitoring, ED   ED EKG   EKG 12-Lead   EKG   EKG   Insert peripheral IV   Saline lock IV   Place in observation (patient's expected length of stay will be less than 2 midnights)     OTHER Significant initial  Findings:  labs showing:          Cultures:     Component Value Date/Time   SDES URINE, RANDOM 10/07/2020 0007   SPECREQUEST  10/07/2020 0007    NONE Performed at [facility], [address] [place], [place] [address]    CULT >=100,000 COLONIES/mL PSEUDOMONAS AERUGINOSA (A) 10/07/2020 0007   REPTSTATUS 10/09/2020 FINAL 10/07/2020 0007     Radiological Exams on Admission: CT L-SPINE NO CHARGE  Result Date: 06/10/2023 CLINICAL DATA:  Metastatic prostate cancer, lower back pain, confusion EXAM: CT LUMBAR SPINE WITHOUT CONTRAST TECHNIQUE: Multidetector CT imaging of the lumbar spine was performed without intravenous contrast administration. Multiplanar CT image reconstructions were also generated. RADIATION DOSE REDUCTION: This exam was performed according to the departmental dose-optimization program which includes automated exposure control, adjustment of the mA and/or kV according to patient size and/or use of iterative reconstruction technique. COMPARISON:  01/12/2023 FINDINGS: Segmentation: 5 lumbar type vertebrae. Alignment: Mild degenerative anterolisthesis of L3 on L4. Otherwise alignment is anatomic. Vertebrae: Diffuse sclerotic bony metastases are again identified, similar in appearance to recent PET scan. Sclerotic metastatic disease is seen throughout the visualized thoracolumbar spine, thoracic cage, and bony pelvis. No evidence of pathologic fracture. Paraspinal and other soft tissues: Paraspinal soft tissues are grossly unremarkable. Please see concurrent CT abdomen and pelvis exam for intraperitoneal and retroperitoneal findings. Disc levels: There is extensive multilevel spondylosis and facet hypertrophy throughout the lumbar spine. Changes are most pronounced from L3-4 through L5-S1, with mild central canal stenosis and bilateral neural foraminal encroachment. Reconstructed images demonstrate no additional findings. IMPRESSION: 1. Diffuse sclerotic bony metastatic disease within the visualized portions of the thoracolumbar  spine, thoracic cage, and bony pelvis. Similar appearance and distribution to recent PET scan 01/12/2023. 2. No acute or pathologic fractures. 3. Extensive multilevel lumbar spondylosis and facet hypertrophy, unchanged since prior exam. Electronically Signed   By: Sharlet Salina M.D.   On: 06/10/2023 21:00   CT ABDOMEN PELVIS W CONTRAST  Result Date: 06/10/2023 CLINICAL DATA:  Abdominal pain, generalized weakness, lower back pain, known history of metastatic prostate cancer EXAM: CT ABDOMEN AND PELVIS WITH CONTRAST TECHNIQUE: Multidetector CT imaging of the abdomen and pelvis was performed using the standard protocol following bolus administration of intravenous contrast. RADIATION DOSE REDUCTION: This exam was performed according to the departmental dose-optimization program which includes automated exposure control, adjustment of the mA and/or kV according to patient size and/or use of iterative reconstruction technique. CONTRAST:  75mL OMNIPAQUE IOHEXOL 300 MG/ML  SOLN COMPARISON:  02/03/2022, 01/12/2023 FINDINGS: Lower chest: Trace bilateral pleural effusions, right greater than left. Bibasilar subpleural scarring and fibrosis again noted. No acute airspace disease. Hepatobiliary: Innumerable hypodense liver masses are identified, consistent with metastatic disease. Largest index lesion within the left lobe liver image 22/301 measures 3.4 cm. Gallbladder is unremarkable. No intrahepatic biliary duct dilation. Pancreas: Diffuse pancreatic atrophy. No focal abnormality or acute inflammatory change. Spleen: Normal in size without focal abnormality. Adrenals/Urinary Tract: Chronic left renal atrophy, with indwelling left ureteral stent extending from the left renal pelvis into the bladder lumen. Mild right renal atrophy without focal abnormality. The adrenals are stable. Evaluation of the bladder is limited by streak artifact from left hip arthroplasty. No evidence of filling defect. Stomach/Bowel: No bowel  obstruction or ileus. 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 No bowel wall thickening or acute inflammatory change. Vascular/Lymphatic: Aortic atherosclerosis again identified. There is high-grade stenosis within the right common iliac artery, estimated greater than 90%. 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 High-grade stenosis is also seen within the left common femoral artery just proximal to the bifurcation, also estimated at greater than 90%. No pathologic adenopathy within the abdomen or pelvis. Reproductive: Stable appearance of the prostate. Other: No free fluid or free intraperitoneal gas. No abdominal wall hernia. Musculoskeletal: Diffuse sclerotic metastatic disease is identified throughout the visualized spine, thoracic cage, and bony pelvis. Distribution  is similar to recent PET scan, with significant progression since the 2023 CT study. There are no acute displaced fractures. Unremarkable left hip arthroplasty. Reconstructed images demonstrate no additional findings. IMPRESSION: 1. Interval development of innumerable hypodense masses throughout the liver, consistent with hepatic metastases in this patient with a known history of metastatic prostate cancer. 2. Diffuse sclerotic bony metastases, with a similar appearance to recent PET scan. No acute fractures. 3. Trace bilateral pleural effusions, right greater than left. 4. Stable bibasilar subpleural scarring and fibrosis. No acute airspace disease. 5.  Aortic Atherosclerosis (ICD10-I70.0). 6. Chronic left renal atrophy, with stable appearance of the indwelling left ureteral stent. Electronically Signed   By: Sharlet Salina M.D.   On: 06/10/2023 20:57   CT Head Wo Contrast  Result Date: 06/10/2023 CLINICAL DATA:  Mental status change, unknown cause EXAM: CT HEAD WITHOUT CONTRAST TECHNIQUE: Contiguous axial images were  obtained from the base of the skull through the vertex without intravenous contrast. RADIATION DOSE REDUCTION: This exam was performed according to the departmental dose-optimization program which includes automated exposure control, adjustment of the mA and/or kV according to patient size and/or use of iterative reconstruction technique. COMPARISON:  MRI head 10/05/2020 FINDINGS: Brain: Cerebral ventricle sizes are concordant with the degree of cerebral volume loss. Patchy and confluent areas of decreased attenuation are noted throughout the deep and periventricular white matter of the cerebral hemispheres bilaterally, compatible with chronic microvascular ischemic disease. No evidence of large-territorial acute infarction. No parenchymal hemorrhage. No mass lesion. No extra-axial collection. No mass effect or midline shift. No hydrocephalus. Basilar cisterns are patent. Vascular: No hyperdense vessel. Atherosclerotic calcifications are present within the cavernous internal carotid and vertebral arteries. Skull: No acute fracture or focal lesion. Sinuses/Orbits: Bilateral maxillary sinus mucosal thickening. Paranasal sinuses and mastoid air cells are clear. The orbits are unremarkable. Other: None. IMPRESSION: No acute intracranial abnormality. Electronically Signed   By: Tish Frederickson M.D.   On: 06/10/2023 20:52   DG Chest 2 View  Result Date: 06/10/2023 CLINICAL DATA:  Altered mental status EXAM: CHEST - 2 VIEW COMPARISON:  October 05, 2020 FINDINGS: The cardiomediastinal silhouette is unchanged in contour. No pleural effusion. No pneumothorax. Revisualization of bibasilar coarse reticular opacities, overall increased since 2022. More confluent heterogeneous opacity at the RIGHT lung base compared to priors. 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 Gaseous distension of bowel beneath the LEFT hemidiaphragm. IMPRESSION: 1. Increased bibasilar coarse reticular opacities with more  confluent heterogeneous opacity at the RIGHT lung base. Findings are favored to reflect a combination of atelectasis and fibrosis. Superimposed infection could appear similar. Electronically Signed   By: Meda Klinefelter M.D.   On: 06/10/2023 13:53   DG Lumbar Spine Complete  Result Date: 06/10/2023 CLINICAL DATA:  pain EXAM: LUMBAR SPINE - COMPLETE 4+ VIEW COMPARISON:  February 03, 2022, PET scan dated Jan 02, 2023 FINDINGS: There are five non-rib bearing lumbar-type vertebral bodies. Straightening of the lumbar lordosis. Trace anterolisthesis of L3-4. Posterior disc osteophyte complexes throughout the lumbar spine. There is no evidence for acute fracture or subluxation. Revisualization of multiple sclerotic foci most confluent at the T11 vertebral body consistent with known osseous metastatic disease. Increased conspicuity of sclerosis of the T10 vertebral body since 2023, but grossly similar since most recent PET. Multiple sclerotic foci in the pelvis, most confluent in the LEFT pelvis. This is grossly similar to prior PET scan but increased since 2023. Severe intervertebral disc space height loss with severe facet arthropathy and  multilevel endplate proliferative changes throughout the lumbar spine. LEFT-sided double-J stent in expected position over the LEFT renal collecting system. Sacrum is obscured by overlapping bowel contents and wires. Status post LEFT hip arthroplasty with similar lucency abutting the acetabular component. Severe degenerative changes of the RIGHT hip. Atherosclerotic calcifications. IMPRESSION: 1. No definitive acute fracture or subluxation. 2. Severe degenerative changes of the lumbar spine. 3. Revisualization of known osseous metastatic disease. Electronically Signed   By: Meda Klinefelter M.D.   On: 06/10/2023 13:49   _______________________________________________________________________________________________________ Latest  Blood pressure (!) 197/85, pulse 84, temperature  98.3 F (36.8 C), temperature source Oral, resp. rate 20, height 5\' 3"  (1.6 m), weight 72 kg, SpO2 94%.   Vitals  labs and radiology finding personally reviewed  Review of Systems:    Pertinent positives include:   chills, fatigue,confusion  Constitutional:  No weight loss, night sweats, Fevers, weight loss  HEENT:  No headaches, Difficulty swallowing,Tooth/dental problems,Sore throat,  No sneezing, itching, ear ache, nasal congestion, post nasal drip,  Cardio-vascular:  No chest pain, Orthopnea, PND, anasarca, dizziness, palpitations.no Bilateral lower extremity swelling  GI:  No heartburn, indigestion, abdominal pain, nausea, vomiting, diarrhea, change in bowel habits, loss of appetite, melena, blood in stool, hematemesis Resp:  no shortness of breath at rest. No dyspnea on exertion, No excess mucus, no productive cough, No non-productive cough, No coughing up of blood.No change in color of mucus.No wheezing. Skin:  no rash or lesions. No jaundice GU:  no dysuria, change in color of urine, no urgency or frequency. No straining to urinate.  No flank pain.  Musculoskeletal:  No joint pain or no joint swelling. No decreased range of motion. No back pain.  Psych:  No change in mood or affect. No depression or anxiety. No memory loss.  Neuro: no localizing neurological complaints, no tingling, no weakness, no double vision, no gait abnormality, no slurred speech, no   All systems reviewed and apart from HOPI all are negative _______________________________________________________________________________________________ Past Medical History:   Past Medical History:  Diagnosis Date   Abnormal EKG    hx of left anterior fasicular block on ekg, no cardiologist   Anxiety    AR (aortic regurgitation) 03/23/2017   Mild, Noted on ECHO   Arthritis    DJD   Benign enlargement of prostate    frequent UTI, trim of prostate done , with some issues of incontinence now.   Cancer (HCC)     Chronic bronchitis (HCC)    chronic- usually has phelgm often   COVID-19 08/2019   fatigue and loss of taste and smell x 10 days did monoclonal antibody therapy   Depression    Fluid retention in legs    resolved   GERD (gastroesophageal reflux disease)    rare   GI bleed    Grade I diastolic dysfunction    Hard of hearing    hear more on left side- no hearing aid   History of colon polyps    History of transfusion 2016   Hydronephrosis 2018   Left ckd stage 3 no nephrologist per wife adele   Hypertension    Moderate concentric left ventricular hypertrophy 03/23/2017   Noted on ECHO   Nausea & vomiting 06/08/2018   Perforated, severe stomach ulcer (HCC) 1974   Peripheral neuropathy    feet and toes   Pneumonia    Prostate cancer metastatic to bone [facility])    Spinal stenosis    Stroke (HCC)    cerebellalr  stroke   TR (tricuspid regurgitation) 03/23/2017   Mild, Noted on ECHO   Transfusion history    none recent   Ureteral stricture    CHRONIC   Urinary incontinence    UTI (urinary tract infection)    frequent   Wears glasses      Past Surgical History:  Procedure Laterality Date   ABDOMINAL SURGERY     bilroths tube '74   APPENDECTOMY     COLONOSCOPY N/A 09/29/2014   Procedure: COLONOSCOPY;  Surgeon: Theda Belfast, MD;  Location: [facility];  Service: Endoscopy;  Laterality: N/A;   CYSTOSCOPY W/ URETERAL STENT PLACEMENT Left 05/06/2014   Procedure: CYSTOSCOPY WITH RETROGRADE PYELOGRAM/URETERAL STENT PLACEMENT;  Surgeon: Sebastian Ache, MD;  Location: [facility];  Service: Urology;  Laterality: Left;   CYSTOSCOPY W/ URETERAL STENT PLACEMENT Left 03/31/2015   Procedure: CYSTOSCOPY WITH LEFT RETROGRADE PYELOGRAM/URETERAL STENT EXCHANGE;  Surgeon: Sebastian Ache, MD;  Location: [facility];  Service: Urology;  Laterality: Left;   CYSTOSCOPY W/ URETERAL STENT PLACEMENT Left 09/15/2015   Procedure: CYSTOSCOPY WITH LEFT RETROGRADE PYELOGRAM/URETERAL STENT EXCHANGE ;  Surgeon:  Sebastian Ache, MD;  Location: [facility];  Service: Urology;  Laterality: Left;   CYSTOSCOPY W/ URETERAL STENT PLACEMENT Left 04/28/2016   Procedure: CYSTOSCOPY WITH RETROGRADE PYELOGRAM/URETERAL STENT EXCHANGE;  Surgeon: Sebastian Ache, MD;  Location: [facility];  Service: Urology;  Laterality: Left;   CYSTOSCOPY W/ URETERAL STENT PLACEMENT Left 11/08/2016   Procedure: CYSTOSCOPY WITH RETROGRADE PYELOGRAM/URETERAL STENT EXCHANGE;  Surgeon: Sebastian Ache, MD;  Location: [facility];  Service: Urology;  Laterality: Left;   CYSTOSCOPY W/ URETERAL STENT PLACEMENT Left 09/14/2017   Procedure: CYSTOSCOPY WITH RETROGRADE PYELOGRAM/URETERAL STENT PLACEMENT;  Surgeon: Sebastian Ache, MD;  Location: [facility];  Service: Urology;  Laterality: Left;   CYSTOSCOPY W/ URETERAL STENT PLACEMENT Left 04/10/2018   Procedure: CYSTOSCOPY WITH LEFT RETROGRADE URETERAL STENT EXCHANGE;  Surgeon: Sebastian Ache, MD;  Location[facility];  Service: Urology;  Laterality: Left;   CYSTOSCOPY W/ URETERAL STENT PLACEMENT Left 01/31/2019   Procedure: CYSTOSCOPY WITH RETROGRADE PYELOGRAM/URETERAL STENT PLACEMENT;  Surgeon: Sebastian Ache, MD;  Location: [facility];  Service: Urology;  Laterality: Left;  45 MINS   CYSTOSCOPY W/ URETERAL STENT PLACEMENT Left 01/16/2020   Procedure: CYSTOSCOPY WITH RETROGRADE PYELOGRAM/URETERAL STENT EXCHANGED;  Surgeon: Sebastian Ache, MD;  Location: [facility];  Service: Urology;  Laterality: Left;   CYSTOSCOPY W/ URETERAL STENT PLACEMENT Left 04/29/2021   Procedure: CYSTOSCOPY WITH RETROGRADE PYELOGRAM/URETERAL STENT EXCHANGE;  Surgeon: Sebastian Ache, MD;  Location: [facility];  Service: Urology;  Laterality: Left;   CYSTOSCOPY W/ URETERAL STENT PLACEMENT Left 09/06/2022   Procedure: CYSTOSCOPY WITH RETROGRADE PYELOGRAM/URETERAL STENT EXCHANGE WITH LITHOLAPAXY bladder;  Surgeon: Sebastian Ache, MD;  Location: [facility];  Service: Urology;  Laterality: Left;  1 HR   CYSTOSCOPY WITH  RETROGRADE PYELOGRAM, URETEROSCOPY AND STENT PLACEMENT Left 10/21/2014   Procedure: CYSTOSCOPY WITH RETROGRADE PYELOGRAM, URETEROSCOPY,BIOPSY AND STENT CHANGE;  Surgeon: Sebastian Ache, MD;  Location: [facility];  Service: Urology;  Laterality: Left;   CYSTOSCOPY/RETROGRADE/URETEROSCOPY Left 06/03/2014   Procedure: CYSTOSCOPY/RETROGRADE/URETEROSCOPY WITH BIOPSY,STENT EXCHANGE;  Surgeon: Sebastian Ache, MD;  Location: [facility];  Service: Urology;  Laterality: Left;   ESOPHAGOGASTRODUODENOSCOPY N/A 09/28/2014   Procedure: ESOPHAGOGASTRODUODENOSCOPY (EGD);  Surgeon: Theda Belfast, MD;  Location: [facility];  Service: Endoscopy;  Laterality: N/A;   GASTRIC ROUX-EN-Y     '74   HERNIA REPAIR     bilateral hernia repairs    IR ANGIO  INTRA EXTRACRAN SEL COM CAROTID INNOMINATE BILAT MOD SED  01/20/2021   IR ANGIO VERTEBRAL SEL SUBCLAVIAN INNOMINATE BILAT MOD SED  01/20/2021   IR US GUIDE VASC ACCESS RIGHT  01/20/2021   LAPAROSCOPIC APPENDECTOMY N/A 02/15/2020   Procedure: APPENDECTOMY LAPAROSCOPIC;  Surgeon: Griselda Miner, MD;  Location: [facility];  Service: General;  Laterality: N/A;   TONSILLECTOMY  as child   TOTAL HIP ARTHROPLASTY Left 05/19/2016   Procedure: LEFT TOTAL HIP ARTHROPLASTY ANTERIOR APPROACH;  Surgeon: Kathryne Hitch, MD;  Location: [facility];  Service: Orthopedics;  Laterality: Left;   TRANSURETHRAL RESECTION OF PROSTATE     TRANSURETHRAL RESECTION OF PROSTATE N/A 09/06/2022   Procedure: REPEAT TRANSURETHRAL RESECTION OF THE PROSTATE (TURP);  Surgeon: Sebastian Ache, MD;  Location: [facility];  Service: Urology;  Laterality: N/A;   VAGOTOMY  1950's    Social History:  Ambulatory  wheelchair bound, with assist    reports that he has quit smoking. His smoking use included cigarettes. He has quit using smokeless tobacco.  His smokeless tobacco use included chew. He reports that he does not currently use alcohol. He reports that he does not use drugs.    Family History:   Family  History  Problem Relation Age of Onset   Colon cancer Mother 53 - 19   Breast cancer Mother 65 - 14   Prostate cancer Father 7   Thyroid cancer Brother 74       medullary   Multiple myeloma Maternal Aunt 40 - 79   Breast cancer Maternal Aunt 36 - 49   Colon cancer Maternal Aunt    Pancreatic cancer Maternal Uncle 38   Breast cancer Daughter 78   Breast cancer Cousin 92 - 18       maternal first cousin   Breast cancer Cousin 49 - 72       maternal first cousin   Thyroid cancer Cousin 6 - 49   Esophageal cancer Cousin 14 - 62       maternal first cousin   Heart disease Neg Hx    ______________________________________________________________________________________________ Allergies: Allergies  Allergen Reactions   Bee Venom Swelling     Prior to Admission medications   Medication Sig Start Date End Date Taking? Authorizing Provider  abiraterone acetate (ZYTIGA) 250 MG tablet Take 1,000 mg by mouth daily. 01/02/22   [provider]  acetaminophen (TYLENOL) 500 MG tablet Take 1 tablet (500 mg total) by mouth every 6 (six) hours as needed for mild pain. Patient taking differently: Take 1,000 mg by mouth every 6 (six) hours as needed for mild pain. 02/19/20   Juliet Rude, PA-C  albuterol (VENTOLIN HFA) 108 (90 Base) MCG/ACT inhaler Inhale 2 puffs into the lungs every 6 (six) hours as needed for wheezing or shortness of breath.    [provider]  Ascorbic Acid (VITAMIN C) 1000 MG tablet Take 1,000 mg by mouth daily.    [provider]  aspirin EC 81 MG tablet Take 81 mg by mouth daily.    [provider]  Camphor-Eucalyptus-Menthol (VICKS VAPORUB EX) Apply 1 Application topically daily as needed (congestion).    [provider]  cyanocobalamin (,VITAMIN B-12,) 1000 MCG/ML injection Inject 1,000 mcg into the muscle every 30 (thirty) days.    [provider]  Denosumab (XGEVA [place]) Inject 1 Dose into the skin every 28  (twenty-eight) days.    [provider]  diclofenac Sodium (VOLTAREN) 1 % GEL Apply 1 Application topically daily  as needed (pain).    [provider]  hydroxypropyl methylcellulose / hypromellose (ISOPTO TEARS / GONIOVISC) 2.5 % ophthalmic solution Place 1 drop into both eyes daily as needed for dry eyes.    [provider]  leuprolide, 6 Month, (ELIGARD) 45 MG injection Inject 45 mg into the skin every 6 (six) months.    [provider]  Magnesium 250 MG TABS Take 250 mg by mouth daily.    [provider]  meclizine (ANTIVERT) 25 MG tablet Take 25 mg by mouth See admin instructions. Take 25 mg daily, may take a second 25 mg dose as needed for dizziness    [provider]  melatonin 3 MG TABS tablet Take 3 mg by mouth at bedtime.    [provider]  Misc Natural Products (NEURIVA PO) Take 1 capsule by mouth daily.    [provider]  Multiple Vitamin (MULTIVITAMIN WITH MINERALS) TABS tablet Take 1 tablet by mouth every morning.    [provider]  Omega-3 Fatty Acids (FISH OIL) 1200 MG CAPS Take 1,200 mg by mouth daily.    [provider]  OVER THE COUNTER MEDICATION Take 2 capsules by mouth daily. Dynamic brain otc supplement    [provider]  oxybutynin (DITROPAN) 5 MG tablet Take 5 mg by mouth 2 (two) times daily. 07/13/22   [provider]  oxyCODONE (ROXICODONE) 5 MG immediate release tablet Take 1 tablet (5 mg total) by mouth every 8 (eight) hours as needed. For post-op pain and cancer pain 09/06/22 09/06/23  Loletta Parish., MD  PARoxetine (PAXIL) 40 MG tablet Take 1 tablet (40 mg total) by mouth daily. Patient taking differently: Take 40 mg by mouth at bedtime. 06/13/18   Danford, Earl Lites, MD  predniSONE (DELTASONE) 5 MG tablet Take 5 mg by mouth daily. 01/02/22   [provider]  senna-docusate (SENOKOT-S) 8.6-50 MG tablet Take 1 tablet by mouth at  bedtime. Patient taking differently: Take 2 tablets by mouth 2 (two) times daily. 06/13/18   Danford, Earl Lites, MD  traZODone (DESYREL) 50 MG tablet Take 50 mg by mouth at bedtime. 01/19/20   [provider]  vitamin E 180 MG (400 UNITS) capsule Take 400 Units by mouth every Wednesday.    [provider]  Zinc 50 MG TABS Take 50 mg by mouth daily.    [provider]    ___________________________________________________________________________________________________ Physical Exam:    06/10/2023   10:00 PM 06/10/2023    7:45 PM 06/10/2023    7:30 PM  Vitals with BMI  Systolic 197 203 657  Diastolic 85 88 82  Pulse 84 80 79     1. General:  in No  Acute distress   Chronically ill   -appearing 2. Psychological: Alert and *** Oriented 3. Head/ENT:     Dry Mucous Membranes                          Head Non traumatic, neck supple                         Poor Dentition 4. SKIN: decreased Skin turgor,  Skin clean Dry and intact no rash    5. Heart: Regular rate and rhythm no*** Murmur, no Rub or gallop 6. Lungs:  no wheezes or crackles   7. Abdomen: Soft, ***non-tender, Non distended bowel sounds present 8. Lower extremities: no clubbing, cyanosis,  no  edema 9. Neurologically Grossly intact, moving all 4 extremities equally    10. MSK: Normal range of motion    Chart has been reviewed  ______________________________________________________________________________________________  Assessment/Plan 89 y.o. male with medical history significant of depression, htn, gi bleed, bph, CVA, gerd, and metastatic prostate cancer.   Admitted for  UTI, acute encephalopathy Prostate cancer metastatic to multiple sites   Hyponatremia      Present on Admission:  Acute encephalopathy  Prostate cancer metastatic to bone (HCC)  Chronic bronchitis (HCC)  UTI (urinary tract infection)  Chronic kidney disease, stage 3b (HCC)  Essential hypertension  Anemia  associated with acute blood loss  Hyponatremia   Prostate cancer metastatic to bone (HCC) Chronic followed by URology  Hold Zytiga Contiune prednisone 5mg  at daily dose  Chronic bronchitis (HCC) Chronic stable continue home meds  Acute encephalopathy   - most likely multifactorial secondary to combination of  infection  dehydration secondary to decreased by mouth intake,  polypharmacy   - Will rehydrate   - treat underlining infection   - Hold contributing medications   - if no improvement may need further imaging to evaluate for CNS pathology pathology such as MRI of the brain to eval for new mets, suspect acute encephalopathy in the setting of UTI will improve with treatment  - neurological exam appears to be nonfocal but patient unable to cooperate fully    - new liver mets ammonia ordered   UTI (urinary tract infection) Hx of pseudomonas continue on cefepime per pharmacy  Chronic kidney disease, stage 3b (HCC)  -chronic avoid nephrotoxic medications such as NSAIDs, Vanco Zosyn combo,  avoid hypotension, continue to follow renal function   Essential hypertension Allow permissive HTN  Pt has hx of vasovagal syncope on BP meds  Anemia associated with acute blood loss Obtain anemia panel  Transfuse for Hg <7 , rapidly dropping or  if symptomatic   Hyponatremia In the setting of decreased PO intake will gently rehydrate Obtain urine elctrolytes  History of CVA (cerebrovascular accident) Continue on aspirin 81 mg po q day   Other plan as per orders.  DVT prophylaxis:  SCD      Code Status:   DNR/DNI  as per  family  I had personally discussed CODE STATUS with patient and family  ACP   has been reviewed        Family Communication:   Family at  Bedside  plan of care was discussed  with   Daughter, Wife,    Diet  Diet Orders (From admission, onward)     Start     Ordered   06/10/23 1232  Diet NPO time specified  Diet effective now        06/10/23 1231             Disposition Plan:     To home once workup is complete and patient is stable   Following barriers for discharge:                                                         Electrolytes corrected                               Anemia corrected h/H stable  Pain controlled with PO medications                               Afebrile, white count improving able to transition to PO antibiotics                             Will need to be able to tolerate PO                            Will likely need home health, home O2, set up                           Will need consultants to evaluate patient prior to discharge       Consult Orders  (From admission, onward)           Start     Ordered   06/10/23 1823  Consult to hospitalist  Spoke with Selena Batten @ [facility] regarding hospitalist consult  Once       Provider:  (Not yet assigned)  Question Answer Comment  Place call to: [facility]   Reason for Consult Admit      06/10/23 1822                               Would benefit from PT/OT eval prior to DC  Ordered                                       Transition of care consulted                   Nutrition    consulted                                     Palliative care    consulted                                     Consults called: none   Admission status:  ED Disposition     ED Disposition  Admit   Condition  --   Comment  Hospital Area: [facility] [[id number]]  Level of Care: Telemetry [5]  Admit to tele based on following criteria: Monitor QTC interval  Interfacility transfer: Yes  May place patient in observation at [facility] or [facility] if equivalent level of care is available:: No  Covid Evaluation: Asymptomatic - no recent exposure (last 10 days) testing not required  Diagnosis: Acute encephalopathy [[id number]]  Admitting Physician: Therisa Doyne [[id number]]  Attending Physician: Therisa Doyne [[id number]]            Obs    Level of care     tele  For   24H      Lyonel Morejon 06/10/2023, 11:45 PM    [facility]     after 2 AM please page floor coverage PA If 7AM-7PM, please contact the day team taking care of the patient using [email or web address]

## 2023-06-10 NOTE — ED Triage Notes (Signed)
The patient is having is having some confusion since yesterday, generalized weakness, lower back pain.

## 2023-06-10 NOTE — Assessment & Plan Note (Signed)
Chronic stable continue home meds

## 2023-06-10 NOTE — Assessment & Plan Note (Signed)
Chronic followed by URology  Hold Zytiga Contiune prednisone 5mg  at daily dose

## 2023-06-10 NOTE — ED Notes (Signed)
..ED TO INPATIENT HANDOFF REPORT  ED Nurse Name and Phone #: Debbe Odea, RN  S Name/Age/Gender Steven Cain 89 y.o. male Room/Bed: MH06/MH06  Code Status   Code Status: Prior  Home/SNF/Other Home Patient oriented to: self, place, time, and situation but he is confused with other things; bout of confusion and says things that sometimes do not make sense. Is this baseline? No . Family reports he has episodes of confusion but not to this extent.   Triage Complete: Triage complete  Chief Complaint Acute encephalopathy [G93.40]  Triage Note The patient is having is having some confusion since yesterday, generalized weakness, lower back pain.    Allergies Allergies  Allergen Reactions   Bee Venom Swelling    Level of Care/Admitting Diagnosis ED Disposition     ED Disposition  Admit   Condition  --   Comment  Hospital Area: [facility] [[id number]]  Level of Care: Telemetry [5]  Admit to tele based on following criteria: Monitor QTC interval  Interfacility transfer: Yes  May place patient in observation at [facility] or [facility] if equivalent level of care is available:: No  Covid Evaluation: Asymptomatic - no recent exposure (last 10 days) testing not required  Diagnosis: Acute encephalopathy [604540]  Admitting Physician: Therisa Doyne [[id number]]  Attending Physician: Therisa Doyne [[id number]]          B Medical/Surgery History Past Medical History:  Diagnosis Date   Abnormal EKG    hx of left anterior fasicular block on ekg, no cardiologist   Anxiety    AR (aortic regurgitation) 03/23/2017   Mild, Noted on ECHO   Arthritis    DJD   Benign enlargement of prostate    frequent UTI, trim of prostate done , with some issues of incontinence now.   Cancer (HCC)    Chronic bronchitis (HCC)    chronic- usually has phelgm often   COVID-19 08/2019   fatigue and loss of taste and smell x 10 days did monoclonal antibody therapy    Depression    Fluid retention in legs    resolved   GERD (gastroesophageal reflux disease)    rare   GI bleed    Grade I diastolic dysfunction    Hard of hearing    hear more on left side- no hearing aid   History of colon polyps    History of transfusion 2016   Hydronephrosis 2018   Left ckd stage 3 no nephrologist per wife adele   Hypertension    Moderate concentric left ventricular hypertrophy 03/23/2017   Noted on ECHO   Nausea & vomiting 06/08/2018   Perforated, severe stomach ulcer (HCC) 1974   Peripheral neuropathy    feet and toes   Pneumonia    Prostate cancer metastatic to bone [facility])    Spinal stenosis    Stroke (HCC)    cerebellalr stroke   TR (tricuspid regurgitation) 03/23/2017   Mild, Noted on ECHO   Transfusion history    none recent   Ureteral stricture    CHRONIC   Urinary incontinence    UTI (urinary tract infection)    frequent   Wears glasses    Past Surgical History:  Procedure Laterality Date   ABDOMINAL SURGERY     bilroths tube '74   APPENDECTOMY     COLONOSCOPY N/A 09/29/2014   Procedure: COLONOSCOPY;  Surgeon: Theda Belfast, MD;  Location: [facility];  Service: Endoscopy;  Laterality: N/A;   CYSTOSCOPY W/ URETERAL  STENT PLACEMENT Left 05/06/2014   Procedure: CYSTOSCOPY WITH RETROGRADE PYELOGRAM/URETERAL STENT PLACEMENT;  Surgeon: Sebastian Ache, MD;  Location: [facility];  Service: Urology;  Laterality: Left;   CYSTOSCOPY W/ URETERAL STENT PLACEMENT Left 03/31/2015   Procedure: CYSTOSCOPY WITH LEFT RETROGRADE PYELOGRAM/URETERAL STENT EXCHANGE;  Surgeon: Sebastian Ache, MD;  [facility]: [facility];  Service: Urology;  Laterality: Left;   CYSTOSCOPY W/ URETERAL STENT PLACEMENT Left 09/15/2015   Procedure: CYSTOSCOPY WITH LEFT RETROGRADE PYELOGRAM/URETERAL STENT EXCHANGE ;  Surgeon: Sebastian Ache, MD;  [facility]: [facility];  Service: Urology;  Laterality: Left;   CYSTOSCOPY W/ URETERAL STENT PLACEMENT Left 04/28/2016   Procedure: CYSTOSCOPY WITH  RETROGRADE PYELOGRAM/URETERAL STENT EXCHANGE;  Surgeon: Sebastian Ache, MD;  [facility]: [facility];  Service: Urology;  Laterality: Left;   CYSTOSCOPY W/ URETERAL STENT PLACEMENT Left 11/08/2016   Procedure: CYSTOSCOPY WITH RETROGRADE PYELOGRAM/URETERAL STENT EXCHANGE;  Surgeon: Sebastian Ache, MD;  [facility];  Service: Urology;  Laterality: Left;   CYSTOSCOPY W/ URETERAL STENT PLACEMENT Left 09/14/2017   Procedure: CYSTOSCOPY WITH RETROGRADE PYELOGRAM/URETERAL STENT PLACEMENT;  Surgeon: Sebastian Ache, MD;  [facility];  Service: Urology;  Laterality: Left;   CYSTOSCOPY W/ URETERAL STENT PLACEMENT Left 04/10/2018   Procedure: CYSTOSCOPY WITH LEFT RETROGRADE URETERAL STENT EXCHANGE;  Surgeon: Sebastian Ache, MD;  Location[facility];  Service: Urology;  Laterality: Left;   CYSTOSCOPY W/ URETERAL STENT PLACEMENT Left 01/31/2019   Procedure: CYSTOSCOPY WITH RETROGRADE PYELOGRAM/URETERAL STENT PLACEMENT;  Surgeon: Sebastian Ache, MD;  [facility];  Service: Urology;  Laterality: Left;  45 MINS   CYSTOSCOPY W/ URETERAL STENT PLACEMENT Left 01/16/2020   Procedure: CYSTOSCOPY WITH RETROGRADE PYELOGRAM/URETERAL STENT EXCHANGED;  Surgeon: Sebastian Ache, MD;  Location: [facility];  Service: Urology;  Laterality: Left;   CYSTOSCOPY W/ URETERAL STENT PLACEMENT Left 04/29/2021   Procedure: CYSTOSCOPY WITH RETROGRADE PYELOGRAM/URETERAL STENT EXCHANGE;  Surgeon: Sebastian Ache, MD;  [facility];  Service: Urology;  Laterality: Left;   CYSTOSCOPY W/ URETERAL STENT PLACEMENT Left 09/06/2022   Procedure: CYSTOSCOPY WITH RETROGRADE PYELOGRAM/URETERAL STENT EXCHANGE WITH LITHOLAPAXY bladder;  Surgeon: Sebastian Ache, MD;  [facility];  Service: Urology;  Laterality: Left;  1 HR   CYSTOSCOPY WITH RETROGRADE PYELOGRAM, URETEROSCOPY AND STENT PLACEMENT Left 10/21/2014   Procedure: CYSTOSCOPY WITH RETROGRADE PYELOGRAM, URETEROSCOPY,BIOPSY AND STENT CHANGE;   Surgeon: Sebastian Ache, MD;  [facility];  Service: Urology;  Laterality: Left;   CYSTOSCOPY/RETROGRADE/URETEROSCOPY Left 06/03/2014   Procedure: CYSTOSCOPY/RETROGRADE/URETEROSCOPY WITH BIOPSY,STENT EXCHANGE;  Surgeon: Sebastian Ache, MD;  [facility];  Service: Urology;  Laterality: Left;   ESOPHAGOGASTRODUODENOSCOPY N/A 09/28/2014   Procedure: ESOPHAGOGASTRODUODENOSCOPY (EGD);  Surgeon: Theda Belfast, MD;  Location: [facility];  Service: Endoscopy;  Laterality: N/A;   GASTRIC ROUX-EN-Y     '74   HERNIA REPAIR     bilateral hernia repairs    IR ANGIO INTRA EXTRACRAN SEL COM CAROTID INNOMINATE BILAT MOD SED  01/20/2021   IR ANGIO VERTEBRAL SEL SUBCLAVIAN INNOMINATE BILAT MOD SED  01/20/2021   IR US GUIDE VASC ACCESS RIGHT  01/20/2021   LAPAROSCOPIC APPENDECTOMY N/A 02/15/2020   Procedure: APPENDECTOMY LAPAROSCOPIC;  Surgeon: Griselda Miner, MD;  [facility];  Service: General;  Laterality: N/A;   TONSILLECTOMY  as child   TOTAL HIP ARTHROPLASTY Left 05/19/2016   Procedure: LEFT TOTAL HIP ARTHROPLASTY ANTERIOR APPROACH;  Surgeon: Kathryne Hitch, MD;  [facility];  Service: Orthopedics;  Laterality: Left;   TRANSURETHRAL RESECTION OF PROSTATE  TRANSURETHRAL RESECTION OF PROSTATE N/A 09/06/2022   Procedure: REPEAT TRANSURETHRAL RESECTION OF THE PROSTATE (TURP);  Surgeon: Sebastian Ache, MD;  Location: [facility];  Service: Urology;  Laterality: N/A;   VAGOTOMY  1950's     A IV Location/Drains/Wounds Patient Lines/Drains/Airways Status     Active Line/Drains/Airways     Name Placement date Placement time Site Days   Peripheral IV 06/10/23 22 G Anterior;Right Wrist 06/10/23  1417  Wrist  less than 1   Urethral Catheter Dr.Manny Latex;Triple-lumen 24 Fr. 09/06/22  1327  Latex;Triple-lumen  277   Ureteral Drain/Stent Left ureter 7 Fr. 09/06/22  1255  Left ureter  277   Wound / Incision (Open or Dehisced) 01/20/21 Puncture Wrist Right arterial access puncture  site- right radial  01/20/21  1037  Wrist  871   Wound / Incision (Open or Dehisced) 01/20/21 Puncture Groin Right arterial access puncture site- right femoral  01/20/21  1040  Groin  871            Intake/Output Last 24 hours No intake or output data in the 24 hours ending 06/10/23 2034  Labs/Imaging Results for orders placed or performed during the hospital encounter of 06/10/23 (from the past 48 hour(s))  Comprehensive metabolic panel     Status: Abnormal   Collection Time: 06/10/23  1:27 PM  Result Value Ref Range   Sodium 127 (L) 135 - 145 mmol/L   Potassium 4.1 3.5 - 5.1 mmol/L   Chloride 90 (L) 98 - 111 mmol/L   CO2 24 22 - 32 mmol/L   Glucose, Bld 114 (H) 70 - 99 mg/dL    Comment: Glucose reference range applies only to samples taken after fasting for at least 8 hours.   BUN 16 8 - 23 mg/dL   Creatinine, Ser 1.61 0.61 - 1.24 mg/dL   Calcium 8.7 (L) 8.9 - 10.3 mg/dL   Total Protein 7.5 6.5 - 8.1 g/dL   Albumin 3.3 (L) 3.5 - 5.0 g/dL   AST 38 15 - 41 U/L   ALT 11 0 - 44 U/L   Alkaline Phosphatase 238 (H) 38 - 126 U/L   Total Bilirubin 0.8 0.3 - 1.2 mg/dL   GFR, Estimated 55 (L) >60 mL/min    Comment: (NOTE) Calculated using the CKD-EPI Creatinine Equation (2021)    Anion gap 13 5 - 15    Comment: Performed at [facility], [address] [place], [place] [address]  CBC     Status: Abnormal   Collection Time: 06/10/23  1:27 PM  Result Value Ref Range   WBC 9.8 4.0 - 10.5 K/uL   RBC 4.20 (L) 4.22 - 5.81 MIL/uL   Hemoglobin 12.5 (L) 13.0 - 17.0 g/dL   HCT 54.0 (L) 98.1 - 19.1 %   MCV 89.3 80.0 - 100.0 fL   MCH 29.8 26.0 - 34.0 pg   MCHC 33.3 30.0 - 36.0 g/dL   RDW 47.8 29.5 - 62.1 %   Platelets 278 150 - 400 K/uL   nRBC 0.0 0.0 - 0.2 %    Comment: Performed at [facility], [address] [place], [place] [address]  Urinalysis, Routine w reflex microscopic -Urine, Clean Catch     Status: Abnormal   Collection Time: 06/10/23  4:53 PM   Result Value Ref Range   Color, Urine YELLOW YELLOW   APPearance CLOUDY (A) CLEAR   Specific Gravity, Urine 1.020 1.005 - 1.030   pH 5.5 5.0 -  8.0   Glucose, UA NEGATIVE NEGATIVE mg/dL   Hgb urine dipstick LARGE (A) NEGATIVE   Bilirubin Urine NEGATIVE NEGATIVE   Ketones, ur NEGATIVE NEGATIVE mg/dL   Protein, ur 409 (A) NEGATIVE mg/dL   Nitrite NEGATIVE NEGATIVE   Leukocytes,Ua MODERATE (A) NEGATIVE    Comment: Performed at [facility], [address] [place], [place] [address]  Urinalysis, Microscopic (reflex)     Status: Abnormal   Collection Time: 06/10/23  4:53 PM  Result Value Ref Range   RBC / HPF >50 0 - 5 RBC/hpf   WBC, UA 21-50 0 - 5 WBC/hpf   Bacteria, UA MANY (A) NONE SEEN   Squamous Epithelial / HPF 0-5 0 - 5 /HPF    Comment: Performed at [facility], [address] [place], [place] [address]   DG Chest 2 View  Result Date: 06/10/2023 CLINICAL DATA:  Altered mental status EXAM: CHEST - 2 VIEW COMPARISON:  October 05, 2020 FINDINGS: The cardiomediastinal silhouette is unchanged in contour. No pleural effusion. No pneumothorax. Revisualization of bibasilar coarse reticular opacities, overall increased since 2022. More confluent heterogeneous opacity at the RIGHT lung base compared to priors. 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 Gaseous distension of bowel beneath the LEFT hemidiaphragm. IMPRESSION: 1. Increased bibasilar coarse reticular opacities with more confluent heterogeneous opacity at the RIGHT lung base. Findings are favored to reflect a combination of atelectasis and fibrosis. Superimposed infection could appear similar. Electronically Signed   By: Meda Klinefelter M.D.   On: 06/10/2023 13:53   DG Lumbar Spine Complete  Result Date: 06/10/2023 CLINICAL DATA:  pain EXAM: LUMBAR SPINE - COMPLETE 4+ VIEW COMPARISON:  February 03, 2022, PET scan dated Jan 02, 2023 FINDINGS: There are five non-rib bearing lumbar-type  vertebral bodies. Straightening of the lumbar lordosis. Trace anterolisthesis of L3-4. Posterior disc osteophyte complexes throughout the lumbar spine. There is no evidence for acute fracture or subluxation. Revisualization of multiple sclerotic foci most confluent at the T11 vertebral body consistent with known osseous metastatic disease. Increased conspicuity of sclerosis of the T10 vertebral body since 2023, but grossly similar since most recent PET. Multiple sclerotic foci in the pelvis, most confluent in the LEFT pelvis. This is grossly similar to prior PET scan but increased since 2023. Severe intervertebral disc space height loss with severe facet arthropathy and multilevel endplate proliferative changes throughout the lumbar spine. LEFT-sided double-J stent in expected position over the LEFT renal collecting system. Sacrum is obscured by overlapping bowel contents and wires. Status post LEFT hip arthroplasty with similar lucency abutting the acetabular component. Severe degenerative changes of the RIGHT hip. Atherosclerotic calcifications. IMPRESSION: 1. No definitive acute fracture or subluxation. 2. Severe degenerative changes of the lumbar spine. 3. Revisualization of known osseous metastatic disease. Electronically Signed   By: Meda Klinefelter M.D.   On: 06/10/2023 13:49    Pending Labs Unresulted Labs (From admission, onward)     Start     Ordered   06/10/23 1807  Urine Culture  Add-on,   URGENT       Question:  Indication  Answer:  Altered mental status (if no other cause identified)   06/10/23 1806            Vitals/Pain Today's Vitals   06/10/23 1645 06/10/23 1715 06/10/23 1921 06/10/23 1930  BP: (!) 146/117 (!) 200/94 (!) 191/88 (!) 197/82  Pulse:  76 81 79  Resp: (!) 21 (!) 22 20 16  Temp:      TempSrc:      SpO2:  98% 96% 96%  Weight:      Height:      PainSc:        Isolation Precautions No active isolations  Medications Medications  ceFEPIme (MAXIPIME) 2 g  in sodium chloride 0.9 % 100 mL IVPB (2 g Intravenous New Bag/Given 06/10/23 1845)  sodium chloride 0.9 % bolus 1,000 mL (0 mLs Intravenous Stopped 06/10/23 1653)  iohexol (OMNIPAQUE) 300 MG/ML solution 100 mL (75 mLs Intravenous Contrast Given 06/10/23 1957)    Mobility non-ambulatory     Focused Assessments Neuro Assessment Handoff: Neuro Assessment: Exceptions to WDL. SEE Flowsheets for neuro assessment. Neuro Checks:     R Recommendations: See Admitting Provider Note  Report given to:   Additional Notes:

## 2023-06-10 NOTE — Assessment & Plan Note (Signed)
Hx of pseudomonas continue on cefepime per pharmacy

## 2023-06-10 NOTE — Assessment & Plan Note (Signed)
Allow permissive HTN  Pt has hx of vasovagal syncope on BP meds

## 2023-06-10 NOTE — ED Notes (Signed)
Dr Davis at bedside.

## 2023-06-10 NOTE — Assessment & Plan Note (Signed)
 Obtain anemia panel  Transfuse for Hg <7 , rapidly dropping or  if symptomatic

## 2023-06-10 NOTE — Assessment & Plan Note (Addendum)
-   most likely multifactorial secondary to combination of  infection  dehydration secondary to decreased by mouth intake,  polypharmacy   - Will rehydrate   - treat underlining infection   - Hold contributing medications   - if no improvement may need further imaging to evaluate for CNS pathology pathology such as MRI of the brain to eval for new mets, suspect acute encephalopathy in the setting of UTI will improve with treatment  - neurological exam appears to be nonfocal but patient unable to cooperate fully    - new liver mets ammonia ordered

## 2023-06-11 DIAGNOSIS — R160 Hepatomegaly, not elsewhere classified: Secondary | ICD-10-CM

## 2023-06-11 DIAGNOSIS — R4182 Altered mental status, unspecified: Secondary | ICD-10-CM | POA: Diagnosis not present

## 2023-06-11 DIAGNOSIS — Z960 Presence of urogenital implants: Secondary | ICD-10-CM | POA: Diagnosis not present

## 2023-06-11 DIAGNOSIS — F05 Delirium due to known physiological condition: Secondary | ICD-10-CM | POA: Diagnosis not present

## 2023-06-11 DIAGNOSIS — F0390 Unspecified dementia without behavioral disturbance: Secondary | ICD-10-CM | POA: Diagnosis present

## 2023-06-11 DIAGNOSIS — M7989 Other specified soft tissue disorders: Secondary | ICD-10-CM | POA: Diagnosis not present

## 2023-06-11 DIAGNOSIS — R6520 Severe sepsis without septic shock: Secondary | ICD-10-CM | POA: Diagnosis present

## 2023-06-11 DIAGNOSIS — A419 Sepsis, unspecified organism: Secondary | ICD-10-CM | POA: Diagnosis present

## 2023-06-11 DIAGNOSIS — I129 Hypertensive chronic kidney disease with stage 1 through stage 4 chronic kidney disease, or unspecified chronic kidney disease: Secondary | ICD-10-CM | POA: Diagnosis present

## 2023-06-11 DIAGNOSIS — F32A Depression, unspecified: Secondary | ICD-10-CM | POA: Diagnosis present

## 2023-06-11 DIAGNOSIS — C787 Secondary malignant neoplasm of liver and intrahepatic bile duct: Secondary | ICD-10-CM | POA: Diagnosis present

## 2023-06-11 DIAGNOSIS — D849 Immunodeficiency, unspecified: Secondary | ICD-10-CM | POA: Diagnosis present

## 2023-06-11 DIAGNOSIS — D62 Acute posthemorrhagic anemia: Secondary | ICD-10-CM | POA: Diagnosis present

## 2023-06-11 DIAGNOSIS — G9340 Encephalopathy, unspecified: Secondary | ICD-10-CM | POA: Diagnosis not present

## 2023-06-11 DIAGNOSIS — E039 Hypothyroidism, unspecified: Secondary | ICD-10-CM | POA: Diagnosis present

## 2023-06-11 DIAGNOSIS — Z96642 Presence of left artificial hip joint: Secondary | ICD-10-CM | POA: Diagnosis not present

## 2023-06-11 DIAGNOSIS — C7951 Secondary malignant neoplasm of bone: Secondary | ICD-10-CM

## 2023-06-11 DIAGNOSIS — N1831 Chronic kidney disease, stage 3a: Secondary | ICD-10-CM | POA: Diagnosis present

## 2023-06-11 DIAGNOSIS — R531 Weakness: Secondary | ICD-10-CM | POA: Diagnosis not present

## 2023-06-11 DIAGNOSIS — I6782 Cerebral ischemia: Secondary | ICD-10-CM | POA: Diagnosis not present

## 2023-06-11 DIAGNOSIS — Z7401 Bed confinement status: Secondary | ICD-10-CM | POA: Diagnosis not present

## 2023-06-11 DIAGNOSIS — Z515 Encounter for palliative care: Secondary | ICD-10-CM

## 2023-06-11 DIAGNOSIS — G9341 Metabolic encephalopathy: Secondary | ICD-10-CM | POA: Diagnosis present

## 2023-06-11 DIAGNOSIS — E871 Hypo-osmolality and hyponatremia: Secondary | ICD-10-CM | POA: Diagnosis present

## 2023-06-11 DIAGNOSIS — N390 Urinary tract infection, site not specified: Secondary | ICD-10-CM | POA: Diagnosis present

## 2023-06-11 DIAGNOSIS — Z1152 Encounter for screening for COVID-19: Secondary | ICD-10-CM | POA: Diagnosis not present

## 2023-06-11 DIAGNOSIS — C61 Malignant neoplasm of prostate: Secondary | ICD-10-CM | POA: Diagnosis present

## 2023-06-11 DIAGNOSIS — Z8616 Personal history of COVID-19: Secondary | ICD-10-CM | POA: Diagnosis not present

## 2023-06-11 DIAGNOSIS — J4489 Other specified chronic obstructive pulmonary disease: Secondary | ICD-10-CM | POA: Diagnosis present

## 2023-06-11 DIAGNOSIS — D638 Anemia in other chronic diseases classified elsewhere: Secondary | ICD-10-CM | POA: Diagnosis present

## 2023-06-11 DIAGNOSIS — A4152 Sepsis due to Pseudomonas: Secondary | ICD-10-CM | POA: Diagnosis present

## 2023-06-11 DIAGNOSIS — R569 Unspecified convulsions: Secondary | ICD-10-CM | POA: Diagnosis present

## 2023-06-11 DIAGNOSIS — Z66 Do not resuscitate: Secondary | ICD-10-CM | POA: Diagnosis present

## 2023-06-11 LAB — PHOSPHORUS: Phosphorus: 1.8 mg/dL — ABNORMAL LOW (ref 2.5–4.6)

## 2023-06-11 LAB — CBC
HCT: 33.2 % — ABNORMAL LOW (ref 39.0–52.0)
Hemoglobin: 11.2 g/dL — ABNORMAL LOW (ref 13.0–17.0)
MCH: 30.8 pg (ref 26.0–34.0)
MCHC: 33.7 g/dL (ref 30.0–36.0)
MCV: 91.2 fL (ref 80.0–100.0)
Platelets: 247 10*3/uL (ref 150–400)
RBC: 3.64 MIL/uL — ABNORMAL LOW (ref 4.22–5.81)
RDW: 14.9 % (ref 11.5–15.5)
WBC: 9.3 10*3/uL (ref 4.0–10.5)
nRBC: 0 % (ref 0.0–0.2)

## 2023-06-11 LAB — COMPREHENSIVE METABOLIC PANEL
ALT: 11 U/L (ref 0–44)
AST: 39 U/L (ref 15–41)
Albumin: 3 g/dL — ABNORMAL LOW (ref 3.5–5.0)
Alkaline Phosphatase: 225 U/L — ABNORMAL HIGH (ref 38–126)
Anion gap: 8 (ref 5–15)
BUN: 15 mg/dL (ref 8–23)
CO2: 26 mmol/L (ref 22–32)
Calcium: 8.2 mg/dL — ABNORMAL LOW (ref 8.9–10.3)
Chloride: 90 mmol/L — ABNORMAL LOW (ref 98–111)
Creatinine, Ser: 1.05 mg/dL (ref 0.61–1.24)
GFR, Estimated: >60 mL/min (ref >60)
Glucose, Bld: 104 mg/dL — ABNORMAL HIGH (ref 70–99)
Potassium: 4.1 mmol/L (ref 3.5–5.1)
Sodium: 124 mmol/L — ABNORMAL LOW (ref 135–145)
Total Bilirubin: 0.7 mg/dL (ref 0.3–1.2)
Total Protein: 6.7 g/dL (ref 6.5–8.1)

## 2023-06-11 LAB — CK: Total CK: 200 U/L (ref 49–397)

## 2023-06-11 LAB — MAGNESIUM: Magnesium: 2.2 mg/dL (ref 1.7–2.4)

## 2023-06-11 LAB — PROTIME-INR
INR: 1 (ref 0.8–1.2)
Prothrombin Time: 13.1 s (ref 11.4–15.2)

## 2023-06-11 LAB — CREATININE, URINE, RANDOM: Creatinine, Urine: 57 mg/dL

## 2023-06-11 LAB — SODIUM, URINE, RANDOM: Sodium, Ur: 37 mmol/L

## 2023-06-11 LAB — OSMOLALITY, URINE: Osmolality, Ur: 415 mosm/kg (ref 300–900)

## 2023-06-11 LAB — OSMOLALITY: Osmolality: 273 mosm/kg — ABNORMAL LOW (ref 275–295)

## 2023-06-11 LAB — AMMONIA: Ammonia: 17 umol/L (ref 9–35)

## 2023-06-11 MED ORDER — SODIUM CHLORIDE 0.9 % IV SOLN: INTRAVENOUS | Status: DC

## 2023-06-11 MED ORDER — GLYCERIN (LAXATIVE) 2.1 G RE SUPP
1.0000 | RECTAL | Status: DC | PRN
  Administered 2023-06-11: 1 via RECTAL
  Filled 2023-06-11 (×3): qty 1

## 2023-06-11 MED ORDER — ACETAMINOPHEN 325 MG PO TABS
650.0000 mg | ORAL_TABLET | Freq: Three times a day (TID) | ORAL | Status: DC
  Administered 2023-06-11 – 2023-06-20 (×24): 650 mg via ORAL
  Filled 2023-06-11 (×26): qty 2

## 2023-06-11 MED ORDER — TRAMADOL HCL 50 MG PO TABS
50.0000 mg | ORAL_TABLET | Freq: Four times a day (QID) | ORAL | Status: DC | PRN
  Administered 2023-06-12 (×2): 50 mg via ORAL
  Filled 2023-06-11 (×2): qty 1

## 2023-06-11 MED ORDER — ENSURE ENLIVE PO LIQD
237.0000 mL | Freq: Two times a day (BID) | ORAL | Status: DC
  Administered 2023-06-11 – 2023-06-19 (×9): 237 mL via ORAL

## 2023-06-11 NOTE — ED Provider Notes (Signed)
Handoff received from prior provider.  Patient with history of metastatic prostate cancer presenting for altered mental status.  Family concern for UTI.  Plan for follow-up urinalysis, likely discharge home.  On reassessment patient is quite confused.  Talk to family his encephalopathy is significant.  Urinalysis is concerning for UTI.  He has history of prior pseudomonal UTI.  Does not appear septic.  Effective family, given the degree of his confusion and concern for possible seasonal infection he is already been on Cipro with concern for treatment failure I think he would benefit from IV antibiotics and observation in the hospital.  I talked with Dr. Adela Glimpse who accepted for admission.  She did recommend possible abdominal imaging.  I talked again with family, CT of the abdomen pelvis as well as CT lumbar spine obtained given some back pain earlier today which does not show any acute findings.  I did do a CT head as well to rule intracranial cause and that was unremarkable.  Patient transferred for admission.   Laurence Spates, MD 06/11/23 [phone]

## 2023-06-11 NOTE — TOC Progression Note (Addendum)
Transition of Care [facility]) - Progression Note    Patient Details  Name: Steven Cain MRN: [id number] Date of Birth: 1930/11/24  Transition of Care [facility]) CM/SW Contact  Beckie Busing, RN Phone [phone]  06/11/2023, 9:28 AM  Clinical Narrative:     TOC acknowledges general consult for home health/DME needs. Currently no needs noted. TOC following.        Expected Discharge Plan and Services                                               Social Determinants of Health (SDOH) Interventions SDOH Screenings   Food Insecurity: No Food Insecurity (06/10/2023)  Housing: Low Risk  (06/10/2023)  Transportation Needs: No Transportation Needs (06/10/2023)  Utilities: Not At Risk (06/10/2023)  Tobacco Use: Medium Risk (06/10/2023)    Readmission Risk Interventions     No data to display

## 2023-06-11 NOTE — Consult Note (Signed)
Consultation Note Date: 06/11/2023   Patient Name: Steven Cain  DOB: [birth year]  MRN: [id number]  Age / Sex: 89 y.o., male  PCP: [name], MD Referring Physician: Azucena Fallen, MD  Reason for Consultation: goals of care  HPI/Patient Profile: 89 y.o. male  with past medical history of prostate cancer with mets to bone on [place] admitted on 06/10/2023 with UTI. Palliative medicine consulted for goals of care.    Primary Decision Maker HCPOA - spouse- [name] primary HCPOA; 2nd HCPOA- dtr Abigail  Discussion: Chart reviewed including labs, progress notes, imaging from this and previous encounters.  Noted per notes Dr. Mosetta Putt discussed palliative care and eventual hospice with patient in August. Referral was made to [facility] for Palliative services- but they have not seen patient yet.  Met with patient and his spouse at bedside. Patient very hard of hearing and continues to be a bit confused.  Lives at home with spouse who is a Engineer, civil (consulting). Patient is retired Development worker, community.  Day to day is mainly bed to chair. He does not get up on his own. Requires assistance with ADL's.  We discussed his current acute and chronic illnesses. I reviewed that abdominal CT scan indicated cancer metastasis to liver which represents worsening disease. Liver function and ammonia are normal.  Primary goals of care for patient is for him to be comfortable.  Hospice services and philosophy of care were discussed and offered.  [name] would like referral for hospice support when patient discharges home.  We discussed pain management- patient takes Tylenol and tramadol at home that helps. She was hesitant to give him tramadol due to his confusion.      SUMMARY OF RECOMMENDATIONS -Continue current inpatient care for UTI -TOC order placed for hospice referral -Start scheduled acetaminophen for pain- 650mg  po TID, add  tramadol 50mg  q6hr prn, d/c hydrocodone/apap  Code Status/Advance Care Planning: DNR   Prognosis:   < 6 months  Discharge Planning: Home with Hospice  Primary Diagnoses: Present on Admission:  Acute encephalopathy  Prostate cancer metastatic to bone (HCC)  Chronic bronchitis (HCC)  UTI (urinary tract infection)  Chronic kidney disease, stage 3b (HCC)  Essential hypertension  Anemia associated with acute blood loss  Hyponatremia   Review of Systems  Unable to perform ROS: Mental status change    Physical Exam Vitals and nursing note reviewed.  Pulmonary:     Effort: Pulmonary effort is normal.  Neurological:     Mental Status: He is alert.     Comments: Very HOH     Vital Signs: BP (!) 198/93 (BP Location: Left Arm)   Pulse 79   Temp 97.7 F (36.5 C) (Oral)   Resp 18   Ht 5\' 3"  (1.6 m)   Wt 70.7 kg   SpO2 100%   BMI 27.61 kg/m  Pain Scale: 0-10   Pain Score: 6    SpO2: SpO2: 100 % O2 Device:SpO2: 100 % O2 Flow Rate: .   IO: Intake/output summary:  Intake/Output Summary (Last  24 hours) at 06/11/2023 1258 Last data filed at 06/11/2023 2536 Gross per 24 hour  Intake 512.51 ml  Output 500 ml  Net 12.51 ml    LBM: Last BM Date :  (3 days prior to admission per wife.) Baseline Weight: Weight: 72 kg Most recent weight: Weight: 70.7 kg       Thank you for this consult. Palliative medicine will continue to follow and assist as needed.   Signed by: Ocie Bob, AGNP-C Palliative Medicine  Time includes:   Preparing to see the patient (e.g., review of tests) Obtaining and/or reviewing separately obtained history Performing a medically necessary appropriate examination and/or evaluation Counseling and educating the patient/family/caregiver Ordering medications, tests, or procedures Referring and communicating with other health care professionals (when not reported separately) Documenting clinical information in the electronic or other health  record Independently interpreting results (not reported separately) and communicating results to the patient/family/caregiver Care coordination (not reported separately) Clinical documentation   Please contact Palliative Medicine Team phone at [phone] for questions and concerns.  For individual provider: See Loretha Stapler

## 2023-06-11 NOTE — Evaluation (Signed)
Physical Therapy Evaluation Patient Details Name: Steven Cain MRN: [id number] DOB: Mar 07, 1931 Today's Date: 06/11/2023  History of Present Illness  89 y.o. male presenting with incr confusion and back pain. CT abdomen pelvis as well and CT lumbar spine  does not show any acute findings.  I CT head  to rule intracranial cause and that was unremarkable. admitted with UTI, encephalopathy.  PMH: depression, htn, gi bleed, bph, CVA, gerd, and metastatic prostate cancer.  Clinical Impression  Pt admitted with above diagnosis.  Pt reports assisting pt at home with transfers and amb; pt currently weaker and more confused than baseline per pt wife; will follow in acute setting  Pt currently with functional limitations due to the deficits listed below (see PT Problem List). Pt will benefit from acute skilled PT to increase their independence and safety with mobility to allow discharge.           If plan is discharge home, recommend the following: A little help with walking and/or transfers;A little help with bathing/dressing/bathroom;Assistance with cooking/housework;Help with stairs or ramp for entrance;Direct supervision/assist for financial management;Direct supervision/assist for medications management;Assist for transportation   Can travel by private vehicle        Equipment Recommendations None recommended by PT  Recommendations for Other Services       Functional Status Assessment Patient has had a recent decline in their functional status and demonstrates the ability to make significant improvements in function in a reasonable and predictable amount of time.     Precautions / Restrictions Precautions Precautions: Fall Restrictions Weight Bearing Restrictions: No      Mobility  Bed Mobility Overal bed mobility: Needs Assistance Bed Mobility: Supine to Sit     Supine to sit: Min assist     General bed mobility comments: tactile  cues to progress LEs off bed, assist to  elevate trunk    Transfers Overall transfer level: Needs assistance   Transfers: Bed to chair/wheelchair/BSC   Stand pivot transfers: Min assist, +2 physical assistance, +2 safety/equipment         General transfer comment: assist to power up to stand and pivot to [facility];    Ambulation/Gait               General Gait Details: NT - pt left on BSC for privacy - with wife present for safety  Stairs            Wheelchair Mobility     Tilt Bed    Modified Rankin (Stroke Patients Only)       Balance Overall balance assessment: Needs assistance Sitting-balance support: Feet supported, Bilateral upper extremity supported, Single extremity supported Sitting balance-Leahy Scale: Fair                                       Pertinent Vitals/Pain Pain Assessment Pain Assessment: Faces Faces Pain Scale: Hurts a little bit Pain Location: back Pain Descriptors / Indicators: Discomfort, Grimacing Pain Intervention(s): Monitored during session    Home Living Family/patient expects to be discharged to:: Private residence Living Arrangements: Spouse/significant other Available Help at Discharge: Family Type of Home: House Home Access: Ramped entrance       Home Layout: One level Home Equipment: Agricultural consultant (2 wheels);Wheelchair - Fish farm manager Comments: some info above taken from previous admission    Prior Function Prior Level of Function : Needs assist  Physical Assist : Mobility (physical)     Mobility Comments: wife assists with bed mobility and amb short distances in the home/to bathroom;       Extremity/Trunk Assessment   Upper Extremity Assessment Upper Extremity Assessment: Defer to OT evaluation    Lower Extremity Assessment Lower Extremity Assessment: Generalized weakness       Communication   Communication Communication: Hearing impairment  Cognition Arousal: Alert Behavior During Therapy:  WFL for tasks assessed/performed Overall Cognitive Status: Impaired/Different from baseline                                 General Comments: pt wife endorses pt is more confused than his baseline; follows one  step commands with incr time        General Comments      Exercises     Assessment/Plan    PT Assessment Patient needs continued PT services  PT Problem List Decreased strength;Decreased activity tolerance;Decreased mobility;Decreased balance;Decreased safety awareness       PT Treatment Interventions Functional mobility training;Therapeutic activities;Therapeutic exercise;Patient/family education;Gait training    PT Goals (Current goals can be found in the Care Plan section)  Acute Rehab PT Goals PT Goal Formulation: With patient Time For Goal Achievement: 06/26/23    Frequency Min 1X/week     Co-evaluation               AM-PAC PT "6 Clicks" Mobility  Outcome Measure Help needed turning from your back to your side while in a flat bed without using bedrails?: A Little Help needed moving from lying on your back to sitting on the side of a flat bed without using bedrails?: A Little Help needed moving to and from a bed to a chair (including a wheelchair)?: A Little Help needed standing up from a chair using your arms (e.g., wheelchair or bedside chair)?: A Little Help needed to walk in hospital room?: A Lot Help needed climbing 3-5 steps with a railing? : Total 6 Click Score: 15    End of Session Equipment Utilized During Treatment: Gait belt Activity Tolerance: Patient tolerated treatment well Patient left: with call bell/phone within reach;with family/visitor present (BSC)   PT Visit Diagnosis: Other abnormalities of gait and mobility (R26.89);Muscle weakness (generalized) (M62.81)    Time: 5621-3086 PT Time Calculation (min) (ACUTE ONLY): 20 min   Charges:   PT Evaluation $PT Eval Low Complexity: 1 Low   PT General Charges $$  ACUTE PT VISIT: 1 Visit         Steven Cain, PT  Acute Rehab Dept [facility]) [phone]  06/11/2023   [facility] 06/11/2023, 12:02 PM

## 2023-06-11 NOTE — Evaluation (Signed)
Occupational Therapy Evaluation Patient Details Name: Steven Cain MRN: [id number] DOB: [birth year] Today's Date: 06/11/2023   History of Present Illness Patient is a 89 year old male presenting with increased confusion and back pain. CT abdomen pelvis as well and CT lumbar spine  does not show any acute findings.Patient was admitted with UTI, encephalopathy.  PMH: depression, htn, gi bleed, bph, CVA, gerd, and metastatic prostate cancer.   Clinical Impression   Patient is a 89 year old male who was admitted for above. Patient lives at home with wife A for ADLs but able to transfer with little support from wife at baseline. Currently, patient's cog is difficult to assess with various staff members in room to assist with toileting tasks and patients hearing aids are not here. Patient  was HHA + 2 with increased fear of falling to transfer off BSC to recliner in room with increased cues for backing up to chair to complete transfer prior to attempting to sit. Patient was noted to have decreased functional activity tolerance, decreased endurance, decreased standing balance, decreased safety awareness, and decreased knowledge of AD/AE impacting participation in ADLs. Patient and wife would benefit from [facility] services to ensure patient can be as independent as possible in next level of care to reduce caregiver burden. Patient would continue to benefit from skilled OT services at this time while admitted and after d/c to address noted deficits in order to improve overall safety and independence in ADLs.        If plan is discharge home, recommend the following: A lot of help with bathing/dressing/bathroom;Assistance with cooking/housework;Direct supervision/assist for medications management;Assist for transportation;Help with stairs or ramp for entrance;Direct supervision/assist for financial management;A lot of help with walking and/or transfers    Functional Status Assessment  Patient has had a recent  decline in their functional status and/or demonstrates limited ability to make significant improvements in function in a reasonable and predictable amount of time  Equipment Recommendations  BSC/3in1       Precautions / Restrictions Precautions Precautions: Fall Restrictions Weight Bearing Restrictions: No      Mobility Bed Mobility               General bed mobility comments: patient was sitting on BSC at start of sesison          Balance Overall balance assessment: Needs assistance Sitting-balance support: Feet supported, Bilateral upper extremity supported, Single extremity supported Sitting balance-Leahy Scale: Fair       Standing balance-Leahy Scale: Poor Standing balance comment: needed BUE HHA to maintain balance increased fear of falling       ADL either performed or assessed with clinical judgement   ADL Overall ADL's : Needs assistance/impaired Eating/Feeding: Minimal assistance;Sitting   Grooming: Sitting;Minimal assistance   Upper Body Bathing: Sitting;Maximal assistance   Lower Body Bathing: Sitting/lateral leans;Maximal assistance   Upper Body Dressing : Sitting;Standing;Maximal assistance   Lower Body Dressing: Sit to/from stand;Sitting/lateral leans;Maximal assistance Lower Body Dressing Details (indicate cue type and reason): patients wife reported doing this for patient at home Toilet Transfer: +2 for physical assistance;+2 for safety/equipment;Minimal assistance Toilet Transfer Details (indicate cue type and reason): needing HHA to advance from [facility] to recliner in room with increased time. patients wife reporting that home is too small to use walker. she reported that patient holds onto her to get up and then she stands behind him to get to where he needs to go in house because most of his falls are posterior per patients  wife report. unclear if patietn holds furniture or how she cues him at this time. patietn did not have hearing aids in place  difficult to communicate cues. Toileting- Clothing Manipulation and Hygiene: Sit to/from stand;Maximal assistance                            Pertinent Vitals/Pain Pain Assessment Pain Assessment: Faces Faces Pain Scale: Hurts a little bit Pain Location: back Pain Descriptors / Indicators: Discomfort, Grimacing Pain Intervention(s): Limited activity within patient's tolerance, Monitored during session     Extremity/Trunk Assessment Upper Extremity Assessment Upper Extremity Assessment: Difficult to assess due to impaired cognition   Lower Extremity Assessment Lower Extremity Assessment: Defer to PT evaluation   Cervical / Trunk Assessment Cervical / Trunk Assessment: Kyphotic   Communication Communication Communication: Hearing impairment (does not have hearing aids here.)   Cognition Arousal: Alert Behavior During Therapy: WFL for tasks assessed/performed Overall Cognitive Status: Impaired/Different from baseline         General Comments: HOH during session, wife present reporting he is more confused than baseline. too many people in room during transfer to attempt further cog testing.                Home Living Family/patient expects to be discharged to:: Private residence Living Arrangements: Spouse/significant other Available Help at Discharge: Family Type of Home: House Home Access: Ramped entrance     Home Layout: One level         Bathroom Toilet: Handicapped height     Home Equipment: Agricultural consultant (2 wheels);Wheelchair - Sport and exercise psychologist Comments: some info above taken from previous admission      Prior Functioning/Environment Prior Level of Function : Needs assist       Physical Assist : Mobility (physical)     Mobility Comments: wife assists with bed mobility and amb short distances in the home/to bathroom; ADLs Comments: wife helps with dressing, toileting and bathing tasks. bedroom too small for walker.         OT Problem List: Decreased activity tolerance;Impaired balance (sitting and/or standing);Decreased safety awareness;Decreased cognition;Decreased knowledge of use of DME or AE;Decreased knowledge of precautions;Cardiopulmonary status limiting activity;Pain      OT Treatment/Interventions:      OT Goals(Current goals can be found in the care plan section) Acute Rehab OT Goals Patient Stated Goal: to go home per wife OT Goal Formulation: With patient/family Time For Goal Achievement: 06/25/23 Potential to Achieve Goals: Fair  OT Frequency:         AM-PAC OT "6 Clicks" Daily Activity     Outcome Measure Help from another person eating meals?: A Little Help from another person taking care of personal grooming?: A Little Help from another person toileting, which includes using toliet, bedpan, or urinal?: A Lot Help from another person bathing (including washing, rinsing, drying)?: A Lot Help from another person to put on and taking off regular upper body clothing?: A Lot Help from another person to put on and taking off regular lower body clothing?: A Lot 6 Click Score: 14   End of Session Equipment Utilized During Treatment: Gait belt;Other (comment) [facility]) Nurse Communication: Mobility status;Other (comment) (in room for part of session)  Activity Tolerance: Patient tolerated treatment well Patient left: in chair;with call bell/phone within reach;with chair alarm set;with family/visitor present  OT Visit Diagnosis: Unsteadiness on feet (R26.81);Muscle weakness (generalized) (M62.81);History of falling (Z91.81)  Time: 3474-2595 OT Time Calculation (min): 9 min Charges:  OT General Charges $OT Visit: 1 Visit OT Evaluation $OT Eval Low Complexity: 1 Low  Camil Wilhelmsen OTR/L, MS Acute Rehabilitation Department Office# [phone]   Selinda Flavin 06/11/2023, 3:38 PM

## 2023-06-11 NOTE — Progress Notes (Signed)
Initial Nutrition Assessment  INTERVENTION:   -Ensure Plus High Protein po BID, each supplement provides 350 kcal and 20 grams of protein.   NUTRITION DIAGNOSIS:   Increased nutrient needs related to cancer and cancer related treatments as evidenced by estimated needs.  GOAL:   Patient will meet greater than or equal to 90% of their needs  MONITOR:   PO intake, Supplement acceptance, Labs, Weight trends, I & O's  REASON FOR ASSESSMENT:   Consult Assessment of nutrition requirement/status  ASSESSMENT:   89 y.o. male  with past medical history of prostate cancer with mets to bone on Eliguard and Xgeva- admitted on 06/10/2023 with UTI.  Patient alert/oriented x 1.  Per chart review, patient requiring feeding assistance a at this time. Ate 50% of breakfast this morning with assistance from family.  Patient was seen by palliative care, pt to go home with hospice. Prognosis <6 months. Will order Ensure supplements to help supplement intakes.  Per weight records, pt has lost 24 lbs since 1/22 (13% wt loss x 9 months, insignificant for time frame). Concerning for advanced age.  Medications: Senokot  Labs reviewed: Low Na Low Phos  NUTRITION - FOCUSED PHYSICAL EXAM:  Unable to complete  Diet Order:   Diet Order             Diet Heart Room service appropriate? Yes; Fluid consistency: Thin  Diet effective now                   EDUCATION NEEDS:   No education needs have been identified at this time  Skin:  Skin Assessment: Reviewed RN Assessment  Last BM:  PTA  Height:   Ht Readings from Last 1 Encounters:  06/10/23 5\' 3"  (1.6 m)    Weight:   Wt Readings from Last 1 Encounters:  06/10/23 70.7 kg    BMI:  Body mass index is 27.61 kg/m.  Estimated Nutritional Needs:   Kcal:  1800-2000  Protein:  80-95g  Fluid:  2L/day   Tilda Franco, MS, RD, LDN Inpatient Clinical Dietitian Contact information available via Amion

## 2023-06-11 NOTE — Progress Notes (Signed)
PROGRESS NOTE    Steven Cain  WUJ:[id number] DOB: 1930-11-19 DOA: 06/10/2023 PCP: [name], MD   Brief Narrative:  Steven Cain is a 89 y.o. male with medical history significant of depression, htn, gi bleed, bph, CVA, gerd, and metastatic prostate cancer.  Patient presents to the hospital with his family for notably worsening confusion over the past 5 days.  History of Pseudomonas with similar presentation who was recently treated with Cipro with no improvement.  Hospitalist called for admission in the setting of sepsis UTI with concern for failure of outpatient therapy.  Assessment & Plan:   Principal Problem:   Acute encephalopathy Active Problems:   Prostate cancer metastatic to bone (HCC)   Chronic bronchitis (HCC)   UTI (urinary tract infection)   Chronic kidney disease, stage 3b (HCC)   Anemia associated with acute blood loss   Hyponatremia   Essential hypertension   History of CVA (cerebrovascular accident)   Severe sepsis secondary to UTI  Tachypneic, hypothermic with notable source of UTI Encephalopathy secondary to sepsis/UTI Continue broad-spectrum antibiotics cefepime given prior pseudomonal UTI, follow cultures, de-escalate as appropriate  Acute encephalopathy -Secondary to sepsis and UTI as above -Rule out polypharmacy -Labs unrevealing, imaging without acute etiology -Improving with symptomatic treatment of UTI per wife at bedside -High risk for hospital delirium, continue precautions  Hypovolemic hyponatremia -Continue to increase p.o. intake, hold IV fluids unless otherwise indicated in the setting of fluid shortage  Prostate cancer metastatic to bone (HCC) Chronic followed by URology  Hold Zytiga Contiune prednisone 5mg  at daily dose   Chronic bronchitis (HCC) At baseline, continue supportive care, nebs, currently on steroids as above   Chronic kidney disease, stage 3b (HCC) At baseline, avoid nephrotoxins    Essential  hypertension Patient allowed permissive hypertension at baseline given orthostatic symptoms, syncope and very sensitive to blood pressure medications" per his wife   Anemia associated with chronic disease -Likely secondary to metastatic prostate cancer to bone   History of CVA (cerebrovascular accident) Continue on aspirin  DVT prophylaxis: SCDs Start: 06/10/23 2341 Code Status:   Code Status: Limited: Do not attempt resuscitation (DNR) -DNR-LIMITED -Do Not Intubate/DNI  Family Communication: Wife at bedside  Status is: Inpatient  Dispo: The patient is from: Home              Anticipated d/c is to: Home              Anticipated d/c date is: 24 to 48 hours              Patient currently not medically stable for discharge  Consultants:  None  Procedures:  None  Antimicrobials:  Cefepime  Subjective: No acute issues or events overnight review of systems limited given patient's mental status, keeps asking staff "are you running for Mayor" -denies overt pain nausea vomiting headache fevers or chills.  Objective: Vitals:   06/10/23 1930 06/10/23 1945 06/10/23 2200 06/11/23 0558  BP: (!) 197/82 (!) 203/88 (!) 197/85 (!) 198/93  Pulse: 79 80 84 79  Resp: 16 (!) 24 20 18   Temp:   98.3 F (36.8 C) 97.7 F (36.5 C)  TempSrc:   Oral Oral  SpO2: 96% 97% 94% 100%  Weight:   70.7 kg   Height:   5\' 3"  (1.6 m)     Intake/Output Summary (Last 24 hours) at 06/11/2023 0750 Last data filed at 06/11/2023 0103 Gross per 24 hour  Intake 272.51 ml  Output --  Net 272.51  ml   Filed Weights   06/10/23 1229 06/10/23 2200  Weight: 72 kg 70.7 kg    Examination:  General:  Pleasantly resting in bed, No acute distress.  Alert -oriented to person only HEENT:  Normocephalic atraumatic.  Sclerae nonicteric, noninjected.  Extraocular movements intact bilaterally. Neck:  Without mass or deformity.  Trachea is midline. Lungs:  Clear to auscultate bilaterally without rhonchi, wheeze, or  rales. Heart:  Regular rate and rhythm.  Without murmurs, rubs, or gallops. Abdomen:  Soft, nontender, nondistended.  Without guarding or rebound. Extremities: Without cyanosis, clubbing, edema, or obvious deformity. Skin:  Warm and dry, no erythema.  Data Reviewed: I have personally reviewed following labs and imaging studies  CBC: Recent Labs  Lab 06/10/23 1327 06/11/23 0043  WBC 9.8 9.3  HGB 12.5* 11.2*  HCT 37.5* 33.2*  MCV 89.3 91.2  PLT 278 247   Basic Metabolic Panel: Recent Labs  Lab 06/10/23 1327 06/11/23 0043  NA 127* 124*  K 4.1 4.1  CL 90* 90*  CO2 24 26  GLUCOSE 114* 104*  BUN 16 15  CREATININE 1.24 1.05  CALCIUM 8.7* 8.2*  MG  --  2.2  PHOS  --  1.8*   GFR: Estimated Creatinine Clearance: 41.3 mL/min (by C-G formula based on SCr of 1.05 mg/dL). Liver Function Tests: Recent Labs  Lab 06/10/23 1327 06/11/23 0043  AST 38 39  ALT 11 11  ALKPHOS 238* 225*  BILITOT 0.8 0.7  PROT 7.5 6.7  ALBUMIN 3.3* 3.0*   Recent Labs  Lab 06/11/23 0043  AMMONIA 17   Coagulation Profile: Recent Labs  Lab 06/11/23 0043  INR 1.0   Cardiac Enzymes: Recent Labs  Lab 06/11/23 0043  CKTOTAL 200    No results found for this or any previous visit (from the past 240 hour(s)).   Radiology Studies: CT L-SPINE NO CHARGE  Result Date: 06/10/2023 CLINICAL DATA:  Metastatic prostate cancer, lower back pain, confusion EXAM: CT LUMBAR SPINE WITHOUT CONTRAST TECHNIQUE: Multidetector CT imaging of the lumbar spine was performed without intravenous contrast administration. Multiplanar CT image reconstructions were also generated. RADIATION DOSE REDUCTION: This exam was performed according to the departmental dose-optimization program which includes automated exposure control, adjustment of the mA and/or kV according to patient size and/or use of iterative reconstruction technique. COMPARISON:  01/12/2023 FINDINGS: Segmentation: 5 lumbar type vertebrae. Alignment: Mild  degenerative anterolisthesis of L3 on L4. Otherwise alignment is anatomic. Vertebrae: Diffuse sclerotic bony metastases are again identified, similar in appearance to recent PET scan. Sclerotic metastatic disease is seen throughout the visualized thoracolumbar spine, thoracic cage, and bony pelvis. No evidence of pathologic fracture. Paraspinal and other soft tissues: Paraspinal soft tissues are grossly unremarkable. Please see concurrent CT abdomen and pelvis exam for intraperitoneal and retroperitoneal findings. Disc levels: There is extensive multilevel spondylosis and facet hypertrophy throughout the lumbar spine. Changes are most pronounced from L3-4 through L5-S1, with mild central canal stenosis and bilateral neural foraminal encroachment. Reconstructed images demonstrate no additional findings. IMPRESSION: 1. Diffuse sclerotic bony metastatic disease within the visualized portions of the thoracolumbar spine, thoracic cage, and bony pelvis. Similar appearance and distribution to recent PET scan 01/12/2023. 2. No acute or pathologic fractures. 3. Extensive multilevel lumbar spondylosis and facet hypertrophy, unchanged since prior exam. Electronically Signed   By: Sharlet Salina M.D.   On: 06/10/2023 21:00   CT ABDOMEN PELVIS W CONTRAST  Result Date: 06/10/2023 CLINICAL DATA:  Abdominal pain, generalized weakness, lower back pain, known history  of metastatic prostate cancer EXAM: CT ABDOMEN AND PELVIS WITH CONTRAST TECHNIQUE: Multidetector CT imaging of the abdomen and pelvis was performed using the standard protocol following bolus administration of intravenous contrast. RADIATION DOSE REDUCTION: This exam was performed according to the departmental dose-optimization program which includes automated exposure control, adjustment of the mA and/or kV according to patient size and/or use of iterative reconstruction technique. CONTRAST:  75mL OMNIPAQUE IOHEXOL 300 MG/ML  SOLN COMPARISON:  02/03/2022,  01/12/2023 FINDINGS: Lower chest: Trace bilateral pleural effusions, right greater than left. Bibasilar subpleural scarring and fibrosis again noted. No acute airspace disease. Hepatobiliary: Innumerable hypodense liver masses are identified, consistent with metastatic disease. Largest index lesion within the left lobe liver image 22/301 measures 3.4 cm. Gallbladder is unremarkable. No intrahepatic biliary duct dilation. Pancreas: Diffuse pancreatic atrophy. No focal abnormality or acute inflammatory change. Spleen: Normal in size without focal abnormality. Adrenals/Urinary Tract: Chronic left renal atrophy, with indwelling left ureteral stent extending from the left renal pelvis into the bladder lumen. Mild right renal atrophy without focal abnormality. The adrenals are stable. Evaluation of the bladder is limited by streak artifact from left hip arthroplasty. No evidence of filling defect. Stomach/Bowel: No bowel obstruction or ileus. 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 No bowel wall thickening or acute inflammatory change. Vascular/Lymphatic: Aortic atherosclerosis again identified. There is high-grade stenosis within the right common iliac artery, estimated greater than 90%. 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 High-grade stenosis is also seen within the left common femoral artery just proximal to the bifurcation, also estimated at greater than 90%. No pathologic adenopathy within the abdomen or pelvis. Reproductive: Stable appearance of the prostate. Other: No free fluid or free intraperitoneal gas. No abdominal wall hernia. Musculoskeletal: Diffuse sclerotic metastatic disease is identified throughout the visualized spine, thoracic cage, and bony pelvis. Distribution is similar to recent PET scan, with significant progression since the 2023 CT study. There are no acute displaced  fractures. Unremarkable left hip arthroplasty. Reconstructed images demonstrate no additional findings. IMPRESSION: 1. Interval development of innumerable hypodense masses throughout the liver, consistent with hepatic metastases in this patient with a known history of metastatic prostate cancer. 2. Diffuse sclerotic bony metastases, with a similar appearance to recent PET scan. No acute fractures. 3. Trace bilateral pleural effusions, right greater than left. 4. Stable bibasilar subpleural scarring and fibrosis. No acute airspace disease. 5.  Aortic Atherosclerosis (ICD10-I70.0). 6. Chronic left renal atrophy, with stable appearance of the indwelling left ureteral stent. Electronically Signed   By: Sharlet Salina M.D.   On: 06/10/2023 20:57   CT Head Wo Contrast  Result Date: 06/10/2023 CLINICAL DATA:  Mental status change, unknown cause EXAM: CT HEAD WITHOUT CONTRAST TECHNIQUE: Contiguous axial images were obtained from the base of the skull through the vertex without intravenous contrast. RADIATION DOSE REDUCTION: This exam was performed according to the departmental dose-optimization program which includes automated exposure control, adjustment of the mA and/or kV according to patient size and/or use of iterative reconstruction technique. COMPARISON:  MRI head 10/05/2020 FINDINGS: Brain: Cerebral ventricle sizes are concordant with the degree of cerebral volume loss. Patchy and confluent areas of decreased attenuation are noted throughout the deep and periventricular white matter of the cerebral hemispheres bilaterally, compatible with chronic microvascular ischemic disease. No evidence of large-territorial acute infarction. No parenchymal hemorrhage. No mass lesion. No extra-axial collection. No mass effect or midline shift. No hydrocephalus. Basilar  cisterns are patent. Vascular: No hyperdense vessel. Atherosclerotic calcifications are present within the cavernous internal carotid and vertebral arteries.  Skull: No acute fracture or focal lesion. Sinuses/Orbits: Bilateral maxillary sinus mucosal thickening. Paranasal sinuses and mastoid air cells are clear. The orbits are unremarkable. Other: None. IMPRESSION: No acute intracranial abnormality. Electronically Signed   By: Tish Frederickson M.D.   On: 06/10/2023 20:52   DG Chest 2 View  Result Date: 06/10/2023 CLINICAL DATA:  Altered mental status EXAM: CHEST - 2 VIEW COMPARISON:  October 05, 2020 FINDINGS: The cardiomediastinal silhouette is unchanged in contour. No pleural effusion. No pneumothorax. Revisualization of bibasilar coarse reticular opacities, overall increased since 2022. More confluent heterogeneous opacity at the RIGHT lung base compared to priors. 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 Gaseous distension of bowel beneath the LEFT hemidiaphragm. IMPRESSION: 1. Increased bibasilar coarse reticular opacities with more confluent heterogeneous opacity at the RIGHT lung base. Findings are favored to reflect a combination of atelectasis and fibrosis. Superimposed infection could appear similar. Electronically Signed   By: Meda Klinefelter M.D.   On: 06/10/2023 13:53   DG Lumbar Spine Complete  Result Date: 06/10/2023 CLINICAL DATA:  pain EXAM: LUMBAR SPINE - COMPLETE 4+ VIEW COMPARISON:  February 03, 2022, PET scan dated Jan 02, 2023 FINDINGS: There are five non-rib bearing lumbar-type vertebral bodies. Straightening of the lumbar lordosis. Trace anterolisthesis of L3-4. Posterior disc osteophyte complexes throughout the lumbar spine. There is no evidence for acute fracture or subluxation. Revisualization of multiple sclerotic foci most confluent at the T11 vertebral body consistent with known osseous metastatic disease. Increased conspicuity of sclerosis of the T10 vertebral body since 2023, but grossly similar since most recent PET. Multiple sclerotic foci in the pelvis, most confluent in the LEFT pelvis. This is grossly  similar to prior PET scan but increased since 2023. Severe intervertebral disc space height loss with severe facet arthropathy and multilevel endplate proliferative changes throughout the lumbar spine. LEFT-sided double-J stent in expected position over the LEFT renal collecting system. Sacrum is obscured by overlapping bowel contents and wires. Status post LEFT hip arthroplasty with similar lucency abutting the acetabular component. Severe degenerative changes of the RIGHT hip. Atherosclerotic calcifications. IMPRESSION: 1. No definitive acute fracture or subluxation. 2. Severe degenerative changes of the lumbar spine. 3. Revisualization of known osseous metastatic disease. Electronically Signed   By: Meda Klinefelter M.D.   On: 06/10/2023 13:49     Scheduled Meds:  aspirin EC  81 mg Oral Daily   influenza vaccine adjuvanted  0.5 mL Intramuscular Tomorrow-1000   predniSONE  5 mg Oral Daily   senna-docusate  2 tablet Oral BID   Continuous Infusions:  sodium chloride 75 mL/hr at 06/11/23 0014   ceFEPime (MAXIPIME) IV 2 g (06/11/23 0652)     LOS: 0 days   Time spent:  Azucena Fallen, DO [facility]  If 7PM-7AM, please contact night-coverage [email or web address]  06/11/2023, 7:50 AM

## 2023-06-12 DIAGNOSIS — G9340 Encephalopathy, unspecified: Secondary | ICD-10-CM | POA: Diagnosis not present

## 2023-06-12 LAB — BASIC METABOLIC PANEL
Anion gap: 9 (ref 5–15)
BUN: 15 mg/dL (ref 8–23)
CO2: 24 mmol/L (ref 22–32)
Calcium: 8 mg/dL — ABNORMAL LOW (ref 8.9–10.3)
Chloride: 93 mmol/L — ABNORMAL LOW (ref 98–111)
Creatinine, Ser: 0.98 mg/dL (ref 0.61–1.24)
GFR, Estimated: >60 mL/min (ref >60)
Glucose, Bld: 92 mg/dL (ref 70–99)
Potassium: 3.7 mmol/L (ref 3.5–5.1)
Sodium: 126 mmol/L — ABNORMAL LOW (ref 135–145)

## 2023-06-12 LAB — CBC
HCT: 30.8 % — ABNORMAL LOW (ref 39.0–52.0)
Hemoglobin: 10.3 g/dL — ABNORMAL LOW (ref 13.0–17.0)
MCH: 30.7 pg (ref 26.0–34.0)
MCHC: 33.4 g/dL (ref 30.0–36.0)
MCV: 91.7 fL (ref 80.0–100.0)
Platelets: 225 10*3/uL (ref 150–400)
RBC: 3.36 MIL/uL — ABNORMAL LOW (ref 4.22–5.81)
RDW: 14.6 % (ref 11.5–15.5)
WBC: 8.1 10*3/uL (ref 4.0–10.5)
nRBC: 0 % (ref 0.0–0.2)

## 2023-06-12 LAB — URINE CULTURE: Culture: <10000 — AB

## 2023-06-12 MED ORDER — SALINE SPRAY 0.65 % NA SOLN
1.0000 | NASAL | Status: DC | PRN
  Filled 2023-06-12: qty 44

## 2023-06-12 MED ORDER — POLYETHYLENE GLYCOL 3350 17 G PO PACK
17.0000 g | PACK | Freq: Two times a day (BID) | ORAL | Status: DC
  Administered 2023-06-12 – 2023-06-15 (×6): 17 g via ORAL
  Filled 2023-06-12 (×3): qty 1

## 2023-06-12 NOTE — Plan of Care (Signed)

## 2023-06-12 NOTE — Progress Notes (Signed)
PROGRESS NOTE    Steven Cain  ONG:[id number] DOB: 16-Dec-1930 DOA: 06/10/2023 PCP: [name], MD   Brief Narrative:  Steven Cain is a 89 y.o. male with medical history significant of depression, htn, gi bleed, bph, CVA, gerd, and metastatic prostate cancer.  Patient presents to the hospital with his family for notably worsening confusion over the past 5 days.  History of Pseudomonas with similar presentation who was recently treated with Cipro with no improvement.  Hospitalist called for admission in the setting of sepsis UTI with concern for failure of outpatient therapy.  Assessment & Plan:   Principal Problem:   Acute encephalopathy Active Problems:   Prostate cancer metastatic to bone (HCC)   Chronic bronchitis (HCC)   UTI (urinary tract infection)   Chronic kidney disease, stage 3b (HCC)   Anemia associated with acute blood loss   Hyponatremia   Essential hypertension   History of CVA (cerebrovascular accident)   Sepsis secondary to UTI (HCC)  Severe sepsis secondary to UTI  Tachypneic, hypothermic with notable source of UTI Encephalopathy secondary to sepsis/UTI Continue broad-spectrum antibiotics cefepime given prior pseudomonal UTI, follow cultures, de-escalate as appropriate  Acute encephalopathy, improving Chronic baseline dementia -Secondary to sepsis and UTI as above -Rule out polypharmacy -Micro shows two different urine cultures - one with E Faecalis, the other negative - will finish 3 days of abx 06/13/23 -Improving with symptomatic treatment of UTI per wife at bedside -High risk for hospital delirium (questionable hallucinations overnight on 28/29th) - continue precautions  Hypovolemic hyponatremia -Continue to increase p.o. intake, hold IV fluids unless otherwise indicated in the setting of fluid shortage  Prostate cancer metastatic to bone (HCC) Chronic followed by Urology  Hold Zytiga Continue prednisone 5mg  at daily dose   Chronic bronchitis  (HCC) At baseline, continue supportive care, nebs, currently on steroids as above   Chronic kidney disease, stage 3b (HCC) At baseline, avoid nephrotoxins    Essential hypertension Patient allowed permissive hypertension at baseline given orthostatic symptoms, syncope and very sensitive to blood pressure medications" per his wife   Anemia associated with chronic disease -Likely secondary to metastatic prostate cancer to bone   History of CVA (cerebrovascular accident) Continue on aspirin  DVT prophylaxis: SCDs Start: 06/10/23 2341 Code Status:   Code Status: Limited: Do not attempt resuscitation (DNR) -DNR-LIMITED -Do Not Intubate/DNI  Family Communication: Wife at bedside  Status is: Inpatient  Dispo: The patient is from: Home              Anticipated d/c is to: Home              Anticipated d/c date is: 24 to 48 hours              Patient currently not medically stable for discharge  Consultants:  None  Procedures:  None  Antimicrobials:  Cefepime  Subjective: No acute issues or events overnight review of systems limited given patient's mental status, keeps asking staff "are you running for Mayor" -denies overt pain nausea vomiting headache fevers or chills.  Objective: Vitals:   06/11/23 0558 06/11/23 1302 06/11/23 2011 06/12/23 0602  BP: (!) 198/93 (!) 186/85 (!) 182/73 (!) 191/76  Pulse: 79 81 70 75  Resp: 18 18 16 16   Temp: 97.7 F (36.5 C) (!) 97.5 F (36.4 C) (!) 97.4 F (36.3 C) 98.3 F (36.8 C)  TempSrc: Oral Oral Oral Oral  SpO2: 100% 98% 95% 94%  Weight:      Height:  Intake/Output Summary (Last 24 hours) at 06/12/2023 0745 Last data filed at 06/12/2023 0500 Gross per 24 hour  Intake 763.26 ml  Output 1150 ml  Net -386.74 ml   Filed Weights   06/10/23 1229 06/10/23 2200  Weight: 72 kg 70.7 kg    Examination:  General:  Pleasantly resting in bed, No acute distress.  Alert -oriented to person only HEENT:  Normocephalic atraumatic.   Sclerae nonicteric, noninjected.  Extraocular movements intact bilaterally. Neck:  Without mass or deformity.  Trachea is midline. Lungs:  Clear to auscultate bilaterally without rhonchi, wheeze, or rales. Heart:  Regular rate and rhythm.  Without murmurs, rubs, or gallops. Abdomen:  Soft, nontender, nondistended.  Without guarding or rebound. Extremities: Without cyanosis, clubbing, edema, or obvious deformity. Skin:  Warm and dry, no erythema.  Data Reviewed: I have personally reviewed following labs and imaging studies  CBC: Recent Labs  Lab 06/10/23 1327 06/11/23 0043 06/12/23 0559  WBC 9.8 9.3 8.1  HGB 12.5* 11.2* 10.3*  HCT 37.5* 33.2* 30.8*  MCV 89.3 91.2 91.7  PLT 278 247 225   Basic Metabolic Panel: Recent Labs  Lab 06/10/23 1327 06/11/23 0043 06/12/23 0559  NA 127* 124* 126*  K 4.1 4.1 3.7  CL 90* 90* 93*  CO2 24 26 24   GLUCOSE 114* 104* 92  BUN 16 15 15   CREATININE 1.24 1.05 0.98  CALCIUM 8.7* 8.2* 8.0*  MG  --  2.2  --   PHOS  --  1.8*  --    GFR: Estimated Creatinine Clearance: 44.2 mL/min (by C-G formula based on SCr of 0.98 mg/dL). Liver Function Tests: Recent Labs  Lab 06/10/23 1327 06/11/23 0043  AST 38 39  ALT 11 11  ALKPHOS 238* 225*  BILITOT 0.8 0.7  PROT 7.5 6.7  ALBUMIN 3.3* 3.0*   Recent Labs  Lab 06/11/23 0043  AMMONIA 17   Coagulation Profile: Recent Labs  Lab 06/11/23 0043  INR 1.0   Cardiac Enzymes: Recent Labs  Lab 06/11/23 0043  CKTOTAL 200    No results found for this or any previous visit (from the past 240 hour(s)).   Radiology Studies: CT L-SPINE NO CHARGE  Result Date: 06/10/2023 CLINICAL DATA:  Metastatic prostate cancer, lower back pain, confusion EXAM: CT LUMBAR SPINE WITHOUT CONTRAST TECHNIQUE: Multidetector CT imaging of the lumbar spine was performed without intravenous contrast administration. Multiplanar CT image reconstructions were also generated. RADIATION DOSE REDUCTION: This exam was performed  according to the departmental dose-optimization program which includes automated exposure control, adjustment of the mA and/or kV according to patient size and/or use of iterative reconstruction technique. COMPARISON:  01/12/2023 FINDINGS: Segmentation: 5 lumbar type vertebrae. Alignment: Mild degenerative anterolisthesis of L3 on L4. Otherwise alignment is anatomic. Vertebrae: Diffuse sclerotic bony metastases are again identified, similar in appearance to recent PET scan. Sclerotic metastatic disease is seen throughout the visualized thoracolumbar spine, thoracic cage, and bony pelvis. No evidence of pathologic fracture. Paraspinal and other soft tissues: Paraspinal soft tissues are grossly unremarkable. Please see concurrent CT abdomen and pelvis exam for intraperitoneal and retroperitoneal findings. Disc levels: There is extensive multilevel spondylosis and facet hypertrophy throughout the lumbar spine. Changes are most pronounced from L3-4 through L5-S1, with mild central canal stenosis and bilateral neural foraminal encroachment. Reconstructed images demonstrate no additional findings. IMPRESSION: 1. Diffuse sclerotic bony metastatic disease within the visualized portions of the thoracolumbar spine, thoracic cage, and bony pelvis. Similar appearance and distribution to recent PET scan 01/12/2023. 2. No acute  or pathologic fractures. 3. Extensive multilevel lumbar spondylosis and facet hypertrophy, unchanged since prior exam. Electronically Signed   By: Sharlet Salina M.D.   On: 06/10/2023 21:00   CT ABDOMEN PELVIS W CONTRAST  Result Date: 06/10/2023 CLINICAL DATA:  Abdominal pain, generalized weakness, lower back pain, known history of metastatic prostate cancer EXAM: CT ABDOMEN AND PELVIS WITH CONTRAST TECHNIQUE: Multidetector CT imaging of the abdomen and pelvis was performed using the standard protocol following bolus administration of intravenous contrast. RADIATION DOSE REDUCTION: This exam was  performed according to the departmental dose-optimization program which includes automated exposure control, adjustment of the mA and/or kV according to patient size and/or use of iterative reconstruction technique. CONTRAST:  75mL OMNIPAQUE IOHEXOL 300 MG/ML  SOLN COMPARISON:  02/03/2022, 01/12/2023 FINDINGS: Lower chest: Trace bilateral pleural effusions, right greater than left. Bibasilar subpleural scarring and fibrosis again noted. No acute airspace disease. Hepatobiliary: Innumerable hypodense liver masses are identified, consistent with metastatic disease. Largest index lesion within the left lobe liver image 22/301 measures 3.4 cm. Gallbladder is unremarkable. No intrahepatic biliary duct dilation. Pancreas: Diffuse pancreatic atrophy. No focal abnormality or acute inflammatory change. Spleen: Normal in size without focal abnormality. Adrenals/Urinary Tract: Chronic left renal atrophy, with indwelling left ureteral stent extending from the left renal pelvis into the bladder lumen. Mild right renal atrophy without focal abnormality. The adrenals are stable. Evaluation of the bladder is limited by streak artifact from left hip arthroplasty. No evidence of filling defect. Stomach/Bowel: No bowel obstruction or ileus. 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 No bowel wall thickening or acute inflammatory change. Vascular/Lymphatic: Aortic atherosclerosis again identified. There is high-grade stenosis within the right common iliac artery, estimated greater than 90%. 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 High-grade stenosis is also seen within the left common femoral artery just proximal to the bifurcation, also estimated at greater than 90%. No pathologic adenopathy within the abdomen or pelvis. Reproductive: Stable appearance of the prostate. Other: No free fluid or free intraperitoneal  gas. No abdominal wall hernia. Musculoskeletal: Diffuse sclerotic metastatic disease is identified throughout the visualized spine, thoracic cage, and bony pelvis. Distribution is similar to recent PET scan, with significant progression since the 2023 CT study. There are no acute displaced fractures. Unremarkable left hip arthroplasty. Reconstructed images demonstrate no additional findings. IMPRESSION: 1. Interval development of innumerable hypodense masses throughout the liver, consistent with hepatic metastases in this patient with a known history of metastatic prostate cancer. 2. Diffuse sclerotic bony metastases, with a similar appearance to recent PET scan. No acute fractures. 3. Trace bilateral pleural effusions, right greater than left. 4. Stable bibasilar subpleural scarring and fibrosis. No acute airspace disease. 5.  Aortic Atherosclerosis (ICD10-I70.0). 6. Chronic left renal atrophy, with stable appearance of the indwelling left ureteral stent. Electronically Signed   By: Sharlet Salina M.D.   On: 06/10/2023 20:57   CT Head Wo Contrast  Result Date: 06/10/2023 CLINICAL DATA:  Mental status change, unknown cause EXAM: CT HEAD WITHOUT CONTRAST TECHNIQUE: Contiguous axial images were obtained from the base of the skull through the vertex without intravenous contrast. RADIATION DOSE REDUCTION: This exam was performed according to the departmental dose-optimization program which includes automated exposure control, adjustment of the mA and/or kV according to patient size and/or use of iterative reconstruction technique. COMPARISON:  MRI head 10/05/2020 FINDINGS: Brain: Cerebral ventricle sizes are concordant with the degree of cerebral volume  loss. Patchy and confluent areas of decreased attenuation are noted throughout the deep and periventricular white matter of the cerebral hemispheres bilaterally, compatible with chronic microvascular ischemic disease. No evidence of large-territorial acute  infarction. No parenchymal hemorrhage. No mass lesion. No extra-axial collection. No mass effect or midline shift. No hydrocephalus. Basilar cisterns are patent. Vascular: No hyperdense vessel. Atherosclerotic calcifications are present within the cavernous internal carotid and vertebral arteries. Skull: No acute fracture or focal lesion. Sinuses/Orbits: Bilateral maxillary sinus mucosal thickening. Paranasal sinuses and mastoid air cells are clear. The orbits are unremarkable. Other: None. IMPRESSION: No acute intracranial abnormality. Electronically Signed   By: Tish Frederickson M.D.   On: 06/10/2023 20:52   DG Chest 2 View  Result Date: 06/10/2023 CLINICAL DATA:  Altered mental status EXAM: CHEST - 2 VIEW COMPARISON:  October 05, 2020 FINDINGS: The cardiomediastinal silhouette is unchanged in contour. No pleural effusion. No pneumothorax. Revisualization of bibasilar coarse reticular opacities, overall increased since 2022. More confluent heterogeneous opacity at the RIGHT lung base compared to priors. 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 Gaseous distension of bowel beneath the LEFT hemidiaphragm. IMPRESSION: 1. Increased bibasilar coarse reticular opacities with more confluent heterogeneous opacity at the RIGHT lung base. Findings are favored to reflect a combination of atelectasis and fibrosis. Superimposed infection could appear similar. Electronically Signed   By: Meda Klinefelter M.D.   On: 06/10/2023 13:53   DG Lumbar Spine Complete  Result Date: 06/10/2023 CLINICAL DATA:  pain EXAM: LUMBAR SPINE - COMPLETE 4+ VIEW COMPARISON:  February 03, 2022, PET scan dated Jan 02, 2023 FINDINGS: There are five non-rib bearing lumbar-type vertebral bodies. Straightening of the lumbar lordosis. Trace anterolisthesis of L3-4. Posterior disc osteophyte complexes throughout the lumbar spine. There is no evidence for acute fracture or subluxation. Revisualization of multiple sclerotic  foci most confluent at the T11 vertebral body consistent with known osseous metastatic disease. Increased conspicuity of sclerosis of the T10 vertebral body since 2023, but grossly similar since most recent PET. Multiple sclerotic foci in the pelvis, most confluent in the LEFT pelvis. This is grossly similar to prior PET scan but increased since 2023. Severe intervertebral disc space height loss with severe facet arthropathy and multilevel endplate proliferative changes throughout the lumbar spine. LEFT-sided double-J stent in expected position over the LEFT renal collecting system. Sacrum is obscured by overlapping bowel contents and wires. Status post LEFT hip arthroplasty with similar lucency abutting the acetabular component. Severe degenerative changes of the RIGHT hip. Atherosclerotic calcifications. IMPRESSION: 1. No definitive acute fracture or subluxation. 2. Severe degenerative changes of the lumbar spine. 3. Revisualization of known osseous metastatic disease. Electronically Signed   By: Meda Klinefelter M.D.   On: 06/10/2023 13:49     Scheduled Meds:  acetaminophen  650 mg Oral TID   aspirin EC  81 mg Oral Daily   feeding supplement  237 mL Oral BID BM   influenza vaccine adjuvanted  0.5 mL Intramuscular Tomorrow-1000   predniSONE  5 mg Oral Daily   senna-docusate  2 tablet Oral BID   Continuous Infusions:  ceFEPime (MAXIPIME) IV 2 g (06/12/23 0626)     LOS: 1 day   Time spent:  Azucena Fallen, DO [facility]  If 7PM-7AM, please contact night-coverage [email or web address]  06/12/2023, 7:45 AM

## 2023-06-12 NOTE — Progress Notes (Signed)
[facility] Liaison RN note  Received request from [facility] for hospice services at home after discharge. Chart and patient information under review by Hospice physician.   Spoke with patient and wife at bedside to initiate education related to hospice philosophy, services, and team approach to care. Patient/family verbalized understanding of information given.   Per discussion, the plan is for discharge home by EMS in the next day or two after completion of IVAB treatment for UTI.  DME needs discussed, there are no immediate DME needs.   Please send signed and completed DNR with patient/family. Please provide prescriptions at discharge as needed for ongoing symptom management.   [facility] information and contact numbers given to [name] .  Above information shared with Samson Frederic.   Please call with any hospice related questions or concerns.  Thank you for the opportunity to participate in this patient's care.  Thea Gist, Charity fundraiser, BSN [employer] [phone]

## 2023-06-12 NOTE — TOC Progression Note (Signed)
Transition of Care [facility]) - Progression Note    Patient Details  Name: Steven Cain MRN: [id number] Date of Birth: May 06, 1931  Transition of Care [facility]) CM/SW Contact  Beckie Busing, RN Phone Number:[phone]  06/12/2023, 11:03 AM  Clinical Narrative:    TOC acknowledges referral for hospice services. CM at bedside to discuss referral with wife and to offer choice. Wife requesting [facility]. Referral info has been sent to [facility] with [facility].         Expected Discharge Plan and Services                                               Social Determinants of Health (SDOH) Interventions SDOH Screenings   Food Insecurity: No Food Insecurity (06/10/2023)  Housing: Low Risk  (06/10/2023)  Transportation Needs: No Transportation Needs (06/10/2023)  Utilities: Not At Risk (06/10/2023)  Tobacco Use: Medium Risk (06/10/2023)    Readmission Risk Interventions     No data to display

## 2023-06-13 ENCOUNTER — Inpatient Hospital Stay (HOSPITAL_COMMUNITY)
Admit: 2023-06-13 | Discharge: 2023-06-13 | Disposition: A | Discharge status: Home / Self Care | Payer: Medicare Other | Attending: Obstetrics and Gynecology | Admitting: Obstetrics and Gynecology

## 2023-06-13 DIAGNOSIS — R569 Unspecified convulsions: Secondary | ICD-10-CM

## 2023-06-13 DIAGNOSIS — G9340 Encephalopathy, unspecified: Secondary | ICD-10-CM | POA: Diagnosis not present

## 2023-06-13 LAB — BASIC METABOLIC PANEL
Anion gap: 7 (ref 5–15)
BUN: 15 mg/dL (ref 8–23)
CO2: 26 mmol/L (ref 22–32)
Calcium: 8.1 mg/dL — ABNORMAL LOW (ref 8.9–10.3)
Chloride: 94 mmol/L — ABNORMAL LOW (ref 98–111)
Creatinine, Ser: 1.03 mg/dL (ref 0.61–1.24)
GFR, Estimated: >60 mL/min (ref >60)
Glucose, Bld: 108 mg/dL — ABNORMAL HIGH (ref 70–99)
Potassium: 3.9 mmol/L (ref 3.5–5.1)
Sodium: 127 mmol/L — ABNORMAL LOW (ref 135–145)

## 2023-06-13 LAB — URINE CULTURE: Culture: 50000 — AB

## 2023-06-13 LAB — SODIUM, URINE, RANDOM: Sodium, Ur: 73 mmol/L

## 2023-06-13 LAB — OSMOLALITY, URINE: Osmolality, Ur: 373 mosm/kg (ref 300–900)

## 2023-06-13 MED ORDER — TRAMADOL HCL 50 MG PO TABS
50.0000 mg | ORAL_TABLET | Freq: Four times a day (QID) | ORAL | Status: DC | PRN
  Administered 2023-06-13: 50 mg via ORAL
  Filled 2023-06-13: qty 1

## 2023-06-13 MED ORDER — THIAMINE HCL 100 MG/ML IJ SOLN
500.0000 mg | Freq: Three times a day (TID) | INTRAVENOUS | Status: AC
  Administered 2023-06-13 – 2023-06-16 (×9): 500 mg via INTRAVENOUS
  Filled 2023-06-13 (×9): qty 5

## 2023-06-13 MED ORDER — BISACODYL 10 MG RE SUPP
10.0000 mg | Freq: Once | RECTAL | Status: AC
  Administered 2023-06-13: 10 mg via RECTAL
  Filled 2023-06-13: qty 1

## 2023-06-13 MED ORDER — FENTANYL CITRATE PF 50 MCG/ML IJ SOSY
25.0000 ug | PREFILLED_SYRINGE | Freq: Once | INTRAMUSCULAR | Status: AC
  Administered 2023-06-13: 25 ug via INTRAVENOUS
  Filled 2023-06-13: qty 1

## 2023-06-13 MED ORDER — MAGNESIUM HYDROXIDE 400 MG/5ML PO SUSP
15.0000 mL | Freq: Once | ORAL | Status: AC
  Administered 2023-06-13: 15 mL via ORAL
  Filled 2023-06-13: qty 30

## 2023-06-13 MED ORDER — SODIUM CHLORIDE 0.9 % IV BOLUS
1000.0000 mL | Freq: Once | INTRAVENOUS | Status: AC
  Administered 2023-06-13: 1000 mL via INTRAVENOUS

## 2023-06-13 MED ORDER — SODIUM CHLORIDE 0.9 % IV SOLN
1.0000 g | Freq: Four times a day (QID) | INTRAVENOUS | Status: DC
  Administered 2023-06-13 – 2023-06-20 (×25): 1 g via INTRAVENOUS
  Filled 2023-06-13 (×28): qty 1000

## 2023-06-13 NOTE — Progress Notes (Signed)
EEG complete - results pending 

## 2023-06-13 NOTE — Plan of Care (Signed)
  Problem: Clinical Measurements: Goal: Diagnostic test results will improve Outcome: Progressing   Problem: Activity: Goal: Risk for activity intolerance will decrease Outcome: Progressing   Problem: Nutrition: Goal: Adequate nutrition will be maintained Outcome: Progressing   Problem: Elimination: Goal: Will not experience complications related to bowel motility Outcome: Progressing Goal: Will not experience complications related to urinary retention Outcome: Progressing

## 2023-06-13 NOTE — Progress Notes (Addendum)
PROGRESS NOTE    Steven Cain  WGN:[id number] DOB: [birth year] DOA: 06/10/2023 PCP: [name], MD   Brief Narrative:  Steven Cain is a 89 y.o. male with medical history significant of depression, htn, gi bleed, bph, CVA, gerd, and metastatic prostate cancer.  Patient presents to the hospital with his family for notably worsening confusion over the past 5 days.  History of Pseudomonas with similar presentation who was recently treated with Cipro with no improvement.  Hospitalist called for admission in the setting of sepsis UTI with concern for failure of outpatient therapy.  Assessment & Plan:   Principal Problem:   Acute encephalopathy Active Problems:   Prostate cancer metastatic to bone (HCC)   Chronic bronchitis (HCC)   UTI (urinary tract infection)   Chronic kidney disease, stage 3b (HCC)   Anemia associated with acute blood loss   Hyponatremia   Essential hypertension   Seizure (HCC)   History of CVA (cerebrovascular accident)   Sepsis secondary to UTI (HCC)  Severe sepsis secondary to UTI  Tachypneic, hypothermic with notable source of UTI Encephalopathy possibly to sepsis/UTI but if so is not improving with treatment Started on cefepime given prior pseudomonas uti, here e faecalis in mixed culture, will switch to ampicillin  Acute encephalopathy,  Chronic baseline dementia Hospital delirium possibly contributing. If 2/2 infection has not improved w/ appropriate treatment. CT head nothing acute. Wife reports some stereotyped movements and he does have a history of seizure. Hyponatremia likely contributing. Not uremic, no liver dysfunction.  - cont abx as above - eeg - consider MRI though doubt he would hold still for one presently - f/u blood culture - start high-dose thiamine  Hyponatremia Was 134 2 months ago, here 124-126. Hypotonic, possibly hypovolemic. Fluids have been held 2/2 iv shortage - will bolus 1 liter - f/u cortisol, tsh - will repeat urine  sodium and osm tomorrow after fluids  Prostate cancer metastatic to bone (HCC) Low back pain Chronic followed by Urology  CT scan of lumbar spine diffuse disease, nothing acute, no pathologic fractures Hold Zytiga Continue prednisone 5mg  at daily dose Palliative have seen, advising outpt hospice referral (placed)   Chronic bronchitis (HCC) At baseline, continue supportive care, nebs, currently on steroids as above   Chronic kidney disease, stage 3b (HCC) At baseline, avoid nephrotoxins    Essential hypertension Patient allowed permissive hypertension at baseline given orthostatic symptoms, syncope and very sensitive to blood pressure medications" per his wife   Anemia associated with chronic disease -Likely secondary to metastatic prostate cancer to bone   History of CVA (cerebrovascular accident) Continue on aspirin   Constipation Will trial dose of milk of magnesia. No significant stool burden noted on 10/27 CT  DVT prophylaxis: SCDs Start: 06/10/23 2341 Code Status:   Code Status: Limited: Do not attempt resuscitation (DNR) -DNR-LIMITED -Do Not Intubate/DNI  Family Communication: Wife at bedside 10/30  Status is: Inpatient  Dispo: The patient is from: Home              Anticipated d/c is to: Home              Anticipated d/c date is: tbd  Consultants:  None  Procedures:  None  Antimicrobials:  Cefepime>ampicillin  Subjective: No complaints, confused and disoriented  Objective: Vitals:   06/12/23 0602 06/12/23 1420 06/12/23 2042 06/13/23 0630  BP: (!) 191/76 (!) 150/71 (!) 165/110 (!) 181/78  Pulse: 75 79 79 75  Resp: 16 18 16 14   Temp:  98.3 F (36.8 C)  98.1 F (36.7 C) 98.1 F (36.7 C)  TempSrc: Oral  Oral Oral  SpO2: 94% 95% 96% 96%  Weight:      Height:        Intake/Output Summary (Last 24 hours) at 06/13/2023 1310 Last data filed at 06/13/2023 1116 Gross per 24 hour  Intake --  Output 1225 ml  Net -1225 ml   Filed Weights   06/10/23  1229 06/10/23 2200  Weight: 72 kg 70.7 kg    Examination:  General:  confused HEENT:  Normocephalic atraumatic.    Lungs:  normal work of breathing, clear Heart:  Regular rate and rhythm.    Abdomen:  Soft, nontender  Extremities: Without cyanosis, clubbing, edema, or obvious deformity. Skin:  Warm  Back: no midline tenderness  Data Reviewed: I have personally reviewed following labs and imaging studies  CBC: Recent Labs  Lab 06/10/23 1327 06/11/23 0043 06/12/23 0559  WBC 9.8 9.3 8.1  HGB 12.5* 11.2* 10.3*  HCT 37.5* 33.2* 30.8*  MCV 89.3 91.2 91.7  PLT 278 247 225   Basic Metabolic Panel: Recent Labs  Lab 06/10/23 1327 06/11/23 0043 06/12/23 0559  NA 127* 124* 126*  K 4.1 4.1 3.7  CL 90* 90* 93*  CO2 24 26 24   GLUCOSE 114* 104* 92  BUN 16 15 15   CREATININE 1.24 1.05 0.98  CALCIUM 8.7* 8.2* 8.0*  MG  --  2.2  --   PHOS  --  1.8*  --    GFR: Estimated Creatinine Clearance: 44.2 mL/min (by C-G formula based on SCr of 0.98 mg/dL). Liver Function Tests: Recent Labs  Lab 06/10/23 1327 06/11/23 0043  AST 38 39  ALT 11 11  ALKPHOS 238* 225*  BILITOT 0.8 0.7  PROT 7.5 6.7  ALBUMIN 3.3* 3.0*   Recent Labs  Lab 06/11/23 0043  AMMONIA 17   Coagulation Profile: Recent Labs  Lab 06/11/23 0043  INR 1.0   Cardiac Enzymes: Recent Labs  Lab 06/11/23 0043  CKTOTAL 200    Recent Results (from the past 240 hour(s))  Urine Culture     Status: Abnormal   Collection Time: 06/10/23  4:53 PM   Specimen: Urine, Clean Catch  Result Value Ref Range Status   Specimen Description   Final    URINE, CLEAN CATCH Performed at [facility], [address] [place], [place] [address]    Special Requests   Final    NONE Performed at [facility], [address] [place], [place] [address]    Culture (A)  Final    50,000 COLONIES/mL ENTEROCOCCUS FAECALIS WITHIN MIXED CULTURE Performed at [facility], [address]  [place], [place] [address]    Report Status 06/13/2023 FINAL  Final   Organism ID, Bacteria ENTEROCOCCUS FAECALIS (A)  Final      Susceptibility   Enterococcus faecalis - MIC*    AMPICILLIN <=2 SENSITIVE Sensitive     NITROFURANTOIN <=16 SENSITIVE Sensitive     VANCOMYCIN <=0.5 SENSITIVE Sensitive     * 50,000 COLONIES/mL ENTEROCOCCUS FAECALIS  Urine Culture (for pregnant, neutropenic or urologic patients or patients with an indwelling urinary catheter)     Status: Abnormal   Collection Time: 06/11/23  2:14 AM   Specimen: Urine, Clean Catch  Result Value Ref Range Status   Specimen Description   Final    URINE, CLEAN CATCH Performed at [facility], [address]  [place], [place] [address]    Special Requests   Final    NONE Performed at [facility], [address], [place], [place] [address]    Culture (A)  Final    <10,000 COLONIES/mL INSIGNIFICANT GROWTH Performed at [facility], [address], [place], [place] [address]    Report Status 06/12/2023 FINAL  Final     Radiology Studies: No results found.   Scheduled Meds:  acetaminophen  650 mg Oral TID   aspirin EC  81 mg Oral Daily   feeding supplement  237 mL Oral BID BM   influenza vaccine adjuvanted  0.5 mL Intramuscular Tomorrow-1000   polyethylene glycol  17 g Oral BID   predniSONE  5 mg Oral Daily   senna-docusate  2 tablet Oral BID   Continuous Infusions:     LOS: 2 days    Silvano Bilis, MD [facility]  If 7PM-7AM, please contact night-coverage [email or web address]  06/13/2023, 1:10 PM

## 2023-06-13 NOTE — Progress Notes (Signed)
PT Cancellation Note  Patient Details Name: Steven Cain MRN: [id number] DOB: 12/17/1930   Cancelled Treatment:    Reason Eval/Treat Not Completed: Other (comment); Plan is now home with Hospice. PT signing off at this time.   [facility] 06/13/2023, 1:43 PM

## 2023-06-13 NOTE — Procedures (Signed)
Patient Name: Steven Cain  MRN: [id number]  Epilepsy Attending: Charlsie Quest  Referring Physician/Provider: Kathrynn Running, MD  Date: 06/13/2023 Duration: 25.35 mins  Patient history: 89yo m with ams getting eeg to evaluate for seizure  Level of alertness: Awake  AEDs during EEG study: None  Technical aspects: This EEG study was done with scalp electrodes positioned according to the 10-20 International system of electrode placement. Electrical activity was reviewed with band pass filter of 1-70Hz , sensitivity of 7 uV/mm, display speed of 12mm/sec with a 60Hz  notched filter applied as appropriate. EEG data were recorded continuously and digitally stored.  Video monitoring was available and reviewed as appropriate.  Description: EEG showed continuous generalized 3 to 5  Hz theta-delta slowing. Generalized periodic discharges with triphasic morphology at 1- 1.5 Hz were also noted. Hyperventilation and photic stimulation were not performed.     Of note, study was technically difficult due to significant movement and electrode artifact.  ABNORMALITY - Periodic discharges with triphasic morphology, generalized ( GPDs) - Continuous slow, generalized  IMPRESSION: This technically difficult study is suggestive of moderate diffuse encephalopathy but likely related to toxic-metabolic causes like cefepime toxicity. No seizures or definite epileptiform discharges were seen throughout the recording.  Cecia Egge Annabelle Harman

## 2023-06-14 ENCOUNTER — Inpatient Hospital Stay (HOSPITAL_COMMUNITY): Payer: Medicare Other

## 2023-06-14 DIAGNOSIS — G9340 Encephalopathy, unspecified: Secondary | ICD-10-CM | POA: Diagnosis not present

## 2023-06-14 LAB — BASIC METABOLIC PANEL
Anion gap: 9 (ref 5–15)
Anion gap: 9 (ref 5–15)
BUN: 13 mg/dL (ref 8–23)
BUN: 13 mg/dL (ref 8–23)
CO2: 20 mmol/L — ABNORMAL LOW (ref 22–32)
CO2: 23 mmol/L (ref 22–32)
Calcium: 7.7 mg/dL — ABNORMAL LOW (ref 8.9–10.3)
Calcium: 7.9 mg/dL — ABNORMAL LOW (ref 8.9–10.3)
Chloride: 98 mmol/L (ref 98–111)
Chloride: 97 mmol/L — ABNORMAL LOW (ref 98–111)
Creatinine, Ser: 0.94 mg/dL (ref 0.61–1.24)
Creatinine, Ser: 0.88 mg/dL (ref 0.61–1.24)
GFR, Estimated: >60 mL/min (ref >60)
GFR, Estimated: >60 mL/min (ref >60)
Glucose, Bld: 82 mg/dL (ref 70–99)
Glucose, Bld: 85 mg/dL (ref 70–99)
Potassium: 3.8 mmol/L (ref 3.5–5.1)
Potassium: 3.6 mmol/L (ref 3.5–5.1)
Sodium: 127 mmol/L — ABNORMAL LOW (ref 135–145)
Sodium: 129 mmol/L — ABNORMAL LOW (ref 135–145)

## 2023-06-14 LAB — CORTISOL: Cortisol, Plasma: 4.5 ug/dL

## 2023-06-14 LAB — T4, FREE: Free T4: 0.41 ng/dL — ABNORMAL LOW (ref 0.61–1.12)

## 2023-06-14 LAB — MAGNESIUM: Magnesium: 2.3 mg/dL (ref 1.7–2.4)

## 2023-06-14 LAB — PHOSPHORUS: Phosphorus: 1.4 mg/dL — ABNORMAL LOW (ref 2.5–4.6)

## 2023-06-14 LAB — TSH: TSH: 27.23 u[IU]/mL — ABNORMAL HIGH (ref 0.350–4.500)

## 2023-06-14 MED ORDER — ASPIRIN 81 MG PO CHEW
81.0000 mg | CHEWABLE_TABLET | Freq: Every day | ORAL | Status: DC
  Administered 2023-06-14: 81 mg via ORAL
  Filled 2023-06-14: qty 1

## 2023-06-14 MED ORDER — HALOPERIDOL LACTATE 5 MG/ML IJ SOLN
2.0000 mg | Freq: Four times a day (QID) | INTRAMUSCULAR | Status: DC | PRN
  Administered 2023-06-14 – 2023-06-19 (×3): 2 mg via INTRAVENOUS
  Filled 2023-06-14 (×3): qty 1

## 2023-06-14 MED ORDER — ASPIRIN 81 MG PO CHEW
81.0000 mg | CHEWABLE_TABLET | Freq: Every day | ORAL | Status: DC
  Administered 2023-06-16 – 2023-06-20 (×5): 81 mg via ORAL
  Filled 2023-06-14 (×5): qty 1

## 2023-06-14 MED ORDER — POTASSIUM PHOSPHATES 15 MMOLE/5ML IV SOLN
30.0000 mmol | Freq: Once | INTRAVENOUS | Status: AC
  Administered 2023-06-14: 30 mmol via INTRAVENOUS
  Filled 2023-06-14: qty 10

## 2023-06-14 MED ORDER — LORAZEPAM 2 MG/ML IJ SOLN
2.0000 mg | INTRAMUSCULAR | Status: DC | PRN
  Administered 2023-06-14: 2 mg via INTRAVENOUS
  Filled 2023-06-14: qty 1

## 2023-06-14 MED ORDER — HYDRALAZINE HCL 20 MG/ML IJ SOLN
10.0000 mg | Freq: Four times a day (QID) | INTRAMUSCULAR | Status: DC | PRN
  Administered 2023-06-14 – 2023-06-19 (×9): 10 mg via INTRAVENOUS
  Filled 2023-06-14 (×9): qty 1

## 2023-06-14 MED ORDER — LEVOTHYROXINE SODIUM 100 MCG/5ML IV SOLN
75.0000 ug | Freq: Every day | INTRAVENOUS | Status: DC
  Administered 2023-06-14 – 2023-06-17 (×4): 75 ug via INTRAVENOUS
  Filled 2023-06-14 (×4): qty 5

## 2023-06-14 MED ORDER — GADOBUTROL 1 MMOL/ML IV SOLN
7.0000 mL | Freq: Once | INTRAVENOUS | Status: AC | PRN
  Administered 2023-06-14: 7 mL via INTRAVENOUS

## 2023-06-14 NOTE — Progress Notes (Signed)
OT Cancellation Note  Patient Details Name: CJ GOREY MRN: [id number] DOB: 13-Feb-1931   Cancelled Treatment:    Reason Eval/Treat Not Completed: Other (comment) Chart reviewed, pt is discharging home with hospice. OT to sign off at this time.  Michelle Wnek L. Cardelia Sassano, OTR/L  06/14/23, 4:02 PM

## 2023-06-14 NOTE — Plan of Care (Signed)
Alert, disoriented x4. Medicated for agitation today with haldol and received IV ativan for PRN for MRI. Wife remains at bedside and involved with care. Bowel movement noted today. Incontinent of urine.  Plan for Lumbar Puncture tomorrow with Radiology.   Problem: Education: Goal: Knowledge of General Education information will improve Description: Including pain rating scale, medication(s)/side effects and non-pharmacologic comfort measures Outcome: Progressing   Problem: Health Behavior/Discharge Planning: Goal: Ability to manage health-related needs will improve Outcome: Progressing   Problem: Coping: Goal: Level of anxiety will decrease Outcome: Progressing   Problem: Elimination: Goal: Will not experience complications related to bowel motility Outcome: Progressing   Problem: Pain Management: Goal: General experience of comfort will improve Outcome: Progressing   Problem: Safety: Goal: Ability to remain free from injury will improve Outcome: Progressing

## 2023-06-14 NOTE — Progress Notes (Signed)
PROGRESS NOTE    Steven Cain  WUJ:[id number] DOB: 10-09-1930 DOA: 06/10/2023 PCP: [name], MD   Brief Narrative:  Steven Cain is a 89 y.o. male with medical history significant of depression, htn, gi bleed, bph, CVA, gerd, and metastatic prostate cancer.  Patient presents to the hospital with his family for notably worsening confusion over the past 5 days.  History of Pseudomonas with similar presentation who was recently treated with Cipro with no improvement.  Hospitalist called for admission in the setting of sepsis UTI with concern for failure of outpatient therapy.  Assessment & Plan:   Principal Problem:   Acute encephalopathy Active Problems:   Prostate cancer metastatic to bone (HCC)   Chronic bronchitis (HCC)   UTI (urinary tract infection)   Chronic kidney disease, stage 3b (HCC)   Anemia associated with acute blood loss   Hyponatremia   Essential hypertension   Seizure (HCC)   History of CVA (cerebrovascular accident)   Sepsis secondary to UTI (HCC)  Severe sepsis secondary to UTI  Tachypneic, hypothermic with notable source of UTI Encephalopathy possibly to sepsis/UTI but if so is not improving with treatment S/p cefepime, which was discontinued due to worsening of encephalopathy and EEG positive for encephalopathy possible due to cefepime. Prior UCx pseudomonas uti, here e faecalis in mixed culture, so switch to ampicillin  Acute encephalopathy,  Chronic baseline dementia Hospital delirium possibly contributing. If 2/2 infection has not improved w/ appropriate treatment. CT head nothing acute. Wife reports some stereotyped movements and he does have a history of seizure. Hyponatremia likely contributing. Not uremic, no liver dysfunction.  - cont abx as above - eeg negative for seizures, toxic encephalopathy could be due to cefepime - consider MRI though doubt he would hold still for one presently - f/u blood culture - start high-dose thiamine 10/31  started Haldol as needed Discussed with neurology, recommended to repeat imaging, ordered MRI brain if patient cannot get it done then we will do CT scan MRI Brain negative for any acute findings  F/u IR for L.P and follow CSF fluid studies   Hypothyroidism TSH 27 and free T4 level 0.41 10/31 started Synthroid 75 mcg IV daily, transition to oral when patient improves  Hypophosphatemia due to nutritional deficiency, Phos repleted. Monitor electrolytes daily.  Hyponatremia Was 134 2 months ago, here 124-126. Hypotonic, possibly hypovolemic. Fluids have been held 2/2 iv shortage - will bolus 1 liter - f/u cortisol,  tsh 27 elevated, follow free T4 level - will repeat urine sodium and osm tomorrow after fluids  Prostate cancer metastatic to bone (HCC) Low back pain Chronic followed by Urology  CT scan of lumbar spine diffuse disease, nothing acute, no pathologic fractures Hold Zytiga Continue prednisone 5mg  at daily dose Palliative have seen, advising outpt hospice referral (placed)   Chronic bronchitis (HCC) At baseline, continue supportive care, nebs, currently on steroids as above   Chronic kidney disease, stage 3b (HCC) At baseline, avoid nephrotoxins    Essential hypertension Patient allowed permissive hypertension at baseline given orthostatic symptoms, syncope and very sensitive to blood pressure medications" per his wife 10/31 use hydralazine IV as needed if SBP >150 mmHg  Anemia associated with chronic disease -Likely secondary to metastatic prostate cancer to bone   History of CVA (cerebrovascular accident) Continue on aspirin   Constipation Will trial dose of milk of magnesia. No significant stool burden noted on 10/27 CT 10/31 f/u xray KUB  DVT prophylaxis: SCDs Start: 06/10/23 2341 Code Status:  Code Status: Limited: Do not attempt resuscitation (DNR) -DNR-LIMITED -Do Not Intubate/DNI  Family Communication: Wife at bedside 10/30  Status is:  Inpatient  Dispo: The patient is from: Home              Anticipated d/c is to: Home              Anticipated d/c date is: tbd  Consultants:  None  Procedures:  None  Antimicrobials:  Cefepime>ampicillin  Subjective: No significant events overnight, patient was very confused and agitated, restless and all over the place, moving all 4 extremities, unable to offer any complaints.  Patient's wife was at bedside, and patient's daughter was over the phone, discussed management plan and details.   Objective: Vitals:   06/13/23 1334 06/13/23 2012 06/14/23 0612 06/14/23 1329  BP: (!) 191/88 (!) 183/88 (!) 163/81 126/64  Pulse: 74 85 81 75  Resp: 20 16 16 18   Temp: 97.6 F (36.4 C) 97.8 F (36.6 C) (!) 97.4 F (36.3 C) (!) 97.4 F (36.3 C)  TempSrc: Oral Oral Oral Oral  SpO2: 90% 100% 91% 90%  Weight:      Height:        Intake/Output Summary (Last 24 hours) at 06/14/2023 1412 Last data filed at 06/14/2023 1345 Gross per 24 hour  Intake 400 ml  Output 800 ml  Net -400 ml   Filed Weights   06/10/23 1229 06/10/23 2200  Weight: 72 kg 70.7 kg    Examination:  General:  confused HEENT:  Normocephalic atraumatic.    Lungs:  normal work of breathing, clear Heart:  Regular rate and rhythm.    Abdomen:  Soft, nontender  Extremities: Without cyanosis, clubbing, edema, or obvious deformity. Skin:  Warm  Back: no midline tenderness  Data Reviewed: I have personally reviewed following labs and imaging studies  CBC: Recent Labs  Lab 06/10/23 1327 06/11/23 0043 06/12/23 0559  WBC 9.8 9.3 8.1  HGB 12.5* 11.2* 10.3*  HCT 37.5* 33.2* 30.8*  MCV 89.3 91.2 91.7  PLT 278 247 225   Basic Metabolic Panel: Recent Labs  Lab 06/11/23 0043 06/12/23 0559 06/13/23 1322 06/14/23 0547 06/14/23 0907  NA 124* 126* 127* 127* 129*  K 4.1 3.7 3.9 3.8 3.6  CL 90* 93* 94* 98 97*  CO2 26 24 26  20* 23  GLUCOSE 104* 92 108* 82 85  BUN 15 15 15 13 13   CREATININE 1.05 0.98 1.03  0.94 0.88  CALCIUM 8.2* 8.0* 8.1* 7.7* 7.9*  MG 2.2  --   --   --  2.3  PHOS 1.8*  --   --   --  1.4*   GFR: Estimated Creatinine Clearance: 49.2 mL/min (by C-G formula based on SCr of 0.88 mg/dL). Liver Function Tests: Recent Labs  Lab 06/10/23 1327 06/11/23 0043  AST 38 39  ALT 11 11  ALKPHOS 238* 225*  BILITOT 0.8 0.7  PROT 7.5 6.7  ALBUMIN 3.3* 3.0*   Recent Labs  Lab 06/11/23 0043  AMMONIA 17   Coagulation Profile: Recent Labs  Lab 06/11/23 0043  INR 1.0   Cardiac Enzymes: Recent Labs  Lab 06/11/23 0043  CKTOTAL 200    Recent Results (from the past 240 hour(s))  Urine Culture     Status: Abnormal   Collection Time: 06/10/23  4:53 PM   Specimen: Urine, Clean Catch  Result Value Ref Range Status   Specimen Description   Final    URINE, CLEAN CATCH [facility]  [address], [address] [place], [place] [address]    Special Requests   Final    NONE Performed at [facility], [address] [place], [place] [address]    Culture (A)  Final    50,000 COLONIES/mL ENTEROCOCCUS FAECALIS WITHIN MIXED CULTURE Performed at [facility], [address] [place], [place] [address]    Report Status 06/13/2023 FINAL  Final   Organism ID, Bacteria ENTEROCOCCUS FAECALIS (A)  Final      Susceptibility   Enterococcus faecalis - MIC*    AMPICILLIN <=2 SENSITIVE Sensitive     NITROFURANTOIN <=16 SENSITIVE Sensitive     VANCOMYCIN <=0.5 SENSITIVE Sensitive     * 50,000 COLONIES/mL ENTEROCOCCUS FAECALIS  Urine Culture (for pregnant, neutropenic or urologic patients or patients with an indwelling urinary catheter)     Status: Abnormal   Collection Time: 06/11/23  2:14 AM   Specimen: Urine, Clean Catch  Result Value Ref Range Status   Specimen Description   Final    URINE, CLEAN CATCH Performed at [facility], [address], [place], [place] [address]    Special Requests   Final    NONE Performed at [facility], [address] [place], [place] [address]    Culture (A)  Final    <10,000 COLONIES/mL INSIGNIFICANT GROWTH Performed at [facility], [address], [place], [place] [address]    Report Status 06/12/2023 FINAL  Final  Culture, blood (Routine X 2) w Reflex to ID Panel     Status: None (Preliminary result)   Collection Time: 06/13/23  2:52 PM   Specimen: BLOOD  Result Value Ref Range Status   Specimen Description   Final    BLOOD BLOOD LEFT ARM Performed at [facility], [address] [place], [place] [address]    Special Requests   Final    BOTTLES DRAWN AEROBIC ONLY Blood Culture results may not be optimal due to an inadequate volume of blood received in culture bottles Performed at [facility], [address] [place], [place] [address]    Culture   Final    NO GROWTH < 12 HOURS Performed at [facility], [address] [place], [place] [address]    Report Status PENDING  Incomplete  Culture, blood (Routine X 2) w Reflex to ID Panel     Status: None (Preliminary result)   Collection Time: 06/13/23  2:52 PM   Specimen: BLOOD  Result Value Ref Range Status   Specimen Description   Final    BLOOD BLOOD LEFT HAND Performed at [facility], [address], [place], [place] [address]    Special Requests   Final    BOTTLES DRAWN AEROBIC ONLY Blood Culture results may not be optimal due to an inadequate volume of blood received in culture bottles Performed at [facility], [address] [place], [place] [address]    Culture   Final    NO GROWTH < 12 HOURS Performed at [facility], [address] [place], [place] [address]    Report Status PENDING  Incomplete     Radiology Studies: MR BRAIN W WO CONTRAST  Result Date: 06/14/2023 CLINICAL DATA:  Altered mental status EXAM: MRI HEAD WITHOUT AND WITH CONTRAST TECHNIQUE: Multiplanar, multiecho pulse  sequences of the brain and surrounding structures were obtained without and with intravenous contrast. CONTRAST:  7mL GADAVIST GADOBUTROL 1 MMOL/ML IV SOLN COMPARISON:  Head CT 06/10/2023 FINDINGS: Brain: There is no acute intracranial hemorrhage, extra-axial fluid collection, or acute infarct. Parenchymal volume is within expected limits for age. The ventricles are normal in size. There is mild background chronic small-vessel ischemic change in the supratentorial white matter. There are small remote infarcts in the right thalamus and right cerebellar hemisphere. The pituitary and suprasellar region are normal. There is no mass lesion or abnormal enhancement. There is no mass effect or midline shift. Vascular: Normal flow voids. Skull and upper cervical spine: Normal marrow signal. Sinuses/Orbits: The paranasal sinuses are essentially clear. The globes and orbits are unremarkable. Other: The mastoid air cells and middle ear cavities are clear. IMPRESSION: No acute intracranial pathology. Electronically Signed   By: Lesia Hausen M.D.   On: 06/14/2023 13:11   EEG adult  Result Date: 06/13/2023 Charlsie Quest, MD     06/13/2023  4:38 PM Patient Name: Steven Cain MRN: [id number] Epilepsy Attending: Charlsie Quest Referring Physician/Provider: Kathrynn Running, MD Date: 06/13/2023 Duration: 25.35 mins Patient history: 89yo m with ams getting eeg to evaluate for seizure Level of alertness: Awake AEDs during EEG study: None Technical aspects: This EEG study was done with scalp electrodes positioned according to the 10-20 International system of electrode placement. Electrical activity was reviewed with band pass filter of 1-70Hz , sensitivity of 7 uV/mm, display speed of 27mm/sec with a 60Hz  notched filter applied as appropriate. EEG data were recorded continuously and digitally stored.  Video monitoring was available and reviewed as appropriate. Description: EEG showed continuous generalized 3 to 5  Hz  theta-delta slowing. Generalized periodic discharges with triphasic morphology at 1- 1.5 Hz were also noted. Hyperventilation and photic stimulation were not performed.   Of note, study was technically difficult due to significant movement and electrode artifact. ABNORMALITY - Periodic discharges with triphasic morphology, generalized ( GPDs) - Continuous slow, generalized IMPRESSION: This technically difficult study is suggestive of moderate diffuse encephalopathy but likely related to toxic-metabolic causes like cefepime toxicity. No seizures or definite epileptiform discharges were seen throughout the recording. Priyanka Annabelle Harman     Scheduled Meds:  acetaminophen  650 mg Oral TID   aspirin  81 mg Oral Daily   feeding supplement  237 mL Oral BID BM   influenza vaccine adjuvanted  0.5 mL Intramuscular Tomorrow-1000   polyethylene glycol  17 g Oral BID   predniSONE  5 mg Oral Daily   senna-docusate  2 tablet Oral BID   Continuous Infusions:  ampicillin (OMNIPEN) IV 1 g (06/14/23 1402)   thiamine (VITAMIN B1) injection Stopped (06/14/23 1114)    Time spent 55 minutes   LOS: 3 days    Gillis Santa, MD [facility]  If 7PM-7AM, please contact night-coverage [email or web address]  06/14/2023, 2:12 PM

## 2023-06-15 DIAGNOSIS — G9340 Encephalopathy, unspecified: Secondary | ICD-10-CM | POA: Diagnosis not present

## 2023-06-15 LAB — CBC
HCT: 32.9 % — ABNORMAL LOW (ref 39.0–52.0)
Hemoglobin: 10.9 g/dL — ABNORMAL LOW (ref 13.0–17.0)
MCH: 30.4 pg (ref 26.0–34.0)
MCHC: 33.1 g/dL (ref 30.0–36.0)
MCV: 91.9 fL (ref 80.0–100.0)
Platelets: 293 10*3/uL (ref 150–400)
RBC: 3.58 MIL/uL — ABNORMAL LOW (ref 4.22–5.81)
RDW: 15.5 % (ref 11.5–15.5)
WBC: 10.3 10*3/uL (ref 4.0–10.5)
nRBC: 0.3 % — ABNORMAL HIGH (ref 0.0–0.2)

## 2023-06-15 LAB — COMPREHENSIVE METABOLIC PANEL
ALT: 18 U/L (ref 0–44)
AST: 76 U/L — ABNORMAL HIGH (ref 15–41)
Albumin: 2.6 g/dL — ABNORMAL LOW (ref 3.5–5.0)
Alkaline Phosphatase: 231 U/L — ABNORMAL HIGH (ref 38–126)
Anion gap: 11 (ref 5–15)
BUN: 12 mg/dL (ref 8–23)
CO2: 23 mmol/L (ref 22–32)
Calcium: 7.5 mg/dL — ABNORMAL LOW (ref 8.9–10.3)
Chloride: 95 mmol/L — ABNORMAL LOW (ref 98–111)
Creatinine, Ser: 1.04 mg/dL (ref 0.61–1.24)
GFR, Estimated: >60 mL/min (ref >60)
Glucose, Bld: 119 mg/dL — ABNORMAL HIGH (ref 70–99)
Potassium: 3.6 mmol/L (ref 3.5–5.1)
Sodium: 129 mmol/L — ABNORMAL LOW (ref 135–145)
Total Bilirubin: 0.8 mg/dL (ref 0.3–1.2)
Total Protein: 6 g/dL — ABNORMAL LOW (ref 6.5–8.1)

## 2023-06-15 LAB — MAGNESIUM: Magnesium: 2.2 mg/dL (ref 1.7–2.4)

## 2023-06-15 LAB — PHOSPHORUS: Phosphorus: 1.6 mg/dL — ABNORMAL LOW (ref 2.5–4.6)

## 2023-06-15 MED ORDER — MELATONIN 5 MG PO TABS
5.0000 mg | ORAL_TABLET | Freq: Every evening | ORAL | Status: AC | PRN
Start: 2023-06-15 — End: 2023-06-17
  Administered 2023-06-15 – 2023-06-17 (×3): 5 mg via ORAL
  Filled 2023-06-15 (×3): qty 1

## 2023-06-15 MED ORDER — ENOXAPARIN SODIUM 40 MG/0.4ML IJ SOSY
40.0000 mg | PREFILLED_SYRINGE | INTRAMUSCULAR | Status: DC
  Administered 2023-06-15 – 2023-06-19 (×5): 40 mg via SUBCUTANEOUS
  Filled 2023-06-15 (×6): qty 0.4

## 2023-06-15 NOTE — Plan of Care (Signed)
  Problem: Education: Goal: Knowledge of General Education information will improve Description: Including pain rating scale, medication(s)/side effects and non-pharmacologic comfort measures Outcome: Progressing   Problem: Clinical Measurements: Goal: Ability to maintain clinical measurements within normal limits will improve Outcome: Progressing Goal: Will remain free from infection Outcome: Progressing Goal: Diagnostic test results will improve Outcome: Progressing Goal: Respiratory complications will improve Outcome: Progressing Goal: Cardiovascular complication will be avoided Outcome: Progressing   Problem: Nutrition: Goal: Adequate nutrition will be maintained Outcome: Progressing   Problem: Elimination: Goal: Will not experience complications related to bowel motility Outcome: Progressing Goal: Will not experience complications related to urinary retention Outcome: Progressing   Problem: Pain Management: Goal: General experience of comfort will improve Outcome: Progressing   Problem: Safety: Goal: Ability to remain free from injury will improve Outcome: Progressing   Problem: Skin Integrity: Goal: Risk for impaired skin integrity will decrease Outcome: Progressing

## 2023-06-15 NOTE — Hospital Course (Addendum)
89 y.o.m w/ history significant of depression, htn, gi bleed, bph, COPD not on home oxygen, CVA, gerd, and metastatic prostate cancer presented to the hospital with his family for notably worsening confusion over the past 5 days.History of Pseudomonas with similar presentation who was recently treated with Cipro with no improvement. Patient on chronic prednisone. In the ED underwent CT head chest x-ray CT abdomen pelvis no acute finding, showed possible combination of atelectasis and fibrosis in the lung and diffusely sclerotic bony metastatic disease in the thoracolumbar spine thoracic and bony pelvis similar to PET scan of 01/12/23. Labs with hyponatremia 127, stable renal function LFTs, chronic anemia 12.5 g, UA with WBC 21-50 RBC more than 50 moderate LE negative nitrite, Urine culture obtained and admitted  for severe sepsis due to UTI and UTI, acute metabolic encephalopathy ON chronic baseline dementia.  Patient did not have improvement despite being on antibiotics, urine culture with pneumococcus faecalis-pansensitive  MRI brain with and without>>no acute pathology EEG>> slow generalized continuous wave, with moderate diffuse encephalopathy likely related to toxic metabolic causes like cefepime toxicity with no seizures. 11/1> mentation slowly improving

## 2023-06-15 NOTE — Consult Note (Signed)
NEUROLOGY CONSULTATION  Reason for Consult:Altered Mental Status   CC:   HPI  Steven Cain is a 89 y.o. male with a past medical history of metastatic prostate cancer (on prednisone 5mg  daily), dementia, HTN, GERD, CVA, and GI bleed presenting with 5 days of altered mental status and back pain. He does have a history of pseudomonas UTIs and family initially suspected this was the cause. They took him to [facility] ED on Sunday and he was started on cipro for suspected pseudomonas. Family awknowledges that he does have memory difficulties, however the changes they noticed over the last week were significantly different from his baseline. Per family he was having difficulty speaking and was more confused than normal.  Per family he does walk with assistance short distances, or use a wheelchair.  He did not improve with the initial antibiotics and they decided to bring him to the hospital. On admission UA/Urine culture was positive for enterococcus faecalis, sodium 127, TSH 27, free T4 0.41. MRI brain with and without contrast was negative for acute pathology and he did not have any contrast enhancing lesions. CT Lumbar spine negative for acute or pathologic fracture, he does have metastatic disease in the thoracolumbar spine.   Mental status is slightly improved today per family. Speech is more clear and comprehension is improved.   History is obtained from:Wife, chart review     ROS: A comprehensive review of systems was negative.   Past Medical History:  Past Medical History:  Diagnosis Date   Abnormal EKG    hx of left anterior fasicular block on ekg, no cardiologist   Anxiety    AR (aortic regurgitation) 03/23/2017   Mild, Noted on ECHO   Arthritis    DJD   Benign enlargement of prostate    frequent UTI, trim of prostate done , with some issues of incontinence now.   Cancer (HCC)    Chronic bronchitis (HCC)    chronic- usually has phelgm often   COVID-19 08/2019    fatigue and loss of taste and smell x 10 days did monoclonal antibody therapy   Depression    Fluid retention in legs    resolved   GERD (gastroesophageal reflux disease)    rare   GI bleed    Grade I diastolic dysfunction    Hard of hearing    hear more on left side- no hearing aid   History of colon polyps    History of transfusion 2016   Hydronephrosis 2018   Left ckd stage 3 no nephrologist per wife adele   Hypertension    Moderate concentric left ventricular hypertrophy 03/23/2017   Noted on ECHO   Nausea & vomiting 06/08/2018   Perforated, severe stomach ulcer (HCC) 1974   Peripheral neuropathy    feet and toes   Pneumonia    Prostate cancer metastatic to bone [facility])    Spinal stenosis    Stroke (HCC)    cerebellalr stroke   TR (tricuspid regurgitation) 03/23/2017   Mild, Noted on ECHO   Transfusion history    none recent   Ureteral stricture    CHRONIC   Urinary incontinence    UTI (urinary tract infection)    frequent   Wears glasses     Family History Family History  Problem Relation Age of Onset   Colon cancer Mother 71 - 40   Breast cancer Mother 34 - 69   Prostate cancer Father 81   Thyroid cancer  Brother 31       medullary   Multiple myeloma Maternal Aunt 45 - 79   Breast cancer Maternal Aunt 40 - 49   Colon cancer Maternal Aunt    Pancreatic cancer Maternal Uncle 29   Breast cancer Daughter 75   Breast cancer Cousin 9 - 45       maternal first cousin   Breast cancer Cousin 98 - 6       maternal first cousin   Thyroid cancer Cousin 17 - 49   Esophageal cancer Cousin 55 - 26       maternal first cousin   Heart disease Neg Hx     Allergies:  Allergies  Allergen Reactions   Bee Venom Swelling    Social History:   reports that he has quit smoking. His smoking use included cigarettes. He has quit using smokeless tobacco.  His smokeless tobacco use included chew. He reports that he does not currently use alcohol. He reports that he does not  use drugs.    Medications Medications Prior to Admission  Medication Sig Dispense Refill   abiraterone acetate (ZYTIGA) 250 MG tablet Take 1,000 mg by mouth daily.     acetaminophen (TYLENOL) 500 MG tablet Take 1 tablet (500 mg total) by mouth every 6 (six) hours as needed for mild pain. (Patient taking differently: Take 1,000 mg by mouth every 6 (six) hours as needed for mild pain (pain score 1-3).)     albuterol (VENTOLIN HFA) 108 (90 Base) MCG/ACT inhaler Inhale 2 puffs into the lungs every 6 (six) hours as needed for wheezing or shortness of breath.     Ascorbic Acid (VITAMIN C) 1000 MG tablet Take 1,000 mg by mouth once a week.     aspirin EC 81 MG tablet Take 81 mg by mouth daily.     Camphor-Eucalyptus-Menthol (VICKS VAPORUB EX) Apply 1 Application topically daily as needed (congestion).     cyanocobalamin (,VITAMIN B-12,) 1000 MCG/ML injection Inject 1,000 mcg into the muscle every 30 (thirty) days.     Denosumab (XGEVA [place]) Inject 1 Dose into the skin every 28 (twenty-eight) days.     diclofenac Sodium (VOLTAREN) 1 % GEL Apply 1 Application topically daily as needed (pain).     hydroxypropyl methylcellulose / hypromellose (ISOPTO TEARS / GONIOVISC) 2.5 % ophthalmic solution Place 1 drop into both eyes daily as needed for dry eyes.     leuprolide, 6 Month, (ELIGARD) 45 MG injection Inject 45 mg into the skin every 6 (six) months.     Magnesium 250 MG TABS Take 250 mg by mouth once a week.     meclizine (ANTIVERT) 25 MG tablet Take 25 mg by mouth 2 (two) times daily as needed for dizziness.     melatonin 3 MG TABS tablet Take 3 mg by mouth at bedtime as needed.     Misc Natural Products (NEURIVA PO) Take 1 capsule by mouth daily.     Multiple Vitamin (MULTIVITAMIN WITH MINERALS) TABS tablet Take 1 tablet by mouth 2 (two) times a week.     Omega-3 Fatty Acids (FISH OIL) 1200 MG CAPS Take 1,200 mg by mouth daily.     oxybutynin (DITROPAN) 5 MG tablet Take 5 mg by mouth 2 (two) times daily  as needed for bladder spasms.     PARoxetine (PAXIL) 40 MG tablet Take 1 tablet (40 mg total) by mouth daily. (Patient taking differently: Take 40 mg by mouth at bedtime.) 30 tablet 11  predniSONE (DELTASONE) 5 MG tablet Take 5 mg by mouth daily.     senna-docusate (SENOKOT-S) 8.6-50 MG tablet Take 1 tablet by mouth at bedtime. (Patient taking differently: Take 2 tablets by mouth 2 (two) times daily.) 30 tablet 0   traMADol (ULTRAM) 50 MG tablet Take 50 mg by mouth 2 (two) times daily as needed.     traZODone (DESYREL) 50 MG tablet Take 50 mg by mouth at bedtime.     vitamin E 180 MG (400 UNITS) capsule Take 400 Units by mouth every Wednesday.     Zinc 50 MG TABS Take 50 mg by mouth daily.     oxyCODONE (ROXICODONE) 5 MG immediate release tablet Take 1 tablet (5 mg total) by mouth every 8 (eight) hours as needed. For post-op pain and cancer pain (Patient not taking: Reported on 06/10/2023) 20 tablet 0    EXAMINATION  Current vital signs:    06/15/2023    6:19 AM 06/14/2023   10:56 PM 06/14/2023    9:12 PM  Vitals with BMI  Systolic 170 149 161  Diastolic 105 72 83  Pulse 84 85 85    Examination:  GENERAL: Awake, alert in NAD HEENT: - Normocephalic and atraumatic, dry mm, no lymphadenopathy, no Thyromegally LUNGS - Regular, unlabored respirations CV - S1S2 RRR, equal pulses bilaterally. ABDOMEN - Soft, nontender, nondistended with normoactive BS Ext: warm, well perfused, intact peripheral pulses, no pedal edema Integumentary:  Skin intact on clothed exam  NEURO:  Mental Status: Oriented to self and family. Disoriented to situation, time, place Language: speech is clear.  Hard of hearing at baseline. No aphasia  Cranial Nerves:  II: PERRL. Visual fields full to confrontation.  III, IV, VI: EOM intact. Eyelids elevate symmetrically. Blinks to threat.  V: Sensation intact V1-3 symmetrically  VII: no facial asymmetry   VIII: Hard of hearing right IX, X: Palate elevates  symmetrically. Phonation is normal.  WR:[id number] shrug 5/5 and symmetrical  XII: tongue is midline without fasciculations. Motor:  RUE: 5/5     LUE: 5/5 RLE: 4-/5    LLE: 4-/5 Tone: is normal and bulk is normal DTRs: 1+ and symmetrical throughout   Sensation- Intact to light touch and temperature bilaterally Coordination: He does have a tremor at rest, appears more significant on the left than right. Accurate FNF and no orbiting. Unable to complete HKS Gait- deferred   LABS  I have reviewed labs in epic and the results pertinent to this consultation are:  Lab Results  Component Value Date   ALT 11 06/11/2023   AST 39 06/11/2023   ALKPHOS 225 (H) 06/11/2023   BILITOT 0.7 06/11/2023   Lab Results  Component Value Date   WBC 8.1 06/12/2023   HGB 10.3 (L) 06/12/2023   HCT 30.8 (L) 06/12/2023   MCV 91.7 06/12/2023   PLT 225 06/12/2023   Lab Results  Component Value Date   NA 129 (L) 06/14/2023   K 3.6 06/14/2023   CL 97 (L) 06/14/2023   CO2 23 06/14/2023   Lab Results  Component Value Date   TSH 27.230 (H) 06/14/2023    DIAGNOSTIC IMAGING/PROCEDURES  I have reviewed the images obtained:, as below    CT-head- No acute intracranial abnormality   MRI brain w wo- No acute intracranial pathology   EEG - This technically difficult study is suggestive of moderate diffuse encephalopathy but likely related to toxic-metabolic causes like cefepime toxicity. No seizures or definite epileptiform discharges were seen throughout the recording.  Impression:  89 y.o. male with a pertinent medical history of dementia, depression, HTN, BPH, CVA, GERD, metastatic prostate cancer,  presenting with altered mental status.   He does have a number of medical issues that can be contributing to his altered mental status including hypothyroidism, hyponatremia, dementia with hospital delirium, and infection. Given his improvement over the last day it would be reasonable to hold off on a  lumbar puncture for the time being. Additionally family does not want to pursue a lumbar puncture unless he worsens again.   Recommendations: - Continue correcting metabolic derangements - Outpatient follow up    --    **This documentation was dictated using Dragon Medical Software and may contain inadvertent errors **    NEUROHOSPITALIST ADDENDUM Performed a face to face diagnostic evaluation.   I have reviewed the contents of history and physical exam as documented by PA/ARNP/Resident and agree with above documentation.  I have discussed and formulated the above plan as documented. Edits to the note have been made as needed.  Impression/Key exam findings/Plan: Spoke with wife at bedside in detail. 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 He has no focal deficit. With his improvement, I do not think that LP would change management and is low yield. His confusion could be explained by metabolic derangements, UTI and Cefepime. Would recommend caution with Cefepime in the future.  At this time, we will signoff. He should follow up with neurology outpatient for monitoring and management of dementia.  Erick Blinks, MD [facility] [phone]   If 7pm to 7am, please call on call as listed on AMION.

## 2023-06-15 NOTE — Progress Notes (Signed)
PROGRESS NOTE Steven Cain  ZOX:[id number] DOB: [birth year] DOA: 06/10/2023 PCP: [name], MD  Brief Narrative/Hospital Course: 89 y.o.m w/ history significant of depression, htn, gi bleed, bph, COPD not on home oxygen, CVA, gerd, and metastatic prostate cancer presented to the hospital with his family for notably worsening confusion over the past 5 days.History of Pseudomonas with similar presentation who was recently treated with Cipro with no improvement. Patient on chronic prednisone. In the ED underwent CT head chest x-ray CT abdomen pelvis no acute finding, showed possible combination of atelectasis and fibrosis in the lung and diffusely sclerotic bony metastatic disease in the thoracolumbar spine thoracic and bony pelvis similar to PET scan of 01/12/23. Labs with hyponatremia 127, stable renal function LFTs, chronic anemia 12.5 g, UA with WBC 21-50 RBC more than 50 moderate LE negative nitrite, Urine culture obtained and admitted  for severe sepsis due to UTI and UTI, acute metabolic encephalopathy ON chronic baseline dementia.  Patient did not have improvement despite being on antibiotics, urine culture with pneumococcus faecalis-pansensitive  MRI brain with and without>>no acute pathology EEG>> slow generalized continuous wave, with moderate diffuse encephalopathy likely related to toxic metabolic causes like cefepime toxicity with no seizures. 11/1> mentation slowly improving    Subjective: Seen and examined, Overnight vitals/labs/events reviewed  Wife states Dr Tiburcio Pea'  mentation is much better today compared to yesterday oriented to self place people Labs pending this morning recheck S/P was still low 1.6 AST 76, hemoglobin 10.9  Assessment and Plan: Principal Problem:   Acute encephalopathy Active Problems:   Prostate cancer metastatic to bone (HCC)   Chronic bronchitis (HCC)   UTI (urinary tract infection)   Chronic kidney disease, stage 3b (HCC)   Anemia associated with  acute blood loss   Hyponatremia   Essential hypertension   Seizure (HCC)   History of CVA (cerebrovascular accident)   Sepsis secondary to UTI (HCC)   Severe sepsis POA due to UTI from E faecalis: Pansensitive bacteria, on appropriate antibiotic with ampicillin. Recent Labs  Lab 06/10/23 1327 06/11/23 0043 06/12/23 0559 06/15/23 0959  WBC 9.8 9.3 8.1 10.3    Acute toxic metabolic encephalopathy Chronic baseline dementia: Patient is resting comfortably his mentation much better today oriented to self place family. EEG with no seizure, question cefepime toxicity, MRI brain no acute finding.  Patient on high-dose thiamine, Haldol as needed.  Mentation seems to be improving continue supportive care delirium precaution.  Seen by neuro at this time holding off on LP since mentation improving  Hypothyroidism: TSH 27 and free T4 level 0.41 10/31 started Synthroid 75 mcg IV daily, transition to oral when patient improves  Hypophosphatemia: Replacing  Mild transaminitis: Monitor  Hyponatremia, likely hypovolemic: Sodium trending up slowly repeat labs today  Metastatic prostate cancer Low back pain Diffuse osseous metastasis on CT and PET scan: Holding Zytiga, continue prednisone, palliative care following continue pain control  Chronic bronchitis at baseline: Continue bronchodilators  CKD stage IIIb: Monitor renal function. Recent Labs    01/12/23 1224 01/26/23 1336 03/26/23 1043 06/10/23 1327 06/11/23 0043 06/12/23 0559 06/13/23 1322 06/14/23 0547 06/14/23 0907 06/15/23 0959  BUN 17 23 17 16 15 15 15 13 13 12   CREATININE 1.59* 1.27* 1.27* 1.24 1.05 0.98 1.03 0.94 0.88 1.04  CO2 31 29 30 24 26 24 26  20* 23 23  K 4.8 4.2 4.5 4.1 4.1 3.7 3.9 3.8 3.6 3.6    Hypertension:  Cont prn.  Anemia of malignancy: Hemoglobin stable, monitor  CVA history: Continue ASA  Constipation: Last BM 10/31.KUB 10/31-focally dilated loop of small bowel.  Goc: Palliative care  following closely, at risk of decompensation  DVT prophylaxis: enoxaparin (LOVENOX) injection 40 mg Start: 06/15/23 1500 SCDs Start: 06/10/23 2341> start chemical prophylaxis given his complex situation and decreased mobility Code Status:   Code Status: Limited: Do not attempt resuscitation (DNR) -DNR-LIMITED -Do Not Intubate/DNI  Family Communication: plan of care discussed with patient/wife  at bedside. Patient status is: Inpatient because of acute metabolic encephalopathy Level of care: Med-Surg   Dispo: The patient is from: home            Anticipated disposition: TBD  Objective: Vitals last 24 hrs: Vitals:   06/14/23 1329 06/14/23 2112 06/14/23 2256 06/15/23 0619  BP: 126/64 (!) 193/83 (!) 149/72 (!) 170/105  Pulse: 75 85 85 84  Resp: 18     Temp: (!) 97.4 F (36.3 C) 98.1 F (36.7 C) (!) 97.5 F (36.4 C) 98 F (36.7 C)  TempSrc: Oral Oral Oral Oral  SpO2: 90% 96% 96%   Weight:      Height:       Weight change:   Physical Examination: General exam: alert awake, oriented x 2 , frail, older than stated age HEENT:Oral mucosa moist, Ear/Nose WNL grossly Respiratory system: bilaterally CLEAR BS, no use of accessory muscle Cardiovascular system: S1 & S2 +, No JVD. Gastrointestinal system: Abdomen soft,NT,ND, BS+ Nervous System:Alert, awake, moving extremities. Extremities: LE edema neg, ?  Mottling discoloration of the lower extremities, distal peripheral pulses palpable.  Skin: No rashes,no icterus. MSK: Normal muscle bulk,tone, power  Medications reviewed:  Scheduled Meds:  acetaminophen  650 mg Oral TID   [START ON 06/16/2023] aspirin  81 mg Oral Daily   enoxaparin (LOVENOX) injection  40 mg Subcutaneous Q24H   feeding supplement  237 mL Oral BID BM   influenza vaccine adjuvanted  0.5 mL Intramuscular Tomorrow-1000   levothyroxine  75 mcg Intravenous Daily   polyethylene glycol  17 g Oral BID   predniSONE  5 mg Oral Daily   senna-docusate  2 tablet Oral BID   Continuous Infusions:  ampicillin (OMNIPEN) IV 1 g (06/15/23 1131)   thiamine (VITAMIN B1) injection 500 mg (06/15/23 1016)    Diet Order             Diet Heart Room service appropriate? Yes; Fluid consistency: Thin  Diet effective now                 Nutrition Problem: Increased nutrient needs Etiology: cancer and cancer related treatments Signs/Symptoms: estimated needs Interventions: Ensure Enlive (each supplement provides 350kcal and 20 grams of protein)   Intake/Output Summary (Last 24 hours) at 06/15/2023 1403 Last data filed at 06/15/2023 1000 Gross per 24 hour  Intake 1129.98 ml  Output 400 ml  Net 729.98 ml   Net IO Since Admission: -969.25 mL [06/15/23 1403]  Wt Readings from Last 3 Encounters:  06/10/23 70.7 kg  03/26/23 71.2 kg  01/26/23 71.8 kg     Unresulted Labs (From admission, onward)     Start     Ordered   06/22/23 0500  Creatinine, serum  (enoxaparin (LOVENOX)    CrCl >/= 30 ml/min)  Weekly,   R     Comments: while on enoxaparin therapy   Question:  Specimen collection method  Answer:  Lab=Lab collect   06/15/23 1403   06/14/23 1437  CSF cell count with differential  Once,   R  Question:  Are there also cytology or pathology orders on this specimen?  Answer:  No   06/14/23 1437   06/14/23 1437  CSF culture w Gram Stain  Once,   R       Question:  Are there also cytology or pathology orders on this specimen?  Answer:  No   06/14/23 1437   06/14/23 1437  Meningitis/Encephalitis Panel (CSF)  Once,   R        06/14/23 1437   06/14/23 1437  Protein and glucose, CSF  Once,   R        06/14/23 1437   06/14/23 1437  VDRL, CSF  Once,   R        06/14/23 1437   06/14/23 1437  VZV PCR, CSF  Once,   R        06/14/23 1437   06/14/23 1437  Cryptococcal antigen, CSF  Once,   R        06/14/23 1437   06/14/23 1437  JC virus, PRC CSF  Once,   R        06/14/23 1437   06/12/23 0500  Basic metabolic panel  Every 48 hours,   R     Question:  Specimen  collection method  Answer:  Lab=Lab collect   06/11/23 1623          Data Reviewed: I have personally reviewed following labs and imaging studies CBC: Recent Labs  Lab 06/10/23 1327 06/11/23 0043 06/12/23 0559 06/15/23 0959  WBC 9.8 9.3 8.1 10.3  HGB 12.5* 11.2* 10.3* 10.9*  HCT 37.5* 33.2* 30.8* 32.9*  MCV 89.3 91.2 91.7 91.9  PLT 278 247 225 293   Basic Metabolic Panel: Recent Labs  Lab 06/11/23 0043 06/12/23 0559 06/13/23 1322 06/14/23 0547 06/14/23 0907 06/15/23 0959  NA 124* 126* 127* 127* 129* 129*  K 4.1 3.7 3.9 3.8 3.6 3.6  CL 90* 93* 94* 98 97* 95*  CO2 26 24 26  20* 23 23  GLUCOSE 104* 92 108* 82 85 119*  BUN 15 15 15 13 13 12   CREATININE 1.05 0.98 1.03 0.94 0.88 1.04  CALCIUM 8.2* 8.0* 8.1* 7.7* 7.9* 7.5*  MG 2.2  --   --   --  2.3 2.2  PHOS 1.8*  --   --   --  1.4* 1.6*   GFR: Estimated Creatinine Clearance: 41.7 mL/min (by C-G formula based on SCr of 1.04 mg/dL). Liver Function Tests: Recent Labs  Lab 06/10/23 1327 06/11/23 0043 06/15/23 0959  AST 38 39 76*  ALT 11 11 18   ALKPHOS 238* 225* 231*  BILITOT 0.8 0.7 0.8  PROT 7.5 6.7 6.0*  ALBUMIN 3.3* 3.0* 2.6*   No results for input(s): "LIPASE", "AMYLASE" in the last 168 hours. Recent Labs  Lab 06/11/23 0043  AMMONIA 17  Coagulation Profile: Recent Labs  Lab 06/11/23 0043  INR 1.0  Thyroid Function Tests: Recent Labs    06/14/23 0547 06/14/23 0907  TSH 27.230*  --   FREET4  --  0.41*   Sepsis Labs: No results for input(s): "PROCALCITON", "LATICACIDVEN" in the last 168 hours.  Recent Results (from the past 240 hour(s))  Urine Culture     Status: Abnormal   Collection Time: 06/10/23  4:53 PM   Specimen: Urine, Clean Catch  Result Value Ref Range Status   Specimen Description   Final    URINE, CLEAN CATCH Performed at [facility], [address] [place],  [place] [address]    Special Requests   Final    NONE Performed at [facility], [address] [place], [place] [address]    Culture (A)  Final    50,000 COLONIES/mL ENTEROCOCCUS FAECALIS WITHIN MIXED CULTURE Performed at [facility], [address], [place], [place] [address]    Report Status 06/13/2023 FINAL  Final   Organism ID, Bacteria ENTEROCOCCUS FAECALIS (A)  Final      Susceptibility   Enterococcus faecalis - MIC*    AMPICILLIN <=2 SENSITIVE Sensitive     NITROFURANTOIN <=16 SENSITIVE Sensitive     VANCOMYCIN <=0.5 SENSITIVE Sensitive     * 50,000 COLONIES/mL ENTEROCOCCUS FAECALIS  Urine Culture (for pregnant, neutropenic or urologic patients or patients with an indwelling urinary catheter)     Status: Abnormal   Collection Time: 06/11/23  2:14 AM   Specimen: Urine, Clean Catch  Result Value Ref Range Status   Specimen Description   Final    URINE, CLEAN CATCH Performed at [facility], [address] [place], [place] [address]    Special Requests   Final    NONE Performed at [facility], [address] [place], [place] [address]    Culture (A)  Final    <10,000 COLONIES/mL INSIGNIFICANT GROWTH Performed at [facility], [address] [place], [place] [address]    Report Status 06/12/2023 FINAL  Final  Culture, blood (Routine X 2) w Reflex to ID Panel     Status: None (Preliminary result)   Collection Time: 06/13/23  2:52 PM   Specimen: BLOOD  Result Value Ref Range Status   Specimen Description   Final    BLOOD BLOOD LEFT ARM Performed at [facility], [address] [place], [place] [address]    Special Requests   Final    BOTTLES DRAWN AEROBIC ONLY Blood Culture results may not be optimal due to an inadequate volume of blood received in culture bottles Performed at [facility], [address] [place], [place] [address]    Culture   Final    NO GROWTH 2 DAYS Performed at [facility], [address], [place], [place] [address]    Report Status  PENDING  Incomplete  Culture, blood (Routine X 2) w Reflex to ID Panel     Status: None (Preliminary result)   Collection Time: 06/13/23  2:52 PM   Specimen: BLOOD  Result Value Ref Range Status   Specimen Description   Final    BLOOD BLOOD LEFT HAND Performed at [facility], [address] [place], [place] [address]    Special Requests   Final    BOTTLES DRAWN AEROBIC ONLY Blood Culture results may not be optimal due to an inadequate volume of blood received in culture bottles Performed at [facility], [address], [place], [place] [address]    Culture   Final    NO GROWTH 2 DAYS Performed at [facility], [address] [place], [place] [address]    Report Status PENDING  Incomplete    Antimicrobials: Anti-infectives (From admission, onward)    Start     Dose/Rate Route Frequency Ordered Stop   06/13/23 1400  ampicillin (OMNIPEN) 1 g in sodium chloride 0.9 % 100 mL IVPB        1 g 300 mL/hr over 20 Minutes Intravenous Every 6 hours 06/13/23 1313  06/10/23 1845  ceFEPIme (MAXIPIME) 2 g in sodium chloride 0.9 % 100 mL IVPB        2 g 200 mL/hr over 30 Minutes Intravenous Every 12 hours 06/10/23 1833 06/13/23 0622      Culture/Microbiology    Component Value Date/Time   SDES  06/13/2023 1452    BLOOD BLOOD LEFT ARM Performed at [facility], [address], [place], [place] [address]    SDES  06/13/2023 1452    BLOOD BLOOD LEFT HAND Performed at [facility], [address] [place], [place] [address]    SPECREQUEST  06/13/2023 1452    BOTTLES DRAWN AEROBIC ONLY Blood Culture results may not be optimal due to an inadequate volume of blood received in culture bottles Performed at [facility], [address], [place], [place] [address]    SPECREQUEST  06/13/2023 1452    BOTTLES DRAWN AEROBIC ONLY Blood Culture results may not be optimal due to an inadequate  volume of blood received in culture bottles Performed at [facility], [address], [place], [place] [address]    CULT  06/13/2023 1452    NO GROWTH 2 DAYS Performed at [facility], [address], [place], [place] [address]    CULT  06/13/2023 1452    NO GROWTH 2 DAYS Performed at [facility], [address], [place], [place] [address]    REPTSTATUS PENDING 06/13/2023 1452   REPTSTATUS PENDING 06/13/2023 1452   Radiology Studies: DG Abd 1 View  Result Date: 06/14/2023 CLINICAL DATA:  [id number] Abdominal pain [id number] EXAM: ABDOMEN - 1 VIEW COMPARISON:  CT AP 06/10/23 FINDINGS: Left-sided double-J ureteral stent in place. Focally dilated loop of small bowel in the right lower quadrant is nonspecific. No pneumatosis. No supine evidence of pneumoperitoneum. Likely bibasilar atelectasis severe degenerative changes of the right hip joint. Left hip arthroplasty. Severe degenerative changes in the lumbar spine. IMPRESSION: Focally dilated loop of small bowel in the right lower quadrant is nonspecific. If there is high clinical concern for an acute abdominal process, further evaluation with a CT of the abdomen and pelvis could be considered. Electronically Signed   By: Lorenza Cambridge M.D.   On: 06/14/2023 14:58   MR BRAIN W WO CONTRAST  Result Date: 06/14/2023 CLINICAL DATA:  Altered mental status EXAM: MRI HEAD WITHOUT AND WITH CONTRAST TECHNIQUE: Multiplanar, multiecho pulse sequences of the brain and surrounding structures were obtained without and with intravenous contrast. CONTRAST:  7mL GADAVIST GADOBUTROL 1 MMOL/ML IV SOLN COMPARISON:  Head CT 06/10/2023 FINDINGS: Brain: There is no acute intracranial hemorrhage, extra-axial fluid collection, or acute infarct. Parenchymal volume is within expected limits for age. The ventricles are normal in size. There is mild background chronic small-vessel ischemic change in the supratentorial white matter. There are small  remote infarcts in the right thalamus and right cerebellar hemisphere. The pituitary and suprasellar region are normal. There is no mass lesion or abnormal enhancement. There is no mass effect or midline shift. Vascular: Normal flow voids. Skull and upper cervical spine: Normal marrow signal. Sinuses/Orbits: The paranasal sinuses are essentially clear. The globes and orbits are unremarkable. Other: The mastoid air cells and middle ear cavities are clear. IMPRESSION: No acute intracranial pathology. Electronically Signed   By: Lesia Hausen M.D.   On: 06/14/2023 13:11   EEG adult  Result Date: 06/13/2023 Charlsie Quest, MD     06/13/2023  4:38 PM Patient Name:  JAMAINE QUINTIN MRN: [id number] Epilepsy Attending: Charlsie Quest Referring Physician/Provider: Kathrynn Running, MD Date: 06/13/2023 Duration: 25.35 mins Patient history: 89yo m with ams getting eeg to evaluate for seizure Level of alertness: Awake AEDs during EEG study: None Technical aspects: This EEG study was done with scalp electrodes positioned according to the 10-20 International system of electrode placement. Electrical activity was reviewed with band pass filter of 1-70Hz , sensitivity of 7 uV/mm, display speed of 69mm/sec with a 60Hz  notched filter applied as appropriate. EEG data were recorded continuously and digitally stored.  Video monitoring was available and reviewed as appropriate. Description: EEG showed continuous generalized 3 to 5  Hz theta-delta slowing. Generalized periodic discharges with triphasic morphology at 1- 1.5 Hz were also noted. Hyperventilation and photic stimulation were not performed.   Of note, study was technically difficult due to significant movement and electrode artifact. ABNORMALITY - Periodic discharges with triphasic morphology, generalized ( GPDs) - Continuous slow, generalized IMPRESSION: This technically difficult study is suggestive of moderate diffuse encephalopathy but likely related to  toxic-metabolic causes like cefepime toxicity. No seizures or definite epileptiform discharges were seen throughout the recording. Charlsie Quest     LOS: 4 days   Lanae Boast, MD [facility]  06/15/2023, 2:03 PM

## 2023-06-15 NOTE — Plan of Care (Signed)
  Problem: Clinical Measurements: Goal: Ability to maintain clinical measurements within normal limits will improve Outcome: Progressing Goal: Diagnostic test results will improve Outcome: Progressing   

## 2023-06-16 DIAGNOSIS — G9340 Encephalopathy, unspecified: Secondary | ICD-10-CM | POA: Diagnosis not present

## 2023-06-16 LAB — BASIC METABOLIC PANEL
Anion gap: 9 (ref 5–15)
BUN: 13 mg/dL (ref 8–23)
CO2: 25 mmol/L (ref 22–32)
Calcium: 7.4 mg/dL — ABNORMAL LOW (ref 8.9–10.3)
Chloride: 92 mmol/L — ABNORMAL LOW (ref 98–111)
Creatinine, Ser: 1.08 mg/dL (ref 0.61–1.24)
GFR, Estimated: >60 mL/min (ref >60)
Glucose, Bld: 94 mg/dL (ref 70–99)
Potassium: 3.6 mmol/L (ref 3.5–5.1)
Sodium: 126 mmol/L — ABNORMAL LOW (ref 135–145)

## 2023-06-16 LAB — SODIUM, URINE, RANDOM: Sodium, Ur: 26 mmol/L

## 2023-06-16 LAB — PHOSPHORUS
Phosphorus: 1.3 mg/dL — ABNORMAL LOW (ref 2.5–4.6)
Phosphorus: 2 mg/dL — ABNORMAL LOW (ref 2.5–4.6)

## 2023-06-16 LAB — OSMOLALITY, URINE: Osmolality, Ur: 436 mosm/kg (ref 300–900)

## 2023-06-16 MED ORDER — SODIUM CHLORIDE 0.9 % IV SOLN: INTRAVENOUS | Status: AC

## 2023-06-16 MED ORDER — SODIUM CHLORIDE 1 G PO TABS
1.0000 g | ORAL_TABLET | Freq: Three times a day (TID) | ORAL | Status: DC
  Administered 2023-06-16 – 2023-06-20 (×13): 1 g via ORAL
  Filled 2023-06-16 (×13): qty 1

## 2023-06-16 MED ORDER — POTASSIUM PHOSPHATES 15 MMOLE/5ML IV SOLN
30.0000 mmol | Freq: Once | INTRAVENOUS | Status: AC
  Administered 2023-06-16: 30 mmol via INTRAVENOUS
  Filled 2023-06-16: qty 10

## 2023-06-16 NOTE — Plan of Care (Signed)
  Problem: Clinical Measurements: Goal: Ability to maintain clinical measurements within normal limits will improve Outcome: Progressing Goal: Will remain free from infection Outcome: Progressing Goal: Diagnostic test results will improve Outcome: Progressing Goal: Respiratory complications will improve Outcome: Progressing Goal: Cardiovascular complication will be avoided Outcome: Progressing   Problem: Nutrition: Goal: Adequate nutrition will be maintained Outcome: Progressing   Problem: Elimination: Goal: Will not experience complications related to bowel motility Outcome: Progressing Goal: Will not experience complications related to urinary retention Outcome: Progressing   Problem: Pain Management: Goal: General experience of comfort will improve Outcome: Progressing   Problem: Safety: Goal: Ability to remain free from injury will improve Outcome: Progressing   Problem: Skin Integrity: Goal: Risk for impaired skin integrity will decrease Outcome: Progressing

## 2023-06-16 NOTE — Plan of Care (Signed)
IR/Radiology was requested for image guided LP.   Chart reviewed, hold off fluoro guided LP as patient's AMS improving pet neurology.   Will discontinue  the fluoro guided LP order.  Please re-order if patient's condition worsens and LP is needed.   Please call diagnostic radiology (not IR)  for questions and concerns regarding LP, LP is done with DR, not IR.  Lynann Bologna Treazure Nery PA-C 06/16/2023 9:11 AM

## 2023-06-16 NOTE — Progress Notes (Signed)
[facility] 5C16 [facility] Liaison Note   [facility] continues to follow for discharge disposition for hospice services at home vs LTC.   Please call with any hospice related questions or concerns. Thea Gist, BSN RN Hospice hospital liaison [phone]

## 2023-06-16 NOTE — Progress Notes (Signed)
PROGRESS NOTE Steven Cain  ZOX:[id number] DOB: 15-Oct-1930 DOA: 06/10/2023 PCP: [name], MD  Brief Narrative/Hospital Course: 89 y.o.m w/ history significant of depression, htn, gi bleed, bph, COPD not on home oxygen, CVA, gerd, and metastatic prostate cancer presented to the hospital with his family for notably worsening confusion over the past 5 days.History of Pseudomonas with similar presentation who was recently treated with Cipro with no improvement. Patient on chronic prednisone. In the ED underwent CT head chest x-ray CT abdomen pelvis no acute finding, showed possible combination of atelectasis and fibrosis in the lung and diffusely sclerotic bony metastatic disease in the thoracolumbar spine thoracic and bony pelvis similar to PET scan of 01/12/23. Labs with hyponatremia 127, stable renal function LFTs, chronic anemia 12.5 g, UA with WBC 21-50 RBC more than 50 moderate LE negative nitrite, Urine culture obtained and admitted  for severe sepsis due to UTI and UTI, acute metabolic encephalopathy ON chronic baseline dementia.  Patient did not have improvement despite being on antibiotics, urine culture with pneumococcus faecalis-pansensitive  MRI brain with and without>>no acute pathology EEG>> slow generalized continuous wave, with moderate diffuse encephalopathy likely related to toxic metabolic causes like cefepime toxicity with no seizures. 11/1> mentation much better patient is able to converse oriented to self family-seen by neurology and LP deferred    Subjective: Seen and examined this morning wife at the bedside He was sleeping but did wake up able to tell me his name current location and current president name Wife thinks he is improving slowly Blood pressure borderline controlled 160s to 180s, afebrile Labs shows sodium continues to be on lower side 126 from 129 Nursing concerned that he has not been tolerating well and having frequent bowel movement laxatives  discontinued  Assessment and Plan: Principal Problem:   Acute encephalopathy Active Problems:   Prostate cancer metastatic to bone (HCC)   Chronic bronchitis (HCC)   UTI (urinary tract infection)   Chronic kidney disease, stage 3b (HCC)   Anemia associated with acute blood loss   Hyponatremia   Essential hypertension   Seizure (HCC)   History of CVA (cerebrovascular accident)   Sepsis secondary to UTI (HCC)   Severe sepsis POA due to UTI from E faecalis: Pansensitive bacteria-on appropriate antibiotic with ampicillin.  He is on chronic prednisone and immunocompromised Recent Labs  Lab 06/10/23 1327 06/11/23 0043 06/12/23 0559 06/15/23 0959  WBC 9.8 9.3 8.1 10.3    Acute toxic metabolic encephalopathy Chronic baseline dementia: Likely multifactorial in the setting of UTI/sepsis, hypothyroidism/?  Cefepime toxicity. Patient mentation started to improve from 11/1.  Workup so far-EEG with no seizure, question cefepime toxicity, MRI brain no acute finding. Patient being managed w/ high-dose thiamine, finding of hypothyroidism with TSH 27 and free T40.4 And placed on thyroid supplement. Seen by neuro and LP deferred since mentation was improving.  Hypothyroidism: TSH 27 and free T4 level 0.41. 10/31 started Synthroid 75 mcg IV daily will transition to po soon  Hypophosphatemia: Replaced.recheck  Mild transaminitis: Monitor  Hyponatremia, likely hypovolemic: Sodium has been fluctuating, but having frequent bowel movement laxative discontinued likely hypovolemic as as oral intake is also not great-start gentle IV fluids today, ordered urine sodium and osmole. Has hypothyroidism which could be contributing.  Add salt tab.  Continue to trend labs and reassess ivf need during national shortage. Recent Labs  Lab 06/13/23 1322 06/14/23 0547 06/14/23 0907 06/15/23 0959 06/16/23 0458  NA 127* 127* 129* 129* 126*    Metastatic prostate cancer  Low back pain Diffuse osseous  metastasis on CT and PET scan: Holding Zytiga, continue prednisone, palliative care following continue pain control  Chronic bronchitis at baseline: Continue bronchodilators  CKD stage IIIb: Remains stable at baseline see below. Recent Labs    01/26/23 1336 03/26/23 1043 06/10/23 1327 06/11/23 0043 06/12/23 0559 06/13/23 1322 06/14/23 0547 06/14/23 0907 06/15/23 0959 06/16/23 0458  BUN 23 17 16 15 15 15 13 13 12 13   CREATININE 1.27* 1.27* 1.24 1.05 0.98 1.03 0.94 0.88 1.04 1.08  CO2 29 30 24 26 24 26  20* 23 23 25   K 4.2 4.5 4.1 4.1 3.7 3.9 3.8 3.6 3.6 3.6    Hypertension: BP stable   Anemia of malignancy: Hemoglobin stable, monitor Recent Labs  Lab 06/10/23 1327 06/11/23 0043 06/12/23 0559 06/15/23 0959  HGB 12.5* 11.2* 10.3* 10.9*  HCT 37.5* 33.2* 30.8* 32.9*   LE discoloration/Mottling: Will order duplex and ABI leg   CVA history: Continue ASA  Frequent bowel movement Constipation: Initially constipated given laxative now having frequent bowel movement but no diarrhea, discontinued MiraLAX and senna   Goc: Palliative care following closely, at risk of decompensation.  Continue DNR  DVT prophylaxis: enoxaparin (LOVENOX) injection 40 mg Start: 06/15/23 1500 SCDs Start: 06/10/23 2341> start chemical prophylaxis given his complex situation and decreased mobility Code Status:   Code Status: Limited: Do not attempt resuscitation (DNR) -DNR-LIMITED -Do Not Intubate/DNI  Family Communication: plan of care discussed with patient/wife  at bedside.  All questions answered offered to call her daughter if she has any questions but patient and wife does not have any questions  Patient status is: Inpatient because of acute metabolic encephalopathy Level of care: Med-Surg   Dispo: The patient is from: home            Anticipated disposition: TBD.  Mobilizing with PT OT no PT need  Objective: Vitals last 24 hrs: Vitals:   06/15/23 1406 06/15/23 1427 06/15/23 2141  06/16/23 0524  BP: (!) 181/85 (!) 181/85 (!) 183/81 (!) 164/78  Pulse: 86  87 83  Resp: 16  18 20   Temp: 98.6 F (37 C)  98.2 F (36.8 C) 97.7 F (36.5 C)  TempSrc: Oral  Oral Oral  SpO2: 97%  94% 97%  Weight:      Height:       Weight change:   Physical Examination: General exam: Sleepy but able to wake up and oriented to self place people HEENT:Oral mucosa moist, Ear/Nose WNL grossly Respiratory system: Bilaterally clear BS,no use of accessory muscle Cardiovascular system: S1 & S2 +, No JVD. Gastrointestinal system: Abdomen soft,NT,ND, BS+ Nervous System: Alert, awake, moving all extremities,and following commands. Extremities: Discolored lower extremities  on skin Skin: No rashes,no icterus. MSK: Normal muscle bulk,tone, power   Medications reviewed:  Scheduled Meds:  acetaminophen  650 mg Oral TID   aspirin  81 mg Oral Daily   enoxaparin (LOVENOX) injection  40 mg Subcutaneous Q24H   feeding supplement  237 mL Oral BID BM   influenza vaccine adjuvanted  0.5 mL Intramuscular Tomorrow-1000   levothyroxine  75 mcg Intravenous Daily   predniSONE  5 mg Oral Daily   sodium chloride  1 g Oral TID WC  Continuous Infusions:  sodium chloride     ampicillin (OMNIPEN) IV Stopped (06/16/23 0549)    Diet Order             Diet Heart Room service appropriate? Yes; Fluid consistency: Thin  Diet effective now  Nutrition Problem: Increased nutrient needs Etiology: cancer and cancer related treatments Signs/Symptoms: estimated needs Interventions: Ensure Enlive (each supplement provides 350kcal and 20 grams of protein)   Intake/Output Summary (Last 24 hours) at 06/16/2023 1140 Last data filed at 06/16/2023 1610 Gross per 24 hour  Intake 610 ml  Output 750 ml  Net -140 ml   Net IO Since Admission: -1,109.25 mL [06/16/23 1140]  Wt Readings from Last 3 Encounters:  06/10/23 70.7 kg  03/26/23 71.2 kg  01/26/23 71.8 kg     Unresulted Labs (From  admission, onward)     Start     Ordered   06/22/23 0500  Creatinine, serum  (enoxaparin (LOVENOX)    CrCl >/= 30 ml/min)  Weekly,   R     Comments: while on enoxaparin therapy   Question:  Specimen collection method  Answer:  Lab=Lab collect   06/15/23 1403   06/17/23 0500  Basic metabolic panel  Daily,   R     Question:  Specimen collection method  Answer:  Lab=Lab collect   06/16/23 0857   06/17/23 0500  CBC  Daily,   R     Question:  Specimen collection method  Answer:  Lab=Lab collect   06/16/23 0906   06/16/23 1041  Phosphorus  Add-on,   AD       Question:  Specimen collection method  Answer:  Lab=Lab collect   06/16/23 1040   06/16/23 0858  Sodium, urine, random  Once,   R        06/16/23 0857   06/16/23 0858  Osmolality, urine  Once,   R        06/16/23 0857   06/14/23 1437  CSF culture w Gram Stain  Once,   R       Question:  Are there also cytology or pathology orders on this specimen?  Answer:  No   06/14/23 1437   06/14/23 1437  VZV PCR, CSF  Once,   R        06/14/23 1437          Data Reviewed: I have personally reviewed following labs and imaging studies CBC: Recent Labs  Lab 06/10/23 1327 06/11/23 0043 06/12/23 0559 06/15/23 0959  WBC 9.8 9.3 8.1 10.3  HGB 12.5* 11.2* 10.3* 10.9*  HCT 37.5* 33.2* 30.8* 32.9*  MCV 89.3 91.2 91.7 91.9  PLT 278 247 225 293   Basic Metabolic Panel: Recent Labs  Lab 06/11/23 0043 06/12/23 0559 06/13/23 1322 06/14/23 0547 06/14/23 0907 06/15/23 0959 06/16/23 0458  NA 124*   < > 127* 127* 129* 129* 126*  K 4.1   < > 3.9 3.8 3.6 3.6 3.6  CL 90*   < > 94* 98 97* 95* 92*  CO2 26   < > 26 20* 23 23 25   GLUCOSE 104*   < > 108* 82 85 119* 94  BUN 15   < > 15 13 13 12 13   CREATININE 1.05   < > 1.03 0.94 0.88 1.04 1.08  CALCIUM 8.2*   < > 8.1* 7.7* 7.9* 7.5* 7.4*  MG 2.2  --   --   --  2.3 2.2  --   PHOS 1.8*  --   --   --  1.4* 1.6*  --    < > = values in this interval not displayed.   GFR: Estimated Creatinine  Clearance: 40.1 mL/min (by C-G formula based on SCr of 1.08 mg/dL). Liver Function Tests: Recent  Labs  Lab 06/10/23 1327 06/11/23 0043 06/15/23 0959  AST 38 39 76*  ALT 11 11 18   ALKPHOS 238* 225* 231*  BILITOT 0.8 0.7 0.8  PROT 7.5 6.7 6.0*  ALBUMIN 3.3* 3.0* 2.6*   No results for input(s): "LIPASE", "AMYLASE" in the last 168 hours. Recent Labs  Lab 06/11/23 0043  AMMONIA 17  Coagulation Profile: Recent Labs  Lab 06/11/23 0043  INR 1.0  Thyroid Function Tests: Recent Labs    06/14/23 0547 06/14/23 0907  TSH 27.230*  --   FREET4  --  0.41*   Sepsis Labs: No results for input(s): "PROCALCITON", "LATICACIDVEN" in the last 168 hours.  Recent Results (from the past 240 hour(s))  Urine Culture     Status: Abnormal   Collection Time: 06/10/23  4:53 PM   Specimen: Urine, Clean Catch  Result Value Ref Range Status   Specimen Description   Final    URINE, CLEAN CATCH Performed at [facility], [address] [place], [place] [address]    Special Requests   Final    NONE Performed at [facility], [address] [place], [place] [address]    Culture (A)  Final    50,000 COLONIES/mL ENTEROCOCCUS FAECALIS WITHIN MIXED CULTURE Performed at [facility], [address], [place], [place] [address]    Report Status 06/13/2023 FINAL  Final   Organism ID, Bacteria ENTEROCOCCUS FAECALIS (A)  Final      Susceptibility   Enterococcus faecalis - MIC*    AMPICILLIN <=2 SENSITIVE Sensitive     NITROFURANTOIN <=16 SENSITIVE Sensitive     VANCOMYCIN <=0.5 SENSITIVE Sensitive     * 50,000 COLONIES/mL ENTEROCOCCUS FAECALIS  Urine Culture (for pregnant, neutropenic or urologic patients or patients with an indwelling urinary catheter)     Status: Abnormal   Collection Time: 06/11/23  2:14 AM   Specimen: Urine, Clean Catch  Result Value Ref Range Status   Specimen Description   Final    URINE, CLEAN CATCH Performed at [facility],  [address] [place], [place] [address]    Special Requests   Final    NONE Performed at [facility], [address], [place], [place] [address]    Culture (A)  Final    <10,000 COLONIES/mL INSIGNIFICANT GROWTH Performed at [facility], [address] [place], [place] [address]    Report Status 06/12/2023 FINAL  Final  Culture, blood (Routine X 2) w Reflex to ID Panel     Status: None (Preliminary result)   Collection Time: 06/13/23  2:52 PM   Specimen: BLOOD  Result Value Ref Range Status   Specimen Description   Final    BLOOD BLOOD LEFT ARM Performed at [facility], [address], [place], [place] [address]    Special Requests   Final    BOTTLES DRAWN AEROBIC ONLY Blood Culture results may not be optimal due to an inadequate volume of blood received in culture bottles Performed at [facility], [address], [place], [place] [address]    Culture   Final    NO GROWTH 3 DAYS Performed at [facility], [address], [place], [place] [address]    Report Status PENDING  Incomplete  Culture, blood (Routine X 2) w Reflex to ID Panel     Status: None (Preliminary result)   Collection Time: 06/13/23  2:52 PM  Specimen: BLOOD  Result Value Ref Range Status   Specimen Description   Final    BLOOD BLOOD LEFT HAND Performed at [facility], [address] [place], [place] [address]    Special Requests   Final    BOTTLES DRAWN AEROBIC ONLY Blood Culture results may not be optimal due to an inadequate volume of blood received in culture bottles Performed at [facility], [address], [place], [place] [address]    Culture   Final    NO GROWTH 3 DAYS Performed at [facility], [address], [place], [place] [address]    Report Status PENDING  Incomplete    Antimicrobials: Anti-infectives (From admission, onward)    Start     Dose/Rate Route Frequency  Ordered Stop   06/13/23 1400  ampicillin (OMNIPEN) 1 g in sodium chloride 0.9 % 100 mL IVPB        1 g 300 mL/hr over 20 Minutes Intravenous Every 6 hours 06/13/23 1313     06/10/23 1845  ceFEPIme (MAXIPIME) 2 g in sodium chloride 0.9 % 100 mL IVPB        2 g 200 mL/hr over 30 Minutes Intravenous Every 12 hours 06/10/23 1833 06/13/23 0622      Culture/Microbiology    Component Value Date/Time   SDES  06/13/2023 1452    BLOOD BLOOD LEFT ARM Performed at [facility], [address], [place], [place] [address]    SDES  06/13/2023 1452    BLOOD BLOOD LEFT HAND Performed at [facility], [address] [place], [place] [address]    SPECREQUEST  06/13/2023 1452    BOTTLES DRAWN AEROBIC ONLY Blood Culture results may not be optimal due to an inadequate volume of blood received in culture bottles Performed at [facility], [address] [place], [place] [address]    SPECREQUEST  06/13/2023 1452    BOTTLES DRAWN AEROBIC ONLY Blood Culture results may not be optimal due to an inadequate volume of blood received in culture bottles Performed at [facility], [address] [place], [place] [address]    CULT  06/13/2023 1452    NO GROWTH 3 DAYS Performed at [facility], [address], [place], [place] [address]    CULT  06/13/2023 1452    NO GROWTH 3 DAYS Performed at [facility], [address], [place], [place] [address]    REPTSTATUS PENDING 06/13/2023 1452   REPTSTATUS PENDING 06/13/2023 1452   Radiology Studies: DG Abd 1 View  Result Date: 06/14/2023 CLINICAL DATA:  [id number] Abdominal pain [id number] EXAM: ABDOMEN - 1 VIEW COMPARISON:  CT AP 06/10/23 FINDINGS: Left-sided double-J ureteral stent in place. Focally dilated loop of small bowel in the right lower quadrant is nonspecific. No pneumatosis. No supine evidence of pneumoperitoneum. Likely bibasilar atelectasis severe degenerative changes  of the right hip joint. Left hip arthroplasty. Severe degenerative changes in the lumbar spine. IMPRESSION: Focally dilated loop of small bowel in the right lower quadrant is nonspecific. If there is high clinical concern for an acute abdominal process, further evaluation with a CT of the abdomen and pelvis could be considered. Electronically Signed   By: Lorenza Cambridge M.D.   On: 06/14/2023 14:58   MR BRAIN W WO CONTRAST  Result Date: 06/14/2023 CLINICAL DATA:  Altered mental status EXAM: MRI HEAD WITHOUT AND WITH CONTRAST TECHNIQUE: Multiplanar, multiecho pulse sequences of the brain  and surrounding structures were obtained without and with intravenous contrast. CONTRAST:  7mL GADAVIST GADOBUTROL 1 MMOL/ML IV SOLN COMPARISON:  Head CT 06/10/2023 FINDINGS: Brain: There is no acute intracranial hemorrhage, extra-axial fluid collection, or acute infarct. Parenchymal volume is within expected limits for age. The ventricles are normal in size. There is mild background chronic small-vessel ischemic change in the supratentorial white matter. There are small remote infarcts in the right thalamus and right cerebellar hemisphere. The pituitary and suprasellar region are normal. There is no mass lesion or abnormal enhancement. There is no mass effect or midline shift. Vascular: Normal flow voids. Skull and upper cervical spine: Normal marrow signal. Sinuses/Orbits: The paranasal sinuses are essentially clear. The globes and orbits are unremarkable. Other: The mastoid air cells and middle ear cavities are clear. IMPRESSION: No acute intracranial pathology. Electronically Signed   By: Lesia Hausen M.D.   On: 06/14/2023 13:11     LOS: 5 days   Lanae Boast, MD [facility]  06/16/2023, 11:40 AM

## 2023-06-17 ENCOUNTER — Inpatient Hospital Stay (HOSPITAL_COMMUNITY): Payer: Medicare Other

## 2023-06-17 DIAGNOSIS — M7989 Other specified soft tissue disorders: Secondary | ICD-10-CM | POA: Diagnosis not present

## 2023-06-17 DIAGNOSIS — G9340 Encephalopathy, unspecified: Secondary | ICD-10-CM | POA: Diagnosis not present

## 2023-06-17 LAB — CBC
HCT: 29.9 % — ABNORMAL LOW (ref 39.0–52.0)
Hemoglobin: 9.8 g/dL — ABNORMAL LOW (ref 13.0–17.0)
MCH: 30.3 pg (ref 26.0–34.0)
MCHC: 32.8 g/dL (ref 30.0–36.0)
MCV: 92.6 fL (ref 80.0–100.0)
Platelets: 287 10*3/uL (ref 150–400)
RBC: 3.23 MIL/uL — ABNORMAL LOW (ref 4.22–5.81)
RDW: 15.8 % — ABNORMAL HIGH (ref 11.5–15.5)
WBC: 9.9 10*3/uL (ref 4.0–10.5)
nRBC: 0.2 % (ref 0.0–0.2)

## 2023-06-17 LAB — BASIC METABOLIC PANEL
Anion gap: 10 (ref 5–15)
BUN: 11 mg/dL (ref 8–23)
CO2: 22 mmol/L (ref 22–32)
Calcium: 7 mg/dL — ABNORMAL LOW (ref 8.9–10.3)
Chloride: 96 mmol/L — ABNORMAL LOW (ref 98–111)
Creatinine, Ser: 0.88 mg/dL (ref 0.61–1.24)
GFR, Estimated: >60 mL/min (ref >60)
Glucose, Bld: 100 mg/dL — ABNORMAL HIGH (ref 70–99)
Potassium: 3.6 mmol/L (ref 3.5–5.1)
Sodium: 128 mmol/L — ABNORMAL LOW (ref 135–145)

## 2023-06-17 MED ORDER — LEVOTHYROXINE SODIUM 75 MCG PO TABS
75.0000 ug | ORAL_TABLET | Freq: Every day | ORAL | Status: DC
  Administered 2023-06-18 – 2023-06-20 (×3): 75 ug via ORAL
  Filled 2023-06-17 (×3): qty 1

## 2023-06-17 MED ORDER — POTASSIUM PHOSPHATES 15 MMOLE/5ML IV SOLN
30.0000 mmol | Freq: Once | INTRAVENOUS | Status: AC
  Administered 2023-06-17: 30 mmol via INTRAVENOUS
  Filled 2023-06-17: qty 10

## 2023-06-17 NOTE — Plan of Care (Signed)
  Problem: Clinical Measurements: Goal: Ability to maintain clinical measurements within normal limits will improve Outcome: Progressing Goal: Will remain free from infection Outcome: Progressing   

## 2023-06-17 NOTE — Progress Notes (Signed)
[facility] [address] [facility] Liaison Note  [facility] continues to follow for discharge disposition for hospice services at home versus LTC.  Noted MD note from today has patient at baseline and alert and oriented.  Wife requested discharge tomorrow per MD.  Liaison team will follow up tomorrow for additional discharge needs.  Doreatha Martin, RN, [facility] [phone]

## 2023-06-17 NOTE — Progress Notes (Signed)
BLE venous duplex has been completed.   Results can be found under chart review under CV PROC. 06/17/2023 12:53 PM Jecenia Leamer RVT, RDMS

## 2023-06-17 NOTE — Progress Notes (Signed)
PROGRESS NOTE Steven Cain  WUX:[id number] DOB: 1930/12/13 DOA: 06/10/2023 PCP: [name], MD  Brief Narrative/Hospital Course: 89 y.o.m w/ history significant of depression, htn, gi bleed, bph, COPD not on home oxygen, CVA, gerd, and metastatic prostate cancer presented to the hospital with his family for notably worsening confusion over the past 5 days.History of Pseudomonas with similar presentation who was recently treated with Cipro with no improvement. Patient on chronic prednisone. In the ED underwent CT head chest x-ray CT abdomen pelvis no acute finding, showed possible combination of atelectasis and fibrosis in the lung and diffusely sclerotic bony metastatic disease in the thoracolumbar spine thoracic and bony pelvis similar to PET scan of 01/12/23. Labs with hyponatremia 127, stable renal function LFTs, chronic anemia 12.5 g, UA with WBC 21-50 RBC more than 50 moderate LE negative nitrite, Urine culture obtained and admitted  for severe sepsis due to UTI and UTI, acute metabolic encephalopathy ON chronic baseline dementia.  Patient did not have improvement despite being on antibiotics, urine culture with pneumococcus faecalis-pansensitive  MRI brain with and without>>no acute pathology EEG>> slow generalized continuous wave, with moderate diffuse encephalopathy likely related to toxic metabolic causes like cefepime toxicity with no seizures. 11/1> mentation much better patient is able to converse oriented to self family-seen by neurology and LP deferred    Subjective: Patient seen and examined, wife at the bedside.  Patient has no complaints.  He is fully alert and orientated in my opinion, per wife, he is very close to baseline.  Wife says that she will feel better if he stays overnight and if he remains stable, she will be agreeable to discharge tomorrow.  Assessment and Plan: Principal Problem:   Acute encephalopathy Active Problems:   Prostate cancer metastatic to bone  (HCC)   Chronic bronchitis (HCC)   UTI (urinary tract infection)   Chronic kidney disease, stage 3b (HCC)   Anemia associated with acute blood loss   Hyponatremia   Essential hypertension   Seizure (HCC)   History of CVA (cerebrovascular accident)   Sepsis secondary to UTI (HCC)   Severe sepsis POA due to UTI from E faecalis: Pansensitive bacteria-on appropriate antibiotic with ampicillin.  He is on chronic prednisone and immunocompromised.  Has been started on ampicillin for UTI on 06/13/2023, will be fifth dose tomorrow. Recent Labs  Lab 06/10/23 1327 06/11/23 0043 06/12/23 0559 06/15/23 0959 06/17/23 0751  WBC 9.8 9.3 8.1 10.3 9.9    Acute toxic metabolic encephalopathy/acute hypothyroidism Chronic baseline dementia: Likely multifactorial in the setting of UTI/sepsis, hypothyroidism/?  Cefepime toxicity. Patient mentation started to improve from 11/1.  Workup so far-EEG with no seizure, question cefepime toxicity, MRI brain no acute finding. Patient being managed w/ high-dose thiamine, finding of hypothyroidism with TSH 27 and free T40.4 And placed on thyroid supplement. Seen by neuro and LP deferred since mentation was improving.  Patient is now fully alert and oriented, TSH 27 and free T4 level 0.41. 10/31 started Synthroid 75 mcg IV , in my opinion, this is more likely hypothyroidism, will transition him to oral Synthroid starting tomorrow.  Hypophosphatemia: Was low yesterday, will replenish today and recheck in the morning.  Mild transaminitis: Monitor  Hyponatremia, likely hypovolemic: Sodium has been fluctuating, but having frequent bowel movement laxative discontinued likely hypovolemic for which he received IV fluids yesterday.  Urine studies and osmolality argue against SIADH.  Sodium improved somewhat today.  Continue salt tablets. Recent Labs  Lab 06/14/23 0547 06/14/23 [phone] 06/15/23 0959 06/16/23  1610 06/17/23 0751  NA 127* 129* 129* 126* 128*    Metastatic  prostate cancer Low back pain Diffuse osseous metastasis on CT and PET scan: Holding Zytiga, continue prednisone, palliative care following continue pain control  Chronic bronchitis at baseline: Continue bronchodilators  CKD stage IIIb: Remains stable at baseline see below. Recent Labs    03/26/23 1043 06/10/23 1327 06/11/23 0043 06/12/23 0559 06/13/23 1322 06/14/23 0547 06/14/23 0907 06/15/23 0959 06/16/23 0458 06/17/23 0751  BUN 17 16 15 15 15 13 13 12 13 11   CREATININE 1.27* 1.24 1.05 0.98 1.03 0.94 0.88 1.04 1.08 0.88  CO2 30 24 26 24 26  20* 23 23 25 22   K 4.5 4.1 4.1 3.7 3.9 3.8 3.6 3.6 3.6 3.6    Hypertension: BP stable   Anemia of malignancy: Hemoglobin stable, monitor Recent Labs  Lab 06/10/23 1327 06/11/23 0043 06/12/23 0559 06/15/23 0959 06/17/23 0751  HGB 12.5* 11.2* 10.3* 10.9* 9.8*  HCT 37.5* 33.2* 30.8* 32.9* 29.9*   LE discoloration/Mottling: Will order duplex and ABI leg   CVA history: Continue ASA  Frequent bowel movement Constipation: Initially constipated given laxative now having frequent bowel movement but no diarrhea, discontinued MiraLAX and senna   Goc: Palliative care following closely, at risk of decompensation.  Continue DNR, will be seen by Ortho care at discharge.  DVT prophylaxis: enoxaparin (LOVENOX) injection 40 mg Start: 06/15/23 1500 SCDs Start: 06/10/23 2341> start chemical prophylaxis given his complex situation and decreased mobility Code Status:   Code Status: Limited: Do not attempt resuscitation (DNR) -DNR-LIMITED -Do Not Intubate/DNI  Family Communication: plan of care discussed with patient/wife  at bedside.    Patient status is: Inpatient because of acute metabolic encephalopathy Level of care: Med-Surg   Dispo: The patient is from: home            Anticipated disposition: TBD.  Mobilizing with PT OT no PT need  Objective: Vitals last 24 hrs: Vitals:   06/16/23 0524 06/16/23 1403 06/16/23 2154 06/17/23  0616  BP: (!) 164/78 (!) 158/69 (!) 183/82 (!) 169/75  Pulse: 83 87 80 83  Resp: 20 18 20 20   Temp: 97.7 F (36.5 C) (!) 97.4 F (36.3 C) 97.9 F (36.6 C) 98.3 F (36.8 C)  TempSrc: Oral Oral Oral Oral  SpO2: 97% 96% 95% 95%  Weight:      Height:       Weight change:   Physical Examination: General exam: Appears calm and comfortable  Respiratory system: Clear to auscultation. Respiratory effort normal. Cardiovascular system: S1 & S2 heard, RRR. No JVD, murmurs, rubs, gallops or clicks. No pedal edema. Gastrointestinal system: Abdomen is nondistended, soft and nontender. No organomegaly or masses felt. Normal bowel sounds heard. Central nervous system: Alert and oriented. No focal neurological deficits. Extremities: Symmetric 5 x 5 power. Skin: No rashes, lesions or ulcers.  Psychiatry: Judgement and insight appear normal. Mood & affect appropriate.     Medications reviewed:  Scheduled Meds:  acetaminophen  650 mg Oral TID   aspirin  81 mg Oral Daily   enoxaparin (LOVENOX) injection  40 mg Subcutaneous Q24H   feeding supplement  237 mL Oral BID BM   influenza vaccine adjuvanted  0.5 mL Intramuscular Tomorrow-1000   levothyroxine  75 mcg Intravenous Daily   predniSONE  5 mg Oral Daily   sodium chloride  1 g Oral TID WC  Continuous Infusions:  sodium chloride Stopped (06/17/23 0557)   ampicillin (OMNIPEN) IV 300 mL/hr at 06/17/23 [id number]  potassium PHOSPHATE IVPB (in mmol)      Diet Order             Diet Heart Room service appropriate? Yes; Fluid consistency: Thin  Diet effective now                 Nutrition Problem: Increased nutrient needs Etiology: cancer and cancer related treatments Signs/Symptoms: estimated needs Interventions: Ensure Enlive (each supplement provides 350kcal and 20 grams of protein)   Intake/Output Summary (Last 24 hours) at 06/17/2023 1004 Last data filed at 06/17/2023 1610 Gross per 24 hour  Intake 1189.45 ml  Output 1000 ml  Net  189.45 ml   Net IO Since Admission: -919.8 mL [06/17/23 1004]  Wt Readings from Last 3 Encounters:  06/10/23 70.7 kg  03/26/23 71.2 kg  01/26/23 71.8 kg     Unresulted Labs (From admission, onward)     Start     Ordered   06/22/23 0500  Creatinine, serum  (enoxaparin (LOVENOX)    CrCl >/= 30 ml/min)  Weekly,   R     Comments: while on enoxaparin therapy   Question:  Specimen collection method  Answer:  Lab=Lab collect   06/15/23 1403   06/17/23 0500  Basic metabolic panel  Daily,   R     Question:  Specimen collection method  Answer:  Lab=Lab collect   06/16/23 0857   06/17/23 0500  CBC  Daily,   R     Question:  Specimen collection method  Answer:  Lab=Lab collect   06/16/23 0906   06/14/23 1437  CSF culture w Gram Stain  Once,   R       Question:  Are there also cytology or pathology orders on this specimen?  Answer:  No   06/14/23 1437   06/14/23 1437  VZV PCR, CSF  Once,   R        06/14/23 1437          Data Reviewed: I have personally reviewed following labs and imaging studies CBC: Recent Labs  Lab 06/10/23 1327 06/11/23 0043 06/12/23 0559 06/15/23 0959 06/17/23 0751  WBC 9.8 9.3 8.1 10.3 9.9  HGB 12.5* 11.2* 10.3* 10.9* 9.8*  HCT 37.5* 33.2* 30.8* 32.9* 29.9*  MCV 89.3 91.2 91.7 91.9 92.6  PLT 278 247 225 293 287   Basic Metabolic Panel: Recent Labs  Lab 06/11/23 0043 06/12/23 0559 06/14/23 0547 06/14/23 0907 06/15/23 0959 06/16/23 0458 06/16/23 1931 06/17/23 0751  NA 124*   < > 127* 129* 129* 126*  --  128*  K 4.1   < > 3.8 3.6 3.6 3.6  --  3.6  CL 90*   < > 98 97* 95* 92*  --  96*  CO2 26   < > 20* 23 23 25   --  22  GLUCOSE 104*   < > 82 85 119* 94  --  100*  BUN 15   < > 13 13 12 13   --  11  CREATININE 1.05   < > 0.94 0.88 1.04 1.08  --  0.88  CALCIUM 8.2*   < > 7.7* 7.9* 7.5* 7.4*  --  7.0*  MG 2.2  --   --  2.3 2.2  --   --   --   PHOS 1.8*  --   --  1.4* 1.6* 1.3* 2.0*  --    < > = values in this interval not displayed.    GFR: Estimated Creatinine Clearance: 49.2  mL/min (by C-G formula based on SCr of 0.88 mg/dL). Liver Function Tests: Recent Labs  Lab 06/10/23 1327 06/11/23 0043 06/15/23 0959  AST 38 39 76*  ALT 11 11 18   ALKPHOS 238* 225* 231*  BILITOT 0.8 0.7 0.8  PROT 7.5 6.7 6.0*  ALBUMIN 3.3* 3.0* 2.6*   No results for input(s): "LIPASE", "AMYLASE" in the last 168 hours. Recent Labs  Lab 06/11/23 0043  AMMONIA 17  Coagulation Profile: Recent Labs  Lab 06/11/23 0043  INR 1.0  Thyroid Function Tests: No results for input(s): "TSH", "T4TOTAL", "FREET4", "T3FREE", "THYROIDAB" in the last 72 hours.  Sepsis Labs: No results for input(s): "PROCALCITON", "LATICACIDVEN" in the last 168 hours.  Recent Results (from the past 240 hour(s))  Urine Culture     Status: Abnormal   Collection Time: 06/10/23  4:53 PM   Specimen: Urine, Clean Catch  Result Value Ref Range Status   Specimen Description   Final    URINE, CLEAN CATCH Performed at [facility], [address] [place], [place] [address]    Special Requests   Final    NONE Performed at [facility], [address] [place], [place] [address]    Culture (A)  Final    50,000 COLONIES/mL ENTEROCOCCUS FAECALIS WITHIN MIXED CULTURE Performed at [facility], [address], [place], [place] [address]    Report Status 06/13/2023 FINAL  Final   Organism ID, Bacteria ENTEROCOCCUS FAECALIS (A)  Final      Susceptibility   Enterococcus faecalis - MIC*    AMPICILLIN <=2 SENSITIVE Sensitive     NITROFURANTOIN <=16 SENSITIVE Sensitive     VANCOMYCIN <=0.5 SENSITIVE Sensitive     * 50,000 COLONIES/mL ENTEROCOCCUS FAECALIS  Urine Culture (for pregnant, neutropenic or urologic patients or patients with an indwelling urinary catheter)     Status: Abnormal   Collection Time: 06/11/23  2:14 AM   Specimen: Urine, Clean Catch  Result Value Ref Range Status   Specimen Description   Final    URINE, CLEAN  CATCH Performed at [facility], [address], [place], [place] [address]    Special Requests   Final    NONE Performed at [facility], [address], [place], [place] [address]    Culture (A)  Final    <10,000 COLONIES/mL INSIGNIFICANT GROWTH Performed at [facility], [address] [place], [place] [address]    Report Status 06/12/2023 FINAL  Final  Culture, blood (Routine X 2) w Reflex to ID Panel     Status: None (Preliminary result)   Collection Time: 06/13/23  2:52 PM   Specimen: BLOOD  Result Value Ref Range Status   Specimen Description   Final    BLOOD BLOOD LEFT ARM Performed at [facility], [address] [place], [place] [address]    Special Requests   Final    BOTTLES DRAWN AEROBIC ONLY Blood Culture results may not be optimal due to an inadequate volume of blood received in culture bottles Performed at [facility], [address], [place], [place] [address]    Culture   Final    NO GROWTH 4 DAYS Performed at [facility], [address], [place], [place] [address]    Report Status PENDING  Incomplete  Culture, blood (Routine X 2) w Reflex to ID Panel     Status: None (Preliminary result)   Collection Time:  06/13/23  2:52 PM   Specimen: BLOOD  Result Value Ref Range Status   Specimen Description   Final    BLOOD BLOOD LEFT HAND Performed at [facility], [address] [place], [place] [address]    Special Requests   Final    BOTTLES DRAWN AEROBIC ONLY Blood Culture results may not be optimal due to an inadequate volume of blood received in culture bottles Performed at [facility], [address], [place], [place] [address]    Culture   Final    NO GROWTH 4 DAYS Performed at [facility], [address] [place], [place] [address]    Report Status PENDING  Incomplete    Antimicrobials: Anti-infectives (From  admission, onward)    Start     Dose/Rate Route Frequency Ordered Stop   06/13/23 1400  ampicillin (OMNIPEN) 1 g in sodium chloride 0.9 % 100 mL IVPB        1 g 300 mL/hr over 20 Minutes Intravenous Every 6 hours 06/13/23 1313     06/10/23 1845  ceFEPIme (MAXIPIME) 2 g in sodium chloride 0.9 % 100 mL IVPB        2 g 200 mL/hr over 30 Minutes Intravenous Every 12 hours 06/10/23 1833 06/13/23 0622      Culture/Microbiology    Component Value Date/Time   SDES  06/13/2023 1452    BLOOD BLOOD LEFT ARM Performed at [facility], [address] [place], [place] [address]    SDES  06/13/2023 1452    BLOOD BLOOD LEFT HAND Performed at [facility], [address], [place], [place] [address]    SPECREQUEST  06/13/2023 1452    BOTTLES DRAWN AEROBIC ONLY Blood Culture results may not be optimal due to an inadequate volume of blood received in culture bottles Performed at [facility], [address], [place], [place] [address]    SPECREQUEST  06/13/2023 1452    BOTTLES DRAWN AEROBIC ONLY Blood Culture results may not be optimal due to an inadequate volume of blood received in culture bottles Performed at [facility], [address] [place], [place] [address]    CULT  06/13/2023 1452    NO GROWTH 4 DAYS Performed at [facility], [address], [place], [place] [address]    CULT  06/13/2023 1452    NO GROWTH 4 DAYS Performed at [facility], [address], [place], [place] [address]    REPTSTATUS PENDING 06/13/2023 1452   REPTSTATUS PENDING 06/13/2023 1452   Radiology Studies: No results found.   LOS: 6 days   Hughie Closs, MD [facility]  06/17/2023, 10:04 AM

## 2023-06-18 DIAGNOSIS — G9340 Encephalopathy, unspecified: Secondary | ICD-10-CM | POA: Diagnosis not present

## 2023-06-18 LAB — BASIC METABOLIC PANEL
Anion gap: 11 (ref 5–15)
Anion gap: 9 (ref 5–15)
BUN: 9 mg/dL (ref 8–23)
BUN: 10 mg/dL (ref 8–23)
CO2: 22 mmol/L (ref 22–32)
CO2: 20 mmol/L — ABNORMAL LOW (ref 22–32)
Calcium: 6.6 mg/dL — ABNORMAL LOW (ref 8.9–10.3)
Calcium: 6.6 mg/dL — ABNORMAL LOW (ref 8.9–10.3)
Chloride: 91 mmol/L — ABNORMAL LOW (ref 98–111)
Chloride: 96 mmol/L — ABNORMAL LOW (ref 98–111)
Creatinine, Ser: 0.78 mg/dL (ref 0.61–1.24)
Creatinine, Ser: 0.88 mg/dL (ref 0.61–1.24)
GFR, Estimated: >60 mL/min (ref >60)
GFR, Estimated: >60 mL/min (ref >60)
Glucose, Bld: 89 mg/dL (ref 70–99)
Glucose, Bld: 106 mg/dL — ABNORMAL HIGH (ref 70–99)
Potassium: 4 mmol/L (ref 3.5–5.1)
Potassium: 4.5 mmol/L (ref 3.5–5.1)
Sodium: 124 mmol/L — ABNORMAL LOW (ref 135–145)
Sodium: 125 mmol/L — ABNORMAL LOW (ref 135–145)

## 2023-06-18 LAB — CBC
HCT: 29.9 % — ABNORMAL LOW (ref 39.0–52.0)
Hemoglobin: 9.8 g/dL — ABNORMAL LOW (ref 13.0–17.0)
MCH: 30.1 pg (ref 26.0–34.0)
MCHC: 32.8 g/dL (ref 30.0–36.0)
MCV: 91.7 fL (ref 80.0–100.0)
Platelets: 306 10*3/uL (ref 150–400)
RBC: 3.26 MIL/uL — ABNORMAL LOW (ref 4.22–5.81)
RDW: 15.7 % — ABNORMAL HIGH (ref 11.5–15.5)
WBC: 9.3 10*3/uL (ref 4.0–10.5)
nRBC: 0 % (ref 0.0–0.2)

## 2023-06-18 LAB — CULTURE, BLOOD (ROUTINE X 2)
Culture: NO GROWTH
Culture: NO GROWTH

## 2023-06-18 LAB — PHOSPHORUS: Phosphorus: 2 mg/dL — ABNORMAL LOW (ref 2.5–4.6)

## 2023-06-18 LAB — SODIUM: Sodium: 125 mmol/L — ABNORMAL LOW (ref 135–145)

## 2023-06-18 MED ORDER — SODIUM PHOSPHATES 45 MMOLE/15ML IV SOLN
30.0000 mmol | Freq: Once | INTRAVENOUS | Status: AC
  Administered 2023-06-18: 30 mmol via INTRAVENOUS
  Filled 2023-06-18 (×2): qty 10

## 2023-06-18 MED ORDER — MELATONIN 5 MG PO TABS
5.0000 mg | ORAL_TABLET | Freq: Every evening | ORAL | Status: AC | PRN
Start: 2023-06-18 — End: 2023-06-19
  Administered 2023-06-18 – 2023-06-19 (×2): 5 mg via ORAL
  Filled 2023-06-18 (×2): qty 1

## 2023-06-18 MED ORDER — SODIUM CHLORIDE 0.9 % IV SOLN
INTRAVENOUS | Status: AC
  Administered 2023-06-18: 1000 mL via INTRAVENOUS

## 2023-06-18 NOTE — Evaluation (Signed)
Physical Therapy Evaluation Patient Details Name: Steven Cain MRN: [id number] DOB: 1931/06/02 Today's Date: 06/18/2023  History of Present Illness  Patient is a 89 year old male presenting with increased confusion and back pain. CT abdomen pelvis as well and CT lumbar spine  does not show any acute findings.Patient was admitted with UTI, encephalopathy.  PMH: depression, htn, gi bleed, bph, CVA, gerd, and metastatic prostate cancer.  Clinical Impression  Pt admitted with above diagnosis.  Pt currently with functional limitations due to the deficits listed below (see PT Problem List). Pt will benefit from acute skilled PT to increase their independence and safety with mobility to allow discharge.     The patient's wife expressing concern for patient returning home  as he has not been OOB recently and reports recently has  had increased delirium and does not appear to be mobilizing as well..  Patient requires frequent verbal and tactile cue for moving to sitting and max support to stand and pivot to [facility] with wife assisting.  Patient repeated several times while on [facility] that he felt that he was going to pass out, also had  nausea with clear emesis.  + 2 arm max assist with arm hold to pivot  back to bed.  BP 165/73. Per wife, Palliative   to follow patient at DC. Wife reports having a hospital bed but not in use, can access if needed.       If plan is discharge home, recommend the following: Two people to help with walking and/or transfers;Assistance with cooking/housework;Assist for transportation;Help with stairs or ramp for entrance;Direct supervision/assist for financial management;A lot of help with bathing/dressing/bathroom   Can travel by private vehicle        Equipment Recommendations None recommended by PT  Recommendations for Other Services       Functional Status Assessment Patient has had a recent decline in their functional status and demonstrates the ability to make  significant improvements in function in a reasonable and predictable amount of time.     Precautions / Restrictions Precautions Precautions: Fall Restrictions Weight Bearing Restrictions: No      Mobility  Bed Mobility               General bed mobility comments: with increased time and cues provided to wife to give patient to reduce caregiver  burden.    Transfers Overall transfer level: Needs assistance   Transfers: Bed to chair/wheelchair/BSC   Stand pivot transfers: Max assist, +2 safety/equipment, +2 physical assistance         General transfer comment: wife assisted to stand from be with bear hug, patient needed tactile cues to step. +2 armhold to step  from [facility] back to bed.    Ambulation/Gait                  Stairs            Wheelchair Mobility     Tilt Bed    Modified Rankin (Stroke Patients Only)       Balance Overall balance assessment: Needs assistance Sitting-balance support: Feet supported, Bilateral upper extremity supported, Single extremity supported Sitting balance-Leahy Scale: Fair     Standing balance support: Bilateral upper extremity supported, During functional activity Standing balance-Leahy Scale: Poor Standing balance comment: needed BUE HHA to maintain balance increased fear of falling  Pertinent Vitals/Pain Pain Assessment Faces Pain Scale: No hurt    Home Living Family/patient expects to be discharged to:: Private residence Living Arrangements: Spouse/significant other Available Help at Discharge: Family Type of Home: House Home Access: Ramped entrance       Home Layout: One level Home Equipment: Agricultural consultant (2 wheels);Wheelchair - Therapist, nutritional bed;BSC/3in1 Additional Comments: some info above taken from previous admission    Prior Function Prior Level of Function : Needs assist  Cognitive Assist : Mobility (cognitive) Mobility  (Cognitive): Step by step cues   Physical Assist : Mobility (physical) Mobility (physical): Bed mobility   Mobility Comments: wife assists with bed mobility and amb short distances in the home/to bathroom; ADLs Comments: wife helps with dressing, toileting and bathing tasks. bedroom too small for walker.     Extremity/Trunk Assessment   Upper Extremity Assessment Upper Extremity Assessment: Generalized weakness    Lower Extremity Assessment Lower Extremity Assessment: Generalized weakness    Cervical / Trunk Assessment Cervical / Trunk Assessment: Kyphotic  Communication   Communication Communication: Hearing impairment Cueing Techniques: Verbal cues;Gestural cues;Tactile cues  Cognition Arousal: Alert Behavior During Therapy: Flat affect Overall Cognitive Status: Impaired/Different from baseline Area of Impairment: Orientation, Attention, Following commands, Safety/judgement, Awareness                 Orientation Level: Place, Time, Situation Current Attention Level: Sustained   Following Commands: Follows one step commands with increased time, Follows one step commands inconsistently   Awareness: Intellectual   General Comments: HOH no hearing aid present. patients wife reported patient has been delirous for the last 7 days. patient was able to follow commands noted to repeat self during session.        General Comments      Exercises     Assessment/Plan    PT Assessment Patient needs continued PT services  PT Problem List Decreased strength;Decreased activity tolerance;Decreased mobility;Decreased balance;Decreased safety awareness       PT Treatment Interventions Functional mobility training;Therapeutic activities;Therapeutic exercise;Patient/family education;Gait training    PT Goals (Current goals can be found in the Care Plan section)  Acute Rehab PT Goals Patient Stated Goal: go home PT Goal Formulation: With patient/family Time For Goal  Achievement: 07/02/23 Potential to Achieve Goals: Fair    Frequency Min 1X/week     Co-evaluation PT/OT/SLP Co-Evaluation/Treatment: Yes Reason for Co-Treatment: To address functional/ADL transfers PT goals addressed during session: Mobility/safety with mobility OT goals addressed during session: ADL's and self-care       AM-PAC PT "6 Clicks" Mobility  Outcome Measure Help needed turning from your back to your side while in a flat bed without using bedrails?: A Lot Help needed moving from lying on your back to sitting on the side of a flat bed without using bedrails?: A Lot Help needed moving to and from a bed to a chair (including a wheelchair)?: A Lot Help needed standing up from a chair using your arms (e.g., wheelchair or bedside chair)?: A Lot Help needed to walk in hospital room?: Total Help needed climbing 3-5 steps with a railing? : Total 6 Click Score: 10    End of Session Equipment Utilized During Treatment: Gait belt Activity Tolerance: Patient limited by fatigue Patient left: in bed;with call bell/phone within reach;with bed alarm set;with family/visitor present Nurse Communication: Mobility status PT Visit Diagnosis: Other abnormalities of gait and mobility (R26.89);Muscle weakness (generalized) (M62.81)    Time: 9528-4132 PT Time Calculation (min) (ACUTE ONLY): 29 min  Charges:   PT Evaluation $PT Re-evaluation: 1 Re-eval   PT General Charges $$ ACUTE PT VISIT: 1 Visit         Blanchard Kelch PT Acute Rehabilitation Services Office [phone] Weekend pager-[phone]   Rada Hay 06/18/2023, 12:23 PM

## 2023-06-18 NOTE — Progress Notes (Signed)
PROGRESS NOTE Steven Cain  EAV:[id number] DOB: Sep 13, 1930 DOA: 06/10/2023 PCP: [name], MD  Brief Narrative/Hospital Course: 89 y.o.m w/ history significant of depression, htn, gi bleed, bph, COPD not on home oxygen, CVA, gerd, and metastatic prostate cancer presented to the hospital with his family for notably worsening confusion over the past 5 days.History of Pseudomonas with similar presentation who was recently treated with Cipro with no improvement. Patient on chronic prednisone. In the ED underwent CT head chest x-ray CT abdomen pelvis no acute finding, showed possible combination of atelectasis and fibrosis in the lung and diffusely sclerotic bony metastatic disease in the thoracolumbar spine thoracic and bony pelvis similar to PET scan of 01/12/23. Labs with hyponatremia 127, stable renal function LFTs, chronic anemia 12.5 g, UA with WBC 21-50 RBC more than 50 moderate LE negative nitrite, Urine culture obtained and admitted  for severe sepsis due to UTI and UTI, acute metabolic encephalopathy ON chronic baseline dementia.  Patient did not have improvement despite being on antibiotics, urine culture with pneumococcus faecalis-pansensitive  MRI brain with and without>>no acute pathology EEG>> slow generalized continuous wave, with moderate diffuse encephalopathy likely related to toxic metabolic causes like cefepime toxicity with no seizures. 11/1> mentation much better patient is able to converse oriented to self family-seen by neurology and LP deferred    Subjective: Patient seen and went.  He had no complaints.  He was fully alert and oriented.  However wife was concerned that patient has not been up to the commode, like he usually does with her assist.  PT OT was consulted for imminent discharge.  When seen by PT, patient felt dizzy and vomited x 1 as well.  Wife does not think she can handle him at home in this position.  She does not feel comfortable taking him home.  Assessment  and Plan: Principal Problem:   Acute encephalopathy Active Problems:   Prostate cancer metastatic to bone (HCC)   Chronic bronchitis (HCC)   UTI (urinary tract infection)   Chronic kidney disease, stage 3b (HCC)   Anemia associated with acute blood loss   Hyponatremia   Essential hypertension   Seizure (HCC)   History of CVA (cerebrovascular accident)   Sepsis secondary to UTI (HCC)   Severe sepsis POA due to UTI from E faecalis: Pansensitive bacteria-on appropriate antibiotic with ampicillin.  He is on chronic prednisone and immunocompromised.  Has been started on ampicillin for UTI on 06/13/2023.  Recent Labs  Lab 06/12/23 0559 06/15/23 0959 06/17/23 0751 06/18/23 0524  WBC 8.1 10.3 9.9 9.3    Acute toxic metabolic encephalopathy/acute hypothyroidism Chronic baseline dementia: Likely multifactorial in the setting of UTI/sepsis, hypothyroidism/?  Cefepime toxicity. Patient mentation started to improve from 11/1.  Workup so far-EEG with no seizure, question cefepime toxicity, MRI brain no acute finding. Patient being managed w/ high-dose thiamine, finding of hypothyroidism with TSH 27 and free T40.4 And placed on thyroid supplement. Seen by neuro and LP deferred since mentation was improving.  Patient is now fully alert and oriented, TSH 27 and free T4 level 0.41. 10/31 started Synthroid 75 mcg IV , in my opinion, this is more likely hypothyroidism, continue oral Synthroid.  Hypophosphatemia: Low again, will replenish.  Mild transaminitis: Monitor  Hyponatremia, likely hypovolemic: Sodium has been fluctuating, but down to 125 today, urine studies and osmolality argue against SIADH.  Appears slightly dehydrated.  Could be hypovolemic.  Will give him 1.5 L of normal saline over 10 hours.  Continue salt tablets.  Recent Labs  Lab 06/15/23 0959 06/16/23 0458 06/17/23 0751 06/18/23 0524 06/18/23 1125  NA 129* 126* 128* 124* 125*    Metastatic prostate cancer Low back  pain Diffuse osseous metastasis on CT and PET scan: Holding Zytiga, continue prednisone, palliative care following continue pain control  Chronic bronchitis at baseline: Continue bronchodilators  CKD stage IIIb: Remains stable at baseline see below. Recent Labs    06/11/23 0043 06/12/23 0559 06/13/23 1322 06/14/23 0547 06/14/23 0907 06/15/23 0959 06/16/23 0458 06/17/23 0751 06/18/23 0524 06/18/23 1125  BUN 15 15 15 13 13 12 13 11 9 10   CREATININE 1.05 0.98 1.03 0.94 0.88 1.04 1.08 0.88 0.78 0.88  CO2 26 24 26  20* 23 23 25 22 22  20*  K 4.1 3.7 3.9 3.8 3.6 3.6 3.6 3.6 4.0 4.5    Hypertension: BP stable   Anemia of malignancy: Hemoglobin stable, monitor Recent Labs  Lab 06/12/23 0559 06/15/23 0959 06/17/23 0751 06/18/23 0524  HGB 10.3* 10.9* 9.8* 9.8*  HCT 30.8* 32.9* 29.9* 29.9*   LE discoloration/Mottling: Will order duplex and ABI leg   CVA history: Continue ASA  Frequent bowel movement Constipation: Initially constipated given laxative now having frequent bowel movement but no diarrhea, discontinued MiraLAX and senna   Goc: Palliative care following closely, at risk of decompensation.  Continue DNR, will be seen by Ortho care at discharge.  DVT prophylaxis: enoxaparin (LOVENOX) injection 40 mg Start: 06/15/23 1500 SCDs Start: 06/10/23 2341> start chemical prophylaxis given his complex situation and decreased mobility Code Status:   Code Status: Limited: Do not attempt resuscitation (DNR) -DNR-LIMITED -Do Not Intubate/DNI  Family Communication: plan of care discussed with patient/wife  at bedside.    Patient status is: Inpatient because of acute metabolic encephalopathy Level of care: Med-Surg   Dispo: The patient is from: home            Anticipated disposition: Potentially tomorrow.  Objective: Vitals last 24 hrs: Vitals:   06/17/23 1352 06/17/23 1814 06/17/23 2117 06/18/23 0644  BP: (!) 177/74 (!) 180/83 (!) 170/77 (!) 177/75  Pulse: 81 79 79 81   Resp: 16 18 20 20   Temp: 98.3 F (36.8 C) 98.1 F (36.7 C) (!) 97.5 F (36.4 C) 97.8 F (36.6 C)  TempSrc: Oral Oral Oral Oral  SpO2: 96% 96% 94% 94%  Weight:      Height:       Weight change:   Physical Examination: General exam: Appears calm and comfortable  Respiratory system: Clear to auscultation. Respiratory effort normal. Cardiovascular system: S1 & S2 heard, RRR. No JVD, murmurs, rubs, gallops or clicks. No pedal edema. Gastrointestinal system: Abdomen is nondistended, soft and nontender. No organomegaly or masses felt. Normal bowel sounds heard. Central nervous system: Alert and oriented. No focal neurological deficits. Extremities: Symmetric 5 x 5 power. Skin: No rashes, lesions or ulcers.  Psychiatry: Judgement and insight appear normal. Mood & affect appropriate.    Medications reviewed:  Scheduled Meds:  acetaminophen  650 mg Oral TID   aspirin  81 mg Oral Daily   enoxaparin (LOVENOX) injection  40 mg Subcutaneous Q24H   feeding supplement  237 mL Oral BID BM   influenza vaccine adjuvanted  0.5 mL Intramuscular Tomorrow-1000   levothyroxine  75 mcg Oral Q0600   predniSONE  5 mg Oral Daily   sodium chloride  1 g Oral TID WC  Continuous Infusions:  ampicillin (OMNIPEN) IV 1 g (06/18/23 0555)   sodium phosphate 30 mmol in dextrose 5 %  250 mL infusion      Diet Order             Diet Heart Room service appropriate? Yes; Fluid consistency: Thin  Diet effective now                 Nutrition Problem: Increased nutrient needs Etiology: cancer and cancer related treatments Signs/Symptoms: estimated needs Interventions: Ensure Enlive (each supplement provides 350kcal and 20 grams of protein)   Intake/Output Summary (Last 24 hours) at 06/18/2023 1251 Last data filed at 06/18/2023 0900 Gross per 24 hour  Intake 480 ml  Output 2000 ml  Net -1520 ml   Net IO Since Admission: -2,439.8 mL [06/18/23 1251]  Wt Readings from Last 3 Encounters:  06/10/23 70.7  kg  03/26/23 71.2 kg  01/26/23 71.8 kg     Unresulted Labs (From admission, onward)     Start     Ordered   06/22/23 0500  Creatinine, serum  (enoxaparin (LOVENOX)    CrCl >/= 30 ml/min)  Weekly,   R     Comments: while on enoxaparin therapy   Question:  Specimen collection method  Answer:  Lab=Lab collect   06/15/23 1403   06/17/23 0500  Basic metabolic panel  Daily,   R     Question:  Specimen collection method  Answer:  Lab=Lab collect   06/16/23 0857   06/17/23 0500  CBC  Daily,   R     Question:  Specimen collection method  Answer:  Lab=Lab collect   06/16/23 0906   06/14/23 1437  CSF culture w Gram Stain  Once,   R       Question:  Are there also cytology or pathology orders on this specimen?  Answer:  No   06/14/23 1437   06/14/23 1437  VZV PCR, CSF  Once,   R        06/14/23 1437          Data Reviewed: I have personally reviewed following labs and imaging studies CBC: Recent Labs  Lab 06/12/23 0559 06/15/23 0959 06/17/23 0751 06/18/23 0524  WBC 8.1 10.3 9.9 9.3  HGB 10.3* 10.9* 9.8* 9.8*  HCT 30.8* 32.9* 29.9* 29.9*  MCV 91.7 91.9 92.6 91.7  PLT 225 293 287 306   Basic Metabolic Panel: Recent Labs  Lab 06/14/23 0907 06/15/23 0959 06/16/23 0458 06/16/23 1931 06/17/23 0751 06/18/23 0524 06/18/23 1125  NA 129* 129* 126*  --  128* 124* 125*  K 3.6 3.6 3.6  --  3.6 4.0 4.5  CL 97* 95* 92*  --  96* 91* 96*  CO2 23 23 25   --  22 22 20*  GLUCOSE 85 119* 94  --  100* 89 106*  BUN 13 12 13   --  11 9 10   CREATININE 0.88 1.04 1.08  --  0.88 0.78 0.88  CALCIUM 7.9* 7.5* 7.4*  --  7.0* 6.6* 6.6*  MG 2.3 2.2  --   --   --   --   --   PHOS 1.4* 1.6* 1.3* 2.0*  --  2.0*  --    GFR: Estimated Creatinine Clearance: 49.2 mL/min (by C-G formula based on SCr of 0.88 mg/dL). Liver Function Tests: Recent Labs  Lab 06/15/23 0959  AST 76*  ALT 18  ALKPHOS 231*  BILITOT 0.8  PROT 6.0*  ALBUMIN 2.6*   No results for input(s): "LIPASE", "AMYLASE" in the last  168 hours. No results for input(s): "AMMONIA" in the last  168 hours. Coagulation Profile: No results for input(s): "INR", "PROTIME" in the last 168 hours. Thyroid Function Tests: No results for input(s): "TSH", "T4TOTAL", "FREET4", "T3FREE", "THYROIDAB" in the last 72 hours.  Sepsis Labs: No results for input(s): "PROCALCITON", "LATICACIDVEN" in the last 168 hours.  Recent Results (from the past 240 hour(s))  Urine Culture     Status: Abnormal   Collection Time: 06/10/23  4:53 PM   Specimen: Urine, Clean Catch  Result Value Ref Range Status   Specimen Description   Final    URINE, CLEAN CATCH Performed at [facility], [address] [place], [place] [address]    Special Requests   Final    NONE Performed at [facility], [address] [place], [place] [address]    Culture (A)  Final    50,000 COLONIES/mL ENTEROCOCCUS FAECALIS WITHIN MIXED CULTURE Performed at [facility], [address], [place], [place] [address]    Report Status 06/13/2023 FINAL  Final   Organism ID, Bacteria ENTEROCOCCUS FAECALIS (A)  Final      Susceptibility   Enterococcus faecalis - MIC*    AMPICILLIN <=2 SENSITIVE Sensitive     NITROFURANTOIN <=16 SENSITIVE Sensitive     VANCOMYCIN <=0.5 SENSITIVE Sensitive     * 50,000 COLONIES/mL ENTEROCOCCUS FAECALIS  Urine Culture (for pregnant, neutropenic or urologic patients or patients with an indwelling urinary catheter)     Status: Abnormal   Collection Time: 06/11/23  2:14 AM   Specimen: Urine, Clean Catch  Result Value Ref Range Status   Specimen Description   Final    URINE, CLEAN CATCH Performed at [facility], [address] [place], [place] [address]    Special Requests   Final    NONE Performed at [facility], [address], [place], [place] [address]    Culture (A)  Final    <10,000 COLONIES/mL INSIGNIFICANT GROWTH Performed at [facility], [address],  [place], [place] [address]    Report Status 06/12/2023 FINAL  Final  Culture, blood (Routine X 2) w Reflex to ID Panel     Status: None (Preliminary result)   Collection Time: 06/13/23  2:52 PM   Specimen: BLOOD  Result Value Ref Range Status   Specimen Description   Final    BLOOD BLOOD LEFT ARM Performed at [facility], [address] [place], [place] [address]    Special Requests   Final    BOTTLES DRAWN AEROBIC ONLY Blood Culture results may not be optimal due to an inadequate volume of blood received in culture bottles Performed at [facility], [address], [place], [place] [address]    Culture   Final    NO GROWTH 4 DAYS Performed at [facility], [address], [place], [place] [address]    Report Status PENDING  Incomplete  Culture, blood (Routine X 2) w Reflex to ID Panel     Status: None (Preliminary result)   Collection Time: 06/13/23  2:52 PM   Specimen: BLOOD  Result Value Ref Range Status   Specimen Description   Final    BLOOD BLOOD LEFT HAND Performed at [facility], [address], [place], [place] [address]    Special Requests   Final    BOTTLES DRAWN AEROBIC ONLY Blood Culture results may not be optimal due to an inadequate volume of blood received in culture  bottles Performed at [facility], [address], [place], [place] [address]    Culture   Final    NO GROWTH 4 DAYS Performed at [facility], [address] [place] [address], [place], [place] [address]    Report Status PENDING  Incomplete    Antimicrobials: Anti-infectives (From admission, onward)    Start     Dose/Rate Route Frequency Ordered Stop   06/13/23 1400  ampicillin (OMNIPEN) 1 g in sodium chloride 0.9 % 100 mL IVPB        1 g 300 mL/hr over 20 Minutes Intravenous Every 6 hours 06/13/23 1313     06/10/23 1845  ceFEPIme (MAXIPIME) 2 g in sodium chloride 0.9 % 100 mL IVPB        2 g 200 mL/hr over 30 Minutes  Intravenous Every 12 hours 06/10/23 1833 06/13/23 0622      Culture/Microbiology    Component Value Date/Time   SDES  06/13/2023 1452    BLOOD BLOOD LEFT ARM Performed at [facility], [address] [place], [place] [address]    SDES  06/13/2023 1452    BLOOD BLOOD LEFT HAND Performed at [facility], [address], [place], [place] [address]    SPECREQUEST  06/13/2023 1452    BOTTLES DRAWN AEROBIC ONLY Blood Culture results may not be optimal due to an inadequate volume of blood received in culture bottles Performed at [facility], [address] [place], [place] [address]    SPECREQUEST  06/13/2023 1452    BOTTLES DRAWN AEROBIC ONLY Blood Culture results may not be optimal due to an inadequate volume of blood received in culture bottles Performed at [facility], [address], [place], [place] [address]    CULT  06/13/2023 1452    NO GROWTH 4 DAYS Performed at [facility], [address], [place], [place] [address]    CULT  06/13/2023 1452    NO GROWTH 4 DAYS Performed at [facility], [address], [place], [place] [address]    REPTSTATUS PENDING 06/13/2023 1452   REPTSTATUS PENDING 06/13/2023 1452   Radiology Studies: VAS [place] LOWER EXTREMITY VENOUS (DVT)  Result Date: 06/17/2023  Lower Venous DVT Study Patient Name:  DR. Juluis Mire  Date of Exam:   06/17/2023 Medical Rec #: [id number]            Accession #:    [id number] Date of Birth: 1931/05/27             Patient Gender: M Patient Age:   82 years Exam Location:  [facility] Procedure:      VAS [place] LOWER EXTREMITY VENOUS (DVT) Referring Phys: RAMESH KC --------------------------------------------------------------------------------  Indications: Edema.  Limitations: Patient movement. Comparison Study: No previous exams Performing Technologist: Jody Hill RVT, RDMS  Examination Guidelines: A complete evaluation includes  B-mode imaging, spectral Doppler, color Doppler, and power Doppler as needed of all accessible portions of each vessel. Bilateral testing is considered an integral part of a complete examination. Limited examinations for reoccurring indications may be performed as noted. The reflux portion of the exam is performed with the patient in reverse Trendelenburg.  +---------+---------------+---------+-----------+----------+-------------------+ RIGHT    CompressibilityPhasicitySpontaneityPropertiesThrombus Aging      +---------+---------------+---------+-----------+----------+-------------------+ CFV      Full           Yes      Yes                                      +---------+---------------+---------+-----------+----------+-------------------+  SFJ      Full                                                             +---------+---------------+---------+-----------+----------+-------------------+ FV Prox  Full           Yes      Yes                                      +---------+---------------+---------+-----------+----------+-------------------+ FV Mid   Full           Yes      Yes                                      +---------+---------------+---------+-----------+----------+-------------------+ FV DistalFull           Yes      Yes                                      +---------+---------------+---------+-----------+----------+-------------------+ PFV      Full                                                             +---------+---------------+---------+-----------+----------+-------------------+ POP      Full           Yes      Yes                                      +---------+---------------+---------+-----------+----------+-------------------+ PTV      Full                                         Not well visualized +---------+---------------+---------+-----------+----------+-------------------+ PERO     Full                                          Not well visualized +---------+---------------+---------+-----------+----------+-------------------+   +---------+---------------+---------+-----------+----------+-------------------+ LEFT     CompressibilityPhasicitySpontaneityPropertiesThrombus Aging      +---------+---------------+---------+-----------+----------+-------------------+ CFV      Full           Yes      Yes                                      +---------+---------------+---------+-----------+----------+-------------------+ SFJ      Full                                                             +---------+---------------+---------+-----------+----------+-------------------+  FV Prox  Full           Yes      Yes                                      +---------+---------------+---------+-----------+----------+-------------------+ FV Mid   Full           Yes      Yes                                      +---------+---------------+---------+-----------+----------+-------------------+ FV DistalFull           Yes      Yes                                      +---------+---------------+---------+-----------+----------+-------------------+ PFV      Full                                                             +---------+---------------+---------+-----------+----------+-------------------+ POP      Full           Yes      Yes                                      +---------+---------------+---------+-----------+----------+-------------------+ PTV      Full                                         Not well visualized +---------+---------------+---------+-----------+----------+-------------------+ PERO     Full                                         Not well visualized +---------+---------------+---------+-----------+----------+-------------------+     Summary: BILATERAL: - No evidence of deep vein thrombosis seen in the lower extremities, bilaterally. -No evidence of popliteal  cyst, bilaterally.   *See table(s) above for measurements and observations. Electronically signed by Coral Else MD on 06/17/2023 at 4:08:26 PM.    Final      LOS: 7 days   Hughie Closs, MD [facility]  06/18/2023, 12:51 PM

## 2023-06-18 NOTE — Progress Notes (Signed)
Nutrition Follow-up  INTERVENTION:   -Ensure Plus High Protein po BID, each supplement provides 350 kcal and 20 grams of protein.   -Liberalize diet to regular, sodium restriction not warranted.  NUTRITION DIAGNOSIS:   Increased nutrient needs related to cancer and cancer related treatments as evidenced by estimated needs.  Ongoing.  GOAL:   Patient will meet greater than or equal to 90% of their needs  Progressing.  MONITOR:   PO intake, Supplement acceptance, Labs, Weight trends, I & O's   ASSESSMENT:   89 y.o. male  with past medical history of prostate cancer with mets to bone on [place] and [place] admitted on 06/10/2023 with UTI.  Patient currently consuming 50% of meals. Accepting Ensures once daily. Patient with episode of N/V today following working with therapy.  Will liberalize diet to regular given low sodium labs this admission.  Admission weight: 155 lbs No other weights this admission.  Medications: sodium phosphate  Labs reviewed: Low Na  Low Phos  Diet Order:   Diet Order             Diet Heart Room service appropriate? Yes; Fluid consistency: Thin  Diet effective now                   EDUCATION NEEDS:   No education needs have been identified at this time  Skin:  Skin Assessment: Reviewed RN Assessment  Last BM:  11/4 -type 6  Height:   Ht Readings from Last 1 Encounters:  06/10/23 5\' 3"  (1.6 m)    Weight:   Wt Readings from Last 1 Encounters:  06/10/23 70.7 kg    BMI:  Body mass index is 27.61 kg/m.  Estimated Nutritional Needs:   Kcal:  1800-2000  Protein:  80-95g  Fluid:  2L/day   Tilda Franco, MS, RD, LDN Inpatient Clinical Dietitian Contact information available via Amion

## 2023-06-18 NOTE — Evaluation (Signed)
Occupational Therapy Evaluation Patient Details Name: Steven Cain MRN: [id number] DOB: 1930-10-17 Today's Date: 06/18/2023   History of Present Illness Patient is a 89 year old male presenting with increased confusion and back pain. CT abdomen pelvis as well and CT lumbar spine  does not show any acute findings.Patient was admitted with UTI, encephalopathy.  PMH: depression, htn, gi bleed, bph, CVA, gerd, and metastatic prostate cancer.   Clinical Impression   Patient is a 89 year old male who was admitted for above. Patient was living at home with wife support for ADLs and transfers prior level. Currently, patient needs HHA +2 for transfer to [facility] and back with patient having onset of nausea and  attempts at producing emesis limiting further participation. Patient reported feeling like he was going to pass out and was returned to bed. Patient's BP was 165/73 mmhg and HR 91 bpm. Wife reported plan was to d/c home and she planned to speak with children on getting more support at home. OT to continue to follow during acute stay.       If plan is discharge home, recommend the following: A lot of help with bathing/dressing/bathroom;Assistance with cooking/housework;Direct supervision/assist for medications management;Assist for transportation;Help with stairs or ramp for entrance;Direct supervision/assist for financial management;Two people to help with walking and/or transfers    Functional Status Assessment  Patient has had a recent decline in their functional status and/or demonstrates limited ability to make significant improvements in function in a reasonable and predictable amount of time  Equipment Recommendations  Other (comment) (wife reported having all needed equipment)       Precautions / Restrictions Precautions Precautions: Fall Restrictions Weight Bearing Restrictions: No      Mobility Bed Mobility Overal bed mobility: Needs Assistance Bed Mobility: Supine to Sit      Supine to sit: Mod assist, HOB elevated, Used rails     General bed mobility comments: with increased time and cues provided to wife to give patient to reduce caregiver  burden.            Balance Overall balance assessment: Needs assistance Sitting-balance support: Feet supported, Bilateral upper extremity supported, Single extremity supported Sitting balance-Leahy Scale: Fair       Standing balance-Leahy Scale: Poor Standing balance comment: needed BUE HHA to maintain balance increased fear of falling         ADL either performed or assessed with clinical judgement   ADL Overall ADL's : Needs assistance/impaired         Upper Body Bathing: Sitting;Maximal assistance   Lower Body Bathing: Sitting/lateral leans;Maximal assistance   Upper Body Dressing : Sitting;Standing;Maximal assistance   Lower Body Dressing: Sit to/from stand;Sitting/lateral leans;Maximal assistance   Toilet Transfer: +2 for physical assistance;+2 for safety/equipment;Minimal assistance Toilet Transfer Details (indicate cue type and reason): needing HHA to transfer from E[facility] to [facility] in room. patients wife attempting to transfer him similarly to transfers at home with need for +2 for safety. patient reporting nausea sititng on commode and noted to spit up saliva. patient then reported feeling like he was going to pass out multiple times. patient returned to bed with +2 and 3rd person wife standing by to get back in bed. patients wife reporting having adapative equipment at home already. Toileting- Clothing Manipulation and Hygiene: Sit to/from stand;Maximal assistance                Pertinent Vitals/Pain Pain Assessment Pain Assessment: Faces Faces Pain Scale: No hurt  Extremity/Trunk Assessment Upper Extremity Assessment Upper Extremity Assessment: Generalized weakness;Difficult to assess due to impaired cognition   Lower Extremity Assessment Lower Extremity Assessment: Defer to PT  evaluation   Cervical / Trunk Assessment Cervical / Trunk Assessment: Kyphotic   Communication Communication Communication: Hearing impairment   Cognition Arousal: Alert Behavior During Therapy: Flat affect Overall Cognitive Status: Impaired/Different from baseline         General Comments: HOH no hearing aid present. patients wife reported patient has been delirous for the last 7 days. patient was able to follow commands noted to repeat self during session.                Home Living Family/patient expects to be discharged to:: Private residence Living Arrangements: Spouse/significant other Available Help at Discharge: Family Type of Home: House Home Access: Ramped entrance     Home Layout: One level         Bathroom Toilet: Handicapped height     Home Equipment: Agricultural consultant (2 wheels);Wheelchair - Therapist, nutritional bed;BSC/3in1          Prior Functioning/Environment Prior Level of Function : Needs assist       Physical Assist : Mobility (physical)     Mobility Comments: wife assists with bed mobility and amb short distances in the home/to bathroom; ADLs Comments: wife helps with dressing, toileting and bathing tasks. bedroom too small for walker.        OT Problem List: Decreased activity tolerance;Impaired balance (sitting and/or standing);Decreased safety awareness;Decreased cognition;Decreased knowledge of use of DME or AE;Decreased knowledge of precautions;Cardiopulmonary status limiting activity;Pain      OT Treatment/Interventions: Self-care/ADL training;Therapeutic exercise;Therapeutic activities;Patient/family education;Balance training    OT Goals(Current goals can be found in the care plan section) Acute Rehab OT Goals Patient Stated Goal: to go home OT Goal Formulation: With patient/family Time For Goal Achievement: 07/02/23 Potential to Achieve Goals: Fair  OT Frequency: Min 1X/week    Co-evaluation PT/OT/SLP  Co-Evaluation/Treatment: Yes Reason for Co-Treatment: To address functional/ADL transfers PT goals addressed during session: Mobility/safety with mobility OT goals addressed during session: ADL's and self-care      AM-PAC OT "6 Clicks" Daily Activity     Outcome Measure Help from another person eating meals?: A Little Help from another person taking care of personal grooming?: A Little Help from another person toileting, which includes using toliet, bedpan, or urinal?: A Lot Help from another person bathing (including washing, rinsing, drying)?: A Lot Help from another person to put on and taking off regular upper body clothing?: A Lot Help from another person to put on and taking off regular lower body clothing?: A Lot 6 Click Score: 14   End of Session Equipment Utilized During Treatment: Gait belt;Other (comment) (BSC) Nurse Communication: Other (comment) (IV burning per patient report.)  Activity Tolerance: Patient limited by fatigue;Other (comment) (feeling like he is going to "pass out") Patient left: in bed;with call bell/phone within reach;with bed alarm set;with family/visitor present  OT Visit Diagnosis: Unsteadiness on feet (R26.81);Muscle weakness (generalized) (M62.81);History of falling (Z91.81)                Time: 1610-9604 OT Time Calculation (min): 29 min Charges:  OT General Charges $OT Visit: 1 Visit OT Evaluation $OT Re-eval: 1 Re-eval  Rosalio Loud, MS Acute Rehabilitation Department Office# [phone]   Selinda Flavin 06/18/2023, 11:17 AM

## 2023-06-18 NOTE — Progress Notes (Signed)
[facility] Liaison Note   [facility] continues to follow for discharge disposition for hospice services at home.     Please call with any hospice related questions or concerns.  Glenna Fellows, BSN, RN, [facility] [phone]

## 2023-06-19 ENCOUNTER — Inpatient Hospital Stay: Payer: Medicare Other | Attending: Hematology

## 2023-06-19 DIAGNOSIS — G9340 Encephalopathy, unspecified: Secondary | ICD-10-CM | POA: Diagnosis not present

## 2023-06-19 LAB — CBC
HCT: 26.8 % — ABNORMAL LOW (ref 39.0–52.0)
Hemoglobin: 9.6 g/dL — ABNORMAL LOW (ref 13.0–17.0)
MCH: 30.9 pg (ref 26.0–34.0)
MCHC: 35.8 g/dL (ref 30.0–36.0)
MCV: 86.2 fL (ref 80.0–100.0)
Platelets: 323 10*3/uL (ref 150–400)
RBC: 3.11 MIL/uL — ABNORMAL LOW (ref 4.22–5.81)
RDW: 15.5 % (ref 11.5–15.5)
WBC: 10.1 10*3/uL (ref 4.0–10.5)
nRBC: 0.2 % (ref 0.0–0.2)

## 2023-06-19 LAB — BASIC METABOLIC PANEL
Anion gap: 10 (ref 5–15)
Anion gap: 10 (ref 5–15)
BUN: 8 mg/dL (ref 8–23)
BUN: 8 mg/dL (ref 8–23)
CO2: 21 mmol/L — ABNORMAL LOW (ref 22–32)
CO2: 22 mmol/L (ref 22–32)
Calcium: 6.4 mg/dL — CL (ref 8.9–10.3)
Calcium: 7 mg/dL — ABNORMAL LOW (ref 8.9–10.3)
Chloride: 95 mmol/L — ABNORMAL LOW (ref 98–111)
Chloride: 96 mmol/L — ABNORMAL LOW (ref 98–111)
Creatinine, Ser: 0.76 mg/dL (ref 0.61–1.24)
Creatinine, Ser: 0.69 mg/dL (ref 0.61–1.24)
GFR, Estimated: >60 mL/min (ref >60)
GFR, Estimated: >60 mL/min (ref >60)
Glucose, Bld: 84 mg/dL (ref 70–99)
Glucose, Bld: 122 mg/dL — ABNORMAL HIGH (ref 70–99)
Potassium: 3.4 mmol/L — ABNORMAL LOW (ref 3.5–5.1)
Potassium: 4.4 mmol/L (ref 3.5–5.1)
Sodium: 126 mmol/L — ABNORMAL LOW (ref 135–145)
Sodium: 128 mmol/L — ABNORMAL LOW (ref 135–145)

## 2023-06-19 LAB — PHOSPHORUS: Phosphorus: 1.9 mg/dL — ABNORMAL LOW (ref 2.5–4.6)

## 2023-06-19 MED ORDER — CALCIUM GLUCONATE-NACL 2-0.675 GM/100ML-% IV SOLN
2.0000 g | Freq: Once | INTRAVENOUS | Status: AC
  Administered 2023-06-19: 2000 mg via INTRAVENOUS
  Filled 2023-06-19: qty 100

## 2023-06-19 MED ORDER — POTASSIUM PHOSPHATES 15 MMOLE/5ML IV SOLN
30.0000 mmol | Freq: Once | INTRAVENOUS | Status: AC
  Administered 2023-06-19: 30 mmol via INTRAVENOUS
  Filled 2023-06-19: qty 10

## 2023-06-19 MED ORDER — POTASSIUM CHLORIDE CRYS ER 20 MEQ PO TBCR
40.0000 meq | EXTENDED_RELEASE_TABLET | Freq: Once | ORAL | Status: AC
  Administered 2023-06-19: 40 meq via ORAL
  Filled 2023-06-19: qty 2

## 2023-06-19 NOTE — Plan of Care (Signed)

## 2023-06-19 NOTE — Progress Notes (Signed)
CRITICAL VALUE STICKER  CRITICAL VALUE: calcium 6.4  RECEIVER (on-site recipient of call): Dorathy Daft, RN  DATE & TIME NOTIFIED: 06/19/23 0740  MD NOTIFIED: Hughie Closs, MD  TIME OF NOTIFICATION: 06/19/23 1610  RESPONSE:  See new orders

## 2023-06-19 NOTE — TOC Progression Note (Signed)
Transition of Care [facility]) - Progression Note    Patient Details  Name: Steven Cain MRN: [id number] Date of Birth: [birth year]  [facility] CM/SW Contact  Darleene Cleaver, [place] Phone Number: 06/19/2023, 6:33 PM  Clinical Narrative:    CSW attempted to contact patient's wife to discuss home hospice services.  CSW had to leave a message.  TOC to follow up tomorrow with patient home hospice.    Expected Discharge Plan and Services    Home with hospice.                                           Social Determinants of Health (SDOH) Interventions SDOH Screenings   Food Insecurity: No Food Insecurity (06/10/2023)  Housing: Low Risk  (06/10/2023)  Transportation Needs: No Transportation Needs (06/10/2023)  Utilities: Not At Risk (06/10/2023)  Tobacco Use: Medium Risk (06/10/2023)    Readmission Risk Interventions     No data to display

## 2023-06-19 NOTE — Plan of Care (Signed)
  Problem: Clinical Measurements: Goal: Ability to maintain clinical measurements within normal limits will improve Outcome: Progressing Goal: Will remain free from infection Outcome: Progressing Goal: Diagnostic test results will improve Outcome: Progressing Goal: Respiratory complications will improve Outcome: Progressing Goal: Cardiovascular complication will be avoided Outcome: Progressing   Problem: Activity: Goal: Risk for activity intolerance will decrease Outcome: Progressing   Problem: Nutrition: Goal: Adequate nutrition will be maintained Outcome: Progressing   Problem: Elimination: Goal: Will not experience complications related to bowel motility Outcome: Progressing Goal: Will not experience complications related to urinary retention Outcome: Progressing   Problem: Pain Management: Goal: General experience of comfort will improve Outcome: Progressing   Problem: Safety: Goal: Ability to remain free from injury will improve Outcome: Progressing   Problem: Skin Integrity: Goal: Risk for impaired skin integrity will decrease Outcome: Progressing

## 2023-06-19 NOTE — Progress Notes (Signed)
PROGRESS NOTE Steven Cain  UXL:[id number] DOB: October 26, 1930 DOA: 06/10/2023 PCP: [name], MD  Brief Narrative/Hospital Course: 89 y.o.m w/ history significant of depression, htn, gi bleed, bph, COPD not on home oxygen, CVA, gerd, and metastatic prostate cancer presented to the hospital with his family for notably worsening confusion over the past 5 days.History of Pseudomonas with similar presentation who was recently treated with Cipro with no improvement. Patient on chronic prednisone. In the ED underwent CT head chest x-ray CT abdomen pelvis no acute finding, showed possible combination of atelectasis and fibrosis in the lung and diffusely sclerotic bony metastatic disease in the thoracolumbar spine thoracic and bony pelvis similar to PET scan of 01/12/23. Labs with hyponatremia 127, stable renal function LFTs, chronic anemia 12.5 g, UA with WBC 21-50 RBC more than 50 moderate LE negative nitrite, Urine culture obtained and admitted  for severe sepsis due to UTI and UTI, acute metabolic encephalopathy ON chronic baseline dementia.  Patient did not have improvement despite being on antibiotics, urine culture with pneumococcus faecalis-pansensitive  MRI brain with and without>>no acute pathology EEG>> slow generalized continuous wave, with moderate diffuse encephalopathy likely related to toxic metabolic causes like cefepime toxicity with no seizures. 11/1> mentation much better patient is able to converse oriented to self family-seen by neurology and LP deferred    Subjective: Seen and examined.  He is fully alert and oriented and has no complaints.  Wife at the bedside.  Assessment and Plan: Principal Problem:   Acute encephalopathy Active Problems:   Prostate cancer metastatic to bone (HCC)   Chronic bronchitis (HCC)   UTI (urinary tract infection)   Chronic kidney disease, stage 3b (HCC)   Anemia associated with acute blood loss   Hyponatremia   Essential hypertension   Seizure  (HCC)   History of CVA (cerebrovascular accident)   Sepsis secondary to UTI (HCC)   Severe sepsis POA due to UTI from E faecalis: Pansensitive bacteria-on appropriate antibiotic with ampicillin.  He is on chronic prednisone and immunocompromised.  Has been started on ampicillin for UTI on 06/13/2023.  Recent Labs  Lab 06/15/23 0959 06/17/23 0751 06/18/23 0524 06/19/23 0548  WBC 10.3 9.9 9.3 10.1    Acute toxic metabolic encephalopathy/acute hypothyroidism Chronic baseline dementia: Likely multifactorial in the setting of UTI/sepsis, hypothyroidism/?  Cefepime toxicity. Patient mentation started to improve from 11/1.  Workup so far-EEG with no seizure, question cefepime toxicity, MRI brain no acute finding. Patient being managed w/ high-dose thiamine, finding of hypothyroidism with TSH 27 and free T40.4 And placed on thyroid supplement. Seen by neuro and LP deferred since mentation was improving.  Patient is now fully alert and oriented, TSH 27 and free T4 level 0.41. 10/31 started Synthroid 75 mcg IV , in my opinion, this is more likely hypothyroidism, continue oral Synthroid.  Hypophosphatemia: Low again, will replenish.  Hypocalcemia: Down to 6.4.  Will replenish.  Will check PTH.  Mild transaminitis: Monitor  Hyponatremia, likely hypovolemic: Sodium has been fluctuating, but down to 125 today, urine studies and osmolality argue against SIADH.  Appears well-hydrated.  Continue salt tablets. Recent Labs  Lab 06/17/23 0751 06/18/23 0524 06/18/23 1125 06/18/23 1714 06/19/23 0548  NA 128* 124* 125* 125* 126*    Metastatic prostate cancer Low back pain Diffuse osseous metastasis on CT and PET scan: Holding Zytiga, continue prednisone, palliative care following continue pain control  Chronic bronchitis at baseline: Continue bronchodilators  CKD stage IIIb: Remains stable at baseline see below. Recent Labs  06/12/23 0559 06/13/23 1322 06/14/23 0547 06/14/23 0907  06/15/23 0959 06/16/23 0458 06/17/23 0751 06/18/23 0524 06/18/23 1125 06/19/23 0548  BUN 15 15 13 13 12 13 11 9 10 8   CREATININE 0.98 1.03 0.94 0.88 1.04 1.08 0.88 0.78 0.88 0.76  CO2 24 26 20* 23 23 25 22 22  20* 21*  K 3.7 3.9 3.8 3.6 3.6 3.6 3.6 4.0 4.5 3.4*    Hypertension: BP stable   Anemia of malignancy: Hemoglobin stable, monitor Recent Labs  Lab 06/15/23 0959 06/17/23 0751 06/18/23 0524 06/19/23 0548  HGB 10.9* 9.8* 9.8* 9.6*  HCT 32.9* 29.9* 29.9* 26.8*   LE discoloration/Mottling: Will order duplex and ABI leg   CVA history: Continue ASA  Frequent bowel movement Constipation: Initially constipated given laxative now having frequent bowel movement but no diarrhea, discontinued MiraLAX and senna   Goc: Palliative care following closely, at risk of decompensation.  Continue DNR, will be seen by Ortho care at discharge.  DVT prophylaxis: enoxaparin (LOVENOX) injection 40 mg Start: 06/15/23 1500 SCDs Start: 06/10/23 2341> start chemical prophylaxis given his complex situation and decreased mobility Code Status:   Code Status: Limited: Do not attempt resuscitation (DNR) -DNR-LIMITED -Do Not Intubate/DNI  Family Communication: plan of care discussed with patient/wife  at bedside.    Patient status is: Inpatient because of acute metabolic encephalopathy Level of care: Med-Surg   Dispo: The patient is from: home            Anticipated disposition: Potentially tomorrow.  Objective: Vitals last 24 hrs: Vitals:   06/18/23 0644 06/18/23 1342 06/18/23 2145 06/19/23 0659  BP: (!) 177/75 (!) 173/78 (!) 177/80 (!) 158/66  Pulse: 81 82 80 80  Resp: 20 18 18 20   Temp: 97.8 F (36.6 C) 97.9 F (36.6 C) 98 F (36.7 C) 98.1 F (36.7 C)  TempSrc: Oral Oral Oral Oral  SpO2: 94% 95% 95% 95%  Weight:      Height:       Weight change:   Physical Examination:  General exam: Appears calm and comfortable  Respiratory system: Clear to auscultation. Respiratory  effort normal. Cardiovascular system: S1 & S2 heard, RRR. No JVD, murmurs, rubs, gallops or clicks. No pedal edema. Gastrointestinal system: Abdomen is nondistended, soft and nontender. No organomegaly or masses felt. Normal bowel sounds heard. Central nervous system: Alert and oriented. No focal neurological deficits. Extremities: Symmetric 5 x 5 power. Skin: No rashes, lesions or ulcers.  Psychiatry: Judgement and insight appear normal. Mood & affect appropriate.   Medications reviewed:  Scheduled Meds:  acetaminophen  650 mg Oral TID   aspirin  81 mg Oral Daily   enoxaparin (LOVENOX) injection  40 mg Subcutaneous Q24H   feeding supplement  237 mL Oral BID BM   influenza vaccine adjuvanted  0.5 mL Intramuscular Tomorrow-1000   levothyroxine  75 mcg Oral Q0600   predniSONE  5 mg Oral Daily   sodium chloride  1 g Oral TID WC  Continuous Infusions:  ampicillin (OMNIPEN) IV 1 g (06/19/23 0607)   potassium PHOSPHATE IVPB (in mmol)      Diet Order             Diet regular Room service appropriate? Yes with Assist; Fluid consistency: Thin  Diet effective now                 Nutrition Problem: Increased nutrient needs Etiology: cancer and cancer related treatments Signs/Symptoms: estimated needs Interventions: Ensure Enlive (each supplement provides 350kcal and 20  grams of protein)   Intake/Output Summary (Last 24 hours) at 06/19/2023 1101 Last data filed at 06/19/2023 0700 Gross per 24 hour  Intake 863.4 ml  Output 1375 ml  Net -511.6 ml   Net IO Since Admission: -2,951.4 mL [06/19/23 1101]  Wt Readings from Last 3 Encounters:  06/10/23 70.7 kg  03/26/23 71.2 kg  01/26/23 71.8 kg     Unresulted Labs (From admission, onward)     Start     Ordered   06/22/23 0500  Creatinine, serum  (enoxaparin (LOVENOX)    CrCl >/= 30 ml/min)  Weekly,   R     Comments: while on enoxaparin therapy   Question:  Specimen collection method  Answer:  Lab=Lab collect   06/15/23 1403    06/19/23 1700  Basic metabolic panel  5A & 5P,   R (with TIMED occurrences)     Question:  Specimen collection method  Answer:  Lab=Lab collect   06/19/23 0829   06/19/23 1052  Parathyroid hormone, intact (no Ca)  Add-on,   AD       Question:  Specimen collection method  Answer:  Lab=Lab collect   06/19/23 1051   06/19/23 1051  Calcium, ionized  Add-on,   AD       Question:  Specimen collection method  Answer:  Lab=Lab collect   06/19/23 1051   06/17/23 0500  CBC  Daily,   R     Question:  Specimen collection method  Answer:  Lab=Lab collect   06/16/23 0906   06/14/23 1437  CSF culture w Gram Stain  Once,   R       Question:  Are there also cytology or pathology orders on this specimen?  Answer:  No   06/14/23 1437   06/14/23 1437  VZV PCR, CSF  Once,   R        06/14/23 1437          Data Reviewed: I have personally reviewed following labs and imaging studies CBC: Recent Labs  Lab 06/15/23 0959 06/17/23 0751 06/18/23 0524 06/19/23 0548  WBC 10.3 9.9 9.3 10.1  HGB 10.9* 9.8* 9.8* 9.6*  HCT 32.9* 29.9* 29.9* 26.8*  MCV 91.9 92.6 91.7 86.2  PLT 293 287 306 323   Basic Metabolic Panel: Recent Labs  Lab 06/14/23 0907 06/15/23 0959 06/16/23 0458 06/16/23 1931 06/17/23 0751 06/18/23 0524 06/18/23 1125 06/18/23 1714 06/19/23 0548 06/19/23 0948  NA 129* 129* 126*  --  128* 124* 125* 125* 126*  --   K 3.6 3.6 3.6  --  3.6 4.0 4.5  --  3.4*  --   CL 97* 95* 92*  --  96* 91* 96*  --  95*  --   CO2 23 23 25   --  22 22 20*  --  21*  --   GLUCOSE 85 119* 94  --  100* 89 106*  --  84  --   BUN 13 12 13   --  11 9 10   --  8  --   CREATININE 0.88 1.04 1.08  --  0.88 0.78 0.88  --  0.76  --   CALCIUM 7.9* 7.5* 7.4*  --  7.0* 6.6* 6.6*  --  6.4*  --   MG 2.3 2.2  --   --   --   --   --   --   --   --   PHOS 1.4* 1.6* 1.3* 2.0*  --  2.0*  --   --   --  1.9*   GFR: Estimated Creatinine Clearance: 54.2 mL/min (by C-G formula based on SCr of 0.76 mg/dL). Liver Function  Tests: Recent Labs  Lab 06/15/23 0959  AST 76*  ALT 18  ALKPHOS 231*  BILITOT 0.8  PROT 6.0*  ALBUMIN 2.6*   No results for input(s): "LIPASE", "AMYLASE" in the last 168 hours. No results for input(s): "AMMONIA" in the last 168 hours. Coagulation Profile: No results for input(s): "INR", "PROTIME" in the last 168 hours. Thyroid Function Tests: No results for input(s): "TSH", "T4TOTAL", "FREET4", "T3FREE", "THYROIDAB" in the last 72 hours.  Sepsis Labs: No results for input(s): "PROCALCITON", "LATICACIDVEN" in the last 168 hours.  Recent Results (from the past 240 hour(s))  Urine Culture     Status: Abnormal   Collection Time: 06/10/23  4:53 PM   Specimen: Urine, Clean Catch  Result Value Ref Range Status   Specimen Description   Final    URINE, CLEAN CATCH Performed at [facility], [address] [place], [place] [address]    Special Requests   Final    NONE Performed at [facility], [address] [place], [place] [address]    Culture (A)  Final    50,000 COLONIES/mL ENTEROCOCCUS FAECALIS WITHIN MIXED CULTURE Performed at [facility], [address] [place], [place] [address]    Report Status 06/13/2023 FINAL  Final   Organism ID, Bacteria ENTEROCOCCUS FAECALIS (A)  Final      Susceptibility   Enterococcus faecalis - MIC*    AMPICILLIN <=2 SENSITIVE Sensitive     NITROFURANTOIN <=16 SENSITIVE Sensitive     VANCOMYCIN <=0.5 SENSITIVE Sensitive     * 50,000 COLONIES/mL ENTEROCOCCUS FAECALIS  Urine Culture (for pregnant, neutropenic or urologic patients or patients with an indwelling urinary catheter)     Status: Abnormal   Collection Time: 06/11/23  2:14 AM   Specimen: Urine, Clean Catch  Result Value Ref Range Status   Specimen Description   Final    URINE, CLEAN CATCH Performed at [facility], [address] [place], [place] [address]    Special Requests   Final    NONE Performed at [facility], [address] [place], [place] [address]    Culture (A)  Final    <10,000 COLONIES/mL INSIGNIFICANT GROWTH Performed at [facility], [address], [place], [place] [address]    Report Status 06/12/2023 FINAL  Final  Culture, blood (Routine X 2) w Reflex to ID Panel     Status: None   Collection Time: 06/13/23  2:52 PM   Specimen: BLOOD  Result Value Ref Range Status   Specimen Description   Final    BLOOD BLOOD LEFT ARM Performed at [facility], [address] [place], [place] [address]    Special Requests   Final    BOTTLES DRAWN AEROBIC ONLY Blood Culture results may not be optimal due to an inadequate volume of blood received in culture bottles Performed at [facility], [address], [place], [place] [address]    Culture   Final    NO GROWTH 5 DAYS Performed at [facility], [address] [place], [place] [address]    Report Status 06/18/2023 FINAL  Final  Culture, blood (Routine X 2) w Reflex to ID Panel     Status: None   Collection Time: 06/13/23  2:52 PM   Specimen: BLOOD  Result Value Ref Range Status   Specimen Description   Final    BLOOD BLOOD LEFT HAND Performed at [facility], [address], [place], [place] [address]    Special Requests   Final    BOTTLES DRAWN AEROBIC ONLY Blood Culture results may not be optimal due to an inadequate volume of blood received in culture bottles Performed at [facility], [address], [place], [place] [address]    Culture   Final    NO GROWTH 5 DAYS Performed at [facility], [address], [place], [place] [address]    Report Status 06/18/2023 FINAL  Final    Antimicrobials: Anti-infectives (From admission, onward)    Start     Dose/Rate Route Frequency Ordered Stop   06/13/23 1400  ampicillin (OMNIPEN) 1 g in sodium chloride 0.9 % 100 mL IVPB        1 g 300 mL/hr over 20 Minutes Intravenous Every 6  hours 06/13/23 1313     06/10/23 1845  ceFEPIme (MAXIPIME) 2 g in sodium chloride 0.9 % 100 mL IVPB        2 g 200 mL/hr over 30 Minutes Intravenous Every 12 hours 06/10/23 1833 06/13/23 0622      Culture/Microbiology    Component Value Date/Time   SDES  06/13/2023 1452    BLOOD BLOOD LEFT ARM Performed at [facility], [address], [place], [place] [address]    SDES  06/13/2023 1452    BLOOD BLOOD LEFT HAND Performed at [facility], [address], [place], [place] [address]    SPECREQUEST  06/13/2023 1452    BOTTLES DRAWN AEROBIC ONLY Blood Culture results may not be optimal due to an inadequate volume of blood received in culture bottles Performed at [facility], [address] [place], [place] [address]    SPECREQUEST  06/13/2023 1452    BOTTLES DRAWN AEROBIC ONLY Blood Culture results may not be optimal due to an inadequate volume of blood received in culture bottles Performed at [facility], [address] [place], [place] [address]    CULT  06/13/2023 1452    NO GROWTH 5 DAYS Performed at [facility], [address], [place], [place] [address]    CULT  06/13/2023 1452    NO GROWTH 5 DAYS Performed at [facility], [address], [place], [place] [address]    REPTSTATUS 06/18/2023 FINAL 06/13/2023 1452   REPTSTATUS 06/18/2023 FINAL 06/13/2023 1452   Radiology Studies: VAS [place] LOWER EXTREMITY VENOUS (DVT)  Result Date: 06/17/2023  Lower Venous DVT Study Patient Name:  DR. Juluis Mire  Date of Exam:   06/17/2023 Medical Rec #: [id number]            Accession #:    [id number] Date of Birth: 1931-02-14             Patient Gender: M Patient Age:   46 years Exam Location:  [facility] Procedure:      VAS [place] LOWER EXTREMITY VENOUS (DVT) Referring Phys: RAMESH KC --------------------------------------------------------------------------------  Indications: Edema.   Limitations: Patient movement. Comparison Study: No previous exams Performing Technologist: Jody Hill RVT, RDMS  Examination Guidelines: A complete evaluation includes B-mode imaging, spectral Doppler, color Doppler, and power Doppler as needed of all accessible portions of each vessel. Bilateral testing is considered an integral part of a complete examination. Limited examinations for reoccurring indications  may be performed as noted. The reflux portion of the exam is performed with the patient in reverse Trendelenburg.  +---------+---------------+---------+-----------+----------+-------------------+ RIGHT    CompressibilityPhasicitySpontaneityPropertiesThrombus Aging      +---------+---------------+---------+-----------+----------+-------------------+ CFV      Full           Yes      Yes                                      +---------+---------------+---------+-----------+----------+-------------------+ SFJ      Full                                                             +---------+---------------+---------+-----------+----------+-------------------+ FV Prox  Full           Yes      Yes                                      +---------+---------------+---------+-----------+----------+-------------------+ FV Mid   Full           Yes      Yes                                      +---------+---------------+---------+-----------+----------+-------------------+ FV DistalFull           Yes      Yes                                      +---------+---------------+---------+-----------+----------+-------------------+ PFV      Full                                                             +---------+---------------+---------+-----------+----------+-------------------+ POP      Full           Yes      Yes                                      +---------+---------------+---------+-----------+----------+-------------------+ PTV      Full                                          Not well visualized +---------+---------------+---------+-----------+----------+-------------------+ PERO     Full                                         Not well visualized +---------+---------------+---------+-----------+----------+-------------------+   +---------+---------------+---------+-----------+----------+-------------------+ LEFT     CompressibilityPhasicitySpontaneityPropertiesThrombus Aging      +---------+---------------+---------+-----------+----------+-------------------+ CFV      Full  Yes      Yes                                      +---------+---------------+---------+-----------+----------+-------------------+ SFJ      Full                                                             +---------+---------------+---------+-----------+----------+-------------------+ FV Prox  Full           Yes      Yes                                      +---------+---------------+---------+-----------+----------+-------------------+ FV Mid   Full           Yes      Yes                                      +---------+---------------+---------+-----------+----------+-------------------+ FV DistalFull           Yes      Yes                                      +---------+---------------+---------+-----------+----------+-------------------+ PFV      Full                                                             +---------+---------------+---------+-----------+----------+-------------------+ POP      Full           Yes      Yes                                      +---------+---------------+---------+-----------+----------+-------------------+ PTV      Full                                         Not well visualized +---------+---------------+---------+-----------+----------+-------------------+ PERO     Full                                         Not well visualized  +---------+---------------+---------+-----------+----------+-------------------+     Summary: BILATERAL: - No evidence of deep vein thrombosis seen in the lower extremities, bilaterally. -No evidence of popliteal cyst, bilaterally.   *See table(s) above for measurements and observations. Electronically signed by Coral Else MD on 06/17/2023 at 4:08:26 PM.    Final      LOS: 8 days   Hughie Closs, MD [facility]  06/19/2023, 11:01 AM

## 2023-06-20 ENCOUNTER — Other Ambulatory Visit (HOSPITAL_COMMUNITY): Payer: Self-pay

## 2023-06-20 DIAGNOSIS — G9340 Encephalopathy, unspecified: Secondary | ICD-10-CM | POA: Diagnosis not present

## 2023-06-20 LAB — CBC
HCT: 29.1 % — ABNORMAL LOW (ref 39.0–52.0)
Hemoglobin: 9.8 g/dL — ABNORMAL LOW (ref 13.0–17.0)
MCH: 30.4 pg (ref 26.0–34.0)
MCHC: 33.7 g/dL (ref 30.0–36.0)
MCV: 90.4 fL (ref 80.0–100.0)
Platelets: 307 10*3/uL (ref 150–400)
RBC: 3.22 MIL/uL — ABNORMAL LOW (ref 4.22–5.81)
RDW: 16 % — ABNORMAL HIGH (ref 11.5–15.5)
WBC: 9.9 10*3/uL (ref 4.0–10.5)
nRBC: 0.2 % (ref 0.0–0.2)

## 2023-06-20 LAB — BASIC METABOLIC PANEL
Anion gap: 8 (ref 5–15)
BUN: 9 mg/dL (ref 8–23)
CO2: 23 mmol/L (ref 22–32)
Calcium: 6.9 mg/dL — ABNORMAL LOW (ref 8.9–10.3)
Chloride: 96 mmol/L — ABNORMAL LOW (ref 98–111)
Creatinine, Ser: 0.8 mg/dL (ref 0.61–1.24)
GFR, Estimated: >60 mL/min (ref >60)
Glucose, Bld: 91 mg/dL (ref 70–99)
Potassium: 4 mmol/L (ref 3.5–5.1)
Sodium: 127 mmol/L — ABNORMAL LOW (ref 135–145)

## 2023-06-20 LAB — PHOSPHORUS: Phosphorus: 1.9 mg/dL — ABNORMAL LOW (ref 2.5–4.6)

## 2023-06-20 LAB — PARATHYROID HORMONE, INTACT (NO CA): PTH: 189 pg/mL — ABNORMAL HIGH (ref 15–65)

## 2023-06-20 LAB — CALCIUM, IONIZED: Calcium, Ionized, Serum: 3.7 mg/dL — ABNORMAL LOW (ref 4.5–5.6)

## 2023-06-20 MED ORDER — SODIUM CHLORIDE 0.9 % IV SOLN
4.0000 g | Freq: Once | INTRAVENOUS | Status: DC
  Filled 2023-06-20: qty 40

## 2023-06-20 MED ORDER — K PHOS MONO-SOD PHOS DI & MONO 155-852-130 MG PO TABS
500.0000 mg | ORAL_TABLET | Freq: Two times a day (BID) | ORAL | Status: DC
  Administered 2023-06-20: 500 mg via ORAL
  Filled 2023-06-20 (×2): qty 2

## 2023-06-20 MED ORDER — LEVOTHYROXINE SODIUM 75 MCG PO TABS
75.0000 ug | ORAL_TABLET | Freq: Every day | ORAL | 0 refills | Status: AC
  Filled 2023-06-20: qty 30, 30d supply, fill #0

## 2023-06-20 MED ORDER — K PHOS MONO-SOD PHOS DI & MONO 155-852-130 MG PO TABS
250.0000 mg | ORAL_TABLET | Freq: Two times a day (BID) | ORAL | 0 refills | Status: AC
  Filled 2023-06-20: qty 30, 15d supply, fill #0

## 2023-06-20 MED ORDER — SODIUM CHLORIDE 1 G PO TABS
1.0000 g | ORAL_TABLET | Freq: Three times a day (TID) | ORAL | 0 refills | Status: AC
Start: 2023-06-20 — End: 2023-07-05
  Filled 2023-06-20 (×2): qty 45, 15d supply, fill #0

## 2023-06-20 MED ORDER — CALCIUM CARBONATE ANTACID 500 MG PO CHEW
1.0000 | CHEWABLE_TABLET | Freq: Two times a day (BID) | ORAL | 0 refills | Status: AC
  Filled 2023-06-20: qty 30, 15d supply, fill #0

## 2023-06-20 NOTE — Progress Notes (Signed)
Patient left via [facility], escorted by [facility] staff on stretcher. Dayshift removed IV. AVS printed and given to [facility] staff.Patietn tolerated transfer well. VS taken.

## 2023-06-20 NOTE — Discharge Summary (Signed)
Physician Discharge Summary  Steven Cain ZOX:[id number] DOB: 1930-08-16 DOA: 06/10/2023  PCP: [name], MD  Admit date: 06/10/2023 Discharge date: 06/20/2023 30 Day Unplanned Readmission Risk Score    Flowsheet Row ED to Hosp-Admission (Current) from 06/10/2023 in [facility]  30 Day Unplanned Readmission Risk Score (%) 32.59 Filed at 06/20/2023 0801       This score is the patient's risk of an unplanned readmission within 30 days of being discharged (0 -100%). The score is based on dignosis, age, lab data, medications, orders, and past utilization.   Low:  0-14.9   Medium: 15-21.9   High: 22-29.9   Extreme: 30 and above          Admitted From: Home Disposition: Home  Recommendations for Outpatient Follow-up:  Follow up with PCP in 1-2 weeks Please obtain BMP/CBC in one week Please follow up with your PCP on the following pending results: Unresulted Labs (From admission, onward)     Start     Ordered   06/22/23 0500  Creatinine, serum  (enoxaparin (LOVENOX)    CrCl >/= 30 ml/min)  Weekly,   R     Comments: while on enoxaparin therapy   Question:  Specimen collection method  Answer:  Lab=Lab collect   06/15/23 1403   06/19/23 1052  Parathyroid hormone, intact (no Ca)  Add-on,   AD       Question:  Specimen collection method  Answer:  Lab=Lab collect   06/19/23 1051   06/17/23 0500  CBC  Daily,   R     Question:  Specimen collection method  Answer:  Lab=Lab collect   06/16/23 0906   06/14/23 1437  CSF culture w Gram Stain  Once,   R       Question:  Are there also cytology or pathology orders on this specimen?  Answer:  No   06/14/23 1437   06/14/23 1437  VZV PCR, CSF  Once,   R        06/14/23 1437              Home Health: Yes Equipment/Devices: Multiple DME's  Discharge Condition: Stable CODE STATUS: DNR Diet recommendation: Cardiac  Subjective: Seen and examined.  Wife at the bedside.  Patient fully alert and oriented and he desires  to go home.  He has no complaints.  Lengthy discussion with the wife.  Patient still has mild hypocalcemia and hypophosphatemia.  Patient has lost his IV.  Wife does not want him to have IV.  Since patient has mild electrolyte abnormalities without any symptoms, we agreed to discharge him on oral supplements.  Please note that I have prescribed him 15 days worth of supplements for all 3 electrolytes, will defer to PCP from there onwards.  Brief/Interim Summary: 89 y.o.m w/ history significant of depression, htn, gi bleed, bph, COPD not on home oxygen, CVA, gerd, and metastatic prostate cancer presented to the hospital with his family for notably worsening confusion over the past 5 days.History of Pseudomonas with similar presentation who was recently treated with Cipro with no improvement. Patient on chronic prednisone. In the ED underwent CT head chest x-ray CT abdomen pelvis no acute finding, showed possible combination of atelectasis and fibrosis in the lung and diffusely sclerotic bony metastatic disease in the thoracolumbar spine thoracic and bony pelvis similar to PET scan of 01/12/23. Labs with hyponatremia 127, stable renal function LFTs, chronic anemia 12.5 g, UA with WBC 21-50 RBC more than  50 moderate LE negative nitrite, Urine culture obtained and admitted  for severe sepsis due to UTI and UTI, acute metabolic encephalopathy ON chronic baseline dementia.  Patient did not have improvement despite being on antibiotics, urine culture with pneumococcus faecalis-pansensitive  MRI brain with and without>>no acute pathology EEG>> slow generalized continuous wave, with moderate diffuse encephalopathy likely related to toxic metabolic causes like cefepime toxicity with no seizures. 11/1> mentation started to improve and for past 3 to 4 days, patient has remained very alert and oriented, initially neurology was considering LP but since he continued to improve, LP decision was canceled.   Severe sepsis  POA due to UTI from E faecalis: Pansensitive bacteria-on appropriate antibiotic with ampicillin.  He is on chronic prednisone and immunocompromised.  Patient received ampicillin for 8 days.  Acute toxic metabolic encephalopathy/acute hypothyroidism Chronic baseline dementia: Likely multifactorial in the setting of UTI/sepsis, hypothyroidism/?  Cefepime toxicity. Patient mentation started to improve from 11/1.  Workup so far-EEG with no seizure, question cefepime toxicity, MRI brain no acute finding. Patient being managed w/ high-dose thiamine, finding of hypothyroidism with TSH 27 and free T40.4 And placed on thyroid supplement. Seen by neuro and LP deferred since mentation was improving.  Patient is now fully alert and oriented, TSH 27 and free T4 level 0.41. 10/31 started Synthroid 75 mcg IV , in my opinion, this is more likely hypothyroidism, he will be discharged on Synthroid.   Hypophosphatemia: Low again, he is being sent home on oral replenishment.  PTH drawn and results are still pending.   Hypocalcemia: 6.9.  Patient asymptomatic.  Will be sent home on oral replenishment.   Mild transaminitis: Monitor   Hyponatremia, likely hypovolemic: Sodium has been fluctuating, currently stable around 127.  Urine studies and osmolality argue against SIADH.  Appears well-hydrated.  Continue sodium supplement.  Metastatic prostate cancer Low back pain Diffuse osseous metastasis on CT and PET scan: Resume home medications and follow-ups.   Chronic bronchitis at baseline: Continue bronchodilators   CKD stage IIIb: Remains stable at baseline   Hypertension: BP stable    Anemia of malignancy: Hemoglobin stable, monitor  LE discoloration/Mottling: Doppler lower extremity negative for DVT.  CVA history: Continue ASA   Frequent bowel movement Constipation: Resolved.   Goc: Palliative care followed during this hospitalization.  Patient DNR.  Discharge plan was discussed with patient  and/or family member and they verbalized understanding and agreed with it.  Discharge Diagnoses:  Principal Problem:   Acute encephalopathy Active Problems:   Prostate cancer metastatic to bone (HCC)   Chronic bronchitis (HCC)   UTI (urinary tract infection)   Chronic kidney disease, stage 3b (HCC)   Anemia associated with acute blood loss   Hyponatremia   Essential hypertension   Seizure (HCC)   History of CVA (cerebrovascular accident)   Sepsis secondary to UTI [facility])    Discharge Instructions   Allergies as of 06/20/2023       Reactions   Bee Venom Swelling        Medication List     STOP taking these medications    oxyCODONE 5 MG immediate release tablet Commonly known as: Roxicodone       TAKE these medications    abiraterone acetate 250 MG tablet Commonly known as: ZYTIGA Take 1,000 mg by mouth daily.   acetaminophen 500 MG tablet Commonly known as: TYLENOL Take 1 tablet (500 mg total) by mouth every 6 (six) hours as needed for mild pain. What changed: how much  to take   albuterol 108 (90 Base) MCG/ACT inhaler Commonly known as: VENTOLIN HFA Inhale 2 puffs into the lungs every 6 (six) hours as needed for wheezing or shortness of breath.   aspirin EC 81 MG tablet Take 81 mg by mouth daily.   calcium carbonate 500 MG chewable tablet Commonly known as: Tums Chew 1 tablet (200 mg of elemental calcium total) by mouth 2 (two) times daily for 15 days.   cyanocobalamin 1000 MCG/ML injection Commonly known as: VITAMIN B12 Inject 1,000 mcg into the muscle every 30 (thirty) days.   Fish Oil 1200 MG Caps Take 1,200 mg by mouth daily.   hydroxypropyl methylcellulose / hypromellose 2.5 % ophthalmic solution Commonly known as: ISOPTO TEARS / GONIOVISC Place 1 drop into both eyes daily as needed for dry eyes.   leuprolide (6 Month) 45 MG injection Commonly known as: ELIGARD Inject 45 mg into the skin every 6 (six) months.   levothyroxine 75 MCG  tablet Commonly known as: SYNTHROID Take 1 tablet (75 mcg total) by mouth daily at 6 (six) AM. Start taking on: June 21, 2023   Magnesium 250 MG Tabs Take 250 mg by mouth once a week.   meclizine 25 MG tablet Commonly known as: ANTIVERT Take 25 mg by mouth 2 (two) times daily as needed for dizziness.   melatonin 3 MG Tabs tablet Take 3 mg by mouth at bedtime as needed.   multivitamin with minerals Tabs tablet Take 1 tablet by mouth 2 (two) times a week.   NEURIVA PO Take 1 capsule by mouth daily.   oxybutynin 5 MG tablet Commonly known as: DITROPAN Take 5 mg by mouth 2 (two) times daily as needed for bladder spasms.   PARoxetine 40 MG tablet Commonly known as: PAXIL Take 1 tablet (40 mg total) by mouth daily. What changed: when to take this   phosphorus 155-852-130 MG tablet Commonly known as: K PHOS NEUTRAL Take 1 tablet (250 mg total) by mouth 2 (two) times daily for 15 days.   predniSONE 5 MG tablet Commonly known as: DELTASONE Take 5 mg by mouth daily.   senna-docusate 8.6-50 MG tablet Commonly known as: Senokot-S Take 1 tablet by mouth at bedtime. What changed:  how much to take when to take this   sodium chloride 1 g tablet Take 1 tablet (1 g total) by mouth 3 (three) times daily for 15 days.   traMADol 50 MG tablet Commonly known as: ULTRAM Take 50 mg by mouth 2 (two) times daily as needed.   traZODone 50 MG tablet Commonly known as: DESYREL Take 50 mg by mouth at bedtime.   VICKS VAPORUB EX Apply 1 Application topically daily as needed (congestion).   vitamin C 1000 MG tablet Take 1,000 mg by mouth once a week.   vitamin E 180 MG (400 UNITS) capsule Take 400 Units by mouth every Wednesday.   Voltaren 1 % Gel Generic drug: diclofenac Sodium Apply 1 Application topically daily as needed (pain).   XGEVA [place] Inject 1 Dose into the skin every 28 (twenty-eight) days.   Zinc 50 MG Tabs Take 50 mg by mouth daily.        Follow-up  Information     [name], MD. Schedule an appointment as soon as possible for a visit .   Specialty: Internal Medicine Why: As needed Contact information: [address] [place] [address] [phone]         [name], MD Follow up in 1 week(s).  Specialty: Internal Medicine Contact information: [address] [place] [address] [phone]                Allergies  Allergen Reactions   Bee Venom Swelling    Consultations: Neurology and palliative care   Procedures/Studies: VAS [place] LOWER EXTREMITY VENOUS (DVT)  Result Date: 06/17/2023  Lower Venous DVT Study Patient Name:  DR. Juluis Mire  Date of Exam:   06/17/2023 Medical Rec #: [id number]            Accession #:    [id number] Date of Birth: Feb 02, 1931             Patient Gender: M Patient Age:   40 years Exam Location:  [facility] Procedure:      VAS [place] LOWER EXTREMITY VENOUS (DVT) Referring Phys: RAMESH KC --------------------------------------------------------------------------------  Indications: Edema.  Limitations: Patient movement. Comparison Study: No previous exams Performing Technologist: Jody Hill RVT, RDMS  Examination Guidelines: A complete evaluation includes B-mode imaging, spectral Doppler, color Doppler, and power Doppler as needed of all accessible portions of each vessel. Bilateral testing is considered an integral part of a complete examination. Limited examinations for reoccurring indications may be performed as noted. The reflux portion of the exam is performed with the patient in reverse Trendelenburg.  +---------+---------------+---------+-----------+----------+-------------------+ RIGHT    CompressibilityPhasicitySpontaneityPropertiesThrombus Aging      +---------+---------------+---------+-----------+----------+-------------------+ CFV      Full           Yes      Yes                                       +---------+---------------+---------+-----------+----------+-------------------+ SFJ      Full                                                             +---------+---------------+---------+-----------+----------+-------------------+ FV Prox  Full           Yes      Yes                                      +---------+---------------+---------+-----------+----------+-------------------+ FV Mid   Full           Yes      Yes                                      +---------+---------------+---------+-----------+----------+-------------------+ FV DistalFull           Yes      Yes                                      +---------+---------------+---------+-----------+----------+-------------------+ PFV      Full                                                             +---------+---------------+---------+-----------+----------+-------------------+  POP      Full           Yes      Yes                                      +---------+---------------+---------+-----------+----------+-------------------+ PTV      Full                                         Not well visualized +---------+---------------+---------+-----------+----------+-------------------+ PERO     Full                                         Not well visualized +---------+---------------+---------+-----------+----------+-------------------+   +---------+---------------+---------+-----------+----------+-------------------+ LEFT     CompressibilityPhasicitySpontaneityPropertiesThrombus Aging      +---------+---------------+---------+-----------+----------+-------------------+ CFV      Full           Yes      Yes                                      +---------+---------------+---------+-----------+----------+-------------------+ SFJ      Full                                                             +---------+---------------+---------+-----------+----------+-------------------+ FV  Prox  Full           Yes      Yes                                      +---------+---------------+---------+-----------+----------+-------------------+ FV Mid   Full           Yes      Yes                                      +---------+---------------+---------+-----------+----------+-------------------+ FV DistalFull           Yes      Yes                                      +---------+---------------+---------+-----------+----------+-------------------+ PFV      Full                                                             +---------+---------------+---------+-----------+----------+-------------------+ POP      Full           Yes      Yes                                      +---------+---------------+---------+-----------+----------+-------------------+  PTV      Full                                         Not well visualized +---------+---------------+---------+-----------+----------+-------------------+ PERO     Full                                         Not well visualized +---------+---------------+---------+-----------+----------+-------------------+     Summary: BILATERAL: - No evidence of deep vein thrombosis seen in the lower extremities, bilaterally. -No evidence of popliteal cyst, bilaterally.   *See table(s) above for measurements and observations. Electronically signed by Coral Else MD on 06/17/2023 at 4:08:26 PM.    Final    DG Abd 1 View  Result Date: 06/14/2023 CLINICAL DATA:  [id number] Abdominal pain [id number] EXAM: ABDOMEN - 1 VIEW COMPARISON:  CT AP 06/10/23 FINDINGS: Left-sided double-J ureteral stent in place. Focally dilated loop of small bowel in the right lower quadrant is nonspecific. No pneumatosis. No supine evidence of pneumoperitoneum. Likely bibasilar atelectasis severe degenerative changes of the right hip joint. Left hip arthroplasty. Severe degenerative changes in the lumbar spine. IMPRESSION: Focally dilated loop of small  bowel in the right lower quadrant is nonspecific. If there is high clinical concern for an acute abdominal process, further evaluation with a CT of the abdomen and pelvis could be considered. Electronically Signed   By: Lorenza Cambridge M.D.   On: 06/14/2023 14:58   MR BRAIN W WO CONTRAST  Result Date: 06/14/2023 CLINICAL DATA:  Altered mental status EXAM: MRI HEAD WITHOUT AND WITH CONTRAST TECHNIQUE: Multiplanar, multiecho pulse sequences of the brain and surrounding structures were obtained without and with intravenous contrast. CONTRAST:  7mL GADAVIST GADOBUTROL 1 MMOL/ML IV SOLN COMPARISON:  Head CT 06/10/2023 FINDINGS: Brain: There is no acute intracranial hemorrhage, extra-axial fluid collection, or acute infarct. Parenchymal volume is within expected limits for age. The ventricles are normal in size. There is mild background chronic small-vessel ischemic change in the supratentorial white matter. There are small remote infarcts in the right thalamus and right cerebellar hemisphere. The pituitary and suprasellar region are normal. There is no mass lesion or abnormal enhancement. There is no mass effect or midline shift. Vascular: Normal flow voids. Skull and upper cervical spine: Normal marrow signal. Sinuses/Orbits: The paranasal sinuses are essentially clear. The globes and orbits are unremarkable. Other: The mastoid air cells and middle ear cavities are clear. IMPRESSION: No acute intracranial pathology. Electronically Signed   By: Lesia Hausen M.D.   On: 06/14/2023 13:11   EEG adult  Result Date: 06/13/2023 Charlsie Quest, MD     06/13/2023  4:38 PM Patient Name: STALIN GRUENBERG MRN: [id number] Epilepsy Attending: Charlsie Quest Referring Physician/Provider: Kathrynn Running, MD Date: 06/13/2023 Duration: 25.35 mins Patient history: 89yo m with ams getting eeg to evaluate for seizure Level of alertness: Awake AEDs during EEG study: None Technical aspects: This EEG study was done with scalp  electrodes positioned according to the 10-20 International system of electrode placement. Electrical activity was reviewed with band pass filter of 1-70Hz , sensitivity of 7 uV/mm, display speed of 44mm/sec with a 60Hz  notched filter applied as appropriate. EEG data were recorded continuously and digitally stored.  Video monitoring was available and reviewed as appropriate. Description: EEG showed continuous generalized  3 to 5  Hz theta-delta slowing. Generalized periodic discharges with triphasic morphology at 1- 1.5 Hz were also noted. Hyperventilation and photic stimulation were not performed.   Of note, study was technically difficult due to significant movement and electrode artifact. ABNORMALITY - Periodic discharges with triphasic morphology, generalized ( GPDs) - Continuous slow, generalized IMPRESSION: This technically difficult study is suggestive of moderate diffuse encephalopathy but likely related to toxic-metabolic causes like cefepime toxicity. No seizures or definite epileptiform discharges were seen throughout the recording. Priyanka O Yadav   CT L-SPINE NO CHARGE  Result Date: 06/10/2023 CLINICAL DATA:  Metastatic prostate cancer, lower back pain, confusion EXAM: CT LUMBAR SPINE WITHOUT CONTRAST TECHNIQUE: Multidetector CT imaging of the lumbar spine was performed without intravenous contrast administration. Multiplanar CT image reconstructions were also generated. RADIATION DOSE REDUCTION: This exam was performed according to the departmental dose-optimization program which includes automated exposure control, adjustment of the mA and/or kV according to patient size and/or use of iterative reconstruction technique. COMPARISON:  01/12/2023 FINDINGS: Segmentation: 5 lumbar type vertebrae. Alignment: Mild degenerative anterolisthesis of L3 on L4. Otherwise alignment is anatomic. Vertebrae: Diffuse sclerotic bony metastases are again identified, similar in appearance to recent PET scan. Sclerotic  metastatic disease is seen throughout the visualized thoracolumbar spine, thoracic cage, and bony pelvis. No evidence of pathologic fracture. Paraspinal and other soft tissues: Paraspinal soft tissues are grossly unremarkable. Please see concurrent CT abdomen and pelvis exam for intraperitoneal and retroperitoneal findings. Disc levels: There is extensive multilevel spondylosis and facet hypertrophy throughout the lumbar spine. Changes are most pronounced from L3-4 through L5-S1, with mild central canal stenosis and bilateral neural foraminal encroachment. Reconstructed images demonstrate no additional findings. IMPRESSION: 1. Diffuse sclerotic bony metastatic disease within the visualized portions of the thoracolumbar spine, thoracic cage, and bony pelvis. Similar appearance and distribution to recent PET scan 01/12/2023. 2. No acute or pathologic fractures. 3. Extensive multilevel lumbar spondylosis and facet hypertrophy, unchanged since prior exam. Electronically Signed   By: Sharlet Salina M.D.   On: 06/10/2023 21:00   CT ABDOMEN PELVIS W CONTRAST  Result Date: 06/10/2023 CLINICAL DATA:  Abdominal pain, generalized weakness, lower back pain, known history of metastatic prostate cancer EXAM: CT ABDOMEN AND PELVIS WITH CONTRAST TECHNIQUE: Multidetector CT imaging of the abdomen and pelvis was performed using the standard protocol following bolus administration of intravenous contrast. RADIATION DOSE REDUCTION: This exam was performed according to the departmental dose-optimization program which includes automated exposure control, adjustment of the mA and/or kV according to patient size and/or use of iterative reconstruction technique. CONTRAST:  75mL OMNIPAQUE IOHEXOL 300 MG/ML  SOLN COMPARISON:  02/03/2022, 01/12/2023 FINDINGS: Lower chest: Trace bilateral pleural effusions, right greater than left. Bibasilar subpleural scarring and fibrosis again noted. No acute airspace disease. Hepatobiliary: Innumerable  hypodense liver masses are identified, consistent with metastatic disease. Largest index lesion within the left lobe liver image 22/301 measures 3.4 cm. Gallbladder is unremarkable. No intrahepatic biliary duct dilation. Pancreas: Diffuse pancreatic atrophy. No focal abnormality or acute inflammatory change. Spleen: Normal in size without focal abnormality. Adrenals/Urinary Tract: Chronic left renal atrophy, with indwelling left ureteral stent extending from the left renal pelvis into the bladder lumen. Mild right renal atrophy without focal abnormality. The adrenals are stable. Evaluation of the bladder is limited by streak artifact from left hip arthroplasty. No evidence of filling defect. Stomach/Bowel: No bowel obstruction or ileus. 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 No bowel wall thickening or acute inflammatory change. Vascular/Lymphatic:  Aortic atherosclerosis again identified. There is high-grade stenosis within the right common iliac artery, estimated greater than 90%. 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 High-grade stenosis is also seen within the left common femoral artery just proximal to the bifurcation, also estimated at greater than 90%. No pathologic adenopathy within the abdomen or pelvis. Reproductive: Stable appearance of the prostate. Other: No free fluid or free intraperitoneal gas. No abdominal wall hernia. Musculoskeletal: Diffuse sclerotic metastatic disease is identified throughout the visualized spine, thoracic cage, and bony pelvis. Distribution is similar to recent PET scan, with significant progression since the 2023 CT study. There are no acute displaced fractures. Unremarkable left hip arthroplasty. Reconstructed images demonstrate no additional findings. IMPRESSION: 1. Interval development of innumerable hypodense masses throughout the liver, consistent  with hepatic metastases in this patient with a known history of metastatic prostate cancer. 2. Diffuse sclerotic bony metastases, with a similar appearance to recent PET scan. No acute fractures. 3. Trace bilateral pleural effusions, right greater than left. 4. Stable bibasilar subpleural scarring and fibrosis. No acute airspace disease. 5.  Aortic Atherosclerosis (ICD10-I70.0). 6. Chronic left renal atrophy, with stable appearance of the indwelling left ureteral stent. Electronically Signed   By: Sharlet Salina M.D.   On: 06/10/2023 20:57   CT Head Wo Contrast  Result Date: 06/10/2023 CLINICAL DATA:  Mental status change, unknown cause EXAM: CT HEAD WITHOUT CONTRAST TECHNIQUE: Contiguous axial images were obtained from the base of the skull through the vertex without intravenous contrast. RADIATION DOSE REDUCTION: This exam was performed according to the departmental dose-optimization program which includes automated exposure control, adjustment of the mA and/or kV according to patient size and/or use of iterative reconstruction technique. COMPARISON:  MRI head 10/05/2020 FINDINGS: Brain: Cerebral ventricle sizes are concordant with the degree of cerebral volume loss. Patchy and confluent areas of decreased attenuation are noted throughout the deep and periventricular white matter of the cerebral hemispheres bilaterally, compatible with chronic microvascular ischemic disease. No evidence of large-territorial acute infarction. No parenchymal hemorrhage. No mass lesion. No extra-axial collection. No mass effect or midline shift. No hydrocephalus. Basilar cisterns are patent. Vascular: No hyperdense vessel. Atherosclerotic calcifications are present within the cavernous internal carotid and vertebral arteries. Skull: No acute fracture or focal lesion. Sinuses/Orbits: Bilateral maxillary sinus mucosal thickening. Paranasal sinuses and mastoid air cells are clear. The orbits are unremarkable. Other: None.  IMPRESSION: No acute intracranial abnormality. Electronically Signed   By: Tish Frederickson M.D.   On: 06/10/2023 20:52   DG Chest 2 View  Result Date: 06/10/2023 CLINICAL DATA:  Altered mental status EXAM: CHEST - 2 VIEW COMPARISON:  October 05, 2020 FINDINGS: The cardiomediastinal silhouette is unchanged in contour. No pleural effusion. No pneumothorax. Revisualization of bibasilar coarse reticular opacities, overall increased since 2022. More confluent heterogeneous opacity at the RIGHT lung base compared to priors. 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 Gaseous distension of bowel beneath the LEFT hemidiaphragm. IMPRESSION: 1. Increased bibasilar coarse reticular opacities with more confluent heterogeneous opacity at the RIGHT lung base. Findings are favored to reflect a combination of atelectasis and fibrosis. Superimposed infection could appear similar. Electronically Signed   By: Meda Klinefelter M.D.   On: 06/10/2023 13:53   DG Lumbar Spine Complete  Result Date: 06/10/2023 CLINICAL DATA:  pain EXAM: LUMBAR SPINE - COMPLETE 4+ VIEW COMPARISON:  February 03, 2022, PET scan dated Jan 02, 2023 FINDINGS: There are  five non-rib bearing lumbar-type vertebral bodies. Straightening of the lumbar lordosis. Trace anterolisthesis of L3-4. Posterior disc osteophyte complexes throughout the lumbar spine. There is no evidence for acute fracture or subluxation. Revisualization of multiple sclerotic foci most confluent at the T11 vertebral body consistent with known osseous metastatic disease. Increased conspicuity of sclerosis of the T10 vertebral body since 2023, but grossly similar since most recent PET. Multiple sclerotic foci in the pelvis, most confluent in the LEFT pelvis. This is grossly similar to prior PET scan but increased since 2023. Severe intervertebral disc space height loss with severe facet arthropathy and multilevel endplate proliferative changes throughout the lumbar  spine. LEFT-sided double-J stent in expected position over the LEFT renal collecting system. Sacrum is obscured by overlapping bowel contents and wires. Status post LEFT hip arthroplasty with similar lucency abutting the acetabular component. Severe degenerative changes of the RIGHT hip. Atherosclerotic calcifications. IMPRESSION: 1. No definitive acute fracture or subluxation. 2. Severe degenerative changes of the lumbar spine. 3. Revisualization of known osseous metastatic disease. Electronically Signed   By: Meda Klinefelter M.D.   On: 06/10/2023 13:49     Discharge Exam: Vitals:   06/19/23 2027 06/20/23 0509  BP: (!) 170/73 (!) 179/76  Pulse: 78 81  Resp: 16 16  Temp: 97.8 F (36.6 C) (!) 97.4 F (36.3 C)  SpO2: 96% 92%   Vitals:   06/19/23 1315 06/19/23 1438 06/19/23 2027 06/20/23 0509  BP: (!) 175/75 (!) 159/83 (!) 170/73 (!) 179/76  Pulse: 84 83 78 81  Resp: 20  16 16   Temp: 98.9 F (37.2 C)  97.8 F (36.6 C) (!) 97.4 F (36.3 C)  TempSrc: Oral  Oral Oral  SpO2: 95%  96% 92%  Weight:      Height:        General: Pt is alert, awake, not in acute distress Cardiovascular: RRR, S1/S2 +, no rubs, no gallops Respiratory: CTA bilaterally, no wheezing, no rhonchi Abdominal: Soft, NT, ND, bowel sounds + Extremities: no edema, no cyanosis    The results of significant diagnostics from this hospitalization (including imaging, microbiology, ancillary and laboratory) are listed below for reference.     Microbiology: Recent Results (from the past 240 hour(s))  Urine Culture     Status: Abnormal   Collection Time: 06/10/23  4:53 PM   Specimen: Urine, Clean Catch  Result Value Ref Range Status   Specimen Description   Final    URINE, CLEAN CATCH Performed at [facility], [address] [place] [address] [place], [place] [address]    Special Requests   Final    NONE Performed at [facility], [address] [place], [place] [address]    Culture (A)  Final     50,000 COLONIES/mL ENTEROCOCCUS FAECALIS WITHIN MIXED CULTURE Performed at [facility], [address], [place], [place] [address]    Report Status 06/13/2023 FINAL  Final   Organism ID, Bacteria ENTEROCOCCUS FAECALIS (A)  Final      Susceptibility   Enterococcus faecalis - MIC*    AMPICILLIN <=2 SENSITIVE Sensitive     NITROFURANTOIN <=16 SENSITIVE Sensitive     VANCOMYCIN <=0.5 SENSITIVE Sensitive     * 50,000 COLONIES/mL ENTEROCOCCUS FAECALIS  Urine Culture (for pregnant, neutropenic or urologic patients or patients with an indwelling urinary catheter)     Status: Abnormal   Collection Time: 06/11/23  2:14 AM   Specimen: Urine, Clean Catch  Result Value Ref Range Status  Specimen Description   Final    URINE, CLEAN CATCH Performed at [facility], [address] [place], [place] [address]    Special Requests   Final    NONE Performed at [facility], [address] [place], [place] [address]    Culture (A)  Final    <10,000 COLONIES/mL INSIGNIFICANT GROWTH Performed at [facility], [address], [place], [place] [address]    Report Status 06/12/2023 FINAL  Final  Culture, blood (Routine X 2) w Reflex to ID Panel     Status: None   Collection Time: 06/13/23  2:52 PM   Specimen: BLOOD  Result Value Ref Range Status   Specimen Description   Final    BLOOD BLOOD LEFT ARM Performed at [facility], [address], [place], [place] [address]    Special Requests   Final    BOTTLES DRAWN AEROBIC ONLY Blood Culture results may not be optimal due to an inadequate volume of blood received in culture bottles Performed at [facility], [address] [place], [place] [address]    Culture   Final    NO GROWTH 5 DAYS Performed at [facility], [address], [place], [place] [address]    Report Status 06/18/2023 FINAL  Final  Culture, blood (Routine X 2) w Reflex to ID Panel      Status: None   Collection Time: 06/13/23  2:52 PM   Specimen: BLOOD  Result Value Ref Range Status   Specimen Description   Final    BLOOD BLOOD LEFT HAND Performed at [facility], [address] [place], [place] [address]    Special Requests   Final    BOTTLES DRAWN AEROBIC ONLY Blood Culture results may not be optimal due to an inadequate volume of blood received in culture bottles Performed at [facility], [address], [place], [place] [address]    Culture   Final    NO GROWTH 5 DAYS Performed at [facility], [address], [place], [place] [address]    Report Status 06/18/2023 FINAL  Final     Labs: BNP (last 3 results) No results for input(s): "BNP" in the last 8760 hours. Basic Metabolic Panel: Recent Labs  Lab 06/14/23 0907 06/15/23 0959 06/16/23 0458 06/16/23 1931 06/17/23 0751 06/18/23 0524 06/18/23 1125 06/18/23 1714 06/19/23 0548 06/19/23 0948 06/19/23 1637 06/20/23 0541  NA 129* 129* 126*  --    < > 124* 125* 125* 126*  --  128* 127*  K 3.6 3.6 3.6  --    < > 4.0 4.5  --  3.4*  --  4.4 4.0  CL 97* 95* 92*  --    < > 91* 96*  --  95*  --  96* 96*  CO2 23 23 25   --    < > 22 20*  --  21*  --  22 23  GLUCOSE 85 119* 94  --    < > 89 106*  --  84  --  122* 91  BUN 13 12 13   --    < > 9 10  --  8  --  8 9  CREATININE 0.88 1.04 1.08  --    < > 0.78 0.88  --  0.76  --  0.69 0.80  CALCIUM 7.9* 7.5* 7.4*  --    < > 6.6* 6.6*  --  6.4*  --  7.0* 6.9*  MG 2.3 2.2  --   --   --   --   --   --   --   --   --   --   PHOS 1.4* 1.6* 1.3* 2.0*  --  2.0*  --   --   --  1.9*  --  1.9*   < > = values in this interval not displayed.   Liver Function Tests: Recent Labs  Lab 06/15/23 0959  AST 76*  ALT 18  ALKPHOS 231*  BILITOT 0.8  PROT 6.0*  ALBUMIN 2.6*   No results for input(s): "LIPASE", "AMYLASE" in the last 168 hours. No results for input(s): "AMMONIA" in the last 168 hours. CBC: Recent Labs  Lab  06/15/23 0959 06/17/23 0751 06/18/23 0524 06/19/23 0548 06/20/23 0541  WBC 10.3 9.9 9.3 10.1 9.9  HGB 10.9* 9.8* 9.8* 9.6* 9.8*  HCT 32.9* 29.9* 29.9* 26.8* 29.1*  MCV 91.9 92.6 91.7 86.2 90.4  PLT 293 287 306 323 307   Cardiac Enzymes: No results for input(s): "CKTOTAL", "CKMB", "CKMBINDEX", "TROPONINI" in the last 168 hours. BNP: Invalid input(s): "POCBNP" CBG: No results for input(s): "GLUCAP" in the last 168 hours. D-Dimer No results for input(s): "DDIMER" in the last 72 hours. Hgb A1c No results for input(s): "HGBA1C" in the last 72 hours. Lipid Profile No results for input(s): "CHOL", "HDL", "LDLCALC", "TRIG", "CHOLHDL", "LDLDIRECT" in the last 72 hours. Thyroid function studies No results for input(s): "TSH", "T4TOTAL", "T3FREE", "THYROIDAB" in the last 72 hours.  Invalid input(s): "FREET3" Anemia work up No results for input(s): "VITAMINB12", "FOLATE", "FERRITIN", "TIBC", "IRON", "RETICCTPCT" in the last 72 hours. Urinalysis    Component Value Date/Time   COLORURINE YELLOW 06/10/2023 1653   APPEARANCEUR CLOUDY (A) 06/10/2023 1653   LABSPEC 1.020 06/10/2023 1653   PHURINE 5.5 06/10/2023 1653   GLUCOSEU NEGATIVE 06/10/2023 1653   HGBUR LARGE (A) 06/10/2023 1653   BILIRUBINUR NEGATIVE 06/10/2023 1653   KETONESUR NEGATIVE 06/10/2023 1653   PROTEINUR 100 (A) 06/10/2023 1653   UROBILINOGEN 0.2 05/06/2014 1815   NITRITE NEGATIVE 06/10/2023 1653   LEUKOCYTESUR MODERATE (A) 06/10/2023 1653   Sepsis Labs Recent Labs  Lab 06/17/23 0751 06/18/23 0524 06/19/23 0548 06/20/23 0541  WBC 9.9 9.3 10.1 9.9   Microbiology Recent Results (from the past 240 hour(s))  Urine Culture     Status: Abnormal   Collection Time: 06/10/23  4:53 PM   Specimen: Urine, Clean Catch  Result Value Ref Range Status   Specimen Description   Final    URINE, CLEAN CATCH Performed at [facility], [address] [place], [place] [address]    Special Requests   Final     NONE Performed at [facility], [address] [place], [place] [address]    Culture (A)  Final    50,000 COLONIES/mL ENTEROCOCCUS FAECALIS WITHIN MIXED CULTURE Performed at [facility], [address], [place], [place] [address]    Report Status 06/13/2023 FINAL  Final   Organism ID, Bacteria ENTEROCOCCUS FAECALIS (A)  Final      Susceptibility   Enterococcus faecalis - MIC*    AMPICILLIN <=2 SENSITIVE Sensitive     NITROFURANTOIN <=16 SENSITIVE Sensitive     VANCOMYCIN <=0.5 SENSITIVE Sensitive     * 50,000 COLONIES/mL ENTEROCOCCUS FAECALIS  Urine Culture (for pregnant, neutropenic or urologic patients or patients with an indwelling urinary catheter)     Status:  Abnormal   Collection Time: 06/11/23  2:14 AM   Specimen: Urine, Clean Catch  Result Value Ref Range Status   Specimen Description   Final    URINE, CLEAN CATCH Performed at [facility], [address], [place], [place] [address]    Special Requests   Final    NONE Performed at [facility], [address], [place], [place] [address]    Culture (A)  Final    <10,000 COLONIES/mL INSIGNIFICANT GROWTH Performed at [facility], [address], [place], [place] [address]    Report Status 06/12/2023 FINAL  Final  Culture, blood (Routine X 2) w Reflex to ID Panel     Status: None   Collection Time: 06/13/23  2:52 PM   Specimen: BLOOD  Result Value Ref Range Status   Specimen Description   Final    BLOOD BLOOD LEFT ARM Performed at [facility], [address] [place], [place] [address]    Special Requests   Final    BOTTLES DRAWN AEROBIC ONLY Blood Culture results may not be optimal due to an inadequate volume of blood received in culture bottles Performed at [facility], [address], [place], [place] [address]    Culture   Final    NO GROWTH 5 DAYS Performed at [facility], [address]  [place], [place] [address]    Report Status 06/18/2023 FINAL  Final  Culture, blood (Routine X 2) w Reflex to ID Panel     Status: None   Collection Time: 06/13/23  2:52 PM   Specimen: BLOOD  Result Value Ref Range Status   Specimen Description   Final    BLOOD BLOOD LEFT HAND Performed at [facility], [address], [place], [place] [address]    Special Requests   Final    BOTTLES DRAWN AEROBIC ONLY Blood Culture results may not be optimal due to an inadequate volume of blood received in culture bottles Performed at [facility], [address], [place], [place] [address]    Culture   Final    NO GROWTH 5 DAYS Performed at [facility], [address], [place], [place] [address]    Report Status 06/18/2023 FINAL  Final    FURTHER DISCHARGE INSTRUCTIONS:   Get Medicines reviewed and adjusted: Please take all your medications with you for your next visit with your Primary MD   Laboratory/radiological data: Please request your Primary MD to go over all hospital tests and procedure/radiological results at the follow up, please ask your Primary MD to get all Hospital records sent to his/her office.   In some cases, they will be blood work, cultures and biopsy results pending at the time of your discharge. Please request that your primary care M.D. goes through all the records of your hospital data and follows up on these results.   Also Note the following: If you experience worsening of your admission symptoms, develop shortness of breath, life threatening emergency, suicidal or homicidal thoughts you must seek medical attention immediately by calling 911 or calling your MD immediately  if symptoms less severe.   You must read complete instructions/literature along with all the possible adverse reactions/side effects for all the Medicines you take and that have been prescribed to you. Take any new Medicines after you have completely understood  and accpet all the possible adverse reactions/side effects.    Do not drive  when taking Pain medications or sleeping medications (Benzodaizepines)   Do not take more than prescribed Pain, Sleep and Anxiety Medications. It is not advisable to combine anxiety,sleep and pain medications without talking with your primary care practitioner   Special Instructions: If you have smoked or chewed Tobacco  in the last 2 yrs please stop smoking, stop any regular Alcohol  and or any Recreational drug use.   Wear Seat belts while driving.   Please note: You were cared for by a hospitalist during your hospital stay. Once you are discharged, your primary care physician will handle any further medical issues. Please note that NO REFILLS for any discharge medications will be authorized once you are discharged, as it is imperative that you return to your primary care physician (or establish a relationship with a primary care physician if you do not have one) for your post hospital discharge needs so that they can reassess your need for medications and monitor your lab values  Time coordinating discharge: Over 30 minutes  SIGNED:   Hughie Closs, MD  [facility] 06/20/2023, 11:15 AM *Please note that this is a verbal dictation therefore any spelling or grammatical errors are due to the "Dragon [employer] One" system interpretation. If 7PM-7AM, please contact night-coverage [email or web address]

## 2023-06-20 NOTE — Plan of Care (Signed)
Gave pt and wife discharge instructions. Both verbalized understanding. Waiting on [facility] for transport.  Problem: Education: Goal: Knowledge of General Education information will improve Description: Including pain rating scale, medication(s)/side effects and non-pharmacologic comfort measures Outcome: Adequate for Discharge   Problem: Health Behavior/Discharge Planning: Goal: Ability to manage health-related needs will improve Outcome: Adequate for Discharge   Problem: Clinical Measurements: Goal: Ability to maintain clinical measurements within normal limits will improve Outcome: Adequate for Discharge Goal: Will remain free from infection Outcome: Adequate for Discharge Goal: Diagnostic test results will improve Outcome: Adequate for Discharge Goal: Respiratory complications will improve Outcome: Adequate for Discharge Goal: Cardiovascular complication will be avoided Outcome: Adequate for Discharge   Problem: Activity: Goal: Risk for activity intolerance will decrease Outcome: Adequate for Discharge   Problem: Nutrition: Goal: Adequate nutrition will be maintained Outcome: Adequate for Discharge   Problem: Coping: Goal: Level of anxiety will decrease Outcome: Adequate for Discharge   Problem: Elimination: Goal: Will not experience complications related to bowel motility Outcome: Adequate for Discharge Goal: Will not experience complications related to urinary retention Outcome: Adequate for Discharge   Problem: Pain Management: Goal: General experience of comfort will improve Outcome: Adequate for Discharge   Problem: Safety: Goal: Ability to remain free from injury will improve Outcome: Adequate for Discharge   Problem: Skin Integrity: Goal: Risk for impaired skin integrity will decrease Outcome: Adequate for Discharge

## 2023-06-20 NOTE — Plan of Care (Signed)
  Problem: Education: Goal: Knowledge of General Education information will improve Description: Including pain rating scale, medication(s)/side effects and non-pharmacologic comfort measures Outcome: Adequate for Discharge   Problem: Health Behavior/Discharge Planning: Goal: Ability to manage health-related needs will improve Outcome: Adequate for Discharge   Problem: Clinical Measurements: Goal: Ability to maintain clinical measurements within normal limits will improve Outcome: Adequate for Discharge Goal: Will remain free from infection Outcome: Adequate for Discharge Goal: Diagnostic test results will improve Outcome: Adequate for Discharge Goal: Respiratory complications will improve Outcome: Adequate for Discharge Goal: Cardiovascular complication will be avoided Outcome: Adequate for Discharge   Problem: Activity: Goal: Risk for activity intolerance will decrease Outcome: Adequate for Discharge   Problem: Nutrition: Goal: Adequate nutrition will be maintained Outcome: Adequate for Discharge   Problem: Coping: Goal: Level of anxiety will decrease Outcome: Adequate for Discharge   Problem: Elimination: Goal: Will not experience complications related to bowel motility Outcome: Adequate for Discharge Goal: Will not experience complications related to urinary retention Outcome: Adequate for Discharge   Problem: Pain Management: Goal: General experience of comfort will improve Outcome: Adequate for Discharge   Problem: Safety: Goal: Ability to remain free from injury will improve Outcome: Adequate for Discharge   Problem: Skin Integrity: Goal: Risk for impaired skin integrity will decrease Outcome: Adequate for Discharge   Problem: Acute Rehab PT Goals(only PT should resolve) Goal: Pt Will Go Supine/Side To Sit Outcome: Adequate for Discharge Goal: Patient Will Transfer Sit To/From Stand Outcome: Adequate for Discharge Goal: Pt Will Ambulate Outcome:  Adequate for Discharge   Problem: Increased Nutrient Needs (NI-5.1) Goal: Food and/or nutrient delivery Description: Individualized approach for food/nutrient provision. Outcome: Adequate for Discharge   Problem: Acute Rehab OT Goals (only OT should resolve) Goal: Pt. Will Perform Grooming Outcome: Adequate for Discharge Goal: Pt. Will Transfer To Toilet Outcome: Adequate for Discharge Goal: Pt. Will Perform Toileting-Clothing Manipulation Outcome: Adequate for Discharge   Problem: Acute Rehab PT Goals(only PT should resolve) Goal: Pt Will Go Supine/Side To Sit Outcome: Adequate for Discharge Goal: Pt Will Go Sit To Supine/Side Outcome: Adequate for Discharge Goal: Patient Will Transfer Sit To/From Stand Outcome: Adequate for Discharge Goal: Pt Will Transfer Bed To Chair/Chair To Bed Outcome: Adequate for Discharge Goal: Pt Will Ambulate Outcome: Adequate for Discharge Goal: Pt/caregiver will Perform Home Exercise Program Outcome: Adequate for Discharge

## 2023-06-22 DIAGNOSIS — N1832 Chronic kidney disease, stage 3b: Secondary | ICD-10-CM | POA: Diagnosis not present

## 2023-06-22 DIAGNOSIS — K2100 Gastro-esophageal reflux disease with esophagitis, without bleeding: Secondary | ICD-10-CM | POA: Diagnosis not present

## 2023-06-22 DIAGNOSIS — I10 Essential (primary) hypertension: Secondary | ICD-10-CM | POA: Diagnosis not present

## 2023-06-22 DIAGNOSIS — N400 Enlarged prostate without lower urinary tract symptoms: Secondary | ICD-10-CM | POA: Diagnosis not present

## 2023-06-22 DIAGNOSIS — D649 Anemia, unspecified: Secondary | ICD-10-CM | POA: Diagnosis not present

## 2023-06-22 DIAGNOSIS — C799 Secondary malignant neoplasm of unspecified site: Secondary | ICD-10-CM | POA: Diagnosis not present

## 2023-06-22 DIAGNOSIS — E039 Hypothyroidism, unspecified: Secondary | ICD-10-CM | POA: Diagnosis not present

## 2023-06-22 DIAGNOSIS — C61 Malignant neoplasm of prostate: Secondary | ICD-10-CM | POA: Diagnosis not present

## 2023-06-22 DIAGNOSIS — J449 Chronic obstructive pulmonary disease, unspecified: Secondary | ICD-10-CM | POA: Diagnosis not present

## 2023-06-22 DIAGNOSIS — Z8673 Personal history of transient ischemic attack (TIA), and cerebral infarction without residual deficits: Secondary | ICD-10-CM | POA: Diagnosis not present

## 2023-06-25 DIAGNOSIS — N1832 Chronic kidney disease, stage 3b: Secondary | ICD-10-CM | POA: Diagnosis not present

## 2023-06-25 DIAGNOSIS — Z8673 Personal history of transient ischemic attack (TIA), and cerebral infarction without residual deficits: Secondary | ICD-10-CM | POA: Diagnosis not present

## 2023-06-25 DIAGNOSIS — C61 Malignant neoplasm of prostate: Secondary | ICD-10-CM | POA: Diagnosis not present

## 2023-06-25 DIAGNOSIS — N400 Enlarged prostate without lower urinary tract symptoms: Secondary | ICD-10-CM | POA: Diagnosis not present

## 2023-06-25 DIAGNOSIS — E039 Hypothyroidism, unspecified: Secondary | ICD-10-CM | POA: Diagnosis not present

## 2023-06-25 DIAGNOSIS — I10 Essential (primary) hypertension: Secondary | ICD-10-CM | POA: Diagnosis not present

## 2023-06-25 DIAGNOSIS — J449 Chronic obstructive pulmonary disease, unspecified: Secondary | ICD-10-CM | POA: Diagnosis not present

## 2023-06-25 DIAGNOSIS — K2100 Gastro-esophageal reflux disease with esophagitis, without bleeding: Secondary | ICD-10-CM | POA: Diagnosis not present

## 2023-06-25 DIAGNOSIS — C799 Secondary malignant neoplasm of unspecified site: Secondary | ICD-10-CM | POA: Diagnosis not present

## 2023-06-25 DIAGNOSIS — D649 Anemia, unspecified: Secondary | ICD-10-CM | POA: Diagnosis not present

## 2023-06-26 ENCOUNTER — Other Ambulatory Visit (HOSPITAL_COMMUNITY): Payer: Medicare Other

## 2023-06-26 DIAGNOSIS — Z8673 Personal history of transient ischemic attack (TIA), and cerebral infarction without residual deficits: Secondary | ICD-10-CM | POA: Diagnosis not present

## 2023-06-26 DIAGNOSIS — J449 Chronic obstructive pulmonary disease, unspecified: Secondary | ICD-10-CM | POA: Diagnosis not present

## 2023-06-26 DIAGNOSIS — C61 Malignant neoplasm of prostate: Secondary | ICD-10-CM | POA: Diagnosis not present

## 2023-06-26 DIAGNOSIS — I10 Essential (primary) hypertension: Secondary | ICD-10-CM | POA: Diagnosis not present

## 2023-06-26 DIAGNOSIS — C799 Secondary malignant neoplasm of unspecified site: Secondary | ICD-10-CM | POA: Diagnosis not present

## 2023-06-26 DIAGNOSIS — N400 Enlarged prostate without lower urinary tract symptoms: Secondary | ICD-10-CM | POA: Diagnosis not present

## 2023-06-28 DIAGNOSIS — J449 Chronic obstructive pulmonary disease, unspecified: Secondary | ICD-10-CM | POA: Diagnosis not present

## 2023-06-28 DIAGNOSIS — C61 Malignant neoplasm of prostate: Secondary | ICD-10-CM | POA: Diagnosis not present

## 2023-06-28 DIAGNOSIS — N400 Enlarged prostate without lower urinary tract symptoms: Secondary | ICD-10-CM | POA: Diagnosis not present

## 2023-06-28 DIAGNOSIS — C799 Secondary malignant neoplasm of unspecified site: Secondary | ICD-10-CM | POA: Diagnosis not present

## 2023-06-28 DIAGNOSIS — Z8673 Personal history of transient ischemic attack (TIA), and cerebral infarction without residual deficits: Secondary | ICD-10-CM | POA: Diagnosis not present

## 2023-06-28 DIAGNOSIS — I10 Essential (primary) hypertension: Secondary | ICD-10-CM | POA: Diagnosis not present

## 2023-06-29 DIAGNOSIS — C61 Malignant neoplasm of prostate: Secondary | ICD-10-CM | POA: Diagnosis not present

## 2023-06-29 DIAGNOSIS — Z8673 Personal history of transient ischemic attack (TIA), and cerebral infarction without residual deficits: Secondary | ICD-10-CM | POA: Diagnosis not present

## 2023-06-29 DIAGNOSIS — J449 Chronic obstructive pulmonary disease, unspecified: Secondary | ICD-10-CM | POA: Diagnosis not present

## 2023-06-29 DIAGNOSIS — I10 Essential (primary) hypertension: Secondary | ICD-10-CM | POA: Diagnosis not present

## 2023-06-29 DIAGNOSIS — C799 Secondary malignant neoplasm of unspecified site: Secondary | ICD-10-CM | POA: Diagnosis not present

## 2023-06-29 DIAGNOSIS — N400 Enlarged prostate without lower urinary tract symptoms: Secondary | ICD-10-CM | POA: Diagnosis not present

## 2023-07-02 DIAGNOSIS — C61 Malignant neoplasm of prostate: Secondary | ICD-10-CM | POA: Diagnosis not present

## 2023-07-02 DIAGNOSIS — C799 Secondary malignant neoplasm of unspecified site: Secondary | ICD-10-CM | POA: Diagnosis not present

## 2023-07-02 DIAGNOSIS — N400 Enlarged prostate without lower urinary tract symptoms: Secondary | ICD-10-CM | POA: Diagnosis not present

## 2023-07-02 DIAGNOSIS — Z8673 Personal history of transient ischemic attack (TIA), and cerebral infarction without residual deficits: Secondary | ICD-10-CM | POA: Diagnosis not present

## 2023-07-02 DIAGNOSIS — J449 Chronic obstructive pulmonary disease, unspecified: Secondary | ICD-10-CM | POA: Diagnosis not present

## 2023-07-02 DIAGNOSIS — I10 Essential (primary) hypertension: Secondary | ICD-10-CM | POA: Diagnosis not present

## 2023-07-03 DIAGNOSIS — C61 Malignant neoplasm of prostate: Secondary | ICD-10-CM | POA: Diagnosis not present

## 2023-07-03 DIAGNOSIS — I10 Essential (primary) hypertension: Secondary | ICD-10-CM | POA: Diagnosis not present

## 2023-07-03 DIAGNOSIS — N400 Enlarged prostate without lower urinary tract symptoms: Secondary | ICD-10-CM | POA: Diagnosis not present

## 2023-07-03 DIAGNOSIS — C799 Secondary malignant neoplasm of unspecified site: Secondary | ICD-10-CM | POA: Diagnosis not present

## 2023-07-03 DIAGNOSIS — Z8673 Personal history of transient ischemic attack (TIA), and cerebral infarction without residual deficits: Secondary | ICD-10-CM | POA: Diagnosis not present

## 2023-07-03 DIAGNOSIS — J449 Chronic obstructive pulmonary disease, unspecified: Secondary | ICD-10-CM | POA: Diagnosis not present

## 2023-07-05 DIAGNOSIS — Z8673 Personal history of transient ischemic attack (TIA), and cerebral infarction without residual deficits: Secondary | ICD-10-CM | POA: Diagnosis not present

## 2023-07-05 DIAGNOSIS — N400 Enlarged prostate without lower urinary tract symptoms: Secondary | ICD-10-CM | POA: Diagnosis not present

## 2023-07-05 DIAGNOSIS — I10 Essential (primary) hypertension: Secondary | ICD-10-CM | POA: Diagnosis not present

## 2023-07-05 DIAGNOSIS — C61 Malignant neoplasm of prostate: Secondary | ICD-10-CM | POA: Diagnosis not present

## 2023-07-05 DIAGNOSIS — J449 Chronic obstructive pulmonary disease, unspecified: Secondary | ICD-10-CM | POA: Diagnosis not present

## 2023-07-05 DIAGNOSIS — C799 Secondary malignant neoplasm of unspecified site: Secondary | ICD-10-CM | POA: Diagnosis not present

## 2023-07-10 DIAGNOSIS — N400 Enlarged prostate without lower urinary tract symptoms: Secondary | ICD-10-CM | POA: Diagnosis not present

## 2023-07-10 DIAGNOSIS — C799 Secondary malignant neoplasm of unspecified site: Secondary | ICD-10-CM | POA: Diagnosis not present

## 2023-07-10 DIAGNOSIS — C61 Malignant neoplasm of prostate: Secondary | ICD-10-CM | POA: Diagnosis not present

## 2023-07-10 DIAGNOSIS — Z8673 Personal history of transient ischemic attack (TIA), and cerebral infarction without residual deficits: Secondary | ICD-10-CM | POA: Diagnosis not present

## 2023-07-10 DIAGNOSIS — J449 Chronic obstructive pulmonary disease, unspecified: Secondary | ICD-10-CM | POA: Diagnosis not present

## 2023-07-10 DIAGNOSIS — I10 Essential (primary) hypertension: Secondary | ICD-10-CM | POA: Diagnosis not present

## 2023-07-15 DIAGNOSIS — Z8673 Personal history of transient ischemic attack (TIA), and cerebral infarction without residual deficits: Secondary | ICD-10-CM | POA: Diagnosis not present

## 2023-07-15 DIAGNOSIS — C61 Malignant neoplasm of prostate: Secondary | ICD-10-CM | POA: Diagnosis not present

## 2023-07-15 DIAGNOSIS — K2100 Gastro-esophageal reflux disease with esophagitis, without bleeding: Secondary | ICD-10-CM | POA: Diagnosis not present

## 2023-07-15 DIAGNOSIS — C799 Secondary malignant neoplasm of unspecified site: Secondary | ICD-10-CM | POA: Diagnosis not present

## 2023-07-15 DIAGNOSIS — N1832 Chronic kidney disease, stage 3b: Secondary | ICD-10-CM | POA: Diagnosis not present

## 2023-07-15 DIAGNOSIS — N400 Enlarged prostate without lower urinary tract symptoms: Secondary | ICD-10-CM | POA: Diagnosis not present

## 2023-07-15 DIAGNOSIS — I10 Essential (primary) hypertension: Secondary | ICD-10-CM | POA: Diagnosis not present

## 2023-07-15 DIAGNOSIS — E039 Hypothyroidism, unspecified: Secondary | ICD-10-CM | POA: Diagnosis not present

## 2023-07-15 DIAGNOSIS — D649 Anemia, unspecified: Secondary | ICD-10-CM | POA: Diagnosis not present

## 2023-07-15 DIAGNOSIS — J449 Chronic obstructive pulmonary disease, unspecified: Secondary | ICD-10-CM | POA: Diagnosis not present

## 2023-07-17 DIAGNOSIS — N400 Enlarged prostate without lower urinary tract symptoms: Secondary | ICD-10-CM | POA: Diagnosis not present

## 2023-07-17 DIAGNOSIS — J449 Chronic obstructive pulmonary disease, unspecified: Secondary | ICD-10-CM | POA: Diagnosis not present

## 2023-07-17 DIAGNOSIS — C61 Malignant neoplasm of prostate: Secondary | ICD-10-CM | POA: Diagnosis not present

## 2023-07-17 DIAGNOSIS — Z8673 Personal history of transient ischemic attack (TIA), and cerebral infarction without residual deficits: Secondary | ICD-10-CM | POA: Diagnosis not present

## 2023-07-17 DIAGNOSIS — I10 Essential (primary) hypertension: Secondary | ICD-10-CM | POA: Diagnosis not present

## 2023-07-17 DIAGNOSIS — C799 Secondary malignant neoplasm of unspecified site: Secondary | ICD-10-CM | POA: Diagnosis not present

## 2023-07-19 DIAGNOSIS — Z8673 Personal history of transient ischemic attack (TIA), and cerebral infarction without residual deficits: Secondary | ICD-10-CM | POA: Diagnosis not present

## 2023-07-19 DIAGNOSIS — N400 Enlarged prostate without lower urinary tract symptoms: Secondary | ICD-10-CM | POA: Diagnosis not present

## 2023-07-19 DIAGNOSIS — C799 Secondary malignant neoplasm of unspecified site: Secondary | ICD-10-CM | POA: Diagnosis not present

## 2023-07-19 DIAGNOSIS — I10 Essential (primary) hypertension: Secondary | ICD-10-CM | POA: Diagnosis not present

## 2023-07-19 DIAGNOSIS — C61 Malignant neoplasm of prostate: Secondary | ICD-10-CM | POA: Diagnosis not present

## 2023-07-19 DIAGNOSIS — J449 Chronic obstructive pulmonary disease, unspecified: Secondary | ICD-10-CM | POA: Diagnosis not present

## 2023-07-22 DIAGNOSIS — C799 Secondary malignant neoplasm of unspecified site: Secondary | ICD-10-CM | POA: Diagnosis not present

## 2023-07-22 DIAGNOSIS — C61 Malignant neoplasm of prostate: Secondary | ICD-10-CM | POA: Diagnosis not present

## 2023-07-22 DIAGNOSIS — Z8673 Personal history of transient ischemic attack (TIA), and cerebral infarction without residual deficits: Secondary | ICD-10-CM | POA: Diagnosis not present

## 2023-07-22 DIAGNOSIS — I10 Essential (primary) hypertension: Secondary | ICD-10-CM | POA: Diagnosis not present

## 2023-07-22 DIAGNOSIS — J449 Chronic obstructive pulmonary disease, unspecified: Secondary | ICD-10-CM | POA: Diagnosis not present

## 2023-07-22 DIAGNOSIS — N400 Enlarged prostate without lower urinary tract symptoms: Secondary | ICD-10-CM | POA: Diagnosis not present

## 2023-07-23 DIAGNOSIS — I10 Essential (primary) hypertension: Secondary | ICD-10-CM | POA: Diagnosis not present

## 2023-07-23 DIAGNOSIS — Z8673 Personal history of transient ischemic attack (TIA), and cerebral infarction without residual deficits: Secondary | ICD-10-CM | POA: Diagnosis not present

## 2023-07-23 DIAGNOSIS — C799 Secondary malignant neoplasm of unspecified site: Secondary | ICD-10-CM | POA: Diagnosis not present

## 2023-07-23 DIAGNOSIS — C61 Malignant neoplasm of prostate: Secondary | ICD-10-CM | POA: Diagnosis not present

## 2023-07-23 DIAGNOSIS — J449 Chronic obstructive pulmonary disease, unspecified: Secondary | ICD-10-CM | POA: Diagnosis not present

## 2023-07-23 DIAGNOSIS — N400 Enlarged prostate without lower urinary tract symptoms: Secondary | ICD-10-CM | POA: Diagnosis not present

## 2023-08-15 DEATH — deceased
